# Patient Record
Sex: Male | Born: 1950 | Race: White | Hispanic: No | Marital: Married | Smoking: Never smoker
Health system: Southern US, Community
[De-identification: ages and names within clinical notes are randomized; demographics above are authoritative.]

## PROBLEM LIST (undated history)

## (undated) DIAGNOSIS — N183 Chronic kidney disease, stage 3 unspecified: Secondary | ICD-10-CM

## (undated) DIAGNOSIS — D61818 Other pancytopenia: Secondary | ICD-10-CM

## (undated) DIAGNOSIS — R569 Unspecified convulsions: Secondary | ICD-10-CM

## (undated) DIAGNOSIS — I350 Nonrheumatic aortic (valve) stenosis: Secondary | ICD-10-CM

## (undated) DIAGNOSIS — I5043 Acute on chronic combined systolic (congestive) and diastolic (congestive) heart failure: Secondary | ICD-10-CM

## (undated) DIAGNOSIS — I10 Essential (primary) hypertension: Secondary | ICD-10-CM

## (undated) DIAGNOSIS — N179 Acute kidney failure, unspecified: Secondary | ICD-10-CM

## (undated) DIAGNOSIS — I5042 Chronic combined systolic (congestive) and diastolic (congestive) heart failure: Secondary | ICD-10-CM

## (undated) DIAGNOSIS — M549 Dorsalgia, unspecified: Secondary | ICD-10-CM

## (undated) DIAGNOSIS — J90 Pleural effusion, not elsewhere classified: Secondary | ICD-10-CM

## (undated) DIAGNOSIS — G8929 Other chronic pain: Secondary | ICD-10-CM

## (undated) DIAGNOSIS — E875 Hyperkalemia: Secondary | ICD-10-CM

## (undated) DIAGNOSIS — R9431 Abnormal electrocardiogram [ECG] [EKG]: Secondary | ICD-10-CM

## (undated) DIAGNOSIS — I442 Atrioventricular block, complete: Secondary | ICD-10-CM

## (undated) DIAGNOSIS — E1140 Type 2 diabetes mellitus with diabetic neuropathy, unspecified: Secondary | ICD-10-CM

## (undated) DIAGNOSIS — M542 Cervicalgia: Secondary | ICD-10-CM

## (undated) DIAGNOSIS — M069 Rheumatoid arthritis, unspecified: Secondary | ICD-10-CM

## (undated) DIAGNOSIS — I2510 Atherosclerotic heart disease of native coronary artery without angina pectoris: Secondary | ICD-10-CM

## (undated) HISTORY — PX: CARDIAC SURGERY: SHX584

## (undated) HISTORY — PX: LEG AMPUTATION: SHX1105

## (undated) HISTORY — DX: Chronic combined systolic (congestive) and diastolic (congestive) heart failure: I50.42

## (undated) HISTORY — PX: OTHER SURGICAL HISTORY: SHX169

## (undated) HISTORY — PX: TOTAL HIP ARTHROPLASTY: SHX124

## (undated) HISTORY — PX: JOINT REPLACEMENT: SHX530

## (undated) HISTORY — DX: Nonrheumatic aortic (valve) stenosis: I35.0

## (undated) HISTORY — PX: BELOW KNEE LEG AMPUTATION: SUR23

## (undated) HISTORY — DX: Chronic kidney disease, stage 3 unspecified: N18.30

## (undated) HISTORY — PX: FRACTURE SURGERY: SHX138

---
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
## 1991-12-27 DIAGNOSIS — I2510 Atherosclerotic heart disease of native coronary artery without angina pectoris: Secondary | ICD-10-CM

## 1991-12-27 HISTORY — DX: Atherosclerotic heart disease of native coronary artery without angina pectoris: I25.10

## 1998-07-06 ENCOUNTER — Ambulatory Visit (HOSPITAL_COMMUNITY): Admission: RE | Admit: 1998-07-06 | Discharge: 1998-07-06 | Payer: Self-pay | Admitting: *Deleted

## 1998-08-13 ENCOUNTER — Ambulatory Visit (HOSPITAL_COMMUNITY): Admission: RE | Admit: 1998-08-13 | Discharge: 1998-08-13 | Payer: Self-pay | Admitting: *Deleted

## 1998-08-14 ENCOUNTER — Ambulatory Visit (HOSPITAL_COMMUNITY): Admission: RE | Admit: 1998-08-14 | Discharge: 1998-08-14 | Payer: Self-pay | Admitting: *Deleted

## 1999-09-11 ENCOUNTER — Encounter: Payer: Self-pay | Admitting: Emergency Medicine

## 1999-09-11 ENCOUNTER — Inpatient Hospital Stay (HOSPITAL_COMMUNITY): Admission: EM | Admit: 1999-09-11 | Discharge: 1999-09-15 | Payer: Self-pay | Admitting: Emergency Medicine

## 1999-09-11 ENCOUNTER — Encounter: Payer: Self-pay | Admitting: Endocrinology

## 1999-11-24 ENCOUNTER — Encounter (HOSPITAL_COMMUNITY): Admission: RE | Admit: 1999-11-24 | Discharge: 2000-02-22 | Payer: Self-pay

## 2000-01-18 ENCOUNTER — Inpatient Hospital Stay (HOSPITAL_COMMUNITY): Admission: EM | Admit: 2000-01-18 | Discharge: 2000-01-21 | Payer: Self-pay | Admitting: Emergency Medicine

## 2000-01-18 ENCOUNTER — Encounter: Payer: Self-pay | Admitting: Emergency Medicine

## 2000-02-16 ENCOUNTER — Ambulatory Visit (HOSPITAL_COMMUNITY): Admission: RE | Admit: 2000-02-16 | Discharge: 2000-02-16 | Payer: Self-pay

## 2000-02-18 ENCOUNTER — Inpatient Hospital Stay (HOSPITAL_COMMUNITY): Admission: AD | Admit: 2000-02-18 | Discharge: 2000-03-02 | Payer: Self-pay

## 2000-04-03 ENCOUNTER — Encounter: Admission: RE | Admit: 2000-04-03 | Discharge: 2000-04-03 | Payer: Self-pay | Admitting: Infectious Diseases

## 2000-04-19 ENCOUNTER — Encounter: Admission: RE | Admit: 2000-04-19 | Discharge: 2000-04-19 | Payer: Self-pay | Admitting: Infectious Diseases

## 2000-05-04 ENCOUNTER — Ambulatory Visit (HOSPITAL_COMMUNITY): Admission: RE | Admit: 2000-05-04 | Discharge: 2000-05-04 | Payer: Self-pay

## 2000-06-05 ENCOUNTER — Encounter: Admission: RE | Admit: 2000-06-05 | Discharge: 2000-06-05 | Payer: Self-pay | Admitting: Infectious Diseases

## 2000-07-03 ENCOUNTER — Encounter: Admission: RE | Admit: 2000-07-03 | Discharge: 2000-07-03 | Payer: Self-pay | Admitting: Infectious Diseases

## 2000-10-16 ENCOUNTER — Encounter: Admission: RE | Admit: 2000-10-16 | Discharge: 2001-01-14 | Payer: Self-pay

## 2001-03-25 ENCOUNTER — Encounter: Payer: Self-pay | Admitting: Emergency Medicine

## 2001-03-26 ENCOUNTER — Inpatient Hospital Stay (HOSPITAL_COMMUNITY): Admission: EM | Admit: 2001-03-26 | Discharge: 2001-03-28 | Payer: Self-pay | Admitting: Emergency Medicine

## 2001-04-06 ENCOUNTER — Encounter: Admission: RE | Admit: 2001-04-06 | Discharge: 2001-04-06 | Payer: Self-pay | Admitting: Infectious Diseases

## 2002-03-09 ENCOUNTER — Inpatient Hospital Stay (HOSPITAL_COMMUNITY): Admission: EM | Admit: 2002-03-09 | Discharge: 2002-03-14 | Payer: Self-pay | Admitting: Endocrinology

## 2002-03-09 ENCOUNTER — Encounter: Payer: Self-pay | Admitting: Emergency Medicine

## 2002-10-01 ENCOUNTER — Encounter: Payer: Self-pay | Admitting: Emergency Medicine

## 2002-10-01 ENCOUNTER — Emergency Department (HOSPITAL_COMMUNITY): Admission: EM | Admit: 2002-10-01 | Discharge: 2002-10-01 | Payer: Self-pay | Admitting: Emergency Medicine

## 2008-07-03 ENCOUNTER — Ambulatory Visit (HOSPITAL_COMMUNITY): Admission: RE | Admit: 2008-07-03 | Discharge: 2008-07-03 | Payer: Self-pay | Admitting: Family Medicine

## 2008-07-10 ENCOUNTER — Ambulatory Visit (HOSPITAL_COMMUNITY): Admission: RE | Admit: 2008-07-10 | Discharge: 2008-07-10 | Payer: Self-pay | Admitting: Family Medicine

## 2008-07-10 ENCOUNTER — Encounter (INDEPENDENT_AMBULATORY_CARE_PROVIDER_SITE_OTHER): Payer: Self-pay | Admitting: Family Medicine

## 2008-10-22 ENCOUNTER — Ambulatory Visit: Payer: Self-pay | Admitting: Gastroenterology

## 2008-10-27 ENCOUNTER — Ambulatory Visit (HOSPITAL_COMMUNITY): Admission: RE | Admit: 2008-10-27 | Discharge: 2008-10-27 | Payer: Self-pay | Admitting: Gastroenterology

## 2008-10-27 ENCOUNTER — Ambulatory Visit: Payer: Self-pay | Admitting: Gastroenterology

## 2009-05-08 ENCOUNTER — Emergency Department (HOSPITAL_COMMUNITY): Admission: EM | Admit: 2009-05-08 | Discharge: 2009-05-09 | Payer: Self-pay | Admitting: Emergency Medicine

## 2011-05-10 NOTE — Op Note (Signed)
NAME:  [name], [name] NO.:  [id number]   MEDICAL RECORD NO.:  [id number]          PATIENT TYPE:  AMB   LOCATION:  DAY                           FACILITY:  [facility]   PHYSICIAN:  Caro Hight, M.D.      DATE OF BIRTH:  April 02, 1951   DATE OF PROCEDURE:  10/27/2008  DATE OF DISCHARGE:                               OPERATIVE REPORT   REFERRING PHYSICIAN:  Halford Chessman, MD   PROCEDURE:  Colonoscopy.   INDICATIONS FOR EXAM:  Mr. Alston is a 60 year old with years of  constipation.  He is currently on narcotics for his pain from rheumatoid  arthritis.  He presents for average-risk colon cancer screening.   FINDINGS:  1. Slightly tortuous colon.  Otherwise, normal colon without evidence      of polyps, masses, inflammatory changes or diverticular AVMs.  2. Small internal hemorrhoids.  Otherwise, normal retroflexed view of      the rectum.   RECOMMENDATIONS:  1. Screening colonoscopy in 10 years.  2. He should drink 6-8 cups of water daily.  He should use Benefiber      twice a day.  He can continue to use milk of magnesia every other      day to address his constipation.  3. He should follow a high-fiber diet.  He is given a handout on high-      fiber diet and hemorrhoids.   MEDICATIONS:  MAC provided by Anesthesia.   PROCEDURE TECHNIQUE:  Physical exam was performed.  Informed consent was  obtained from the patient after explaining the benefits, risks, and  alternatives to the procedure.  The patient was connected to monitor and  placed in left lateral position.  Continuous oxygen was provided by  nasal cannula.  IV medicine administered through an indwelling cannula.  After administration of sedation and rectal exam, the patient's rectum  was intubated.  The scope  was advanced under direct visualization to the cecum.  The scope was  removed slowly by carefully examining the color, texture, anatomy, and  integrity of the mucosa on the way out.  The patient was  recovered in  Endoscopy and discharged home in satisfactory condition.      Caro Hight, M.D.  Electronically Signed     SM/MEDQ  D:  10/27/2008  T:  10/28/2008  Job:  [id number]   cc:   Halford Chessman, M.D.  Fax: [phone]

## 2011-05-10 NOTE — Consult Note (Signed)
NAME:  [name], MACHIDA NO.:  [id number]   MEDICAL RECORD NO.:  [id number]          PATIENT TYPE:  AMB   LOCATION:  DAY                           FACILITY:  [facility]   PHYSICIAN:  Caro Hight, M.D.      DATE OF BIRTH:  [birth year]   DATE OF CONSULTATION:  DATE OF DISCHARGE:                                 CONSULTATION   REASON FOR CONSULTATION:  Constipation.   PHYSICIAN REQUESTING CONSULTATION:  Halford Chessman, MD   HISTORY OF PRESENT ILLNESS:  Mr. Carion is a 59 year old Caucasian  gentleman who presents today for further evaluation of chronic  constipation.  He states he has really had a problem over the past 1  year.  He never has a bowel movement unless he takes milk of magnesia,  which he usually takes every 2-3 days.  He has tried multiple over-the-  Garment/textile technologist.  He has tried MiraLax without any results.  He is  currently on Colace 100 mg b.i.d.  He states milk of magnesia does keep  his stool soft.  He denies any blood in the stool.  Sometimes, his  stools are very dark.  He denies any abdominal pain, nausea or vomiting,  heartburn, dysphagia, odynophagia, or recent weight loss.   CURRENT MEDICATIONS:  1. Atenolol 25 mg b.i.d.  2. Furosemide 30 mg b.i.d.  3. Amitriptyline 150 mg at bedtime.  4. Prednisone 5 mg daily.  5. Docusate sodium 100 mg b.i.d.  6. Methadone 10 mg daily, 5 mg in between as needed.  7. Humalog 75/25 mg 60 units b.i.d.  8. Methotrexate 7 per week.  9. Plaquenil 40 mg b.i.d.  10.Sulfasalazine 1 mg b.i.d.   ALLERGIES:  No known drug allergies.   PAST MEDICAL HISTORY:  Diabetes mellitus; rheumatoid arthritis, followed  by Dr. Ronnald Ramp in [facility]; fibromyalgia, hypertension, history of  shingles, history of Candida bone infection in 2001 after receiving  Remicade.  He took Remicade a couple of days before an I&D of a right  axillary cyst.  He has also had a left hip replacement 3 years ago, left  hand surgery, left shoulder  surgery for a cyst.  He tells me he needs  bilateral shoulder replacements.   FAMILY HISTORY:  Mother deceased at age 72 with stroke.  Father deceased  at age 67 of history of diabetes and Alzheimer's.  No family history of  colon cancer.   SOCIAL HISTORY:  He is married.  He has 2 children.  He is on  disability.  He does not smoke.  No alcohol use.   REVIEW OF SYSTEMS:  GI:  See HPI.  CONSTITUTIONAL:  Denies any recent  weight loss.  Back in 2001, after developing Candida infection, he  states he was bedridden for about 2 years and gained up to 300 pounds.  After he become mobile, he was able to drop his weight.  He has been at  215 for some time.  CARDIOPULMONARY:  Denies chest pain, shortness of  breath, palpitations, or cough.  MUSCULOSKELETAL:  He has diffuse  musculoskeletal pain related to his rheumatoid arthritis.  He develops  pain especially in his feet and ankles, hands and shoulders.  GENITOURINARY:  Denies dysuria or hematuria.   PHYSICAL EXAMINATION:  VITALS:  Weight 215, height 5 feet and 11-1/2  inches, temp 98.3, blood pressure 120/78, and pulse 72.  GENERAL:  Pleasant, chronically ill-appearing Caucasian gentleman in no  acute distress.  SKIN:  Warm and dry.  No jaundice.  HEENT:  Sclerae nonicteric.  Oropharyngeal mucosa moist and pink.  CHEST:  Lungs are clear to auscultation.  CARDIAC:  Regular rate and rhythm.  Normal S1 and S2.  No murmurs, rubs,  or gallops.  ABDOMEN:  Positive bowel sounds.  Abdomen is soft, nontender, and  nondistended.  He has small umbilical hernia, easily reducible, and  nontender.  No rebound or guarding.  No organomegaly or masses.  No  abdominal bruits or hernias.  LOWER EXTREMITIES:  No edema.   IMPRESSION:  Mr. Silerio is a 60 year old gentleman with chronic  constipation likely secondary to drug effect.  He has been on chronic  narcotics and methadone, which is likely impeding his colonic motility.  He has never had colonic  imaging.  Recommend diagnostic colonoscopy at  this time.  I have discussed risks, alternatives, and benefits with the  patient with regards to, but not limited to the risk of reaction to  medication, bleeding, infection, and perforation.  He is agreeable to  proceed.   PLAN:  1. Colonoscopy with Dr. Stann Mainland in the near future.  2. We will retrieve recent labs done through his primary care      physician's office, Dr. Hilma Favors, for further review.   I would like to thank Dr. Hilma Favors for allowing [place] to take part in the  care of this patient.      Neil Crouch, P.A.      Caro Hight, M.D.  Electronically Signed    LL/MEDQ  D:  10/22/2008  T:  10/23/2008  Job:  [id number]   cc:   Halford Chessman, M.D.  Fax: [phone]

## 2011-05-13 NOTE — H&P (Signed)
[place]  Patient:    [name], [name] Visit Number: [id number] MRN: [id number]          Service Type: MED Location: [phone] Attending Physician:  Dwan Bolt Dictated by:   Anson Oregon, M.D. Admit Date:  03/09/2002                           History and Physical  HISTORY OF PRESENT ILLNESS:  [name] is a 60 year old disabled white male who lives with his wife, a patient of Dr. Viona Gilmore. Gareth Eagle, who came to the emergency room today as a referral from [facility].  The patient had been having increasing joint pain peripherally, especially in the hands, knees, ankles, shoulders.  In addition, he has had febrile episodes for the last few days.  In the [facility] Emergency Room, it was found that he had an elevated temperature of approximately 101.  It was felt that he could have been septic and referred to [facility] for further treatment.  The patient denies a recent history of skin rash, mouth sores, eye problem, chest or abdominal complaints, diarrhea, cough, dysuria.  Also, he has not had a significant headache or blurriness of vision.  The patient does have chronic pain involving the upper and lower extremities. He has been evaluated and treated at [facility].  He did see the pain clinic specialist approximately two to three weeks ago.  Patient does have a past medical history of diabetes mellitus, hypertension, hypercholesterolemia, ischemic heart disease.  MEDICATIONS:  He has been medicated with various medications which have included:  1. Bextra 20 mg twice a day.  2. Lantus insulin 100 units q.p.m.  3. Humalog mix 75/25, 50 units in the morning, 50 units in the evening.  4. Diflucan 200 mg once a week.  5. Zocor 40 mg before bed.  6. Methotrexate 17.5 mg weekly on Friday.  7. Sulfasalazine 500 mg twice a day.  8. Plaquenil 200 mg twice a day.  9. OxyContin 60 mg every eight hours. 10. Glucophage  XR 500 mg twice a day. 11. Atenolol 50 mg twice a day. 12. Aspirin 325 mg daily. 13. Actos 45 mg a day. 14. Folic acid 1 mg a day. 15. Lasix 20 mg twice a day. 16. Amitriptyline 100 mg before bed. 17. Prednisone 5 mg daily. 18. Librium 10 mg three times a day. 19. Mavik 4 mg before bed. 20. Neurontin 200 mg twice a day.  ALLERGIES:  The patient has had no significant allergy to medicine.  SOCIAL HISTORY:  Patient does not currently smoke cigarettes or drink alcohol.  PAST MEDICAL HISTORY:  Patient was hospitalized approximately two years ago for treatment of candidal abscess.  Since then, he has been on chronic Diflucan weekly to prevent candidiasis.  LABORATORY AND ACCESSORY DATA:  Laboratory studies from [facility] Emergency Room revealed a white cell count of 11,500, hemoglobin of 10.9, platelet count of 434,000; creatinine of 0.7, potassium of 4.3, glucose 190, alkaline phosphatase of 45, SGPT of 12, albumin of 2.9.  PHYSICAL EXAMINATION:  GENERAL:  On physical exam, the patient appears to be chronically ill, complaining of pain in the upper and lower extremities.  NECK:  Examination of the neck revealed no thyromegaly or adenopathy.  There is good mobility of the neck in all directions.  MUSCULOSKELETAL:  Muscle strength testing revealed 4+/5 strength in the biceps, triceps, deltoid  areas.  On the muscle exam, it was noted that tender points were noted in various locations including the proximal hip and shoulder muscle groups.  LUNGS:  Exam reveals clear breath sounds bilaterally without rales.  HEART:  Regular sinus rhythm, heart rate of 90.  No murmur, gallop or rub is appreciated.  ABDOMEN:  Exam revealed a soft abdomen without hepatosplenomegaly.  EXTREMITIES:  Examination of the extremities revealed fairly good hand grip bilaterally.  He had some difficulty in full abduction and forward flexion of both shoulders.  Knee exam revealed a slight effusion of the  right knee with decreased flexion of the right knee to about 90 to 100 degrees.  NEUROLOGIC:  Exam revealed good mobility of the upper and lower extremities. No obvious cognitive abnormality was noted.  He seemed to be oriented to time and place.  ASSESSMENT:  The patient has had a history of myalgia and arthralgia together with fever.  It is possible that he could be septic, in light of the fact that he is on immunotherapy.  He will be admitted for blood and urine culture as well as antibiotic therapy.  He does have a past history of rheumatoid arthritis as well as a chronic pain syndrome.  The prednisone will be continued, although the methotrexate, Azulfidine and Plaquenil will be held during the admission. Dictated by:   Anson [place], M.D. Attending Physician:  Dwan Bolt DD:  03/10/02 TD:  03/11/02 Job: [id number]

## 2011-05-13 NOTE — Discharge Summary (Signed)
[place]. [facility]  Patient:    [name], [name]                         MRN: [id number] Adm. Date:  [id number] Disc. Date: [id number] Attending:  Arlice Colt                           Discharge Summary  FINAL DIAGNOSES:  1. Urinary tract infection.  2. Insulin-dependent diabetes mellitus, poor control.  3. Rheumatoid arthritis.  4. Coronary artery disease, status post myocardial infarction, stable.  5. Hyperlipidemia.  6. Obesity.  7. 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  BRIEF HISTORY:  Mr. Niel is a complicated 60 year old male who has had a multitude of medical problems over the last several years, which has included difficult-to-control rheumatoid arthritis which has been poorly responsive to a  multitude of medications including standard therapies from sulfasalazine, methotrexate, Arava, or recently Remicade.  The latter of which did seem to induce initial response.  He has also been steroid dependent and narcotic dependent for pain control.  He has had coronary artery disease, hyperlipidemia, stent placement, MI, obesity, and a variety of other difficulties.  He was admitted in September  2000, with a febrile illness which was ill defined in terms of source.  He was elt possibly to have a bacterial source in the urine and responded apparently to antibiotics intravenously and increase temporarily in the steroids.  He then did well for awhile until this admission when he had, again, a temperature of 103, chills and nausea, some emesis but no diarrhea, abdominal pain.  There were no other focal findings of note and no cough or shortness of breath, and presented to the ER where he was evaluated initially by the ER staff.  Cultures were done of  urine, blood, etc.  He was noted to have some mild pyuria and he was started again on intravenous antibiosis.  HOSPITAL COURSE:  His hospital course was actually relatively  benign in that he  seemed to improve dramatically by the following day, possibly because of his bump in his prednisone doses before.  He had no gross physical findings of note except for his rheumatoid disease the following day.  His laboratory data was not particularly suggestive of any specific etiology except for some pyuria of low grade.  He had insulin initiated for control of his diabetes on this admission, and it was felt that he could be treated as an outpatient with further adjustment of his insulin dose and other agents for his diabetes by Dr. Wilson Singer and Dr. Justine Null. e would be completed on a course for 10 days of antibiosis appropriate for possible urinary tract infection and consideration will be given depending on his course to further searches for any source of fever.  Notably, he did not have any evidence of adenopathy, new heart murmur, embolic disease, abdominal pain, hepatosplenomegaly, etc. at the time of discharge.  DISCHARGE MEDICATIONS:  1. Cipro 500 mg b.i.d. for 7 days.  2. Humulin 70/30, 15 units before breakfast and 10 before supper.  3. Prednisone 10 mg b.i.d.  4. One enteric-coated aspirin a day.  5. Atenolol 50 mg twice a day.  6. Zocor 20 mg daily.  7. Elavil 100 mg at bedtime.  8. Glucophage XL 1000 mg before breakfast and supper.  9. Prilosec 20 mg daily. 10. Analgesic as prescribed.  DIET:  He was to follow an 1800 calorie ADA diet.  DISCHARGE INSTRUCTIONS:  He was to call if he had any recurrence of any fevers r focal problems of any type.  FOLLOWUP:  He will follow up with Dr. Justine Null in one week.  LABORATORY DATA:  His data in the hospital revealed sinus tachycardia with a left axis deviation and possible old inferior infarct.  On admission, chest x-ray showed mild cardiac enlargement with no infiltrates or pulmonary process of an acute type. His initial arterial gases revealed a pH of 7.545, pCO2 29.9, pO2 99, bicarb 26.0.  White  blood count on admission 15,900, a day later 9400.  Hemoglobin was 13 on admission and 11.5 at time of discharge.  Platelets were normal at 327,000.  He had 84% neutrophils, 80% lymphs, 4 monos, 1 eos, 1 basophil on differential.  His initial laboratory studies revealed a sodium of 128, potassium high at 5.8.  Sodium on discharge 140, potassium 4.5.  BUN 23 on admission, 10 on discharge.  Creatinine 1.2 on admission, 0.6 on discharge.  Calcium in normal range.  Total  protein 55, albumin 22.  Blood glucose was poorly controlled in the 300-400 range initially, but had fallen into 200 range by the time of discharge. Transaminases were normal and bilirubin was normal at the time of discharge.  CK was not elevated.  Troponin was not elevated.  Urinalysis did reveal glycosuria, red cells, ketonuria, and 21-50 white cells.  At this point no positive cultures have been  obtained from the urine or from the blood cultures by the time of discharge.  It was felt that he had clearly improved clinically but that he was presenting ith a somewhat obscure illness which was troublesome for other etiologies other than simple infections.  He had been on a variety of immunosuppressants for a long time frame, had poorly controlled diabetes, and additionally had had agents which conceivably could have been associated with a loss of tumor surveillance, as well as immunocompetence for infections.  It was felt to be appropriate to follow him up as an outpatient given his rapid improvement, with antibiotics and simple fluids, but to consider full CT and abdominal scanning for possible hidden lymphoma. Appropriate other cultures of other types if indicated, and careful followup which will be done in the office in one week.  He seemed to understand this well and as discharged with those instructions. DD:  02/25/00 TD:  02/28/00 Job: [id number]

## 2011-05-13 NOTE — Discharge Summary (Signed)
[place]. [facility]  Patient:    [name], [name]. Visit Number: [id number] MRN: [id number]          Service Type: Attending:  W. Thomos Lemons, M.D. Dictated by:   W. Thomos Lemons, M.D. Adm. Date:  03/09/02 Disc. Date: 03/14/02                             Discharge Summary  FINAL DIAGNOSES:  1. Peripheral neuropathy with neuropathic leg pain.  2. Degenerative disk disease with spinal stenosis and lumbar degenerative     disk disease.  3. Insulin-dependent diabetes mellitus, poorly controlled.  4. Rheumatoid arthritis.  5. Coronary artery disease.  6. Hypertension.  7. Hypercholesterolemia.  8. Obesity.  9. Remote history of Candida abscess of soft tissue, left thigh. 10. History of angioplasty for coronary artery disease. 11. Chronic pain syndrome, followed at pain clinic at [facility] at [facility].  BRIEF HISTORY:  [name] is a 60 year old gentleman with a multitude of medical problems, who was admitted by Dr. Anson Oregon about 11:30 p.m. on March 09, 2002 with a history of increasing pain in general for one to two weeks with a questionable fever, with no other localizing signs.  He had complained of pain in his legs as well as his back and was felt to be possibly having a urinary tract infection or other symptoms and was admitted by Dr. Marveen Reeks for that reason, although he did not have a persistence of fever after his admission.  He was started on IV antibiotics on his admission, however.  HOSPITAL COURSE:  His hospital course was relatively benign for Mr. Seacat and in fact, he developed no signs of any infection in the blood stream or urine, based on cultures or on chest x-rays or on any other location.  Bone scan was done in the hospitalization and did not show any localization in the legs, thighs or back suggesting any bone or soft tissue infection of an occult type or fungal type.  In fact, he did fine once  antibiotics were discontinued simply with intravenous morphine sulfate and his complaints, as his history was taken over several ______ each day, seemed to be more neuropathic pain in his legs, again more than low back pain, compatible more with his diabetic neuropathy, which has been well-documented secondary to very poor diabetic control. 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  He had an obvious peripheral neuropathy on exam of the lower extremities, but no other major findings of note were noted.  He has been evaluated in detail at pain clinic at [facility] and it was felt as he improved with adequate analgesia use and time to observe him in the hospital, there was nothing to suggest any ongoing infectious process or any new basic disease state beyond what was already known.  It was felt that he would be stable for discharge on OxyContin, which has been increased to 80 mg q.8h., and on Neurontin, increased to 300 mg b.i.d. and 600 mg q.h.s., until he was seen by a pain clinic at [facility] and will be followed by [place] in six to eight weeks.  DISCHARGE MEDICATIONS:  1.  OxyContin 80 mg every eight hours.  2. Neurontin 300 mg one twice a day and two at bedtime.  3. Prednisone 10 mg each morning.  4. ASA one a day -- enteric coated.  5. Lasix 20 mg twice a day.  6. Atenolol 50 mg b.i.d.  7. Glucophage XR 500 one b.i.d.  8. Actos 45 mg q.a.m.  9. Mavik 40 mg q.h.s. 10. Tylenol p.r.n. 11. Librium 10 mg t.i.d. 12. Elavil 100 mg q.h.s. 13. Insulin coverage as directed previously.  LABORATORY AND ACCESSORY DATA:  His EKG showed an old inferior infarct but no acute change.  Chest x-ray showed mild early atelectasis of the right lung but no  other changes of note.  His sed rate was 47.  His white count was 11,500, hemoglobin 10.6, platelets 421,000.  He had a relatively normal differential of his white count.  His liver functions were normal.  Sodium was 139, potassium 4.8, chloride 100, CO2 31 and glucose ranged in the 200s for the most part.  BUN 10, creatinine 0.8, calcium 9.4, albumin 2.7, total protein 6.5.  CK and CK-MB were not elevated. TSH was normal at 0.527.  PSA was 0.53.   As mentioned, blood cultures and urine cultures were all unremarkable with no growth.  CONDITION ON DISCHARGE:  Basically, he had no further fever following his hospitalization and was discharged afebrile, off antibiotics, with improvement in his pain control and change in his medication, with a followup prearranged at pain clinic at [facility].  He was also incidentally to continue his methotrexate as an outpatient in the same manner as before.  Again, this was discussed in detail with Mr. Ellett and unfortunately, his massive obesity and difficulty with diabetic control compound any hopes of ever controlling any of his long-term deterioration in terms of his neuropathy, predisposition to infection or more aggressive treatment and ambulation for him.  He is developing substantial definitive changes in his knees and in spite of maximum efforts by his physicians and hopefully by himself, his weight has remained a major problem.  This has been the case, even though we have decreased the prednisone to 10 mg a day or less, which we will continue to work on.  His long-term prognosis unfortunately is probably fairly poor because of his multitude of problems including his coronary artery disease, lipid problems, diabetes, etc, more than his rheumatoid arthritis. Chronic pain control remains a major goal for him and a difficulty for him. Dictated by:   W. Thomos Lemons, M.D. Attending:  W. Thomos Lemons, M.D. DD:  04/29/02 TD:  05/02/02 Job:  [id number]

## 2011-05-13 NOTE — H&P (Signed)
[place]. [facility]  Patient:    [name], [name]                         MRN: [id number] Adm. Date:  [id number] Attending:  Molpus, Karen Chafe CC:         Jeanie Cooks, M.D.  Roswell Miners, M.D.   History and Physical  DATE OF BIRTH: 08-20-1951  CHIEF COMPLAINT: [name] is a 60 year old married white male with multiple problems, who was in his usual state of health until about 3 p.m. on March 25, 2001 when he developed rather sudden onset of diffuse abdominal pain which he described as being sharp and unbearable.  HISTORY OF PRESENT ILLNESS: He had some associated nausea but no vomiting.  He denied any fever, chills, or diarrhea.  He had had a normal bowel movement about one hour prior to the onset of pain.  He denied any dysuria or urinary frequency.  The patient presented at [facility] in acute severe abdominal pain.  He underwent a CT scan without IV or oral contrast.  I have had a chance to review this scan here.  The scan is fairly unremarkable except for what is probably a cyst off the left kidney as well as a slightly dilated isolated segment of mid ileum.  Of note was the absence of any free air, small bowel obstruction, tumor, aneurysm.  The appendix looked okay.  There was no acute process.  A dilated stomach was noted.  The laboratories were unremarkable.  The patient received 15 mg of morphine sulfate IV in divided doses to control his pain.  An NG tube was placed which apparently drained 400-500 ml while in [place], [place] and the tube has drained another 400 ml here.  Because of the patients previous problems and the fact that all of his physicians are in [place], [place], and the fact that the family requested that he be transported to [place], [place] for care the patient came to [employer] Room by ambulance late the evening of March 25, 2001.   The patient states that he has had some very mild intermittent abdominal pains in the past but no episodes of severe pain such as what he experienced this past evening.  PAST MEDICAL HISTORY:  1. Insulin-dependent diabetes mellitus the past couple of years.  2. Hypertension since 1993.  3. Acute MI in March 1993 with angioplasty at that time and again in March     of 1995.  The patients cardiologist is Dr. Mar Daring.  4. Hypercholesterolemia.  5. Severe rheumatoid arthritis for the past 12 years, steroid and narcotic     dependent.  6. Peripheral vascular disease.  7. Fibromyalgia.  8. Episode of systemic Candidiasis with soft tissue abscesses and     pyelonephritis treated at [facility] from February 18, 2000 through March 02, 2000 with inpatient admission.  This was his     most recent hospital admission.  The patient was felt to be     immunosuppressed due to therapies for his rheumatoid arthritis at that     time.  He was seen by Dr. Everlene Balls.  He has had surgery on his right     axilla preceding this episode of systemic Candidiasis.  9. Remote fracture of left foot.  ALLERGIES: No known drug allergies.  CURRENT MEDICATIONS:  1. Methotrexate 17.5 mg q.week.  2. Glucophage XR 500 mg t.i.d.  3. Enteric-coated aspirin 81 mg q.d.  4. Plaquenil 200 mg b.i.d.  5. Actos 45 mg q.d.  6. Lasix 20 mg q.d.  7. Zocor 40 mg q.d.  8. Prednisone 15 mg in the morning, 2 mg in evening.  9. OxyContin 40 mg q.8h 10. OxyIR 5 mg q.i.d. 11. Sulfasalazine 1000 mg b.i.d. 12. Atenolol 50 mg b.i.d. 13. Elavil 100 mg h.s. 14. Humalog insulin 75/25 50 units in the morning, 50 units in evening. 15. Mavik 2 mg q.d. 16. Nitrostat 0.4 mg p.r.n. 17. Lantus insulin 30 units h.s.  FAMILY HISTORY: Positive for hypertension and diabetes.  SOCIAL HISTORY: No history of cigarette smoking, alcohol use, or any blood transfusions.  The patient has been married for about 31 years  and lives in [place], [place] with his wife.  He has been disabled for about ten years and previously had worked as a Theatre manager.  REVIEW OF SYSTEMS: Positive for constipation and about a 50 pound weight gain over the past several months.  Chronic stiffness with sometimes inability to get out of bed.  The patient walks with a cane when he is mobile. He has had some swelling of the right knee and he has peripheral edema.  PHYSICAL EXAMINATION:  GENERAL: He is an obese man and looks ill with diaphoresis.  He has a nasogastric tube in his left nostril which has so far drained 400 ml of fluid. He has an IV going.  VITAL SIGNS: Pulse 136 with regular sinus rhythm, respiratory rate 20 and unlabored, blood pressure 145/96, temperature 97.9 degrees.  Oxygen saturation on room air 96%.  HEENT: Atraumatic.  Face is cushingoid.  No scleral icterus.  Pupillary and extraocular movements are normal.  Mouth and pharynx benign.  He has upper and lower dentures.  NECK: Quite obese, with no adenopathy or bruits.  LUNGS: Essentially clear with a few inspiratory rales of the right base.  CARDIAC: Without murmur or rub.  ABDOMEN: Quite distended, massively obese,, with stretch marks.  There is ecchymosis in the right lower quadrant from the patients insulin injections. The abdomen is resistant firm but essentially nontender without any rebound. Bowel sounds are present and active.  There is a small umbilical hernia.  EXTREMITIES: He has 1-2+ pitting edema of the lower legs, ankles, and feet. No clubbing, palmar erythema.  NEUROLOGIC: Examination grossly normal without any focal findings.  The patient is somewhat lethargic but appropriate and oriented.  LABORATORY DATA: Laboratory data from [facility] includes a CBC showing a hemoglobin of 16.2, hematocrit 48.4, WBC 16.5; however, the patient  is on prednisone; platelets 344,000; 69% polys, 16% bands, 11%  lymphocytes, 3% monocytes, 1% eosinophils.  Cardiac enzymes and CPK are all negative.  Amylase 49, lipase 142.  Chemistries show normal electrolytes with BUN of 15, creatinine 0.8, glucose 130.  Albumin 3.6.  Liver function tests normal.  EKG shows no acute changes.  CT scan of the abdomen and pelvis was reviewed as described above.  Abdominal series here at [facility] showed questionable ileus.  Urinalysis was basically benign with specific gravity 1.020, pH 6.0; a couple of wbc/hpf and rbc/hpf.  IMPRESSION/PLAN:  1. Acute abdominal pain of uncertain etiology.  Possibilities include acute     gastroparesis secondary to diabetes, vascular insult, perhaps bowel spasm,     transient volvulus or intussusception.  2. Rheumatoid arthritis.  3. Insulin-dependent diabetes mellitus.  4. Hypertension.  5. History of coronary artery disease with previous myocardial infarction.  6. Hyperlipidemia.  7. Peripheral vascular disease.  8. Fibromyalgia.  9. History of systemic Candidiasis. 10. Narcotic dependency.  PLAN: The patient will be admitted to a telemetry bed.  He may require surgical or GI consultation in the morning if there is no improvement. Tonight we will continue nasogastric tube drainage, replacement IVs, monitoring of his capillary sugars, NPO status, Regular insulin coverage, IV morphine as-needed.  We will check CBC and chemistries in the morning. DD:  03/26/01 TD:  03/26/01 Job: [id number]

## 2011-05-13 NOTE — Consult Note (Signed)
[place]. [facility]  Patient:    [name], [name]                         MRN: [id number] Proc. Date: 02/19/00 Adm. Date:  [id number] Attending:  Arlice Colt                          Consultation Report  HISTORY OF PRESENT ILLNESS:  Mr. Rumbley is a 60 year old that I am seeing at the  request of Dr. Kristen Loader by way of my partner, Dr. Shellia Carwin.  He was admitted yesterday with severe left ankle pain unrefractive to narcotics at home.  Dr. Darlis Loan admission note is reviewed.  Mr. Mazzucco is a combination rheumatoid arthritic and diabetic on steroids and insulin and has had two admissions in the past year for temperatures of 103 with no straight etiology.  He had an axillary abscess drained about a month ago and has presented with a mass in the left proximal thigh and a very painful tender area about the left ankle.  His chief complaint at this time is the left ankle, which is actually lateral Achilles tendon insertion area, which is the most painful, and the upper thigh ass is not particularly bothersome at this point.  He is on 40 mg of OxyContin every eight hours and intermittent 4 mg of morphine every two hours for the pain, but Dr. Darlis Loan notes reflect that he has been narcotic-dependent for some time, so he probably has a very low threshold.  Physical exam of the thigh reveals the lump or mass, egg-shaped, not particularly tender, and not warm.  No erythema.  No fluid is felt within it.  Examination of the left Achilles area is just the opposite.  There is a 1.5 x 1.5 inch area of  erythema and exquisite tenderness to touch and discomfort on any movement of the Achilles up and down.  He has had MRIs done of both areas and the MRI of the ankle reveals subcutaneous deep fat inflammatory process but some extension to the flexor hallucis longus muscle with no tendon disruption and nothing about the Achilles  tendon.  I think what we are dealing  with here is a soft tissue infection in an  area that needs some IV antibiotics.  I was asked to see for possible biopsy of  this or the other spot and I do not think putting a needle in here is going to elp this much and may actually irritate some.  Examination of the thigh reveals this mass, as mentioned above, and I reviewed he MRI report on that, which goes along with an area within the left vastus lateralis muscle, most likely reflecting a myositis with post-traumatic injury.  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  Following this, I called Dr. Justine Null and spoke with Dr. Wilson Singer, who was taking his calls.  I discussed the patient with him.  He felt we could start antibiotics but we should get a blood culture ahead of time.  Blood cultures had been ordered if his temperature got over 101 and he has been 98 since he has been in the hospital, so no blood cultures have been done, and at Dr. America Brown request I  did request for a single blood culture and we are going to go ahead and start 2 g of Kefzol every  eight hours, as infectious disease has recommended, and get some K-thermia pads and hot compresses to that ankle and let us see over the next 24-48 hours if we can get his pain better.  If this ankle area will try to localize or form to an abscess, then surgical intervention may be warranted.  Thank you for the consultation.  ADDENDUM:  The axillary abscess area is well-healed and no problems there.  He pointed to a little small furuncle on the left wrist, which is also bothersome.  DD:  02/19/00 TD:  02/19/00 Job: [id number]

## 2011-05-13 NOTE — Consult Note (Signed)
[place]. [facility]  Patient:    [name], [name]                         MRN: [id number] Proc. Date: 02/25/00 Adm. Date:  [id number] Disc. Date: [id number] Attending:  Arlice Colt                          Consultation Report  REQUESTED BY:  Roswell Miners, M.D.  REASON FOR CONSULTATION:  Possible yeast pyelonephritis.  BRIEF HISTORY:  This 60 year old white male with multiple medical problems including rheumatoid arthritis, was admitted on February 18, 2000 with pain and a mass in his left thigh and left ankle.  Biopsies of these subsequently showed yeast, probably Candida.  His CT scan showed bilateral diffuse infiltrates in both kidneys and what looks like a benign mass.  His liver is okay, as are his lungs. He has no urinary symptoms; was told a month ago when he saw Dr. Judeth Horn -- in January -- who drained an abscess in his right axilla, that he had a "kidney infection."  The mass was I&Dd in the office and his wife said it was draining he same kind of material that was drained out of his left ankle and left thigh biopsies that he has had recently; he still has drainage in these.  He is having no irritative bladder symptoms and his kidney function is normal, with a BUN of 14 and creatinine of 0.8.  His other medical problems include insulin-dependent diabetes mellitus, rheumatoid arthritis, on long-term dose of steroids, cardiovascular disease -- stable, and narcotic dependence.  PHYSICAL EXAMINATION:  GENERAL:  He is afebrile.  He is lying quietly in bed with his left leg elevated on a pillow.  ABDOMEN:  Somewhat firm and hard, probably from constipation, but no masses.  BACK:  No CVA pain.  AXILLAE:  The region in his right axilla seems to be healed except for a little bit of granulation tissue which is still palpable.  GU AND RECTAL:  Testes, genitalia and prostate are all negative.  IMPRESSION: 1. Probable  pyelonephritis, diffuse, from disseminated yeast, probably Candida. 2. Insulin-dependent diabetes mellitus. 3. Rheumatoid arthritis. 4. Coronary artery disease. 5. Narcotic dependence.  RECOMMENDATION:  Dr. Arelia Longest. Michelle Nasuti. was going to treat him for at least a month with Diflucan.  Fluconazole does have the advantage of being excreted through the kidneys, so this should take care of this problem as well.  His kidneys would be a good site for disseminated yeast but should respond to this treatment.  I would suggest repeating the CT scan in about two to three months but since he is asymptomatic from this, I think the treatment with Diflucan would be sufficient. DD:  02/25/00 TD:  02/25/00 Job: [id number]

## 2011-05-13 NOTE — Procedures (Signed)
[facility]  Patient:    [name], [name]                         MRN: [id number] Proc. Date: 03/27/01 Adm. Date:  [id number] Attending:  Arlice Colt                           Procedure Report  PROCEDURE:  Upper endoscopy.  INDICATIONS FOR PROCEDURE:  Abdominal pain.  ANESTHESIA:  Demerol 80 mg, Versed 10 mg.  DESCRIPTION OF PROCEDURE:  With the patient mildly sedated in the left lateral decubitus position, the Olympus video endoscope was inserted in the mouth and passed under direct vision through the esophagus following the nasogastric tube into the stomach. The fundus, body, antrum, duodenal bulb and second portion of the duodenum were all well visualized. Photographs were taken. From this point, the endoscope was slowly withdrawn taking circumferential views of the entire duodenal mucosa until the endoscope was then pulled back into the stomach and placed in retroflexion to view the stomach from below. The endoscope was then straightened and withdrawn taking circumferential views of the entire gastric and subsequently esophageal mucosa which otherwise appeared normal. The patients vital signs and pulse oximeter remained stable. The patient tolerated the procedure well without apparent complications.  FINDINGS:  Erosions from nasogastric tube suction otherwise an unremarkable endoscopic examination.  PLAN:  Discontinue nasogastric tube, begin feeding. The patient states he is no longer having abdominal pain. He has a headache, his knees and feet ache. D:  03/27/01 TD:  03/27/01 Job: [id number]

## 2011-05-13 NOTE — Op Note (Signed)
[place]. [facility]  Patient:    [name], [name]                         MRN: [id number] Proc. Date: 02/22/00 Adm. Date:  [id number] Attending:  Arlice Colt                           Operative Report  PREOPERATIVE DIAGNOSES: 1. Ischemic myositis, left thigh. 2. Soft tissue infection about the lateral aspect of the ankle with localizing    area.  POSTOPERATIVE DIAGNOSES:  Abscess of left anterolateral thigh and left lateral ankle.  PROCEDURE:  Incision and drainage of both abscesses with specimen sent for culture and smears for aerobic and anaerobic fungi and acid-fast bacillus.  DESCRIPTION OF PROCEDURE:  After suitable general anesthesia, both areas were prepped with DuraPrep, and a one-inch incision was made over the anterolateral thigh in anticipation of doing a muscle biopsy which on gently opening extruded  pus, cultures were taken.  The cavity was about the size of a small egg.  It was evacuated with a suction device and a quarter-inch rubber drain left in place sutured to one edge and three additional sutures to reduce the size of the stab  wound down to one-half inch.  Then, we went down to the left ankle.  There is a  brawny area posterior to the lateral malleolus.  A linear incision was made through this thickened brawny area.  The same appearing pus extruded.  All the same specimens were sent.  This one was irrigated with a little saline, and then we eft the suture to drain in a similar fashion.  Compression dressing applied, and then he goes to recovery in good condition. DD:  02/22/00 TD:  02/22/00 Job: [id number]

## 2011-09-27 LAB — POCT I-STAT 4, (NA,K, GLUC, HGB,HCT): Glucose, Bld: 131 — ABNORMAL HIGH

## 2012-04-14 ENCOUNTER — Emergency Department (HOSPITAL_COMMUNITY)
Admission: EM | Admit: 2012-04-14 | Discharge: 2012-04-14 | Disposition: A | Discharge status: Home / Self Care | Payer: Medicare Other | Attending: Emergency Medicine | Admitting: Emergency Medicine

## 2012-04-14 ENCOUNTER — Emergency Department (HOSPITAL_COMMUNITY): Payer: Medicare Other

## 2012-04-14 ENCOUNTER — Encounter (HOSPITAL_COMMUNITY): Payer: Self-pay | Admitting: *Deleted

## 2012-04-14 DIAGNOSIS — R05 Cough: Secondary | ICD-10-CM | POA: Insufficient documentation

## 2012-04-14 DIAGNOSIS — R51 Headache: Secondary | ICD-10-CM | POA: Insufficient documentation

## 2012-04-14 DIAGNOSIS — I2510 Atherosclerotic heart disease of native coronary artery without angina pectoris: Secondary | ICD-10-CM | POA: Insufficient documentation

## 2012-04-14 DIAGNOSIS — R059 Cough, unspecified: Secondary | ICD-10-CM | POA: Insufficient documentation

## 2012-04-14 DIAGNOSIS — Z794 Long term (current) use of insulin: Secondary | ICD-10-CM | POA: Insufficient documentation

## 2012-04-14 DIAGNOSIS — I10 Essential (primary) hypertension: Secondary | ICD-10-CM | POA: Insufficient documentation

## 2012-04-14 DIAGNOSIS — R509 Fever, unspecified: Secondary | ICD-10-CM | POA: Insufficient documentation

## 2012-04-14 DIAGNOSIS — E119 Type 2 diabetes mellitus without complications: Secondary | ICD-10-CM | POA: Insufficient documentation

## 2012-04-14 DIAGNOSIS — J40 Bronchitis, not specified as acute or chronic: Secondary | ICD-10-CM

## 2012-04-14 HISTORY — DX: Atherosclerotic heart disease of native coronary artery without angina pectoris: I25.10

## 2012-04-14 HISTORY — DX: Essential (primary) hypertension: I10

## 2012-04-14 MED ORDER — CEFTRIAXONE SODIUM 1 G IJ SOLR
1.0000 g | Freq: Once | INTRAMUSCULAR | Status: AC
  Administered 2012-04-14: 1 g via INTRAMUSCULAR
  Filled 2012-04-14: qty 10

## 2012-04-14 MED ORDER — HYDROCOD POLST-CHLORPHEN POLST 10-8 MG/5ML PO LQCR
ORAL | Status: AC
  Administered 2012-04-14: 5 mL via ORAL
  Filled 2012-04-14: qty 5

## 2012-04-14 MED ORDER — ACETAMINOPHEN 500 MG PO TABS
1000.0000 mg | ORAL_TABLET | Freq: Once | ORAL | Status: AC
  Administered 2012-04-14: 1000 mg via ORAL

## 2012-04-14 MED ORDER — HYDROCOD POLST-CHLORPHEN POLST 10-8 MG/5ML PO LQCR
5.0000 mL | Freq: Once | ORAL | Status: AC
  Administered 2012-04-14: 5 mL via ORAL

## 2012-04-14 MED ORDER — GUAIFENESIN-CODEINE 100-10 MG/5ML PO SYRP: ORAL_SOLUTION | ORAL | Status: DC

## 2012-04-14 MED ORDER — AZITHROMYCIN 250 MG PO TABS
500.0000 mg | ORAL_TABLET | Freq: Once | ORAL | Status: AC
  Administered 2012-04-14: 500 mg via ORAL
  Filled 2012-04-14: qty 2

## 2012-04-14 MED ORDER — AZITHROMYCIN 250 MG PO TABS: ORAL_TABLET | ORAL | Status: AC

## 2012-04-14 MED ORDER — ACETAMINOPHEN 500 MG PO TABS
ORAL_TABLET | ORAL | Status: AC
  Administered 2012-04-14: 1000 mg via ORAL
  Filled 2012-04-14: qty 2

## 2012-04-14 NOTE — Discharge Instructions (Signed)
Bronchitis Bronchitis is a problem of the air tubes leading to your lungs. This problem makes it hard for air to get in and out of the lungs. You may cough a lot because your air tubes are narrow. Going without care can cause lasting (chronic) bronchitis. HOME CARE   Drink enough fluids to keep your pee (urine) clear or pale yellow.   Use a cool mist humidifier.   Quit smoking if you smoke. If you keep smoking, the bronchitis might not get better.   Only take medicine as told by your doctor.  GET HELP RIGHT AWAY IF:   Coughing keeps you awake.   You start to wheeze.   You become more sick or weak.   You have a hard time breathing or get short of breath.   You cough up blood.   Coughing lasts more than 2 weeks.   You have a fever.   Your baby is older than 3 months with a rectal temperature of 102 F (38.9 C) or higher.   Your baby is 16 months old or younger with a rectal temperature of 100.4 F (38 C) or higher.  MAKE SURE YOU:  Understand these instructions.   Will watch your condition.   Will get help right away if you are not doing well or get worse.  Document Released: 05/30/2008 Document Revised: 12/01/2011 Document Reviewed: 11/13/2009 [facility] Patient Information 2012 [place].  Take the meds as directed.  Follow up your MD as needed.

## 2012-04-14 NOTE — ED Provider Notes (Signed)
Medical screening examination/treatment/procedure(s) were performed by non-physician practitioner and as supervising physician I was immediately available for consultation/collaboration.   Maudry Diego, MD 04/14/12 [phone]

## 2012-04-14 NOTE — ED Notes (Signed)
Pt DC to home with steady gait 

## 2012-04-14 NOTE — ED Provider Notes (Signed)
History     CSN: [id number]  Arrival date & time 04/14/12  78   First MD Initiated Contact with Patient 04/14/12 1454      Chief Complaint  Patient presents with  . Cough  . Headache    (Consider location/radiation/quality/duration/timing/severity/associated sxs/prior treatment) HPI Comments: H/o rheumatoid arthritis.  Has PCP in [place].  Also, has frontal headache "from coughing so much"  Patient is a 61 y.o. male presenting with cough and headaches. The history is provided by the patient. No language interpreter was used.  Cough This is a new problem. The problem occurs constantly. The cough is productive of sputum. The maximum temperature recorded prior to his arrival was 100 to 100.9 F. Associated symptoms include headaches. Pertinent negatives include no sore throat, no shortness of breath and no wheezing. Treatments tried: mucinex. The treatment provided mild relief. He is not a smoker. His past medical history is significant for bronchitis.  Headache  Associated symptoms include a fever. Pertinent negatives include no shortness of breath.    Past Medical History  Diagnosis Date  . Arthritis   . Coronary artery disease   . Diabetes mellitus   . Hypertension     Past Surgical History  Procedure Date  . Joint replacement   . Fracture surgery   . Cardiac surgery     No family history on file.  History  Substance Use Topics  . Smoking status: Never Smoker   . Smokeless tobacco: Not on file  . Alcohol Use: No      Review of Systems  Constitutional: Positive for fever.  HENT: Negative for sore throat.   Respiratory: Positive for cough. Negative for shortness of breath and wheezing.   Neurological: Positive for headaches.  All other systems reviewed and are negative.    Allergies  Review of patient's allergies indicates no known allergies.  Home Medications   Current Outpatient Rx  Name Route Sig Dispense Refill  . AMITRIPTYLINE HCL 50 MG PO  TABS Oral Take 150 mg by mouth at bedtime.    . ASPIRIN 81 MG PO CHEW Oral Chew 81 mg by mouth daily.    . ATENOLOL 25 MG PO TABS Oral Take 25 mg by mouth 2 (two) times daily.    . ATORVASTATIN CALCIUM 80 MG PO TABS Oral Take 80 mg by mouth daily.    . FUROSEMIDE 20 MG PO TABS Oral Take 20 mg by mouth 2 (two) times daily.    [name] GABAPENTIN 300 MG PO CAPS Oral Take 300 mg by mouth 3 (three) times daily.    [name] HYDROXYCHLOROQUINE SULFATE 200 MG PO TABS Oral Take 200 mg by mouth 2 (two) times daily.    . INSULIN LISPRO PROT & LISPRO (75-25) 100 UNIT/ML [place] SUSP Subcutaneous Inject 12-20 Units into the skin 2 (two) times daily with a meal. Take 20 units in the morning and 12 units each night    . METHADONE HCL 10 MG PO TABS Oral Take 10 mg by mouth 4 (four) times daily.    [name] METHADONE HCL 5 MG PO TABS Oral Take 5 mg by mouth as needed. For breakthrough pain    . PREDNISONE 5 MG PO TABS Oral Take 5 mg by mouth daily.    . SULFASALAZINE 500 MG PO TABS Oral Take 500 mg by mouth 2 (two) times daily.    [name] TAMSULOSIN HCL 0.4 MG PO CAPS Oral Take 0.4 mg by mouth daily.    . AZITHROMYCIN 250 MG  PO TABS  One tab po QD (initial dose given in the ED) 4 each 0  . GUAIFENESIN-CODEINE 100-10 MG/5ML PO SYRP  10 mls po q 4-6 hrs prn cough 240 mL 0    BP 114/71  Pulse 74  Temp(Src) 98.1 F (36.7 C) (Oral)  Resp 20  Ht 5\' 11"  (1.803 m)  Wt 200 lb (90.719 kg)  BMI 27.89 kg/m2  SpO2 96%  Physical Exam  Nursing note and vitals reviewed. Constitutional: He is oriented to person, place, and time. He appears well-developed and well-nourished.  HENT:  Head: Normocephalic and atraumatic.  Eyes: EOM are normal.  Neck: Normal range of motion.  Cardiovascular: Normal rate, regular rhythm, normal heart sounds and intact distal pulses.   Pulmonary/Chest: Effort normal and breath sounds normal. No accessory muscle usage. Not tachypneic. No respiratory distress. He has no decreased breath sounds. He has no wheezes. He has  no rhonchi. He has no rales. He exhibits no tenderness.  Abdominal: Soft. He exhibits no distension. There is no tenderness.  Musculoskeletal: Normal range of motion.  Lymphadenopathy:    He has no cervical adenopathy.  Neurological: He is alert and oriented to person, place, and time. He has normal strength. No cranial nerve deficit or sensory deficit. Coordination and gait normal. GCS eye subscore is 4. GCS verbal subscore is 5. GCS motor subscore is 6.  Skin: Skin is warm and dry.  Psychiatric: He has a normal mood and affect. Judgment normal.    ED Course  Procedures (including critical care time)  Labs Reviewed - No data to display Dg Chest 2 View  04/14/2012  *RADIOLOGY REPORT*  Clinical Data: Cough and headache.  History of bronchitis. Congestion.  History of MI.  CHEST - 2 VIEW  Comparison: 07/03/2008  Findings: Heart size is normal.  There are no focal consolidations or pleural effusions.  There is perihilar peribronchial thickening. Marked degenerate changes are seen in both shoulders with subacromial narrowing bilaterally.  IMPRESSION:  1.  Bronchitic change. 2. No focal pulmonary abnormality. 3.  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  Original Report Authenticated By: Glenice Bow, M.D.     1. Bronchitis       MDM  Will tx the severe bronchitis as a community acquired pneumonia.   Rocephin 1000 mg IM zithromax 500 mg po and rx for 4 tabs rx-tussionexf/u with your MD or return to ED prn        Duaine Dredge, PA 04/14/12 [place], PA 04/14/12 1554

## 2012-04-14 NOTE — ED Notes (Signed)
Pt states has had a productive cough x 2-3 weeks, low grade fever and headache.

## 2012-12-10 ENCOUNTER — Emergency Department (HOSPITAL_COMMUNITY): Payer: Medicare Other

## 2012-12-10 ENCOUNTER — Observation Stay (HOSPITAL_COMMUNITY)
Admission: EM | Admit: 2012-12-10 | Discharge: 2012-12-11 | Disposition: A | Discharge status: Home / Self Care | Payer: Medicare Other | Attending: Internal Medicine | Admitting: Internal Medicine

## 2012-12-10 ENCOUNTER — Encounter (HOSPITAL_COMMUNITY): Payer: Self-pay | Admitting: *Deleted

## 2012-12-10 DIAGNOSIS — M069 Rheumatoid arthritis, unspecified: Secondary | ICD-10-CM | POA: Diagnosis present

## 2012-12-10 DIAGNOSIS — E1129 Type 2 diabetes mellitus with other diabetic kidney complication: Secondary | ICD-10-CM | POA: Diagnosis present

## 2012-12-10 DIAGNOSIS — M869 Osteomyelitis, unspecified: Secondary | ICD-10-CM | POA: Diagnosis present

## 2012-12-10 DIAGNOSIS — I10 Essential (primary) hypertension: Secondary | ICD-10-CM | POA: Diagnosis present

## 2012-12-10 DIAGNOSIS — E785 Hyperlipidemia, unspecified: Secondary | ICD-10-CM | POA: Diagnosis present

## 2012-12-10 DIAGNOSIS — E1149 Type 2 diabetes mellitus with other diabetic neurological complication: Secondary | ICD-10-CM

## 2012-12-10 DIAGNOSIS — Z792 Long term (current) use of antibiotics: Secondary | ICD-10-CM | POA: Insufficient documentation

## 2012-12-10 DIAGNOSIS — I2510 Atherosclerotic heart disease of native coronary artery without angina pectoris: Secondary | ICD-10-CM | POA: Diagnosis present

## 2012-12-10 DIAGNOSIS — E1169 Type 2 diabetes mellitus with other specified complication: Principal | ICD-10-CM | POA: Insufficient documentation

## 2012-12-10 DIAGNOSIS — M9080 Osteopathy in diseases classified elsewhere, unspecified site: Secondary | ICD-10-CM | POA: Insufficient documentation

## 2012-12-10 HISTORY — DX: Type 2 diabetes mellitus with diabetic neuropathy, unspecified: E11.40

## 2012-12-10 HISTORY — DX: Rheumatoid arthritis, unspecified: M06.9

## 2012-12-10 LAB — BASIC METABOLIC PANEL
BUN: 15 mg/dL (ref 6–23)
Chloride: 98 mEq/L (ref 96–112)
Glucose, Bld: 170 mg/dL — ABNORMAL HIGH (ref 70–99)
Potassium: 4.5 mEq/L (ref 3.5–5.1)

## 2012-12-10 LAB — CBC WITH DIFFERENTIAL/PLATELET
HCT: 38.3 % — ABNORMAL LOW (ref 39.0–52.0)
Hemoglobin: 13 g/dL (ref 13.0–17.0)
Lymphocytes Relative: 27 % (ref 12–46)
Monocytes Absolute: 0.5 10*3/uL (ref 0.1–1.0)
Monocytes Relative: 7 % (ref 3–12)
Neutro Abs: 4.3 10*3/uL (ref 1.7–7.7)
WBC: 6.9 10*3/uL (ref 4.0–10.5)

## 2012-12-10 LAB — GLUCOSE, CAPILLARY: Glucose-Capillary: 207 mg/dL — ABNORMAL HIGH (ref 70–99)

## 2012-12-10 MED ORDER — CEFAZOLIN SODIUM 1-5 GM-% IV SOLN
1.0000 g | Freq: Once | INTRAVENOUS | Status: AC
  Administered 2012-12-10: 1 g via INTRAVENOUS
  Filled 2012-12-10: qty 50

## 2012-12-10 MED ORDER — GADOBENATE DIMEGLUMINE 529 MG/ML IV SOLN: 17.0000 mL | Freq: Once | INTRAVENOUS | Status: AC | PRN

## 2012-12-10 MED ORDER — VANCOMYCIN HCL IN DEXTROSE 1-5 GM/200ML-% IV SOLN
1000.0000 mg | Freq: Once | INTRAVENOUS | Status: AC
  Administered 2012-12-10: 1000 mg via INTRAVENOUS
  Filled 2012-12-10: qty 200

## 2012-12-10 NOTE — ED Provider Notes (Signed)
History    This chart was scribed for Delora Fuel, MD, MD by Rhae Lerner, ED Scribe. The patient was seen in room APA05 and the patient's care was started at 6:44PM.   CSN: [id number]  Arrival date & time 12/10/12  1554      Chief Complaint  Patient presents with  . Wound Check    (Consider location/radiation/quality/duration/timing/severity/associated sxs/prior treatment) The history is provided by the patient. No language interpreter was used.   TAMIR ROMAINE is a 61 y.o. male with hx of DM, neuropathy and CAD who presents to the Emergency Department complaining of wound on second toe of right foot onset 1 day ago. Pt reports that he dropped an object on his right foot causing the wound. He states that he had drainage 1 day ago. He rates the pain at 5/10. He denies fever, chills, nausea, vomiting,  numbness, weakness and any other pain. He reports using silver on wound.   Past Medical History  Diagnosis Date  . Arthritis   . Coronary artery disease   . Diabetes mellitus   . Hypertension     Past Surgical History  Procedure Date  . Joint replacement   . Fracture surgery   . Cardiac surgery     History reviewed. No pertinent family history.  History  Substance Use Topics  . Smoking status: Never Smoker   . Smokeless tobacco: Not on file  . Alcohol Use: No      Review of Systems  All other systems reviewed and are negative.    Allergies  Review of patient's allergies indicates no known allergies.  Home Medications   Current Outpatient Rx  Name  Route  Sig  Dispense  Refill  . AMITRIPTYLINE HCL 50 MG PO TABS   Oral   Take 150 mg by mouth at bedtime.         . ASPIRIN 81 MG PO CHEW   Oral   Chew 81 mg by mouth every morning.          . ATENOLOL 25 MG PO TABS   Oral   Take 25 mg by mouth 2 (two) times daily.         . ATORVASTATIN CALCIUM 80 MG PO TABS   Oral   Take 80 mg by mouth at bedtime.          . FUROSEMIDE 20 MG PO TABS   Oral    Take 20 mg by mouth 2 (two) times daily.         [name] GABAPENTIN 300 MG PO CAPS   Oral   Take 300 mg by mouth 3 (three) times daily.         [name] HYDROXYCHLOROQUINE SULFATE 200 MG PO TABS   Oral   Take 200 mg by mouth 2 (two) times daily.         [name] HYPROMELLOSE 2.5 % OP SOLN   Both Eyes   Place 1 drop into both eyes daily as needed. For dry eye relief         . INSULIN LISPRO PROT & LISPRO (75-25) 100 UNIT/ML [place] SUSP   Subcutaneous   Inject 12-20 Units into the skin 2 (two) times daily with a meal. Take 20 units in the morning and 12 units each night         . METHADONE HCL 10 MG PO TABS   Oral   Take 10 mg by mouth 4 (four) times daily.         [name]  METHADONE HCL 5 MG PO TABS   Oral   Take 5 mg by mouth as needed. For breakthrough pain         . PREDNISONE 5 MG PO TABS   Oral   Take 5 mg by mouth every morning.          . SULFASALAZINE 500 MG PO TABS   Oral   Take 500 mg by mouth 2 (two) times daily.         [name] TAMSULOSIN HCL 0.4 MG PO CAPS   Oral   Take 0.4 mg by mouth at bedtime.            BP 131/68  Pulse 62  Temp 98.9 F (37.2 C) (Oral)  Resp 18  Ht 5' 11.5" (1.816 m)  Wt 180 lb (81.647 kg)  BMI 24.75 kg/m2  SpO2 100%  Physical Exam  Nursing note and vitals reviewed. Constitutional: He is oriented to person, place, and time. He appears well-developed and well-nourished. No distress.  HENT:  Head: Normocephalic and atraumatic.  Cardiovascular: Normal rate, regular rhythm and normal heart sounds.   Pulmonary/Chest: Effort normal and breath sounds normal. No respiratory distress.  Abdominal: Soft. He exhibits no distension. There is no tenderness.  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  Skin: Skin is warm and dry.   Psychiatric: He has a normal mood and affect. His behavior is normal.    ED Course  Procedures (including critical care time) DIAGNOSTIC STUDIES: Oxygen Saturation is 100% on room air, normal by my interpretation.    COORDINATION OF CARE: 6:51 PM Discussed ED treatment with pt  6:51 PM Ordered:   Medications  hydroxypropyl methylcellulose (ISOPTO TEARS) 2.5 % ophthalmic solution (not administered)  gadobenate dimeglumine (MULTIHANCE) injection 17 mL (not administered)  ceFAZolin (ANCEF) IVPB 1 g/50 mL premix (0 g Intravenous Stopped 12/10/12 2031)       Results for orders placed during the hospital encounter of 12/10/12  GLUCOSE, CAPILLARY      Component Value Range   Glucose-Capillary 207 (*) 70 - 99 mg/dL  CBC WITH DIFFERENTIAL      Component Value Range   WBC 6.9  4.0 - 10.5 K/uL   RBC 4.13 (*) 4.22 - 5.81 MIL/uL   Hemoglobin 13.0  13.0 - 17.0 g/dL   HCT 38.3 (*) 39.0 - 52.0 %   MCV 92.7  78.0 - 100.0 fL   MCH 31.5  26.0 - 34.0 pg   MCHC 33.9  30.0 - 36.0 g/dL   RDW 13.1  11.5 - 15.5 %   Platelets 197  150 - 400 K/uL   Neutrophils Relative 62  43 - 77 %   Neutro Abs 4.3  1.7 - 7.7 K/uL   Lymphocytes Relative 27  12 - 46 %   Lymphs Abs 1.9  0.7 - 4.0 K/uL   Monocytes Relative 7  3 - 12 %   Monocytes Absolute 0.5  0.1 - 1.0 K/uL   Eosinophils Relative 3  0 - 5 %   Eosinophils Absolute 0.2  0.0 - 0.7 K/uL   Basophils Relative 1  0 - 1 %   Basophils Absolute 0.0  0.0 - 0.1 K/uL  SEDIMENTATION RATE      Component  Value Range   Sed Rate 37 (*) 0 - 16 mm/hr  BASIC METABOLIC PANEL      Component Value Range   Sodium 135  135 - 145 mEq/L   Potassium 4.5  3.5 - 5.1 mEq/L   Chloride 98  96 - 112 mEq/L   CO2 31  19 - 32 mEq/L   Glucose, Bld 170 (*) 70 - 99 mg/dL   BUN 15  6 - 23 mg/dL   Creatinine, Ser 0.87  0.50 - 1.35 mg/dL   Calcium 9.4  8.4 - 10.5 mg/dL   GFR calc non Af Amer 90 (*) >90 mL/min   GFR calc Af Amer >90  >90 mL/min   Mr Toes Right Wo/w  Cm  12/10/2012  *RADIOLOGY REPORT*  Clinical Data: Right second toe redness, swelling and bruising. Clinical concern for osteomyelitis.  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  MRI OF THE RIGHT TOES WITHOUT AND WITH CONTRAST  Technique:  Multiplanar, multisequence MR imaging was performed both before and after administration of intravenous contrast.  Contrast:  17 ml MultiHance  Comparison: Toe radiographs obtained earlier today.  Findings: Again demonstrated are second and third hammertoe deformities with proximal dislocations of the proximal phalanges relative to the metatarsals and flexion deformities of the toes.  There is also marrow edema in the distal aspect of the second metatarsal head.  There are also two erosions in the distal aspect of the second metatarsal head.  The larger of these measures 6 mm in maximum diameter with rim enhancement within the peripheral portions of this area and a small central area of non enhancement. There is enhancement of the overlying soft tissues and edema of the overlying soft tissues.  There is also focal subcortical enhancement in the distal aspect of the third metatarsal head, ventrally, with overlying soft tissue enhancement.  No soft tissue fluid collections are seen.  IMPRESSION: Osteomyelitis involving the distal aspects of the second and third metatarsal heads, as described above.   Original Report Authenticated By: Claudie Revering, M.D.    Dg Toe 2nd Right  12/10/2012  *RADIOLOGY REPORT*  Clinical Data: Contusion, redness, swelling  RIGTH SECOND TOE  Comparison: None.  Findings: Hammertoe deformities involving the second and third digits.  No fracture or dislocation is seen.  No radiographic findings to suggest osteomyelitis.  Vascular calcifications.  IMPRESSION: No radiographic findings to suggest osteomyelitis.   Original Report Authenticated By: Julian Hy, M.D.       1. Osteomyelitis of metatarsal       MDM   Right first toe appears infected. The question is whether this is cellulitis or osteomyelitis. Laboratory workup has been initiated and you'll empirically be started on Ancef.  Sedimentation rate is come back moderately elevated. MRI scan will be obtained.  MRI shows evidence of ostium of light as of the second and third metatarsal heads. He is given a dose of vancomycin and case is discussed with Dr. Megan Salon of [facility] who agrees to admit the patient.  I personally performed the services described in this documentation, which was scribed in my presence. The recorded information has been reviewed and is accurate.           Delora Fuel, MD [phone] [id number]

## 2012-12-10 NOTE — ED Notes (Signed)
Awaiting bed assignment.

## 2012-12-10 NOTE — ED Notes (Signed)
Returned from MR of toes.

## 2012-12-10 NOTE — ED Notes (Signed)
Pt with wound on right foot on second toe, states scabbed area x 1 month but last night with drainage

## 2012-12-11 ENCOUNTER — Observation Stay (HOSPITAL_COMMUNITY): Payer: Medicare Other

## 2012-12-11 ENCOUNTER — Encounter (HOSPITAL_COMMUNITY): Payer: Medicare Other

## 2012-12-11 ENCOUNTER — Encounter (HOSPITAL_COMMUNITY): Payer: Self-pay | Admitting: Cardiology

## 2012-12-11 DIAGNOSIS — I2510 Atherosclerotic heart disease of native coronary artery without angina pectoris: Secondary | ICD-10-CM | POA: Diagnosis present

## 2012-12-11 DIAGNOSIS — M869 Osteomyelitis, unspecified: Secondary | ICD-10-CM

## 2012-12-11 DIAGNOSIS — E785 Hyperlipidemia, unspecified: Secondary | ICD-10-CM | POA: Diagnosis present

## 2012-12-11 DIAGNOSIS — E1142 Type 2 diabetes mellitus with diabetic polyneuropathy: Secondary | ICD-10-CM

## 2012-12-11 DIAGNOSIS — E1129 Type 2 diabetes mellitus with other diabetic kidney complication: Secondary | ICD-10-CM | POA: Diagnosis present

## 2012-12-11 DIAGNOSIS — E1149 Type 2 diabetes mellitus with other diabetic neurological complication: Secondary | ICD-10-CM

## 2012-12-11 DIAGNOSIS — M069 Rheumatoid arthritis, unspecified: Secondary | ICD-10-CM | POA: Diagnosis present

## 2012-12-11 DIAGNOSIS — I10 Essential (primary) hypertension: Secondary | ICD-10-CM | POA: Diagnosis present

## 2012-12-11 LAB — COMPREHENSIVE METABOLIC PANEL
AST: 47 U/L — ABNORMAL HIGH (ref 0–37)
Albumin: 2.9 g/dL — ABNORMAL LOW (ref 3.5–5.2)
Calcium: 8.9 mg/dL (ref 8.4–10.5)
Creatinine, Ser: 0.78 mg/dL (ref 0.50–1.35)
GFR calc non Af Amer: >90 mL/min (ref >90)
Total Protein: 6.8 g/dL (ref 6.0–8.3)

## 2012-12-11 LAB — GLUCOSE, CAPILLARY
Glucose-Capillary: 122 mg/dL — ABNORMAL HIGH (ref 70–99)
Glucose-Capillary: 164 mg/dL — ABNORMAL HIGH (ref 70–99)
Glucose-Capillary: 210 mg/dL — ABNORMAL HIGH (ref 70–99)

## 2012-12-11 LAB — URINALYSIS, ROUTINE W REFLEX MICROSCOPIC
Bilirubin Urine: NEGATIVE
Protein, ur: NEGATIVE mg/dL
Urobilinogen, UA: 0.2 mg/dL (ref 0.0–1.0)

## 2012-12-11 LAB — CBC
MCH: 31.4 pg (ref 26.0–34.0)
MCHC: 34.2 g/dL (ref 30.0–36.0)
MCV: 92 fL (ref 78.0–100.0)
Platelets: 171 10*3/uL (ref 150–400)
RDW: 13.2 % (ref 11.5–15.5)

## 2012-12-11 LAB — URINE MICROSCOPIC-ADD ON

## 2012-12-11 LAB — HEMOGLOBIN A1C: Mean Plasma Glucose: 151 mg/dL — ABNORMAL HIGH (ref <117)

## 2012-12-11 MED ORDER — PIPERACILLIN-TAZOBACTAM 3.375 G IVPB: 3.3750 g | Freq: Three times a day (TID) | INTRAVENOUS | Status: DC

## 2012-12-11 MED ORDER — ENOXAPARIN SODIUM 40 MG/0.4ML ~~LOC~~ SOLN
40.0000 mg | SUBCUTANEOUS | Status: DC
  Administered 2012-12-11: 40 mg via SUBCUTANEOUS
  Filled 2012-12-11: qty 0.4

## 2012-12-11 MED ORDER — SODIUM CHLORIDE 0.9 % IJ SOLN: 10.0000 mL | Freq: Two times a day (BID) | INTRAMUSCULAR | Status: DC

## 2012-12-11 MED ORDER — TRAZODONE HCL 50 MG PO TABS: 50.0000 mg | ORAL_TABLET | Freq: Every evening | ORAL | Status: DC | PRN

## 2012-12-11 MED ORDER — ONDANSETRON HCL 4 MG/2ML IJ SOLN: 4.0000 mg | INTRAMUSCULAR | Status: DC | PRN

## 2012-12-11 MED ORDER — AMITRIPTYLINE HCL 25 MG PO TABS: 150.0000 mg | ORAL_TABLET | Freq: Every day | ORAL | Status: DC

## 2012-12-11 MED ORDER — ACETAMINOPHEN 325 MG PO TABS: 650.0000 mg | ORAL_TABLET | ORAL | Status: DC | PRN

## 2012-12-11 MED ORDER — ASPIRIN 81 MG PO CHEW
81.0000 mg | CHEWABLE_TABLET | Freq: Every morning | ORAL | Status: DC
  Administered 2012-12-11: 81 mg via ORAL
  Filled 2012-12-11: qty 1

## 2012-12-11 MED ORDER — INSULIN DETEMIR 100 UNIT/ML ~~LOC~~ SOLN
8.0000 [IU] | Freq: Two times a day (BID) | SUBCUTANEOUS | Status: DC
  Administered 2012-12-11: 8 [IU] via SUBCUTANEOUS
  Filled 2012-12-11: qty 10

## 2012-12-11 MED ORDER — SULFASALAZINE 500 MG PO TABS
500.0000 mg | ORAL_TABLET | Freq: Two times a day (BID) | ORAL | Status: DC
  Administered 2012-12-11: 500 mg via ORAL
  Filled 2012-12-11 (×4): qty 1

## 2012-12-11 MED ORDER — FLEET ENEMA 7-19 GM/118ML RE ENEM: 1.0000 | ENEMA | Freq: Once | RECTAL | Status: DC | PRN

## 2012-12-11 MED ORDER — POLYVINYL ALCOHOL 1.4 % OP SOLN: 1.0000 [drp] | Freq: Every day | OPHTHALMIC | Status: DC | PRN

## 2012-12-11 MED ORDER — PIPERACILLIN-TAZOBACTAM 3.375 G IVPB
3.3750 g | Freq: Three times a day (TID) | INTRAVENOUS | Status: DC
  Administered 2012-12-11: 3.375 g via INTRAVENOUS
  Filled 2012-12-11 (×4): qty 50

## 2012-12-11 MED ORDER — HYPROMELLOSE (GONIOSCOPIC) 2.5 % OP SOLN
1.0000 [drp] | Freq: Every day | OPHTHALMIC | Status: DC | PRN
  Filled 2012-12-11: qty 15

## 2012-12-11 MED ORDER — INSULIN ASPART 100 UNIT/ML ~~LOC~~ SOLN: 0.0000 [IU] | Freq: Every day | SUBCUTANEOUS | Status: DC

## 2012-12-11 MED ORDER — VANCOMYCIN HCL 10 G IV SOLR
1250.0000 mg | Freq: Two times a day (BID) | INTRAVENOUS | Status: DC
  Administered 2012-12-11: 1250 mg via INTRAVENOUS
  Filled 2012-12-11 (×3): qty 1250

## 2012-12-11 MED ORDER — POLYETHYLENE GLYCOL 3350 17 G PO PACK: 17.0000 g | PACK | Freq: Every day | ORAL | Status: DC | PRN

## 2012-12-11 MED ORDER — TAMSULOSIN HCL 0.4 MG PO CAPS: 0.4000 mg | ORAL_CAPSULE | Freq: Every day | ORAL | Status: DC

## 2012-12-11 MED ORDER — PIPERACILLIN-TAZOBACTAM 3.375 G IVPB
INTRAVENOUS | Status: AC
  Filled 2012-12-11: qty 50

## 2012-12-11 MED ORDER — ATENOLOL 25 MG PO TABS
25.0000 mg | ORAL_TABLET | Freq: Two times a day (BID) | ORAL | Status: DC
Start: 2012-12-11 — End: 2012-12-11
  Administered 2012-12-11: 25 mg via ORAL
  Filled 2012-12-11: qty 1

## 2012-12-11 MED ORDER — METHADONE HCL 10 MG PO TABS
10.0000 mg | ORAL_TABLET | Freq: Four times a day (QID) | ORAL | Status: DC
  Administered 2012-12-11 (×2): 10 mg via ORAL
  Filled 2012-12-11 (×2): qty 1

## 2012-12-11 MED ORDER — SODIUM CHLORIDE 0.9 % IV SOLN
Freq: Once | INTRAVENOUS | Status: AC
  Administered 2012-12-11: 20 mL/h via INTRAVENOUS

## 2012-12-11 MED ORDER — VANCOMYCIN HCL 10 G IV SOLR: 1250.0000 mg | Freq: Two times a day (BID) | INTRAVENOUS | Status: DC

## 2012-12-11 MED ORDER — SODIUM CHLORIDE 0.9 % IJ SOLN: 10.0000 mL | INTRAMUSCULAR | Status: DC | PRN

## 2012-12-11 MED ORDER — GABAPENTIN 300 MG PO CAPS
300.0000 mg | ORAL_CAPSULE | Freq: Three times a day (TID) | ORAL | Status: DC
  Administered 2012-12-11 (×2): 300 mg via ORAL
  Filled 2012-12-11 (×2): qty 1

## 2012-12-11 MED ORDER — PREDNISONE 10 MG PO TABS
5.0000 mg | ORAL_TABLET | Freq: Every morning | ORAL | Status: DC
  Administered 2012-12-11: 5 mg via ORAL
  Filled 2012-12-11: qty 1

## 2012-12-11 MED ORDER — SORBITOL 70 % SOLN
30.0000 mL | Freq: Every day | Status: DC | PRN
  Filled 2012-12-11: qty 30

## 2012-12-11 MED ORDER — INSULIN ASPART 100 UNIT/ML ~~LOC~~ SOLN
0.0000 [IU] | Freq: Three times a day (TID) | SUBCUTANEOUS | Status: DC
  Administered 2012-12-11: 2 [IU] via SUBCUTANEOUS
  Administered 2012-12-11: 1 [IU] via SUBCUTANEOUS

## 2012-12-11 MED ORDER — INFLUENZA VIRUS VACC SPLIT PF IM SUSP
0.5000 mL | INTRAMUSCULAR | Status: DC
  Filled 2012-12-11: qty 0.5

## 2012-12-11 MED ORDER — ATORVASTATIN CALCIUM 40 MG PO TABS: 80.0000 mg | ORAL_TABLET | Freq: Every day | ORAL | Status: DC

## 2012-12-11 MED ORDER — PIPERACILLIN-TAZOBACTAM 3.375 G IVPB
3.3750 g | Freq: Once | INTRAVENOUS | Status: AC
  Administered 2012-12-11: 3.375 g via INTRAVENOUS
  Filled 2012-12-11: qty 50

## 2012-12-11 MED ORDER — FUROSEMIDE 20 MG PO TABS
20.0000 mg | ORAL_TABLET | Freq: Two times a day (BID) | ORAL | Status: DC
  Administered 2012-12-11: 20 mg via ORAL
  Filled 2012-12-11: qty 1

## 2012-12-11 MED ORDER — HYDROXYCHLOROQUINE SULFATE 200 MG PO TABS
200.0000 mg | ORAL_TABLET | Freq: Two times a day (BID) | ORAL | Status: DC
  Administered 2012-12-11: 200 mg via ORAL
  Filled 2012-12-11 (×4): qty 1

## 2012-12-11 MED ORDER — METHADONE HCL 10 MG PO TABS
5.0000 mg | ORAL_TABLET | ORAL | Status: DC | PRN
  Administered 2012-12-11: 5 mg via ORAL
  Filled 2012-12-11: qty 1

## 2012-12-11 NOTE — Consult Note (Signed)
WOC consult requested for dressing changes, however I spoke with nursing and bedside nurse reports no open wound and no drainage at this time.  Due to suspected osteomyelitis, this is considered outside the scope of practice of the [facility] nurse.  Does not appear to need topical wound care either.  Orthopedics have also consulted and pt is to follow up with is foot doctor at [facility] on Friday.   No wound care orders written at this time. Re consult if needed, will not follow at this time. Thanks  Josuel Koeppen [place], [place] [phone])

## 2012-12-11 NOTE — Progress Notes (Signed)
UR Chart Review Completed  

## 2012-12-11 NOTE — Progress Notes (Signed)
Patient discharged home with instructions given on medications,and follow up visits.[facility] to follow up with patient.[facility] notified this afternoon of patient being discharged.No c/o pain or discomfort noted Accompanied by staff to an awaiting vehicle.

## 2012-12-11 NOTE — Care Management Note (Signed)
    Page 1 of 1   12/12/2012     9:04:54 AM   CARE MANAGEMENT NOTE 12/12/2012  Patient:  [name], [name]   Account Number:  [id number]  Date Initiated:  12/11/2012  Documentation initiated by:  Claretha Cooper  Subjective/Objective Assessment:   Pt admitted with osteomylitis in his toe. PTA, scheduled to see physician in [facility] regarding treatment. Pt and wife have previously worked with [facility] for IV AB at home. Wife is very comfortable with this and they agree going home with...     Action/Plan:   AB IV is best. Getting PICC line.   Anticipated DC Date:  12/11/2012   Anticipated DC Plan:  [place]  CM consult      Choice offered to / List presented to:          [facility] arranged  HH-1 RN      [place].   Status of service:  Completed, signed off Medicare Important Message given?   (If response is "NO", the following Medicare IM given date fields will be blank) Date Medicare IM given:   Date Additional Medicare IM given:    Discharge Disposition:  [place]  Per UR Regulation:    If discussed at Long Length of Stay Meetings, dates discussed:    Comments:  12/11/12  Claretha Cooper RN BSN CM

## 2012-12-11 NOTE — Progress Notes (Addendum)
ANTIBIOTIC CONSULT NOTE - INITIAL  Pharmacy Consult for Zosyn and Vancomycin Indication: osteomyelitis  No Known Allergies  Patient Measurements: Height: 5' 11.5" (181.6 cm) Weight: 171 lb 15.3 oz (78 kg) IBW/kg (Calculated) : 76.45   Vital Signs: Temp: 97.9 F (36.6 C) (12/17 0515) Temp src: Oral (12/17 0515) BP: 91/52 mmHg (12/17 0525) Pulse Rate: 58  (12/17 0525) Intake/Output from previous day: 12/16 0701 - 12/17 0700 In: 50 [IV Piggyback:50] Out: -  Intake/Output from this shift:    Labs:  [facility] 12/11/12 0436 12/10/12 1856  WBC 5.7 6.9  HGB 12.2* 13.0  PLT 171 197  LABCREA -- --  CREATININE 0.78 0.87   Estimated Creatinine Clearance: 102.3 ml/min (by C-G formula based on Cr of 0.78). No results found for this basename: VANCOTROUGH:2,VANCOPEAK:2,VANCORANDOM:2,GENTTROUGH:2,GENTPEAK:2,GENTRANDOM:2,TOBRATROUGH:2,TOBRAPEAK:2,TOBRARND:2,AMIKACINPEAK:2,AMIKACINTROU:2,AMIKACIN:2, in the last 72 hours   Microbiology: No results found for this or any previous visit (from the past 720 hour(s)).  Medical History: Past Medical History  Diagnosis Date  . Rheumatoid arthritis   . Coronary artery disease 1993    angioplasty after AMI  . Diabetes mellitus   . Hypertension   . Diabetic neuropathy     Medications:  Scheduled:    . [COMPLETED] sodium chloride   Intravenous Once  . amitriptyline  150 mg Oral QHS  . aspirin  81 mg Oral q morning - 10a  . atenolol  25 mg Oral BID  . atorvastatin  80 mg Oral QHS  . [COMPLETED] ceFAZolin  1 g Intravenous Once  . enoxaparin (LOVENOX) injection  40 mg Subcutaneous Q24H  . furosemide  20 mg Oral BID  . gabapentin  300 mg Oral TID  . hydroxychloroquine  200 mg Oral BID  . influenza  inactive virus vaccine  0.5 mL Intramuscular Tomorrow-1000  . insulin aspart  0-5 Units Subcutaneous QHS  . insulin aspart  0-9 Units Subcutaneous TID WC  . insulin detemir  8 Units Subcutaneous BID  . methadone  10 mg Oral QID  .  piperacillin-tazobactam (ZOSYN)  IV  3.375 g Intravenous Once  . piperacillin-tazobactam (ZOSYN)  IV  3.375 g Intravenous Q8H  . predniSONE  5 mg Oral q morning - 10a  . sulfaSALAzine  500 mg Oral BID  . Tamsulosin HCl  0.4 mg Oral QHS  . [COMPLETED] vancomycin  1,000 mg Intravenous Once   Assessment: 61yo male with good renal fxn.  Estimated Creatinine Clearance: 102.3 ml/min (by C-G formula based on Cr of 0.78).   Has h/o toe amputation associated with osteomyelitis.  Admitted for suspected osteomyelitis of right middle toe.  Goal of Therapy:  Eradicate infection. Trough level 15-20 for Vancomycin  Plan: Zosyn 3.375gm IV q8hrs (4hr infusion) Vancomycin 1250mg  iv q12hrs Check trough at steady state. Monitor labs, renal fxn, and cultures per protocol. Duration of therapy per MD  Hart Robinsons A 12/11/2012,7:41 AM

## 2012-12-11 NOTE — Discharge Summary (Signed)
Physician Discharge Summary  [name] [id number] DOB: Mar 19, 1951 DOA: 12/10/2012    Admit date: 12/10/2012 Discharge date: 12/11/2012  Time spent: Less than 30 minutes  Recommendations for Outpatient Follow-up:  1. Advance home care to monitor prolonged course of intravenous antibiotics and PICC line care.   Discharge Diagnoses:  1. Osteomyelitis involving the right second and third toes. 2. Type 2 diabetes mellitus. 3. Rheumatoid arthritis on immunosuppressive treatment. 4. Hypertension. 5. Coronary artery disease, stable. 6. Status post PICC line insertion for prolonged course of intravenous antibiotics.  Discharge Condition: Stable.  Diet recommendation: Carbohydrate modified diet.  Filed Weights   12/10/12 1610 12/11/12 0050 12/11/12 0525  Weight: 81.647 kg (180 lb) 76.7 kg (169 lb 1.5 oz) 78 kg (171 lb 15.3 oz)    History of present illness:  This very pleasant 61 year old man was admitted last night with symptoms of redness and inflammation of the right third toe for the last 3 days. 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  He has noted a scab on top of the right middle toe for about 2 weeks now, not sure how it got there but then, after something fell on the toe 3 days ago, s the toes become progressively more red and today it looked markedly more red and swollen and painful and he came to our emergency room for evaluation. He did call his orthopedic surgeon but could not get an appointment for Thursday or Friday. He did notice a discharge from the toe yesterday  He has had no fevers or chills;   Rheumatoid arthritis causes severe deformity of the hands and hammertoe, and affects in particular his shoulders, hands and knees and is status post hip replacement.  He is on a multiple immunomodulating is for his rheumatoid arthritis  He is receiving injections in his right eye for diabetic eye problems  Hospital Course:  Patient was admitted and started on intravenous antibiotics. The patient and his wife have already been in contact with the surgeon at [facility] and have an appointment with the surgeon this coming Friday. Therefore, after he was seen by orthopedics here, Dr. Luna Glasgow, who recommended continued care with [facility], a PICC line has been inserted and intravenous vancomycin and Zosyn have been started. He is remained completely stable and is not toxic or septic. He is medically stable for discharge.  Procedures:  PICC line insertion.   Consultations:  Orthopedics, Dr. Luna Glasgow.  Discharge Exam: Filed Vitals:   12/11/12 0515 12/11/12 0520 12/11/12 0525 12/11/12 1337  BP: 125/72 102/63 91/52 137/80  Pulse: 67 60 58 66  Temp: 97.9 F (36.6 C)   98 F (36.7 C)  TempSrc: Oral     Resp: 16   18  Height:      Weight:   78 kg (171 lb 15.3 oz)   SpO2: 100%   96%    General: He looks systemically well. Is not toxic or septic. Cardiovascular: Heart sounds are present without murmurs. Respiratory: Lung fields are clear. He is alert and orientated.  Discharge Instructions  Discharge Orders    Future Orders Please Complete By Expires  Diet - low sodium heart healthy      Increase activity slowly          Medication List     As of 12/11/2012  3:00 PM    TAKE these medications         amitriptyline 50 MG tablet   Commonly known as: ELAVIL   Take 150 mg by mouth at bedtime.      aspirin 81 MG chewable tablet   Chew 81 mg by mouth every morning.      atenolol 25 MG tablet   Commonly known as: TENORMIN   Take 25 mg by mouth 2 (two) times daily.       furosemide 20 MG tablet   Commonly known as: LASIX   Take 20 mg by mouth 2 (two) times daily.      gabapentin 300 MG capsule   Commonly known as: NEURONTIN   Take 300 mg by mouth 3 (three) times daily.      hydroxychloroquine 200 MG tablet   Commonly known as: PLAQUENIL   Take 200 mg by mouth 2 (two) times daily.      hydroxypropyl methylcellulose 2.5 % ophthalmic solution   Commonly known as: ISOPTO TEARS   Place 1 drop into both eyes daily as needed. For dry eye relief      insulin lispro protamine-insulin lispro (75-25) 100 UNIT/ML Susp   Commonly known as: HUMALOG 75/25   Inject 12-20 Units into the skin 2 (two) times daily with a meal. Take 20 units in the morning and 12 units each night      LIPITOR 80 MG tablet   Generic drug: atorvastatin   Take 80 mg by mouth at bedtime.      methadone 5 MG tablet   Commonly known as: DOLOPHINE   Take 5 mg by mouth as needed. For breakthrough pain      methadone 10 MG tablet   Commonly known as: DOLOPHINE   Take 10 mg by mouth 4 (four) times daily.      piperacillin-tazobactam 3.375 GM/50ML IVPB   Commonly known as: ZOSYN   Inject 50 mLs (3.375 g total) into the vein every 8 (eight) hours.      predniSONE 5 MG tablet   Commonly known as: DELTASONE   Take 5 mg by mouth every morning.      sodium chloride 0.9 % SOLN 250 mL with vancomycin 10 G SOLR 1,250 mg   Inject 1,250 mg into the vein every 12 (twelve) hours.      sulfaSALAzine 500 MG tablet   Commonly known as: AZULFIDINE   Take 500 mg by mouth 2 (two) times daily.      Tamsulosin HCl 0.4 MG Caps   Commonly known as: FLOMAX   Take 0.4 mg by mouth at bedtime.          The results of significant diagnostics from this hospitalization (including imaging, microbiology, ancillary and laboratory) are listed below for reference.    Significant Diagnostic Studies: Mr Toes Right Wo/w Cm  12/10/2012  *RADIOLOGY REPORT*  Clinical Data: Right second toe redness, swelling  and bruising. Clinical concern for osteomyelitis.  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  MRI OF THE RIGHT TOES WITHOUT AND WITH CONTRAST  Technique:  Multiplanar, multisequence MR imaging was performed both before and after administration of intravenous contrast.  Contrast:  17 ml MultiHance  Comparison: Toe radiographs obtained earlier today.  Findings: Again demonstrated  are second and third hammertoe deformities with proximal dislocations of the proximal phalanges relative to the metatarsals and flexion deformities of the toes.  There is also marrow edema in the distal aspect of the second metatarsal head.  There are also two erosions in the distal aspect of the second metatarsal head.  The larger of these measures 6 mm in maximum diameter with rim enhancement within the peripheral portions of this area and a small central area of non enhancement. There is enhancement of the overlying soft tissues and edema of the overlying soft tissues.  There is also focal subcortical enhancement in the distal aspect of the third metatarsal head, ventrally, with overlying soft tissue enhancement.  No soft tissue fluid collections are seen.  IMPRESSION: Osteomyelitis involving the distal aspects of the second and third metatarsal heads, as described above.   Original Report Authenticated By: Claudie Revering, M.D.    Dg Chest Port 1 View  12/11/2012  *RADIOLOGY REPORT*  Clinical Data: PICC placement.  PORTABLE CHEST - 1 VIEW  Comparison: PA and lateral chest 04/14/2012.  Findings: Right PICC is in place with the tip projecting over the lower superior vena cava.  Heart size is normal.  No pneumothorax or pleural fluid.  Severe degenerative disease about the shoulders noted.  IMPRESSION: Tip of right PICC projects over the lower superior vena cava.  No acute disease.   Original Report Authenticated By: Orlean Patten, M.D.    Dg Toe 2nd Right  12/10/2012  *RADIOLOGY REPORT*  Clinical  Data: Contusion, redness, swelling  RIGTH SECOND TOE  Comparison: None.  Findings: Hammertoe deformities involving the second and third digits.  No fracture or dislocation is seen.  No radiographic findings to suggest osteomyelitis.  Vascular calcifications.  IMPRESSION: No radiographic findings to suggest osteomyelitis.   Original Report Authenticated By: Julian Hy, M.D.        Labs: Basic Metabolic Panel:  Lab [id number] 0436 12/10/12 1856  NA 136 135  K 3.8 4.5  CL 100 98  CO2 29 31  GLUCOSE 153* 170*  BUN 13 15  CREATININE 0.78 0.87  CALCIUM 8.9 9.4  MG -- --  PHOS -- --   Liver Function Tests:  Lab 12/11/12 0436  AST 47*  ALT 29  ALKPHOS 193*  BILITOT 0.6  PROT 6.8  ALBUMIN 2.9*     CBC:  Lab 12/11/12 0436 12/10/12 1856  WBC 5.7 6.9  NEUTROABS -- 4.3  HGB 12.2* 13.0  HCT 35.7* 38.3*  MCV 92.0 92.7  PLT 171 197     CBG:  Lab 12/11/12 1121 12/11/12 0742 12/10/12 1747  GLUCAP 164* 122* 207*       Signed:  Declo C  [facility] 12/11/2012, 3:00 PM

## 2012-12-11 NOTE — Consult Note (Signed)
ANTIBIOTIC CONSULT NOTE-Preliminary  Pharmacy Consult for Zosyn Indication: Osteomyelitis  No Known Allergies  Patient Measurements: Height: 5' 11.5" (181.6 cm) Weight: 169 lb 1.5 oz (76.7 kg) IBW/kg (Calculated) : 76.45    Vital Signs: Temp: 97.7 F (36.5 C) (12/17 0050) Temp src: Oral (12/17 0050) BP: 159/68 mmHg (12/17 0050) Pulse Rate: 65  (12/17 0050)  Labs:  [facility] 12/10/12 1856  WBC 6.9  HGB 13.0  PLT 197  LABCREA --  CREATININE 0.87    Estimated Creatinine Clearance: 94 ml/min (by C-G formula based on Cr of 0.87).  No results found for this basename: VANCOTROUGH:2,VANCOPEAK:2,VANCORANDOM:2,GENTTROUGH:2,GENTPEAK:2,GENTRANDOM:2,TOBRATROUGH:2,TOBRAPEAK:2,TOBRARND:2,AMIKACINPEAK:2,AMIKACINTROU:2,AMIKACIN:2, in the last 72 hours   Microbiology: No results found for this or any previous visit (from the past 720 hour(s)).  Medical History: Past Medical History  Diagnosis Date  . Rheumatoid arthritis   . Coronary artery disease 1993    angioplasty after AMI  . Diabetes mellitus   . Hypertension   . Diabetic neuropathy     Medications:  Cefazolin 1 Gm IV in the ED Vancomycin 1 GM IV in the ED       Assessment: This patient is a 61 yo male with medical Hx sig for diabetes; S/P amputations of the 4th and 5th right toe who has osteomyelitis of right 2nd toe.   Goal of Therapy: Eradication of infection    Plan:  Preliminary review of pertinent patient information completed.  Protocol will be initiated with a one-time dose(s) of Zosyn 3.375 GM IV.  Forestine Na clinical pharmacist will complete review during morning rounds to assess patient and finalize treatment regimen.  Norberto Sorenson, [facility] 12/11/2012,2:49 AM

## 2012-12-11 NOTE — H&P (Signed)
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  He has noted a scab on top of the right middle toe for about 2 weeks now, not sure how it got there but then, after something fell on the toe 3 days ago, s the toes become progressively more red and today it looked markedly more red and swollen and painful and he came to our emergency room for evaluation. He did call his orthopedic surgeon but could not get an appointment for Thursday or Friday. He did notice a discharge from the toe yesterday  He has had no fevers or chills;  Rheumatoid arthritis causes severe deformity of the hands and hammertoe, and affects in particular his shoulders, hands and knees and is status post hip replacement. He is on a multiple immunomodulating is for his rheumatoid arthritis He is receiving injections in his right eye for diabetic eye problems  Rewiew of Systems:  All systems negative except as marked bold or noted in the HPI;   Constitutional: malaise, fever and chills. ;  Eyes: eye pain, redness and discharge. ;  ENMT: ear pain, hoarseness, nasal congestion, sinus pressure and sore throat. ;  Cardiovascular: chest pain, palpitations, diaphoresis, dyspnea and peripheral edema. ;  Respiratory: cough, hemoptysis, wheezing and stridor. ;  Gastrointestinal: nausea, vomiting, diarrhea, constipation, abdominal pain, melena, blood in stool, hematemesis,  jaundice and rectal bleeding. unusual weight loss..  Genitourinary: frequency, dysuria, incontinence,flank pain and hematuria;  Musculoskeletal: back pain and neck pain. swelling and trauma.; pain shoulders knees, hands, feet,  Skin: . pruritus, rash, abrasions, bruising and skin lesion.; ulcerations  Neuro: headache, lightheadedness and neck stiffness. weakness, altered level of consciousness , altered mental status, extremity weakness, burning feet, involuntary movement, seizure and syncope.  Psych: anxiety, depression, insomnia, tearfulness, panic attacks, hallucinations, paranoia, suicidal or homicidal ideation     Past Medical History  Diagnosis Date  . Rheumatoid arthritis   . Coronary artery disease 1993    angioplasty after AMI  . Diabetes mellitus   . Hypertension   . Diabetic neuropathy     Past Surgical History  Procedure Date  . Joint replacement   . Fracture surgery   . Cardiac surgery     Medications:  HOME MEDS: Prior to Admission medications   Medication Sig Start Date End Date Taking? Authorizing Provider  amitriptyline (ELAVIL) 50 MG tablet Take 150 mg by mouth at bedtime.   Yes Historical Provider, MD  aspirin 81 MG chewable tablet Chew 81 mg by mouth every morning.    Yes Historical Provider, MD  atenolol (TENORMIN) 25 MG tablet Take 25 mg by mouth 2 (two) times daily.   Yes Historical Provider, MD  atorvastatin (LIPITOR) 80 MG tablet Take 80 mg by mouth at bedtime.    Yes Historical Provider, MD  furosemide (LASIX) 20 MG tablet Take 20 mg by mouth 2 (two) times daily.   Yes Historical  Provider, MD  gabapentin (NEURONTIN) 300 MG capsule Take 300 mg by mouth 3 (three) times daily.   Yes Historical Provider, MD  hydroxychloroquine (PLAQUENIL) 200 MG tablet Take 200 mg by mouth 2 (two) times daily.   Yes Historical Provider, MD  hydroxypropyl methylcellulose (ISOPTO TEARS) 2.5 % ophthalmic solution Place 1 drop into both eyes daily as needed. For dry eye relief    Yes Historical Provider, MD  insulin lispro protamine-insulin lispro (HUMALOG 75/25) (75-25) 100 UNIT/ML SUSP Inject 12-20 Units into the skin 2 (two) times daily with a meal. Take 20 units in the morning and 12 units each night   Yes Historical Provider, MD  methadone (DOLOPHINE) 10 MG tablet Take 10 mg by mouth 4 (four) times daily.   Yes Historical Provider, MD  methadone (DOLOPHINE) 5 MG tablet Take 5 mg by mouth as needed. For breakthrough pain   Yes Historical Provider, MD  predniSONE (DELTASONE) 5 MG tablet Take 5 mg by mouth every morning.    Yes Historical Provider, MD  sulfaSALAzine (AZULFIDINE) 500 MG tablet Take 500 mg by mouth 2 (two) times daily.   Yes Historical Provider, MD  Tamsulosin HCl (FLOMAX) 0.4 MG CAPS Take 0.4 mg by mouth at bedtime.    Yes Historical Provider, MD     Allergies:  No Known Allergies  Social History:   reports that he has never smoked. He does not have any smokeless tobacco history on file. He reports that he does not drink alcohol or use illicit drugs.  Family History: Family History  Problem Relation Age of Onset  . Diabetes Father   . Dementia Father   . Heart attack Brother     multiple brothers with MIs  . Hypertension Mother   . Stroke Mother      Physical Exam: Filed Vitals:   12/10/12 1610 12/10/12 1821 12/10/12 2228 12/11/12 0050  BP: 118/66 131/68 123/65 159/68  Pulse: 80 62 65 65  Temp: 98.9 F (37.2 C)  98.6 F (37 C) 97.7 F (36.5 C)  TempSrc: Oral  Oral Oral  Resp: 20 18 18 16   Height: 5' 11.5" (1.816 m)   5' 11.5" (1.816 m)  Weight: 81.647 kg (180 lb)   76.7 kg (169 lb 1.5 oz)  SpO2: 99% 100% 98% 100%   Blood pressure 159/68, pulse 65, temperature 97.7 F (36.5 C), temperature source Oral, resp. rate 16, height 5' 11.5" (1.816 m), weight 76.7 kg (169 lb 1.5 oz), SpO2 100.00%.  GEN:  Pleasant Caucasian gentleman lying in the bed in no acute distress; cooperative with exam PSYCH:  alert and oriented x4; does not  appear anxious or depressed; affect is appropriate. HEENT: Mucous membranes pink and anicteric; PERRLA; EOM intact; no cervical lymphadenopathy nor thyromegaly or carotid bruit; no JVD; Breasts:: Not examined CHEST WALL: No tenderness CHEST: Normal respiration, clear to auscultation bilaterally HEART: Regular rate and rhythm; no murmurs rubs or gallops BACK: No kyphosis or scoliosis; no CVA tenderness ABDOMEN: , soft non-tender; no masses, no organomegaly, normal abdominal bowel sounds; no pannus; no intertriginous candida. Rectal Exam: Not done EXTREMITIES: Extensive arthropathy of both hands with marked flexion contractures and ulnar deviation of the fingers; hammertoes; no edema; mildly swollen and red right middle toe, with small overlying ulcer at the first intertarsal joint; surgically absent fourth and fifth toes of the right foot with well-healed scar Genitalia: not examined PULSES: 2+ and symmetric SKIN: Normal hydration no rash or ulceration CNS: Cranial nerves 2-12 grossly intact no  focal lateralizing neurologic deficit   Labs on Admission:  Basic Metabolic Panel:  Lab [id number] 1856  NA 135  K 4.5  CL 98  CO2 31  GLUCOSE 170*  BUN 15  CREATININE 0.87  CALCIUM 9.4  MG --  PHOS --   Liver Function Tests: No results found for this basename: AST:5,ALT:5,ALKPHOS:5,BILITOT:5,PROT:5,ALBUMIN:5 in the last 168 hours No results found for this basename: LIPASE:5,AMYLASE:5 in the last 168 hours No results found for this basename: AMMONIA:5 in the last 168 hours CBC:  Lab 12/10/12 1856  WBC 6.9  NEUTROABS 4.3  HGB 13.0  HCT 38.3*  MCV 92.7  PLT 197   Cardiac Enzymes: No results found for this basename: CKTOTAL:5,CKMB:5,CKMBINDEX:5,TROPONINI:5 in the last 168 hours BNP: No components found with this basename: POCBNP:5 D-dimer: No components found with this basename: D-DIMER:5 CBG:  Lab 12/10/12 1747  GLUCAP 207*    Radiological Exams on Admission: Mr Toes Right  Wo/w Cm  12/10/2012  *RADIOLOGY REPORT*  Clinical Data: Right second toe redness, swelling and bruising. Clinical concern for osteomyelitis.  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  MRI OF THE RIGHT TOES WITHOUT AND WITH CONTRAST  Technique:  Multiplanar, multisequence MR imaging was performed both before and after administration of intravenous contrast.  Contrast:  17 ml MultiHance  Comparison: Toe radiographs obtained earlier today.  Findings: Again demonstrated are second and third hammertoe deformities with proximal dislocations of the proximal phalanges relative to the metatarsals and flexion deformities of the toes.  There is also marrow edema in the distal aspect of the second metatarsal head.  There are also two erosions in the distal aspect of the second metatarsal head.  The larger of these measures 6 mm in maximum diameter with rim enhancement within the peripheral portions of this area and a small central area of non enhancement. There is enhancement of the overlying soft tissues and edema of the overlying soft tissues.  There is also focal subcortical enhancement in the distal aspect of the third metatarsal head, ventrally, with overlying soft tissue enhancement.  No soft tissue fluid collections are seen.  IMPRESSION: Osteomyelitis involving the distal aspects of the second and third metatarsal heads, as described above.   Original Report Authenticated By: Claudie Revering, M.D.    Dg Toe 2nd Right  12/10/2012  *RADIOLOGY REPORT*  Clinical Data: Contusion, redness, swelling  RIGTH SECOND TOE  Comparison: None.  Findings: Hammertoe deformities involving the second and third digits.  No fracture or dislocation is seen.  No radiographic findings to suggest osteomyelitis.  Vascular calcifications.  IMPRESSION: No radiographic findings to suggest osteomyelitis.   Original Report Authenticated By: Julian Hy, M.D.      Assessment/Plan Present on Admission:  .  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 DM (diabetes mellitus), type 2 with neurological complications  . Coronary artery disease . Rheumatoid arthritis . Hypertension . Hyperlipidemia   PLAN: We'll admit this gentleman to initiate antibiotic therapy, will use Zosyn and vancomycin; 1 Will consult orthopedic surgeon for assistance with management. Have explained to this gentleman that his options include, a trial of long-term intravenous therapy at home with a PICC line, which can be arranged by [place] or his own orthopedic surgeon; referred back to his orthopedic surgeon for surgery if indicated; surgery at our hospital if indicated.  Continue management of chronic medical conditions  Other plans as per orders.  Code Status: FULL  CODE  Family Communication:  Wife [name] [phone], present at the interview and examination and discussion of plans Disposition Plan: Depending on evaluation and assessment by orthopedic surgeon    Ellora Varnum Nocturnist [facility] Pager [phone]   12/11/2012, 3:16 AM

## 2012-12-11 NOTE — Consult Note (Signed)
Reason for Consult:Osteomyelits of the right foot Referring Physician: Hospitalist  [name] is an 61 y.o. male.  HPI: He dropped an object on his right foot and second toe Saturday.  Today is Tuesday.  His second toe started turning red yesterday.  He has an orthopaedic surgeon at [facility] who has managed his severe rheumatoid arthritis over the years.  They called Dr. Buddy Duty at [facility] yesterday but his schedule was full.  They presented to the ER.  X-rays were taken of the right foot and then he had MRI of the distal foot which showed possible osteomyelitis of the second and third metatarsal heads.  He has been placed on antibiotics.  Previously when he had problems with the right fourth and fifth toes he had a PICC line placed and antibiotics for six weeks.  Post surgery he had problems with wound healing.  He and his wife would like to have antibiotics now, possible PICC line placement but they want to have further care at [facility] by Dr. Tammy Sours.  His wife says she will call [facility] later this morning.  They both understand the significance of the problem and that he could lose his toe.  Past Medical History  Diagnosis Date  . Rheumatoid arthritis   . Coronary artery disease 1993    angioplasty after AMI  . Diabetes mellitus   . Hypertension   . Diabetic neuropathy     Past Surgical History  Procedure Date  . Joint replacement   . Fracture surgery   . Cardiac surgery     Family History  Problem Relation Age of Onset  . Diabetes Father   . Dementia Father   . Heart attack Brother     multiple brothers with MIs  . Hypertension Mother   . Stroke Mother     Social History:  reports that he has never smoked. He does not have any smokeless tobacco history on file. He reports that he does not drink alcohol or use illicit drugs.  Allergies: No Known Allergies  Medications: I have reviewed the patient's current medications.  Results for orders  placed during the hospital encounter of 12/10/12 (from the past 48 hour(s))  GLUCOSE, CAPILLARY     Status: Abnormal   Collection Time   12/10/12  5:47 PM      Component Value Range Comment   Glucose-Capillary 207 (*) 70 - 99 mg/dL   CBC WITH DIFFERENTIAL     Status: Abnormal   Collection Time   12/10/12  6:56 PM      Component Value Range Comment   WBC 6.9  4.0 - 10.5 K/uL    RBC 4.13 (*) 4.22 - 5.81 MIL/uL    Hemoglobin 13.0  13.0 - 17.0 g/dL    HCT 38.3 (*) 39.0 - 52.0 %    MCV 92.7  78.0 - 100.0 fL    MCH 31.5  26.0 - 34.0 pg    MCHC 33.9  30.0 - 36.0 g/dL    RDW 13.1  11.5 - 15.5 %    Platelets 197  150 - 400 K/uL    Neutrophils Relative 62  43 - 77 %    Neutro Abs 4.3  1.7 - 7.7 K/uL    Lymphocytes Relative 27  12 - 46 %    Lymphs Abs 1.9  0.7 - 4.0 K/uL    Monocytes Relative 7  3 - 12 %    Monocytes Absolute 0.5  0.1 -  1.0 K/uL    Eosinophils Relative 3  0 - 5 %    Eosinophils Absolute 0.2  0.0 - 0.7 K/uL    Basophils Relative 1  0 - 1 %    Basophils Absolute 0.0  0.0 - 0.1 K/uL   SEDIMENTATION RATE     Status: Abnormal   Collection Time   12/10/12  6:56 PM      Component Value Range Comment   Sed Rate 37 (*) 0 - 16 mm/hr   BASIC METABOLIC PANEL     Status: Abnormal   Collection Time   12/10/12  6:56 PM      Component Value Range Comment   Sodium 135  135 - 145 mEq/L    Potassium 4.5  3.5 - 5.1 mEq/L    Chloride 98  96 - 112 mEq/L    CO2 31  19 - 32 mEq/L    Glucose, Bld 170 (*) 70 - 99 mg/dL    BUN 15  6 - 23 mg/dL    Creatinine, Ser 0.87  0.50 - 1.35 mg/dL    Calcium 9.4  8.4 - 10.5 mg/dL    GFR calc non Af Amer 90 (*) >90 mL/min    GFR calc Af Amer >90  >90 mL/min   CBC     Status: Abnormal   Collection Time   12/11/12  4:36 AM      Component Value Range Comment   WBC 5.7  4.0 - 10.5 K/uL    RBC 3.88 (*) 4.22 - 5.81 MIL/uL    Hemoglobin 12.2 (*) 13.0 - 17.0 g/dL    HCT 35.7 (*) 39.0 - 52.0 %    MCV 92.0  78.0 - 100.0 fL    MCH 31.4  26.0 - 34.0 pg      MCHC 34.2  30.0 - 36.0 g/dL    RDW 13.2  11.5 - 15.5 %    Platelets 171  150 - 400 K/uL   COMPREHENSIVE METABOLIC PANEL     Status: Abnormal   Collection Time   12/11/12  4:36 AM      Component Value Range Comment   Sodium 136  135 - 145 mEq/L    Potassium 3.8  3.5 - 5.1 mEq/L    Chloride 100  96 - 112 mEq/L    CO2 29  19 - 32 mEq/L    Glucose, Bld 153 (*) 70 - 99 mg/dL    BUN 13  6 - 23 mg/dL    Creatinine, Ser 0.78  0.50 - 1.35 mg/dL    Calcium 8.9  8.4 - 10.5 mg/dL    Total Protein 6.8  6.0 - 8.3 g/dL    Albumin 2.9 (*) 3.5 - 5.2 g/dL    AST 47 (*) 0 - 37 U/L    ALT 29  0 - 53 U/L    Alkaline Phosphatase 193 (*) 39 - 117 U/L    Total Bilirubin 0.6  0.3 - 1.2 mg/dL    GFR calc non Af Amer >90  >90 mL/min    GFR calc Af Amer >90  >90 mL/min   URINALYSIS, ROUTINE W REFLEX MICROSCOPIC     Status: Abnormal   Collection Time   12/11/12  5:15 AM      Component Value Range Comment   Color, Urine YELLOW  YELLOW    APPearance CLEAR  CLEAR    Specific Gravity, Urine <1.005 (*) 1.005 - 1.030    pH 6.5  5.0 -  8.0    Glucose, UA NEGATIVE  NEGATIVE mg/dL    Hgb urine dipstick TRACE (*) NEGATIVE    Bilirubin Urine NEGATIVE  NEGATIVE    Ketones, ur NEGATIVE  NEGATIVE mg/dL    Protein, ur NEGATIVE  NEGATIVE mg/dL    Urobilinogen, UA 0.2  0.0 - 1.0 mg/dL    Nitrite NEGATIVE  NEGATIVE    Leukocytes, UA NEGATIVE  NEGATIVE   URINE MICROSCOPIC-ADD ON     Status: Normal   Collection Time   12/11/12  5:15 AM      Component Value Range Comment   RBC / HPF 0-2  <3 RBC/hpf    Bacteria, UA RARE  RARE     Mr Toes Right Wo/w Cm  12/10/2012  *RADIOLOGY REPORT*  Clinical Data: Right second toe redness, swelling and bruising. Clinical concern for osteomyelitis.  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  MRI OF THE RIGHT TOES WITHOUT AND WITH CONTRAST  Technique:  Multiplanar, multisequence MR imaging was performed both before and after administration of  intravenous contrast.  Contrast:  17 ml MultiHance  Comparison: Toe radiographs obtained earlier today.  Findings: Again demonstrated are second and third hammertoe deformities with proximal dislocations of the proximal phalanges relative to the metatarsals and flexion deformities of the toes.  There is also marrow edema in the distal aspect of the second metatarsal head.  There are also two erosions in the distal aspect of the second metatarsal head.  The larger of these measures 6 mm in maximum diameter with rim enhancement within the peripheral portions of this area and a small central area of non enhancement. There is enhancement of the overlying soft tissues and edema of the overlying soft tissues.  There is also focal subcortical enhancement in the distal aspect of the third metatarsal head, ventrally, with overlying soft tissue enhancement.  No soft tissue fluid collections are seen.  IMPRESSION: Osteomyelitis involving the distal aspects of the second and third metatarsal heads, as described above.   Original Report Authenticated By: Claudie Revering, M.D.    Dg Toe 2nd Right  12/10/2012  *RADIOLOGY REPORT*  Clinical Data: Contusion, redness, swelling  RIGTH SECOND TOE  Comparison: None.  Findings: Hammertoe deformities involving the second and third digits.  No fracture or dislocation is seen.  No radiographic findings to suggest osteomyelitis.  Vascular calcifications.  IMPRESSION: No radiographic findings to suggest osteomyelitis.   Original Report Authenticated By: Julian Hy, M.D.     Review of Systems  Constitutional: Negative.   HENT: Negative.   Eyes: Negative.   Respiratory: Negative.   Cardiovascular:       History of poor circulation of the feet.  Gastrointestinal: Negative.   Genitourinary: Negative.   Musculoskeletal:       History of long standing rheumatoid arthritis for years with multiple deformities of hand and feet.  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  He is post  amputation of the right fourth and fifth toes.  He has hammer toe deformities of the second and third toes on the right.    Skin: Negative.   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   Endo/Heme/Allergies:       Diabetes mellitus, insulin dependent for years.  Psychiatric/Behavioral: Negative.    Blood pressure 91/52, pulse 58, temperature 97.9 F (36.6 C), temperature source Oral, resp. rate 16, height 5' 11.5" (1.816 m), weight 78 kg (171  lb 15.3 oz), SpO2 100.00%. Physical Exam  Constitutional: He is oriented to person, place, and time. He appears well-developed and well-nourished.  HENT:  Head: Normocephalic and atraumatic.  Eyes: Conjunctivae normal and EOM are normal. Pupils are equal, round, and reactive to light.  Neck: Normal range of motion. Neck supple.  Cardiovascular: Normal rate, regular rhythm and intact distal pulses.   Respiratory: Effort normal and breath sounds normal.  GI: Soft. Bowel sounds are normal.  Musculoskeletal: He exhibits tenderness (Post amputation of the right fourth and fifth toes.  Promient second metatarsal head plantar.  Redness and swelling of the second toe  right wth hammertoe deformity and "scab" over PIP joint with no purulence expressed.  No redness of the foot .).       Feet:  Neurological: He is alert and oriented to person, place, and time. He has normal reflexes.  Skin: Skin is warm and dry.  Psychiatric: He has a normal mood and affect. His behavior is normal. Judgment and thought content normal.    Assessment/Plan: Cellulitis of the right second toe.  Possible osteomyelitis of the second and third metatarsals but this appearance could be present with severe rheumatoid arthritis.  He has no redness, discharge or swelling of this area.  Diabetes Mellitus, insulin dependent.  Peripheral neuropathy and peripheral vascular disease  Rheumatoid arthritis, severe  Plan:  PICC line placement.     Continue antibiotics IV To see his physician at [facility] for more definitive treatment if agreeable to his physician in [place].  [name] 12/11/2012, 7:41 AM

## 2012-12-11 NOTE — Progress Notes (Signed)
Subjective: This man was admitted yesterday with osteomyelitis affecting the distal aspects of the second and third metatarsal heads of the right foot. He is on intravenous Zosyn and vancomycin. He needs to still have a PICC line put in place.           Physical Exam: Blood pressure 91/52, pulse 58, temperature 97.9 F (36.6 C), temperature source Oral, resp. rate 16, height 5' 11.5" (1.816 m), weight 78 kg (171 lb 15.3 oz), SpO2 100.00%. Here she looks systemically well. He has got changes in his hands typical of quite severe rheumatoid arthritis. Is not toxic or septic. Heart sounds are present without gallop rhythm. Lung fields are clear. He is alert and orientated.   Investigations:  No results found for this or any previous visit (from the past 240 hour(s)).   Basic Metabolic Panel:  Basename 12/11/12 0436 12/10/12 1856  NA 136 135  K 3.8 4.5  CL 100 98  CO2 29 31  GLUCOSE 153* 170*  BUN 13 15  CREATININE 0.78 0.87  CALCIUM 8.9 9.4  MG -- --  PHOS -- --   Liver Function Tests:  [facility] 12/11/12 0436  AST 47*  ALT 29  ALKPHOS 193*  BILITOT 0.6  PROT 6.8  ALBUMIN 2.9*     CBC:  Basename 12/11/12 0436 12/10/12 1856  WBC 5.7 6.9  NEUTROABS -- 4.3  HGB 12.2* 13.0  HCT 35.7* 38.3*  MCV 92.0 92.7  PLT 171 197    Mr Toes Right Wo/w Cm  12/10/2012  *RADIOLOGY REPORT*  Clinical Data: Right second toe redness, swelling and bruising. Clinical concern for osteomyelitis.  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  MRI OF THE RIGHT TOES WITHOUT AND WITH CONTRAST  Technique:  Multiplanar, multisequence MR imaging was performed both before and after administration of intravenous contrast.  Contrast:  17 ml MultiHance  Comparison: Toe radiographs obtained earlier today.  Findings: Again demonstrated are second and third hammertoe deformities with proximal dislocations of the proximal phalanges relative to the metatarsals and  flexion deformities of the toes.  There is also marrow edema in the distal aspect of the second metatarsal head.  There are also two erosions in the distal aspect of the second metatarsal head.  The larger of these measures 6 mm in maximum diameter with rim enhancement within the peripheral portions of this area and a small central area of non enhancement. There is enhancement of the overlying soft tissues and edema of the overlying soft tissues.  There is also focal subcortical enhancement in the distal aspect of the third metatarsal head, ventrally, with overlying soft tissue enhancement.  No soft tissue fluid collections are seen.  IMPRESSION: Osteomyelitis involving the distal aspects of the second and third metatarsal heads, as described above.   Original Report Authenticated By: Claudie Revering, M.D.    Dg Toe 2nd Right  12/10/2012  *RADIOLOGY REPORT*  Clinical Data: Contusion, redness, swelling  RIGTH SECOND TOE  Comparison: None.  Findings: Hammertoe deformities involving the second and third digits.  No fracture or dislocation is seen.  No radiographic findings to suggest osteomyelitis.  Vascular calcifications.  IMPRESSION: No radiographic findings to suggest osteomyelitis.   Original Report Authenticated By: Julian Hy, M.D.       Medications: I have reviewed the patient's current medications.  Impression: 1. Osteomyelitis affecting the right foot as described above. 2. Type 2 diabetes mellitus. 3. Hypertension. 4. Rheumatoid arthritis. 5.  Coronary disease, stable.     Plan: 1. Continue with intravenous antibiotics. 2. PICC line today. 3. Possible discharge home today as he has an appointment with a surgeon on Friday at [facility], who will presumably operate if his osteomyelitis does not improve with conservative measures.     LOS: 1 day   Doree Albee Pager [phone]  12/11/2012, 8:25 AM

## 2013-03-26 DIAGNOSIS — S93129A Dislocation of metatarsophalangeal joint of unspecified toe(s), initial encounter: Secondary | ICD-10-CM | POA: Insufficient documentation

## 2014-05-22 ENCOUNTER — Encounter (HOSPITAL_COMMUNITY): Payer: Self-pay | Admitting: Emergency Medicine

## 2014-05-22 ENCOUNTER — Emergency Department (HOSPITAL_COMMUNITY)
Admission: EM | Admit: 2014-05-22 | Discharge: 2014-05-22 | Disposition: A | Discharge status: Home / Self Care | Payer: Medicare Other | Attending: Emergency Medicine | Admitting: Emergency Medicine

## 2014-05-22 DIAGNOSIS — Z79899 Other long term (current) drug therapy: Secondary | ICD-10-CM | POA: Insufficient documentation

## 2014-05-22 DIAGNOSIS — Z7982 Long term (current) use of aspirin: Secondary | ICD-10-CM | POA: Insufficient documentation

## 2014-05-22 DIAGNOSIS — Z8781 Personal history of (healed) traumatic fracture: Secondary | ICD-10-CM | POA: Insufficient documentation

## 2014-05-22 DIAGNOSIS — Y9389 Activity, other specified: Secondary | ICD-10-CM | POA: Insufficient documentation

## 2014-05-22 DIAGNOSIS — Z794 Long term (current) use of insulin: Secondary | ICD-10-CM | POA: Insufficient documentation

## 2014-05-22 DIAGNOSIS — Y929 Unspecified place or not applicable: Secondary | ICD-10-CM | POA: Insufficient documentation

## 2014-05-22 DIAGNOSIS — IMO0002 Reserved for concepts with insufficient information to code with codable children: Secondary | ICD-10-CM | POA: Insufficient documentation

## 2014-05-22 DIAGNOSIS — I252 Old myocardial infarction: Secondary | ICD-10-CM | POA: Insufficient documentation

## 2014-05-22 DIAGNOSIS — M069 Rheumatoid arthritis, unspecified: Secondary | ICD-10-CM | POA: Insufficient documentation

## 2014-05-22 DIAGNOSIS — S46911A Strain of unspecified muscle, fascia and tendon at shoulder and upper arm level, right arm, initial encounter: Secondary | ICD-10-CM

## 2014-05-22 DIAGNOSIS — X500XXA Overexertion from strenuous movement or load, initial encounter: Secondary | ICD-10-CM | POA: Insufficient documentation

## 2014-05-22 DIAGNOSIS — I10 Essential (primary) hypertension: Secondary | ICD-10-CM | POA: Insufficient documentation

## 2014-05-22 DIAGNOSIS — E1142 Type 2 diabetes mellitus with diabetic polyneuropathy: Secondary | ICD-10-CM | POA: Insufficient documentation

## 2014-05-22 DIAGNOSIS — I2510 Atherosclerotic heart disease of native coronary artery without angina pectoris: Secondary | ICD-10-CM | POA: Insufficient documentation

## 2014-05-22 DIAGNOSIS — E1149 Type 2 diabetes mellitus with other diabetic neurological complication: Secondary | ICD-10-CM | POA: Insufficient documentation

## 2014-05-22 DIAGNOSIS — Z9861 Coronary angioplasty status: Secondary | ICD-10-CM | POA: Insufficient documentation

## 2014-05-22 MED ORDER — HYDROCODONE-ACETAMINOPHEN 5-325 MG PO TABS
2.0000 | ORAL_TABLET | Freq: Once | ORAL | Status: AC
  Administered 2014-05-22: 2 via ORAL
  Filled 2014-05-22: qty 2

## 2014-05-22 MED ORDER — IBUPROFEN 800 MG PO TABS: 800.0000 mg | ORAL_TABLET | Freq: Three times a day (TID) | ORAL | Status: DC

## 2014-05-22 MED ORDER — HYDROCODONE-ACETAMINOPHEN 5-325 MG PO TABS: 1.0000 | ORAL_TABLET | ORAL | Status: DC | PRN

## 2014-05-22 MED ORDER — IBUPROFEN 800 MG PO TABS
800.0000 mg | ORAL_TABLET | Freq: Once | ORAL | Status: AC
  Administered 2014-05-22: 800 mg via ORAL
  Filled 2014-05-22: qty 1

## 2014-05-22 NOTE — Discharge Instructions (Signed)
Your examination is consistent with muscle strain involving the right shoulder area. The possibility of an injury to your rotator cuff is also entertained. Please see your physician at [facility] if not improving with current sling and medication. Muscle Strain A muscle strain (pulled muscle) happens when a muscle is stretched beyond normal length. It happens when a sudden, violent force stretches your muscle too far. Usually, a few of the fibers in your muscle are torn. Muscle strain is common in athletes. Recovery usually takes 1 2 weeks. Complete healing takes 5 6 weeks.  HOME CARE   Follow the PRICE method of treatment to help your injury get better. Do this the first 2 3 days after the injury:  Protect. Protect the muscle to keep it from getting injured again.  Rest. Limit your activity and rest the injured body part.  Ice. Put ice in a plastic bag. Place a towel between your skin and the bag. Then, apply the ice and leave it on from 15 20 minutes each hour. After the third day, switch to moist heat packs.  Compression. Use a splint or elastic bandage on the injured area for comfort. Do not put it on too tightly.  Elevate. Keep the injured body part above the level of your heart.  Only take medicine as told by your doctor.  Warm up before doing exercise to prevent future muscle strains. GET HELP IF:   You have more pain or puffiness (swelling) in the injured area.  You feel numbness, tingling, or notice a loss of strength in the injured area. MAKE SURE YOU:   Understand these instructions.  Will watch your condition.  Will get help right away if you are not doing well or get worse. Document Released: 09/20/2008 Document Revised: 10/02/2013 Document Reviewed: 07/11/2013 [facility] Patient Information 2014 [place], [place].
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
## 2014-06-17 ENCOUNTER — Emergency Department (HOSPITAL_COMMUNITY)
Admission: EM | Admit: 2014-06-17 | Discharge: 2014-06-18 | Disposition: A | Discharge status: Other Acute Inpatient Hospital | Payer: Medicare Other | Attending: Emergency Medicine | Admitting: Emergency Medicine

## 2014-06-17 DIAGNOSIS — R400 Somnolence: Secondary | ICD-10-CM

## 2014-06-17 DIAGNOSIS — I2510 Atherosclerotic heart disease of native coronary artery without angina pectoris: Secondary | ICD-10-CM | POA: Insufficient documentation

## 2014-06-17 DIAGNOSIS — R945 Abnormal results of liver function studies: Secondary | ICD-10-CM

## 2014-06-17 DIAGNOSIS — A419 Sepsis, unspecified organism: Secondary | ICD-10-CM

## 2014-06-17 DIAGNOSIS — R7989 Other specified abnormal findings of blood chemistry: Secondary | ICD-10-CM

## 2014-06-17 DIAGNOSIS — Z9889 Other specified postprocedural states: Secondary | ICD-10-CM | POA: Insufficient documentation

## 2014-06-17 DIAGNOSIS — M069 Rheumatoid arthritis, unspecified: Secondary | ICD-10-CM | POA: Insufficient documentation

## 2014-06-17 DIAGNOSIS — IMO0002 Reserved for concepts with insufficient information to code with codable children: Secondary | ICD-10-CM | POA: Insufficient documentation

## 2014-06-17 DIAGNOSIS — Z7982 Long term (current) use of aspirin: Secondary | ICD-10-CM | POA: Insufficient documentation

## 2014-06-17 DIAGNOSIS — Z794 Long term (current) use of insulin: Secondary | ICD-10-CM | POA: Insufficient documentation

## 2014-06-17 DIAGNOSIS — E1142 Type 2 diabetes mellitus with diabetic polyneuropathy: Secondary | ICD-10-CM | POA: Insufficient documentation

## 2014-06-17 DIAGNOSIS — E1149 Type 2 diabetes mellitus with other diabetic neurological complication: Secondary | ICD-10-CM | POA: Insufficient documentation

## 2014-06-17 DIAGNOSIS — N179 Acute kidney failure, unspecified: Secondary | ICD-10-CM

## 2014-06-17 DIAGNOSIS — R404 Transient alteration of awareness: Secondary | ICD-10-CM | POA: Insufficient documentation

## 2014-06-17 DIAGNOSIS — Z79899 Other long term (current) drug therapy: Secondary | ICD-10-CM | POA: Insufficient documentation

## 2014-06-17 DIAGNOSIS — I10 Essential (primary) hypertension: Secondary | ICD-10-CM | POA: Insufficient documentation

## 2014-06-17 DIAGNOSIS — Z8781 Personal history of (healed) traumatic fracture: Secondary | ICD-10-CM | POA: Insufficient documentation

## 2014-06-17 NOTE — ED Notes (Signed)
Pt via ems for unresponsiveness. Per EMS:  Pts wife gave patient his night time medications, and was unable to wake patient up. Pt responds to painful stimuli. Per EMS pts glucose-[id number]. Upon arrival to ED pt has eyes closed, even non labored respirations and responds to painful and verbal stimuli

## 2014-06-18 ENCOUNTER — Emergency Department (HOSPITAL_COMMUNITY): Payer: Medicare Other

## 2014-06-18 ENCOUNTER — Encounter (HOSPITAL_COMMUNITY): Payer: Self-pay | Admitting: Emergency Medicine

## 2014-06-18 DIAGNOSIS — R4182 Altered mental status, unspecified: Secondary | ICD-10-CM | POA: Insufficient documentation

## 2014-06-18 DIAGNOSIS — N179 Acute kidney failure, unspecified: Secondary | ICD-10-CM

## 2014-06-18 LAB — ETHANOL: Alcohol, Ethyl (B): <11 mg/dL (ref 0–11)

## 2014-06-18 LAB — URINALYSIS, ROUTINE W REFLEX MICROSCOPIC
Bilirubin Urine: NEGATIVE
Glucose, UA: NEGATIVE mg/dL
Ketones, ur: NEGATIVE mg/dL
Leukocytes, UA: NEGATIVE
NITRITE: NEGATIVE
SPECIFIC GRAVITY, URINE: 1.01 (ref 1.005–1.030)
Urobilinogen, UA: 0.2 mg/dL (ref 0.0–1.0)
pH: 6 (ref 5.0–8.0)

## 2014-06-18 LAB — COMPREHENSIVE METABOLIC PANEL
ALT: 478 U/L — ABNORMAL HIGH (ref 0–53)
AST: 453 U/L — ABNORMAL HIGH (ref 0–37)
Albumin: 2.7 g/dL — ABNORMAL LOW (ref 3.5–5.2)
Alkaline Phosphatase: 278 U/L — ABNORMAL HIGH (ref 39–117)
BUN: 47 mg/dL — ABNORMAL HIGH (ref 6–23)
CO2: 28 mEq/L (ref 19–32)
Calcium: 8.5 mg/dL (ref 8.4–10.5)
Chloride: 89 mEq/L — ABNORMAL LOW (ref 96–112)
Creatinine, Ser: 2.88 mg/dL — ABNORMAL HIGH (ref 0.50–1.35)
GFR calc Af Amer: 25 mL/min — ABNORMAL LOW (ref >90)
GFR calc non Af Amer: 22 mL/min — ABNORMAL LOW (ref >90)
Glucose, Bld: 254 mg/dL — ABNORMAL HIGH (ref 70–99)
Potassium: 3.9 mEq/L (ref 3.7–5.3)
Sodium: 128 mEq/L — ABNORMAL LOW (ref 137–147)
Total Bilirubin: 0.7 mg/dL (ref 0.3–1.2)
Total Protein: 6.6 g/dL (ref 6.0–8.3)

## 2014-06-18 LAB — CBC WITH DIFFERENTIAL/PLATELET
BASOS PCT: 1 % (ref 0–1)
Basophils Absolute: 0 10*3/uL (ref 0.0–0.1)
EOS PCT: 0 % (ref 0–5)
Eosinophils Absolute: 0 10*3/uL (ref 0.0–0.7)
HEMATOCRIT: 24.6 % — AB (ref 39.0–52.0)
Hemoglobin: 8.5 g/dL — ABNORMAL LOW (ref 13.0–17.0)
LYMPHS ABS: 1.2 10*3/uL (ref 0.7–4.0)
Lymphocytes Relative: 49 % — ABNORMAL HIGH (ref 12–46)
MCH: 30.5 pg (ref 26.0–34.0)
MCHC: 34.6 g/dL (ref 30.0–36.0)
MCV: 88.2 fL (ref 78.0–100.0)
MONO ABS: 0.4 10*3/uL (ref 0.1–1.0)
Monocytes Relative: 17 % — ABNORMAL HIGH (ref 3–12)
NEUTROS ABS: 0.8 10*3/uL — AB (ref 1.7–7.7)
Neutrophils Relative %: 33 % — ABNORMAL LOW (ref 43–77)
PLATELETS: 80 10*3/uL — AB (ref 150–400)
RBC: 2.79 MIL/uL — AB (ref 4.22–5.81)
RDW: 15.4 % (ref 11.5–15.5)
WBC: 2.4 10*3/uL — ABNORMAL LOW (ref 4.0–10.5)

## 2014-06-18 LAB — BLOOD GAS, ARTERIAL
ACID-BASE EXCESS: 3.2 mmol/L — AB (ref 0.0–2.0)
BICARBONATE: 25.7 meq/L — AB (ref 20.0–24.0)
Drawn by: 22223
O2 Content: 2 L/min
O2 Saturation: 99.4 %
PATIENT TEMPERATURE: 37
PO2 ART: 146 mmHg — AB (ref 80.0–100.0)
TCO2: 24 mmol/L (ref 0–100)
pCO2 arterial: 29.4 mmHg — ABNORMAL LOW (ref 35.0–45.0)
pH, Arterial: 7.55 — ABNORMAL HIGH (ref 7.350–7.450)

## 2014-06-18 LAB — URINE MICROSCOPIC-ADD ON

## 2014-06-18 LAB — AMMONIA: Ammonia: 27 umol/L (ref 11–60)

## 2014-06-18 LAB — RAPID URINE DRUG SCREEN, HOSP PERFORMED
Amphetamines: NOT DETECTED
Barbiturates: NOT DETECTED
Benzodiazepines: NOT DETECTED
Cocaine: NOT DETECTED
Opiates: NOT DETECTED
Tetrahydrocannabinol: NOT DETECTED

## 2014-06-18 LAB — TROPONIN I

## 2014-06-18 LAB — SALICYLATE LEVEL: Salicylate Lvl: <2 mg/dL — ABNORMAL LOW (ref 2.8–20.0)

## 2014-06-18 LAB — LACTIC ACID, PLASMA: LACTIC ACID, VENOUS: 1.8 mmol/L (ref 0.5–2.2)

## 2014-06-18 LAB — ACETAMINOPHEN LEVEL

## 2014-06-18 LAB — CBG MONITORING, ED: Glucose-Capillary: 174 mg/dL — ABNORMAL HIGH (ref 70–99)

## 2014-06-18 MED ORDER — PIPERACILLIN-TAZOBACTAM 3.375 G IVPB 30 MIN
3.3750 g | Freq: Once | INTRAVENOUS | Status: AC
  Administered 2014-06-18: 3.375 g via INTRAVENOUS
  Filled 2014-06-18: qty 50

## 2014-06-18 MED ORDER — SODIUM CHLORIDE 0.9 % IV BOLUS (SEPSIS)
1000.0000 mL | Freq: Once | INTRAVENOUS | Status: AC
  Administered 2014-06-18: 1000 mL via INTRAVENOUS

## 2014-06-18 MED ORDER — VANCOMYCIN HCL IN DEXTROSE 1-5 GM/200ML-% IV SOLN
1000.0000 mg | Freq: Once | INTRAVENOUS | Status: AC
  Administered 2014-06-18: 1000 mg via INTRAVENOUS
  Filled 2014-06-18: qty 200

## 2014-06-18 NOTE — ED Notes (Signed)
Patients dentures, medications, and underwear given to patients spouse

## 2014-06-18 NOTE — ED Notes (Addendum)
pts family sts that patient begin to "act strange" starting 2 days ago.  And that the patient became more lethargic over the past few days

## 2014-06-18 NOTE — ED Notes (Signed)
Pt from home via EMS. Per EMS, pts spouse gave him his sleeping medication, and was then unable to wake patient up. Pt responds to verbal and painful stimuli. Pt alert and oriented X4 but lethargic with slurred speech. Lung sounds are clear and respirations are even and non labored at this time.

## 2014-06-18 NOTE — ED Provider Notes (Signed)
CSN: [id number]     Arrival date & time 06/17/14  2345 History  This chart was scribed for Veryl Speak, MD by Girtha Hake, ED Scribe. The patient was seen in [place]. The patient's care was started at 12:01 AM.     Chief Complaint  Patient presents with  . Altered Mental Status    per ems pt is altered, drowzy and hard to arrouse  Level 5 Caveat due to altered mental status.   Patient is a 63 y.o. male presenting with altered mental status.  Altered Mental Status  HPI Comments: [name] is a 63 y.o. male with a history of DM who presents to the Emergency Department complaining of altered mental status. He reports that he had a "sleeping pill" earlier tonight. Patient is very drowsy and unable to give a clear history.   Past Medical History  Diagnosis Date  . Rheumatoid arthritis(714.0)   . Coronary artery disease 1993    angioplasty after AMI  . Diabetes mellitus   . Hypertension   . Diabetic neuropathy    Past Surgical History  Procedure Laterality Date  . Joint replacement    . Fracture surgery    . Cardiac surgery     Family History  Problem Relation Age of Onset  . Diabetes Father   . Dementia Father   . Heart attack Brother     multiple brothers with MIs  . Hypertension Mother   . Stroke Mother    History  Substance Use Topics  . Smoking status: Never Smoker   . Smokeless tobacco: Not on file  . Alcohol Use: No    Review of Systems  Constitutional:       Very drowsy.  All other systems reviewed and are negative.     Allergies  Sulfa antibiotics  Home Medications   Prior to Admission medications   Medication Sig Start Date End Date Taking? Authorizing Provider  amitriptyline (ELAVIL) 50 MG tablet Take 150 mg by mouth at bedtime.    Historical Provider, MD  aspirin 81 MG chewable tablet Chew 81 mg by mouth every morning.     Historical Provider, MD  atenolol (TENORMIN) 25 MG tablet Take 25 mg by mouth 2 (two) times daily.    Historical  Provider, MD  atorvastatin (LIPITOR) 80 MG tablet Take 80 mg by mouth at bedtime.     Historical Provider, MD  furosemide (LASIX) 20 MG tablet Take 20 mg by mouth 2 (two) times daily.    Historical Provider, MD  gabapentin (NEURONTIN) 300 MG capsule Take 300 mg by mouth 3 (three) times daily.    Historical Provider, MD  HYDROcodone-acetaminophen (NORCO/VICODIN) 5-325 MG per tablet Take 1 tablet by mouth every 4 (four) hours as needed for moderate pain. 05/22/14   Lenox Ahr, PA-C  hydroxychloroquine (PLAQUENIL) 200 MG tablet Take 200 mg by mouth 2 (two) times daily.    Historical Provider, MD  hydroxypropyl methylcellulose (ISOPTO TEARS) 2.5 % ophthalmic solution Place 1 drop into both eyes daily as needed. For dry eye relief    Historical Provider, MD  ibuprofen (ADVIL,MOTRIN) 800 MG tablet Take 1 tablet (800 mg total) by mouth 3 (three) times daily. 05/22/14   Lenox Ahr, PA-C  insulin lispro protamine-insulin lispro (HUMALOG 75/25) (75-25) 100 UNIT/ML SUSP Inject 12-20 Units into the skin 2 (two) times daily with a meal. Take 20 units in the morning and 12 units each night    Historical Provider, MD  methadone (  DOLOPHINE) 10 MG tablet Take 10 mg by mouth 4 (four) times daily.    Historical Provider, MD  methadone (DOLOPHINE) 5 MG tablet Take 5 mg by mouth as needed. For breakthrough pain    Historical Provider, MD  predniSONE (DELTASONE) 5 MG tablet Take 5 mg by mouth every morning.     Historical Provider, MD  sulfaSALAzine (AZULFIDINE) 500 MG tablet Take 500 mg by mouth 2 (two) times daily.    Historical Provider, MD  Tamsulosin HCl (FLOMAX) 0.4 MG CAPS Take 0.4 mg by mouth at bedtime.     Historical Provider, MD   Triage Vitals: BP 122/68  Pulse 59  Temp(Src) 98.2 F (36.8 C) (Oral)  Resp 16  Ht 5\' 8"  (1.727 m)  Wt 175 lb (79.379 kg)  BMI 26.61 kg/m2  SpO2 100% Physical Exam  Nursing note and vitals reviewed. Constitutional: He appears well-developed and well-nourished. No  distress.  Patient is a 63 y.o male who is somnolent, but will respond to voice with garbled speech.   HENT:  Head: Normocephalic and atraumatic.  Mouth/Throat: Oropharynx is clear and moist.  Eyes: Conjunctivae and EOM are normal. Pupils are equal, round, and reactive to light.  Neck: Neck supple. No tracheal deviation present.  Cardiovascular: Normal rate and regular rhythm.  Exam reveals no gallop and no friction rub.   No murmur heard. Pulmonary/Chest: Effort normal and breath sounds normal. No respiratory distress. He has no wheezes. He has no rales.  Abdominal: Soft. Bowel sounds are normal. He exhibits no distension. There is no tenderness. There is no rebound and no guarding.  Musculoskeletal: Normal range of motion.  Skin: Skin is warm and dry.    ED Course  Procedures (including critical care time) DIAGNOSTIC STUDIES: Oxygen Saturation is 100% on room air, normal by my interpretation.    COORDINATION OF CARE: 12:08 AM-Discussed treatment plan which includes head CT w/o Contrast and labs with pt at bedside and pt agreed to plan.     Labs Review Labs Reviewed - No data to display  Imaging Review No results found.   EKG Interpretation   Date/Time:  Wednesday June 18 2014 01:26:33 EDT Ventricular Rate:  53 PR Interval:  150 QRS Duration: 122 QT Interval:  518 QTC Calculation: 486 R Axis:   -2 Text Interpretation:  Sinus rhythm Nonspecific intraventricular conduction  delay Borderline repolarization abnormality Confirmed by DELOS  MD,  DOUGLAS ([id number]) on 06/18/2014 2:22:48 AM      MDM   Final diagnoses:  None    Patient is a 63 year old male with history of rheumatoid arthritis, coronary artery disease, diabetes, hypertension. He is brought here for evaluation of decreased mental status. He apparently had a fever yesterday, then today developed somnolence and was difficult to arouse.  Workup today reveals white count of 2.4 with greater than 20% bands. He is  also found to be pancytopenic with a hemoglobin of 8.5 and platelet count of 80. His electrolytes reveal worsening renal function and elevated LFTs, concerning for multiorgan failure. I am uncertain as to the exact etiology of this, however due to the fever yesterday he will be treated with vancomycin and Zosyn for presumed sepsis. When the patient is aroused he becomes somewhat agitated and LP will require some time in likely sedation. Also due to his history of rheumatoid, I am anticipating a difficult lumbar puncture. For this reason, I have decided to initiate antibiotic therapy to make sure this occurs in a timely fashion.  The patient  was evaluated by Dr. Maryland Pink from the hospitalist service. The family expressed to him a desire for him to be cared by his physicians in [facility] that know him best. I've spoken with a Dr. Bertram Millard long in the emergency department at [facility] who agrees to accept the patient in transfer.  The patient remains somnolent, however when he is aroused he is answering questions and responding appropriately. He appears to be protecting his airway and I believe is stable for transport.  I personally performed the services described in this documentation, which was scribed in my presence. The recorded information has been reviewed and is accurate.   CRITICAL CARE Performed by: Veryl Speak Total critical care time: 60 minutes Critical care time was exclusive of separately billable procedures and treating other patients. Critical care was necessary to treat or prevent imminent or life-threatening deterioration. Critical care was time spent personally by me on the following activities: development of treatment plan with patient and/or surrogate as well as nursing, discussions with consultants, evaluation of patient's response to treatment, examination of patient, obtaining history from patient or surrogate, ordering and performing treatments and interventions, ordering and review of  laboratory studies, ordering and review of radiographic studies, pulse oximetry and re-evaluation of patient's condition.    Veryl Speak, MD 06/18/14 [phone]

## 2014-06-18 NOTE — ED Notes (Signed)
Patient beginning to have periods of apnea with deep, heavy respirations. Patients family remains at bedside at this time. O2 remains at 100% on 2L/M of O2

## 2014-06-18 NOTE — Progress Notes (Signed)
Asked by Dr. Stark Jock to evaluate [name]. [name] was unresponsive. Patient was not fully evaluated because upon speaking with family, they were interested in transfer to [facility] where all his providers were located. This was conveyed to Dr. Stark Jock. He will call [facility]. [facility] is available if needed.   [name] 06/18/2014

## 2014-06-19 LAB — URINE CULTURE
COLONY COUNT: NO GROWTH
Culture: NO GROWTH

## 2014-06-23 LAB — CULTURE, BLOOD (ROUTINE X 2)
CULTURE: NO GROWTH
Culture: NO GROWTH

## 2014-08-12 ENCOUNTER — Encounter (HOSPITAL_COMMUNITY): Payer: Self-pay | Admitting: Emergency Medicine

## 2014-08-12 ENCOUNTER — Observation Stay (HOSPITAL_COMMUNITY)
Admission: EM | Admit: 2014-08-12 | Discharge: 2014-08-14 | Disposition: A | Discharge status: Home / Self Care | Payer: Medicare Other | Attending: Emergency Medicine | Admitting: Emergency Medicine

## 2014-08-12 DIAGNOSIS — R001 Bradycardia, unspecified: Secondary | ICD-10-CM | POA: Diagnosis not present

## 2014-08-12 DIAGNOSIS — Z794 Long term (current) use of insulin: Secondary | ICD-10-CM | POA: Insufficient documentation

## 2014-08-12 DIAGNOSIS — R9431 Abnormal electrocardiogram [ECG] [EKG]: Secondary | ICD-10-CM | POA: Diagnosis present

## 2014-08-12 DIAGNOSIS — I2510 Atherosclerotic heart disease of native coronary artery without angina pectoris: Secondary | ICD-10-CM | POA: Diagnosis present

## 2014-08-12 DIAGNOSIS — N179 Acute kidney failure, unspecified: Secondary | ICD-10-CM | POA: Diagnosis present

## 2014-08-12 DIAGNOSIS — IMO0002 Reserved for concepts with insufficient information to code with codable children: Secondary | ICD-10-CM | POA: Diagnosis not present

## 2014-08-12 DIAGNOSIS — Z79899 Other long term (current) drug therapy: Secondary | ICD-10-CM | POA: Insufficient documentation

## 2014-08-12 DIAGNOSIS — E1142 Type 2 diabetes mellitus with diabetic polyneuropathy: Secondary | ICD-10-CM | POA: Diagnosis not present

## 2014-08-12 DIAGNOSIS — I10 Essential (primary) hypertension: Secondary | ICD-10-CM | POA: Diagnosis present

## 2014-08-12 DIAGNOSIS — R269 Unspecified abnormalities of gait and mobility: Secondary | ICD-10-CM | POA: Diagnosis present

## 2014-08-12 DIAGNOSIS — Z7982 Long term (current) use of aspirin: Secondary | ICD-10-CM | POA: Diagnosis not present

## 2014-08-12 DIAGNOSIS — N183 Chronic kidney disease, stage 3 unspecified: Secondary | ICD-10-CM | POA: Diagnosis present

## 2014-08-12 DIAGNOSIS — M069 Rheumatoid arthritis, unspecified: Secondary | ICD-10-CM | POA: Diagnosis present

## 2014-08-12 DIAGNOSIS — Z9861 Coronary angioplasty status: Secondary | ICD-10-CM | POA: Insufficient documentation

## 2014-08-12 DIAGNOSIS — E1149 Type 2 diabetes mellitus with other diabetic neurological complication: Secondary | ICD-10-CM | POA: Insufficient documentation

## 2014-08-12 DIAGNOSIS — R569 Unspecified convulsions: Principal | ICD-10-CM | POA: Diagnosis present

## 2014-08-12 DIAGNOSIS — R000 Tachycardia, unspecified: Secondary | ICD-10-CM | POA: Insufficient documentation

## 2014-08-12 DIAGNOSIS — E119 Type 2 diabetes mellitus without complications: Secondary | ICD-10-CM

## 2014-08-12 DIAGNOSIS — D649 Anemia, unspecified: Secondary | ICD-10-CM | POA: Diagnosis present

## 2014-08-12 DIAGNOSIS — E1129 Type 2 diabetes mellitus with other diabetic kidney complication: Secondary | ICD-10-CM | POA: Diagnosis present

## 2014-08-12 DIAGNOSIS — D61818 Other pancytopenia: Secondary | ICD-10-CM | POA: Diagnosis present

## 2014-08-12 HISTORY — DX: Abnormal electrocardiogram (ECG) (EKG): R94.31

## 2014-08-12 HISTORY — DX: Other pancytopenia: D61.818

## 2014-08-12 HISTORY — DX: Unspecified convulsions: R56.9

## 2014-08-12 LAB — CBC WITH DIFFERENTIAL/PLATELET
Basophils Absolute: 0 10*3/uL (ref 0.0–0.1)
Basophils Relative: 1 % (ref 0–1)
EOS PCT: 1 % (ref 0–5)
Eosinophils Absolute: 0 10*3/uL (ref 0.0–0.7)
HEMATOCRIT: 25.5 % — AB (ref 39.0–52.0)
Hemoglobin: 8.2 g/dL — ABNORMAL LOW (ref 13.0–17.0)
Lymphocytes Relative: 21 % (ref 12–46)
Lymphs Abs: 0.9 10*3/uL (ref 0.7–4.0)
MCH: 30.9 pg (ref 26.0–34.0)
MCHC: 32.2 g/dL (ref 30.0–36.0)
MCV: 96.2 fL (ref 78.0–100.0)
MONO ABS: 0.3 10*3/uL (ref 0.1–1.0)
MONOS PCT: 6 % (ref 3–12)
Neutro Abs: 3.2 10*3/uL (ref 1.7–7.7)
Neutrophils Relative %: 71 % (ref 43–77)
PLATELETS: 98 10*3/uL — AB (ref 150–400)
RBC: 2.65 MIL/uL — AB (ref 4.22–5.81)
RDW: 14.6 % (ref 11.5–15.5)
WBC: 4.4 10*3/uL (ref 4.0–10.5)

## 2014-08-12 LAB — BASIC METABOLIC PANEL
Anion gap: 9 (ref 5–15)
BUN: 16 mg/dL (ref 6–23)
CHLORIDE: 99 meq/L (ref 96–112)
CO2: 28 mEq/L (ref 19–32)
Calcium: 8.6 mg/dL (ref 8.4–10.5)
Creatinine, Ser: 1.55 mg/dL — ABNORMAL HIGH (ref 0.50–1.35)
GFR calc Af Amer: 53 mL/min — ABNORMAL LOW (ref >90)
GFR, EST NON AFRICAN AMERICAN: 46 mL/min — AB (ref >90)
GLUCOSE: 161 mg/dL — AB (ref 70–99)
POTASSIUM: 5.1 meq/L (ref 3.7–5.3)
SODIUM: 136 meq/L — AB (ref 137–147)

## 2014-08-12 LAB — ETHANOL: Alcohol, Ethyl (B): <11 mg/dL (ref 0–11)

## 2014-08-12 MED ORDER — SODIUM CHLORIDE 0.9 % IV SOLN: INTRAVENOUS | Status: DC

## 2014-08-12 NOTE — ED Provider Notes (Signed)
CSN: [id number]     Arrival date & time 08/12/14  2018 History  This chart was scribed for Richarda Blade, MD by Delphia Grates, ED Scribe. This patient was seen in room APA02/APA02 and the patient's care was started at 8:49 PM.    Chief Complaint  Patient presents with  . Seizures     The history is provided by the patient. No language interpreter was used.   HPI Comments: [name] is a 63 y.o. male brought in by ambulance, who presents to the Emergency Department complaining of seizures that occurred at approximately 1930. Per wife and daughter, patient will experience spells where he will stop talking and stare. Today, they report  patient was unresponsive with a "fixed gaze", in addition to twitching on the left side of his body. They report this episode lasted approximately 2 minutes with patient returning to baseline after 4-5 minutes. Family reports these episodes have been recurrent since June 2015 where patient experienced similar symptoms which included twitching on both sides of his body. They also report that prior to his first spell, the patient suffered a tick bite, and was seen at [facility] where he was diagnosed and treated for Ehrlichiosis. Patient denies any symptoms at this time. He reports he is able to ambulate well with a cane. He denies fever cough, sob, nausea, emesis.Patient has history of CAD, rheumatoid arthritis, DM, HTN, diabetic neuropathy. Patient currently takes Methadone 10 mg and Methadone 5 mg for pain.    Past Medical History  Diagnosis Date  . Rheumatoid arthritis(714.0)   . Coronary artery disease 1993    angioplasty after AMI  . Diabetes mellitus   . Hypertension   . Diabetic neuropathy    Past Surgical History  Procedure Laterality Date  . Joint replacement    . Fracture surgery    . Cardiac surgery     Family History  Problem Relation Age of Onset  . Diabetes Father   . Dementia Father   . Heart attack Brother     multiple brothers  with MIs  . Hypertension Mother   . Stroke Mother    History  Substance Use Topics  . Smoking status: Never Smoker   . Smokeless tobacco: Not on file  . Alcohol Use: No    Review of Systems  Constitutional: Negative for fever.  Neurological: Positive for seizures.  Psychiatric/Behavioral: Negative for confusion.  All other systems reviewed and are negative.     Allergies  Sulfa antibiotics  Home Medications   Prior to Admission medications   Medication Sig Start Date End Date Taking? Authorizing Provider  amitriptyline (ELAVIL) 50 MG tablet Take 75 mg by mouth at bedtime.    Yes Historical Provider, MD  aspirin 81 MG chewable tablet Chew 81 mg by mouth every morning.    Yes Historical Provider, MD  atenolol (TENORMIN) 25 MG tablet Take 25 mg by mouth 2 (two) times daily.   Yes Historical Provider, MD  atorvastatin (LIPITOR) 80 MG tablet Take 80 mg by mouth at bedtime.    Yes Historical Provider, MD  gabapentin (NEURONTIN) 300 MG capsule Take 300 mg by mouth 3 (three) times daily.   Yes Historical Provider, MD  hydroxychloroquine (PLAQUENIL) 200 MG tablet Take 200 mg by mouth 2 (two) times daily.   Yes Historical Provider, MD  hydroxypropyl methylcellulose (ISOPTO TEARS) 2.5 % ophthalmic solution Place 1 drop into both eyes daily as needed. For dry eye relief   Yes Historical Provider,  MD  insulin NPH-regular Human (NOVOLIN 70/30) (70-30) 100 UNIT/ML injection Inject 10-12 Units into the skin 2 (two) times daily. 10 units in the morning and 12 units at bedtime   Yes Historical Provider, MD  ketoconazole (NIZORAL) 2 % cream Apply 1 application topically 2 (two) times daily. 03/07/13  Yes Historical Provider, MD  methadone (DOLOPHINE) 10 MG tablet Take 10 mg by mouth 4 (four) times daily.   Yes Historical Provider, MD  methadone (DOLOPHINE) 5 MG tablet Take 5 mg by mouth as needed. For breakthrough pain   Yes Historical Provider, MD  polyethylene glycol powder (MIRALAX) powder Take  17 g by mouth daily. 12/20/12  Yes Historical Provider, MD  predniSONE (DELTASONE) 5 MG tablet Take 5 mg by mouth every morning.    Yes Historical Provider, MD  Tamsulosin HCl (FLOMAX) 0.4 MG CAPS Take 0.4 mg by mouth at bedtime.    Yes Historical Provider, MD   Triage Vitals: BP 115/67  Pulse 58  Temp(Src) 98.4 F (36.9 C) (Oral)  Resp 15  Ht 5\' 10"  (1.778 m)  Wt 175 lb (79.379 kg)  BMI 25.11 kg/m2  SpO2 97%  Physical Exam  Nursing note and vitals reviewed. Constitutional: He is oriented to person, place, and time. He appears well-developed and well-nourished.  HENT:  Head: Normocephalic and atraumatic.  Right Ear: External ear normal.  Left Ear: External ear normal.  Mouth/Throat: Mucous membranes are dry.  Eyes: Conjunctivae and EOM are normal. Pupils are equal, round, and reactive to light.  Neck: Normal range of motion and phonation normal. Neck supple.  Cardiovascular: Normal rate, regular rhythm and intact distal pulses.   Murmur heard.  Systolic murmur is present with a grade of 2/6  2/6 systolic murmur at the base of the heart.  Pulmonary/Chest: Effort normal and breath sounds normal. He exhibits no bony tenderness.  Abdominal: Soft. There is no tenderness.  Musculoskeletal: Normal range of motion. He exhibits no edema and no tenderness.  Hand deformities consistent with rheumatoid arthritis  Neurological: He is alert and oriented to person, place, and time. No cranial nerve deficit or sensory deficit. He exhibits normal muscle tone. Coordination normal.  No dysarthria, no aphasia, no nystagmus.  Skin: Skin is warm, dry and intact.  Small bruise right lateral eyebrow.  Psychiatric: He has a normal mood and affect. His behavior is normal. Judgment and thought content normal.    ED Course  Procedures (including critical care time)  Medications  0.9 %  sodium chloride infusion (not administered)    Patient Vitals for the past 24 hrs:  BP Temp Temp src Pulse Resp  SpO2 Height Weight  08/13/14 0004 151/74 mmHg - - 57 18 100 % - -  08/12/14 2115 - - - 53 14 94 % - -  08/12/14 2100 109/71 mmHg - - 58 22 98 % - -  08/12/14 2045 - - - 55 12 97 % - -  08/12/14 2030 106/73 mmHg - - - 17 - - -  08/12/14 2023 115/67 mmHg 98.4 F (36.9 C) Oral 58 15 97 % 5\' 10"  (1.778 m) 175 lb (79.379 kg)    9:01 PM-Discussed treatment plan which includes CBC panel, UA, and basic metabolic panel with pt at bedside and pt agreed to plan.      12:15 AM-Consult complete with Dr. Nehemiah Settle. Patient case explained and discussed. He agrees to admit patient for further evaluation and treatment. Call ended at 00:20   Labs Review Labs Reviewed  CBC WITH DIFFERENTIAL - Abnormal; Notable for the following:    RBC 2.65 (*)    Hemoglobin 8.2 (*)    HCT 25.5 (*)    Platelets 98 (*)    All other components within normal limits  BASIC METABOLIC PANEL - Abnormal; Notable for the following:    Sodium 136 (*)    Glucose, Bld 161 (*)    Creatinine, Ser 1.55 (*)    GFR calc non Af Amer 46 (*)    GFR calc Af Amer 53 (*)    All other components within normal limits  ETHANOL  URINALYSIS, ROUTINE W REFLEX MICROSCOPIC  URINE RAPID DRUG SCREEN (HOSP PERFORMED)    Imaging Review No results found.   EKG Interpretation None      MDM   Final diagnoses:  Seizure    Recurrent falls with clinical hx c/w seizures. No significant injuries. Seizure would be a new DX. Possible relationship to recent infection with Ehrlichiosis. He will require admission, Neuro consult and likely an EEG. Antiepileptics not ordered, to facilitate EEG. So far, eiosodes have been self-limited.  Nursing Notes Reviewed/ Care Coordinated Applicable Imaging Reviewed Interpretation of Laboratory Data incorporated into ED treatment   Plan: Admit.  I personally performed the services described in this documentation, which was scribed in my presence. The recorded information has been reviewed and is  accurate.     Richarda Blade, MD 08/13/14 1100

## 2014-08-12 NOTE — ED Notes (Signed)
Patient's son-in-law called EMS and reports patient had seizure at approximately 1930. Seizure lasted for approximately 2 minutes and was described to be grand mal seizure. Patient has been alert and oriented and talkative since EMS arrival on scene. Patient denies complaints at this time.

## 2014-08-13 ENCOUNTER — Observation Stay (HOSPITAL_COMMUNITY)
Admit: 2014-08-13 | Discharge: 2014-08-13 | Disposition: A | Discharge status: Home / Self Care | Payer: Medicare Other | Attending: Internal Medicine | Admitting: Internal Medicine

## 2014-08-13 ENCOUNTER — Encounter (HOSPITAL_COMMUNITY): Payer: Self-pay | Admitting: Emergency Medicine

## 2014-08-13 ENCOUNTER — Inpatient Hospital Stay (HOSPITAL_COMMUNITY): Payer: Medicare Other

## 2014-08-13 DIAGNOSIS — R569 Unspecified convulsions: Secondary | ICD-10-CM

## 2014-08-13 DIAGNOSIS — N179 Acute kidney failure, unspecified: Secondary | ICD-10-CM | POA: Diagnosis present

## 2014-08-13 DIAGNOSIS — I2510 Atherosclerotic heart disease of native coronary artery without angina pectoris: Secondary | ICD-10-CM | POA: Diagnosis not present

## 2014-08-13 DIAGNOSIS — I10 Essential (primary) hypertension: Secondary | ICD-10-CM | POA: Diagnosis not present

## 2014-08-13 DIAGNOSIS — M069 Rheumatoid arthritis, unspecified: Secondary | ICD-10-CM | POA: Diagnosis not present

## 2014-08-13 DIAGNOSIS — D649 Anemia, unspecified: Secondary | ICD-10-CM | POA: Diagnosis present

## 2014-08-13 DIAGNOSIS — D61818 Other pancytopenia: Secondary | ICD-10-CM

## 2014-08-13 HISTORY — DX: Other pancytopenia: D61.818

## 2014-08-13 HISTORY — DX: Unspecified convulsions: R56.9

## 2014-08-13 LAB — GLUCOSE, CAPILLARY
GLUCOSE-CAPILLARY: 108 mg/dL — AB (ref 70–99)
Glucose-Capillary: 137 mg/dL — ABNORMAL HIGH (ref 70–99)
Glucose-Capillary: 115 mg/dL — ABNORMAL HIGH (ref 70–99)
Glucose-Capillary: 164 mg/dL — ABNORMAL HIGH (ref 70–99)
Glucose-Capillary: 198 mg/dL — ABNORMAL HIGH (ref 70–99)

## 2014-08-13 LAB — BASIC METABOLIC PANEL
ANION GAP: 9 (ref 5–15)
BUN: 16 mg/dL (ref 6–23)
CHLORIDE: 100 meq/L (ref 96–112)
CO2: 28 meq/L (ref 19–32)
Calcium: 8.5 mg/dL (ref 8.4–10.5)
Creatinine, Ser: 1.54 mg/dL — ABNORMAL HIGH (ref 0.50–1.35)
GFR calc Af Amer: 53 mL/min — ABNORMAL LOW (ref >90)
GFR calc non Af Amer: 46 mL/min — ABNORMAL LOW (ref >90)
GLUCOSE: 145 mg/dL — AB (ref 70–99)
POTASSIUM: 5 meq/L (ref 3.7–5.3)
SODIUM: 137 meq/L (ref 137–147)

## 2014-08-13 LAB — T4, FREE: FREE T4: 0.98 ng/dL (ref 0.80–1.80)

## 2014-08-13 LAB — RAPID URINE DRUG SCREEN, HOSP PERFORMED
Amphetamines: NOT DETECTED
Barbiturates: NOT DETECTED
Benzodiazepines: NOT DETECTED
COCAINE: NOT DETECTED
OPIATES: NOT DETECTED
Tetrahydrocannabinol: NOT DETECTED

## 2014-08-13 LAB — URINE MICROSCOPIC-ADD ON

## 2014-08-13 LAB — URINALYSIS, ROUTINE W REFLEX MICROSCOPIC
Bilirubin Urine: NEGATIVE
Glucose, UA: NEGATIVE mg/dL
Ketones, ur: NEGATIVE mg/dL
LEUKOCYTES UA: NEGATIVE
Nitrite: NEGATIVE
Protein, ur: NEGATIVE mg/dL
SPECIFIC GRAVITY, URINE: 1.02 (ref 1.005–1.030)
UROBILINOGEN UA: 0.2 mg/dL (ref 0.0–1.0)
pH: 7 (ref 5.0–8.0)

## 2014-08-13 LAB — FERRITIN: Ferritin: 67 ng/mL (ref 22–322)

## 2014-08-13 LAB — RETICULOCYTES
RBC.: 2.84 MIL/uL — AB (ref 4.22–5.81)
RETIC COUNT ABSOLUTE: 31.2 10*3/uL (ref 19.0–186.0)
RETIC CT PCT: 1.1 % (ref 0.4–3.1)

## 2014-08-13 LAB — FOLATE

## 2014-08-13 LAB — CBC
HEMATOCRIT: 24.5 % — AB (ref 39.0–52.0)
Hemoglobin: 7.9 g/dL — ABNORMAL LOW (ref 13.0–17.0)
MCH: 31 pg (ref 26.0–34.0)
MCHC: 32.2 g/dL (ref 30.0–36.0)
MCV: 96.1 fL (ref 78.0–100.0)
Platelets: 96 10*3/uL — ABNORMAL LOW (ref 150–400)
RBC: 2.55 MIL/uL — AB (ref 4.22–5.81)
RDW: 14.7 % (ref 11.5–15.5)
WBC: 3.7 10*3/uL — AB (ref 4.0–10.5)

## 2014-08-13 LAB — IRON AND TIBC
Iron: 48 ug/dL (ref 42–135)
SATURATION RATIOS: 18 % — AB (ref 20–55)
TIBC: 264 ug/dL (ref 215–435)
UIBC: 216 ug/dL (ref 125–400)

## 2014-08-13 LAB — VITAMIN B12: VITAMIN B 12: 752 pg/mL (ref 211–911)

## 2014-08-13 LAB — TSH: TSH: 2.05 u[IU]/mL (ref 0.350–4.500)

## 2014-08-13 MED ORDER — ENOXAPARIN SODIUM 40 MG/0.4ML ~~LOC~~ SOLN
40.0000 mg | SUBCUTANEOUS | Status: DC
  Administered 2014-08-13 – 2014-08-14 (×2): 40 mg via SUBCUTANEOUS
  Filled 2014-08-13 (×2): qty 0.4

## 2014-08-13 MED ORDER — ACETAMINOPHEN 325 MG PO TABS
650.0000 mg | ORAL_TABLET | Freq: Four times a day (QID) | ORAL | Status: DC | PRN
  Administered 2014-08-13: 650 mg via ORAL
  Filled 2014-08-13: qty 2

## 2014-08-13 MED ORDER — HYPROMELLOSE (GONIOSCOPIC) 2.5 % OP SOLN
1.0000 [drp] | Freq: Three times a day (TID) | OPHTHALMIC | Status: DC | PRN
  Filled 2014-08-13: qty 15

## 2014-08-13 MED ORDER — POLYVINYL ALCOHOL 1.4 % OP SOLN: 1.0000 [drp] | Freq: Three times a day (TID) | OPHTHALMIC | Status: DC | PRN

## 2014-08-13 MED ORDER — ASPIRIN 81 MG PO CHEW
81.0000 mg | CHEWABLE_TABLET | Freq: Every morning | ORAL | Status: DC
  Administered 2014-08-13 – 2014-08-14 (×2): 81 mg via ORAL
  Filled 2014-08-13 (×2): qty 1

## 2014-08-13 MED ORDER — AMITRIPTYLINE HCL 25 MG PO TABS
25.0000 mg | ORAL_TABLET | Freq: Every day | ORAL | Status: DC
  Administered 2014-08-13: 25 mg via ORAL
  Filled 2014-08-13: qty 1

## 2014-08-13 MED ORDER — ONDANSETRON HCL 4 MG/2ML IJ SOLN
4.0000 mg | Freq: Four times a day (QID) | INTRAMUSCULAR | Status: DC | PRN
  Administered 2014-08-13: 4 mg via INTRAVENOUS
  Filled 2014-08-13: qty 2

## 2014-08-13 MED ORDER — DOCUSATE SODIUM 100 MG PO CAPS: 100.0000 mg | ORAL_CAPSULE | Freq: Every day | ORAL | Status: DC | PRN

## 2014-08-13 MED ORDER — ALUM & MAG HYDROXIDE-SIMETH 200-200-20 MG/5ML PO SUSP: 30.0000 mL | Freq: Four times a day (QID) | ORAL | Status: DC | PRN

## 2014-08-13 MED ORDER — TAMSULOSIN HCL 0.4 MG PO CAPS
0.4000 mg | ORAL_CAPSULE | Freq: Every day | ORAL | Status: DC
  Administered 2014-08-13: 0.4 mg via ORAL
  Filled 2014-08-13: qty 1

## 2014-08-13 MED ORDER — ATENOLOL 25 MG PO TABS
25.0000 mg | ORAL_TABLET | Freq: Two times a day (BID) | ORAL | Status: DC
  Administered 2014-08-13 (×3): 25 mg via ORAL
  Filled 2014-08-13 (×4): qty 1

## 2014-08-13 MED ORDER — ATORVASTATIN CALCIUM 40 MG PO TABS
80.0000 mg | ORAL_TABLET | Freq: Every day | ORAL | Status: DC
Start: 2014-08-13 — End: 2014-08-14
  Administered 2014-08-13: 80 mg via ORAL
  Filled 2014-08-13: qty 2

## 2014-08-13 MED ORDER — INSULIN ASPART 100 UNIT/ML ~~LOC~~ SOLN: 0.0000 [IU] | Freq: Every day | SUBCUTANEOUS | Status: DC

## 2014-08-13 MED ORDER — METHADONE HCL 10 MG PO TABS
10.0000 mg | ORAL_TABLET | Freq: Four times a day (QID) | ORAL | Status: DC
  Administered 2014-08-13 – 2014-08-14 (×6): 10 mg via ORAL
  Filled 2014-08-13 (×6): qty 1

## 2014-08-13 MED ORDER — SENNOSIDES-DOCUSATE SODIUM 8.6-50 MG PO TABS
1.0000 | ORAL_TABLET | Freq: Every day | ORAL | Status: DC
  Administered 2014-08-13: 1 via ORAL
  Filled 2014-08-13: qty 1

## 2014-08-13 MED ORDER — SODIUM CHLORIDE 0.9 % IV SOLN
INTRAVENOUS | Status: AC
  Filled 2014-08-13: qty 50

## 2014-08-13 MED ORDER — ACETAMINOPHEN 650 MG RE SUPP: 650.0000 mg | Freq: Four times a day (QID) | RECTAL | Status: DC | PRN

## 2014-08-13 MED ORDER — POLYETHYLENE GLYCOL 3350 17 G PO PACK
17.0000 g | PACK | Freq: Every day | ORAL | Status: DC
  Administered 2014-08-13 – 2014-08-14 (×2): 17 g via ORAL
  Filled 2014-08-13 (×2): qty 1

## 2014-08-13 MED ORDER — POLYETHYLENE GLYCOL 3350 17 G PO PACK
17.0000 g | PACK | Freq: Every day | ORAL | Status: DC | PRN
Start: 2014-08-13 — End: 2014-08-14
  Administered 2014-08-13: 17 g via ORAL
  Filled 2014-08-13: qty 1

## 2014-08-13 MED ORDER — HYDROXYCHLOROQUINE SULFATE 200 MG PO TABS
200.0000 mg | ORAL_TABLET | Freq: Two times a day (BID) | ORAL | Status: DC
  Administered 2014-08-13 – 2014-08-14 (×2): 200 mg via ORAL
  Filled 2014-08-13 (×6): qty 1

## 2014-08-13 MED ORDER — AMITRIPTYLINE HCL 25 MG PO TABS: 75.0000 mg | ORAL_TABLET | Freq: Every day | ORAL | Status: DC

## 2014-08-13 MED ORDER — PREDNISONE 10 MG PO TABS
5.0000 mg | ORAL_TABLET | Freq: Every morning | ORAL | Status: DC
  Administered 2014-08-13 – 2014-08-14 (×2): 5 mg via ORAL
  Filled 2014-08-13 (×2): qty 1

## 2014-08-13 MED ORDER — SODIUM CHLORIDE 0.9 % IJ SOLN
INTRAMUSCULAR | Status: AC
  Administered 2014-08-13: 3 mL
  Filled 2014-08-13: qty 9

## 2014-08-13 MED ORDER — INSULIN ASPART PROT & ASPART (70-30 MIX) 100 UNIT/ML ~~LOC~~ SUSP
10.0000 [IU] | Freq: Every day | SUBCUTANEOUS | Status: DC
  Administered 2014-08-13: 10 [IU] via SUBCUTANEOUS
  Filled 2014-08-13: qty 10

## 2014-08-13 MED ORDER — GABAPENTIN 300 MG PO CAPS
300.0000 mg | ORAL_CAPSULE | Freq: Three times a day (TID) | ORAL | Status: DC
  Administered 2014-08-13 – 2014-08-14 (×4): 300 mg via ORAL
  Filled 2014-08-13 (×4): qty 1

## 2014-08-13 MED ORDER — INSULIN ASPART PROT & ASPART (70-30 MIX) 100 UNIT/ML ~~LOC~~ SUSP: 10.0000 [IU] | Freq: Two times a day (BID) | SUBCUTANEOUS | Status: DC

## 2014-08-13 MED ORDER — METHADONE HCL 10 MG PO TABS
5.0000 mg | ORAL_TABLET | Freq: Three times a day (TID) | ORAL | Status: DC | PRN
  Administered 2014-08-14: 5 mg via ORAL

## 2014-08-13 MED ORDER — INSULIN ASPART PROT & ASPART (70-30 MIX) 100 UNIT/ML ~~LOC~~ SUSP: 12.0000 [IU] | Freq: Every day | SUBCUTANEOUS | Status: DC

## 2014-08-13 MED ORDER — INSULIN ASPART 100 UNIT/ML ~~LOC~~ SOLN
0.0000 [IU] | Freq: Three times a day (TID) | SUBCUTANEOUS | Status: DC
Start: 2014-08-13 — End: 2014-08-14
  Administered 2014-08-13: 2 [IU] via SUBCUTANEOUS
  Administered 2014-08-14: 3 [IU] via SUBCUTANEOUS

## 2014-08-13 MED ORDER — SODIUM CHLORIDE 0.9 % IV SOLN
INTRAVENOUS | Status: DC
  Administered 2014-08-13: 04:00:00 via INTRAVENOUS

## 2014-08-13 MED ORDER — POLYETHYLENE GLYCOL 3350 17 GM/SCOOP PO POWD
17.0000 g | Freq: Every day | ORAL | Status: DC
  Filled 2014-08-13: qty 255

## 2014-08-13 NOTE — Care Management Note (Addendum)
    Page 1 of 1   08/14/2014     1:31:38 PM CARE MANAGEMENT NOTE 08/14/2014  Patient:  [name], [name]   Account Number:  [id number]  Date Initiated:  08/13/2014  Documentation initiated by:  Theophilus Kinds  Subjective/Objective Assessment:   Pt admitted from home with seizures. Pt lives with his wife and will return home at discharge. Pt is fairly independent with ADL's. Pt does have a cane for home use.     Action/Plan:   PT consult. Will continue to follow for discharge planning needs.   Anticipated DC Date:  08/14/2014   Anticipated DC Plan:  [place]  CM consult      Choice offered to / List presented to:             Status of service:  Completed, signed off Medicare Important Message given?   (If response is "NO", the following Medicare IM given date fields will be blank) Date Medicare IM given:   Medicare IM given by:   Date Additional Medicare IM given:   Additional Medicare IM given by:    Discharge Disposition:  HOME/SELF CARE  Per UR Regulation:    If discussed at Long Length of Stay Meetings, dates discussed:    Comments:  08/14/14 [place], RN BSN CM PT recommends [facility] PT. Pt stated that he is very active at home and does not feel he needs [facility] at this time. Pt was instructed that if he changes his mind to contact his PCP who can arrange it. No furthur CM or discharge needs noted. Pt potential discharge home today.  08/13/14 [place], RN BSN CM

## 2014-08-13 NOTE — ED Notes (Signed)
Fully alert, has been able to stand to urinate w/o difficulty. No seizure activity noted while in ED.

## 2014-08-13 NOTE — Consult Note (Signed)
[place] A. Merlene Laughter, MD     [email or web address]          [name] is an 63 y.o. male.   ASSESSMENT/PLAN: 1. Recurrent episodes of staring, unresponsiveness and amnesia. The spells seem to occur both while standing and in the recumbent position. The association with the amnesia points of possibility of complex partial seizures. However, most of these events occurs while the patient in standing and are associated with falling Which may indicate some type of gait disorder. The patient certainly has multiple disorder at baseline and that can cause gait impairment including diabetic polyneuropathy, severe rheumatoid arthritis and medication effect. The patient recently was on statin medication in the combination with antibiotics which is known to cause increased risk of statin myopathy. CPK and other sites will be ordered. The patient continues to have recurrent symptoms, we may treat him empirically with a antiseizure medication such as Keppra or Lamictal.  Patient is 63 year old white male who was admitted to this hospital about 2 months ago for altered consciousness and myoclonic jerks. Patient was in a rather severe stuporous condition. He was transferred to [facility] where he had an extensive evaluation. 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 Since then, the patient has had recurrent episodes of staring, unresponsiveness and falling. Most of these events occurs while he is standing but he has had several events in the recumbent position. The patient tells me that he can feel when the spells are coming on and attempts to sit most times. It appears he also has spells where in he is seated or lying down. He is amnestic to these events. The wife however reports that he has had several of these events while in the recumbent position. Apart from the antibiotics he got 2 months ago, he has not been on other  medications. He is on multiple psychotropic medications for pain but reports that these have not changed in many years. He was on another medication for rheumatoid arthritis but this was discontinued. He does have chronic pain problems. He denies focal weakness, headaches, visual symptoms or dysarthria. He does report having dizziness with these spells described as a lightheaded sensation before he falls. Does report having abdominal fullness Recently on the right side. He does have baseline history of umbilical hernia. No GI symptoms are present with her. The review of systems otherwise negative.  GENERAL: Pleasant somewhat chronically ill-appearing man in no acute distress.  HEENT: Supple. Atraumatic normocephalic. Reduced range of motion.  ABDOMEN: soft  EXTREMITIES: No edema. Marked ulnar deviation and arthritic changes of the hands, knees and joints in general. He has reduced range of motion and use of the hands due to severe rheumatoid arthritic changes. This is associated with some weakness of the hand muscles.  BACK: Normal.  SKIN: Normal by inspection.    MENTAL STATUS: Alert and oriented. Speech, language and cognition are generally intact. Judgment and insight normal.   CRANIAL NERVES: Pupils are equal, round and reactive to light and accommodation; extra ocular movements are full, there is no significant nystagmus; visual fields are full; upper and lower facial muscles are normal in strength and symmetric, there is no flattening of the nasolabial folds; tongue is midline; uvula is midline; shoulder elevation is normal.  MOTOR: Weakness of hand muscles bilaterally without atrophy. Otherwise, Normal tone, bulk and strength; no pronator drift.  COORDINATION: Left finger to nose is normal, right finger to nose  is normal, No rest tremor; no intention tremor; no postural tremor; no bradykinesia.  REFLEXES: Deep tendon reflexes are symmetrical and normal- But absent at the knees and  profoundly reduced at the ankles. Babinski reflexes are flexor bilaterally.   SENSATION: Normal to light touch.     Past Medical History  Diagnosis Date  . Rheumatoid arthritis(714.0)   . Coronary artery disease 1993    angioplasty after AMI  . Diabetes mellitus   . Hypertension   . Diabetic neuropathy     Past Surgical History  Procedure Laterality Date  . Joint replacement    . Fracture surgery    . Cardiac surgery      Family History  Problem Relation Age of Onset  . Diabetes Father   . Dementia Father   . Heart attack Brother     multiple brothers with MIs  . Hypertension Mother   . Stroke Mother     Social History:  reports that he has never smoked. He does not have any smokeless tobacco history on file. He reports that he does not drink alcohol or use illicit drugs.  Allergies:  Allergies  Allergen Reactions  . Sulfa Antibiotics Rash    Medications: Prior to Admission medications   Medication Sig Start Date End Date Taking? Authorizing Provider  amitriptyline (ELAVIL) 50 MG tablet Take 75 mg by mouth at bedtime.    Yes Historical Provider, MD  aspirin 81 MG chewable tablet Chew 81 mg by mouth every morning.    Yes Historical Provider, MD  atenolol (TENORMIN) 25 MG tablet Take 25 mg by mouth 2 (two) times daily.   Yes Historical Provider, MD  atorvastatin (LIPITOR) 80 MG tablet Take 80 mg by mouth at bedtime.    Yes Historical Provider, MD  gabapentin (NEURONTIN) 300 MG capsule Take 300 mg by mouth 3 (three) times daily.   Yes Historical Provider, MD  hydroxychloroquine (PLAQUENIL) 200 MG tablet Take 200 mg by mouth 2 (two) times daily.   Yes Historical Provider, MD  hydroxypropyl methylcellulose (ISOPTO TEARS) 2.5 % ophthalmic solution Place 1 drop into both eyes daily as needed. For dry eye relief   Yes Historical Provider, MD  insulin NPH-regular Human (NOVOLIN 70/30) (70-30) 100 UNIT/ML injection Inject 10-12 Units into the skin 2 (two) times daily. 10  units in the morning and 12 units at bedtime   Yes Historical Provider, MD  ketoconazole (NIZORAL) 2 % cream Apply 1 application topically 2 (two) times daily. 03/07/13  Yes Historical Provider, MD  methadone (DOLOPHINE) 10 MG tablet Take 10 mg by mouth 4 (four) times daily.   Yes Historical Provider, MD  methadone (DOLOPHINE) 5 MG tablet Take 5 mg by mouth as needed. For breakthrough pain   Yes Historical Provider, MD  polyethylene glycol powder (MIRALAX) powder Take 17 g by mouth daily. 12/20/12  Yes Historical Provider, MD  predniSONE (DELTASONE) 5 MG tablet Take 5 mg by mouth every morning.    Yes Historical Provider, MD  Tamsulosin HCl (FLOMAX) 0.4 MG CAPS Take 0.4 mg by mouth at bedtime.    Yes Historical Provider, MD    Scheduled Meds: . amitriptyline  75 mg Oral QHS  . aspirin  81 mg Oral q morning - 10a  . atenolol  25 mg Oral BID  . atorvastatin  80 mg Oral QHS  . enoxaparin (LOVENOX) injection  40 mg Subcutaneous Q24H  . gabapentin  300 mg Oral TID  . hydroxychloroquine  200 mg Oral BID  .  insulin aspart  0-5 Units Subcutaneous QHS  . insulin aspart  0-9 Units Subcutaneous TID WC  . methadone  10 mg Oral QID  . polyethylene glycol  17 g Oral Daily  . predniSONE  5 mg Oral q morning - 10a  . senna-docusate  1 tablet Oral QHS  . tamsulosin  0.4 mg Oral QHS   Continuous Infusions: . sodium chloride     PRN Meds:.acetaminophen, acetaminophen, alum & mag hydroxide-simeth, docusate sodium, methadone, polyvinyl alcohol   Blood pressure 135/71, pulse 50, temperature 97.5 F (36.4 C), temperature source Oral, resp. rate 20, height '5\' 10"'  (1.778 m), weight 79.379 kg (175 lb), SpO2 92.00%.   Results for orders placed during the hospital encounter of 08/12/14 (from the past 48 hour(s))  CBC WITH DIFFERENTIAL     Status: Abnormal   Collection Time    08/12/14  9:10 PM      Result Value Ref Range   WBC 4.4  4.0 - 10.5 K/uL   RBC 2.65 (*) 4.22 - 5.81 MIL/uL   Hemoglobin 8.2 (*)  13.0 - 17.0 g/dL   HCT 25.5 (*) 39.0 - 52.0 %   MCV 96.2  78.0 - 100.0 fL   MCH 30.9  26.0 - 34.0 pg   MCHC 32.2  30.0 - 36.0 g/dL   RDW 14.6  11.5 - 15.5 %   Platelets 98 (*) 150 - 400 K/uL   Comment: RESULT REPEATED AND VERIFIED     SPECIMEN CHECKED FOR CLOTS   Neutrophils Relative % 71  43 - 77 %   Lymphocytes Relative 21  12 - 46 %   Monocytes Relative 6  3 - 12 %   Eosinophils Relative 1  0 - 5 %   Basophils Relative 1  0 - 1 %   Neutro Abs 3.2  1.7 - 7.7 K/uL   Lymphs Abs 0.9  0.7 - 4.0 K/uL   Monocytes Absolute 0.3  0.1 - 1.0 K/uL   Eosinophils Absolute 0.0  0.0 - 0.7 K/uL   Basophils Absolute 0.0  0.0 - 0.1 K/uL   RBC Morphology ELLIPTOCYTES     Comment: STOMATOCYTES     SPHEROCYTES   WBC Morphology TOXIC GRANULATION     Comment: ATYPICAL LYMPHOCYTES   Smear Review PLATELET COUNT CONFIRMED BY SMEAR    BASIC METABOLIC PANEL     Status: Abnormal   Collection Time    08/12/14  9:10 PM      Result Value Ref Range   Sodium 136 (*) 137 - 147 mEq/L   Potassium 5.1  3.7 - 5.3 mEq/L   Chloride 99  96 - 112 mEq/L   CO2 28  19 - 32 mEq/L   Glucose, Bld 161 (*) 70 - 99 mg/dL   BUN 16  6 - 23 mg/dL   Creatinine, Ser 1.55 (*) 0.50 - 1.35 mg/dL   Calcium 8.6  8.4 - 10.5 mg/dL   GFR calc non Af Amer 46 (*) >90 mL/min   GFR calc Af Amer 53 (*) >90 mL/min   Comment: (NOTE)     The eGFR has been calculated using the CKD EPI equation.     This calculation has not been validated in all clinical situations.     eGFR's persistently <90 mL/min signify possible Chronic Kidney     Disease.   Anion gap 9  5 - 15  ETHANOL     Status: None   Collection Time    08/12/14  9:10  PM      Result Value Ref Range   Alcohol, Ethyl (B) <11  0 - 11 mg/dL   Comment:            LOWEST DETECTABLE LIMIT FOR     SERUM ALCOHOL IS 11 mg/dL     FOR MEDICAL PURPOSES ONLY  URINALYSIS, ROUTINE W REFLEX MICROSCOPIC     Status: Abnormal   Collection Time    08/12/14 11:58 PM      Result Value Ref Range     Color, Urine YELLOW  YELLOW   APPearance CLEAR  CLEAR   Specific Gravity, Urine 1.020  1.005 - 1.030   pH 7.0  5.0 - 8.0   Glucose, UA NEGATIVE  NEGATIVE mg/dL   Hgb urine dipstick MODERATE (*) NEGATIVE   Bilirubin Urine NEGATIVE  NEGATIVE   Ketones, ur NEGATIVE  NEGATIVE mg/dL   Protein, ur NEGATIVE  NEGATIVE mg/dL   Urobilinogen, UA 0.2  0.0 - 1.0 mg/dL   Nitrite NEGATIVE  NEGATIVE   Leukocytes, UA NEGATIVE  NEGATIVE  URINE RAPID DRUG SCREEN (HOSP PERFORMED)     Status: None   Collection Time    08/12/14 11:58 PM      Result Value Ref Range   Opiates NONE DETECTED  NONE DETECTED   Cocaine NONE DETECTED  NONE DETECTED   Benzodiazepines NONE DETECTED  NONE DETECTED   Amphetamines NONE DETECTED  NONE DETECTED   Tetrahydrocannabinol NONE DETECTED  NONE DETECTED   Barbiturates NONE DETECTED  NONE DETECTED   Comment:            DRUG SCREEN FOR MEDICAL PURPOSES     ONLY.  IF CONFIRMATION IS NEEDED     FOR ANY PURPOSE, NOTIFY LAB     WITHIN 5 DAYS.                LOWEST DETECTABLE LIMITS     FOR URINE DRUG SCREEN     Drug Class       Cutoff (ng/mL)     Amphetamine      1000     Barbiturate      200     Benzodiazepine   937     Tricyclics       902     Opiates          300     Cocaine          300     THC              50  URINE MICROSCOPIC-ADD ON     Status: Abnormal   Collection Time    08/12/14 11:58 PM      Result Value Ref Range   Squamous Epithelial / LPF RARE  RARE   WBC, UA 0-2  <3 WBC/hpf   RBC / HPF 11-20  <3 RBC/hpf   Bacteria, UA FEW (*) RARE  GLUCOSE, CAPILLARY     Status: Abnormal   Collection Time    08/13/14  3:38 AM      Result Value Ref Range   Glucose-Capillary 137 (*) 70 - 99 mg/dL   Comment 1 Notify RN    BASIC METABOLIC PANEL     Status: Abnormal   Collection Time    08/13/14  5:32 AM      Result Value Ref Range   Sodium 137  137 - 147 mEq/L   Potassium 5.0  3.7 - 5.3 mEq/L   Chloride 100  96 -  112 mEq/L   CO2 28  19 - 32 mEq/L   Glucose,  Bld 145 (*) 70 - 99 mg/dL   BUN 16  6 - 23 mg/dL   Creatinine, Ser 1.54 (*) 0.50 - 1.35 mg/dL   Calcium 8.5  8.4 - 10.5 mg/dL   GFR calc non Af Amer 46 (*) >90 mL/min   GFR calc Af Amer 53 (*) >90 mL/min   Comment: (NOTE)     The eGFR has been calculated using the CKD EPI equation.     This calculation has not been validated in all clinical situations.     eGFR's persistently <90 mL/min signify possible Chronic Kidney     Disease.   Anion gap 9  5 - 15  CBC     Status: Abnormal   Collection Time    08/13/14  5:32 AM      Result Value Ref Range   WBC 3.7 (*) 4.0 - 10.5 K/uL   RBC 2.55 (*) 4.22 - 5.81 MIL/uL   Hemoglobin 7.9 (*) 13.0 - 17.0 g/dL   HCT 24.5 (*) 39.0 - 52.0 %   MCV 96.1  78.0 - 100.0 fL   MCH 31.0  26.0 - 34.0 pg   MCHC 32.2  30.0 - 36.0 g/dL   RDW 14.7  11.5 - 15.5 %   Platelets 96 (*) 150 - 400 K/uL   Comment: SPECIMEN CHECKED FOR CLOTS     CONSISTENT WITH PREVIOUS RESULT  TSH     Status: None   Collection Time    08/13/14  5:32 AM      Result Value Ref Range   TSH 2.050  0.350 - 4.500 uIU/mL   Comment: Performed at [place], CAPILL[facility]     Status: Abnormal   Collection Time    08/13/14  7:47 AM      Result Value Ref Range   Glucose-Capillary 108 (*) 70 - 99 mg/dL   Comment 1 Notify RN    GLUCOSE, CAPILLARY     Status: Abnormal   Collection Time    08/13/14 11:23 AM      Result Value Ref Range   Glucose-Capillary 115 (*) 70 - 99 mg/dL   Comment 1 Notify RN    VITAMIN B12     Status: None   Collection Time    08/13/14 11:44 AM      Result Value Ref Range   Vitamin B-12 752  211 - 911 pg/mL   Comment: Performed at [place]     Status: None   Collection Time    08/13/14 11:44 AM      Result Value Ref Range   Folate >20.0     Comment: (NOTE)     Reference Ranges            Deficient:       0.4 - 3.3 ng/mL            Indeterminate:   3.4 - 5.4 ng/mL            Normal:              > 5.4 ng/mL     Performed  at [place]     Status: None   Collection Time    08/13/14 11:44 AM      Result Value Ref Range   Ferritin 67  22 - 322 ng/mL   Comment: Performed at [facility]  RETICULOCYTES     Status: Abnormal   Collection Time    08/13/14 11:44 AM      Result Value Ref Range   Retic Ct Pct 1.1  0.4 - 3.1 %   RBC. 2.84 (*) 4.22 - 5.81 MIL/uL   Retic Count, Manual 31.2  19.0 - 186.0 K/uL  GLUCOSE, CAPILLARY     Status: Abnormal   Collection Time    08/13/14  4:32 PM      Result Value Ref Range   Glucose-Capillary 164 (*) 70 - 99 mg/dL   Comment 1 Notify RN     Comment 2 Documented in Chart      Mr Brain Wo Contrast  08/13/2014   CLINICAL DATA:  63 year old male with syncope. Possible seizure activity. Initial encounter.  EXAM: MRI HEAD WITHOUT CONTRAST  TECHNIQUE: Multiplanar, multiecho pulse sequences of the brain and surrounding structures were obtained without intravenous contrast.  COMPARISON:  Head CT without contrast 06/18/2014.  FINDINGS: Cerebral volume is within normal limits for age. No restricted diffusion to suggest acute infarction. No midline shift, mass effect, evidence of mass lesion, ventriculomegaly, extra-axial collection or acute intracranial hemorrhage. Cervicomedullary junction and pituitary are within normal limits. Negative visualized cervical spine. Major intracranial vascular flow voids are preserved.  Scattered punctate and patchy cerebral white matter T2 and FLAIR hyperintensity, nonspecific and mild to moderate for age. No cortical encephalomalacia identified. Mesial temporal lobe structures including the hippocampi appear within normal limits. Deep gray matter nuclei, brainstem and cerebellum are within normal limits. Visible internal auditory structures appear normal.  Visualized paranasal sinuses and mastoids are clear. Visualized orbit soft tissues are within normal limits. Visualized scalp soft tissues are within normal limits. Normal bone  marrow signal.  IMPRESSION: No acute intracranial abnormality. Mild to moderate for age nonspecific white matter signal changes, most commonly due to chronic small vessel disease.   Electronically Signed   By: Lars Pinks M.D.   On: 08/13/2014 13:05        Skylie Hiott A. Merlene Laughter, M.D.  Diplomate, Tax adviser of Psychiatry and Neurology ( Neurology). 08/13/2014, 6:30 PM

## 2014-08-13 NOTE — Progress Notes (Signed)
The patient is a 63 year old man with rheumatoid arthritis, diabetes mellitus, and coronary artery disease, who was admitted this morning with a report of staring spells, query possible seizures. He was briefly seen and examined. His chart, vital signs, laboratory studies were reviewed. The patient is currently alert and oriented. Neurology consult is pending. MRI of the brain has been ordered and is pending. We'll order an EEG. Because of his low-normal blood sugars, we'll discontinue 70/30 insulin and start sliding scale insulin-sensitive scale. We'll also order orthostatic vital signs. For his pancytopenia, we'll order an anemia panel, TSH and free T4. Will continue gentle IV fluids. We'll order a CBC with a differential tomorrow morning.

## 2014-08-13 NOTE — Progress Notes (Signed)
EEG Completed; Results Pending  

## 2014-08-13 NOTE — H&P (Signed)
History and Physical  [name] [id number] DOB: November 16, 1951 DOA: 08/12/2014  Referring physician: Dr. Eulis Foster PCP: Lala Lund, MD   Chief Complaint: Staring spells  HPI: [name] is a 63 y.o. male with a history of hypertension, diabetes type 2, peripheral neuropathy, coronary artery disease, rheumatoid arthritis and recent hospitalization and treatment for erhlichiosis. Since that time he has had jerking spells where he will have a view myoclonic jerks that precedes a staring spell. A staring spell last for a minute or 2 and then resolve. During that time, the patient is unaware of surroundings.  There is no post episodal confusion. These happen at random intervals, but not daily. During the episodes, his legs will buckle if he is standing and he has fallen.   Review of Systems:   Pt denies any fevers, chills, nausea, vomiting, chest pain, shortness of breath, facial droop, weakness.  Review of systems are otherwise negative  Past Medical History  Diagnosis Date  . Rheumatoid arthritis(714.0)   . Coronary artery disease 1993    angioplasty after AMI  . Diabetes mellitus   . Hypertension   . Diabetic neuropathy    Past Surgical History  Procedure Laterality Date  . Joint replacement    . Fracture surgery    . Cardiac surgery     Social History:  reports that he has never smoked. He does not have any smokeless tobacco history on file. He reports that he does not drink alcohol or use illicit drugs. Patient lives at home & is able to participate in activities of daily living without assistance  Allergies  Allergen Reactions  . Sulfa Antibiotics Rash    Family History  Problem Relation Age of Onset  . Diabetes Father   . Dementia Father   . Heart attack Brother     multiple brothers with MIs  . Hypertension Mother   . Stroke Mother       Prior to Admission medications   Medication Sig Start Date End Date Taking? Authorizing Provider  amitriptyline (ELAVIL)  50 MG tablet Take 75 mg by mouth at bedtime.    Yes Historical Provider, MD  aspirin 81 MG chewable tablet Chew 81 mg by mouth every morning.    Yes Historical Provider, MD  atenolol (TENORMIN) 25 MG tablet Take 25 mg by mouth 2 (two) times daily.   Yes Historical Provider, MD  atorvastatin (LIPITOR) 80 MG tablet Take 80 mg by mouth at bedtime.    Yes Historical Provider, MD  gabapentin (NEURONTIN) 300 MG capsule Take 300 mg by mouth 3 (three) times daily.   Yes Historical Provider, MD  hydroxychloroquine (PLAQUENIL) 200 MG tablet Take 200 mg by mouth 2 (two) times daily.   Yes Historical Provider, MD  hydroxypropyl methylcellulose (ISOPTO TEARS) 2.5 % ophthalmic solution Place 1 drop into both eyes daily as needed. For dry eye relief   Yes Historical Provider, MD  insulin NPH-regular Human (NOVOLIN 70/30) (70-30) 100 UNIT/ML injection Inject 10-12 Units into the skin 2 (two) times daily. 10 units in the morning and 12 units at bedtime   Yes Historical Provider, MD  ketoconazole (NIZORAL) 2 % cream Apply 1 application topically 2 (two) times daily. 03/07/13  Yes Historical Provider, MD  methadone (DOLOPHINE) 10 MG tablet Take 10 mg by mouth 4 (four) times daily.   Yes Historical Provider, MD  methadone (DOLOPHINE) 5 MG tablet Take 5 mg by mouth as needed. For breakthrough pain   Yes Historical Provider, MD  polyethylene glycol powder (MIRALAX) powder Take 17 g by mouth daily. 12/20/12  Yes Historical Provider, MD  predniSONE (DELTASONE) 5 MG tablet Take 5 mg by mouth every morning.    Yes Historical Provider, MD  Tamsulosin HCl (FLOMAX) 0.4 MG CAPS Take 0.4 mg by mouth at bedtime.    Yes Historical Provider, MD    Physical Exam: BP 132/71  Pulse 54  Temp(Src) 98 F (36.7 C) (Oral)  Resp 20  Ht 5\' 10"  (1.778 m)  Wt 79.379 kg (175 lb)  BMI 25.11 kg/m2  SpO2 97%  General:  In general this is an older Caucasian male who is awake alert and oriented x3 and in no acute distress. Eyes: Pupils  equal, round, reactive to light. Extraocular muscles are intact. Sclerae anicteric. ENT: External auditory canals are patent and tympanic membranes reflect a good cone of light. Oropharynx is without mucosal lesions. Neck: Supple without lymphadenopathy or masses palpated. No carotid bruit. Cardiovascular: Regular rate with normal S1-S2 sounds. No murmurs auscultated. No jugular venous distention. Respiratory: Clear to auscultation bilaterally with no wheezes rales or rhonchi. Good respiratory effort. Abdomen: Soft, nontender, nondistended. Appropriate bowel sounds. No masses or hepatomegaly palpated Skin: Warm to touch no bruising. Musculoskeletal: Gross ulnar deviation of the fingers. There are 4 digits missing from his right foot. Psychiatric: Appropriate judgment and insight. Neurologic: Cranial nerves II through XII are intact. There is no tremor noted. There is no distal weakness.           Labs on Admission:  Basic Metabolic Panel:  Recent Labs Lab 08/12/14 2110  NA 136*  K 5.1  CL 99  CO2 28  GLUCOSE 161*  BUN 16  CREATININE 1.55*  CALCIUM 8.6   Liver Function Tests: No results found for this basename: AST, ALT, ALKPHOS, BILITOT, PROT, ALBUMIN,  in the last 168 hours No results found for this basename: LIPASE, AMYLASE,  in the last 168 hours No results found for this basename: AMMONIA,  in the last 168 hours CBC:  Recent Labs Lab 08/12/14 2110  WBC 4.4  NEUTROABS 3.2  HGB 8.2*  HCT 25.5*  MCV 96.2  PLT 98*   Cardiac Enzymes: No results found for this basename: CKTOTAL, CKMB, CKMBINDEX, TROPONINI,  in the last 168 hours  BNP (last 3 results) No results found for this basename: PROBNP,  in the last 8760 hours CBG: No results found for this basename: GLUCAP,  in the last 168 hours  Radiological Exams on Admission: No results found.   Assessment/Plan Present on Admission:  . 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 will check blood sugars with meals  #3 hypertension Continue antihypertensives  #4 chronic pain Continue methadone  Consultants: Neurology the morning  Code Status: Full code  Family Communication: Wife   Disposition Plan: Home after stabilization  Time spent: 50 minutes  [place], [place] [facility] Pager [phone]  **Disclaimer: This note may have been dictated with voice recognition software. Similar sounding words can inadvertently be transcribed and this note may contain transcription errors which may not have been corrected upon publication of note.**

## 2014-08-13 NOTE — Progress Notes (Signed)
UR completed 

## 2014-08-13 NOTE — Procedures (Signed)
[name] A. Merlene Laughter, MD     [email or web address]           HISTORY: The patient is a 63 year old who presents with confusion, myoclonic activity and and other abnormal movements activities suspicious for seizures.  MEDICATIONS: Scheduled Meds: . amitriptyline  75 mg Oral QHS  . aspirin  81 mg Oral q morning - 10a  . atenolol  25 mg Oral BID  . atorvastatin  80 mg Oral QHS  . enoxaparin (LOVENOX) injection  40 mg Subcutaneous Q24H  . gabapentin  300 mg Oral TID  . hydroxychloroquine  200 mg Oral BID  . insulin aspart  0-5 Units Subcutaneous QHS  . insulin aspart  0-9 Units Subcutaneous TID WC  . methadone  10 mg Oral QID  . polyethylene glycol  17 g Oral Daily  . predniSONE  5 mg Oral q morning - 10a  . senna-docusate  1 tablet Oral QHS  . tamsulosin  0.4 mg Oral QHS   Continuous Infusions: . sodium chloride     PRN Meds:.acetaminophen, acetaminophen, alum & mag hydroxide-simeth, docusate sodium, methadone, polyvinyl alcohol  Prior to Admission medications   Medication Sig Start Date End Date Taking? Authorizing Provider  amitriptyline (ELAVIL) 50 MG tablet Take 75 mg by mouth at bedtime.    Yes Historical Provider, MD  aspirin 81 MG chewable tablet Chew 81 mg by mouth every morning.    Yes Historical Provider, MD  atenolol (TENORMIN) 25 MG tablet Take 25 mg by mouth 2 (two) times daily.   Yes Historical Provider, MD  atorvastatin (LIPITOR) 80 MG tablet Take 80 mg by mouth at bedtime.    Yes Historical Provider, MD  gabapentin (NEURONTIN) 300 MG capsule Take 300 mg by mouth 3 (three) times daily.   Yes Historical Provider, MD  hydroxychloroquine (PLAQUENIL) 200 MG tablet Take 200 mg by mouth 2 (two) times daily.   Yes Historical Provider, MD  hydroxypropyl methylcellulose (ISOPTO TEARS) 2.5 % ophthalmic solution Place 1 drop into both eyes daily as needed. For dry eye relief   Yes Historical Provider, MD  insulin NPH-regular Human (NOVOLIN 70/30) (70-30)  100 UNIT/ML injection Inject 10-12 Units into the skin 2 (two) times daily. 10 units in the morning and 12 units at bedtime   Yes Historical Provider, MD  ketoconazole (NIZORAL) 2 % cream Apply 1 application topically 2 (two) times daily. 03/07/13  Yes Historical Provider, MD  methadone (DOLOPHINE) 10 MG tablet Take 10 mg by mouth 4 (four) times daily.   Yes Historical Provider, MD  methadone (DOLOPHINE) 5 MG tablet Take 5 mg by mouth as needed. For breakthrough pain   Yes Historical Provider, MD  polyethylene glycol powder (MIRALAX) powder Take 17 g by mouth daily. 12/20/12  Yes Historical Provider, MD  predniSONE (DELTASONE) 5 MG tablet Take 5 mg by mouth every morning.    Yes Historical Provider, MD  Tamsulosin HCl (FLOMAX) 0.4 MG CAPS Take 0.4 mg by mouth at bedtime.    Yes Historical Provider, MD      ANALYSIS: A 16 channel recording using standard 10 20 measurements is conducted for Approximately 20 minutes. There is a well-formed posterior dominant of 9 Hz which attenuates with eye opening. Awake and drowsy activities are observed. There is beta activity observed in the frontal areas. Photic simulation and hyperventilation were not carried out. There are no focal slowing. There are no lateralized slowing. There are no epileptiform activities observed.   IMPRESSION: 1. This recording of the  awake and drowsy states.       Kadelyn Dimascio A. Merlene [name], M.D.  Diplomate, Tax adviser of Psychiatry and Neurology ( Neurology).

## 2014-08-14 ENCOUNTER — Encounter (HOSPITAL_COMMUNITY): Payer: Self-pay | Admitting: Internal Medicine

## 2014-08-14 DIAGNOSIS — R269 Unspecified abnormalities of gait and mobility: Secondary | ICD-10-CM | POA: Diagnosis present

## 2014-08-14 DIAGNOSIS — R9431 Abnormal electrocardiogram [ECG] [EKG]: Secondary | ICD-10-CM

## 2014-08-14 DIAGNOSIS — N183 Chronic kidney disease, stage 3 unspecified: Secondary | ICD-10-CM | POA: Diagnosis present

## 2014-08-14 DIAGNOSIS — N179 Acute kidney failure, unspecified: Secondary | ICD-10-CM

## 2014-08-14 DIAGNOSIS — I498 Other specified cardiac arrhythmias: Secondary | ICD-10-CM

## 2014-08-14 DIAGNOSIS — R001 Bradycardia, unspecified: Secondary | ICD-10-CM | POA: Diagnosis not present

## 2014-08-14 DIAGNOSIS — E1149 Type 2 diabetes mellitus with other diabetic neurological complication: Secondary | ICD-10-CM

## 2014-08-14 HISTORY — DX: Abnormal electrocardiogram (ECG) (EKG): R94.31

## 2014-08-14 LAB — COMPREHENSIVE METABOLIC PANEL
ALT: 16 U/L (ref 0–53)
ANION GAP: 7 (ref 5–15)
AST: 28 U/L (ref 0–37)
Albumin: 2.7 g/dL — ABNORMAL LOW (ref 3.5–5.2)
Alkaline Phosphatase: 238 U/L — ABNORMAL HIGH (ref 39–117)
BUN: 15 mg/dL (ref 6–23)
CO2: 29 mEq/L (ref 19–32)
CREATININE: 1.47 mg/dL — AB (ref 0.50–1.35)
Calcium: 8.5 mg/dL (ref 8.4–10.5)
Chloride: 104 mEq/L (ref 96–112)
GFR calc Af Amer: 56 mL/min — ABNORMAL LOW (ref >90)
GFR calc non Af Amer: 49 mL/min — ABNORMAL LOW (ref >90)
Glucose, Bld: 126 mg/dL — ABNORMAL HIGH (ref 70–99)
Potassium: 4.9 mEq/L (ref 3.7–5.3)
Sodium: 140 mEq/L (ref 137–147)
TOTAL PROTEIN: 6.5 g/dL (ref 6.0–8.3)
Total Bilirubin: 0.5 mg/dL (ref 0.3–1.2)

## 2014-08-14 LAB — GLUCOSE, CAPILLARY
Glucose-Capillary: 107 mg/dL — ABNORMAL HIGH (ref 70–99)
Glucose-Capillary: 222 mg/dL — ABNORMAL HIGH (ref 70–99)

## 2014-08-14 LAB — CBC WITH DIFFERENTIAL/PLATELET
BASOS ABS: 0 10*3/uL (ref 0.0–0.1)
BASOS PCT: 0 % (ref 0–1)
EOS ABS: 0.2 10*3/uL (ref 0.0–0.7)
Eosinophils Relative: 5 % (ref 0–5)
HCT: 25.8 % — ABNORMAL LOW (ref 39.0–52.0)
HEMOGLOBIN: 8.3 g/dL — AB (ref 13.0–17.0)
Lymphocytes Relative: 32 % (ref 12–46)
Lymphs Abs: 1 10*3/uL (ref 0.7–4.0)
MCH: 31.2 pg (ref 26.0–34.0)
MCHC: 32.2 g/dL (ref 30.0–36.0)
MCV: 97 fL (ref 78.0–100.0)
MONOS PCT: 8 % (ref 3–12)
Monocytes Absolute: 0.3 10*3/uL (ref 0.1–1.0)
NEUTROS ABS: 1.8 10*3/uL (ref 1.7–7.7)
NEUTROS PCT: 55 % (ref 43–77)
Platelets: 90 10*3/uL — ABNORMAL LOW (ref 150–400)
RBC: 2.66 MIL/uL — ABNORMAL LOW (ref 4.22–5.81)
RDW: 14.7 % (ref 11.5–15.5)
WBC: 3.3 10*3/uL — ABNORMAL LOW (ref 4.0–10.5)

## 2014-08-14 LAB — CK: CK TOTAL: 73 U/L (ref 7–232)

## 2014-08-14 LAB — CK TOTAL AND CKMB (NOT AT ARMC)
CK, MB: 2.9 ng/mL (ref 0.3–4.0)
RELATIVE INDEX: INVALID (ref 0.0–2.5)
Total CK: 90 U/L (ref 7–232)

## 2014-08-14 MED ORDER — METHADONE HCL 10 MG PO TABS: 5.0000 mg | ORAL_TABLET | Freq: Four times a day (QID) | ORAL | Status: DC

## 2014-08-14 MED ORDER — POLYETHYLENE GLYCOL 3350 17 GM/SCOOP PO POWD: 17.0000 g | Freq: Two times a day (BID) | ORAL | Status: DC

## 2014-08-14 MED ORDER — ATORVASTATIN CALCIUM 80 MG PO TABS: 40.0000 mg | ORAL_TABLET | Freq: Every day | ORAL | Status: DC

## 2014-08-14 MED ORDER — ATENOLOL 25 MG PO TABS: 12.5000 mg | ORAL_TABLET | Freq: Two times a day (BID) | ORAL | Status: DC

## 2014-08-14 MED ORDER — AMITRIPTYLINE HCL 25 MG PO TABS: 25.0000 mg | ORAL_TABLET | Freq: Every evening | ORAL | Status: DC | PRN

## 2014-08-14 NOTE — Evaluation (Signed)
Physical Therapy Evaluation Patient Details Name: [name] MRN: [id number] DOB: 06-25-1951 Today's Date: 08/14/2014   History of Present Illness  [name] is a 63 y.o. male with a history of hypertension, diabetes type 2, peripheral neuropathy, coronary artery disease, rheumatoid arthritis and recent hospitalization and treatment for erhlichiosis. Since that time he has had jerking spells where he will have a view myoclonic jerks that precedes a staring spell. A staring spell last for a minute or 2 and then resolve. During that time, the patient is unaware of surroundings.  There is no post episodal confusion. These happen at random intervals, but not daily. During the episodes, his legs will buckle if he is standing and he has fallen.  Clinical Impression  Pt is a 63 year old male who presents to physical therapy with dx of seizures.  Pt reports a recent increase in falls in the past month to 3, where in the past year he has only fallen 2 times.  Pt reports inability to lift feet off ground and decreased activity tolerance have contributed to the falls.  Pt does have a hx of severe arthritis, with RA changes in the hands and notable decreased knee extension and valgus placement in the knees during gait.  During evaluation, pt was mod (I) with bed mobility skills, min assist and use of RW for transfers, and min guard and use of RW during gait.  Recommend continued PT to address strengthening, balance, and activity tolerance for improved functional mobility skills with transition to HHPT at discharge.  No DME recommendations.       Follow Up Recommendations Home health PT    Equipment Recommendations  None recommended by PT       Precautions / Restrictions Precautions Precautions: Fall Precaution Comments: Severe RA Restrictions Weight Bearing Restrictions: No      Mobility  Bed Mobility Overal bed mobility: Modified Independent                Transfers Overall transfer  level: Needs assistance Equipment used: Rolling walker (2 wheeled) Transfers: Sit to/from Stand Sit to Stand: Min assist         General transfer comment: Use of momentum and multiple attempts to stand.   Ambulation/Gait Ambulation/Gait assistance: Min guard Ambulation Distance (Feet): 100 Feet Assistive device: Rolling walker (2 wheeled) Gait Pattern/deviations: Decreased dorsiflexion - right;Decreased dorsiflexion - left   Gait velocity interpretation: Below normal speed for age/gender General Gait Details: Gait limited secondary to fatigue.  Noted decreased knee extension, and valgus positioning with gait secondary to arthritis.       Balance Overall balance assessment: History of Falls;Needs assistance (3 falls in past month, 2 falls in past year prior to this month) Sitting-balance support: Feet supported;Single extremity supported Sitting balance-Leahy Scale: Good     Standing balance support: Bilateral upper extremity supported;During functional activity Standing balance-Leahy Scale: Fair                               Pertinent Vitals/Pain Pain Assessment: 0-10 Pain Score: 6  Pain Location: Chronic arthritic pain, generalized to all joints Pain Descriptors / Indicators: Aching Pain Intervention(s): Limited activity within patient's tolerance;Repositioned    Home Living Family/patient expects to be discharged to:: Private residence Living Arrangements: Spouse/significant other Available Help at Discharge: Family;Available 24 hours/day (son in law lives nearby) Type of Home: House Home Access: Stairs to enter Entrance Stairs-Rails: Can reach both Entrance  Stairs-Number of Steps: 1 Home Layout: One level Home Equipment: Pine - 2 wheels;Cane - quad;Cane - single point;Shower seat Additional Comments: Tub shower    Prior Function Level of Independence: Needs assistance;Independent with assistive device(s)   Gait / Transfers Assistance Needed: Pt  reprots he is mod (I) with bed mobility skills, and requires assist from wife as needed for transfers to standing.  Pt reports decreasing activity tolerance with gait in the past month, though he is mod (I) with use of RW vs. std cane (~50% use each).               Extremity/Trunk Assessment   Upper Extremity Assessment: LUE deficits/detail;RUE deficits/detail RUE Deficits / Details: RA in Rt hand     LUE Deficits / Details: RA in Rt hand   Lower Extremity Assessment: LLE deficits/detail;RLE deficits/detail;Generalized weakness RLE Deficits / Details: Noted decreased knee extension during gait which pt relates to arthritis LLE Deficits / Details: Noted decreased knee extension during gait which pt related to arthritis     Communication   Communication: No difficulties  Cognition Arousal/Alertness: Awake/alert Behavior During Therapy: WFL for tasks assessed/performed Overall Cognitive Status: Within Functional Limits for tasks assessed                               Assessment/Plan    PT Assessment Patient needs continued PT services  PT Diagnosis     PT Problem List Decreased strength;Decreased range of motion;Decreased activity tolerance;Decreased mobility;Decreased balance  PT Treatment Interventions Balance training;Gait training;Neuromuscular re-education;Functional mobility training;Therapeutic activities;Therapeutic exercise;Patient/family education   PT Goals (Current goals can be found in the Care Plan section) Acute Rehab PT Goals Patient Stated Goal: decrease falls PT Goal Formulation: With patient Time For Goal Achievement: 08/28/14 Potential to Achieve Goals: Good    Frequency Min 3X/week    End of Session Equipment Utilized During Treatment: Gait belt Activity Tolerance: Patient limited by fatigue Patient left: in bed      Functional Assessment Tool Used: Clnical Judgement Functional Limitation: Mobility: Walking and moving  around Mobility: Walking and Moving Around Current Status [id number]): At least 20 percent but less than 40 percent impaired, limited or restricted Mobility: Walking and Moving Around Goal Status [phone]): At least 1 percent but less than 20 percent impaired, limited or restricted    Time: 0816-0844 PT Time Calculation (min): 28 min   Charges:   PT Evaluation $Initial PT Evaluation Tier I: 1 Procedure     PT G Codes:   Functional Assessment Tool Used: Clnical Judgement Functional Limitation: Mobility: Walking and moving around    [facility] 08/14/2014, 9:02 AM

## 2014-08-14 NOTE — Discharge Summary (Signed)
Physician Discharge Summary  [name] [id number] DOB: 11-Sep-1951 DOA: 08/12/2014  PCP: Lala Lund, MD  Admit date: 08/12/2014 Discharge date: 08/14/2014  Time spent: Greater than 30 minutes  Recommendations for Outpatient Follow-up:  1. Recommend followup EKG to evaluate for prolonged QT interval. (Recommended decreasing methadone to 5 mg 3 times a day from 10 mg and decreasing amitriptyline to 25 mg each bedtime from 100 mg). 2. Recommend followup of CBC in the setting of pancytopenia. Consider referral to hematology at [facility].  Discharge Diagnoses:   1. 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 --Differential diagnoses also include medication side effect from chronic methadone, amitriptyline, and possible worsening polyneuropathy. 2. Prolonged QT interval of 526, likely secondary to a combination of methadone and amitriptyline. 3. Sinus bradycardia, secondary to methadone and atenolol. 4. Coronary artery disease, remained stable. 5. Gait disorder from diabetic neuropathy. 6. Acute renal failure, secondary to prerenal azotemia with possible underlying progression to stage III chronic kidney disease. Patient's creatinine was 1.47 at the time of discharge. 7. Pancytopenia, possibly secondary to Plaquenil --At the time of discharge, patient's WBC was 3.3, hemoglobin 8.3, and platelet count 90. --Vitamin B12 level was within normal limits at 752 and TSH was within normal limits at 2.05 8. Rheumatoid arthritis, on chronic prednisone and Plaquenil.  9. Diabetes mellitus with diabetic polyneuropathy. 10. Hypertension. Remained stable.  Discharge Condition: Improved.  Diet recommendation: Carbohydrate modified/heart healthy.  Filed Weights   08/12/14 2023  Weight: 79.379 kg (175 lb)    History of present illness:  The patient is a  63 year old man with a history of coronary artery disease, hypertension, type 2 diabetes mellitus, and peripheral neuropathy, who presented to the emergency department on 08/12/14 with a report of myoclonic jerks preceded by staring spells and episodic confusion. Sometimes the patient is aware of what is happening and sometimes he has not. During some episodes, his legs would buckle and sometimes he has fallen. In the ED, he was afebrile and hemodynamically stable, but he was bradycardic with a heart rate ranging from 54-59 beats per minute. His lab data were significant for a negative urine drug screen, negative alcohol level, platelet count of 98, hemoglobin of 8.2, and creatinine of 1.54. He was admitted for further evaluation and management.  Hospital Course:   1. Myoclonic jerks with episodic staring spells and confusion, possibly secondary to complex partial seizures. The patient was started on gentle IV fluids and supportive treatment. Neuro checks were ordered every 4 hours x24 hours. He was noted to be on relative high dosing of methadone, but he denied any recent increase in the dosing of methadone but did at that when necessary methadone was also taken. He was also noted to be on a relative high dose of amitriptyline for someone who is in his 65s. Therefore, the dose of amitriptyline was decreased from 100 mg to 25 mg. For further evaluation, a number of studies were ordered. MRI of his brain revealed no acute intracranial abnormality, but with mild to moderate age-nonspecific chronic small vessel disease. His EEG revealed no focal or lateralized slowing and no evidence of epileptiform activity. His orthostatic vital signs were not significantly positive. His total CK was within normal limits at 90. His vitamin B12 level and TSH were within normal limits. Dr. Merlene Laughter, neurologist, was consulted. Per his impression, the patient's spells seemed to occur while he was standing from a recumbent  position.  The association with amnesia pointed to a possibility of complex partial seizures, however, he went on to say that the patient's buckling of his knees while standing could be more likely from a gait disorder or medication side effect. He also noted that the patient's high-dose statin therapy could also be associated with a form of myopathy. He was not inclined to start antiseizure medication, but if the patient continues to have recurrent symptoms after a reduction in the dosing of his methadone and amitriptyline, he would recommend starting Keppra or Lamictal empirically. The patient had no further amnesic spells or myoclonic jerking during the hospitalization. Recommended medication changes will be discussed below. 2. Prolonged QT interval and bradycardia. The patient was noted to have prolonged QT interval of greater than 520 and a QTC of greater than 480. His heart rate ranged from 49-62. This was thought to be secondary to both methadone and amitriptyline with possible contribution from atenolol. The seriousness of this finding which included a life threatening arrhythmia was discussed with the patient and his wife. Therefore, he was instructed to decrease the dose of methadone from 10 mg to 5 mg 4 times daily and to decrease amitriptyline from 100 mg to 25 mg each bedtime. He was also instructed to decrease atenolol from 25 mg twice a day to 12.5 mg twice a day. I would recommend a followup EKG at his next appointment with his primary care physician. This was also discussed with the patient and his wife. They voiced understanding. 3. Pancytopenia. The patient was noted to have leukopenia, anemia, and thrombocytopenia. For further evaluation, a number of studies were ordered. His total iron was within normal limits at 48, TIBC 264, ferritin 67, folate greater than 20, and vitamin B12 752. His TSH was within normal limits at 2.05 and his free T4 was within normal limits at 0.98. It was felt that his  pancytopenia was secondary to Plaquenil, but this medication was not discontinued. None of his blood counts were low enough that required transfusion. Nevertheless, his CBC should be monitored and a consideration for hematology consult given. This will be deferred to his primary care physician. 4. Rheumatoid arthritis. This remained stable on Plaquenil and prednisone. 5. Diabetes mellitus with polyneuropathy. His diabetes was fairly controlled during the hospitalization. He was restarted on insulin therapy. 6. Coronary artery disease/hypertension/hyperlipidemia. Remained stable and controlled on chronic medications. The dose of Lipitor was decreased to 40 mg daily from 80 mg because of increased risk of myopathy. 7. Acute renal failure secondary to prerenal azotemia. The patient's creatinine was 1.55 on admission, but it had been as high as 2.88 in June. Therefore, he probably has underlying stage III chronic kidney disease. With hydration, his creatinine improved to 1.47 which may be his baseline.    Procedures:  EEG 8/19: No epileptiform activity and no focal slowing.  Consultations:  Neurologist, Dr. Merlene Laughter  Discharge Exam: Filed Vitals:   08/14/14 1021  BP: 111/54  Pulse: 51  Temp:   Resp:    temperature 97.6. Heart rate 51. After rate 20. Blood pressure 111/54.  General: Pleasant alert 63 year old man in no acute distress. Cardiovascular: S1, S2, with bradycardia Respiratory: Clear to auscultation bilaterally Neurologic: He is alert and oriented x3. Cranial nerves II through XII are intact.  Discharge Instructions You were cared for by a hospitalist during your hospital stay. If you have any questions about your discharge medications or the care you received while you were in the hospital after you  are discharged, you can call the unit and asked to speak with the hospitalist on call if the hospitalist that took care of you is not available. Once you are discharged, your  primary care physician will handle any further medical issues. Please note that NO REFILLS for any discharge medications will be authorized once you are discharged, as it is imperative that you return to your primary care physician (or establish a relationship with a primary care physician if you do not have one) for your aftercare needs so that they can reassess your need for medications and monitor your lab values.  Discharge Instructions   Diet - low sodium heart healthy    Complete by:  As directed      Diet general    Complete by:  As directed      Discharge instructions    Complete by:  As directed   The dose of methadone was decreased from 10 mg to 5 mg 4 times daily because of your abnormal EKG (you can break or cut the methadone tablet in half). Amitriptyline was decreased to 25 mg daily. Lipitor was decreased to 40 mg daily (you can cut or break the tablet in half.). The dose of atenolol was decreased to 12.5 mg twice daily (you can cut or break the tablet in half).     Increase activity slowly    Complete by:  As directed             Medication List         amitriptyline 25 MG tablet  Commonly known as:  ELAVIL  Take 1 tablet (25 mg total) by mouth at bedtime as needed for sleep.     aspirin 81 MG chewable tablet  Chew 81 mg by mouth every morning.     atenolol 25 MG tablet  Commonly known as:  TENORMIN  Take 0.5 tablets (12.5 mg total) by mouth 2 (two) times daily.     atorvastatin 80 MG tablet  Commonly known as:  LIPITOR  Take 0.5 tablets (40 mg total) by mouth at bedtime.     gabapentin 300 MG capsule  Commonly known as:  NEURONTIN  Take 300 mg by mouth 3 (three) times daily.     hydroxychloroquine 200 MG tablet  Commonly known as:  PLAQUENIL  Take 200 mg by mouth 2 (two) times daily.     hydroxypropyl methylcellulose 2.5 % ophthalmic solution  Commonly known as:  ISOPTO TEARS  Place 1 drop into both eyes daily as needed. For dry eye relief     insulin  NPH-regular Human (70-30) 100 UNIT/ML injection  Commonly known as:  NOVOLIN 70/30  Inject 10-12 Units into the skin 2 (two) times daily. 10 units in the morning and 12 units at bedtime     ketoconazole 2 % cream  Commonly known as:  NIZORAL  Apply 1 application topically 2 (two) times daily.     methadone 5 MG tablet  Commonly known as:  DOLOPHINE  Take 5 mg by mouth as needed. For breakthrough pain     methadone 10 MG tablet  Commonly known as:  DOLOPHINE  Take 0.5 tablets (5 mg total) by mouth 4 (four) times daily.     polyethylene glycol powder powder  Commonly known as:  MIRALAX  Take 17 g by mouth 2 (two) times daily.     predniSONE 5 MG tablet  Commonly known as:  DELTASONE  Take 5 mg by mouth every morning.  tamsulosin 0.4 MG Caps capsule  Commonly known as:  FLOMAX  Take 0.4 mg by mouth at bedtime.       Allergies  Allergen Reactions  . Sulfa Antibiotics Rash       Follow-up Information   Follow up with JONAS, DANIEL E, MD. (follow up as scheduled)    Specialty:  Internal Medicine   Contact information:   [id number] [place] [id number] [phone]        The results of significant diagnostics from this hospitalization (including imaging, microbiology, ancillary and laboratory) are listed below for reference.    Significant Diagnostic Studies: Mr Herby Abraham Contrast  08/13/2014   CLINICAL DATA:  63 year old male with syncope. Possible seizure activity. Initial encounter.  EXAM: MRI HEAD WITHOUT CONTRAST  TECHNIQUE: Multiplanar, multiecho pulse sequences of the brain and surrounding structures were obtained without intravenous contrast.  COMPARISON:  Head CT without contrast 06/18/2014.  FINDINGS: Cerebral volume is within normal limits for age. No restricted diffusion to suggest acute infarction. No midline shift, mass effect, evidence of mass lesion, ventriculomegaly, extra-axial collection or acute intracranial  hemorrhage. Cervicomedullary junction and pituitary are within normal limits. Negative visualized cervical spine. Major intracranial vascular flow voids are preserved.  Scattered punctate and patchy cerebral white matter T2 and FLAIR hyperintensity, nonspecific and mild to moderate for age. No cortical encephalomalacia identified. Mesial temporal lobe structures including the hippocampi appear within normal limits. Deep gray matter nuclei, brainstem and cerebellum are within normal limits. Visible internal auditory structures appear normal.  Visualized paranasal sinuses and mastoids are clear. Visualized orbit soft tissues are within normal limits. Visualized scalp soft tissues are within normal limits. Normal bone marrow signal.  IMPRESSION: No acute intracranial abnormality. Mild to moderate for age nonspecific white matter signal changes, most commonly due to chronic small vessel disease.   Electronically Signed   By: Lars Pinks M.D.   On: 08/13/2014 13:05    Microbiology: No results found for this or any previous visit (from the past 240 hour(s)).   Labs: Basic Metabolic Panel:  Recent Labs Lab 08/12/14 2110 08/13/14 0532 08/14/14 0532  NA 136* 137 140  K 5.1 5.0 4.9  CL 99 100 104  CO2 28 28 29   GLUCOSE 161* 145* 126*  BUN 16 16 15   CREATININE 1.55* 1.54* 1.47*  CALCIUM 8.6 8.5 8.5   Liver Function Tests:  Recent Labs Lab 08/14/14 0532  AST 28  ALT 16  ALKPHOS 238*  BILITOT 0.5  PROT 6.5  ALBUMIN 2.7*   No results found for this basename: LIPASE, AMYLASE,  in the last 168 hours No results found for this basename: AMMONIA,  in the last 168 hours CBC:  Recent Labs Lab 08/12/14 2110 08/13/14 0532 08/14/14 0532  WBC 4.4 3.7* 3.3*  NEUTROABS 3.2  --  1.8  HGB 8.2* 7.9* 8.3*  HCT 25.5* 24.5* 25.8*  MCV 96.2 96.1 97.0  PLT 98* 96* 90*   Cardiac Enzymes:  Recent Labs Lab 08/14/14 1035  CKTOTAL 73   BNP: BNP (last 3 results) No results found for this basename:  PROBNP,  in the last 8760 hours CBG:  Recent Labs Lab 08/13/14 1123 08/13/14 1632 08/13/14 2111 08/14/14 0755 08/14/14 1204  GLUCAP 115* 164* 198* 107* 222*       Signed:  Saphire Barnhart  [facility] 08/14/2014, 1:18 PM

## 2014-08-14 NOTE — Progress Notes (Signed)
Pt discharged home today per Dr. Caryn Section. Pt's IV site D/C'd and WNL. Pt's VSS. Pt provided with home medication list, discharge instructions and prescriptions. Verbalized understanding. Pt left floor via WC in stable condition accompanied by NT.

## 2014-08-16 NOTE — Procedures (Signed)
  [place]enton A. Merlene Laughter, MD     [email or web address]           HISTORY: This is a 63 year old man  Episodes of  Jerking/myoclonus And unresponsiveness suspicious for seizures.  MEDICATIONS: Scheduled Meds: Continuous Infusions: PRN Meds:.    Prior to Admission medications   Medication Sig Start Date End Date Taking? Authorizing Provider  amitriptyline (ELAVIL) 25 MG tablet Take 1 tablet (25 mg total) by mouth at bedtime as needed for sleep. 08/14/14   Rexene Alberts, MD  aspirin 81 MG chewable tablet Chew 81 mg by mouth every morning.     Historical Provider, MD  atenolol (TENORMIN) 25 MG tablet Take 0.5 tablets (12.5 mg total) by mouth 2 (two) times daily. 08/14/14   Rexene Alberts, MD  atorvastatin (LIPITOR) 80 MG tablet Take 0.5 tablets (40 mg total) by mouth at bedtime. 08/14/14   Rexene Alberts, MD  gabapentin (NEURONTIN) 300 MG capsule Take 300 mg by mouth 3 (three) times daily.    Historical Provider, MD  hydroxychloroquine (PLAQUENIL) 200 MG tablet Take 200 mg by mouth 2 (two) times daily.    Historical Provider, MD  hydroxypropyl methylcellulose (ISOPTO TEARS) 2.5 % ophthalmic solution Place 1 drop into both eyes daily as needed. For dry eye relief    Historical Provider, MD  insulin NPH-regular Human (NOVOLIN 70/30) (70-30) 100 UNIT/ML injection Inject 10-12 Units into the skin 2 (two) times daily. 10 units in the morning and 12 units at bedtime    Historical Provider, MD  ketoconazole (NIZORAL) 2 % cream Apply 1 application topically 2 (two) times daily. 03/07/13   Historical Provider, MD  methadone (DOLOPHINE) 10 MG tablet Take 0.5 tablets (5 mg total) by mouth 4 (four) times daily. 08/14/14   Rexene Alberts, MD  methadone (DOLOPHINE) 5 MG tablet Take 5 mg by mouth as needed. For breakthrough pain    Historical Provider, MD  polyethylene glycol powder (MIRALAX) powder Take 17 g by mouth 2 (two) times daily. 08/14/14   Rexene Alberts, MD  predniSONE (DELTASONE) 5 MG  tablet Take 5 mg by mouth every morning.     Historical Provider, MD  Tamsulosin HCl (FLOMAX) 0.4 MG CAPS Take 0.4 mg by mouth at bedtime.     Historical Provider, MD      ANALYSIS: A 16 channel recording using standard 10 20 measurements is conducted for 20 minutes. There is a well-formed dominant rhythm of 10 Hz which Attenuates to eye opening. There is beta activity  Observed in the frontal areas. Awake and drowsy activities are recorded.  There are no hypoventilation and photic stimulation carried out. There are no focal or  And lateralized slowing. There are no epileptiform activity  Observed.  The patient is noted  To have 1 myoclonic  Jerk  Without EEG correlate.   IMPRESSION: 1. Normal recording.      Breona Cherubin A. Merlene Laughter, M.D.  Diplomate, Tax adviser of Psychiatry and Neurology ( Neurology).

## 2014-09-11 ENCOUNTER — Encounter (HOSPITAL_COMMUNITY): Payer: Self-pay | Admitting: Emergency Medicine

## 2014-09-11 ENCOUNTER — Emergency Department (HOSPITAL_COMMUNITY)
Admission: EM | Admit: 2014-09-11 | Discharge: 2014-09-11 | Disposition: A | Discharge status: Home / Self Care | Payer: Medicare Other | Attending: Emergency Medicine | Admitting: Emergency Medicine

## 2014-09-11 ENCOUNTER — Emergency Department (HOSPITAL_COMMUNITY): Payer: Medicare Other

## 2014-09-11 DIAGNOSIS — S199XXA Unspecified injury of neck, initial encounter: Secondary | ICD-10-CM

## 2014-09-11 DIAGNOSIS — Z9889 Other specified postprocedural states: Secondary | ICD-10-CM | POA: Insufficient documentation

## 2014-09-11 DIAGNOSIS — Z862 Personal history of diseases of the blood and blood-forming organs and certain disorders involving the immune mechanism: Secondary | ICD-10-CM | POA: Insufficient documentation

## 2014-09-11 DIAGNOSIS — IMO0002 Reserved for concepts with insufficient information to code with codable children: Secondary | ICD-10-CM | POA: Diagnosis not present

## 2014-09-11 DIAGNOSIS — E1142 Type 2 diabetes mellitus with diabetic polyneuropathy: Secondary | ICD-10-CM | POA: Insufficient documentation

## 2014-09-11 DIAGNOSIS — S139XXA Sprain of joints and ligaments of unspecified parts of neck, initial encounter: Secondary | ICD-10-CM | POA: Insufficient documentation

## 2014-09-11 DIAGNOSIS — I2510 Atherosclerotic heart disease of native coronary artery without angina pectoris: Secondary | ICD-10-CM | POA: Insufficient documentation

## 2014-09-11 DIAGNOSIS — Z7982 Long term (current) use of aspirin: Secondary | ICD-10-CM | POA: Diagnosis not present

## 2014-09-11 DIAGNOSIS — E1149 Type 2 diabetes mellitus with other diabetic neurological complication: Secondary | ICD-10-CM | POA: Diagnosis not present

## 2014-09-11 DIAGNOSIS — Y9389 Activity, other specified: Secondary | ICD-10-CM | POA: Insufficient documentation

## 2014-09-11 DIAGNOSIS — M069 Rheumatoid arthritis, unspecified: Secondary | ICD-10-CM | POA: Insufficient documentation

## 2014-09-11 DIAGNOSIS — Z79899 Other long term (current) drug therapy: Secondary | ICD-10-CM | POA: Diagnosis not present

## 2014-09-11 DIAGNOSIS — I10 Essential (primary) hypertension: Secondary | ICD-10-CM | POA: Insufficient documentation

## 2014-09-11 DIAGNOSIS — S0993XA Unspecified injury of face, initial encounter: Secondary | ICD-10-CM | POA: Diagnosis present

## 2014-09-11 DIAGNOSIS — Z794 Long term (current) use of insulin: Secondary | ICD-10-CM | POA: Diagnosis not present

## 2014-09-11 DIAGNOSIS — S161XXA Strain of muscle, fascia and tendon at neck level, initial encounter: Secondary | ICD-10-CM

## 2014-09-11 DIAGNOSIS — Y92410 Unspecified street and highway as the place of occurrence of the external cause: Secondary | ICD-10-CM | POA: Insufficient documentation

## 2014-09-11 MED ORDER — HYDROCODONE-ACETAMINOPHEN 5-325 MG PO TABS: 1.0000 | ORAL_TABLET | ORAL | Status: DC | PRN

## 2014-09-11 MED ORDER — METHOCARBAMOL 500 MG PO TABS: 500.0000 mg | ORAL_TABLET | Freq: Two times a day (BID) | ORAL | Status: DC

## 2014-09-11 MED ORDER — NAPROXEN 500 MG PO TABS: 500.0000 mg | ORAL_TABLET | Freq: Two times a day (BID) | ORAL | Status: DC
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
## 2014-09-11 NOTE — Discharge Instructions (Signed)
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
## 2014-09-18 NOTE — ED Provider Notes (Signed)
Medical screening examination/treatment/procedure(s) were performed by non-physician practitioner and as supervising physician I was immediately available for consultation/collaboration.   EKG Interpretation None        Pranit Owensby, MD 09/18/14 0721 

## 2015-01-22 DIAGNOSIS — H16223 Keratoconjunctivitis sicca, not specified as Sjogren's, bilateral: Secondary | ICD-10-CM | POA: Diagnosis not present

## 2015-01-28 DIAGNOSIS — H269 Unspecified cataract: Secondary | ICD-10-CM | POA: Diagnosis not present

## 2015-01-28 DIAGNOSIS — D61818 Other pancytopenia: Secondary | ICD-10-CM | POA: Diagnosis not present

## 2015-01-28 DIAGNOSIS — E780 Pure hypercholesterolemia: Secondary | ICD-10-CM | POA: Diagnosis not present

## 2015-01-28 DIAGNOSIS — Z89421 Acquired absence of other right toe(s): Secondary | ICD-10-CM | POA: Diagnosis not present

## 2015-01-28 DIAGNOSIS — E11622 Type 2 diabetes mellitus with other skin ulcer: Secondary | ICD-10-CM | POA: Diagnosis not present

## 2015-01-28 DIAGNOSIS — M2550 Pain in unspecified joint: Secondary | ICD-10-CM | POA: Diagnosis not present

## 2015-01-28 DIAGNOSIS — M069 Rheumatoid arthritis, unspecified: Secondary | ICD-10-CM | POA: Diagnosis not present

## 2015-01-28 DIAGNOSIS — A7740 Ehrlichiosis, unspecified: Secondary | ICD-10-CM | POA: Diagnosis not present

## 2015-01-28 DIAGNOSIS — D638 Anemia in other chronic diseases classified elsewhere: Secondary | ICD-10-CM | POA: Diagnosis not present

## 2015-01-28 DIAGNOSIS — R55 Syncope and collapse: Secondary | ICD-10-CM | POA: Diagnosis not present

## 2015-01-28 DIAGNOSIS — R1310 Dysphagia, unspecified: Secondary | ICD-10-CM | POA: Diagnosis not present

## 2015-01-28 DIAGNOSIS — K5900 Constipation, unspecified: Secondary | ICD-10-CM | POA: Diagnosis not present

## 2015-01-28 DIAGNOSIS — E118 Type 2 diabetes mellitus with unspecified complications: Secondary | ICD-10-CM | POA: Diagnosis not present

## 2015-01-28 DIAGNOSIS — M869 Osteomyelitis, unspecified: Secondary | ICD-10-CM | POA: Diagnosis not present

## 2015-01-28 DIAGNOSIS — G8929 Other chronic pain: Secondary | ICD-10-CM | POA: Diagnosis not present

## 2015-01-28 DIAGNOSIS — I10 Essential (primary) hypertension: Secondary | ICD-10-CM | POA: Diagnosis not present

## 2015-01-29 ENCOUNTER — Emergency Department (HOSPITAL_COMMUNITY): Payer: No Typology Code available for payment source

## 2015-01-29 ENCOUNTER — Emergency Department (HOSPITAL_COMMUNITY)
Admission: EM | Admit: 2015-01-29 | Discharge: 2015-01-29 | Disposition: A | Discharge status: Home / Self Care | Payer: No Typology Code available for payment source | Attending: Emergency Medicine | Admitting: Emergency Medicine

## 2015-01-29 ENCOUNTER — Encounter (HOSPITAL_COMMUNITY): Payer: Self-pay | Admitting: *Deleted

## 2015-01-29 DIAGNOSIS — M069 Rheumatoid arthritis, unspecified: Secondary | ICD-10-CM | POA: Insufficient documentation

## 2015-01-29 DIAGNOSIS — M549 Dorsalgia, unspecified: Secondary | ICD-10-CM | POA: Diagnosis not present

## 2015-01-29 DIAGNOSIS — S39012A Strain of muscle, fascia and tendon of lower back, initial encounter: Secondary | ICD-10-CM | POA: Insufficient documentation

## 2015-01-29 DIAGNOSIS — M542 Cervicalgia: Secondary | ICD-10-CM | POA: Diagnosis not present

## 2015-01-29 DIAGNOSIS — S299XXA Unspecified injury of thorax, initial encounter: Secondary | ICD-10-CM | POA: Diagnosis not present

## 2015-01-29 DIAGNOSIS — Z79899 Other long term (current) drug therapy: Secondary | ICD-10-CM | POA: Insufficient documentation

## 2015-01-29 DIAGNOSIS — I2510 Atherosclerotic heart disease of native coronary artery without angina pectoris: Secondary | ICD-10-CM | POA: Insufficient documentation

## 2015-01-29 DIAGNOSIS — E1140 Type 2 diabetes mellitus with diabetic neuropathy, unspecified: Secondary | ICD-10-CM | POA: Insufficient documentation

## 2015-01-29 DIAGNOSIS — I10 Essential (primary) hypertension: Secondary | ICD-10-CM | POA: Insufficient documentation

## 2015-01-29 DIAGNOSIS — Z794 Long term (current) use of insulin: Secondary | ICD-10-CM | POA: Insufficient documentation

## 2015-01-29 DIAGNOSIS — Z862 Personal history of diseases of the blood and blood-forming organs and certain disorders involving the immune mechanism: Secondary | ICD-10-CM | POA: Diagnosis not present

## 2015-01-29 DIAGNOSIS — Y92410 Unspecified street and highway as the place of occurrence of the external cause: Secondary | ICD-10-CM | POA: Diagnosis not present

## 2015-01-29 DIAGNOSIS — Z792 Long term (current) use of antibiotics: Secondary | ICD-10-CM | POA: Diagnosis not present

## 2015-01-29 DIAGNOSIS — Y998 Other external cause status: Secondary | ICD-10-CM | POA: Diagnosis not present

## 2015-01-29 DIAGNOSIS — S3992XA Unspecified injury of lower back, initial encounter: Secondary | ICD-10-CM | POA: Diagnosis not present

## 2015-01-29 DIAGNOSIS — Y9389 Activity, other specified: Secondary | ICD-10-CM | POA: Insufficient documentation

## 2015-01-29 MED ORDER — METHOCARBAMOL 500 MG PO TABS
500.0000 mg | ORAL_TABLET | Freq: Once | ORAL | Status: AC
  Administered 2015-01-29: 500 mg via ORAL
  Filled 2015-01-29: qty 1

## 2015-01-29 MED ORDER — METHOCARBAMOL 500 MG PO TABS: 500.0000 mg | ORAL_TABLET | Freq: Two times a day (BID) | ORAL | Status: DC

## 2015-01-29 NOTE — ED Notes (Signed)
Discharge instructions given and reviewed with patient and spouse.  Patient verbalized understanding to take medication as directed and to follow up as needed.  Patient ambulatory; discharged home in good condition.
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
## 2015-02-07 DIAGNOSIS — M5030 Other cervical disc degeneration, unspecified cervical region: Secondary | ICD-10-CM | POA: Diagnosis not present

## 2015-02-07 DIAGNOSIS — M4806 Spinal stenosis, lumbar region: Secondary | ICD-10-CM | POA: Diagnosis not present

## 2015-02-07 DIAGNOSIS — M5489 Other dorsalgia: Secondary | ICD-10-CM | POA: Diagnosis not present

## 2015-02-07 DIAGNOSIS — M549 Dorsalgia, unspecified: Secondary | ICD-10-CM | POA: Diagnosis not present

## 2015-02-07 DIAGNOSIS — Z79899 Other long term (current) drug therapy: Secondary | ICD-10-CM | POA: Diagnosis not present

## 2015-02-07 DIAGNOSIS — M069 Rheumatoid arthritis, unspecified: Secondary | ICD-10-CM | POA: Diagnosis not present

## 2015-02-07 DIAGNOSIS — S299XXA Unspecified injury of thorax, initial encounter: Secondary | ICD-10-CM | POA: Diagnosis not present

## 2015-02-11 ENCOUNTER — Encounter (HOSPITAL_COMMUNITY): Payer: Self-pay | Admitting: *Deleted

## 2015-02-11 ENCOUNTER — Emergency Department (HOSPITAL_COMMUNITY): Payer: Medicare Other

## 2015-02-11 ENCOUNTER — Emergency Department (HOSPITAL_COMMUNITY)
Admission: EM | Admit: 2015-02-11 | Discharge: 2015-02-11 | Disposition: A | Discharge status: Home / Self Care | Payer: Medicare Other | Attending: Emergency Medicine | Admitting: Emergency Medicine

## 2015-02-11 DIAGNOSIS — M069 Rheumatoid arthritis, unspecified: Secondary | ICD-10-CM | POA: Diagnosis not present

## 2015-02-11 DIAGNOSIS — R51 Headache: Secondary | ICD-10-CM | POA: Diagnosis not present

## 2015-02-11 DIAGNOSIS — E1140 Type 2 diabetes mellitus with diabetic neuropathy, unspecified: Secondary | ICD-10-CM | POA: Insufficient documentation

## 2015-02-11 DIAGNOSIS — S63266A Dislocation of metacarpophalangeal joint of right little finger, initial encounter: Secondary | ICD-10-CM | POA: Diagnosis not present

## 2015-02-11 DIAGNOSIS — W06XXXA Fall from bed, initial encounter: Secondary | ICD-10-CM | POA: Insufficient documentation

## 2015-02-11 DIAGNOSIS — Y9289 Other specified places as the place of occurrence of the external cause: Secondary | ICD-10-CM | POA: Diagnosis not present

## 2015-02-11 DIAGNOSIS — Y998 Other external cause status: Secondary | ICD-10-CM | POA: Insufficient documentation

## 2015-02-11 DIAGNOSIS — Z7982 Long term (current) use of aspirin: Secondary | ICD-10-CM | POA: Insufficient documentation

## 2015-02-11 DIAGNOSIS — G8929 Other chronic pain: Secondary | ICD-10-CM | POA: Diagnosis not present

## 2015-02-11 DIAGNOSIS — Z9889 Other specified postprocedural states: Secondary | ICD-10-CM | POA: Diagnosis not present

## 2015-02-11 DIAGNOSIS — Z862 Personal history of diseases of the blood and blood-forming organs and certain disorders involving the immune mechanism: Secondary | ICD-10-CM | POA: Insufficient documentation

## 2015-02-11 DIAGNOSIS — S63264A Dislocation of metacarpophalangeal joint of right ring finger, initial encounter: Secondary | ICD-10-CM | POA: Diagnosis not present

## 2015-02-11 DIAGNOSIS — Z794 Long term (current) use of insulin: Secondary | ICD-10-CM | POA: Insufficient documentation

## 2015-02-11 DIAGNOSIS — Y9389 Activity, other specified: Secondary | ICD-10-CM | POA: Insufficient documentation

## 2015-02-11 DIAGNOSIS — I10 Essential (primary) hypertension: Secondary | ICD-10-CM | POA: Diagnosis not present

## 2015-02-11 DIAGNOSIS — Z79899 Other long term (current) drug therapy: Secondary | ICD-10-CM | POA: Insufficient documentation

## 2015-02-11 DIAGNOSIS — S63265A Dislocation of metacarpophalangeal joint of left ring finger, initial encounter: Secondary | ICD-10-CM | POA: Diagnosis not present

## 2015-02-11 DIAGNOSIS — S199XXA Unspecified injury of neck, initial encounter: Secondary | ICD-10-CM | POA: Diagnosis not present

## 2015-02-11 DIAGNOSIS — I2510 Atherosclerotic heart disease of native coronary artery without angina pectoris: Secondary | ICD-10-CM | POA: Diagnosis not present

## 2015-02-11 DIAGNOSIS — S63263A Dislocation of metacarpophalangeal joint of left middle finger, initial encounter: Secondary | ICD-10-CM | POA: Diagnosis not present

## 2015-02-11 DIAGNOSIS — S80211A Abrasion, right knee, initial encounter: Secondary | ICD-10-CM | POA: Diagnosis not present

## 2015-02-11 DIAGNOSIS — S0512XA Contusion of eyeball and orbital tissues, left eye, initial encounter: Secondary | ICD-10-CM | POA: Diagnosis not present

## 2015-02-11 DIAGNOSIS — S0083XA Contusion of other part of head, initial encounter: Secondary | ICD-10-CM | POA: Diagnosis not present

## 2015-02-11 DIAGNOSIS — S63267A Dislocation of metacarpophalangeal joint of left little finger, initial encounter: Secondary | ICD-10-CM | POA: Diagnosis not present

## 2015-02-11 DIAGNOSIS — S00212A Abrasion of left eyelid and periocular area, initial encounter: Secondary | ICD-10-CM | POA: Diagnosis not present

## 2015-02-11 DIAGNOSIS — S0990XA Unspecified injury of head, initial encounter: Secondary | ICD-10-CM | POA: Diagnosis not present

## 2015-02-11 DIAGNOSIS — W19XXXA Unspecified fall, initial encounter: Secondary | ICD-10-CM

## 2015-02-11 DIAGNOSIS — T148XXA Other injury of unspecified body region, initial encounter: Secondary | ICD-10-CM

## 2015-02-11 DIAGNOSIS — S63261A Dislocation of metacarpophalangeal joint of left index finger, initial encounter: Secondary | ICD-10-CM | POA: Diagnosis not present

## 2015-02-11 HISTORY — DX: Dorsalgia, unspecified: M54.9

## 2015-02-11 HISTORY — DX: Other chronic pain: G89.29

## 2015-02-11 HISTORY — DX: Cervicalgia: M54.2

## 2015-02-11 NOTE — ED Provider Notes (Signed)
CSN: [id number]     Arrival date & time 02/11/15  1230 History   First MD Initiated Contact with Patient 02/11/15 1647     Chief Complaint  Patient presents with  . Head Injury  . Fall     HPI Pt was seen at 1650. Per pt and his family, c/o sudden onset and resolution of one episode of fall last night. Pt states "my back seized up the way it does" and "I fell." States he fell forward, hitting his right head, face and knee against the ground. Pt states he has chronic joints deformities of multiple joints due to RA, and states he "caught myself on my knuckles." Denies syncope/LOC, no CP/SOB, no abd pain, no N/V/D, no focal motor weakness, no tingling/numbness in extremities.    Past Medical History  Diagnosis Date  . Rheumatoid arthritis(714.0)   . Coronary artery disease 1993    angioplasty after AMI  . Diabetes mellitus   . Hypertension   . Diabetic neuropathy   . 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  . Pancytopenia 08/13/2014  . Seizure 08/13/2014    Pt denies  . Chronic neck pain   . Chronic back pain    Past Surgical History  Procedure Laterality Date  . Joint replacement    . Fracture surgery    . Cardiac surgery     Family History  Problem Relation Age of Onset  . Diabetes Father   . Dementia Father   . Heart attack Brother     multiple brothers with MIs  . Hypertension Mother   . Stroke Mother    History  Substance Use Topics  . Smoking status: Never Smoker   . Smokeless tobacco: Not on file  . Alcohol Use: No    Review of Systems ROS: Statement: All systems negative except as marked or noted in the HPI; Constitutional: Negative for fever and chills. ; ; Eyes: Negative for eye pain, redness and discharge. ; ; ENMT: Negative for ear pain, hoarseness, nasal congestion, sinus pressure and sore throat. ; ; Cardiovascular: Negative for chest pain, palpitations, diaphoresis, dyspnea and peripheral edema. ; ;  Respiratory: Negative for cough, wheezing and stridor. ; ; Gastrointestinal: Negative for nausea, vomiting, diarrhea, abdominal pain, blood in stool, hematemesis, jaundice and rectal bleeding. . ; ; Genitourinary: Negative for dysuria, flank pain and hematuria. ; ; Musculoskeletal: +right knee pain, right face/head pain. Negative for back pain and neck pain. Negative for swelling.; ; Skin: +abrasion, bruising. Negative for pruritus, rash, blisters, and skin lesion.; ; Neuro: Negative for headache, lightheadedness and neck stiffness. Negative for weakness, altered level of consciousness , altered mental status, extremity weakness, paresthesias, involuntary movement, seizure and syncope.      Allergies  Sulfa antibiotics  Home Medications   Prior to Admission medications   Medication Sig Start Date End Date Taking? Authorizing Provider  amitriptyline (ELAVIL) 25 MG tablet Take 1 tablet (25 mg total) by mouth at bedtime as needed for sleep. 08/14/14  Yes Rexene Alberts, MD  aspirin 81 MG chewable tablet Chew 81 mg by mouth every morning.    Yes Historical Provider, MD  atenolol (TENORMIN) 25 MG tablet Take 0.5 tablets (12.5 mg total) by mouth 2 (two) times daily. 08/14/14  Yes Rexene Alberts, MD  atorvastatin (LIPITOR) 80 MG tablet Take 0.5 tablets (40 mg total) by mouth at bedtime. 08/14/14  Yes Rexene Alberts, MD  hydroxypropyl methylcellulose (ISOPTO TEARS) 2.5 % ophthalmic solution  Place 1 drop into both eyes daily as needed. For dry eye relief   Yes Historical Provider, MD  insulin NPH-regular Human (NOVOLIN 70/30) (70-30) 100 UNIT/ML injection Inject 10-12 Units into the skin 2 (two) times daily. 10 units in the morning and 12 units at bedtime   Yes Historical Provider, MD  methadone (DOLOPHINE) 10 MG tablet Take 10 mg by mouth 4 (four) times daily. 02/10/15  Yes Historical Provider, MD  methadone (DOLOPHINE) 5 MG tablet Take 5 mg by mouth every 6 (six) hours as needed. 02/10/15  Yes Historical  Provider, MD  methocarbamol (ROBAXIN) 500 MG tablet Take 1 tablet (500 mg total) by mouth 2 (two) times daily. 01/29/15  Yes Tammy L. Triplett, PA-C  polyethylene glycol powder (MIRALAX) powder Take 17 g by mouth 2 (two) times daily. 08/14/14  Yes Rexene Alberts, MD  predniSONE (DELTASONE) 5 MG tablet Take 5 mg by mouth every morning.    Yes Historical Provider, MD  pregabalin (LYRICA) 50 MG capsule Take 50 mg by mouth 2 (two) times daily.   Yes Historical Provider, MD  Tamsulosin HCl (FLOMAX) 0.4 MG CAPS Take 0.4 mg by mouth at bedtime.    Yes Historical Provider, MD   BP 111/61 mmHg  Pulse 66  Temp(Src) 97.7 F (36.5 C) (Oral)  Resp 18  Ht 5\' 10"  (1.778 m)  Wt 170 lb (77.111 kg)  BMI 24.39 kg/m2  SpO2 100% Physical Exam  1655: Physical examination: Vital signs and O2 SAT: Reviewed; Constitutional: Well developed, Well nourished, Well hydrated, In no acute distress; Head and Face: Normocephalic, No scalp hematomas. +superficial abrasion with scab to right eyebrow. +ecchymosis to right eyebrow and upper eyelid. Non-tender to palp superior and inferior orbital rim areas.  No zygoma tenderness.  No mandibular tenderness.; Eyes: EOMI, PERRL, No scleral icterus. No obvious hyphema or hypopyon bilat.; ENMT: Mouth and pharynx normal, Left TM normal, Right TM normal, Mucous membranes moist, +teeth and tongue intact.  No intraoral or intranasal bleeding.  No septal hematomas.  No trismus, no malocclusion.; Neck: Supple, Full range of motion, No lymphadenopathy; Cardiovascular: Regular rate and rhythm, No gallop; Respiratory: Breath sounds clear & equal bilaterally, No wheezes.  Speaking full sentences with ease, Normal respiratory effort/excursion; Chest: Nontender, Movement normal; Abdomen: Soft, Nontender, Nondistended, Normal bowel sounds; Genitourinary: No CVA tenderness; Spine:  No midline CS, TS, LS tenderness. No ecchymosis, no abrasions.;; Extremities: Multiple chronic joints deformities of bilat UE's  and LE's, including all fingers. No open wounds, abrasions, or ecchymosis to hands/fingers bilat. +FROM right knee, including able to lift extended RLE off stretcher, and extend right lower leg against resistance.  No ligamentous laxity.  No patellar or quad tendon step-offs.  NMS intact right foot, strong pedal pp. +plantarflexion of right foot w/calf squeeze.  No palpable gap right Achilles's tendon.  No proximal fibular head tenderness.  No edema, erythema, warmth, ecchymosis, open wounds; +chronic deformity. Pulses normal, No tenderness, No edema, No calf edema or asymmetry.; Neuro: AA&Ox3, Major CN grossly intact.  Speech clear. No gross focal motor or sensory deficits in extremities.; Skin: Color normal, Warm, Dry.   ED Course  Procedures     EKG Interpretation None      MDM  MDM Reviewed: nursing note, previous chart and vitals Interpretation: x-ray and CT scan   Ct Head Wo Contrast 02/11/2015   CLINICAL DATA:  Pt states he fell off the bed last night and states "a couple of goose eggs and a little cut" to  his head. Bruising to left eye and small abrasion/scabbed laceration above right eye. Also states headache last night after falling.  EXAM: CT HEAD WITHOUT CONTRAST  CT MAXILLOFACIAL WITHOUT CONTRAST  CT CERVICAL SPINE WITHOUT CONTRAST  TECHNIQUE: Multidetector CT imaging of the head, cervical spine, and maxillofacial structures were performed using the standard protocol without intravenous contrast. Multiplanar CT image reconstructions of the cervical spine and maxillofacial structures were also generated.  COMPARISON:  Head CT 06/18/2014 and CT cervical spine 02/07/2015  FINDINGS: CT HEAD FINDINGS  Ventricles, cisterns and other CSF spaces are normal. There is mild chronic ischemic microvascular disease. There is no mass, mass effect, shift of midline structures or acute hemorrhage. Minimal soft tissue swelling over the right supraorbital region. No evidence of fracture.  CT  MAXILLOFACIAL FINDINGS  There is minimal right periorbital and supraorbital soft tissue swelling. Globes and retrobulbar spaces are normal and symmetric. Remaining soft tissues are unremarkable. There is no acute fracture. The visualized paranasal sinuses and mastoid air cells are clear. There are moderate degenerative changes of the temporomandibular joints bilaterally.  CT CERVICAL SPINE FINDINGS  Examination demonstrates mild spondylosis of the cervical spine. Again noted disc space narrowing with associated endplate Schmorl's nodes involving the C5-6 level. These findings appear chronic and are unchanged. There is very subtle 1-2 mm posterior subluxation of C-spine on C6 unchanged. There is no acute fracture or traumatic subluxation. Prevertebral soft tissues are normal. There is mild uncovertebral joint spurring. Remainder the exam is unchanged.  IMPRESSION: No acute intracranial findings.  Mild chronic ischemic microvascular disease.  Mild right periorbital/ supraorbital soft tissue swelling. No facial bone fracture.  No acute cervical spine injury.  Mild spondylosis of the cervical spine with stable chronic changes at the C5-6 level including disc space narrowing, Schmorl's node formation and slight posterior subluxation of C5 on C6.  Moderate degenerative changes of the temporomandibular joints.   Electronically Signed   By: Marin Olp M.D.   On: 02/11/2015 18:28   Ct Cervical Spine Wo Contrast 02/11/2015   CLINICAL DATA:  Pt states he fell off the bed last night and states "a couple of goose eggs and a little cut" to his head. Bruising to left eye and small abrasion/scabbed laceration above right eye. Also states headache last night after falling.  EXAM: CT HEAD WITHOUT CONTRAST  CT MAXILLOFACIAL WITHOUT CONTRAST  CT CERVICAL SPINE WITHOUT CONTRAST  TECHNIQUE: Multidetector CT imaging of the head, cervical spine, and maxillofacial structures were performed using the standard protocol without  intravenous contrast. Multiplanar CT image reconstructions of the cervical spine and maxillofacial structures were also generated.  COMPARISON:  Head CT 06/18/2014 and CT cervical spine 02/07/2015  FINDINGS: CT HEAD FINDINGS  Ventricles, cisterns and other CSF spaces are normal. There is mild chronic ischemic microvascular disease. There is no mass, mass effect, shift of midline structures or acute hemorrhage. Minimal soft tissue swelling over the right supraorbital region. No evidence of fracture.  CT MAXILLOFACIAL FINDINGS  There is minimal right periorbital and supraorbital soft tissue swelling. Globes and retrobulbar spaces are normal and symmetric. Remaining soft tissues are unremarkable. There is no acute fracture. The visualized paranasal sinuses and mastoid air cells are clear. There are moderate degenerative changes of the temporomandibular joints bilaterally.  CT CERVICAL SPINE FINDINGS  Examination demonstrates mild spondylosis of the cervical spine. Again noted disc space narrowing with associated endplate Schmorl's nodes involving the C5-6 level. These findings appear chronic and are unchanged. There is very subtle 1-2  mm posterior subluxation of C-spine on C6 unchanged. There is no acute fracture or traumatic subluxation. Prevertebral soft tissues are normal. There is mild uncovertebral joint spurring. Remainder the exam is unchanged.  IMPRESSION: No acute intracranial findings.  Mild chronic ischemic microvascular disease.  Mild right periorbital/ supraorbital soft tissue swelling. No facial bone fracture.  No acute cervical spine injury.  Mild spondylosis of the cervical spine with stable chronic changes at the C5-6 level including disc space narrowing, Schmorl's node formation and slight posterior subluxation of C5 on C6.  Moderate degenerative changes of the temporomandibular joints.   Electronically Signed   By: Marin Olp M.D.   On: 02/11/2015 18:28   Dg Knee Complete 4 Views  Right 02/11/2015   CLINICAL DATA:  Right knee pain, bruising and abrasions after falling out of bed last night.  EXAM: RIGHT KNEE - COMPLETE 4+ VIEW  COMPARISON:  None.  FINDINGS: Severe lateral joint space narrowing with sclerosis and spur formation. Medial and patellofemoral spur formation is also demonstrated with marked patellofemoral joint space narrowing. There is moderate to marked medial joint space narrowing. A small to moderate effusion is noted. No fracture or dislocation is seen. Extensive atheromatous arterial calcifications.  IMPRESSION: 1. No fracture or dislocation is seen. 2. Marked degenerative changes. 3. Small to moderate-sized effusion. 4. Marked atheromatous arterial calcifications.   Electronically Signed   By: Claudie Revering M.D.   On: 02/11/2015 18:27   Dg Hand Complete Left 02/11/2015   CLINICAL DATA:  Bilateral hand pain after falling out of bed last night. Rheumatoid arthritis.  EXAM: LEFT HAND - COMPLETE 3+ VIEW  COMPARISON:  Right hand radiographs obtained at the same time.  FINDINGS: Anterior proximal dislocation of the little and ring fingers at the MCP joints, similar to that seen on the right. There are also ulnar and proximal dislocations of the index and middle fingers at the MCP joints. There is a metallic foreign body ventral to the second metacarpal. 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 Marked left wrist degenerative changes with marked joint space narrowing. Atheromatous arterial calcifications are noted. No acute fractures are seen.  IMPRESSION: 1. Dislocations of the second through fifth fingers, as described above. 2. Foreign body adjacent to the second metacarpal. 3. Severe left wrist degenerative changes. 4. Surgical absence of the distal ulna.   Electronically Signed   By: Claudie Revering M.D.   On: 02/11/2015 18:35   Dg Hand Complete Right 02/11/2015   CLINICAL DATA:  Bilateral hand pain after falling  out of bed last night. Rheumatoid arthritis.  EXAM: RIGHT HAND - COMPLETE 3+ VIEW  COMPARISON:  None.  FINDINGS: The examination is limited by the patient's inability to straighten his fingers. There is anterior and proximal dislocation of the little and ring fingers at the MCP joints with ventral angulation of the fingers. There is also a small metallic foreign body in the distal aspect of the little finger. Severe wrist degenerative changes are noted with marked joint space narrowing, bone fusion and extensive subarticular cyst formation as well as spur formation.  IMPRESSION: 1. Dislocations at the fourth and fifth MCP joints, as described above. 2. Distal little finger metallic foreign body. 3. Severe right wrist degenerative changes with bone fusion, osteophytes and subarticular cysts. These could represent changes due to combination of degenerative and rheumatoid arthritis.   Electronically Signed   By: Claudie Revering M.D.   On: 02/11/2015  18:31   Ct Maxillofacial Wo Cm 02/11/2015   CLINICAL DATA:  Pt states he fell off the bed last night and states "a couple of goose eggs and a little cut" to his head. Bruising to left eye and small abrasion/scabbed laceration above right eye. Also states headache last night after falling.  EXAM: CT HEAD WITHOUT CONTRAST  CT MAXILLOFACIAL WITHOUT CONTRAST  CT CERVICAL SPINE WITHOUT CONTRAST  TECHNIQUE: Multidetector CT imaging of the head, cervical spine, and maxillofacial structures were performed using the standard protocol without intravenous contrast. Multiplanar CT image reconstructions of the cervical spine and maxillofacial structures were also generated.  COMPARISON:  Head CT 06/18/2014 and CT cervical spine 02/07/2015  FINDINGS: CT HEAD FINDINGS  Ventricles, cisterns and other CSF spaces are normal. There is mild chronic ischemic microvascular disease. There is no mass, mass effect, shift of midline structures or acute hemorrhage. Minimal soft tissue swelling over  the right supraorbital region. No evidence of fracture.  CT MAXILLOFACIAL FINDINGS  There is minimal right periorbital and supraorbital soft tissue swelling. Globes and retrobulbar spaces are normal and symmetric. Remaining soft tissues are unremarkable. There is no acute fracture. The visualized paranasal sinuses and mastoid air cells are clear. There are moderate degenerative changes of the temporomandibular joints bilaterally.  CT CERVICAL SPINE FINDINGS  Examination demonstrates mild spondylosis of the cervical spine. Again noted disc space narrowing with associated endplate Schmorl's nodes involving the C5-6 level. These findings appear chronic and are unchanged. There is very subtle 1-2 mm posterior subluxation of C-spine on C6 unchanged. There is no acute fracture or traumatic subluxation. Prevertebral soft tissues are normal. There is mild uncovertebral joint spurring. Remainder the exam is unchanged.  IMPRESSION: No acute intracranial findings.  Mild chronic ischemic microvascular disease.  Mild right periorbital/ supraorbital soft tissue swelling. No facial bone fracture.  No acute cervical spine injury.  Mild spondylosis of the cervical spine with stable chronic changes at the C5-6 level including disc space narrowing, Schmorl's node formation and slight posterior subluxation of C5 on C6.  Moderate degenerative changes of the temporomandibular joints.   Electronically Signed   By: Marin Olp M.D.   On: 02/11/2015 18:28    2000:  Workup reassuring. Pt has gotten himself dressed and wants to go home now. Dx and testing d/w pt and family.  Questions answered.  Verb understanding, agreeable to d/c home with outpt f/u.   Francine Graven, DO 02/14/15 [phone]
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
## 2015-02-11 NOTE — Discharge Instructions (Signed)
°Emergency Department Resource Guide °1) Find a Doctor and Pay Out of Pocket °Although you won't have to find out who is covered by your insurance plan, it is a good idea to ask around and get recommendations. You will then need to call the office and see if the doctor you have chosen will accept you as a new patient and what types of options they offer for patients who are self-pay. Some doctors offer discounts or will set up payment plans for their patients who do not have insurance, but you will need to ask so you aren't surprised when you get to your appointment. ° °2) Contact Your Local Health Department °Not all health departments have doctors that can see patients for sick visits, but many do, so it is worth a call to see if yours does. If you don't know where your local health department is, you can check in your phone book. The CDC also has a tool to help you locate your state's health department, and many state websites also have listings of all of their local health departments. ° °3) Find a Walk-in Clinic °If your illness is not likely to be very severe or complicated, you may want to try a walk in clinic. These are popping up all over the country in pharmacies, drugstores, and shopping centers. They're usually staffed by nurse practitioners or physician assistants that have been trained to treat common illnesses and complaints. They're usually fairly quick and inexpensive. However, if you have serious medical issues or chronic medical problems, these are probably not your best option. ° °No Primary Care Doctor: °- Call [facility] at  832-8000 - they can help you locate a primary care doctor that  accepts your insurance, provides certain services, etc. °- Physician Referral Service- 1-800-533-3463 ° °Chronic Pain Problems: °Organization         Address  Phone   Notes  °[facility]  (336) 297-2271 Patients need to be referred by their primary care doctor.  ° °Medication  Assistance: °Organization         Address  Phone   Notes  °[place] [facility] [address] °[place], [place] [address] (336) 641-8030 --Must be a resident of [place] °-- Must have NO insurance coverage whatsoever (no Medicaid/ Medicare, etc.) °-- The pt. MUST have a primary care doctor that directs their care regularly and follows them in the community °  °[facility]  (866) 331-1348   °[employer]  (888) 892-1162   ° °Agencies that provide inexpensive medical care: °Organization         Address  Phone   Notes  °[facility]  (336) 832-8035   °[facility]    (336) 832-7272   °[facility] [address] °[place], [place] [address] (336) 832-4777   °[facility] [address], °[place] (336) 271-4999   °[facility]    (336) 373-0678   °[facility]    (336) 272-1050   °[facility] ° [address],  Phone:  (336) 832-4444, Fax:  (336) 832-4440 Hours of Operation:  9 am - 6 pm, M-F.  Also accepts Medicaid/Medicare and self-pay.  °[facility] ° [address],  Phone: (336) 832-3150, Fax: (336) 832-3151. Hours of Operation:  8:30 am - 5:30 pm, M-F.  Also accepts Medicaid and self-pay.  °[facility] [place] [address], [place] Phone: (336) 878-6027   °[facility] [address], [place], [place] (336)723-1848, Ext. 123 Mondays & Thursdays: 7-9 AM.  First 15 patients are seen on a first come, first serve basis. °  ° °Medicaid-accepting [place] Providers: ° °Organization         Address  Phone   Notes  °[facility] [address], [place] (336) 641-2100 Also accepts self-pay patients.  °[facility] [address], [place] ° (336) 856-9996   °[facility] [address], [place]  (336) 288-8857   °[facility] [address] [place] [address], [place] (336) 299-7000   °Veita Bland [address], [place]  ° (336) 373-1557 Only accepts [place] Access Medicaid patients after they have their name applied to their card.  ° °Self-Pay (no insurance) in [place]: ° °Organization         Address  Phone   Notes  °Sickle Cell Patients, [facility] [address], [place] (336) 832-1970   °[facility] [address], [place] (336) 832-4400   °[facility] ° [address], [place] (336) 992-4800   °[facility]/Dr. Osei-Bonsu ° [address] [place] [address], [place] or [address], [place] (336) 841-8500 Phone number for both [place] and [place] locations is the same.  °[facility] [address], [place] (336) 299-0000   °[facility] [address] [place] [address], [place] or [address] (336) 852-7530 °(336) 878-2260   °[facility] [address],  (336) 350-1642, phone; (336) 294-5005, fax Sees patients 1st and 3rd Saturday of every month.  Must not qualify for public or private insurance (i.e. Medicaid, Medicare, [place] Health Choice, Veterans' Benefits) • Household income should be no more than 200% of the poverty level •The clinic cannot treat you if you are pregnant or think you are pregnant • Sexually transmitted diseases are not treated at the clinic.  ° ° °Dental Care: °Organization         Address  Phone  Notes  °[place] [facility] [address],  (336) 641-6152 Accepts children up to age 21 who are enrolled in Medicaid or [place] Health Choice; pregnant women with a Medicaid card; and children who have applied for Medicaid or [place] Health Choice, but were declined, whose parents can pay a reduced fee at time of service.  °[place]  [facility] [place]  [address], [place] (336) 641-7733 Accepts children up to age 21 who are enrolled in Medicaid or [place] Health Choice; pregnant women with a Medicaid card; and children who have applied for Medicaid or [place] Health Choice, but were declined, whose parents can pay a reduced fee at time of service.  °[facility] ° [address],  (336) 641-4533 Patients are seen by appointment only. Walk-ins are not accepted. [facility] will see patients 18 years of age and older. °Monday - Tuesday (8am-5pm) °Most Wednesdays (8:30-5pm) °$30 per visit, cash only  °[facility] ° [address], [place] (336) 641-4533 Patients are seen by appointment only. Walk-ins are not accepted. [facility] will see patients 18 years of age and older. °One   Wednesday Evening (Monthly: Volunteer Based).  $30 per visit, cash only  °[facility]  (919) [phone] for adults; Children under age 4, call Graduate Pediatric Dentistry at ([phone]) [phone]. Children aged 4-14, please call (919) [phone] to request a pediatric application. ° Dental services are provided in all areas of dental care including fillings, crowns and bridges, complete and partial dentures, implants, gum treatment, root canals, and extractions. Preventive care is also provided. Treatment is provided to both adults and children. °Patients are selected via a lottery and there is often a waiting list. °  °[facility] [address], °[place] ° (336) 763-8833 [email or web address].[email or web address] °  °[facility] [address], [place], [place] (336)723-1848, Ext. 123 Second and Fourth Thursday of each month, opens at 6:30 AM; Clinic ends at 9 AM.  Patients are seen on a first-come first-served basis, and a limited number are seen during each clinic.  ° °[facility] ° [address], [place], [place] (336) 723-7904    Eligibility Requirements °You must have lived in [place], [place], or [place] counties for at least the last three months. °  You cannot be eligible for state or federal sponsored healthcare insurance, including Veterans Administration, Medicaid, or Medicare. °  You generally cannot be eligible for healthcare insurance through your employer.  °  How to apply: °Eligibility screenings are held every Tuesday and Wednesday afternoon from 1:00 pm until 4:00 pm. You do not need an appointment for the interview!  °[facility] [address], [place], [place] 336-631-2330   °[facility]  336-342-8273   °[facility]  336-703-3100   °[facility]  336-570-6415   ° °Behavioral Health Resources in the Community: °Intensive Outpatient Programs °Organization         Address  Phone  Notes  °[facility] [address], [place], [place] 336-878-6098   °[facility] [address], [place], [place] 336-832-9800   °[facility] [address], [place], [place] ° 336-882-2125   °[facility] [address],  °[place], [place] 1-800-853-5163 or 336-641-4981   °Substance Abuse Resources °Organization         Address  Phone  Notes  °[facility]  336-882-2125   °[facility]  336-784-9470   °[facility]  336-285-9073   °[facility]  336-845-3988   °Residential & Outpatient Substance Abuse Program  1-800-659-3381   °Psychological Services °Organization         Address  Phone  Notes  °[facility]  336- 832-9600   °[facility]  336- 378-7881   °[facility] [address], [place] 1-800-853-5163 or 336-641-4981   ° °Mobile Crisis Teams °Organization         Address  Phone  Notes  °[facility], Mobile Crisis Care Unit  1-877-626-1772   °Assertive °[facility] ° [address]  [place], [place] 336-834-9664   °Sharon DeEsch [address] °[place] 336-554-5454   ° °Self-Help/Support Groups °Organization         Address  Phone             Notes  °[facility] [place] - variety of support groups  336- 373-1402 Call for more information  °Narcotics Anonymous (NA), [facility] [address], °[place]   2 meetings at this location  ° °  Residential Treatment Programs Organization         Address  Phone  Notes  [facility] [address],    [place]  1-2031381443   [facility]  [address] [place] [id number], [place], [place]   [place], [place] (239)014-7661 Admissions: 8am-3pm M-F  [facility] [address],    [place], [place] [phone]   [facility] [address] [place], [place], [place]   [facility] [address],  [place], [place]   [facility] Intensive Outpatient [address], [place], [place]   [facility] [address]  [place], [place] 1-519-010-4824 or 614-585-1785   [facility] [address] [place], [place] Accepts Medicaid  [place] [facility] [address]  [place] 1-(854) 882-0822 Substance Abuse/Addiction Treatment   [facility]         Address  Phone  Notes  [facility]  636-204-7611   Domenic Schwab, PhD [address] [place], [place]   (713) 282-6608 or (726)500-1286   [place], [place] 8647137428   [facility] [address] [place], [place] [phone] Insurance/Medicaid/sponsorship through [facility] [address] [place]                                    [place], [place] 902-075-2131 [place] [address], [place] [phone]    Dr. Adele Schilder  [phone]   [facility] Dept[facility] 1) [address], [place] 2) [place] 3)  [place] [address], [place] ([phone]) [phone]  [phone]   [place] 940-304-0788 or [phone] (After Hours)      Take your usual prescriptions as previously directed.  Apply moist heat or ice to the area(s) of discomfort, for 15 minutes at a time, several times per day for the next few days.  Do not fall asleep on a heating or ice pack. Wash the abraded area with soap and water at least twice a day, and cover with a clean/dry dressing.  Change the dressing whenever it becomes wet or soiled after washing the area with soap and water.  Call your regular medical doctor tomorrow to schedule a follow up appointment in the next 2 days.  Return to the Emergency Department immediately if worsening.

## 2015-02-19 DIAGNOSIS — H16223 Keratoconjunctivitis sicca, not specified as Sjogren's, bilateral: Secondary | ICD-10-CM | POA: Diagnosis not present

## 2015-02-19 DIAGNOSIS — M0560 Rheumatoid arthritis of unspecified site with involvement of other organs and systems: Secondary | ICD-10-CM | POA: Diagnosis not present

## 2015-02-19 DIAGNOSIS — Z79899 Other long term (current) drug therapy: Secondary | ICD-10-CM | POA: Diagnosis not present

## 2015-02-19 DIAGNOSIS — E11329 Type 2 diabetes mellitus with mild nonproliferative diabetic retinopathy without macular edema: Secondary | ICD-10-CM | POA: Diagnosis not present

## 2015-05-12 DIAGNOSIS — M8580 Other specified disorders of bone density and structure, unspecified site: Secondary | ICD-10-CM | POA: Diagnosis not present

## 2015-05-12 DIAGNOSIS — M545 Low back pain: Secondary | ICD-10-CM | POA: Diagnosis not present

## 2015-05-12 DIAGNOSIS — M4310 Spondylolisthesis, site unspecified: Secondary | ICD-10-CM | POA: Diagnosis not present

## 2015-05-12 DIAGNOSIS — M5032 Other cervical disc degeneration, mid-cervical region: Secondary | ICD-10-CM | POA: Diagnosis not present

## 2015-05-12 DIAGNOSIS — T148 Other injury of unspecified body region: Secondary | ICD-10-CM | POA: Diagnosis not present

## 2015-05-12 DIAGNOSIS — M542 Cervicalgia: Secondary | ICD-10-CM | POA: Diagnosis not present

## 2015-05-12 DIAGNOSIS — G8929 Other chronic pain: Secondary | ICD-10-CM | POA: Diagnosis not present

## 2015-05-12 DIAGNOSIS — M0579 Rheumatoid arthritis with rheumatoid factor of multiple sites without organ or systems involvement: Secondary | ICD-10-CM | POA: Diagnosis not present

## 2015-05-12 DIAGNOSIS — M546 Pain in thoracic spine: Secondary | ICD-10-CM | POA: Diagnosis not present

## 2015-05-12 DIAGNOSIS — M810 Age-related osteoporosis without current pathological fracture: Secondary | ICD-10-CM | POA: Diagnosis not present

## 2015-05-12 DIAGNOSIS — M5136 Other intervertebral disc degeneration, lumbar region: Secondary | ICD-10-CM | POA: Diagnosis not present

## 2015-05-22 DIAGNOSIS — Z89421 Acquired absence of other right toe(s): Secondary | ICD-10-CM | POA: Diagnosis not present

## 2015-05-22 DIAGNOSIS — E1140 Type 2 diabetes mellitus with diabetic neuropathy, unspecified: Secondary | ICD-10-CM | POA: Diagnosis not present

## 2015-05-22 DIAGNOSIS — Z96642 Presence of left artificial hip joint: Secondary | ICD-10-CM | POA: Diagnosis not present

## 2015-05-22 DIAGNOSIS — E11319 Type 2 diabetes mellitus with unspecified diabetic retinopathy without macular edema: Secondary | ICD-10-CM | POA: Diagnosis not present

## 2015-05-22 DIAGNOSIS — I10 Essential (primary) hypertension: Secondary | ICD-10-CM | POA: Diagnosis not present

## 2015-05-22 DIAGNOSIS — L0390 Cellulitis, unspecified: Secondary | ICD-10-CM | POA: Diagnosis not present

## 2015-05-22 DIAGNOSIS — E780 Pure hypercholesterolemia: Secondary | ICD-10-CM | POA: Diagnosis not present

## 2015-06-15 DIAGNOSIS — Z79899 Other long term (current) drug therapy: Secondary | ICD-10-CM | POA: Diagnosis not present

## 2015-06-15 DIAGNOSIS — M069 Rheumatoid arthritis, unspecified: Secondary | ICD-10-CM | POA: Diagnosis not present

## 2015-06-15 DIAGNOSIS — E11321 Type 2 diabetes mellitus with mild nonproliferative diabetic retinopathy with macular edema: Secondary | ICD-10-CM | POA: Diagnosis not present

## 2015-06-15 DIAGNOSIS — H35033 Hypertensive retinopathy, bilateral: Secondary | ICD-10-CM | POA: Diagnosis not present

## 2015-07-06 DIAGNOSIS — I2510 Atherosclerotic heart disease of native coronary artery without angina pectoris: Secondary | ICD-10-CM | POA: Diagnosis not present

## 2015-07-06 DIAGNOSIS — R079 Chest pain, unspecified: Secondary | ICD-10-CM | POA: Diagnosis not present

## 2015-07-06 DIAGNOSIS — I252 Old myocardial infarction: Secondary | ICD-10-CM | POA: Diagnosis not present

## 2015-07-06 DIAGNOSIS — M069 Rheumatoid arthritis, unspecified: Secondary | ICD-10-CM | POA: Diagnosis not present

## 2015-07-06 DIAGNOSIS — I5021 Acute systolic (congestive) heart failure: Secondary | ICD-10-CM | POA: Diagnosis not present

## 2015-07-06 DIAGNOSIS — I9789 Other postprocedural complications and disorders of the circulatory system, not elsewhere classified: Secondary | ICD-10-CM | POA: Diagnosis not present

## 2015-07-06 DIAGNOSIS — Z794 Long term (current) use of insulin: Secondary | ICD-10-CM | POA: Diagnosis not present

## 2015-07-06 DIAGNOSIS — J929 Pleural plaque without asbestos: Secondary | ICD-10-CM | POA: Diagnosis not present

## 2015-07-06 DIAGNOSIS — I509 Heart failure, unspecified: Secondary | ICD-10-CM | POA: Diagnosis not present

## 2015-07-06 DIAGNOSIS — R7989 Other specified abnormal findings of blood chemistry: Secondary | ICD-10-CM | POA: Diagnosis not present

## 2015-07-06 DIAGNOSIS — J90 Pleural effusion, not elsewhere classified: Secondary | ICD-10-CM | POA: Diagnosis not present

## 2015-07-06 DIAGNOSIS — E871 Hypo-osmolality and hyponatremia: Secondary | ICD-10-CM | POA: Diagnosis not present

## 2015-07-07 DIAGNOSIS — I6522 Occlusion and stenosis of left carotid artery: Secondary | ICD-10-CM | POA: Diagnosis not present

## 2015-07-07 DIAGNOSIS — I4891 Unspecified atrial fibrillation: Secondary | ICD-10-CM | POA: Diagnosis not present

## 2015-07-07 DIAGNOSIS — J811 Chronic pulmonary edema: Secondary | ICD-10-CM | POA: Diagnosis not present

## 2015-07-07 DIAGNOSIS — I25118 Atherosclerotic heart disease of native coronary artery with other forms of angina pectoris: Secondary | ICD-10-CM | POA: Diagnosis not present

## 2015-07-07 DIAGNOSIS — E11319 Type 2 diabetes mellitus with unspecified diabetic retinopathy without macular edema: Secondary | ICD-10-CM | POA: Diagnosis present

## 2015-07-07 DIAGNOSIS — I5023 Acute on chronic systolic (congestive) heart failure: Secondary | ICD-10-CM | POA: Diagnosis not present

## 2015-07-07 DIAGNOSIS — E507 Other ocular manifestations of vitamin A deficiency: Secondary | ICD-10-CM | POA: Diagnosis present

## 2015-07-07 DIAGNOSIS — M7989 Other specified soft tissue disorders: Secondary | ICD-10-CM | POA: Diagnosis not present

## 2015-07-07 DIAGNOSIS — E780 Pure hypercholesterolemia: Secondary | ICD-10-CM | POA: Diagnosis not present

## 2015-07-07 DIAGNOSIS — E871 Hypo-osmolality and hyponatremia: Secondary | ICD-10-CM | POA: Diagnosis not present

## 2015-07-07 DIAGNOSIS — Z9889 Other specified postprocedural states: Secondary | ICD-10-CM | POA: Diagnosis not present

## 2015-07-07 DIAGNOSIS — Z4682 Encounter for fitting and adjustment of non-vascular catheter: Secondary | ICD-10-CM | POA: Diagnosis not present

## 2015-07-07 DIAGNOSIS — Z7952 Long term (current) use of systemic steroids: Secondary | ICD-10-CM | POA: Diagnosis not present

## 2015-07-07 DIAGNOSIS — M4312 Spondylolisthesis, cervical region: Secondary | ICD-10-CM | POA: Diagnosis not present

## 2015-07-07 DIAGNOSIS — I700 Atherosclerosis of aorta: Secondary | ICD-10-CM | POA: Diagnosis not present

## 2015-07-07 DIAGNOSIS — E782 Mixed hyperlipidemia: Secondary | ICD-10-CM | POA: Diagnosis present

## 2015-07-07 DIAGNOSIS — Z951 Presence of aortocoronary bypass graft: Secondary | ICD-10-CM | POA: Diagnosis not present

## 2015-07-07 DIAGNOSIS — K5900 Constipation, unspecified: Secondary | ICD-10-CM | POA: Diagnosis not present

## 2015-07-07 DIAGNOSIS — D638 Anemia in other chronic diseases classified elsewhere: Secondary | ICD-10-CM | POA: Diagnosis present

## 2015-07-07 DIAGNOSIS — I517 Cardiomegaly: Secondary | ICD-10-CM | POA: Diagnosis not present

## 2015-07-07 DIAGNOSIS — I2510 Atherosclerotic heart disease of native coronary artery without angina pectoris: Secondary | ICD-10-CM | POA: Diagnosis not present

## 2015-07-07 DIAGNOSIS — I5021 Acute systolic (congestive) heart failure: Secondary | ICD-10-CM | POA: Diagnosis present

## 2015-07-07 DIAGNOSIS — E119 Type 2 diabetes mellitus without complications: Secondary | ICD-10-CM | POA: Diagnosis not present

## 2015-07-07 DIAGNOSIS — Z7982 Long term (current) use of aspirin: Secondary | ICD-10-CM | POA: Diagnosis not present

## 2015-07-07 DIAGNOSIS — I9789 Other postprocedural complications and disorders of the circulatory system, not elsewhere classified: Secondary | ICD-10-CM | POA: Diagnosis not present

## 2015-07-07 DIAGNOSIS — J939 Pneumothorax, unspecified: Secondary | ICD-10-CM | POA: Diagnosis not present

## 2015-07-07 DIAGNOSIS — E1139 Type 2 diabetes mellitus with other diabetic ophthalmic complication: Secondary | ICD-10-CM | POA: Diagnosis not present

## 2015-07-07 DIAGNOSIS — M5032 Other cervical disc degeneration, mid-cervical region: Secondary | ICD-10-CM | POA: Diagnosis not present

## 2015-07-07 DIAGNOSIS — I25119 Atherosclerotic heart disease of native coronary artery with unspecified angina pectoris: Secondary | ICD-10-CM | POA: Diagnosis not present

## 2015-07-07 DIAGNOSIS — M4319 Spondylolisthesis, multiple sites in spine: Secondary | ICD-10-CM | POA: Diagnosis not present

## 2015-07-07 DIAGNOSIS — E8770 Fluid overload, unspecified: Secondary | ICD-10-CM | POA: Diagnosis present

## 2015-07-07 DIAGNOSIS — I5189 Other ill-defined heart diseases: Secondary | ICD-10-CM | POA: Diagnosis not present

## 2015-07-07 DIAGNOSIS — I10 Essential (primary) hypertension: Secondary | ICD-10-CM | POA: Diagnosis not present

## 2015-07-07 DIAGNOSIS — I5040 Unspecified combined systolic (congestive) and diastolic (congestive) heart failure: Secondary | ICD-10-CM | POA: Diagnosis not present

## 2015-07-07 DIAGNOSIS — Z794 Long term (current) use of insulin: Secondary | ICD-10-CM | POA: Diagnosis not present

## 2015-07-07 DIAGNOSIS — R0789 Other chest pain: Secondary | ICD-10-CM | POA: Diagnosis not present

## 2015-07-07 DIAGNOSIS — I5020 Unspecified systolic (congestive) heart failure: Secondary | ICD-10-CM | POA: Diagnosis not present

## 2015-07-07 DIAGNOSIS — J9811 Atelectasis: Secondary | ICD-10-CM | POA: Diagnosis not present

## 2015-07-07 DIAGNOSIS — J90 Pleural effusion, not elsewhere classified: Secondary | ICD-10-CM | POA: Diagnosis not present

## 2015-07-07 DIAGNOSIS — R079 Chest pain, unspecified: Secondary | ICD-10-CM | POA: Diagnosis not present

## 2015-07-07 DIAGNOSIS — I259 Chronic ischemic heart disease, unspecified: Secondary | ICD-10-CM | POA: Diagnosis not present

## 2015-07-07 DIAGNOSIS — N400 Enlarged prostate without lower urinary tract symptoms: Secondary | ICD-10-CM | POA: Diagnosis present

## 2015-07-07 DIAGNOSIS — Z882 Allergy status to sulfonamides status: Secondary | ICD-10-CM | POA: Diagnosis not present

## 2015-07-07 DIAGNOSIS — M069 Rheumatoid arthritis, unspecified: Secondary | ICD-10-CM | POA: Diagnosis not present

## 2015-07-07 DIAGNOSIS — E1140 Type 2 diabetes mellitus with diabetic neuropathy, unspecified: Secondary | ICD-10-CM | POA: Diagnosis present

## 2015-07-07 DIAGNOSIS — E11311 Type 2 diabetes mellitus with unspecified diabetic retinopathy with macular edema: Secondary | ICD-10-CM | POA: Diagnosis present

## 2015-07-07 DIAGNOSIS — I509 Heart failure, unspecified: Secondary | ICD-10-CM | POA: Diagnosis not present

## 2015-07-07 DIAGNOSIS — I209 Angina pectoris, unspecified: Secondary | ICD-10-CM | POA: Diagnosis not present

## 2015-07-07 DIAGNOSIS — K5909 Other constipation: Secondary | ICD-10-CM | POA: Diagnosis present

## 2015-07-07 DIAGNOSIS — I252 Old myocardial infarction: Secondary | ICD-10-CM | POA: Diagnosis not present

## 2015-07-07 DIAGNOSIS — I255 Ischemic cardiomyopathy: Secondary | ICD-10-CM | POA: Diagnosis not present

## 2015-07-07 DIAGNOSIS — E1151 Type 2 diabetes mellitus with diabetic peripheral angiopathy without gangrene: Secondary | ICD-10-CM | POA: Diagnosis present

## 2015-07-09 DIAGNOSIS — I5020 Unspecified systolic (congestive) heart failure: Secondary | ICD-10-CM | POA: Insufficient documentation

## 2015-07-17 DIAGNOSIS — I259 Chronic ischemic heart disease, unspecified: Secondary | ICD-10-CM | POA: Diagnosis not present

## 2015-08-06 DIAGNOSIS — Z951 Presence of aortocoronary bypass graft: Secondary | ICD-10-CM | POA: Diagnosis not present

## 2015-08-06 DIAGNOSIS — I2510 Atherosclerotic heart disease of native coronary artery without angina pectoris: Secondary | ICD-10-CM | POA: Diagnosis not present

## 2015-08-06 DIAGNOSIS — Z955 Presence of coronary angioplasty implant and graft: Secondary | ICD-10-CM | POA: Diagnosis not present

## 2015-08-06 DIAGNOSIS — Z48812 Encounter for surgical aftercare following surgery on the circulatory system: Secondary | ICD-10-CM | POA: Diagnosis not present

## 2015-08-06 DIAGNOSIS — R710 Precipitous drop in hematocrit: Secondary | ICD-10-CM | POA: Diagnosis not present

## 2015-08-13 DIAGNOSIS — I4891 Unspecified atrial fibrillation: Secondary | ICD-10-CM | POA: Diagnosis not present

## 2015-08-13 DIAGNOSIS — I2510 Atherosclerotic heart disease of native coronary artery without angina pectoris: Secondary | ICD-10-CM | POA: Diagnosis not present

## 2015-08-13 DIAGNOSIS — Z951 Presence of aortocoronary bypass graft: Secondary | ICD-10-CM | POA: Diagnosis not present

## 2015-08-13 DIAGNOSIS — R7989 Other specified abnormal findings of blood chemistry: Secondary | ICD-10-CM | POA: Diagnosis not present

## 2015-08-13 DIAGNOSIS — Z79899 Other long term (current) drug therapy: Secondary | ICD-10-CM | POA: Diagnosis not present

## 2015-09-21 DIAGNOSIS — M19011 Primary osteoarthritis, right shoulder: Secondary | ICD-10-CM | POA: Diagnosis not present

## 2015-09-21 DIAGNOSIS — W19XXXA Unspecified fall, initial encounter: Secondary | ICD-10-CM | POA: Diagnosis not present

## 2015-09-21 DIAGNOSIS — I509 Heart failure, unspecified: Secondary | ICD-10-CM | POA: Diagnosis not present

## 2015-09-21 DIAGNOSIS — R0789 Other chest pain: Secondary | ICD-10-CM | POA: Diagnosis not present

## 2015-09-21 DIAGNOSIS — N179 Acute kidney failure, unspecified: Secondary | ICD-10-CM | POA: Diagnosis not present

## 2015-09-21 DIAGNOSIS — E11319 Type 2 diabetes mellitus with unspecified diabetic retinopathy without macular edema: Secondary | ICD-10-CM | POA: Diagnosis not present

## 2015-09-21 DIAGNOSIS — I2510 Atherosclerotic heart disease of native coronary artery without angina pectoris: Secondary | ICD-10-CM | POA: Diagnosis not present

## 2015-09-21 DIAGNOSIS — G629 Polyneuropathy, unspecified: Secondary | ICD-10-CM | POA: Diagnosis not present

## 2015-09-21 DIAGNOSIS — D61818 Other pancytopenia: Secondary | ICD-10-CM | POA: Diagnosis not present

## 2015-09-21 DIAGNOSIS — E780 Pure hypercholesterolemia: Secondary | ICD-10-CM | POA: Diagnosis not present

## 2015-09-21 DIAGNOSIS — M869 Osteomyelitis, unspecified: Secondary | ICD-10-CM | POA: Diagnosis not present

## 2015-09-21 DIAGNOSIS — I129 Hypertensive chronic kidney disease with stage 1 through stage 4 chronic kidney disease, or unspecified chronic kidney disease: Secondary | ICD-10-CM | POA: Diagnosis not present

## 2015-09-21 DIAGNOSIS — Z043 Encounter for examination and observation following other accident: Secondary | ICD-10-CM | POA: Diagnosis not present

## 2015-09-21 DIAGNOSIS — E1140 Type 2 diabetes mellitus with diabetic neuropathy, unspecified: Secondary | ICD-10-CM | POA: Diagnosis not present

## 2015-09-21 DIAGNOSIS — Z7982 Long term (current) use of aspirin: Secondary | ICD-10-CM | POA: Diagnosis not present

## 2015-09-21 DIAGNOSIS — M19012 Primary osteoarthritis, left shoulder: Secondary | ICD-10-CM | POA: Diagnosis not present

## 2015-09-21 DIAGNOSIS — Z0389 Encounter for observation for other suspected diseases and conditions ruled out: Secondary | ICD-10-CM | POA: Diagnosis not present

## 2015-09-21 DIAGNOSIS — M47812 Spondylosis without myelopathy or radiculopathy, cervical region: Secondary | ICD-10-CM | POA: Diagnosis not present

## 2015-09-21 DIAGNOSIS — M069 Rheumatoid arthritis, unspecified: Secondary | ICD-10-CM | POA: Diagnosis not present

## 2015-09-21 DIAGNOSIS — I4891 Unspecified atrial fibrillation: Secondary | ICD-10-CM | POA: Diagnosis not present

## 2015-09-21 DIAGNOSIS — Z951 Presence of aortocoronary bypass graft: Secondary | ICD-10-CM | POA: Diagnosis not present

## 2015-09-21 DIAGNOSIS — I252 Old myocardial infarction: Secondary | ICD-10-CM | POA: Diagnosis not present

## 2015-09-21 DIAGNOSIS — Z794 Long term (current) use of insulin: Secondary | ICD-10-CM | POA: Diagnosis not present

## 2015-09-21 DIAGNOSIS — N183 Chronic kidney disease, stage 3 (moderate): Secondary | ICD-10-CM | POA: Diagnosis not present

## 2015-09-21 DIAGNOSIS — Z96642 Presence of left artificial hip joint: Secondary | ICD-10-CM | POA: Diagnosis not present

## 2015-10-15 DIAGNOSIS — R079 Chest pain, unspecified: Secondary | ICD-10-CM | POA: Diagnosis not present

## 2015-10-15 DIAGNOSIS — I340 Nonrheumatic mitral (valve) insufficiency: Secondary | ICD-10-CM | POA: Diagnosis not present

## 2015-10-15 DIAGNOSIS — Z7982 Long term (current) use of aspirin: Secondary | ICD-10-CM | POA: Diagnosis not present

## 2015-10-15 DIAGNOSIS — I255 Ischemic cardiomyopathy: Secondary | ICD-10-CM | POA: Diagnosis not present

## 2015-10-15 DIAGNOSIS — Z951 Presence of aortocoronary bypass graft: Secondary | ICD-10-CM | POA: Diagnosis not present

## 2015-10-15 DIAGNOSIS — I2510 Atherosclerotic heart disease of native coronary artery without angina pectoris: Secondary | ICD-10-CM | POA: Diagnosis not present

## 2015-10-15 DIAGNOSIS — I5020 Unspecified systolic (congestive) heart failure: Secondary | ICD-10-CM | POA: Diagnosis not present

## 2015-10-15 DIAGNOSIS — I4891 Unspecified atrial fibrillation: Secondary | ICD-10-CM | POA: Diagnosis not present

## 2015-10-19 DIAGNOSIS — H04123 Dry eye syndrome of bilateral lacrimal glands: Secondary | ICD-10-CM | POA: Diagnosis not present

## 2015-10-19 DIAGNOSIS — H35353 Cystoid macular degeneration, bilateral: Secondary | ICD-10-CM | POA: Diagnosis not present

## 2015-10-19 DIAGNOSIS — H35033 Hypertensive retinopathy, bilateral: Secondary | ICD-10-CM | POA: Diagnosis not present

## 2015-10-19 DIAGNOSIS — Z79899 Other long term (current) drug therapy: Secondary | ICD-10-CM | POA: Diagnosis not present

## 2015-10-19 DIAGNOSIS — M069 Rheumatoid arthritis, unspecified: Secondary | ICD-10-CM | POA: Diagnosis not present

## 2015-10-19 DIAGNOSIS — H2513 Age-related nuclear cataract, bilateral: Secondary | ICD-10-CM | POA: Diagnosis not present

## 2015-10-19 DIAGNOSIS — E113213 Type 2 diabetes mellitus with mild nonproliferative diabetic retinopathy with macular edema, bilateral: Secondary | ICD-10-CM | POA: Diagnosis not present

## 2015-10-23 DIAGNOSIS — Z79891 Long term (current) use of opiate analgesic: Secondary | ICD-10-CM | POA: Diagnosis not present

## 2015-10-23 DIAGNOSIS — E119 Type 2 diabetes mellitus without complications: Secondary | ICD-10-CM | POA: Diagnosis not present

## 2015-10-23 DIAGNOSIS — I6523 Occlusion and stenosis of bilateral carotid arteries: Secondary | ICD-10-CM | POA: Diagnosis present

## 2015-10-23 DIAGNOSIS — I252 Old myocardial infarction: Secondary | ICD-10-CM | POA: Diagnosis not present

## 2015-10-23 DIAGNOSIS — I130 Hypertensive heart and chronic kidney disease with heart failure and stage 1 through stage 4 chronic kidney disease, or unspecified chronic kidney disease: Secondary | ICD-10-CM | POA: Diagnosis present

## 2015-10-23 DIAGNOSIS — N400 Enlarged prostate without lower urinary tract symptoms: Secondary | ICD-10-CM | POA: Diagnosis not present

## 2015-10-23 DIAGNOSIS — R600 Localized edema: Secondary | ICD-10-CM | POA: Diagnosis not present

## 2015-10-23 DIAGNOSIS — Z951 Presence of aortocoronary bypass graft: Secondary | ICD-10-CM | POA: Diagnosis not present

## 2015-10-23 DIAGNOSIS — J90 Pleural effusion, not elsewhere classified: Secondary | ICD-10-CM | POA: Diagnosis not present

## 2015-10-23 DIAGNOSIS — Z7952 Long term (current) use of systemic steroids: Secondary | ICD-10-CM | POA: Diagnosis not present

## 2015-10-23 DIAGNOSIS — I700 Atherosclerosis of aorta: Secondary | ICD-10-CM | POA: Diagnosis present

## 2015-10-23 DIAGNOSIS — S161XXA Strain of muscle, fascia and tendon at neck level, initial encounter: Secondary | ICD-10-CM | POA: Diagnosis present

## 2015-10-23 DIAGNOSIS — M50222 Other cervical disc displacement at C5-C6 level: Secondary | ICD-10-CM | POA: Diagnosis not present

## 2015-10-23 DIAGNOSIS — Z794 Long term (current) use of insulin: Secondary | ICD-10-CM | POA: Diagnosis not present

## 2015-10-23 DIAGNOSIS — N182 Chronic kidney disease, stage 2 (mild): Secondary | ICD-10-CM | POA: Diagnosis present

## 2015-10-23 DIAGNOSIS — E1140 Type 2 diabetes mellitus with diabetic neuropathy, unspecified: Secondary | ICD-10-CM | POA: Diagnosis present

## 2015-10-23 DIAGNOSIS — Z7982 Long term (current) use of aspirin: Secondary | ICD-10-CM | POA: Diagnosis not present

## 2015-10-23 DIAGNOSIS — I509 Heart failure, unspecified: Secondary | ICD-10-CM | POA: Diagnosis not present

## 2015-10-23 DIAGNOSIS — E11311 Type 2 diabetes mellitus with unspecified diabetic retinopathy with macular edema: Secondary | ICD-10-CM | POA: Diagnosis present

## 2015-10-23 DIAGNOSIS — Z96642 Presence of left artificial hip joint: Secondary | ICD-10-CM | POA: Diagnosis present

## 2015-10-23 DIAGNOSIS — M069 Rheumatoid arthritis, unspecified: Secondary | ICD-10-CM | POA: Diagnosis not present

## 2015-10-23 DIAGNOSIS — I5023 Acute on chronic systolic (congestive) heart failure: Secondary | ICD-10-CM | POA: Diagnosis not present

## 2015-10-23 DIAGNOSIS — R05 Cough: Secondary | ICD-10-CM | POA: Diagnosis not present

## 2015-10-23 DIAGNOSIS — E7800 Pure hypercholesterolemia, unspecified: Secondary | ICD-10-CM | POA: Diagnosis present

## 2015-10-23 DIAGNOSIS — Z9861 Coronary angioplasty status: Secondary | ICD-10-CM | POA: Diagnosis not present

## 2015-10-23 DIAGNOSIS — I2510 Atherosclerotic heart disease of native coronary artery without angina pectoris: Secondary | ICD-10-CM | POA: Diagnosis not present

## 2015-10-23 DIAGNOSIS — I4891 Unspecified atrial fibrillation: Secondary | ICD-10-CM | POA: Diagnosis not present

## 2015-10-23 DIAGNOSIS — R0982 Postnasal drip: Secondary | ICD-10-CM | POA: Diagnosis present

## 2015-10-23 DIAGNOSIS — R0989 Other specified symptoms and signs involving the circulatory and respiratory systems: Secondary | ICD-10-CM | POA: Diagnosis not present

## 2015-10-23 DIAGNOSIS — M5030 Other cervical disc degeneration, unspecified cervical region: Secondary | ICD-10-CM | POA: Diagnosis not present

## 2015-10-23 DIAGNOSIS — E1169 Type 2 diabetes mellitus with other specified complication: Secondary | ICD-10-CM | POA: Diagnosis not present

## 2015-10-23 DIAGNOSIS — J9811 Atelectasis: Secondary | ICD-10-CM | POA: Diagnosis not present

## 2015-10-23 DIAGNOSIS — Z89421 Acquired absence of other right toe(s): Secondary | ICD-10-CM | POA: Diagnosis not present

## 2015-11-03 DIAGNOSIS — M0579 Rheumatoid arthritis with rheumatoid factor of multiple sites without organ or systems involvement: Secondary | ICD-10-CM | POA: Diagnosis not present

## 2015-11-16 DIAGNOSIS — I5023 Acute on chronic systolic (congestive) heart failure: Secondary | ICD-10-CM | POA: Diagnosis not present

## 2015-11-16 DIAGNOSIS — E785 Hyperlipidemia, unspecified: Secondary | ICD-10-CM | POA: Diagnosis not present

## 2015-11-16 DIAGNOSIS — Z951 Presence of aortocoronary bypass graft: Secondary | ICD-10-CM | POA: Diagnosis not present

## 2015-11-16 DIAGNOSIS — I252 Old myocardial infarction: Secondary | ICD-10-CM | POA: Diagnosis not present

## 2015-11-16 DIAGNOSIS — E11319 Type 2 diabetes mellitus with unspecified diabetic retinopathy without macular edema: Secondary | ICD-10-CM | POA: Diagnosis not present

## 2015-11-16 DIAGNOSIS — I110 Hypertensive heart disease with heart failure: Secondary | ICD-10-CM | POA: Diagnosis not present

## 2015-11-16 DIAGNOSIS — M069 Rheumatoid arthritis, unspecified: Secondary | ICD-10-CM | POA: Diagnosis not present

## 2015-11-16 DIAGNOSIS — I4891 Unspecified atrial fibrillation: Secondary | ICD-10-CM | POA: Diagnosis not present

## 2015-11-16 DIAGNOSIS — H04123 Dry eye syndrome of bilateral lacrimal glands: Secondary | ICD-10-CM | POA: Diagnosis not present

## 2015-11-16 DIAGNOSIS — I2510 Atherosclerotic heart disease of native coronary artery without angina pectoris: Secondary | ICD-10-CM | POA: Diagnosis not present

## 2015-11-16 DIAGNOSIS — I5020 Unspecified systolic (congestive) heart failure: Secondary | ICD-10-CM | POA: Diagnosis not present

## 2015-11-16 DIAGNOSIS — I700 Atherosclerosis of aorta: Secondary | ICD-10-CM | POA: Diagnosis not present

## 2015-11-16 DIAGNOSIS — Z96642 Presence of left artificial hip joint: Secondary | ICD-10-CM | POA: Diagnosis not present

## 2015-11-16 DIAGNOSIS — I255 Ischemic cardiomyopathy: Secondary | ICD-10-CM | POA: Diagnosis not present

## 2015-11-16 DIAGNOSIS — Z794 Long term (current) use of insulin: Secondary | ICD-10-CM | POA: Diagnosis not present

## 2015-11-16 DIAGNOSIS — Z7982 Long term (current) use of aspirin: Secondary | ICD-10-CM | POA: Diagnosis not present

## 2015-11-16 DIAGNOSIS — I10 Essential (primary) hypertension: Secondary | ICD-10-CM | POA: Diagnosis not present

## 2015-11-16 DIAGNOSIS — E1140 Type 2 diabetes mellitus with diabetic neuropathy, unspecified: Secondary | ICD-10-CM | POA: Diagnosis not present

## 2015-11-16 DIAGNOSIS — E7800 Pure hypercholesterolemia, unspecified: Secondary | ICD-10-CM | POA: Diagnosis not present

## 2015-11-16 DIAGNOSIS — Z79899 Other long term (current) drug therapy: Secondary | ICD-10-CM | POA: Diagnosis not present

## 2015-11-17 DIAGNOSIS — E11311 Type 2 diabetes mellitus with unspecified diabetic retinopathy with macular edema: Secondary | ICD-10-CM | POA: Diagnosis not present

## 2015-11-17 DIAGNOSIS — Z7982 Long term (current) use of aspirin: Secondary | ICD-10-CM | POA: Diagnosis not present

## 2015-11-17 DIAGNOSIS — I10 Essential (primary) hypertension: Secondary | ICD-10-CM | POA: Diagnosis not present

## 2015-11-17 DIAGNOSIS — Z79899 Other long term (current) drug therapy: Secondary | ICD-10-CM | POA: Diagnosis not present

## 2015-11-17 DIAGNOSIS — L03032 Cellulitis of left toe: Secondary | ICD-10-CM | POA: Diagnosis not present

## 2015-11-17 DIAGNOSIS — E1140 Type 2 diabetes mellitus with diabetic neuropathy, unspecified: Secondary | ICD-10-CM | POA: Diagnosis not present

## 2015-11-17 DIAGNOSIS — E7800 Pure hypercholesterolemia, unspecified: Secondary | ICD-10-CM | POA: Diagnosis not present

## 2015-11-17 DIAGNOSIS — I252 Old myocardial infarction: Secondary | ICD-10-CM | POA: Diagnosis not present

## 2015-11-17 DIAGNOSIS — M19072 Primary osteoarthritis, left ankle and foot: Secondary | ICD-10-CM | POA: Diagnosis not present

## 2015-11-17 DIAGNOSIS — Z951 Presence of aortocoronary bypass graft: Secondary | ICD-10-CM | POA: Diagnosis not present

## 2015-11-17 DIAGNOSIS — I2510 Atherosclerotic heart disease of native coronary artery without angina pectoris: Secondary | ICD-10-CM | POA: Diagnosis not present

## 2015-11-17 DIAGNOSIS — S93145A Subluxation of metatarsophalangeal joint of left lesser toe(s), initial encounter: Secondary | ICD-10-CM | POA: Diagnosis not present

## 2015-12-03 DIAGNOSIS — E7800 Pure hypercholesterolemia, unspecified: Secondary | ICD-10-CM | POA: Diagnosis not present

## 2015-12-03 DIAGNOSIS — E1136 Type 2 diabetes mellitus with diabetic cataract: Secondary | ICD-10-CM | POA: Diagnosis not present

## 2015-12-03 DIAGNOSIS — Z951 Presence of aortocoronary bypass graft: Secondary | ICD-10-CM | POA: Diagnosis not present

## 2015-12-03 DIAGNOSIS — Z96642 Presence of left artificial hip joint: Secondary | ICD-10-CM | POA: Diagnosis not present

## 2015-12-03 DIAGNOSIS — S93145A Subluxation of metatarsophalangeal joint of left lesser toe(s), initial encounter: Secondary | ICD-10-CM | POA: Diagnosis not present

## 2015-12-03 DIAGNOSIS — N183 Chronic kidney disease, stage 3 (moderate): Secondary | ICD-10-CM | POA: Diagnosis not present

## 2015-12-03 DIAGNOSIS — E11319 Type 2 diabetes mellitus with unspecified diabetic retinopathy without macular edema: Secondary | ICD-10-CM | POA: Diagnosis not present

## 2015-12-03 DIAGNOSIS — E11621 Type 2 diabetes mellitus with foot ulcer: Secondary | ICD-10-CM | POA: Diagnosis not present

## 2015-12-03 DIAGNOSIS — I129 Hypertensive chronic kidney disease with stage 1 through stage 4 chronic kidney disease, or unspecified chronic kidney disease: Secondary | ICD-10-CM | POA: Diagnosis not present

## 2015-12-03 DIAGNOSIS — I252 Old myocardial infarction: Secondary | ICD-10-CM | POA: Diagnosis not present

## 2015-12-03 DIAGNOSIS — R079 Chest pain, unspecified: Secondary | ICD-10-CM | POA: Diagnosis not present

## 2015-12-03 DIAGNOSIS — R0789 Other chest pain: Secondary | ICD-10-CM | POA: Diagnosis not present

## 2015-12-03 DIAGNOSIS — R0989 Other specified symptoms and signs involving the circulatory and respiratory systems: Secondary | ICD-10-CM | POA: Diagnosis not present

## 2015-12-03 DIAGNOSIS — M069 Rheumatoid arthritis, unspecified: Secondary | ICD-10-CM | POA: Diagnosis not present

## 2015-12-03 DIAGNOSIS — M19072 Primary osteoarthritis, left ankle and foot: Secondary | ICD-10-CM | POA: Diagnosis not present

## 2015-12-03 DIAGNOSIS — I2510 Atherosclerotic heart disease of native coronary artery without angina pectoris: Secondary | ICD-10-CM | POA: Diagnosis not present

## 2015-12-03 DIAGNOSIS — Z89429 Acquired absence of other toe(s), unspecified side: Secondary | ICD-10-CM | POA: Diagnosis not present

## 2015-12-03 DIAGNOSIS — L97529 Non-pressure chronic ulcer of other part of left foot with unspecified severity: Secondary | ICD-10-CM | POA: Diagnosis not present

## 2015-12-03 DIAGNOSIS — Z79899 Other long term (current) drug therapy: Secondary | ICD-10-CM | POA: Diagnosis not present

## 2015-12-03 DIAGNOSIS — I4891 Unspecified atrial fibrillation: Secondary | ICD-10-CM | POA: Diagnosis not present

## 2015-12-03 DIAGNOSIS — E875 Hyperkalemia: Secondary | ICD-10-CM | POA: Diagnosis not present

## 2015-12-03 DIAGNOSIS — I5022 Chronic systolic (congestive) heart failure: Secondary | ICD-10-CM | POA: Diagnosis not present

## 2015-12-03 DIAGNOSIS — Z7952 Long term (current) use of systemic steroids: Secondary | ICD-10-CM | POA: Diagnosis not present

## 2015-12-03 DIAGNOSIS — Z882 Allergy status to sulfonamides status: Secondary | ICD-10-CM | POA: Diagnosis not present

## 2015-12-03 DIAGNOSIS — E1140 Type 2 diabetes mellitus with diabetic neuropathy, unspecified: Secondary | ICD-10-CM | POA: Diagnosis not present

## 2015-12-03 DIAGNOSIS — Z7982 Long term (current) use of aspirin: Secondary | ICD-10-CM | POA: Diagnosis not present

## 2015-12-03 DIAGNOSIS — R9431 Abnormal electrocardiogram [ECG] [EKG]: Secondary | ICD-10-CM | POA: Diagnosis not present

## 2015-12-03 DIAGNOSIS — M2040 Other hammer toe(s) (acquired), unspecified foot: Secondary | ICD-10-CM | POA: Diagnosis not present

## 2015-12-03 DIAGNOSIS — M19079 Primary osteoarthritis, unspecified ankle and foot: Secondary | ICD-10-CM | POA: Diagnosis not present

## 2015-12-03 DIAGNOSIS — M869 Osteomyelitis, unspecified: Secondary | ICD-10-CM | POA: Diagnosis not present

## 2015-12-03 DIAGNOSIS — H3581 Retinal edema: Secondary | ICD-10-CM | POA: Diagnosis not present

## 2015-12-04 ENCOUNTER — Encounter (HOSPITAL_COMMUNITY): Payer: Self-pay | Admitting: Emergency Medicine

## 2015-12-04 ENCOUNTER — Emergency Department (HOSPITAL_COMMUNITY)
Admission: EM | Admit: 2015-12-04 | Discharge: 2015-12-04 | Disposition: A | Discharge status: Home / Self Care | Payer: Medicare Other | Attending: Emergency Medicine | Admitting: Emergency Medicine

## 2015-12-04 DIAGNOSIS — Z79891 Long term (current) use of opiate analgesic: Secondary | ICD-10-CM | POA: Insufficient documentation

## 2015-12-04 DIAGNOSIS — E875 Hyperkalemia: Secondary | ICD-10-CM | POA: Diagnosis not present

## 2015-12-04 DIAGNOSIS — Z79899 Other long term (current) drug therapy: Secondary | ICD-10-CM | POA: Insufficient documentation

## 2015-12-04 DIAGNOSIS — M069 Rheumatoid arthritis, unspecified: Secondary | ICD-10-CM | POA: Insufficient documentation

## 2015-12-04 DIAGNOSIS — Z7952 Long term (current) use of systemic steroids: Secondary | ICD-10-CM | POA: Insufficient documentation

## 2015-12-04 DIAGNOSIS — Z7982 Long term (current) use of aspirin: Secondary | ICD-10-CM | POA: Insufficient documentation

## 2015-12-04 DIAGNOSIS — E1140 Type 2 diabetes mellitus with diabetic neuropathy, unspecified: Secondary | ICD-10-CM | POA: Insufficient documentation

## 2015-12-04 DIAGNOSIS — R7989 Other specified abnormal findings of blood chemistry: Secondary | ICD-10-CM | POA: Diagnosis present

## 2015-12-04 DIAGNOSIS — Z862 Personal history of diseases of the blood and blood-forming organs and certain disorders involving the immune mechanism: Secondary | ICD-10-CM | POA: Insufficient documentation

## 2015-12-04 DIAGNOSIS — G8929 Other chronic pain: Secondary | ICD-10-CM | POA: Diagnosis not present

## 2015-12-04 DIAGNOSIS — I10 Essential (primary) hypertension: Secondary | ICD-10-CM | POA: Diagnosis not present

## 2015-12-04 DIAGNOSIS — I2510 Atherosclerotic heart disease of native coronary artery without angina pectoris: Secondary | ICD-10-CM | POA: Diagnosis not present

## 2015-12-04 DIAGNOSIS — Z794 Long term (current) use of insulin: Secondary | ICD-10-CM | POA: Diagnosis not present

## 2015-12-04 DIAGNOSIS — I4581 Long QT syndrome: Secondary | ICD-10-CM | POA: Diagnosis not present

## 2015-12-04 HISTORY — DX: Hyperkalemia: E87.5

## 2015-12-04 LAB — BASIC METABOLIC PANEL
Anion gap: 9 (ref 5–15)
BUN: 29 mg/dL — ABNORMAL HIGH (ref 6–20)
CO2: 28 mmol/L (ref 22–32)
Calcium: 8.7 mg/dL — ABNORMAL LOW (ref 8.9–10.3)
Chloride: 96 mmol/L — ABNORMAL LOW (ref 101–111)
Creatinine, Ser: 1.48 mg/dL — ABNORMAL HIGH (ref 0.61–1.24)
GFR calc Af Amer: 55 mL/min — ABNORMAL LOW (ref >60)
GFR calc non Af Amer: 48 mL/min — ABNORMAL LOW (ref >60)
Glucose, Bld: 349 mg/dL — ABNORMAL HIGH (ref 65–99)
Potassium: 5.5 mmol/L — ABNORMAL HIGH (ref 3.5–5.1)
Sodium: 133 mmol/L — ABNORMAL LOW (ref 135–145)

## 2015-12-04 MED ORDER — SODIUM POLYSTYRENE SULFONATE 15 GM/60ML PO SUSP
30.0000 g | Freq: Once | ORAL | Status: AC
  Administered 2015-12-04: 30 g via ORAL
  Filled 2015-12-04: qty 120

## 2015-12-04 NOTE — ED Provider Notes (Signed)
CSN: [id number]     Arrival date & time 12/04/15  1514 History   First MD Initiated Contact with Patient 12/04/15 1535     Chief Complaint  Patient presents with  . Abnormal Lab     (Consider location/radiation/quality/duration/timing/severity/associated sxs/prior Treatment) HPI   64yM presenting for repeat blood work. Just seen at outside ED yesterday and discharged. Noted to be hyperkalemic at 5.6. Given kayexalate and advised to have repeat blood work today. Went to urgent care to do this and referred to ED. He has no new complaints since last seen. No dizziness, lightheadedness or sob. Reports compliance with medications. Does not take potassium supplementation. Not on aldactone.   Past Medical History  Diagnosis Date  . Rheumatoid arthritis(714.0)   . Coronary artery disease 1993    angioplasty after AMI  . Diabetes mellitus   . Hypertension   . Diabetic neuropathy ([place])   . 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  . Pancytopenia ([place]) 08/13/2014  . Seizure ([place]) 08/13/2014    Pt denies  . Chronic neck pain   . Chronic back pain   . Hyperkalemia    Past Surgical History  Procedure Laterality Date  . Joint replacement    . Fracture surgery    . Cardiac surgery     Family History  Problem Relation Age of Onset  . Diabetes Father   . Dementia Father   . Heart attack Brother     multiple brothers with MIs  . Hypertension Mother   . Stroke Mother    Social History  Substance Use Topics  . Smoking status: Never Smoker   . Smokeless tobacco: None  . Alcohol Use: No    Review of Systems  All systems reviewed and negative, other than as noted in HPI.   Allergies  Sulfa antibiotics  Home Medications   Prior to Admission medications   Medication Sig Start Date End Date Taking? Authorizing Provider  amitriptyline (ELAVIL) 25 MG tablet Take 1 tablet (25 mg total) by mouth at bedtime as needed for sleep. 08/14/14    Rexene Alberts, MD  aspirin 81 MG chewable tablet Chew 81 mg by mouth every morning.     Historical Provider, MD  atenolol (TENORMIN) 25 MG tablet Take 0.5 tablets (12.5 mg total) by mouth 2 (two) times daily. 08/14/14   Rexene Alberts, MD  atorvastatin (LIPITOR) 80 MG tablet Take 0.5 tablets (40 mg total) by mouth at bedtime. 08/14/14   Rexene Alberts, MD  hydroxypropyl methylcellulose (ISOPTO TEARS) 2.5 % ophthalmic solution Place 1 drop into both eyes daily as needed. For dry eye relief    Historical Provider, MD  insulin NPH-regular Human (NOVOLIN 70/30) (70-30) 100 UNIT/ML injection Inject 10-12 Units into the skin 2 (two) times daily. 10 units in the morning and 12 units at bedtime    Historical Provider, MD  methadone (DOLOPHINE) 10 MG tablet Take 10 mg by mouth 4 (four) times daily. 02/10/15   Historical Provider, MD  methadone (DOLOPHINE) 5 MG tablet Take 5 mg by mouth every 6 (six) hours as needed. 02/10/15   Historical Provider, MD  methocarbamol (ROBAXIN) 500 MG tablet Take 1 tablet (500 mg total) by mouth 2 (two) times daily. 01/29/15   Tammy Triplett, PA-C  polyethylene glycol powder (MIRALAX) powder Take 17 g by mouth 2 (two) times daily. 08/14/14   Rexene Alberts, MD  predniSONE (DELTASONE) 5 MG tablet Take 5 mg by mouth every morning.  Historical Provider, MD  pregabalin (LYRICA) 50 MG capsule Take 50 mg by mouth 2 (two) times daily.    Historical Provider, MD  Tamsulosin HCl (FLOMAX) 0.4 MG CAPS Take 0.4 mg by mouth at bedtime.     Historical Provider, MD   BP 117/61 mmHg  Pulse 87  Temp(Src) 97.7 F (36.5 C) (Oral)  Resp 16  Ht 5\' 8"  (1.727 m)  Wt 165 lb (74.844 kg)  BMI 25.09 kg/m2  SpO2 100% Physical Exam  Constitutional: He appears well-developed and well-nourished. No distress.  HENT:  Head: Normocephalic and atraumatic.  Eyes: Conjunctivae are normal. Right eye exhibits no discharge. Left eye exhibits no discharge.  Neck: Neck supple.  Cardiovascular: Normal rate, regular  rhythm and normal heart sounds.  Exam reveals no gallop and no friction rub.   No murmur heard. Pulmonary/Chest: Effort normal and breath sounds normal. No respiratory distress.  Abdominal: Soft. He exhibits no distension. There is no tenderness.  Musculoskeletal: He exhibits no edema or tenderness.  Neurological: He is alert.  Skin: Skin is warm and dry.  Psychiatric: He has a normal mood and affect. His behavior is normal. Thought content normal.  Nursing note and vitals reviewed.   ED Course  Procedures (including critical care time) Labs Review Labs Reviewed  BASIC METABOLIC PANEL    Imaging Review No results found. I have personally reviewed and evaluated these images and lab results as part of my medical decision-making.   EKG Interpretation   Date/Time:  Friday December 04 2015 17:52:19 EST Ventricular Rate:  70 PR Interval:  173 QRS Duration: 110 QT Interval:  424 QTC Calculation: 457 R Axis:   -23 Text Interpretation:  Sinus rhythm Inferior infarct, old Non-specific ST-t  changes Confirmed by Wilson Singer  MD, Keen Ewalt ([id number]) on 12/04/2015 5:56:35 PM      MDM   Final diagnoses:  Hyperkalemia    64 year old male with hyperkalemia. This seems to be incidentally noted on workup yesterday at outside facility. Reported potassium of 5.6. Repeat potassium today is 5.5. This is still mildly elevated but I do not feel that he needs further emergent workup. EKG with nonspecific changes. He does have a nonspecific intraventricular conduction delay but this was noted on prior EKG. He was given dose of Kayexalate in the emergency room. His medications were reviewed. No obvious offenders. Cr stable from last labs for comparison. Will have a follow-up early next week for repeat BMP. Return precautions sooner were discussed.    Virgel Manifold, MD 12/15/15 [phone]

## 2015-12-04 NOTE — ED Notes (Addendum)
Pt was seen at [facility] ED yesterday and was diagnosed with hyperkalemia. Potassium was 5.6. Pt given Kayexalate in ED and was instructed to go to UC for a re-check today. [name] states that UC was unable to give instant results, so they were instructed to come back to ED. Pt has no complaints at this time. Pt had heart surgery on July 25th.

## 2015-12-04 NOTE — Discharge Instructions (Signed)
Your potassium today remains mildly elevated at 5.5. I do not feel that you need further emergency intervention at this time though. I would like you to follow-up with your PCP or otherwise obtain repeat blood work on Monday.  Hyperkalemia Hyperkalemia is when you have too much potassium in your blood. Potassium is normally removed (excreted) from your body by your kidneys. If there is too much potassium in your blood, it can affect your heart's ability to function.  CAUSES  Hyperkalemia may be caused by:   Taking in too much potassium. You can do this by:  Using salt substitutes. They contain large amounts of potassium.  Taking potassium supplements.  Eating foods high in potassium.  Excreting too little potassium. This can happen if:  Your kidneys are not working properly. Kidney (renal) disease, including short- or long-term renal failure, is a very common cause of hyperkalemia.  You are taking medicines that lower your excretion of potassium.  You have Addison disease.  You have a urinary tract blockage, such as kidney stones.  You are on treatment to mechanically clean your blood (dialysis) and you skip a treatment.  Releasing a high amount of potassium from your cells into your blood. This can happen with:  Injury to muscles (rhabdomyolysis) or other tissues. Most potassium is stored in your muscles.  Severe burns or infections.  Acidic blood plasma (acidosis). Acidosis can result from many diseases, such as uncontrolled diabetes. RISK FACTORS The most common risk factor of hyperkalemia is kidney disease. Other risk factors of hyperkalemia include:  Addison disease. This is a condition where your glands do not produce enough hormones.  Alcoholism or heavy drug use.   Using certain blood pressure medicines, such as angiotensin-converting enzyme (ACE) inhibitors, angiotensin II receptor blockers (ARBs), or potassium-sparing diuretics such as spironolactone.  Severe  injury or burn. SIGNS AND SYMPTOMS  Oftentimes, there are no signs or symptoms of hyperkalemia. However, when your potassium level becomes high enough, you may experience symptoms such as:  Irregular or very slow heartbeat.  Nausea.  Fatigue.  Tingling of the skin or numbness of the hands or feet.  Muscle weakness.  Fatigue.  Not being able to move (paralysis). You may not have any symptoms of hyperkalemia.  DIAGNOSIS  Hyperkalemia may be diagnosed by:  Physical exam.  Blood tests.  ECG (electrocardiogram).  Discussion of prescription and non-prescription drug use. TREATMENT  Treatment for hyperkalemia is often directed at the underlying cause. In some instances, treatment may include:   Insulin.  Glucose (sugar) and water solution given through a vein (intravenous or IV).  Dialysis.  Medicines to remove the potassium from your body.  Medicines to move calcium from your bloodstream into your tissues. HOME CARE INSTRUCTIONS   Take medicines only as directed by your health care provider.  Do not take any supplements, natural products, herbs, or vitamins without reviewing them with your health care provider. Certain supplements and natural food products can have high amounts of potassium.  Limit your alcohol intake as directed by your health care provider.  Stop illegal drug use. If you need help quitting, ask your health care provider.  Keep all follow-up visits as directed by your health care provider. This is important.  If you have kidney disease, you may need to follow a low potassium diet. A dietitian can help educate you on low potassium foods. SEEK MEDICAL CARE IF:   You notice an irregular or very slow heartbeat.  You feel light-headed.  You feel  weak.  You are nauseous.  You have tingling or numbness in your hands or feet. SEEK IMMEDIATE MEDICAL CARE IF:   You have shortness of breath.  You have chest pain or discomfort.  You pass  out.  You have muscle paralysis. MAKE SURE YOU:   Understand these instructions.  Will watch your condition.  Will get help right away if you are not doing well or get worse.   This information is not intended to replace advice given to you by your health care provider. Make sure you discuss any questions you have with your health care provider.   Document Released: 12/02/2002 Document Revised: 01/02/2015 Document Reviewed: 03/19/2014 Elsevier Interactive Patient Education [employer].

## 2015-12-08 DIAGNOSIS — I2510 Atherosclerotic heart disease of native coronary artery without angina pectoris: Secondary | ICD-10-CM | POA: Diagnosis not present

## 2015-12-08 DIAGNOSIS — E875 Hyperkalemia: Secondary | ICD-10-CM | POA: Diagnosis not present

## 2015-12-08 DIAGNOSIS — Z951 Presence of aortocoronary bypass graft: Secondary | ICD-10-CM | POA: Diagnosis not present

## 2015-12-08 DIAGNOSIS — I5022 Chronic systolic (congestive) heart failure: Secondary | ICD-10-CM | POA: Diagnosis not present

## 2015-12-11 DIAGNOSIS — E875 Hyperkalemia: Secondary | ICD-10-CM | POA: Diagnosis not present

## 2015-12-11 DIAGNOSIS — L97529 Non-pressure chronic ulcer of other part of left foot with unspecified severity: Secondary | ICD-10-CM | POA: Diagnosis not present

## 2015-12-11 DIAGNOSIS — E11621 Type 2 diabetes mellitus with foot ulcer: Secondary | ICD-10-CM | POA: Diagnosis not present

## 2016-01-19 DIAGNOSIS — M154 Erosive (osteo)arthritis: Secondary | ICD-10-CM | POA: Diagnosis not present

## 2016-01-19 DIAGNOSIS — Z794 Long term (current) use of insulin: Secondary | ICD-10-CM | POA: Diagnosis not present

## 2016-01-19 DIAGNOSIS — L97529 Non-pressure chronic ulcer of other part of left foot with unspecified severity: Secondary | ICD-10-CM | POA: Diagnosis not present

## 2016-01-19 DIAGNOSIS — E11621 Type 2 diabetes mellitus with foot ulcer: Secondary | ICD-10-CM | POA: Diagnosis not present

## 2016-01-19 DIAGNOSIS — S93145A Subluxation of metatarsophalangeal joint of left lesser toe(s), initial encounter: Secondary | ICD-10-CM | POA: Diagnosis not present

## 2016-01-19 DIAGNOSIS — Z7952 Long term (current) use of systemic steroids: Secondary | ICD-10-CM | POA: Diagnosis not present

## 2016-01-19 DIAGNOSIS — Z79899 Other long term (current) drug therapy: Secondary | ICD-10-CM | POA: Diagnosis not present

## 2016-01-22 DIAGNOSIS — E11621 Type 2 diabetes mellitus with foot ulcer: Secondary | ICD-10-CM | POA: Diagnosis not present

## 2016-01-22 DIAGNOSIS — E1152 Type 2 diabetes mellitus with diabetic peripheral angiopathy with gangrene: Secondary | ICD-10-CM | POA: Diagnosis not present

## 2016-01-22 DIAGNOSIS — M069 Rheumatoid arthritis, unspecified: Secondary | ICD-10-CM | POA: Diagnosis not present

## 2016-01-22 DIAGNOSIS — Z794 Long term (current) use of insulin: Secondary | ICD-10-CM | POA: Diagnosis not present

## 2016-01-22 DIAGNOSIS — L03032 Cellulitis of left toe: Secondary | ICD-10-CM | POA: Diagnosis not present

## 2016-01-22 DIAGNOSIS — Z7952 Long term (current) use of systemic steroids: Secondary | ICD-10-CM | POA: Diagnosis not present

## 2016-01-22 DIAGNOSIS — L97529 Non-pressure chronic ulcer of other part of left foot with unspecified severity: Secondary | ICD-10-CM | POA: Diagnosis not present

## 2016-01-22 DIAGNOSIS — R609 Edema, unspecified: Secondary | ICD-10-CM | POA: Diagnosis not present

## 2016-01-27 DIAGNOSIS — I10 Essential (primary) hypertension: Secondary | ICD-10-CM | POA: Diagnosis not present

## 2016-01-27 DIAGNOSIS — I739 Peripheral vascular disease, unspecified: Secondary | ICD-10-CM | POA: Diagnosis not present

## 2016-01-27 DIAGNOSIS — I743 Embolism and thrombosis of arteries of the lower extremities: Secondary | ICD-10-CM | POA: Diagnosis not present

## 2016-01-27 DIAGNOSIS — Z951 Presence of aortocoronary bypass graft: Secondary | ICD-10-CM | POA: Diagnosis not present

## 2016-01-27 DIAGNOSIS — E785 Hyperlipidemia, unspecified: Secondary | ICD-10-CM | POA: Diagnosis not present

## 2016-01-27 DIAGNOSIS — E11621 Type 2 diabetes mellitus with foot ulcer: Secondary | ICD-10-CM | POA: Diagnosis not present

## 2016-01-27 DIAGNOSIS — L97529 Non-pressure chronic ulcer of other part of left foot with unspecified severity: Secondary | ICD-10-CM | POA: Diagnosis not present

## 2016-01-27 DIAGNOSIS — I252 Old myocardial infarction: Secondary | ICD-10-CM | POA: Diagnosis not present

## 2016-01-27 DIAGNOSIS — M069 Rheumatoid arthritis, unspecified: Secondary | ICD-10-CM | POA: Diagnosis not present

## 2016-01-27 DIAGNOSIS — I2510 Atherosclerotic heart disease of native coronary artery without angina pectoris: Secondary | ICD-10-CM | POA: Diagnosis not present

## 2016-02-01 DIAGNOSIS — L97529 Non-pressure chronic ulcer of other part of left foot with unspecified severity: Secondary | ICD-10-CM | POA: Diagnosis not present

## 2016-02-01 DIAGNOSIS — E11621 Type 2 diabetes mellitus with foot ulcer: Secondary | ICD-10-CM | POA: Diagnosis not present

## 2016-02-08 DIAGNOSIS — E11621 Type 2 diabetes mellitus with foot ulcer: Secondary | ICD-10-CM | POA: Diagnosis not present

## 2016-02-08 DIAGNOSIS — L97529 Non-pressure chronic ulcer of other part of left foot with unspecified severity: Secondary | ICD-10-CM | POA: Diagnosis not present

## 2016-02-09 DIAGNOSIS — M868X7 Other osteomyelitis, ankle and foot: Secondary | ICD-10-CM | POA: Diagnosis not present

## 2016-02-09 DIAGNOSIS — E1169 Type 2 diabetes mellitus with other specified complication: Secondary | ICD-10-CM | POA: Diagnosis not present

## 2016-02-09 DIAGNOSIS — I739 Peripheral vascular disease, unspecified: Secondary | ICD-10-CM | POA: Diagnosis not present

## 2016-02-17 DIAGNOSIS — Z96642 Presence of left artificial hip joint: Secondary | ICD-10-CM | POA: Diagnosis not present

## 2016-02-17 DIAGNOSIS — Z7982 Long term (current) use of aspirin: Secondary | ICD-10-CM | POA: Diagnosis not present

## 2016-02-17 DIAGNOSIS — S93145A Subluxation of metatarsophalangeal joint of left lesser toe(s), initial encounter: Secondary | ICD-10-CM | POA: Diagnosis not present

## 2016-02-17 DIAGNOSIS — Z7952 Long term (current) use of systemic steroids: Secondary | ICD-10-CM | POA: Diagnosis not present

## 2016-02-17 DIAGNOSIS — M86172 Other acute osteomyelitis, left ankle and foot: Secondary | ICD-10-CM | POA: Diagnosis not present

## 2016-02-17 DIAGNOSIS — E1140 Type 2 diabetes mellitus with diabetic neuropathy, unspecified: Secondary | ICD-10-CM | POA: Diagnosis not present

## 2016-02-17 DIAGNOSIS — I2510 Atherosclerotic heart disease of native coronary artery without angina pectoris: Secondary | ICD-10-CM | POA: Diagnosis not present

## 2016-02-17 DIAGNOSIS — L97529 Non-pressure chronic ulcer of other part of left foot with unspecified severity: Secondary | ICD-10-CM | POA: Diagnosis not present

## 2016-02-17 DIAGNOSIS — Z89422 Acquired absence of other left toe(s): Secondary | ICD-10-CM | POA: Diagnosis not present

## 2016-02-17 DIAGNOSIS — Z951 Presence of aortocoronary bypass graft: Secondary | ICD-10-CM | POA: Diagnosis not present

## 2016-02-17 DIAGNOSIS — E7800 Pure hypercholesterolemia, unspecified: Secondary | ICD-10-CM | POA: Diagnosis not present

## 2016-02-17 DIAGNOSIS — E11311 Type 2 diabetes mellitus with unspecified diabetic retinopathy with macular edema: Secondary | ICD-10-CM | POA: Diagnosis not present

## 2016-02-17 DIAGNOSIS — M069 Rheumatoid arthritis, unspecified: Secondary | ICD-10-CM | POA: Diagnosis not present

## 2016-02-17 DIAGNOSIS — Z79891 Long term (current) use of opiate analgesic: Secondary | ICD-10-CM | POA: Diagnosis not present

## 2016-02-17 DIAGNOSIS — I5040 Unspecified combined systolic (congestive) and diastolic (congestive) heart failure: Secondary | ICD-10-CM | POA: Diagnosis not present

## 2016-02-17 DIAGNOSIS — Z79899 Other long term (current) drug therapy: Secondary | ICD-10-CM | POA: Diagnosis not present

## 2016-02-17 DIAGNOSIS — I110 Hypertensive heart disease with heart failure: Secondary | ICD-10-CM | POA: Diagnosis not present

## 2016-02-17 DIAGNOSIS — Z882 Allergy status to sulfonamides status: Secondary | ICD-10-CM | POA: Diagnosis not present

## 2016-02-17 DIAGNOSIS — G8929 Other chronic pain: Secondary | ICD-10-CM | POA: Diagnosis not present

## 2016-02-17 DIAGNOSIS — E118 Type 2 diabetes mellitus with unspecified complications: Secondary | ICD-10-CM | POA: Diagnosis not present

## 2016-02-17 DIAGNOSIS — E1169 Type 2 diabetes mellitus with other specified complication: Secondary | ICD-10-CM | POA: Diagnosis not present

## 2016-02-17 DIAGNOSIS — Z794 Long term (current) use of insulin: Secondary | ICD-10-CM | POA: Diagnosis not present

## 2016-02-17 DIAGNOSIS — I4891 Unspecified atrial fibrillation: Secondary | ICD-10-CM | POA: Diagnosis not present

## 2016-02-17 DIAGNOSIS — I252 Old myocardial infarction: Secondary | ICD-10-CM | POA: Diagnosis not present

## 2016-02-17 DIAGNOSIS — M869 Osteomyelitis, unspecified: Secondary | ICD-10-CM | POA: Diagnosis not present

## 2016-02-22 DIAGNOSIS — M7989 Other specified soft tissue disorders: Secondary | ICD-10-CM | POA: Diagnosis not present

## 2016-02-22 DIAGNOSIS — I5020 Unspecified systolic (congestive) heart failure: Secondary | ICD-10-CM | POA: Diagnosis not present

## 2016-02-22 DIAGNOSIS — I129 Hypertensive chronic kidney disease with stage 1 through stage 4 chronic kidney disease, or unspecified chronic kidney disease: Secondary | ICD-10-CM | POA: Diagnosis not present

## 2016-02-22 DIAGNOSIS — E1369 Other specified diabetes mellitus with other specified complication: Secondary | ICD-10-CM | POA: Diagnosis not present

## 2016-02-22 DIAGNOSIS — N183 Chronic kidney disease, stage 3 (moderate): Secondary | ICD-10-CM | POA: Diagnosis not present

## 2016-02-22 DIAGNOSIS — R269 Unspecified abnormalities of gait and mobility: Secondary | ICD-10-CM | POA: Diagnosis not present

## 2016-02-22 DIAGNOSIS — M869 Osteomyelitis, unspecified: Secondary | ICD-10-CM | POA: Diagnosis not present

## 2016-02-22 DIAGNOSIS — M069 Rheumatoid arthritis, unspecified: Secondary | ICD-10-CM | POA: Diagnosis not present

## 2016-02-22 DIAGNOSIS — I4891 Unspecified atrial fibrillation: Secondary | ICD-10-CM | POA: Diagnosis not present

## 2016-02-22 DIAGNOSIS — I2510 Atherosclerotic heart disease of native coronary artery without angina pectoris: Secondary | ICD-10-CM | POA: Diagnosis not present

## 2016-02-22 DIAGNOSIS — Z89422 Acquired absence of other left toe(s): Secondary | ICD-10-CM | POA: Diagnosis not present

## 2016-02-22 DIAGNOSIS — E1122 Type 2 diabetes mellitus with diabetic chronic kidney disease: Secondary | ICD-10-CM | POA: Diagnosis not present

## 2016-02-22 DIAGNOSIS — H3581 Retinal edema: Secondary | ICD-10-CM | POA: Diagnosis not present

## 2016-02-26 DIAGNOSIS — E1169 Type 2 diabetes mellitus with other specified complication: Secondary | ICD-10-CM | POA: Diagnosis present

## 2016-02-26 DIAGNOSIS — R9431 Abnormal electrocardiogram [ECG] [EKG]: Secondary | ICD-10-CM | POA: Diagnosis not present

## 2016-02-26 DIAGNOSIS — Z7982 Long term (current) use of aspirin: Secondary | ICD-10-CM | POA: Diagnosis not present

## 2016-02-26 DIAGNOSIS — Z89422 Acquired absence of other left toe(s): Secondary | ICD-10-CM | POA: Diagnosis not present

## 2016-02-26 DIAGNOSIS — Z794 Long term (current) use of insulin: Secondary | ICD-10-CM | POA: Diagnosis not present

## 2016-02-26 DIAGNOSIS — M069 Rheumatoid arthritis, unspecified: Secondary | ICD-10-CM | POA: Diagnosis present

## 2016-02-26 DIAGNOSIS — E1142 Type 2 diabetes mellitus with diabetic polyneuropathy: Secondary | ICD-10-CM | POA: Diagnosis present

## 2016-02-26 DIAGNOSIS — Z882 Allergy status to sulfonamides status: Secondary | ICD-10-CM | POA: Diagnosis not present

## 2016-02-26 DIAGNOSIS — I5022 Chronic systolic (congestive) heart failure: Secondary | ICD-10-CM | POA: Diagnosis present

## 2016-02-26 DIAGNOSIS — Z79899 Other long term (current) drug therapy: Secondary | ICD-10-CM | POA: Diagnosis not present

## 2016-02-26 DIAGNOSIS — I739 Peripheral vascular disease, unspecified: Secondary | ICD-10-CM | POA: Diagnosis present

## 2016-02-26 DIAGNOSIS — E11628 Type 2 diabetes mellitus with other skin complications: Secondary | ICD-10-CM | POA: Diagnosis present

## 2016-02-26 DIAGNOSIS — I252 Old myocardial infarction: Secondary | ICD-10-CM | POA: Diagnosis not present

## 2016-02-26 DIAGNOSIS — E1136 Type 2 diabetes mellitus with diabetic cataract: Secondary | ICD-10-CM | POA: Diagnosis present

## 2016-02-26 DIAGNOSIS — H2510 Age-related nuclear cataract, unspecified eye: Secondary | ICD-10-CM | POA: Diagnosis present

## 2016-02-26 DIAGNOSIS — M85872 Other specified disorders of bone density and structure, left ankle and foot: Secondary | ICD-10-CM | POA: Diagnosis not present

## 2016-02-26 DIAGNOSIS — I2510 Atherosclerotic heart disease of native coronary artery without angina pectoris: Secondary | ICD-10-CM | POA: Diagnosis present

## 2016-02-26 DIAGNOSIS — I110 Hypertensive heart disease with heart failure: Secondary | ICD-10-CM | POA: Diagnosis present

## 2016-02-26 DIAGNOSIS — Z951 Presence of aortocoronary bypass graft: Secondary | ICD-10-CM | POA: Diagnosis not present

## 2016-02-26 DIAGNOSIS — Z96642 Presence of left artificial hip joint: Secondary | ICD-10-CM | POA: Diagnosis present

## 2016-02-26 DIAGNOSIS — M869 Osteomyelitis, unspecified: Secondary | ICD-10-CM | POA: Diagnosis present

## 2016-02-26 DIAGNOSIS — E113219 Type 2 diabetes mellitus with mild nonproliferative diabetic retinopathy with macular edema, unspecified eye: Secondary | ICD-10-CM | POA: Diagnosis present

## 2016-02-26 DIAGNOSIS — L03116 Cellulitis of left lower limb: Secondary | ICD-10-CM | POA: Diagnosis not present

## 2016-02-26 DIAGNOSIS — Z7952 Long term (current) use of systemic steroids: Secondary | ICD-10-CM | POA: Diagnosis not present

## 2016-03-07 DIAGNOSIS — N183 Chronic kidney disease, stage 3 (moderate): Secondary | ICD-10-CM | POA: Diagnosis not present

## 2016-03-07 DIAGNOSIS — B351 Tinea unguium: Secondary | ICD-10-CM | POA: Diagnosis not present

## 2016-03-07 DIAGNOSIS — I4891 Unspecified atrial fibrillation: Secondary | ICD-10-CM | POA: Diagnosis not present

## 2016-03-07 DIAGNOSIS — E113299 Type 2 diabetes mellitus with mild nonproliferative diabetic retinopathy without macular edema, unspecified eye: Secondary | ICD-10-CM | POA: Diagnosis not present

## 2016-03-07 DIAGNOSIS — R7989 Other specified abnormal findings of blood chemistry: Secondary | ICD-10-CM | POA: Diagnosis not present

## 2016-03-07 DIAGNOSIS — I5020 Unspecified systolic (congestive) heart failure: Secondary | ICD-10-CM | POA: Diagnosis not present

## 2016-03-07 DIAGNOSIS — D61818 Other pancytopenia: Secondary | ICD-10-CM | POA: Diagnosis not present

## 2016-03-07 DIAGNOSIS — E1122 Type 2 diabetes mellitus with diabetic chronic kidney disease: Secondary | ICD-10-CM | POA: Diagnosis not present

## 2016-03-07 DIAGNOSIS — R4182 Altered mental status, unspecified: Secondary | ICD-10-CM | POA: Diagnosis not present

## 2016-03-07 DIAGNOSIS — R269 Unspecified abnormalities of gait and mobility: Secondary | ICD-10-CM | POA: Diagnosis not present

## 2016-03-07 DIAGNOSIS — Z951 Presence of aortocoronary bypass graft: Secondary | ICD-10-CM | POA: Diagnosis not present

## 2016-03-07 DIAGNOSIS — I2510 Atherosclerotic heart disease of native coronary artery without angina pectoris: Secondary | ICD-10-CM | POA: Diagnosis not present

## 2016-03-07 DIAGNOSIS — H3581 Retinal edema: Secondary | ICD-10-CM | POA: Diagnosis not present

## 2016-03-07 DIAGNOSIS — E1136 Type 2 diabetes mellitus with diabetic cataract: Secondary | ICD-10-CM | POA: Diagnosis not present

## 2016-03-07 DIAGNOSIS — Z8619 Personal history of other infectious and parasitic diseases: Secondary | ICD-10-CM | POA: Diagnosis not present

## 2016-03-07 DIAGNOSIS — L03116 Cellulitis of left lower limb: Secondary | ICD-10-CM | POA: Diagnosis not present

## 2016-03-07 DIAGNOSIS — Z89422 Acquired absence of other left toe(s): Secondary | ICD-10-CM | POA: Diagnosis not present

## 2016-03-07 DIAGNOSIS — R001 Bradycardia, unspecified: Secondary | ICD-10-CM | POA: Diagnosis not present

## 2016-03-07 DIAGNOSIS — M069 Rheumatoid arthritis, unspecified: Secondary | ICD-10-CM | POA: Diagnosis not present

## 2016-03-07 DIAGNOSIS — L84 Corns and callosities: Secondary | ICD-10-CM | POA: Diagnosis not present

## 2016-03-07 DIAGNOSIS — E1151 Type 2 diabetes mellitus with diabetic peripheral angiopathy without gangrene: Secondary | ICD-10-CM | POA: Diagnosis not present

## 2016-03-07 DIAGNOSIS — I4581 Long QT syndrome: Secondary | ICD-10-CM | POA: Diagnosis not present

## 2016-03-07 DIAGNOSIS — I130 Hypertensive heart and chronic kidney disease with heart failure and stage 1 through stage 4 chronic kidney disease, or unspecified chronic kidney disease: Secondary | ICD-10-CM | POA: Diagnosis not present

## 2016-03-07 DIAGNOSIS — E7800 Pure hypercholesterolemia, unspecified: Secondary | ICD-10-CM | POA: Diagnosis not present

## 2016-03-07 DIAGNOSIS — Z794 Long term (current) use of insulin: Secondary | ICD-10-CM | POA: Diagnosis not present

## 2016-03-14 DIAGNOSIS — H269 Unspecified cataract: Secondary | ICD-10-CM | POA: Diagnosis not present

## 2016-03-14 DIAGNOSIS — Z79899 Other long term (current) drug therapy: Secondary | ICD-10-CM | POA: Diagnosis not present

## 2016-03-14 DIAGNOSIS — M069 Rheumatoid arthritis, unspecified: Secondary | ICD-10-CM | POA: Diagnosis not present

## 2016-03-14 DIAGNOSIS — H43812 Vitreous degeneration, left eye: Secondary | ICD-10-CM | POA: Diagnosis not present

## 2016-03-14 DIAGNOSIS — H2513 Age-related nuclear cataract, bilateral: Secondary | ICD-10-CM | POA: Diagnosis not present

## 2016-03-14 DIAGNOSIS — E113213 Type 2 diabetes mellitus with mild nonproliferative diabetic retinopathy with macular edema, bilateral: Secondary | ICD-10-CM | POA: Diagnosis not present

## 2016-03-14 DIAGNOSIS — S90122A Contusion of left lesser toe(s) without damage to nail, initial encounter: Secondary | ICD-10-CM | POA: Diagnosis not present

## 2016-03-14 DIAGNOSIS — E11311 Type 2 diabetes mellitus with unspecified diabetic retinopathy with macular edema: Secondary | ICD-10-CM | POA: Diagnosis not present

## 2016-03-21 DIAGNOSIS — E11621 Type 2 diabetes mellitus with foot ulcer: Secondary | ICD-10-CM | POA: Diagnosis not present

## 2016-03-21 DIAGNOSIS — L03116 Cellulitis of left lower limb: Secondary | ICD-10-CM | POA: Diagnosis not present

## 2016-03-21 DIAGNOSIS — L97529 Non-pressure chronic ulcer of other part of left foot with unspecified severity: Secondary | ICD-10-CM | POA: Diagnosis not present

## 2016-03-25 DIAGNOSIS — M869 Osteomyelitis, unspecified: Secondary | ICD-10-CM | POA: Diagnosis not present

## 2016-03-25 DIAGNOSIS — L97529 Non-pressure chronic ulcer of other part of left foot with unspecified severity: Secondary | ICD-10-CM | POA: Diagnosis not present

## 2016-03-25 DIAGNOSIS — E1152 Type 2 diabetes mellitus with diabetic peripheral angiopathy with gangrene: Secondary | ICD-10-CM | POA: Diagnosis not present

## 2016-03-25 DIAGNOSIS — I739 Peripheral vascular disease, unspecified: Secondary | ICD-10-CM | POA: Diagnosis not present

## 2016-03-25 DIAGNOSIS — E11621 Type 2 diabetes mellitus with foot ulcer: Secondary | ICD-10-CM | POA: Diagnosis not present

## 2016-03-25 DIAGNOSIS — E1169 Type 2 diabetes mellitus with other specified complication: Secondary | ICD-10-CM | POA: Diagnosis not present

## 2016-03-25 DIAGNOSIS — Z89422 Acquired absence of other left toe(s): Secondary | ICD-10-CM | POA: Diagnosis not present

## 2016-03-28 ENCOUNTER — Encounter: Payer: Medicare Other | Attending: Surgery | Admitting: Surgery

## 2016-03-28 DIAGNOSIS — M069 Rheumatoid arthritis, unspecified: Secondary | ICD-10-CM | POA: Insufficient documentation

## 2016-03-28 DIAGNOSIS — L97522 Non-pressure chronic ulcer of other part of left foot with fat layer exposed: Secondary | ICD-10-CM | POA: Insufficient documentation

## 2016-03-28 DIAGNOSIS — I70245 Atherosclerosis of native arteries of left leg with ulceration of other part of foot: Secondary | ICD-10-CM | POA: Insufficient documentation

## 2016-03-28 DIAGNOSIS — I739 Peripheral vascular disease, unspecified: Secondary | ICD-10-CM | POA: Diagnosis not present

## 2016-03-28 DIAGNOSIS — E1122 Type 2 diabetes mellitus with diabetic chronic kidney disease: Secondary | ICD-10-CM | POA: Insufficient documentation

## 2016-03-28 DIAGNOSIS — Z882 Allergy status to sulfonamides status: Secondary | ICD-10-CM | POA: Insufficient documentation

## 2016-03-28 DIAGNOSIS — E11311 Type 2 diabetes mellitus with unspecified diabetic retinopathy with macular edema: Secondary | ICD-10-CM | POA: Insufficient documentation

## 2016-03-28 DIAGNOSIS — E11621 Type 2 diabetes mellitus with foot ulcer: Secondary | ICD-10-CM | POA: Diagnosis not present

## 2016-03-28 DIAGNOSIS — Z79899 Other long term (current) drug therapy: Secondary | ICD-10-CM | POA: Diagnosis not present

## 2016-03-28 DIAGNOSIS — D61818 Other pancytopenia: Secondary | ICD-10-CM | POA: Diagnosis not present

## 2016-03-28 DIAGNOSIS — T8131XA Disruption of external operation (surgical) wound, not elsewhere classified, initial encounter: Secondary | ICD-10-CM | POA: Diagnosis not present

## 2016-03-28 DIAGNOSIS — I130 Hypertensive heart and chronic kidney disease with heart failure and stage 1 through stage 4 chronic kidney disease, or unspecified chronic kidney disease: Secondary | ICD-10-CM | POA: Insufficient documentation

## 2016-03-28 DIAGNOSIS — I5022 Chronic systolic (congestive) heart failure: Secondary | ICD-10-CM | POA: Diagnosis not present

## 2016-03-28 DIAGNOSIS — N183 Chronic kidney disease, stage 3 (moderate): Secondary | ICD-10-CM | POA: Diagnosis not present

## 2016-03-28 DIAGNOSIS — Z7982 Long term (current) use of aspirin: Secondary | ICD-10-CM | POA: Diagnosis not present

## 2016-03-28 DIAGNOSIS — E1140 Type 2 diabetes mellitus with diabetic neuropathy, unspecified: Secondary | ICD-10-CM | POA: Diagnosis not present

## 2016-03-28 DIAGNOSIS — Z89421 Acquired absence of other right toe(s): Secondary | ICD-10-CM | POA: Diagnosis not present

## 2016-03-28 DIAGNOSIS — Z794 Long term (current) use of insulin: Secondary | ICD-10-CM | POA: Diagnosis not present

## 2016-03-30 NOTE — Progress Notes (Signed)
HARBOR, BARRERAS ([id number]) Visit Report for 03/28/2016 Allergy List Details Patient Name: [name], [name] Date of Service: 03/28/2016 9:30 AM Medical Record Number: [id number] Patient Account Number: [id number] Date of Birth/Sex: 02-18-1951 (65 y.o. Male) Treating RN: Ahmed Prima Primary Care Physician: Starlyn Skeans Other Clinician: Referring Physician: Buddy Duty Treating Physician/Extender: Frann Rider in Treatment: 0 Allergies Active Allergies sulfer Reaction: rash Severity: Severe Allergy Notes Electronic Signature(s) Signed: 03/29/2016 4:28:40 PM By: Alric Quan Entered By: Alric Quan on 03/28/2016 09:27:23 [name], [name] ([id number]) -------------------------------------------------------------------------------- Arrival Information Details Patient Name: [name], [name] Date of Service: 03/28/2016 9:30 AM Medical Record Number: [id number] Patient Account Number: [id number] Date of Birth/Sex: 1951-02-06 (65 y.o. Male) Treating RN: Ahmed Prima Primary Care Physician: Starlyn Skeans Other Clinician: Referring Physician: Buddy Duty Treating Physician/Extender: Frann Rider in Treatment: 0 Visit Information Patient Arrived: Wheel Chair Arrival Time: 09:18 Accompanied By: wife Transfer Assistance: EasyPivot Patient Lift Patient Identification Verified: Yes Secondary Verification Process Yes Completed: Patient Requires Transmission- No Based Precautions: Patient Has Alerts: Yes Patient Alerts: DM II Electronic Signature(s) Signed: 03/29/2016 4:28:40 PM By: Alric Quan Entered By: Alric Quan on 03/28/2016 09:22:12 [name], [name] ([id number]) -------------------------------------------------------------------------------- Clinic Level of Care Assessment Details Patient Name: [name], [name] Date of Service: 03/28/2016 9:30 AM Medical Record Number: [id number] Patient Account Number: [id number] Date of Birth/Sex: 11-20-1951 (65  y.o. Male) Treating RN: Carolyne Fiscal, Debi Primary Care Physician: Starlyn Skeans Other Clinician: Referring Physician: Buddy Duty Treating Physician/Extender: Frann Rider in Treatment: 0 Clinic Level of Care Assessment Items TOOL 1 Quantity Score X - Use when EandM and Procedure is performed on INITIAL visit 1 0 ASSESSMENTS - Nursing Assessment / Reassessment X - General Physical Exam (combine w/ comprehensive assessment (listed just 1 20 below) when performed on new pt. evals) X - Comprehensive Assessment (HX, ROS, Risk Assessments, Wounds Hx, etc.) 1 25 ASSESSMENTS - Wound and Skin Assessment / Reassessment []  - Dermatologic / Skin Assessment (not related to wound area) 0 ASSESSMENTS - Ostomy and/or Continence Assessment and Care []  - Incontinence Assessment and Management 0 []  - Ostomy Care Assessment and Management (repouching, etc.) 0 PROCESS - Coordination of Care []  - Simple Patient / Family Education for ongoing care 0 X - Complex (extensive) Patient / Family Education for ongoing care 1 20 []  - Staff obtains Programmer, systems, Records, Test Results / Process Orders 0 []  - Staff telephones HHA, Nursing Homes / Clarify orders / etc 0 []  - Routine Transfer to another Facility (non-emergent condition) 0 []  - Routine Hospital Admission (non-emergent condition) 0 X - New Admissions / Biomedical engineer / Ordering NPWT, Apligraf, etc. 1 15 []  - Emergency Hospital Admission (emergent condition) 0 PROCESS - Special Needs []  - Pediatric / Minor Patient Management 0 []  - Isolation Patient Management 0 [name], [name] ([id number]) []  - Hearing / Language / Visual special needs 0 []  - Assessment of Community assistance (transportation, D/C planning, etc.) 0 []  - Additional assistance / Altered mentation 0 []  - Support Surface(s) Assessment (bed, cushion, seat, etc.) 0 INTERVENTIONS - Miscellaneous []  - External ear exam 0 []  - Patient Transfer (multiple staff / Civil Service fast streamer /  Similar devices) 0 []  - Simple Staple / Suture removal (25 or less) 0 []  - Complex Staple / Suture removal (26 or more) 0 []  - Hypo/Hyperglycemic Management (do not check if billed separately) 0 X - Ankle / Brachial Index (ABI) - do not check if billed separately 1 15 Has the patient been seen  at the hospital within the last three years: Yes Total Score: 95 Level Of Care: New/Established - Level 3 Electronic Signature(s) Signed: 03/29/2016 4:28:40 PM By: Alric Quan Entered By: Alric Quan on 03/28/2016 17:05:36 [name], [name] ([id number]) -------------------------------------------------------------------------------- Encounter Discharge Information Details Patient Name: [name], [name] Date of Service: 03/28/2016 9:30 AM Medical Record Number: [id number] Patient Account Number: [id number] Date of Birth/Sex: 1951/08/16 (65 y.o. Male) Treating RN: Ahmed Prima Primary Care Physician: Starlyn Skeans Other Clinician: Referring Physician: Buddy Duty Treating Physician/Extender: Frann Rider in Treatment: 0 Encounter Discharge Information Items Discharge Pain Level: 0 Discharge Condition: Stable Ambulatory Status: Wheelchair Discharge Destination: Home Transportation: Private Auto Accompanied By: wife Schedule Follow-up Appointment: Yes Medication Reconciliation completed No and provided to Patient/Care Jaasia Viglione: Provided on Clinical Summary of Care: 03/28/2016 Form Type Recipient Paper Patient JP Electronic Signature(s) Signed: 03/28/2016 11:16:19 AM By: Ruthine Dose Entered By: Ruthine Dose on 03/28/2016 11:16:19 [name] ([id number]) -------------------------------------------------------------------------------- Lower Extremity Assessment Details Patient Name: [name], [name] Date of Service: 03/28/2016 9:30 AM Medical Record Number: [id number] Patient Account Number: [id number] Date of Birth/Sex: 29-Sep-1951 (65 y.o. Male) Treating RN: Ahmed Prima Primary Care Physician: Starlyn Skeans Other Clinician: Referring Physician: Buddy Duty Treating Physician/Extender: Frann Rider in Treatment: 0 Vascular Assessment Pulses: Posterior Tibial Dorsalis Pedis Palpable: [Left:No] [Right:No] Doppler: [Left:Monophasic] [Right:Monophasic] Extremity colors, hair growth, and conditions: Extremity Color: [Left:Normal] [Right:Normal] Hair Growth on Extremity: [Left:Yes] Temperature of Extremity: [Left:Warm] [Right:Warm] Capillary Refill: [Left:< 3 seconds] [Right:< 3 seconds] Blood Pressure: Brachial: [Left:118] Dorsalis Pedis: 120 [Left:Dorsalis Pedis:] Ankle: Posterior Tibial: [Left:Posterior Tibial: 1.02] Toe Nail Assessment Left: Right: Thick: No No Discolored: No No Deformed: Yes Yes Improper Length and Hygiene: No No Notes right leg non-compressible Electronic Signature(s) Signed: 03/29/2016 4:28:40 PM By: Alric Quan Entered By: Alric Quan on 03/28/2016 10:31:06 Bodi, [name] ([id number]) -------------------------------------------------------------------------------- Multi Wound Chart Details Patient Name: [name], [name] Date of Service: 03/28/2016 9:30 AM Medical Record Number: [id number] Patient Account Number: [id number] Date of Birth/Sex: June 06, 1951 (65 y.o. Male) Treating RN: Carolyne Fiscal, Debi Primary Care Physician: Starlyn Skeans Other Clinician: Referring Physician: Buddy Duty Treating Physician/Extender: Frann Rider in Treatment: 0 Vital Signs Height(in): 69 Pulse(bpm): 66 Weight(lbs): Blood Pressure 118/58 (mmHg): Body Mass Index(BMI): Temperature(F): 97.5 Respiratory Rate 18 (breaths/min): Photos: [1:No Photos] [N/A:N/A] Wound Location: [1:Left Metatarsal head fourth] [N/A:N/A] Wounding Event: [1:Surgical Injury] [N/A:N/A] Primary Etiology: [1:Open Surgical Wound] [N/A:N/A] Comorbid History: [1:Cataracts, Arrhythmia, Congestive Heart Failure, Hypertension, Type  II Diabetes, Rheumatoid Arthritis, Neuropathy] [N/A:N/A] Date Acquired: [1:02/26/2016] [N/A:N/A] Weeks of Treatment: [1:0] [N/A:N/A] Wound Status: [1:Open] [N/A:N/A] Measurements L x W x D 2.1x0.4x0.1 [N/A:N/A] (cm) Area (cm) : [1:0.66] [N/A:N/A] Volume (cm) : [1:0.066] [N/A:N/A] Classification: [1:Partial Thickness] [N/A:N/A] HBO Classification: [1:Grade 1] [N/A:N/A] Exudate Amount: [1:Large] [N/A:N/A] Exudate Type: [1:Serous] [N/A:N/A] Exudate Color: [1:amber] [N/A:N/A] Wound Margin: [1:Flat and Intact] [N/A:N/A] Granulation Amount: [1:None Present (0%)] [N/A:N/A] Necrotic Amount: [1:Large (67-100%)] [N/A:N/A] Exposed Structures: [1:Fascia: No Fat: No Tendon: No Muscle: No Joint: No] [N/A:N/A] Bone: No Limited to Skin Breakdown Epithelialization: None N/A N/A Debridement: Debridement [id number] N/A N/A [id number]) Time-Out Taken: Yes N/A N/A Pain Control: Other N/A N/A Tissue Debrided: Fibrin/Slough, Exudates, N/A N/A Subcutaneous Level: Skin/Subcutaneous N/A N/A Tissue Debridement Area (sq 0.84 N/A N/A cm): Instrument: Forceps, Scissors N/A N/A Bleeding: Minimum N/A N/A Hemostasis Achieved: Pressure N/A N/A Procedural Pain: 0 N/A N/A Post Procedural Pain: 0 N/A N/A Debridement Treatment Procedure was tolerated N/A N/A Response: well Post Debridement 2.1x0.4x0.2 N/A N/A Measurements L x W  x D (cm) Post Debridement 0.132 N/A N/A Volume: (cm) Periwound Skin Texture: Edema: Yes N/A N/A Periwound Skin Moist: Yes N/A N/A Moisture: Periwound Skin Color: No Abnormalities Noted N/A N/A Temperature: No Abnormality N/A N/A Tenderness on Yes N/A N/A Palpation: Wound Preparation: Ulcer Cleansing: N/A N/A Rinsed/Irrigated with Saline Topical Anesthetic Applied: Other: lidocaine 4% Procedures Performed: Debridement N/A N/A Treatment Notes Wound #1 (Left Metatarsal head fourth) 1. Cleansed with: Clean wound with Normal Saline 2. Anesthetic Topical Lidocaine 4% cream to  wound bed prior to debridement 3. Peri-wound Care: [name], [name] ([id number]) Skin Prep 4. Dressing Applied: Other dressing (specify in notes) 5. Secondary Dressing Applied Dry Gauze 7. Secured with Tape Notes hydrogel with silver, foam to the deep tissues injuries Electronic Signature(s) Signed: 03/29/2016 4:28:40 PM By: Alric Quan Entered By: Alric Quan on 03/28/2016 17:04:59 [name], [name] ([id number]) -------------------------------------------------------------------------------- Frank Details Patient Name: [name], [name] Date of Service: 03/28/2016 9:30 AM Medical Record Number: [id number] Patient Account Number: [id number] Date of Birth/Sex: [birth year] (65 y.o. Male) Treating RN: Ahmed Prima Primary Care Physician: Starlyn Skeans Other Clinician: Referring Physician: Buddy Duty Treating Physician/Extender: Frann Rider in Treatment: 0 Active Inactive Abuse / Safety / Falls / Self Care Management Nursing Diagnoses: Potential for falls Goals: Patient will remain injury free Date Initiated: 03/28/2016 Goal Status: Active Interventions: Assess fall risk on admission and as needed Notes: Orientation to the Wound Care Program Nursing Diagnoses: Knowledge deficit related to the wound healing center program Goals: Patient/caregiver will verbalize understanding of the [facility] Date Initiated: 03/28/2016 Goal Status: Active Interventions: Provide education on orientation to the wound center Notes: Pain, Acute or Chronic Nursing Diagnoses: Pain, acute or chronic: actual or potential Potential alteration in comfort, pain Goals: [name], Sarvesh L. ([id number]) Patient will verbalize adequate pain control and receive pain control interventions during procedures as needed Date Initiated: 03/28/2016 Goal Status: Active Interventions: Assess comfort goal upon admission Complete pain assessment as per visit  requirements Notes: Wound/Skin Impairment Nursing Diagnoses: Impaired tissue integrity Goals: Ulcer/skin breakdown will have a volume reduction of 30% by week 4 Date Initiated: 03/28/2016 Goal Status: Active Ulcer/skin breakdown will have a volume reduction of 50% by week 8 Date Initiated: 03/28/2016 Goal Status: Active Ulcer/skin breakdown will have a volume reduction of 80% by week 12 Date Initiated: 03/28/2016 Goal Status: Active Interventions: Assess patient/caregiver ability to obtain necessary supplies Assess ulceration(s) every visit Notes: Electronic Signature(s) Signed: 03/29/2016 4:28:40 PM By: Alric Quan Entered By: Alric Quan on 03/28/2016 17:04:51 Tonkinson, [name] ([id number]) -------------------------------------------------------------------------------- Pain Assessment Details Patient Name: Fetting, Zayvier L. Date of Service: 03/28/2016 9:30 AM Medical Record Number: [id number] Patient Account Number: [id number] Date of Birth/Sex: Jun 21, 1951 (65 y.o. Male) Treating RN: Ahmed Prima Primary Care Physician: Starlyn Skeans Other Clinician: Referring Physician: Buddy Duty Treating Physician/Extender: Frann Rider in Treatment: 0 Active Problems Location of Pain Severity and Description of Pain Patient Has Paino No Site Locations Pain Management and Medication Current Pain Management: Electronic Signature(s) Signed: 03/29/2016 4:28:40 PM By: Alric Quan Entered By: Alric Quan on 03/28/2016 09:22:18 [place], [name] ([id number]) -------------------------------------------------------------------------------- Patient/Caregiver Education Details Patient Name: Fines, Calvert L. Date of Service: 03/28/2016 9:30 AM Medical Record Number: [id number] Patient Account Number: [id number] Date of Birth/Gender: Apr 08, 1951 (65 y.o. Male) Treating RN: Ahmed Prima Primary Care Physician: Starlyn Skeans Other Clinician: Referring Physician: Buddy Duty Treating Physician/Extender: Frann Rider in Treatment: 0 Education Assessment Education Provided To: Patient Education Topics Provided Wound/Skin Impairment:  Handouts: Other: change dressing as ordered Methods: Demonstration, Explain/Verbal Responses: State content correctly Electronic Signature(s) Signed: 03/29/2016 4:28:40 PM By: Alric Quan Entered By: Alric Quan on 03/28/2016 11:14:01 Nealy, [name] ([id number]) -------------------------------------------------------------------------------- Wound Assessment Details Patient Name: Wheless, Loic L. Date of Service: 03/28/2016 9:30 AM Medical Record Number: [id number] Patient Account Number: [id number] Date of Birth/Sex: Mar 10, 1951 (65 y.o. Male) Treating RN: Carolyne Fiscal, Debi Primary Care Physician: Starlyn Skeans Other Clinician: Referring Physician: Buddy Duty Treating Physician/Extender: Frann Rider in Treatment: 0 Wound Status Wound Number: 1 Primary Open Surgical Wound Etiology: Wound Location: Left Metatarsal head fourth Wound Open Wounding Event: Surgical Injury Status: Date Acquired: 02/26/2016 Comorbid Cataracts, Arrhythmia, Congestive Weeks Of Treatment: 0 History: Heart Failure, Hypertension, Type II Clustered Wound: No Diabetes, Rheumatoid Arthritis, Neuropathy Photos Photo Uploaded By: Alric Quan on 03/29/2016 14:57:59 Wound Measurements Length: (cm) 2.1 Width: (cm) 0.4 Depth: (cm) 0.1 Area: (cm) 0.66 Volume: (cm) 0.066 % Reduction in Area: % Reduction in Volume: Epithelialization: None Tunneling: No Undermining: No Wound Description Classification: Partial Thickness Foul Odor A Diabetic Severity (Wagner): Grade 1 Wound Margin: Flat and Intact Exudate Amount: Large Exudate Type: Serous Exudate Color: amber fter Cleansing: No Wound Bed Granulation Amount: None Present (0%) Exposed Structure Necrotic Amount: Large (67-100%) Fascia Exposed: No Aldridge,  Ryker L. ([id number]) Necrotic Quality: Adherent Slough Fat Layer Exposed: No Tendon Exposed: No Muscle Exposed: No Joint Exposed: No Bone Exposed: No Limited to Skin Breakdown Periwound Skin Texture Texture Color No Abnormalities Noted: No No Abnormalities Noted: No Localized Edema: Yes Temperature / Pain Moisture Temperature: No Abnormality No Abnormalities Noted: No Tenderness on Palpation: Yes Moist: Yes Wound Preparation Ulcer Cleansing: Rinsed/Irrigated with Saline Topical Anesthetic Applied: Other: lidocaine 4%, Treatment Notes Wound #1 (Left Metatarsal head fourth) 1. Cleansed with: Clean wound with Normal Saline 2. Anesthetic Topical Lidocaine 4% cream to wound bed prior to debridement 3. Peri-wound Care: Skin Prep 4. Dressing Applied: Other dressing (specify in notes) 5. Secondary Dressing Applied Dry Gauze 7. Secured with Tape Notes hydrogel with silver, foam to the deep tissues injuries Electronic Signature(s) Signed: 03/29/2016 4:28:40 PM By: Alric Quan Entered By: Alric Quan on 03/28/2016 10:16:38 Kral, [name] ([id number]) -------------------------------------------------------------------------------- Vitals Details Patient Name: Lynde, Jackson L. Date of Service: 03/28/2016 9:30 AM Medical Record Number: [id number] Patient Account Number: [id number] Date of Birth/Sex: 1951/06/18 (65 y.o. Male) Treating RN: Ahmed Prima Primary Care Physician: Starlyn Skeans Other Clinician: Referring Physician: Buddy Duty Treating Physician/Extender: Frann Rider in Treatment: 0 Vital Signs Time Taken: 09:26 Temperature (F): 97.5 Height (in): 69 Pulse (bpm): 66 Source: Stated Respiratory Rate (breaths/min): 18 Blood Pressure (mmHg): 118/58 Reference Range: 80 - 120 mg / dl Electronic Signature(s) Signed: 03/29/2016 4:28:40 PM By: Alric Quan Entered By: Alric Quan on 03/28/2016 09:26:17

## 2016-03-30 NOTE — Progress Notes (Addendum)
[name] ([id number]) Visit Report for 03/28/2016 Chief Complaint Document Details Patient Name: [name], [name] Date of Service: 03/28/2016 9:30 AM Medical Record Number: [id number] Patient Account Number: [id number] Date of Birth/Sex: [birth year] (65 y.o. Male) Treating RN: Ahmed Prima Primary Care Physician: Starlyn Skeans Other Clinician: Referring Physician: Buddy Duty Treating Physician/Extender: Frann Rider in Treatment: 0 Information Obtained from: Patient Chief Complaint Patients presents for treatment of an open diabetic ulcer to the left foot with a nonhealing surgical wound for about a month and a half. Electronic Signature(s) Signed: 03/28/2016 11:21:28 AM By: Christin Fudge MD, FACS Entered By: Christin Fudge on 03/28/2016 11:21:27 [name] ([id number]) -------------------------------------------------------------------------------- Debridement Details Patient Name: [name], [name] Date of Service: 03/28/2016 9:30 AM Medical Record Number: [id number] Patient Account Number: [id number] Date of Birth/Sex: Jul 21, 1951 (65 y.o. Male) Treating RN: Carolyne Fiscal, Debi Primary Care Physician: Starlyn Skeans Other Clinician: Referring Physician: Buddy Duty Treating Physician/Extender: Frann Rider in Treatment: 0 Debridement Performed for Wound #1 Left Metatarsal head fourth Assessment: Performed By: Physician Christin Fudge, MD Debridement: Debridement Pre-procedure Yes Verification/Time Out Taken: Start Time: 10:37 Pain Control: Other : lidocaine 4% cream Level: Skin/Subcutaneous Tissue Total Area Debrided (L x 2.1 (cm) x 0.4 (cm) = 0.84 (cm) W): Tissue and other Viable, Non-Viable, Exudate, Fibrin/Slough, Subcutaneous material debrided: Instrument: Forceps, Scissors Bleeding: Minimum Hemostasis Achieved: Pressure End Time: 10:42 Procedural Pain: 0 Post Procedural Pain: 0 Response to Treatment: Procedure was tolerated well Post  Debridement Measurements of Total Wound Length: (cm) 2.1 Width: (cm) 0.4 Depth: (cm) 0.2 Volume: (cm) 0.132 Post Procedure Diagnosis Same as Pre-procedure Electronic Signature(s) Signed: 03/28/2016 11:20:59 AM By: Christin Fudge MD, FACS Signed: 03/29/2016 4:28:40 PM By: Alric Quan Entered By: Christin Fudge on 03/28/2016 11:20:59 [name] ([id number]) -------------------------------------------------------------------------------- HPI Details Patient Name: [name], [name] Date of Service: 03/28/2016 9:30 AM Medical Record Number: [id number] Patient Account Number: [id number] Date of Birth/Sex: 11/12/1951 (65 y.o. Male) Treating RN: Ahmed Prima Primary Care Physician: Starlyn Skeans Other Clinician: Referring Physician: Buddy Duty Treating Physician/Extender: Frann Rider in Treatment: 0 History of Present Illness Location: wound in the region of the previous surgery where he's had a ray amputation of his left fourth toe Quality: Patient reports experiencing a dull pain to affected area(s). Severity: Patient states wound are getting worse. Duration: Patient has had the wound for > 2 months prior to seeking treatment at the wound center Timing: Pain in wound is Intermittent (comes and goes Context: The wound occurred when the patient was found to have an osteomyelitis of the left fourth toe and had surgery performed at [facility] Modifying Factors: Consults to this date include:angiography and podiatry at [facility] Signs and Symptoms: Patient reports having increase discharge. HPI Description: This 65 year old gentleman was referred to as by Dr. Buddy Duty from [facility], and has been treated there for a diabetic ulcer of the left foot associated with peripheral arterial disease and osteomyelitis due to a history of Wagner grade 3 infection of the left foot. Patient was on oral doxycycline and debridement was done at the location of the  left toe surgical end dictation side. He has been awaiting authorization of hyperbaric oxygen therapy and Regranex. Since he lives closer to our wound center he was referred to [place] for the possibility of hyperbaric oxygen therapy. [name] problem list includes diabetes mellitus type 2, essential hypertension, hammertoe, rheumatoid arthritis, cellulitis and abscess of the toe, current data disease, chronic kidney disease stage III,  systolic heart failure, pancytopenia. 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 Status post left fourth toe ray amputation without evidence of osteomyelitis. The patient also had an arteriogram of the left lower extremity done on 02/09/2016 for previous arterial duplex showing no flow in the PTA and dampened flow in the ATA. The right great toe pressure was 40 mmHg and the left great toe pressure was 38 mmHg. Final impression was runoff disease with single- vessel runoff to the ankle not available to endovascular revascularization. MRI of the foot done on 11/30/2016 showed left fourth toe findings consistent with osteomyelitis involving the distal aspect of the fourth proximal phalanx with osteitis of the fused fourth middle and distal phalanx. There was also diffuse soft tissue swelling of the forefoot without discrete abscess. Electronic Signature(s) Signed: 03/28/2016 11:24:38 AM By: Christin Fudge MD, FACS Entered By: Christin Fudge on 03/28/2016 11:24:37 [name] ([id number]) -------------------------------------------------------------------------------- Physical Exam Details Patient Name: [name], [name] Date of Service:  03/28/2016 9:30 AM Medical Record Number: [id number] Patient Account Number: [id number] Date of Birth/Sex: [birth year] (65 y.o. Male) Treating RN: Ahmed Prima Primary Care Physician: Starlyn Skeans Other Clinician: Referring Physician: Buddy Duty Treating Physician/Extender: Frann Rider in Treatment: 0 Constitutional . Pulse regular. Respirations normal and unlabored. Afebrile. . Eyes Nonicteric. Reactive to light. Ears, Nose, Mouth, and Throat Lips, teeth, and gums WNL.[name] Moist mucosa without lesions. Neck supple and nontender. No palpable supraclavicular or cervical adenopathy. Normal sized without goiter. Respiratory WNL. No retractions.. Cardiovascular Pedal Pulses WNL ABI on the left foot was 1.02 and the right was noncompressible.. No clubbing, cyanosis or edema. Gastrointestinal (GI) Abdomen without masses or tenderness.. No liver or spleen enlargement or tenderness.. Lymphatic No adneopathy. No adenopathy. No adenopathy. Musculoskeletal Adexa without tenderness or enlargement.. Digits and nails w/o clubbing, cyanosis, infection, petechiae, ischemia, or inflammatory conditions.. Integumentary (Hair, Skin) No suspicious lesions. No crepitus or fluctuance. No peri-wound warmth or erythema. No masses.[name] Psychiatric Judgement and insight Intact.. No evidence of depression, anxiety, or agitation.. Notes the wound in the region of the left forefoot where the healing scar of a ray amputation of the fourth toe has couple of areas which are open with subcutaneous debris and I have appropriately debrided this with forceps and scissors and there was no bleeding. Electronic Signature(s) Signed: 03/28/2016 11:25:41 AM By: Christin Fudge MD, FACS Entered By: Christin Fudge on 03/28/2016 11:25:39 [name] ([id number]) -------------------------------------------------------------------------------- Physician Orders Details Patient Name: [name], [name] Date of Service:  03/28/2016 9:30 AM Medical Record Number: [id number] Patient Account Number: [id number] Date of Birth/Sex: Mar 08, 1951 (65 y.o. Male) Treating RN: Carolyne Fiscal, Debi Primary Care Physician: Starlyn Skeans Other Clinician: Referring Physician: Buddy Duty Treating Physician/Extender: Frann Rider in Treatment: 0 Verbal / Phone Orders: Yes ClinicianCarolyne Fiscal, Debi Read Back and Verified: Yes Diagnosis Coding ICD-10 Coding Code Description E11.621 Type 2 diabetes mellitus with foot ulcer L97.522 Non-pressure chronic ulcer of other part of left foot with fat layer exposed I73.9 Peripheral vascular disease, unspecified I70.245 Atherosclerosis of native arteries of left leg with ulceration of other part of foot Wound Cleansing Wound #1 Left Metatarsal head fourth o Clean wound with Normal Saline. Anesthetic Wound #1 Left Metatarsal head fourth o Topical  Lidocaine 4% cream applied to wound bed prior to debridement Skin Barriers/Peri-Wound Care Wound #1 Left Metatarsal head fourth o Skin Prep Primary Wound Dressing Wound #1 Left Metatarsal head fourth o Hydrogel - with silver o Foam - ******only to the deep tissue injuries****** Secondary Dressing Wound #1 Left Metatarsal head fourth o Dry Gauze - tape o Conform/Kerlix Dressing Change Frequency Wound #1 Left Metatarsal head fourth o Change dressing every day. Follow-up Appointments LANIE, JACINTO ([id number]) Wound #1 Left Metatarsal head fourth o Return Appointment in 1 week. Edema Control Wound #1 Left Metatarsal head fourth o Elevate legs to the level of the heart and pump ankles as often as possible Radiology o X-ray, Chest - [facility] [employer] o EKG - [facility] Electronic Signature(s) Signed: 03/29/2016 4:28:40 PM By: Alric Quan Signed: 03/30/2016 7:50:46 AM By: Christin Fudge MD, FACS Previous Signature: 03/28/2016 3:32:20 PM Version By: Christin Fudge MD, FACS Entered By:  Alric Quan on 03/28/2016 17:31:29 Sotomayor, [name] ([id number]) -------------------------------------------------------------------------------- Problem List Details Patient Name: [name], [name] Date of Service: 03/28/2016 9:30 AM Medical Record Number: [id number] Patient Account Number: [id number] Date of Birth/Sex: 01-15-1951 (65 y.o. Male) Treating RN: Ahmed Prima Primary Care Physician: Starlyn Skeans Other Clinician: Referring Physician: Buddy Duty Treating Physician/Extender: Frann Rider in Treatment: 0 Active Problems ICD-10 Encounter Code Description Active Date Diagnosis E11.621 Type 2 diabetes mellitus with foot ulcer 03/28/2016 Yes L97.522 Non-pressure chronic ulcer of other part of left foot with fat 03/28/2016 Yes layer exposed I73.9 Peripheral vascular disease, unspecified 03/28/2016 Yes I70.245 Atherosclerosis of native arteries of left leg with ulceration 03/28/2016 Yes of other part of foot Inactive Problems Resolved Problems Electronic Signature(s) Signed: 03/28/2016 11:20:44 AM By: Christin Fudge MD, FACS Previous Signature: 03/28/2016 10:04:42 AM Version By: Christin Fudge MD, FACS Entered By: Christin Fudge on 03/28/2016 11:20:44 Donatelli, [name] ([id number]) -------------------------------------------------------------------------------- Progress Note Details Patient Name: [name], [name] Date of Service: 03/28/2016 9:30 AM Medical Record Number: [id number] Patient Account Number: [id number] Date of Birth/Sex: July 28, 1951 (65 y.o. Male) Treating RN: Ahmed Prima Primary Care Physician: Starlyn Skeans Other Clinician: Referring Physician: Buddy Duty Treating Physician/Extender: Frann Rider in Treatment: 0 Subjective Chief Complaint Information obtained from Patient Patients presents for treatment of an open diabetic ulcer to the left foot with a nonhealing surgical wound for about a month and a half. History of Present Illness (HPI) The  following HPI elements were documented for the patient's wound: Location: wound in the region of the previous surgery where he's had a ray amputation of his left fourth toe Quality: Patient reports experiencing a dull pain to affected area(s). Severity: Patient states wound are getting worse. Duration: Patient has had the wound for > 2 months prior to seeking treatment at the wound center Timing: Pain in wound is Intermittent (comes and goes Context: The wound occurred when the patient was found to have an osteomyelitis of the left fourth toe and had surgery performed at [facility] Modifying Factors: Consults to this date include:angiography and podiatry at [facility] Signs and Symptoms: Patient reports having increase discharge. This 65 year old gentleman was referred to as by Dr. Buddy Duty from [facility] and has been treated there for a diabetic ulcer of the left foot associated with peripheral arterial disease and osteomyelitis due to a history of Wagner grade 3 infection of the left foot. Patient was on oral doxycycline and debridement was done at the location of the left toe surgical end dictation side. He  has been awaiting authorization of hyperbaric oxygen therapy and Regranex. Since he lives closer to our wound center he was referred to [place] for the possibility of hyperbaric oxygen therapy. Patient s problem list includes diabetes mellitus type 2, essential hypertension, hammertoe, rheumatoid arthritis, cellulitis and abscess of the toe, current data disease, chronic kidney disease stage III, systolic heart failure, pancytopenia. Past surgical history significant for amputation of the right toe in 2014, right second toe amputation, coronary angioplasty, current artery bypass graft, recent left fourth toe ray amputation X-ray of the left foot done on 02/27/2016 shows no radiographic evidence for osteomyelitis no dissecting soft tissue gas and stable erosive changes  suggestive of inflammatory arthropathy. Status post left fourth toe ray amputation without evidence of osteomyelitis. The patient also had an arteriogram of the left lower extremity done on 02/09/2016 for previous arterial duplex showing no flow in the PTA and dampened flow in the ATA. The right great toe pressure was 40 mmHg and the left great toe pressure was 38 mmHg. Final impression was runoff disease with single- vessel runoff to the ankle not available to endovascular revascularization. MRI of the foot done on 11/30/2016 showed left fourth toe findings consistent with osteomyelitis involving the distal aspect of the fourth proximal phalanx with osteitis of the fused fourth middle and distal phalanx. There was also diffuse soft tissue swelling of the forefoot without discrete abscess. [name], RC SCHIRALDI ([id number]) Wound History Patient presents with 4 open wounds that have been present for approximately 12/20/15. Patient has been treating wounds in the following manner: hydrogel with silver. Laboratory tests have not been performed in the last month. Patient reportedly has not tested positive for an antibiotic resistant organism. Patient reportedly has tested positive for osteomyelitis. Patient reportedly has had testing performed to evaluate circulation in the legs. Patient experiences the following problems associated with their wounds: swelling. Patient History Information obtained from Patient. Allergies sulfer (Severity: Severe, Reaction: rash) Family History Cancer - Siblings, Child, Diabetes - Father, Siblings, Hypertension - Mother, No family history of Hereditary Spherocytosis, Kidney Disease, Lung Disease, Seizures, Stroke, Thyroid Problems, Tuberculosis. Social History Never smoker, Marital Status - Married, Alcohol Use - Never, Drug Use - No History, Caffeine Use - Rarely. Medical History Eyes Patient has history of Cataracts Cardiovascular Patient has history of  Arrhythmia - A-FIB, bradycardia, Congestive Heart Failure, Hypertension Endocrine Patient has history of Type II Diabetes Musculoskeletal Patient has history of Rheumatoid Arthritis Neurologic Patient has history of Neuropathy Patient is treated with Insulin. Blood sugar is tested. Medical And Surgical History Notes Eyes retinal edema diabetic macular edema diabetic retinopathy Hematologic/Lymphatic pantocytopenia Cardiovascular Prolonged QT hypercholesterolemia coronary atherosclerosis Gastrointestinal constipation Integumentary (Skin) cellulitis of toe Review of Systems (ROS) [place], [place] ([id number]) Constitutional Symptoms (General Health) The patient has no complaints or symptoms. Eyes Complains or has symptoms of Glasses / Contacts - glasses. Ear/Nose/Mouth/Throat The patient has no complaints or symptoms. Hematologic/Lymphatic The patient has no complaints or symptoms. Respiratory The patient has no complaints or symptoms. Genitourinary The patient has no complaints or symptoms. Immunological The patient has no complaints or symptoms. Integumentary (Skin) Complains or has symptoms of Wounds. Musculoskeletal Complains or has symptoms of Muscle Weakness. Oncologic The patient has no complaints or symptoms. Psychiatric The patient has no complaints or symptoms. Medications Lasix oral oral as directed lisinopril oral oral as directed metoprolol succinate oral oral as directed aspirin 81 mg tablet,delayed release oral 1 1 tablet,delayed release (DR/EC) oral daily methadone 10 mg tablet oral 1  1 tablet oral four times daily methadone 5 mg tablet oral 1 1 tablet oral every six hours as needed Lipitor 80 mg tablet oral one half tablet oral (40 mg.) daily Flomax 0.4 mg capsule oral 1 1 capsule,extended release 24hr oral prednisone 5 mg tablet oral tablet oral as directed Novolog Mix 70-30 100 unit/mL subcutaneous solution subcutaneous solution subcutaneous inject  12 units in the morning and 10 units in the evening amitriptyline 25 mg tablet oral 1 1 tablet oral nightly Objective Constitutional Pulse regular. Respirations normal and unlabored. Afebrile. [name], [name] DERENNE ([id number]) Vitals Time Taken: 9:26 AM, Height: 69 in, Source: Stated, Temperature: 97.5 F, Pulse: 66 bpm, Respiratory Rate: 18 breaths/min, Blood Pressure: 118/58 mmHg. Eyes Nonicteric. Reactive to light. Ears, Nose, Mouth, and Throat Lips, teeth, and gums WNL.[name] Moist mucosa without lesions. Neck supple and nontender. No palpable supraclavicular or cervical adenopathy. Normal sized without goiter. Respiratory WNL. No retractions.. Cardiovascular Pedal Pulses WNL ABI on the left foot was 1.02 and the right was noncompressible.. No clubbing, cyanosis or edema. Gastrointestinal (GI) Abdomen without masses or tenderness.. No liver or spleen enlargement or tenderness.. Lymphatic No adneopathy. No adenopathy. No adenopathy. Musculoskeletal Adexa without tenderness or enlargement.. Digits and nails w/o clubbing, cyanosis, infection, petechiae, ischemia, or inflammatory conditions.[name] Psychiatric Judgement and insight Intact.. No evidence of depression, anxiety, or agitation.. General Notes: the wound in the region of the left forefoot where the healing scar of a ray amputation of the fourth toe has couple of areas which are open with subcutaneous debris and I have appropriately debrided this with forceps and scissors and there was no bleeding. Integumentary (Hair, Skin) No suspicious lesions. No crepitus or fluctuance. No peri-wound warmth or erythema. No masses.. Wound #1 status is Open. Original cause of wound was Surgical Injury. The wound is located on the Left Metatarsal head fourth. The wound measures 2.1cm length x 0.4cm width x 0.1cm depth; 0.66cm^2 area and 0.066cm^3 volume. The wound is limited to skin breakdown. There is no tunneling or undermining noted. There is a  large amount of serous drainage noted. The wound margin is flat and intact. There is no granulation within the wound bed. There is a large (67-100%) amount of necrotic tissue within the wound bed including Adherent Slough. The periwound skin appearance exhibited: Localized Edema, Moist. Periwound temperature was noted as No Abnormality. The periwound has tenderness on palpation. [name], [name] ([id number]) Assessment Active Problems ICD-10 E11.621 - Type 2 diabetes mellitus with foot ulcer L97.522 - Non-pressure chronic ulcer of other part of left foot with fat layer exposed I73.9 - Peripheral vascular disease, unspecified I70.245 - Atherosclerosis of native arteries of left leg with ulceration of other part of foot This 65 year old gentleman who has a Wagner grade 3 diabetic foot ulcer in the region of a nonhealing wound, where his previous left fourth toe ray amputation was done for an osteomyelitis, also has a setting of peripheral vascular disease which has no options of revascularization. He's had excellent workup and care at [facility] and at this stage I believe he would benefit from hyperbaric oxygen therapy to help heal this wound which has been treated appropriately for about 2 months. I have recommended: 1. Continuing local care with silver hydrocolloid and an appropriate bandage 2. Off loading this foot as much as possible 3. Appropriate nutrition and vitamin supplements including vitamin A, vitamin C, zinc 4. Review of his hemoglobin A1c if not recently done. 5. Get his chest x-ray and EKG reports  from [facility]. 6. Regular views at the wound center and also follow-up with Dr. Tammy Sours his podiatrist Procedures Wound #1 Wound #1 is an Open Surgical Wound located on the Left Metatarsal head fourth . There was a Skin/Subcutaneous Tissue Debridement [id number]) debridement with total area of 0.84 sq cm performed by Christin Fudge, MD. with the following  instrument(s): Forceps and Scissors to remove Viable and Non-Viable tissue/material including Exudate, Fibrin/Slough, and Subcutaneous after achieving pain control using Other (lidocaine 4% cream). A time out was conducted prior to the start of the procedure. A Minimum amount of bleeding was controlled with Pressure. The procedure was tolerated well with a pain level of 0 throughout and a pain level of 0 following the procedure. Post Debridement Measurements: 2.1cm length x 0.4cm width x 0.2cm depth; 0.132cm^3 volume. Post procedure Diagnosis Wound #1: Same as Pre-Procedure [name], [name] ([id number]) Plan Wound Cleansing: Wound #1 Left Metatarsal head fourth: Clean wound with Normal Saline. Anesthetic: Wound #1 Left Metatarsal head fourth: Topical Lidocaine 4% cream applied to wound bed prior to debridement Skin Barriers/Peri-Wound Care: Wound #1 Left Metatarsal head fourth: Skin Prep Primary Wound Dressing: Wound #1 Left Metatarsal head fourth: Hydrogel - with silver Foam - ******only to the deep tissue injuries****** Secondary Dressing: Wound #1 Left Metatarsal head fourth: Dry Gauze - tape Conform/Kerlix Dressing Change Frequency: Wound #1 Left Metatarsal head fourth: Change dressing every day. Follow-up Appointments: Wound #1 Left Metatarsal head fourth: Return Appointment in 1 week. Edema Control: Wound #1 Left Metatarsal head fourth: Elevate legs to the level of the heart and pump ankles as often as possible Radiology ordered were: X-ray, Chest - [facility] ordered were: EKG - [facility] This 65 year old gentleman who has a Wagner grade 3 diabetic foot ulcer in the region of a nonhealing wound, where his previous left fourth toe ray amputation was done for an osteomyelitis, also has a setting of peripheral vascular disease which has no options of revascularization. He's had excellent workup and care at [facility] and at this stage I believe he would benefit  from hyperbaric oxygen therapy to help heal this wound which has been treated appropriately for about 2 months. Wieting, Damaso L. ([id number]) I have recommended: 1. Continuing local care with silver hydrocolloid and an appropriate bandage 2. Off loading this foot as much as possible 3. Appropriate nutrition and vitamin supplements including vitamin A, vitamin C, zinc 4. Review of his hemoglobin A1c if not recently done. 5. Get his chest x-ray and EKG reports from [facility]. 6. Regular views at the wound center and also follow-up with Dr. Tammy Sours his podiatrist Electronic Signature(s) Signed: 03/30/2016 7:53:07 AM By: Christin Fudge MD, FACS Previous Signature: 03/28/2016 11:29:21 AM Version By: Christin Fudge MD, FACS Entered By: Christin Fudge on 03/30/2016 07:53:07 [name] ([id number]) -------------------------------------------------------------------------------- ROS/PFSH Details Patient Name: Melka, Mattson L. Date of Service: 03/28/2016 9:30 AM Medical Record Number: [id number] Patient Account Number: [id number] Date of Birth/Sex: Dec 28, 1950 (65 y.o. Male) Treating RN: Ahmed Prima Primary Care Physician: Starlyn Skeans Other Clinician: Referring Physician: Buddy Duty Treating Physician/Extender: Frann Rider in Treatment: 0 Information Obtained From Patient Wound History Do you currently have one or more open woundso Yes How many open wounds do you currently haveo 4 Approximately how long have you had your woundso 12/20/15 How have you been treating your wound(s) until nowo hydrogel with silver Has your wound(s) ever healed and then re-openedo No Have you had any lab work done in  the past montho No Have you tested positive for an antibiotic resistant organism (MRSA, VRE)o No Have you tested positive for osteomyelitis (bone infection)o Yes Date: 02/26/2016 Have you had any tests for circulation on your legso Yes Who ordered the testo Dr. Tammy Sours Where was  the test [facility] Have you had other problems associated with your woundso Swelling Eyes Complaints and Symptoms: Positive for: Glasses / Contacts - glasses Medical History: Positive for: Cataracts Past Medical History Notes: retinal edema diabetic macular edema diabetic retinopathy Integumentary (Skin) Complaints and Symptoms: Positive for: Wounds Medical History: Past Medical History Notes: cellulitis of toe Musculoskeletal Complaints and Symptoms: Positive for: Muscle Weakness Haynesworth, Bernhard L. ([id number]) Medical History: Positive for: Rheumatoid Arthritis Constitutional Symptoms (General Health) Complaints and Symptoms: No Complaints or Symptoms Ear/Nose/Mouth/Throat Complaints and Symptoms: No Complaints or Symptoms Hematologic/Lymphatic Complaints and Symptoms: No Complaints or Symptoms Medical History: Past Medical History Notes: pantocytopenia Respiratory Complaints and Symptoms: No Complaints or Symptoms Cardiovascular Medical History: Positive for: Arrhythmia - A-FIB, bradycardia; Congestive Heart Failure; Hypertension Past Medical History Notes: Prolonged QT hypercholesterolemia coronary atherosclerosis Gastrointestinal Medical History: Past Medical History Notes: constipation Endocrine Medical History: Positive for: Type II Diabetes Time with diabetes: 2000 Treated with: Insulin Blood sugar tested every day: Yes Tested : 3xs Seyler, Levy L. ([id number]) Genitourinary Complaints and Symptoms: No Complaints or Symptoms Immunological Complaints and Symptoms: No Complaints or Symptoms Neurologic Medical History: Positive for: Neuropathy Oncologic Complaints and Symptoms: No Complaints or Symptoms Psychiatric Complaints and Symptoms: No Complaints or Symptoms HBO Extended History Items Eyes: Cataracts Family and Social History Cancer: Yes - Siblings, Child; Diabetes: Yes - Father, Siblings; Hereditary Spherocytosis:  No; Hypertension: Yes - Mother; Kidney Disease: No; Lung Disease: No; Seizures: No; Stroke: No; Thyroid Problems: No; Tuberculosis: No; Never smoker; Marital Status - Married; Alcohol Use: Never; Drug Use: No History; Caffeine Use: Rarely; Financial Concerns: No; Food, Clothing or Shelter Needs: No; Support System Lacking: No; Transportation Concerns: No; Advanced Directives: No; Patient does not want information on Advanced Directives; Do not resuscitate: No; Living Will: No; Medical Power of Attorney: No Physician Affirmation I have reviewed and agree with the above information. Electronic Signature(s) Signed: 03/28/2016 10:02:25 AM By: Christin Fudge MD, FACS Signed: 03/29/2016 4:28:40 PM By: Alric Quan Entered By: Christin Fudge on 03/28/2016 10:02:24 Krahl, [name] ([id number]) -------------------------------------------------------------------------------- SuperBill Details Patient Name: Kollmann, Zakhari L. Date of Service: 03/28/2016 Medical Record Number: [id number] Patient Account Number: [id number] Date of Birth/Sex: 09-Oct-1951 (65 y.o. Male) Treating RN: Carolyne Fiscal, Debi Primary Care Physician: Starlyn Skeans Other Clinician: Referring Physician: Buddy Duty Treating Physician/Extender: Frann Rider in Treatment: 0 Diagnosis Coding ICD-10 Codes Code Description E11.621 Type 2 diabetes mellitus with foot ulcer L97.522 Non-pressure chronic ulcer of other part of left foot with fat layer exposed I73.9 Peripheral vascular disease, unspecified I70.245 Atherosclerosis of native arteries of left leg with ulceration of other part of foot Facility Procedures CPT4: Description Modifier Quantity Code [id number] - DEB SUBQ TISSUE 20 SQ CM/< 1 ICD-10 Description Diagnosis E11.621 Type 2 diabetes mellitus with foot ulcer L97.522 Non-pressure chronic ulcer of other part of left foot with fat layer exposed  I73.9 Peripheral vascular disease, unspecified I70.245 Atherosclerosis of  native arteries of left leg with ulceration of other part of foot Physician Procedures CPT4: Description Modifier Quantity Code [id number] - WC PHYS LEVEL 4 - NEW PT 25 1 ICD-10 Description Diagnosis E11.621 Type 2 diabetes mellitus with foot ulcer L97.522 Non-pressure chronic ulcer of other part of  left foot with fat layer exposed  I73.9 Peripheral vascular disease, unspecified I70.245 Atherosclerosis of native arteries of left leg with ulceration of other part of foot CPT4: [id number] - WC PHYS SUBQ TISS 20 SQ CM 1 ICD-10 Description Diagnosis E11.621 Type 2 diabetes mellitus with foot ulcer Knisely, BRODIX SALVINO ([id number]) Electronic Signature(s) Signed: 03/29/2016 4:28:40 PM By: Alric Quan Signed: 03/30/2016 7:50:46 AM By: Christin Fudge MD, FACS Previous Signature: 03/28/2016 11:29:55 AM Version By: Christin Fudge MD, FACS Entered By: Alric Quan on 03/28/2016 17:05:43

## 2016-03-30 NOTE — Progress Notes (Signed)
RICHRD, DA ([id number]) Visit Report for 03/28/2016 Abuse/Suicide Risk Screen Details Patient Name: [name], [name]. Date of Service: 03/28/2016 9:30 AM Medical Record Number: [id number] Patient Account Number: [id number] Date of Birth/Sex: [birth year] (65 y.o. Male) Treating RN: Ahmed Prima Primary Care Physician: Starlyn Skeans Other Clinician: Referring Physician: Buddy Duty Treating Physician/Extender: Frann Rider in Treatment: 0 Abuse/Suicide Risk Screen Items Answer ABUSE/SUICIDE RISK SCREEN: Has anyone close to you tried to hurt or harm you recentlyo No Do you feel uncomfortable with anyone in your familyo No Has anyone forced you do things that you didnot want to doo No Do you have any thoughts of harming yourselfo No Patient displays signs or symptoms of abuse and/or neglect. No Electronic Signature(s) Signed: 03/29/2016 4:28:40 PM By: Alric Quan Entered By: Alric Quan on 03/28/2016 09:56:07 Shima, [name] ([id number]) -------------------------------------------------------------------------------- Activities of Daily Living Details Patient Name: [name], [name] Date of Service: 03/28/2016 9:30 AM Medical Record Number: [id number] Patient Account Number: [id number] Date of Birth/Sex: [birth year] (65 y.o. Male) Treating RN: Ahmed Prima Primary Care Physician: Starlyn Skeans Other Clinician: Referring Physician: Buddy Duty Treating Physician/Extender: Frann Rider in Treatment: 0 Activities of Daily Living Items Answer Activities of Daily Living (Please select one for each item) Drive Automobile Completely Able Take Medications Need Assistance Use Telephone Completely Able Care for Appearance Need Assistance Use Toilet Completely Able Bath / Shower Completely Able Dress Self Need Assistance Feed Self Completely Able Walk Need Assistance Get In / Out Bed Need Assistance Housework Need Assistance Prepare Meals Not Able Handle  Money Need Assistance Shop for Self Need Assistance Electronic Signature(s) Signed: 03/29/2016 4:28:40 PM By: Alric Quan Entered By: Alric Quan on 03/28/2016 09:59:06 Swarthout, [name] ([id number]) -------------------------------------------------------------------------------- Education Assessment Details Patient Name: [name], [name] Date of Service: 03/28/2016 9:30 AM Medical Record Number: [id number] Patient Account Number: [id number] Date of Birth/Sex: 08-16-1951 (65 y.o. Male) Treating RN: Ahmed Prima Primary Care Physician: Starlyn Skeans Other Clinician: Referring Physician: Buddy Duty Treating Physician/Extender: Frann Rider in Treatment: 0 Primary Learner Assessed: Patient Learning Preferences/Education Level/Primary Language Learning Preference: Explanation, Printed Material Highest Education Level: High School Preferred Language: English Cognitive Barrier Assessment/Beliefs Language Barrier: No Translator Needed: No Memory Deficit: No Emotional Barrier: No Cultural/Religious Beliefs Affecting Medical No Care: Physical Barrier Assessment Impaired Vision: Yes Glasses Impaired Hearing: No Decreased Hand dexterity: Yes Limitations: d/t RA Knowledge/Comprehension Assessment Knowledge Level: High Comprehension Level: High Ability to understand written High instructions: Motivation Assessment Anxiety Level: Calm Cooperation: Cooperative Education Importance: Acknowledges Need Interest in Health Problems: Asks Questions Perception: Coherent Willingness to Engage in Self- High Management Activities: Readiness to Engage in Self- High Management Activities: Electronic Signature(s) Signed: 03/29/2016 4:28:40 PM By: Alric Quan Achey, [name] ([id number]) Entered By: Alric Quan on 03/28/2016 09:59:42 Brege, [name] ([id number]) -------------------------------------------------------------------------------- Fall Risk Assessment  Details Patient Name: [name], [name] Date of Service: 03/28/2016 9:30 AM Medical Record Number: [id number] Patient Account Number: [id number] Date of Birth/Sex: May 25, 1951 (65 y.o. Male) Treating RN: Carolyne Fiscal, Debi Primary Care Physician: Starlyn Skeans Other Clinician: Referring Physician: Buddy Duty Treating Physician/Extender: Frann Rider in Treatment: 0 Fall Risk Assessment Items Have you had 2 or more falls in the last 12 monthso 0 No Have you had any fall that resulted in injury in the last 12 monthso 0 No FALL RISK ASSESSMENT: History of falling - immediate or within 3 months 0 No Secondary diagnosis 15 Yes Ambulatory aid None/bed rest/wheelchair/nurse 0 No Crutches/cane/walker 15 Yes Furniture 0 No  IV Access/Saline Lock 0 No Gait/Training Normal/bed rest/immobile 0 No Weak 10 Yes Impaired 20 Yes Mental Status Oriented to own ability 0 Yes Electronic Signature(s) Signed: 03/29/2016 4:28:40 PM By: Alric Quan Entered By: Alric Quan on 03/28/2016 10:00:22 Lim, [name] ([id number]) -------------------------------------------------------------------------------- Foot Assessment Details Patient Name: [name], [name] Date of Service: 03/28/2016 9:30 AM Medical Record Number: [id number] Patient Account Number: [id number] Date of Birth/Sex: [birth year] (65 y.o. Male) Treating RN: Ahmed Prima Primary Care Physician: Starlyn Skeans Other Clinician: Referring Physician: Buddy Duty Treating Physician/Extender: Frann Rider in Treatment: 0 Foot Assessment Items Site Locations + = Sensation present, - = Sensation absent, C = Callus, U = Ulcer R = Redness, W = Warmth, M = Maceration, PU = Pre-ulcerative lesion F = Fissure, S = Swelling, D = Dryness Assessment Right: Left: Other Deformity: No No Prior Foot Ulcer: No No Prior Amputation: No No Charcot Joint: No No Ambulatory Status: Ambulatory With Help Assistance Device: Cane Gait:  Steady Electronic Signature(s) Signed: 03/29/2016 4:28:40 PM By: Alric Quan Entered By: Alric Quan on 03/28/2016 10:06:32 Ayon, [name] ([id number]) -------------------------------------------------------------------------------- Nutrition Risk Assessment Details Patient Name: [name], [name] Date of Service: 03/28/2016 9:30 AM Medical Record Number: [id number] Patient Account Number: [id number] Date of Birth/Sex: [birth year] (66 y.o. Male) Treating RN: Ahmed Prima Primary Care Physician: Starlyn Skeans Other Clinician: Referring Physician: Buddy Duty Treating Physician/Extender: Frann Rider in Treatment: 0 Height (in): 69 Weight (lbs): Body Mass Index (BMI): Nutrition Risk Assessment Items NUTRITION RISK SCREEN: I have an illness or condition that made me change the kind and/or 2 Yes amount of food I eat I eat fewer than two meals per day 0 No I eat few fruits and vegetables, or milk products 0 No I have three or more drinks of beer, liquor or wine almost every day 0 No I have tooth or mouth problems that make it hard for me to eat 0 No I don't always have enough money to buy the food I need 0 No I eat alone most of the time 0 No I take three or more different prescribed or over-the-counter drugs a 1 Yes day Without wanting to, I have lost or gained 10 pounds in the last six 0 No months I am not always physically able to shop, cook and/or feed myself 2 Yes Nutrition Protocols Good Risk Protocol Moderate Risk Protocol Electronic Signature(s) Signed: 03/29/2016 4:28:40 PM By: Alric Quan Entered By: Alric Quan on 03/28/2016 10:00:45

## 2016-04-01 ENCOUNTER — Ambulatory Visit: Payer: Medicare Other

## 2016-04-01 DIAGNOSIS — E1169 Type 2 diabetes mellitus with other specified complication: Secondary | ICD-10-CM | POA: Diagnosis not present

## 2016-04-01 DIAGNOSIS — E1152 Type 2 diabetes mellitus with diabetic peripheral angiopathy with gangrene: Secondary | ICD-10-CM | POA: Diagnosis not present

## 2016-04-01 DIAGNOSIS — M869 Osteomyelitis, unspecified: Secondary | ICD-10-CM | POA: Diagnosis not present

## 2016-04-01 DIAGNOSIS — E11621 Type 2 diabetes mellitus with foot ulcer: Secondary | ICD-10-CM | POA: Diagnosis not present

## 2016-04-01 DIAGNOSIS — L97529 Non-pressure chronic ulcer of other part of left foot with unspecified severity: Secondary | ICD-10-CM | POA: Diagnosis not present

## 2016-04-04 ENCOUNTER — Ambulatory Visit
Admission: RE | Admit: 2016-04-04 | Discharge: 2016-04-04 | Disposition: A | Discharge status: Home / Self Care | Payer: Medicare Other | Source: Ambulatory Visit | Attending: Surgery | Admitting: Surgery

## 2016-04-04 ENCOUNTER — Encounter: Payer: Medicare Other | Admitting: Surgery

## 2016-04-04 ENCOUNTER — Other Ambulatory Visit: Payer: Self-pay | Admitting: Surgery

## 2016-04-04 DIAGNOSIS — I70245 Atherosclerosis of native arteries of left leg with ulceration of other part of foot: Secondary | ICD-10-CM | POA: Diagnosis not present

## 2016-04-04 DIAGNOSIS — R079 Chest pain, unspecified: Secondary | ICD-10-CM | POA: Diagnosis not present

## 2016-04-04 DIAGNOSIS — E11621 Type 2 diabetes mellitus with foot ulcer: Secondary | ICD-10-CM | POA: Diagnosis not present

## 2016-04-04 DIAGNOSIS — I739 Peripheral vascular disease, unspecified: Secondary | ICD-10-CM | POA: Diagnosis not present

## 2016-04-04 DIAGNOSIS — L97522 Non-pressure chronic ulcer of other part of left foot with fat layer exposed: Secondary | ICD-10-CM | POA: Diagnosis not present

## 2016-04-04 DIAGNOSIS — Z9889 Other specified postprocedural states: Secondary | ICD-10-CM | POA: Diagnosis not present

## 2016-04-04 DIAGNOSIS — Z9289 Personal history of other medical treatment: Secondary | ICD-10-CM

## 2016-04-04 DIAGNOSIS — T8131XA Disruption of external operation (surgical) wound, not elsewhere classified, initial encounter: Secondary | ICD-10-CM | POA: Diagnosis not present

## 2016-04-04 DIAGNOSIS — E1122 Type 2 diabetes mellitus with diabetic chronic kidney disease: Secondary | ICD-10-CM | POA: Diagnosis not present

## 2016-04-04 DIAGNOSIS — M069 Rheumatoid arthritis, unspecified: Secondary | ICD-10-CM | POA: Diagnosis not present

## 2016-04-05 NOTE — Progress Notes (Signed)
[name], [name] ([id number]) Visit Report for 04/04/2016 Arrival Information Details Patient Name: [name], [name]. Date of Service: 04/04/2016 1:30 PM Medical Record Number: [id number] Patient Account Number: [id number] Date of Birth/Sex: 05-22-1951 (65 y.o. Male) Treating RN: Ahmed Prima Primary Care Physician: Starlyn Skeans Other Clinician: Referring Physician: Starlyn Skeans Treating Physician/Extender: Frann Rider in Treatment: 1 Visit Information History Since Last Visit All ordered tests and consults were completed: No Patient Arrived: Wheel Chair Added or deleted any medications: No Arrival Time: 13:49 Any new allergies or adverse reactions: No Accompanied By: wife Had a fall or experienced change in No Transfer Assistance: [employer] activities of daily living that may affect Patient Lift risk of falls: Patient Identification Verified: Yes Signs or symptoms of abuse/neglect since last No Secondary Verification Process Yes visito Completed: Hospitalized since last visit: No Patient Requires Transmission- No Pain Present Now: No Based Precautions: Patient Has Alerts: Yes Patient Alerts: DM II Electronic Signature(s) Signed: 04/05/2016 4:14:54 PM By: Alric Quan Entered By: Alric Quan on 04/04/2016 13:50:16 Wax, [name] ([id number]) -------------------------------------------------------------------------------- Encounter Discharge Information Details Patient Name: [name], [name] Date of Service: 04/04/2016 1:30 PM Medical Record Number: [id number] Patient Account Number: [id number] Date of Birth/Sex: Oct 24, 1951 (65 y.o. Male) Treating RN: Ahmed Prima Primary Care Physician: Starlyn Skeans Other Clinician: Referring Physician: Starlyn Skeans Treating Physician/Extender: Frann Rider in Treatment: 1 Encounter Discharge Information Items Discharge Pain Level: 0 Discharge Condition: Stable Ambulatory Status: Wheelchair Discharge  Destination: Home Transportation: Private Auto Accompanied By: wife Schedule Follow-up Appointment: Yes Medication Reconciliation completed and provided to Patient/Care Yes Georgi Navarrete: Provided on Clinical Summary of Care: 04/04/2016 Form Type Recipient Paper Patient JP Electronic Signature(s) Signed: 04/04/2016 2:22:39 PM By: Ruthine Dose Entered By: Ruthine Dose on 04/04/2016 14:22:39 Czajkowski, [name] ([id number]) -------------------------------------------------------------------------------- Lower Extremity Assessment Details Patient Name: [name], [name] Date of Service: 04/04/2016 1:30 PM Medical Record Number: [id number] Patient Account Number: [id number] Date of Birth/Sex: 08/02/1951 (64 y.o. Male) Treating RN: Ahmed Prima Primary Care Physician: Starlyn Skeans Other Clinician: Referring Physician: Starlyn Skeans Treating Physician/Extender: Frann Rider in Treatment: 1 Vascular Assessment Pulses: Posterior Tibial Dorsalis Pedis Palpable: [Left:Yes] Extremity colors, hair growth, and conditions: Extremity Color: [Left:Normal] Temperature of Extremity: [Left:Warm] Capillary Refill: [Left:< 3 seconds] Toe Nail Assessment Left: Right: Thick: Yes Discolored: Yes Deformed: No Improper Length and Hygiene: Yes Electronic Signature(s) Signed: 04/05/2016 4:14:54 PM By: Alric Quan Entered By: Alric Quan on 04/04/2016 13:51:55 Fillingim, [name] ([id number]) -------------------------------------------------------------------------------- Multi Wound Chart Details Patient Name: [name], [name] Date of Service: 04/04/2016 1:30 PM Medical Record Number: [id number] Patient Account Number: [id number] Date of Birth/Sex: August 15, 1951 (65 y.o. Male) Treating RN: Carolyne Fiscal, Debi Primary Care Physician: Starlyn Skeans Other Clinician: Referring Physician: Starlyn Skeans Treating Physician/Extender: Frann Rider in Treatment: 1 Vital Signs Height(in):  69 Pulse(bpm): 68 Weight(lbs): Blood Pressure 116/54 (mmHg): Body Mass Index(BMI): Temperature(F): Respiratory Rate 18 (breaths/min): Photos: [1:No Photos] [N/A:N/A] Wound Location: [1:Left Metatarsal head fourth] [N/A:N/A] Wounding Event: [1:Surgical Injury] [N/A:N/A] Primary Etiology: [1:Open Surgical Wound] [N/A:N/A] Comorbid History: [1:Cataracts, Arrhythmia, Congestive Heart Failure, Hypertension, Type II Diabetes, Rheumatoid Arthritis, Neuropathy] [N/A:N/A] Date Acquired: [1:02/26/2016] [N/A:N/A] Weeks of Treatment: [1:1] [N/A:N/A] Wound Status: [1:Open] [N/A:N/A] Measurements L x W x D 2.4x0.5x0.2 [N/A:N/A] (cm) Area (cm) : [1:0.942] [N/A:N/A] Volume (cm) : [1:0.188] [N/A:N/A] % Reduction in Area: [1:-42.70%] [N/A:N/A] % Reduction in Volume: -184.80% [N/A:N/A] Classification: [1:Partial Thickness] [N/A:N/A] HBO Classification: [1:Grade 1] [N/A:N/A] Exudate Amount: [1:Large] [N/A:N/A] Exudate Type: [1:Serous] [N/A:N/A] Exudate Color: [1:amber] [N/A:N/A] Wound Margin: [  1:Flat and Intact] [N/A:N/A] Granulation Amount: [1:None Present (0%)] [N/A:N/A] Necrotic Amount: [1:Large (67-100%)] [N/A:N/A] Exposed Structures: [1:Fascia: No Fat: No Tendon: No] [N/A:N/A] Muscle: No Joint: No Bone: No Limited to Skin Breakdown Epithelialization: None N/A N/A Periwound Skin Texture: Edema: Yes N/A N/A Periwound Skin Moist: Yes N/A N/A Moisture: Periwound Skin Color: No Abnormalities Noted N/A N/A Temperature: No Abnormality N/A N/A Tenderness on Yes N/A N/A Palpation: Wound Preparation: Ulcer Cleansing: N/A N/A Rinsed/Irrigated with Saline Topical Anesthetic Applied: Other: lidocaine 4% Treatment Notes Electronic Signature(s) Signed: 04/05/2016 4:14:54 PM By: Alric Quan Entered By: Alric Quan on 04/04/2016 13:58:27 Gilroy, [name] ([id number]) -------------------------------------------------------------------------------- [place]  Details Patient Name: [name], [name] Date of Service: 04/04/2016 1:30 PM Medical Record Number: [id number] Patient Account Number: [id number] Date of Birth/Sex: [birth year] (65 y.o. Male) Treating RN: Ahmed Prima Primary Care Physician: Starlyn Skeans Other Clinician: Referring Physician: Starlyn Skeans Treating Physician/Extender: Frann Rider in Treatment: 1 Active Inactive Abuse / Safety / Falls / Self Care Management Nursing Diagnoses: Potential for falls Goals: Patient will remain injury free Date Initiated: 03/28/2016 Goal Status: Active Interventions: Assess fall risk on admission and as needed Notes: Orientation to the Wound Care Program Nursing Diagnoses: Knowledge deficit related to the wound healing center program Goals: Patient/caregiver will verbalize understanding of the [place] Program Date Initiated: 03/28/2016 Goal Status: Active Interventions: Provide education on orientation to the wound center Notes: Pain, Acute or Chronic Nursing Diagnoses: Pain, acute or chronic: actual or potential Potential alteration in comfort, pain Goals: Searing, Antonios L. ([id number]) Patient will verbalize adequate pain control and receive pain control interventions during procedures as needed Date Initiated: 03/28/2016 Goal Status: Active Interventions: Assess comfort goal upon admission Complete pain assessment as per visit requirements Notes: Wound/Skin Impairment Nursing Diagnoses: Impaired tissue integrity Goals: Ulcer/skin breakdown will have a volume reduction of 30% by week 4 Date Initiated: 03/28/2016 Goal Status: Active Ulcer/skin breakdown will have a volume reduction of 50% by week 8 Date Initiated: 03/28/2016 Goal Status: Active Ulcer/skin breakdown will have a volume reduction of 80% by week 12 Date Initiated: 03/28/2016 Goal Status: Active Interventions: Assess patient/caregiver ability to obtain necessary supplies Assess ulceration(s) every  visit Notes: Electronic Signature(s) Signed: 04/05/2016 4:14:54 PM By: Alric Quan Entered By: Alric Quan on 04/04/2016 13:58:20 Heng, [name] ([id number]) -------------------------------------------------------------------------------- Pain Assessment Details Patient Name: Bruna, Duy L. Date of Service: 04/04/2016 1:30 PM Medical Record Number: [id number] Patient Account Number: [id number] Date of Birth/Sex: [birth year] (65 y.o. Male) Treating RN: Ahmed Prima Primary Care Physician: Starlyn Skeans Other Clinician: Referring Physician: Starlyn Skeans Treating Physician/Extender: Frann Rider in Treatment: 1 Active Problems Location of Pain Severity and Description of Pain Patient Has Paino No Site Locations Pain Management and Medication Current Pain Management: Electronic Signature(s) Signed: 04/05/2016 4:14:54 PM By: Alric Quan Entered By: Alric Quan on 04/04/2016 13:50:23 Corprew, [name] ([id number]) -------------------------------------------------------------------------------- Patient/Caregiver Education Details Patient Name: Tavano, Kyden L. Date of Service: 04/04/2016 1:30 PM Medical Record Number: [id number] Patient Account Number: [id number] Date of Birth/Gender: 01-19-1951 (65 y.o. Male) Treating RN: Ahmed Prima Primary Care Physician: Starlyn Skeans Other Clinician: Referring Physician: Starlyn Skeans Treating Physician/Extender: Frann Rider in Treatment: 1 Education Assessment Education Provided To: Patient Education Topics Provided Wound/Skin Impairment: Handouts: Other: change dressing as ordered Methods: Demonstration, Explain/Verbal Responses: State content correctly Electronic Signature(s) Signed: 04/05/2016 4:14:54 PM By: Alric Quan Entered By: Alric Quan on 04/04/2016 14:22:30 Beam, [name] ([id number]) -------------------------------------------------------------------------------- Wound Assessment  Details Patient Name: Wiesman,  Janis L. Date of Service: 04/04/2016 1:30 PM Medical Record Number: [id number] Patient Account Number: [id number] Date of Birth/Sex: 06/01/1951 (65 y.o. Male) Treating RN: Carolyne Fiscal, Debi Primary Care Physician: Starlyn Skeans Other Clinician: Referring Physician: Starlyn Skeans Treating Physician/Extender: Frann Rider in Treatment: 1 Wound Status Wound Number: 1 Primary Open Surgical Wound Etiology: Wound Location: Left Metatarsal head fourth Wound Open Wounding Event: Surgical Injury Status: Date Acquired: 02/26/2016 Comorbid Cataracts, Arrhythmia, Congestive Weeks Of Treatment: 1 History: Heart Failure, Hypertension, Type II Clustered Wound: No Diabetes, Rheumatoid Arthritis, Neuropathy Photos Photo Uploaded By: Alric Quan on 04/05/2016 08:07:48 Wound Measurements Length: (cm) 2.4 Width: (cm) 0.5 Depth: (cm) 0.2 Area: (cm) 0.942 Volume: (cm) 0.188 % Reduction in Area: -42.7% % Reduction in Volume: -184.8% Epithelialization: None Tunneling: No Undermining: No Wound Description Classification: Partial Thickness Foul Odor A Diabetic Severity (Wagner): Grade 1 Wound Margin: Flat and Intact Exudate Amount: Large Exudate Type: Serous Exudate Color: amber fter Cleansing: No Wound Bed Granulation Amount: None Present (0%) Exposed Structure Necrotic Amount: Large (67-100%) Fascia Exposed: No Ebert, Armondo L. ([id number]) Necrotic Quality: Adherent Slough Fat Layer Exposed: No Tendon Exposed: No Muscle Exposed: No Joint Exposed: No Bone Exposed: No Limited to Skin Breakdown Periwound Skin Texture Texture Color No Abnormalities Noted: No No Abnormalities Noted: No Localized Edema: Yes Temperature / Pain Moisture Temperature: No Abnormality No Abnormalities Noted: No Tenderness on Palpation: Yes Moist: Yes Wound Preparation Ulcer Cleansing: Rinsed/Irrigated with Saline Topical Anesthetic Applied: Other: lidocaine  4%, Treatment Notes Wound #1 (Left Metatarsal head fourth) 1. Cleansed with: Clean wound with Normal Saline 2. Anesthetic Topical Lidocaine 4% cream to wound bed prior to debridement 4. Dressing Applied: Santyl Ointment Plain packing gauze 5. Secondary Dressing Applied Dry Gauze Foam Notes conform, foam to the deep tissues injuries Electronic Signature(s) Signed: 04/05/2016 4:14:54 PM By: Alric Quan Entered By: Alric Quan on 04/04/2016 13:56:13 Aschoff, [name] ([id number]) -------------------------------------------------------------------------------- Vitals Details Patient Name: Nippert, Jarad L. Date of Service: 04/04/2016 1:30 PM Medical Record Number: [id number] Patient Account Number: [id number] Date of Birth/Sex: 07-02-1951 (65 y.o. Male) Treating RN: Ahmed Prima Primary Care Physician: Starlyn Skeans Other Clinician: Referring Physician: Starlyn Skeans Treating Physician/Extender: Frann Rider in Treatment: 1 Vital Signs Time Taken: 13:50 Pulse (bpm): 68 Height (in): 69 Respiratory Rate (breaths/min): 18 Blood Pressure (mmHg): 116/54 Reference Range: 80 - 120 mg / dl Electronic Signature(s) Signed: 04/05/2016 4:14:54 PM By: Alric Quan Entered By: Alric Quan on 04/04/2016 13:50:43

## 2016-04-05 NOTE — Progress Notes (Signed)
MAKIH, [name] ([id number]) Visit Report for 04/04/2016 Chief Complaint Document Details Patient Name: [name], [name] Date of Service: 04/04/2016 1:30 PM Medical Record Number: [id number] Patient Account Number: [id number] Date of Birth/Sex: 12-Jan-1951 (65 y.o. Male) Treating RN: Ahmed Prima Primary Care Physician: Starlyn Skeans Other Clinician: Referring Physician: Starlyn Skeans Treating Physician/Extender: Frann Rider in Treatment: 1 Information Obtained from: Patient Chief Complaint Patients presents for treatment of an open diabetic ulcer to the left foot with a nonhealing surgical wound for about a month and a half. Electronic Signature(s) Signed: 04/04/2016 2:16:38 PM By: Christin Fudge MD, FACS Entered By: Christin Fudge on 04/04/2016 14:16:38 Swint, [name] ([id number]) -------------------------------------------------------------------------------- Debridement Details Patient Name: [name], [name] Date of Service: 04/04/2016 1:30 PM Medical Record Number: [id number] Patient Account Number: [id number] Date of Birth/Sex: 06-May-1951 (65 y.o. Male) Treating RN: Carolyne Fiscal, Debi Primary Care Physician: Starlyn Skeans Other Clinician: Referring Physician: Starlyn Skeans Treating Physician/Extender: Frann Rider in Treatment: 1 Debridement Performed for Wound #1 Left Metatarsal head fourth Assessment: Performed By: Physician Christin Fudge, MD Debridement: Debridement Pre-procedure Yes Verification/Time Out Taken: Start Time: 14:02 Pain Control: Other : lidocaine 4% cream Level: Skin/Subcutaneous Tissue Total Area Debrided (L x 2.4 (cm) x 0.5 (cm) = 1.2 (cm) W): Tissue and other Viable, Non-Viable, Exudate, Fibrin/Slough, Subcutaneous material debrided: Instrument: Curette Bleeding: Minimum Hemostasis Achieved: Pressure End Time: 14:05 Procedural Pain: 0 Post Procedural Pain: 0 Response to Treatment: Procedure was tolerated well Post Debridement  Measurements of Total Wound Length: (cm) 2.4 Width: (cm) 0.5 Depth: (cm) 0.5 Volume: (cm) 0.471 Post Procedure Diagnosis Same as Pre-procedure Notes after sharp debridement with a curette both these wounds communicate and there is a tunnel in the region where there was his fourth metatarsal. There is no purulence at the present time there is some necrotic debris which was sharply removed Electronic Signature(s) Signed: 04/04/2016 2:16:29 PM By: Christin Fudge MD, FACS Signed: 04/05/2016 4:14:54 PM By: Alric Quan Shull, [name] ([id number]) Entered By: Christin Fudge on 04/04/2016 14:16:29 Frison, [place] ([id number]) -------------------------------------------------------------------------------- HPI Details Patient Name: Kitson, [name] Date of Service: 04/04/2016 1:30 PM Medical Record Number: [id number] Patient Account Number: [id number] Date of Birth/Sex: 1951-02-06 (65 y.o. Male) Treating RN: Ahmed Prima Primary Care Physician: Starlyn Skeans Other Clinician: Referring Physician: Starlyn Skeans Treating Physician/Extender: Frann Rider in Treatment: 1 History of Present Illness Location: wound in the region of the previous surgery where he's had a ray amputation of his left fourth toe Quality: Patient reports experiencing a dull pain to affected area(s). Severity: Patient states wound are getting worse. Duration: Patient has had the wound for > 2 months prior to seeking treatment at the wound center Timing: Pain in wound is Intermittent (comes and goes Context: The wound occurred when the patient was found to have an osteomyelitis of the left fourth toe and had surgery performed at [facility] Modifying Factors: Consults to this date include:angiography and podiatry at [place] Signs and Symptoms: Patient reports having increase discharge. HPI Description: This 65 year old gentleman was referred to as by Dr. Buddy Duty from [facility], and has  been treated there for a diabetic ulcer of the left foot associated with peripheral arterial disease and osteomyelitis due to a history of Wagner grade 3 infection of the left foot. Patient was on oral doxycycline and debridement was done at the location of the left toe surgical end dictation side. He has been awaiting authorization of hyperbaric oxygen therapy and Regranex. Since he  lives closer to our wound center he was referred to [place] for the possibility of hyperbaric oxygen therapy. [name] problem list includes diabetes mellitus type 2, essential hypertension, hammertoe, rheumatoid arthritis, cellulitis and abscess of the toe, current data disease, chronic kidney disease stage III, systolic heart failure, pancytopenia. 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 Status post left fourth toe ray amputation without evidence of osteomyelitis. The patient also had an arteriogram of the left lower extremity done on 02/09/2016 for previous arterial duplex showing no flow in the PTA and dampened flow in the ATA. The right great toe pressure was 40 mmHg and the left great toe pressure was 38 mmHg. Final impression was runoff disease with single- vessel runoff to the ankle not available to endovascular revascularization. MRI of the foot done on 11/30/2016 showed left fourth toe findings consistent with osteomyelitis involving the distal aspect of the fourth proximal phalanx with osteitis of the fused fourth middle and distal phalanx. There was also diffuse soft tissue swelling of the forefoot without discrete abscess. Electronic Signature(s) Signed: 04/04/2016 2:17:00  PM By: Christin Fudge MD, FACS Entered By: Christin Fudge on 04/04/2016 14:17:00 Mcbean, [name] ([id number]) -------------------------------------------------------------------------------- Physical Exam Details Patient Name: Moncrieffe, Phat L. Date of Service: 04/04/2016 1:30 PM Medical Record Number: [id number] Patient Account Number: [id number] Date of Birth/Sex: February 27, 1951 (65 y.o. Male) Treating RN: Carolyne Fiscal, Debi Primary Care Physician: Starlyn Skeans Other Clinician: Referring Physician: Starlyn Skeans Treating Physician/Extender: Frann Rider in Treatment: 1 Constitutional . Pulse regular. Respirations normal and unlabored. Afebrile. . Eyes Nonicteric. Reactive to light. Ears, Nose, Mouth, and Throat Lips, teeth, and gums WNL.[name] Moist mucosa without lesions. Neck supple and nontender. No palpable supraclavicular or cervical adenopathy. Normal sized without goiter. Respiratory WNL. No retractions.. Breath sounds WNL, No rubs, rales, rhonchi, or wheeze.. Cardiovascular Heart rhythm and rate regular, no murmur or gallop.. Pedal Pulses WNL. No clubbing, cyanosis or edema. Lymphatic No adneopathy. No adenopathy. No adenopathy. Musculoskeletal Adexa without tenderness or enlargement.. Digits and nails w/o clubbing, cyanosis, infection, petechiae, ischemia, or inflammatory conditions.. Integumentary (Hair, Skin) No suspicious lesions. No crepitus or fluctuance. No peri-wound warmth or erythema. No masses.[name] Psychiatric Judgement and insight Intact.. No evidence of depression, anxiety, or agitation.. Notes the wound at the base of his fourth metatarsal has 2 openings and both of these can communicate and there is some necrotic debris which were sharply removed with a curette. There is no pus or cellulitis. Electronic Signature(s) Signed: 04/04/2016 2:18:42 PM By: Christin Fudge MD, FACS Entered By: Christin Fudge on 04/04/2016 14:18:41 Holsonback, [name]  ([id number]) -------------------------------------------------------------------------------- Physician Orders Details Patient Name: Rood, Sharron L. Date of Service: 04/04/2016 1:30 PM Medical Record Number: [id number] Patient Account Number: [id number] Date of Birth/Sex: [birth year] (65 y.o. Male) Treating RN: Carolyne Fiscal, Debi Primary Care Physician: Starlyn Skeans Other Clinician: Referring Physician: Starlyn Skeans Treating Physician/Extender: Frann Rider in Treatment: 1 Verbal / Phone Orders: Yes Clinician: Pinkerton, Debi Read Back and Verified: Yes Diagnosis Coding Wound Cleansing Wound #1 Left Metatarsal head fourth o Clean wound with Normal Saline. Anesthetic Wound #1 Left Metatarsal head fourth o Topical Lidocaine 4% cream applied to wound bed prior to debridement Skin Barriers/Peri-Wound Care Wound #1 Left Metatarsal head  fourth o Skin Prep Primary Wound Dressing Wound #1 Left Metatarsal head fourth o Santyl Ointment o Foam - ******only to the deep tissue injuries****** o Pack wound with: - 1/4 inch pain packing Secondary Dressing Wound #1 Left Metatarsal head fourth o Dry Gauze - tape, conform Dressing Change Frequency Wound #1 Left Metatarsal head fourth o Change dressing every day. Follow-up Appointments Wound #1 Left Metatarsal head fourth o Return Appointment in 1 week. Edema Control Wound #1 Left Metatarsal head fourth o Elevate legs to the level of the heart and pump ankles as often as possible Funchess, Kanoa L. ([id number]) Patient Medications Allergies: sulfer Notifications Medication Indication Start End Santyl 04/04/2016 DOSE topical 250 unit/gram ointment - ointment topical as directed Electronic Signature(s) Signed: 04/04/2016 2:25:52 PM By: Christin Fudge MD, FACS Entered By: Christin Fudge on 04/04/2016 14:25:51 Franssen, [name] ([id number]) -------------------------------------------------------------------------------- Problem  List Details Patient Name: Fass, Eden L. Date of Service: 04/04/2016 1:30 PM Medical Record Number: [id number] Patient Account Number: [id number] Date of Birth/Sex: 1951-02-13 (65 y.o. Male) Treating RN: Ahmed Prima Primary Care Physician: Starlyn Skeans Other Clinician: Referring Physician: Starlyn Skeans Treating Physician/Extender: Frann Rider in Treatment: 1 Active Problems ICD-10 Encounter Code Description Active Date Diagnosis E11.621 Type 2 diabetes mellitus with foot ulcer 03/28/2016 Yes L97.522 Non-pressure chronic ulcer of other part of left foot with fat 03/28/2016 Yes layer exposed I73.9 Peripheral vascular disease, unspecified 03/28/2016 Yes I70.245 Atherosclerosis of native arteries of left leg with ulceration 03/28/2016 Yes of other part of foot Inactive Problems Resolved Problems Electronic Signature(s) Signed: 04/04/2016 2:15:32 PM By: Christin Fudge MD, FACS Entered By: Christin Fudge on 04/04/2016 14:15:31 Gladu, [name] ([id number]) -------------------------------------------------------------------------------- Progress Note Details Patient Name: Bove, Horton L. Date of Service: 04/04/2016 1:30 PM Medical Record Number: [id number] Patient Account Number: [id number] Date of Birth/Sex: [birth year] (65 y.o. Male) Treating RN: Ahmed Prima Primary Care Physician: Starlyn Skeans Other Clinician: Referring Physician: Starlyn Skeans Treating Physician/Extender: Frann Rider in Treatment: 1 Subjective Chief Complaint Information obtained from Patient Patients presents for treatment of an open diabetic ulcer to the left foot with a nonhealing surgical wound for about a month and a half. History of Present Illness (HPI) The following HPI elements were documented for the patient's wound: Location: wound in the region of the previous surgery where he's had a ray amputation of his left fourth toe Quality: Patient reports experiencing a dull pain to affected  area(s). Severity: Patient states wound are getting worse. Duration: Patient has had the wound for > 2 months prior to seeking treatment at the wound center Timing: Pain in wound is Intermittent (comes and goes Context: The wound occurred when the patient was found to have an osteomyelitis of the left fourth toe and had surgery performed at [facility] Modifying Factors: Consults to this date include:angiography and podiatry at [place] Signs and Symptoms: Patient reports having increase discharge. This 65 year old gentleman was referred to as by Dr. Buddy Duty from [facility], and has been treated there for a diabetic ulcer of the left foot associated with peripheral arterial disease and osteomyelitis due to a history of Wagner grade 3 infection of the left foot. Patient was on oral doxycycline and debridement was done at the location of the left toe surgical end dictation side. He has been awaiting authorization of hyperbaric oxygen therapy and Regranex. Since he lives closer to our wound center he was referred to [place] for the possibility of hyperbaric oxygen therapy. Patient s problem list  includes diabetes mellitus type 2, essential hypertension, hammertoe, rheumatoid arthritis, cellulitis and abscess of the toe, current data disease, chronic kidney disease stage III, systolic heart failure, pancytopenia. 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 Status post left fourth toe ray amputation without evidence of osteomyelitis. The patient also had an arteriogram of the left lower extremity done on 02/09/2016 for previous arterial duplex  showing no flow in the PTA and dampened flow in the ATA. The right great toe pressure was 40 mmHg and the left great toe pressure was 38 mmHg. Final impression was runoff disease with single- vessel runoff to the ankle not available to endovascular revascularization. MRI of the foot done on 11/30/2016 showed left fourth toe findings consistent with osteomyelitis involving the distal aspect of the fourth proximal phalanx with osteitis of the fused fourth middle and distal phalanx. There was also diffuse soft tissue swelling of the forefoot without discrete abscess. Stander, [place] ([id number]) Objective Constitutional Pulse regular. Respirations normal and unlabored. Afebrile. Vitals Time Taken: 1:50 PM, Height: 69 in, Pulse: 68 bpm, Respiratory Rate: 18 breaths/min, Blood Pressure: 116/54 mmHg. Eyes Nonicteric. Reactive to light. Ears, Nose, Mouth, and Throat Lips, teeth, and gums WNL.[name] Moist mucosa without lesions. Neck supple and nontender. No palpable supraclavicular or cervical adenopathy. Normal sized without goiter. Respiratory WNL. No retractions.. Breath sounds WNL, No rubs, rales, rhonchi, or wheeze.. Cardiovascular Heart rhythm and rate regular, no murmur or gallop.. Pedal Pulses WNL. No clubbing, cyanosis or edema. Lymphatic No adneopathy. No adenopathy. No adenopathy. Musculoskeletal Adexa without tenderness or enlargement.. Digits and nails w/o clubbing, cyanosis, infection, petechiae, ischemia, or inflammatory conditions.[name] Psychiatric Judgement and insight Intact.. No evidence of depression, anxiety, or agitation.. General Notes: the wound at the base of his fourth metatarsal has 2 openings and both of these can communicate and there is some necrotic debris which were sharply removed with a curette. There is no pus or cellulitis. Integumentary (Hair, Skin) No suspicious lesions. No crepitus or fluctuance. No peri-wound warmth or erythema. No masses.. Wound #1 status is  Open. Original cause of wound was Surgical Injury. The wound is located on the Left Metatarsal head fourth. The wound measures 2.4cm length x 0.5cm width x 0.2cm depth; 0.942cm^2 area Scali, Chilton L. ([id number]) and 0.188cm^3 volume. The wound is limited to skin breakdown. There is no tunneling or undermining noted. There is a large amount of serous drainage noted. The wound margin is flat and intact. There is no granulation within the wound bed. There is a large (67-100%) amount of necrotic tissue within the wound bed including Adherent Slough. The periwound skin appearance exhibited: Localized Edema, Moist. Periwound temperature was noted as No Abnormality. The periwound has tenderness on palpation. Assessment Active Problems ICD-10 E11.621 - Type 2 diabetes mellitus with foot ulcer L97.522 - Non-pressure chronic ulcer of other part of left foot with fat layer exposed I73.9 - Peripheral vascular disease, unspecified I70.245 - Atherosclerosis of native arteries of left leg with ulceration of other part of foot The patient has an appointment with his podiatrist at [facility] and will be seeing Dr. Buddy Duty. They had applied for his hyperbaric oxygen therapy and this report is awaited. If his insurance has authorized  hyperbaric oxygen therapy then we will get it changed over to our institution. If he does not get this by Friday we will go ahead and apply for it fresh. I have recommended Santyl ointment to be applied and a packing strip of 1/4 inch plain Nu Gauze to be used. He will change his dressing daily. He will come back and see me next week. Procedures Wound #1 Wound #1 is an Open Surgical Wound located on the Left Metatarsal head fourth . There was a Skin/Subcutaneous Tissue Debridement [id number]) debridement with total area of 1.2 sq cm performed by Christin Fudge, MD. with the following instrument(s): Curette to remove Viable and Non-Viable tissue/material including  Exudate, Fibrin/Slough, and Subcutaneous after achieving pain control using Other (lidocaine 4% cream). A time out was conducted prior to the start of the procedure. A Minimum amount of bleeding was controlled with Pressure. The procedure was tolerated well with a pain level of 0 throughout and a pain level of 0 following the procedure. Post Debridement Measurements: 2.4cm length x 0.5cm width x 0.5cm depth; 0.471cm^3 volume. Post procedure Diagnosis Wound #1: Same as Pre-Procedure General Notes: after sharp debridement with a curette both these wounds communicate and there is a tunnel in the region where there was his fourth metatarsal. There is no purulence at the present time there is some necrotic debris which was sharply removed. Mccomb, NOAR ALESSANDRO ([id number]) Plan Wound Cleansing: Wound #1 Left Metatarsal head fourth: Clean wound with Normal Saline. Anesthetic: Wound #1 Left Metatarsal head fourth: Topical Lidocaine 4% cream applied to wound bed prior to debridement Skin Barriers/Peri-Wound Care: Wound #1 Left Metatarsal head fourth: Skin Prep Primary Wound Dressing: Wound #1 Left Metatarsal head fourth: Santyl Ointment Foam - ******only to the deep tissue injuries****** Pack wound with: - 1/4 inch pain packing Secondary Dressing: Wound #1 Left Metatarsal head fourth: Dry Gauze - tape, conform Dressing Change Frequency: Wound #1 Left Metatarsal head fourth: Change dressing every day. Follow-up Appointments: Wound #1 Left Metatarsal head fourth: Return Appointment in 1 week. Edema Control: Wound #1 Left Metatarsal head fourth: Elevate legs to the level of the heart and pump ankles as often as possible The following medication(s) was prescribed: Santyl topical 250 unit/gram ointment ointment topical as directed starting 04/04/2016 The patient has an appointment with his podiatrist at [facility] and will be seeing Dr. Buddy Duty. They had applied for his hyperbaric  oxygen therapy and this report is awaited. If his insurance has authorized hyperbaric oxygen therapy then we will get it changed over to our institution. If he does not get this by Friday we will go ahead and apply for it fresh. I have recommended Santyl ointment to be applied and a packing strip of 1/4 inch plain Nu Gauze to be used. He will change his dressing daily. He will come back and see me next week. JOLIN, MILBERT ([id number]) Electronic Signature(s) Signed: 04/04/2016 2:26:41 PM By: Christin Fudge MD, FACS Previous Signature: 04/04/2016 2:26:29 PM Version By: Christin Fudge MD, FACS Previous Signature: 04/04/2016 2:24:41 PM Version By: Christin Fudge MD, FACS Entered By: Christin Fudge on 04/04/2016 14:26:40 Durr, [name] ([id number]) -------------------------------------------------------------------------------- SuperBill Details Patient Name: Moorefield, Bryler L. Date of Service: 04/04/2016 Medical Record Number: [id number] Patient Account Number: [id number] Date of Birth/Sex: 12/20/1951 (65 y.o. Male) Treating RN: Ahmed Prima Primary Care Physician: Starlyn Skeans Other Clinician: Referring Physician: Starlyn Skeans Treating Physician/Extender: Frann Rider in Treatment: 1 Diagnosis Coding ICD-10 Codes Code Description E11.621 Type 2 diabetes  mellitus with foot ulcer L97.522 Non-pressure chronic ulcer of other part of left foot with fat layer exposed I73.9 Peripheral vascular disease, unspecified I70.245 Atherosclerosis of native arteries of left leg with ulceration of other part of foot Facility Procedures CPT4: Description Modifier Quantity Code [id number] - DEB SUBQ TISSUE 20 SQ CM/< 1 ICD-10 Description Diagnosis E11.621 Type 2 diabetes mellitus with foot ulcer L97.522 Non-pressure chronic ulcer of other part of left foot with fat layer exposed  I73.9 Peripheral vascular disease, unspecified I70.245 Atherosclerosis of native arteries of left leg with ulceration of other  part of foot Physician Procedures CPT4: Description Modifier Quantity Code [id number] - WC PHYS SUBQ TISS 20 SQ CM 1 ICD-10 Description Diagnosis E11.621 Type 2 diabetes mellitus with foot ulcer L97.522 Non-pressure chronic ulcer of other part of left foot with fat layer exposed I73.9  Peripheral vascular disease, unspecified I70.245 Atherosclerosis of native arteries of left leg with ulceration of other part of foot Electronic Signature(s) Signed: 04/04/2016 2:26:53 PM By: Christin Fudge MD, FACS Degen, [name] ([id number]) Entered By: Christin Fudge on 04/04/2016 14:26:53

## 2016-04-06 ENCOUNTER — Ambulatory Visit: Payer: Medicare Other | Attending: Internal Medicine

## 2016-04-08 DIAGNOSIS — Z89422 Acquired absence of other left toe(s): Secondary | ICD-10-CM | POA: Diagnosis not present

## 2016-04-08 DIAGNOSIS — I739 Peripheral vascular disease, unspecified: Secondary | ICD-10-CM | POA: Diagnosis not present

## 2016-04-08 DIAGNOSIS — E11621 Type 2 diabetes mellitus with foot ulcer: Secondary | ICD-10-CM | POA: Diagnosis not present

## 2016-04-08 DIAGNOSIS — L97529 Non-pressure chronic ulcer of other part of left foot with unspecified severity: Secondary | ICD-10-CM | POA: Diagnosis not present

## 2016-04-14 ENCOUNTER — Encounter: Payer: Medicare Other | Admitting: Surgery

## 2016-04-14 ENCOUNTER — Ambulatory Visit
Admission: RE | Admit: 2016-04-14 | Discharge: 2016-04-14 | Disposition: A | Discharge status: Home / Self Care | Payer: Medicare Other | Source: Ambulatory Visit | Attending: Internal Medicine | Admitting: Internal Medicine

## 2016-04-14 DIAGNOSIS — M069 Rheumatoid arthritis, unspecified: Secondary | ICD-10-CM | POA: Diagnosis not present

## 2016-04-14 DIAGNOSIS — I70245 Atherosclerosis of native arteries of left leg with ulceration of other part of foot: Secondary | ICD-10-CM | POA: Diagnosis not present

## 2016-04-14 DIAGNOSIS — L97522 Non-pressure chronic ulcer of other part of left foot with fat layer exposed: Secondary | ICD-10-CM | POA: Diagnosis not present

## 2016-04-14 DIAGNOSIS — Z01810 Encounter for preprocedural cardiovascular examination: Secondary | ICD-10-CM | POA: Diagnosis not present

## 2016-04-14 DIAGNOSIS — Z01818 Encounter for other preprocedural examination: Secondary | ICD-10-CM | POA: Insufficient documentation

## 2016-04-14 DIAGNOSIS — T8131XA Disruption of external operation (surgical) wound, not elsewhere classified, initial encounter: Secondary | ICD-10-CM | POA: Diagnosis not present

## 2016-04-14 DIAGNOSIS — I739 Peripheral vascular disease, unspecified: Secondary | ICD-10-CM | POA: Diagnosis not present

## 2016-04-14 DIAGNOSIS — E11621 Type 2 diabetes mellitus with foot ulcer: Secondary | ICD-10-CM | POA: Diagnosis not present

## 2016-04-14 DIAGNOSIS — E1122 Type 2 diabetes mellitus with diabetic chronic kidney disease: Secondary | ICD-10-CM | POA: Diagnosis not present

## 2016-04-15 DIAGNOSIS — L97529 Non-pressure chronic ulcer of other part of left foot with unspecified severity: Secondary | ICD-10-CM | POA: Diagnosis not present

## 2016-04-15 DIAGNOSIS — E1152 Type 2 diabetes mellitus with diabetic peripheral angiopathy with gangrene: Secondary | ICD-10-CM | POA: Diagnosis not present

## 2016-04-15 DIAGNOSIS — E11621 Type 2 diabetes mellitus with foot ulcer: Secondary | ICD-10-CM | POA: Diagnosis not present

## 2016-04-15 DIAGNOSIS — T8754 Necrosis of amputation stump, left lower extremity: Secondary | ICD-10-CM | POA: Diagnosis not present

## 2016-04-16 NOTE — Progress Notes (Signed)
DEVRON, HOUSDEN ([id number]) Visit Report for 04/14/2016 Chief Complaint Document Details Patient Name: [name], [name] Date of Service: 04/14/2016 1:30 PM Medical Record Number: [id number] Patient Account Number: [id number] Date of Birth/Sex: 1951-05-20 (65 y.o. Male) Treating RN: Ahmed Prima Primary Care Physician: Starlyn Skeans Other Clinician: Referring Physician: Starlyn Skeans Treating Physician/Extender: Frann Rider in Treatment: 2 Information Obtained from: Patient Chief Complaint Patients presents for treatment of an open diabetic ulcer to the left foot with a nonhealing surgical wound for about a month and a half. Electronic Signature(s) Signed: 04/14/2016 2:01:58 PM By: Christin Fudge MD, FACS Entered By: Christin Fudge on 04/14/2016 14:01:58 [name], [name] ([id number]) -------------------------------------------------------------------------------- Debridement Details Patient Name: [name], [name] Date of Service: 04/14/2016 1:30 PM Medical Record Number: [id number] Patient Account Number: [id number] Date of Birth/Sex: January 17, 1951 (65 y.o. Male) Treating RN: Carolyne Fiscal, Debi Primary Care Physician: Starlyn Skeans Other Clinician: Referring Physician: Starlyn Skeans Treating Physician/Extender: Frann Rider in Treatment: 2 Debridement Performed for Wound #1 Left Metatarsal head fourth Assessment: Performed By: Physician Christin Fudge, MD Debridement: Debridement Pre-procedure Yes Verification/Time Out Taken: Start Time: 13:52 Pain Control: Other : lidocaine 4% cream Level: Skin/Subcutaneous Tissue Total Area Debrided (L x 2.3 (cm) x 0.5 (cm) = 1.15 (cm) W): Tissue and other Viable, Non-Viable, Exudate, Fibrin/Slough, Subcutaneous material debrided: Instrument: Forceps, Scissors Bleeding: Minimum Hemostasis Achieved: Pressure End Time: 13:56 Procedural Pain: 0 Post Procedural Pain: 0 Response to Treatment: Procedure was tolerated well Post Debridement  Measurements of Total Wound Length: (cm) 2.3 Width: (cm) 0.5 Depth: (cm) 0.4 Volume: (cm) 0.361 Post Procedure Diagnosis Same as Pre-procedure Electronic Signature(s) Signed: 04/14/2016 2:05:48 PM By: Christin Fudge MD, FACS Signed: 04/15/2016 5:24:19 PM By: Alric Quan Entered By: Christin Fudge on 04/14/2016 14:05:48 [name], [name] ([id number]) -------------------------------------------------------------------------------- HPI Details Patient Name: [name], [name] Date of Service: 04/14/2016 1:30 PM Medical Record Number: [id number] Patient Account Number: [id number] Date of Birth/Sex: 11/02/1951 (65 y.o. Male) Treating RN: Ahmed Prima Primary Care Physician: Starlyn Skeans Other Clinician: Referring Physician: Starlyn Skeans Treating Physician/Extender: Frann Rider in Treatment: 2 History of Present Illness Location: wound in the region of the previous surgery where he's had a ray amputation of his left fourth toe Quality: Patient reports experiencing a dull pain to affected area(s). Severity: Patient states wound are getting worse. Duration: Patient has had the wound for > 2 months prior to seeking treatment at the wound center Timing: Pain in wound is Intermittent (comes and goes Context: The wound occurred when the patient was found to have an osteomyelitis of the left fourth toe and had surgery performed at [facility] Modifying Factors: Consults to this date include:angiography and podiatry at [place] Signs and Symptoms: Patient reports having increase discharge. HPI Description: This 65 year old gentleman was referred to as by Dr. Buddy Duty from [facility], and has been treated there for a diabetic ulcer of the left foot associated with peripheral arterial disease and osteomyelitis due to a history of Wagner grade 3 infection of the left foot. Patient was on oral doxycycline and debridement was done at the location of the left toe surgical  end dictation side. He has been awaiting authorization of hyperbaric oxygen therapy and Regranex. Since he lives closer to our wound center he was referred to [place] for the possibility of hyperbaric oxygen therapy. [name] problem list includes diabetes mellitus type 2, essential hypertension, hammertoe, rheumatoid arthritis, cellulitis and abscess of the toe, current data disease, chronic kidney disease stage III,  systolic heart failure, pancytopenia. 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 Status post left fourth toe ray amputation without evidence of osteomyelitis. The patient also had an arteriogram of the left lower extremity done on 02/09/2016 for previous arterial duplex showing no flow in the PTA and dampened flow in the ATA. The right great toe pressure was 40 mmHg and the left great toe pressure was 38 mmHg. Final impression was runoff disease with single- vessel runoff to the ankle not available to endovascular revascularization. MRI of the foot done on 11/30/2016 showed left fourth toe findings consistent with osteomyelitis involving the distal aspect of the fourth proximal phalanx with osteitis of the fused fourth middle and distal phalanx. There was also diffuse soft tissue swelling of the forefoot without discrete abscess. 04/14/2016 --since I saw him 10 days ago things have changed a little bit where he has dry gangrene on the second toe and on the third toe. He is also been to [facility] and they have got him Regranex, which he is applying once a day. We tried to get in touch with the patient to clarify that [facility] had not got any  authorization for hyperbaric oxygen therapy but the patient was unavailable. We will discuss his insurance coverage today. chest x-ray done on 04/04/2016 was within normal limits Electronic Signature(s) KEYNAN, MATHENY ([id number]) Signed: 04/14/2016 2:15:50 PM By: Christin Fudge MD, FACS Previous Signature: 04/14/2016 2:04:27 PM Version By: Christin Fudge MD, FACS Entered By: Christin Fudge on 04/14/2016 14:15:50 [name], [name] ([id number]) -------------------------------------------------------------------------------- Physical Exam Details Patient Name: Sinnett, Perez L. Date of Service: 04/14/2016 1:30 PM Medical Record Number: [id number] Patient Account Number: [id number] Date of Birth/Sex: 10/02/1951 (65 y.o. Male) Treating RN: Ahmed Prima Primary Care Physician: Starlyn Skeans Other Clinician: Referring Physician: Starlyn Skeans Treating Physician/Extender: Frann Rider in Treatment: 2 Constitutional . Pulse regular. Respirations normal and unlabored. Afebrile. . Eyes Nonicteric. Reactive to light. Ears, Nose, Mouth, and Throat Lips, teeth, and gums WNL.[name] Moist mucosa without lesions. Neck supple and nontender. No palpable supraclavicular or cervical adenopathy. Normal sized without goiter. Respiratory WNL. No retractions.. Breath sounds WNL, No rubs, rales, rhonchi, or wheeze.. Cardiovascular Heart rhythm and rate regular, no murmur or gallop.. Pedal Pulses WNL. No clubbing, cyanosis or edema. Lymphatic No adneopathy. No adenopathy. No adenopathy. Musculoskeletal Adexa without tenderness or enlargement.. Digits and nails w/o clubbing, cyanosis, infection, petechiae, ischemia, or inflammatory conditions.. Integumentary (Hair, Skin) No suspicious lesions. No crepitus or fluctuance. No peri-wound warmth or erythema. No masses.[name] Psychiatric Judgement and insight Intact.. No evidence of depression, anxiety, or agitation.. Notes there are dry gangrene patches on the tip of his  second toe and the medial part of his third toe and this is new. The wound at the base of the fourth metatarsal has 2 openings in both of these have significant necrotic debris which I sharply removed with a forcep and scissors. Electronic Signature(s) Signed: 04/14/2016 2:05:33 PM By: Christin Fudge MD, FACS Entered By: Christin Fudge on 04/14/2016 14:05:32 Yandell, [name] ([id number]) -------------------------------------------------------------------------------- Physician Orders Details Patient Name: [name], [name] Date of Service: 04/14/2016 1:30 PM Medical Record Number: [id number] Patient Account Number: [id number] Date of Birth/Sex: 1951-11-06 (65 y.o. Male) Treating  RN: Ahmed Prima Primary Care Physician: Starlyn Skeans Other Clinician: Referring Physician: Starlyn Skeans Treating Physician/Extender: Frann Rider in Treatment: 2 Verbal / Phone Orders: Yes Clinician: Pinkerton, Debi Read Back and Verified: Yes Diagnosis Coding Wound Cleansing Wound #1 Left Metatarsal head fourth o Clean wound with Normal Saline. Anesthetic Wound #1 Left Metatarsal head fourth o Topical Lidocaine 4% cream applied to wound bed prior to debridement Skin Barriers/Peri-Wound Care Wound #1 Left Metatarsal head fourth o Skin Prep Primary Wound Dressing Wound #1 Left Metatarsal head fourth o Hydrogel o Foam - ******only to the deep tissue injuries****** Secondary Dressing Wound #1 Left Metatarsal head fourth o Dry Gauze - tape, conform Dressing Change Frequency Wound #1 Left Metatarsal head fourth o Change dressing every day. Follow-up Appointments Wound #1 Left Metatarsal head fourth o Return Appointment in 1 week. Edema Control Wound #1 Left Metatarsal head fourth o Elevate legs to the level of the heart and pump ankles as often as possible [name], [name] ([id number]) Electronic Signature(s) Signed: 04/14/2016 4:14:45 PM By: Christin Fudge MD, FACS Signed:  04/15/2016 5:24:19 PM By: Alric Quan Entered By: Alric Quan on 04/14/2016 13:58:26 [name], [name] ([id number]) -------------------------------------------------------------------------------- Problem List Details Patient Name: [name], [name] Date of Service: 04/14/2016 1:30 PM Medical Record Number: [id number] Patient Account Number: [id number] Date of Birth/Sex: 09/11/1951 (65 y.o. Male) Treating RN: Ahmed Prima Primary Care Physician: Starlyn Skeans Other Clinician: Referring Physician: Starlyn Skeans Treating Physician/Extender: Frann Rider in Treatment: 2 Active Problems ICD-10 Encounter Code Description Active Date Diagnosis E11.621 Type 2 diabetes mellitus with foot ulcer 03/28/2016 Yes L97.522 Non-pressure chronic ulcer of other part of left foot with fat 03/28/2016 Yes layer exposed I73.9 Peripheral vascular disease, unspecified 03/28/2016 Yes I70.245 Atherosclerosis of native arteries of left leg with ulceration 03/28/2016 Yes of other part of foot Inactive Problems Resolved Problems Electronic Signature(s) Signed: 04/14/2016 2:01:48 PM By: Christin Fudge MD, FACS Entered By: Christin Fudge on 04/14/2016 14:01:48 [name], [name] ([id number]) -------------------------------------------------------------------------------- Progress Note Details Patient Name: [name], [name] Date of Service: 04/14/2016 1:30 PM Medical Record Number: [id number] Patient Account Number: [id number] Date of Birth/Sex: 03/24/1951 (65 y.o. Male) Treating RN: Ahmed Prima Primary Care Physician: Starlyn Skeans Other Clinician: Referring Physician: Starlyn Skeans Treating Physician/Extender: Frann Rider in Treatment: 2 Subjective Chief Complaint Information obtained from Patient Patients presents for treatment of an open diabetic ulcer to the left foot with a nonhealing surgical wound for about a month and a half. History of Present Illness (HPI) The following HPI elements were  documented for the patient's wound: Location: wound in the region of the previous surgery where he's had a ray amputation of his left fourth toe Quality: Patient reports experiencing a dull pain to affected area(s). Severity: Patient states wound are getting worse. Duration: Patient has had the wound for > 2 months prior to seeking treatment at the wound center Timing: Pain in wound is Intermittent (comes and goes Context: The wound occurred when the patient was found to have an osteomyelitis of the left fourth toe and had surgery performed at [facility] Modifying Factors: Consults to this date include:angiography and podiatry at [place] Signs and Symptoms: Patient reports having increase discharge. This 65 year old gentleman was referred to as by Dr. Buddy Duty from [facility], and has been treated there for a diabetic ulcer of the left foot associated with peripheral arterial disease and osteomyelitis due to a history of Wagner grade 3 infection of the left foot. Patient  was on oral doxycycline and debridement was done at the location of the left toe surgical end dictation side. He has been awaiting authorization of hyperbaric oxygen therapy and Regranex. Since he lives closer to our wound center he was referred to [place] for the possibility of hyperbaric oxygen therapy. Patient s problem list includes diabetes mellitus type 2, essential hypertension, hammertoe, rheumatoid arthritis, cellulitis and abscess of the toe, current data disease, chronic kidney disease stage III, systolic heart failure, pancytopenia. Past surgical history significant for amputation of the right toe in 2014, right second toe amputation, coronary angioplasty, current artery bypass graft, recent left fourth toe ray amputation X-ray of the left foot done on 02/27/2016 shows no radiographic evidence for osteomyelitis no dissecting soft tissue gas and stable erosive changes suggestive of inflammatory  arthropathy. Status post left fourth toe ray amputation without evidence of osteomyelitis. The patient also had an arteriogram of the left lower extremity done on 02/09/2016 for previous arterial duplex showing no flow in the PTA and dampened flow in the ATA. The right great toe pressure was 40 mmHg and the left great toe pressure was 38 mmHg. Final impression was runoff disease with single- vessel runoff to the ankle not available to endovascular revascularization. MRI of the foot done on 11/30/2016 showed left fourth toe findings consistent with osteomyelitis involving the distal aspect of the fourth proximal phalanx with osteitis of the fused fourth middle and distal phalanx. There was also diffuse soft tissue swelling of the forefoot without discrete abscess. [name], [name] ([id number]) 04/14/2016 --since I saw him 10 days ago things have changed a little bit where he has dry gangrene on the second toe and on the third toe. He is also been to [facility] and they have got him Regranex, which he is applying once a day. We tried to get in touch with the patient to clarify that [facility] had not got any authorization for hyperbaric oxygen therapy but the patient was unavailable. We will discuss his insurance coverage today. chest x-ray done on 04/04/2016 was within normal limits Objective Constitutional Pulse regular. Respirations normal and unlabored. Afebrile. Vitals Time Taken: 1:26 PM, Height: 69 in, Temperature: 97.9 F, Pulse: 81 bpm, Respiratory Rate: 20 breaths/min, Blood Pressure: 118/67 mmHg. Eyes Nonicteric. Reactive to light. Ears, Nose, Mouth, and Throat Lips, teeth, and gums WNL.[name] Moist mucosa without lesions. Neck supple and nontender. No palpable supraclavicular or cervical adenopathy. Normal sized without goiter. Respiratory WNL. No retractions.. Breath sounds WNL, No rubs, rales, rhonchi, or wheeze.. Cardiovascular Heart rhythm and rate regular, no murmur or gallop..  Pedal Pulses WNL. No clubbing, cyanosis or edema. Lymphatic No adneopathy. No adenopathy. No adenopathy. Musculoskeletal Adexa without tenderness or enlargement.. Digits and nails w/o clubbing, cyanosis, infection, petechiae, ischemia, or inflammatory conditions.[name] Psychiatric Judgement and insight Intact.. No evidence of depression, anxiety, or agitation.. General Notes: there are dry gangrene patches on the tip of his second toe and the medial part of his third toe and this is new. The wound at the base of the fourth metatarsal has 2 openings in both of these have significant necrotic debris which I sharply removed with a forcep and scissors. [place], [place] ([id number]) Integumentary (Hair, Skin) No suspicious lesions. No crepitus or fluctuance. No peri-wound warmth or erythema. No masses.. Wound #1 status is Open. Original cause of wound was Surgical Injury. The wound is located on the Left Metatarsal head fourth. The wound measures 2.3cm length x 0.5cm width x 0.3cm depth; 0.903cm^2 area  and 0.271cm^3 volume. The wound is limited to skin breakdown. There is no tunneling noted, however, there is undermining starting at 9:00 and ending at :00 with a maximum distance of 0.3cm. There is a large amount of serous drainage noted. The wound margin is flat and intact. There is no granulation within the wound bed. There is a large (67-100%) amount of necrotic tissue within the wound bed including Adherent Slough. The periwound skin appearance exhibited: Localized Edema, Moist. Periwound temperature was noted as No Abnormality. The periwound has tenderness on palpation. Assessment Active Problems ICD-10 E11.621 - Type 2 diabetes mellitus with foot ulcer L97.522 - Non-pressure chronic ulcer of other part of left foot with fat layer exposed I73.9 - Peripheral vascular disease, unspecified I70.245 - Atherosclerosis of native arteries of left leg with ulceration of other part of foot At the present  time the patient has been using Regranex as per instructions from [facility] and I have asked him to continue with this. We will work on his authorization for hyperbaric oxygen therapy and at the present time he seems to have a diabetic foot ulcer which could be graded as a Wagner for because of the dry gangrene he has at the third and second toe. He will work with my HBO tech today for orientation and insurance authorization. Procedures Wound #1 Wound #1 is an Open Surgical Wound located on the Left Metatarsal head fourth . There was a Skin/Subcutaneous Tissue Debridement [id number]) debridement with total area of 1.15 sq cm performed by Christin Fudge, MD. with the following instrument(s): Forceps and Scissors to remove Viable and Non-Viable tissue/material including Exudate, Fibrin/Slough, and Subcutaneous after achieving pain control using Other (lidocaine 4% cream). A time out was conducted prior to the start of the procedure. A Minimum amount of bleeding was controlled with Pressure. The procedure was tolerated well with a pain level of 0 Sickman, Alessio L. ([id number]) throughout and a pain level of 0 following the procedure. Post Debridement Measurements: 2.3cm length x 0.5cm width x 0.4cm depth; 0.361cm^3 volume. Post procedure Diagnosis Wound #1: Same as Pre-Procedure Plan Wound Cleansing: Wound #1 Left Metatarsal head fourth: Clean wound with Normal Saline. Anesthetic: Wound #1 Left Metatarsal head fourth: Topical Lidocaine 4% cream applied to wound bed prior to debridement Skin Barriers/Peri-Wound Care: Wound #1 Left Metatarsal head fourth: Skin Prep Primary Wound Dressing: Wound #1 Left Metatarsal head fourth: Hydrogel Foam - ******only to the deep tissue injuries****** Secondary Dressing: Wound #1 Left Metatarsal head fourth: Dry Gauze - tape, conform Dressing Change Frequency: Wound #1 Left Metatarsal head fourth: Change dressing every day. Follow-up  Appointments: Wound #1 Left Metatarsal head fourth: Return Appointment in 1 week. Edema Control: Wound #1 Left Metatarsal head fourth: Elevate legs to the level of the heart and pump ankles as often as possible At the present time the patient has been using Regranex as per instructions from [facility] and I have asked him to continue with this. We will work on his authorization for hyperbaric oxygen therapy and at the present time he seems to have a diabetic foot ulcer which could be graded as a Wagner for because of the dry gangrene he has at the third and second toe. [place], JALEAN HORSTMAN ([id number]) He will work with my HBO tech today for orientation and insurance authorization. Electronic Signature(s) Signed: 04/14/2016 2:16:09 PM By: Christin Fudge MD, FACS Previous Signature: 04/14/2016 2:11:26 PM Version By: Christin Fudge MD, FACS Entered By: Christin Fudge on 04/14/2016 14:16:08 [place],  [place]. ([id number]) -------------------------------------------------------------------------------- SuperBill Details Patient Name: Desanto, Bianca L. Date of Service: 04/14/2016 Medical Record Number: [id number] Patient Account Number: [id number] Date of Birth/Sex: April 19, 1951 (65 y.o. Male) Treating RN: Carolyne Fiscal, Debi Primary Care Physician: Starlyn Skeans Other Clinician: Referring Physician: Starlyn Skeans Treating Physician/Extender: Frann Rider in Treatment: 2 Diagnosis Coding ICD-10 Codes Code Description E11.621 Type 2 diabetes mellitus with foot ulcer L97.522 Non-pressure chronic ulcer of other part of left foot with fat layer exposed I73.9 Peripheral vascular disease, unspecified I70.245 Atherosclerosis of native arteries of left leg with ulceration of other part of foot Facility Procedures CPT4: Description Modifier Quantity Code [id number] - DEB SUBQ TISSUE 20 SQ CM/< 1 ICD-10 Description Diagnosis E11.621 Type 2 diabetes mellitus with foot ulcer L97.522 Non-pressure chronic  ulcer of other part of left foot with fat layer exposed  I73.9 Peripheral vascular disease, unspecified I70.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

## 2016-04-16 NOTE — Progress Notes (Signed)
TIVON, MOXEY ([id number]) Visit Report for 04/14/2016 Arrival Information Details Patient Name: [name], [name]. Date of Service: 04/14/2016 1:30 PM Medical Record Number: [id number] Patient Account Number: [id number] Date of Birth/Sex: May 26, 1951 (65 y.o. Male) Treating RN: Ahmed Prima Primary Care Physician: Starlyn Skeans Other Clinician: Referring Physician: Starlyn Skeans Treating Physician/Extender: Frann Rider in Treatment: 2 Visit Information History Since Last Visit All ordered tests and consults were completed: No Patient Arrived: Wheel Chair Added or deleted any medications: No Arrival Time: 13:24 Any new allergies or adverse reactions: No Accompanied By: wife Had a fall or experienced change in No Transfer Assistance: [employer] activities of daily living that may affect Patient Lift risk of falls: Patient Identification Verified: Yes Signs or symptoms of abuse/neglect since last No Secondary Verification Process Yes visito Completed: Hospitalized since last visit: No Patient Requires Transmission- No Pain Present Now: No Based Precautions: Patient Has Alerts: Yes Patient Alerts: DM II Electronic Signature(s) Signed: 04/15/2016 5:24:19 PM By: Alric Quan Entered By: Alric Quan on 04/14/2016 13:26:18 [name], [name] ([id number]) -------------------------------------------------------------------------------- Encounter Discharge Information Details Patient Name: [name], [name] Date of Service: 04/14/2016 1:30 PM Medical Record Number: [id number] Patient Account Number: [id number] Date of Birth/Sex: [birth year] (65 y.o. Male) Treating RN: Ahmed Prima Primary Care Physician: Starlyn Skeans Other Clinician: Referring Physician: Starlyn Skeans Treating Physician/Extender: Frann Rider in Treatment: 2 Encounter Discharge Information Items Discharge Pain Level: 0 Discharge Condition: Stable Ambulatory Status: Wheelchair Discharge  Destination: Home Transportation: Private Auto Accompanied By: wife Schedule Follow-up Appointment: Yes Medication Reconciliation completed and provided to Patient/Care Yes Gerri A[place]: Provided on Clinical Summary of Care: 04/14/2016 Form Type Recipient Paper Patient JP Electronic Signature(s) Signed: 04/14/2016 2:15:13 PM By: Ruthine Dose Entered By: Ruthine Dose on 04/14/2016 14:15:12 [name], [name] ([id number]) -------------------------------------------------------------------------------- Lower Extremity Assessment Details Patient Name: Kuri, Clavin L. Date of Service: 04/14/2016 1:30 PM Medical Record Number: [id number] Patient Account Number: [id number] Date of Birth/Sex: 06/17/1951 (65 y.o. Male) Treating RN: Ahmed Prima Primary Care Physician: Starlyn Skeans Other Clinician: Referring Physician: Starlyn Skeans Treating Physician/Extender: Frann Rider in Treatment: 2 Vascular Assessment Pulses: Posterior Tibial Dorsalis Pedis Palpable: [Left:Yes] Extremity colors, hair growth, and conditions: Extremity Color: [Left:Normal] Temperature of Extremity: [Left:Warm] Capillary Refill: [Left:< 3 seconds] Toe Nail Assessment Left: Right: Thick: Yes Discolored: Yes Deformed: No Improper Length and Hygiene: Yes Electronic Signature(s) Signed: 04/15/2016 5:24:19 PM By: Alric Quan Entered By: Alric Quan on 04/14/2016 13:29:42 [name], [name] ([id number]) -------------------------------------------------------------------------------- Multi Wound Chart Details Patient Name: Vandam, Alon L. Date of Service: 04/14/2016 1:30 PM Medical Record Number: [id number] Patient Account Number: [id number] Date of Birth/Sex: September 25, 1951 (65 y.o. Male) Treating RN: Carolyne Fiscal, Debi Primary Care Physician: Starlyn Skeans Other Clinician: Referring Physician: Starlyn Skeans Treating Physician/Extender: Frann Rider in Treatment: 2 Vital Signs Height(in):  69 Pulse(bpm): 81 Weight(lbs): Blood Pressure 118/67 (mmHg): Body Mass Index(BMI): Temperature(F): 97.9 Respiratory Rate 20 (breaths/min): Photos: [1:No Photos] [N/A:N/A] Wound Location: [1:Left Metatarsal head fourth] [N/A:N/A] Wounding Event: [1:Surgical Injury] [N/A:N/A] Primary Etiology: [1:Open Surgical Wound] [N/A:N/A] Comorbid History: [1:Cataracts, Arrhythmia, Congestive Heart Failure, Hypertension, Type II Diabetes, Rheumatoid Arthritis, Neuropathy] [N/A:N/A] Date Acquired: [1:02/26/2016] [N/A:N/A] Weeks of Treatment: [1:2] [N/A:N/A] Wound Status: [1:Open] [N/A:N/A] Measurements L x W x D 2.3x0.5x0.3 [N/A:N/A] (cm) Area (cm) : [1:0.903] [N/A:N/A] Volume (cm) : [1:0.271] [N/A:N/A] % Reduction in Area: [1:-36.80%] [N/A:N/A] % Reduction in Volume: -310.60% [N/A:N/A] Starting Position 1 9 (o'clock): Ending Position 1 (o'clock): Maximum Distance 1 0.3 (cm): Undermining: [1:Yes] [N/A:N/A] Classification: [1:Partial Thickness] [N/A:N/A] HBO  Classification: [1:Grade 1] [N/A:N/A] Exudate Amount: [1:Large] [N/A:N/A] Exudate Type: [1:Serous] [N/A:N/A] Exudate Color: amber N/A N/A Wound Margin: Flat and Intact N/A N/A Granulation Amount: None Present (0%) N/A N/A Necrotic Amount: Large (67-100%) N/A N/A Exposed Structures: Fascia: No N/A N/A Fat: No Tendon: No Muscle: No Joint: No Bone: No Limited to Skin Breakdown Epithelialization: None N/A N/A Periwound Skin Texture: Edema: Yes N/A N/A Periwound Skin Moist: Yes N/A N/A Moisture: Periwound Skin Color: No Abnormalities Noted N/A N/A Temperature: No Abnormality N/A N/A Tenderness on Yes N/A N/A Palpation: Wound Preparation: Ulcer Cleansing: N/A N/A Rinsed/Irrigated with Saline Topical Anesthetic Applied: Other: lidocaine 4% Treatment Notes Electronic Signature(s) Signed: 04/15/2016 5:24:19 PM By: Alric Quan Entered By: Alric Quan on 04/14/2016 13:36:17 [name], [name]  ([id number]) -------------------------------------------------------------------------------- [place] Details Patient Name: [name], [name] Date of Service: 04/14/2016 1:30 PM Medical Record Number: [id number] Patient Account Number: [id number] Date of Birth/Sex: 12-Nov-1951 (65 y.o. Male) Treating RN: Ahmed Prima Primary Care Physician: Starlyn Skeans Other Clinician: Referring Physician: Starlyn Skeans Treating Physician/Extender: Frann Rider in Treatment: 2 Active Inactive Abuse / Safety / Falls / Self Care Management Nursing Diagnoses: Potential for falls Goals: Patient will remain injury free Date Initiated: 03/28/2016 Goal Status: Active Interventions: Assess fall risk on admission and as needed Notes: Orientation to the Wound Care Program Nursing Diagnoses: Knowledge deficit related to the wound healing center program Goals: Patient/caregiver will verbalize understanding of the [place] [facility] Date Initiated: 03/28/2016 Goal Status: Active Interventions: Provide education on orientation to the wound center Notes: Pain, Acute or Chronic Nursing Diagnoses: Pain, acute or chronic: actual or potential Potential alteration in comfort, pain Goals: [name], [name] ([id number]) Patient will verbalize adequate pain control and receive pain control interventions during procedures as needed Date Initiated: 03/28/2016 Goal Status: Active Interventions: Assess comfort goal upon admission Complete pain assessment as per visit requirements Notes: Wound/Skin Impairment Nursing Diagnoses: Impaired tissue integrity Goals: Ulcer/skin breakdown will have a volume reduction of 30% by week 4 Date Initiated: 03/28/2016 Goal Status: Active Ulcer/skin breakdown will have a volume reduction of 50% by week 8 Date Initiated: 03/28/2016 Goal Status: Active Ulcer/skin breakdown will have a volume reduction of 80% by week 12 Date Initiated: 03/28/2016 Goal  Status: Active Interventions: Assess patient/caregiver ability to obtain necessary supplies Assess ulceration(s) every visit Notes: Electronic Signature(s) Signed: 04/15/2016 5:24:19 PM By: Alric Quan Entered By: Alric Quan on 04/14/2016 13:35:51 [name], [name] ([id number]) -------------------------------------------------------------------------------- Pain Assessment Details Patient Name: [name], [name] Date of Service: 04/14/2016 1:30 PM Medical Record Number: [id number] Patient Account Number: [id number] Date of Birth/Sex: [birth year] (65 y.o. Male) Treating RN: Ahmed Prima Primary Care Physician: Starlyn Skeans Other Clinician: Referring Physician: Starlyn Skeans Treating Physician/Extender: Frann Rider in Treatment: 2 Active Problems Location of Pain Severity and Description of Pain Patient Has Paino No Site Locations Pain Management and Medication Current Pain Management: Electronic Signature(s) Signed: 04/15/2016 5:24:19 PM By: Alric Quan Entered By: Alric Quan on 04/14/2016 13:26:24 [name], [name] ([id number]) -------------------------------------------------------------------------------- Patient/Caregiver Education Details Patient Name: Jagielski, Jobie L. Date of Service: 04/14/2016 1:30 PM Medical Record Number: [id number] Patient Account Number: [id number] Date of Birth/Gender: [birth year] (65 y.o. Male) Treating RN: Ahmed Prima Primary Care Physician: Starlyn Skeans Other Clinician: Referring Physician: Starlyn Skeans Treating Physician/Extender: Frann Rider in Treatment: 2 Education Assessment Education Provided To: Patient Education Topics Provided Wound/Skin Impairment: Handouts: Other: change dressing as ordered Methods: Demonstration, Explain/Verbal Responses: State content correctly Electronic Signature(s) Signed: 04/15/2016 5:24:19  PM By: Alric Quan Entered By: Alric Quan on 04/14/2016 13:59:50 [name],  [name] ([id number]) -------------------------------------------------------------------------------- Wound Assessment Details Patient Name: Marchena, Tor L. Date of Service: 04/14/2016 1:30 PM Medical Record Number: [id number] Patient Account Number: [id number] Date of Birth/Sex: Mar 21, 1951 (65 y.o. Male) Treating RN: Carolyne Fiscal, Debi Primary Care Physician: Starlyn Skeans Other Clinician: Referring Physician: Starlyn Skeans Treating Physician/Extender: Frann Rider in Treatment: 2 Wound Status Wound Number: 1 Primary Open Surgical Wound Etiology: Wound Location: Left Metatarsal head fourth Wound Open Wounding Event: Surgical Injury Status: Date Acquired: 02/26/2016 Comorbid Cataracts, Arrhythmia, Congestive Weeks Of Treatment: 2 History: Heart Failure, Hypertension, Type II Clustered Wound: No Diabetes, Rheumatoid Arthritis, Neuropathy Photos Photo Uploaded By: Alric Quan on 04/14/2016 15:19:08 Wound Measurements Length: (cm) 2.3 Width: (cm) 0.5 Depth: (cm) 0.3 Area: (cm) 0.903 Volume: (cm) 0.271 % Reduction in Area: -36.8% % Reduction in Volume: -310.6% Epithelialization: None Tunneling: No Undermining: Yes Starting Position (o'clock): 9 Maximum Distance: (cm) 0.3 Wound Description Classification: Partial Thickness Foul Odor Afte Diabetic Severity Earleen [place]): Grade 1 Wound Margin: Flat and Intact Exudate Amount: Large Exudate Type: Serous Exudate Color: amber Estey, Kane L. ([id number]) r Cleansing: No Wound Bed Granulation Amount: None Present (0%) Exposed Structure Necrotic Amount: Large (67-100%) Fascia Exposed: No Necrotic Quality: Adherent Slough Fat Layer Exposed: No Tendon Exposed: No Muscle Exposed: No Joint Exposed: No Bone Exposed: No Limited to Skin Breakdown Periwound Skin Texture Texture Color No Abnormalities Noted: No No Abnormalities Noted: No Localized Edema: Yes Temperature / Pain Moisture Temperature: No Abnormality No  Abnormalities Noted: No Tenderness on Palpation: Yes Moist: Yes Wound Preparation Ulcer Cleansing: Rinsed/Irrigated with Saline Topical Anesthetic Applied: Other: lidocaine 4%, Treatment Notes Wound #1 (Left Metatarsal head fourth) 1. Cleansed with: Clean wound with Normal Saline 2. Anesthetic Topical Lidocaine 4% cream to wound bed prior to debridement 3. Peri-wound Care: Skin Prep 4. Dressing Applied: Hydrogel 5. Secondary Dressing Applied Dry Gauze Foam 7. Secured with Tape Notes conform, foam to the deep tissues injuries Electronic Signature(s) Signed: 04/15/2016 5:24:19 PM By: Alric Quan Entered By: Alric Quan on 04/14/2016 13:35:06 Stricker, [name] ([id number]) -------------------------------------------------------------------------------- Vitals Details Patient Name: Finks, Eva L. Date of Service: 04/14/2016 1:30 PM Medical Record Number: [id number] Patient Account Number: [id number] Date of Birth/Sex: 1951-02-02 (65 y.o. Male) Treating RN: Carolyne Fiscal, Debi Primary Care Physician: Starlyn Skeans Other Clinician: Referring Physician: Starlyn Skeans Treating Physician/Extender: Frann Rider in Treatment: 2 Vital Signs Time Taken: 13:26 Temperature (F): 97.9 Height (in): 69 Pulse (bpm): 81 Respiratory Rate (breaths/min): 20 Blood Pressure (mmHg): 118/67 Reference Range: 80 - 120 mg / dl Electronic Signature(s) Signed: 04/15/2016 5:24:19 PM By: Alric Quan Entered By: Alric Quan on 04/14/2016 13:29:20

## 2016-04-21 ENCOUNTER — Encounter: Payer: Medicare Other | Admitting: Surgery

## 2016-04-21 DIAGNOSIS — E1122 Type 2 diabetes mellitus with diabetic chronic kidney disease: Secondary | ICD-10-CM | POA: Diagnosis not present

## 2016-04-21 DIAGNOSIS — M069 Rheumatoid arthritis, unspecified: Secondary | ICD-10-CM | POA: Diagnosis not present

## 2016-04-21 DIAGNOSIS — I739 Peripheral vascular disease, unspecified: Secondary | ICD-10-CM | POA: Diagnosis not present

## 2016-04-21 DIAGNOSIS — E11621 Type 2 diabetes mellitus with foot ulcer: Secondary | ICD-10-CM | POA: Diagnosis not present

## 2016-04-21 DIAGNOSIS — I70245 Atherosclerosis of native arteries of left leg with ulceration of other part of foot: Secondary | ICD-10-CM | POA: Diagnosis not present

## 2016-04-21 DIAGNOSIS — L97522 Non-pressure chronic ulcer of other part of left foot with fat layer exposed: Secondary | ICD-10-CM | POA: Diagnosis not present

## 2016-04-21 DIAGNOSIS — T8131XA Disruption of external operation (surgical) wound, not elsewhere classified, initial encounter: Secondary | ICD-10-CM | POA: Diagnosis not present

## 2016-04-22 DIAGNOSIS — E11621 Type 2 diabetes mellitus with foot ulcer: Secondary | ICD-10-CM | POA: Diagnosis not present

## 2016-04-22 DIAGNOSIS — L97529 Non-pressure chronic ulcer of other part of left foot with unspecified severity: Secondary | ICD-10-CM | POA: Diagnosis not present

## 2016-04-22 DIAGNOSIS — I739 Peripheral vascular disease, unspecified: Secondary | ICD-10-CM | POA: Diagnosis not present

## 2016-04-22 NOTE — Progress Notes (Signed)
[name] ([id number]) Visit Report for 04/21/2016 Arrival Information Details Patient Name: [name], [name]. Date of Service: 04/21/2016 3:45 PM Medical Record Number: [id number] Patient Account Number: [id number] Date of Birth/Sex: October 14, 1951 (65 y.o. Male) Treating RN: Ahmed Prima Primary Care Physician: Starlyn Skeans Other Clinician: Referring Physician: Starlyn Skeans Treating Physician/Extender: Frann Rider in Treatment: 3 Visit Information History Since Last Visit All ordered tests and consults were completed: No Patient Arrived: Wheel Chair Added or deleted any medications: No Arrival Time: 15:46 Any new allergies or adverse reactions: No Accompanied By: wife Had a fall or experienced change in No activities of daily living that may affect Transfer Assistance: None risk of falls: Patient Identification Verified: Yes Signs or symptoms of abuse/neglect since last No Secondary Verification Process Yes visito Completed: Hospitalized since last visit: No Patient Requires Transmission-Based No Pain Present Now: No Precautions: Patient Has Alerts: Yes Patient Alerts: DM II Electronic Signature(s) Signed: 04/21/2016 6:42:13 PM By: Alric Quan Entered By: Alric Quan on 04/21/2016 15:47:52 Plaut, [name] ([id number]) -------------------------------------------------------------------------------- Encounter Discharge Information Details Patient Name: Falzon, Faron L. Date of Service: 04/21/2016 3:45 PM Medical Record Number: [id number] Patient Account Number: [id number] Date of Birth/Sex: 1950/12/29 (65 y.o. Male) Treating RN: Ahmed Prima Primary Care Physician: Starlyn Skeans Other Clinician: Referring Physician: Starlyn Skeans Treating Physician/Extender: Frann Rider in Treatment: 3 Encounter Discharge Information Items Discharge Pain Level: 0 Discharge Condition: Stable Ambulatory Status: Wheelchair Discharge Destination:  Home Transportation: Private Auto Accompanied By: wife Schedule Follow-up Appointment: Yes Medication Reconciliation completed and provided to Patient/Care Yes Valeda Corzine: Provided on Clinical Summary of Care: 04/21/2016 Form Type Recipient Paper Patient JP Electronic Signature(s) Signed: 04/21/2016 6:42:13 PM By: Alric Quan Previous Signature: 04/21/2016 4:28:48 PM Version By: Ruthine Dose Entered By: Alric Quan on 04/21/2016 16:42:22 Porcaro, [name] ([id number]) -------------------------------------------------------------------------------- Lower Extremity Assessment Details Patient Name: Esson, Shadow L. Date of Service: 04/21/2016 3:45 PM Medical Record Number: [id number] Patient Account Number: [id number] Date of Birth/Sex: Mar 22, 1951 (65 y.o. Male) Treating RN: Ahmed Prima Primary Care Physician: Starlyn Skeans Other Clinician: Referring Physician: Starlyn Skeans Treating Physician/Extender: Frann Rider in Treatment: 3 Vascular Assessment Pulses: Posterior Tibial Dorsalis Pedis Palpable: [Left:Yes] Extremity colors, hair growth, and conditions: Extremity Color: [Left:Normal] Temperature of Extremity: [Left:Warm] Capillary Refill: [Left:< 3 seconds] Toe Nail Assessment Left: Right: Thick: No Discolored: No Deformed: Yes Improper Length and Hygiene: No Electronic Signature(s) Signed: 04/21/2016 6:42:13 PM By: Alric Quan Entered By: Alric Quan on 04/21/2016 15:52:51 Emmitt, [name] ([id number]) -------------------------------------------------------------------------------- Multi Wound Chart Details Patient Name: Dade, Jed L. Date of Service: 04/21/2016 3:45 PM Medical Record Number: [id number] Patient Account Number: [id number] Date of Birth/Sex: [birth year] (65 y.o. Male) Treating RN: Carolyne Fiscal, Debi Primary Care Physician: Starlyn Skeans Other Clinician: Referring Physician: Starlyn Skeans Treating Physician/Extender: Frann Rider in Treatment: 3 Vital Signs Height(in): 69 Pulse(bpm): 73 Weight(lbs): Blood Pressure 97/49 (mmHg): Body Mass Index(BMI): Temperature(F): 98.0 Respiratory Rate 20 (breaths/min): Photos: [1:No Photos] [N/A:N/A] Wound Location: [1:Left Metatarsal head fourth] [N/A:N/A] Wounding Event: [1:Surgical Injury] [N/A:N/A] Primary Etiology: [1:Open Surgical Wound] [N/A:N/A] Comorbid History: [1:Cataracts, Arrhythmia, Congestive Heart Failure, Hypertension, Type II Diabetes, Rheumatoid Arthritis, Neuropathy] [N/A:N/A] Date Acquired: [1:02/26/2016] [N/A:N/A] Weeks of Treatment: [1:3] [N/A:N/A] Wound Status: [1:Open] [N/A:N/A] Measurements L x W x D 2.3x0.4x0.4 [N/A:N/A] (cm) Area (cm) : [1:0.723] [N/A:N/A] Volume (cm) : [1:0.289] [N/A:N/A] % Reduction in Area: [1:-9.50%] [N/A:N/A] % Reduction in Volume: -337.90% [N/A:N/A] Classification: [1:Partial Thickness] [N/A:N/A] HBO Classification: [1:Grade 1] [N/A:N/A] Exudate Amount: [1:Large] [N/A:N/A] Exudate Type: [1:Serous] [  N/A:N/A] Exudate Color: [1:amber] [N/A:N/A] Wound Margin: [1:Flat and Intact] [N/A:N/A] Granulation Amount: [1:None Present (0%)] [N/A:N/A] Necrotic Amount: [1:Large (67-100%)] [N/A:N/A] Exposed Structures: [1:Fascia: No Fat: No Tendon: No] [N/A:N/A] Muscle: No Joint: No Bone: No Limited to Skin Breakdown Epithelialization: None N/A N/A Periwound Skin Texture: Edema: Yes N/A N/A Periwound Skin Moist: Yes N/A N/A Moisture: Periwound Skin Color: No Abnormalities Noted N/A N/A Temperature: No Abnormality N/A N/A Tenderness on Yes N/A N/A Palpation: Wound Preparation: Ulcer Cleansing: N/A N/A Rinsed/Irrigated with Saline Topical Anesthetic Applied: Other: lidocaine 4% Treatment Notes Electronic Signature(s) Signed: 04/21/2016 6:42:13 PM By: Alric Quan Entered By: Alric Quan on 04/21/2016 16:03:59 Shroff, [name]  ([id number]) -------------------------------------------------------------------------------- [place] Details Patient Name: Mccaig, Jamarr L. Date of Service: 04/21/2016 3:45 PM Medical Record Number: [id number] Patient Account Number: [id number] Date of Birth/Sex: 1951-02-24 (65 y.o. Male) Treating RN: Ahmed Prima Primary Care Physician: Starlyn Skeans Other Clinician: Referring Physician: Starlyn Skeans Treating Physician/Extender: Frann Rider in Treatment: 3 Active Inactive Abuse / Safety / Falls / Self Care Management Nursing Diagnoses: Potential for falls Goals: Patient will remain injury free Date Initiated: 03/28/2016 Goal Status: Active Interventions: Assess fall risk on admission and as needed Notes: Orientation to the Wound Care Program Nursing Diagnoses: Knowledge deficit related to the wound healing center program Goals: Patient/caregiver will verbalize understanding of the [facility] Date Initiated: 03/28/2016 Goal Status: Active Interventions: Provide education on orientation to the wound center Notes: Pain, Acute or Chronic Nursing Diagnoses: Pain, acute or chronic: actual or potential Potential alteration in comfort, pain Goals: Piazza, Bronte L. ([id number]) Patient will verbalize adequate pain control and receive pain control interventions during procedures as needed Date Initiated: 03/28/2016 Goal Status: Active Interventions: Assess comfort goal upon admission Complete pain assessment as per visit requirements Notes: Wound/Skin Impairment Nursing Diagnoses: Impaired tissue integrity Goals: Ulcer/skin breakdown will have a volume reduction of 30% by week 4 Date Initiated: 03/28/2016 Goal Status: Active Ulcer/skin breakdown will have a volume reduction of 50% by week 8 Date Initiated: 03/28/2016 Goal Status: Active Ulcer/skin breakdown will have a volume reduction of 80% by week 12 Date Initiated: 03/28/2016 Goal  Status: Active Interventions: Assess patient/caregiver ability to obtain necessary supplies Assess ulceration(s) every visit Notes: Electronic Signature(s) Signed: 04/21/2016 6:42:13 PM By: Alric Quan Entered By: Alric Quan on 04/21/2016 16:03:49 Weissinger, [name] ([id number]) -------------------------------------------------------------------------------- Pain Assessment Details Patient Name: Miron, Srijan L. Date of Service: 04/21/2016 3:45 PM Medical Record Number: [id number] Patient Account Number: [id number] Date of Birth/Sex: [birth year] (65 y.o. Male) Treating RN: Ahmed Prima Primary Care Physician: Starlyn Skeans Other Clinician: Referring Physician: Starlyn Skeans Treating Physician/Extender: Frann Rider in Treatment: 3 Active Problems Location of Pain Severity and Description of Pain Patient Has Paino No Site Locations Pain Management and Medication Current Pain Management: Electronic Signature(s) Signed: 04/21/2016 6:42:13 PM By: Alric Quan Entered By: Alric Quan on 04/21/2016 15:47:58 Theurer, [name] ([id number]) -------------------------------------------------------------------------------- Patient/Caregiver Education Details Patient Name: Robison, Decarlos L. Date of Service: 04/21/2016 3:45 PM Medical Record Number: [id number] Patient Account Number: [id number] Date of Birth/Gender: 1951-03-24 (65 y.o. Male) Treating RN: Ahmed Prima Primary Care Physician: Starlyn Skeans Other Clinician: Referring Physician: Starlyn Skeans Treating Physician/Extender: Frann Rider in Treatment: 3 Education Assessment Education Provided To: Patient Education Topics Provided Wound/Skin Impairment: Handouts: Other: change dressings as directed Methods: Demonstration, Explain/Verbal Responses: State content correctly Electronic Signature(s) Signed: 04/21/2016 6:42:13 PM By: Alric Quan Entered By: Alric Quan on 04/21/2016 16:42:58 Aspinall,  Levar L. ([id number]) --------------------------------------------------------------------------------  Wound Assessment Details Patient Name: Gardin, Derek L. Date of Service: 04/21/2016 3:45 PM Medical Record Number: [id number] Patient Account Number: [id number] Date of Birth/Sex: Jan 28, 1951 (65 y.o. Male) Treating RN: Carolyne Fiscal, Debi Primary Care Physician: Starlyn Skeans Other Clinician: Referring Physician: Starlyn Skeans Treating Physician/Extender: Frann Rider in Treatment: 3 Wound Status Wound Number: 1 Primary Open Surgical Wound Etiology: Wound Location: Left Metatarsal head fourth Wound Open Wounding Event: Surgical Injury Status: Date Acquired: 02/26/2016 Comorbid Cataracts, Arrhythmia, Congestive Weeks Of Treatment: 3 History: Heart Failure, Hypertension, Type II Clustered Wound: No Diabetes, Rheumatoid Arthritis, Neuropathy Photos Photo Uploaded By: Alric Quan on 04/21/2016 16:56:25 Wound Measurements Length: (cm) 2.3 Width: (cm) 0.4 Depth: (cm) 0.4 Area: (cm) 0.723 Volume: (cm) 0.289 % Reduction in Area: -9.5% % Reduction in Volume: -337.9% Epithelialization: None Tunneling: No Undermining: No Wound Description Classification: Partial Thickness Foul Odor A Diabetic Severity (Wagner): Grade 1 Wound Margin: Flat and Intact Exudate Amount: Large Exudate Type: Serous Exudate Color: amber fter Cleansing: No Wound Bed Granulation Amount: None Present (0%) Exposed Structure Necrotic Amount: Large (67-100%) Fascia Exposed: No Madero, Hulan L. ([id number]) Necrotic Quality: Adherent Slough Fat Layer Exposed: No Tendon Exposed: No Muscle Exposed: No Joint Exposed: No Bone Exposed: No Limited to Skin Breakdown Periwound Skin Texture Texture Color No Abnormalities Noted: No No Abnormalities Noted: No Localized Edema: Yes Temperature / Pain Moisture Temperature: No Abnormality No Abnormalities Noted: No Tenderness on Palpation: Yes Moist:  Yes Wound Preparation Ulcer Cleansing: Rinsed/Irrigated with Saline Topical Anesthetic Applied: Other: lidocaine 4%, Treatment Notes Wound #1 (Left Metatarsal head fourth) 1. Cleansed with: Clean wound with Normal Saline 2. Anesthetic Topical Lidocaine 4% cream to wound bed prior to debridement 4. Dressing Applied: Hydrogel 5. Secondary Dressing Applied Dry Gauze Foam Kerlix/Conform 7. Secured with Tape Notes conform, foam to the deep tissues injuries Electronic Signature(s) Signed: 04/21/2016 6:42:13 PM By: Alric Quan Entered By: Alric Quan on 04/21/2016 15:59:39 Leitzke, [name] ([id number]) -------------------------------------------------------------------------------- [place] Details Patient Name: Rojek, Ladarryl L. Date of Service: 04/21/2016 3:45 PM Medical Record Number: [id number] Patient Account Number: [id number] Date of Birth/Sex: 10-Nov-1951 (65 y.o. Male) Treating RN: Carolyne Fiscal, Debi Primary Care Physician: Starlyn Skeans Other Clinician: Referring Physician: Starlyn Skeans Treating Physician/Extender: Frann Rider in Treatment: 3 Vital Signs Time Taken: 15:48 Temperature (F): 98.0 Height (in): 69 Pulse (bpm): 73 Respiratory Rate (breaths/min): 20 Blood Pressure (mmHg): 97/49 Reference Range: 80 - 120 mg / dl Electronic Signature(s) Signed: 04/21/2016 6:42:13 PM By: Alric Quan Entered By: Alric Quan on 04/21/2016 15:52:09

## 2016-04-22 NOTE — Progress Notes (Addendum)
CORDAY, VALEK ([id number]) Visit Report for 04/21/2016 Chief Complaint Document Details Patient Name: [name], [name] Date of Service: 04/21/2016 3:45 PM Medical Record Number: [id number] Patient Account Number: [id number] Date of Birth/Sex: [birth year] (65 y.o. Male) Treating RN: Ahmed Prima Primary Care Physician: Starlyn Skeans Other Clinician: Referring Physician: Starlyn Skeans Treating Physician/Extender: Frann Rider in Treatment: 3 Information Obtained from: Patient Chief Complaint Patients presents for treatment of an open diabetic ulcer to the left foot with a nonhealing surgical wound for about a month and a half. Electronic Signature(s) Signed: 04/21/2016 4:13:52 PM By: Christin Fudge MD, FACS Entered By: Christin Fudge on 04/21/2016 16:13:52 Ledvina, [name] ([id number]) -------------------------------------------------------------------------------- Debridement Details Patient Name: [name], [name] Date of Service: 04/21/2016 3:45 PM Medical Record Number: [id number] Patient Account Number: [id number] Date of Birth/Sex: [birth year] (65 y.o. Male) Treating RN: Carolyne Fiscal, Debi Primary Care Physician: Starlyn Skeans Other Clinician: Referring Physician: Starlyn Skeans Treating Physician/Extender: Frann Rider in Treatment: 3 Debridement Performed for Wound #1 Left Metatarsal head fourth Assessment: Performed By: Physician Christin Fudge, MD Debridement: Debridement Pre-procedure Yes Verification/Time Out Taken: Start Time: 16:08 Pain Control: Lidocaine 4% Topical Solution Level: Skin/Subcutaneous Tissue Total Area Debrided (L x 2.3 (cm) x 0.4 (cm) = 0.92 (cm) W): Tissue and other Viable, Non-Viable, Exudate, Fibrin/Slough, Subcutaneous material debrided: Instrument: Forceps Bleeding: Minimum Hemostasis Achieved: Pressure End Time: 16:12 Procedural Pain: 0 Post Procedural Pain: 0 Response to Treatment: Procedure was tolerated well Post Debridement  Measurements of Total Wound Length: (cm) 2.3 Width: (cm) 0.4 Depth: (cm) 0.4 Volume: (cm) 0.289 Post Procedure Diagnosis Same as Pre-procedure Electronic Signature(s) Signed: 04/21/2016 4:28:22 PM By: Christin Fudge MD, FACS Signed: 04/21/2016 6:42:13 PM By: Alric Quan Entered By: Alric Quan on 04/21/2016 16:12:14 Mcilvaine, [name] ([id number]) -------------------------------------------------------------------------------- HPI Details Patient Name: [name], [name] Date of Service: 04/21/2016 3:45 PM Medical Record Number: [id number] Patient Account Number: [id number] Date of Birth/Sex: November 05, 1951 (65 y.o. Male) Treating RN: Ahmed Prima Primary Care Physician: Starlyn Skeans Other Clinician: Referring Physician: Starlyn Skeans Treating Physician/Extender: Frann Rider in Treatment: 3 History of Present Illness Location: wound in the region of the previous surgery where he's had a ray amputation of his left fourth toe Quality: Patient reports experiencing a dull pain to affected area(s). Severity: Patient states wound are getting worse. Duration: Patient has had the wound for > 2 months prior to seeking treatment at the wound center Timing: Pain in wound is Intermittent (comes and goes Context: The wound occurred when the patient was found to have an osteomyelitis of the left fourth toe and had surgery performed at [facility] Modifying Factors: Consults to this date include:angiography and podiatry at [facility] Signs and Symptoms: Patient reports having increase discharge. HPI Description: This 65 year old gentleman was referred to as by Dr. Buddy Duty from [facility], and has been treated there for a diabetic ulcer of the left foot associated with peripheral arterial disease and osteomyelitis due to a history of Wagner grade 3 infection of the left foot. Patient was on oral doxycycline and debridement was done at the location of the left toe  surgical end dictation side. He has been awaiting authorization of hyperbaric oxygen therapy and Regranex. Since he lives closer to our wound center he was referred to [place] for the possibility of hyperbaric oxygen therapy. [name] problem list includes diabetes mellitus type 2, essential hypertension, hammertoe, rheumatoid arthritis, cellulitis and abscess of the toe, current data disease, chronic kidney disease stage III, systolic heart  failure, pancytopenia. 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 Status post left fourth toe ray amputation without evidence of osteomyelitis. The patient also had an arteriogram of the left lower extremity done on 02/09/2016 for previous arterial duplex showing no flow in the PTA and dampened flow in the ATA. The right great toe pressure was 40 mmHg and the left great toe pressure was 38 mmHg. Final impression was runoff disease with single- vessel runoff to the ankle not available to endovascular revascularization. MRI of the foot done on 11/30/2016 showed left fourth toe findings consistent with osteomyelitis involving the distal aspect of the fourth proximal phalanx with osteitis of the fused fourth middle and distal phalanx. There was also diffuse soft tissue swelling of the forefoot without discrete abscess. 04/14/2016 --since I saw him 10 days ago things have changed a little bit where he has dry gangrene on the second toe and on the third toe. He is also been to [facility] and they have got him Regranex, which he is applying once a day. We tried to get in touch with the patient to clarify that [facility] had not got any  authorization for hyperbaric oxygen therapy but the patient was unavailable. We will discuss his insurance coverage today. chest x-ray done on 04/04/2016 was within normal limits. 04/21/2016 -- the patient has not been able to get hyperbaric oxygen therapy yet as his copayment is quite large and he is responsible for 20%. He is awaiting financial aid. ZOE, HONN ([id number]) Electronic Signature(s) Signed: 04/21/2016 4:14:47 PM By: Christin Fudge MD, FACS Entered By: Christin Fudge on 04/21/2016 16:14:46 Behan, [name] ([id number]) -------------------------------------------------------------------------------- Physical Exam Details Patient Name: [name], [name] Date of Service: 04/21/2016 3:45 PM Medical Record Number: [id number] Patient Account Number: [id number] Date of Birth/Sex: [birth year] (65 y.o. Male) Treating RN: Ahmed Prima Primary Care Physician: Starlyn Skeans Other Clinician: Referring Physician: Starlyn Skeans Treating Physician/Extender: Frann Rider in Treatment: 3 Constitutional . Pulse regular. Respirations normal and unlabored. Afebrile. . Eyes Nonicteric. Reactive to light. Ears, Nose, Mouth, and Throat Lips, teeth, and gums WNL.[name] Moist mucosa without lesions. Neck supple and nontender. No palpable supraclavicular or cervical adenopathy. Normal sized without goiter. Respiratory WNL. No retractions.. Cardiovascular Pedal Pulses WNL. No clubbing, cyanosis or edema. Lymphatic No adneopathy. No adenopathy. No adenopathy. Musculoskeletal Adexa without tenderness or enlargement.. Digits and nails w/o clubbing, cyanosis, infection, petechiae, ischemia, or inflammatory conditions.. Integumentary (Hair, Skin) No suspicious lesions. No crepitus or fluctuance. No peri-wound warmth or erythema. No masses.[name] Psychiatric Judgement and insight Intact.. No evidence of depression, anxiety, or agitation.. Notes the patches of dry gangrene on this second and third toe  persist and there are no worse. The wound at the base of the fourth metatarsal head continues to have 2 openings which is being sharply debrided with forceps and I have been able to remove some of the necrotic tissue. Electronic Signature(s) Signed: 04/21/2016 4:15:43 PM By: Christin Fudge MD, FACS Entered By: Christin Fudge on 04/21/2016 16:15:42 Lomanto, [name] ([id number]) -------------------------------------------------------------------------------- Physician Orders Details Patient Name: Wescott, Aime L. Date of Service: 04/21/2016 3:45 PM Medical Record Number: [id number] Patient Account Number: [id number] Date of Birth/Sex: May 24, 1951 (65 y.o. 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 Anesthetic Wound #1 Left Metatarsal head fourth o Topical Lidocaine 4% cream applied to wound bed prior to debridement Skin Barriers/Peri-Wound Care Wound #1 Left Metatarsal head fourth o Skin Prep Primary Wound Dressing Wound #1 Left Metatarsal head fourth o Hydrogel o Foam - ******only to the deep tissue injuries****** Secondary Dressing Wound #1 Left Metatarsal head fourth o Dry Gauze - tape, conform Dressing Change Frequency Wound #1 Left Metatarsal head fourth o Change dressing every day. Follow-up Appointments Wound #1 Left Metatarsal head  fourth Frankl, Boy L. ([id number]) o Return Appointment in 1 week. Edema Control Wound #1 Left Metatarsal head fourth o Elevate legs to the level of the heart and pump ankles as often as possible Electronic Signature(s) Signed: 04/21/2016 4:28:22 PM By: Christin Fudge MD, FACS Signed: 04/21/2016 6:42:13 PM By: Alric Quan Entered By: Alric Quan on 04/21/2016 16:27:59 Julius, [name] ([id number]) -------------------------------------------------------------------------------- Problem List Details Patient Name: [name], [name] Date of Service: 04/21/2016 3:45 PM Medical Record Number: [id number] Patient Account Number: [id number] Date of Birth/Sex: 1951-06-11 (65 y.o. Male) Treating RN: Ahmed Prima Primary Care Physician: Starlyn Skeans Other Clinician: Referring Physician: Starlyn Skeans Treating Physician/Extender: Frann Rider in Treatment: 3 Active Problems ICD-10 Encounter Code Description Active Date Diagnosis E11.621 Type 2 diabetes mellitus with foot ulcer 03/28/2016 Yes L97.522 Non-pressure chronic ulcer of other part of left foot with fat 03/28/2016 Yes layer exposed I73.9 Peripheral vascular disease, unspecified 03/28/2016 Yes I70.245 Atherosclerosis of native arteries of left leg with ulceration 03/28/2016 Yes of other part of foot Inactive Problems Resolved Problems Electronic Signature(s) Signed: 04/21/2016 4:02:52 PM By: Christin Fudge MD, FACS Entered By: Christin Fudge on 04/21/2016 16:02:52 Demo, [name] ([id number]) -------------------------------------------------------------------------------- Progress Note Details Patient Name: [name], [name] Date of Service: 04/21/2016 3:45 PM Medical Record Number: [id number] Patient Account Number: [id number] Date of Birth/Sex: [birth year] (65 y.o. Male) Treating RN: Ahmed Prima Primary Care Physician: Starlyn Skeans Other Clinician: Referring Physician: Starlyn Skeans Treating Physician/Extender: Frann Rider in Treatment: 3 Subjective Chief Complaint Information obtained from Patient Patients presents for treatment of an open diabetic ulcer to the left foot with a nonhealing surgical wound for about a month and a half. History of Present Illness (HPI) The following HPI elements were documented for the patient's wound: Location: wound in the region of the previous surgery where he's had a ray amputation of his left fourth toe Quality: Patient reports experiencing a dull pain to affected area(s). Severity: Patient states wound are getting worse. Duration: Patient has had the wound for > 2 months prior to seeking treatment at the wound center Timing: Pain in wound is Intermittent (comes and goes Context: The wound occurred when the patient was found to have an osteomyelitis of the left fourth toe and had surgery performed at [facility] Modifying Factors: Consults to this date include:angiography and podiatry at [place] Signs and Symptoms: Patient reports having increase discharge. This 65 year old gentleman was referred  to as by Dr. Buddy Duty from [facility], and has been treated there for a diabetic ulcer of the left foot associated with peripheral arterial disease and osteomyelitis due to a history of Wagner grade 3 infection of the left foot. Patient was on oral doxycycline and debridement was done at the location of the left toe surgical end dictation side. He has been awaiting authorization of hyperbaric oxygen therapy and Regranex. Since he lives closer to our wound center he was referred to [place] for the possibility of hyperbaric oxygen therapy. Patient s problem list includes diabetes mellitus type 2, essential hypertension, hammertoe, rheumatoid arthritis, cellulitis and abscess of the toe, current data disease, chronic kidney disease stage III, systolic heart failure, pancytopenia. Past surgical history significant for amputation of the right toe in  2014, right second toe amputation, coronary angioplasty, current artery bypass graft, recent left fourth toe ray amputation X-ray of the left foot done on 02/27/2016 shows no radiographic evidence for osteomyelitis no dissecting soft tissue gas and stable erosive changes suggestive of inflammatory arthropathy. Status post left fourth toe ray amputation without evidence of osteomyelitis. The patient also had an arteriogram of the left lower extremity done on 02/09/2016 for previous arterial duplex showing no flow in the PTA and dampened flow in the ATA. The right great toe pressure was 40 mmHg and the left great toe pressure was 38 mmHg. Final impression was runoff disease with single- vessel runoff to the ankle not available to endovascular revascularization. MRI of the foot done on 11/30/2016 showed left fourth toe findings consistent with osteomyelitis involving the distal aspect of the fourth proximal phalanx with osteitis of the fused fourth middle and distal phalanx. There was also diffuse soft tissue swelling of the forefoot without discrete abscess. JAYVEION, SKOGSTAD ([id number]) 04/14/2016 --since I saw him 10 days ago things have changed a little bit where he has dry gangrene on the second toe and on the third toe. He is also been to [facility] and they have got him Regranex, which he is applying once a day. We tried to get in touch with the patient to clarify that [facility] had not got any authorization for hyperbaric oxygen therapy but the patient was unavailable. We will discuss his insurance coverage today. chest x-ray done on 04/04/2016 was within normal limits. 04/21/2016 -- the patient has not been able to get hyperbaric oxygen therapy yet as his copayment is quite large and he is responsible for 20%. He is awaiting financial aid. Objective Constitutional Pulse regular. Respirations normal and unlabored. Afebrile. Vitals Time Taken: 3:48 PM, Height: 69 in, Temperature: 98.0 F,  Pulse: 73 bpm, Respiratory Rate: 20 breaths/min, Blood Pressure: 97/49 mmHg. Eyes Nonicteric. Reactive to light. Ears, Nose, Mouth, and Throat Lips, teeth, and gums WNL.[name] Moist mucosa without lesions. Neck supple and nontender. No palpable supraclavicular or cervical adenopathy. Normal sized without goiter. Respiratory WNL. No retractions.. Cardiovascular Pedal Pulses WNL. No clubbing, cyanosis or edema. Lymphatic No adneopathy. No adenopathy. No adenopathy. Musculoskeletal Adexa without tenderness or enlargement.. Digits and nails w/o clubbing, cyanosis, infection, petechiae, ischemia, or inflammatory conditions.[name] Psychiatric Judgement and insight Intact.. No evidence of depression, anxiety, or agitation.. General Notes: the patches of dry gangrene on this second and third toe persist and there are no worse. Macnair, ANABEL HOLLERBACH ([id number]) The wound at the base of the fourth metatarsal head continues to have 2 openings which is being sharply debrided with forceps and I have been able to remove some of  the necrotic tissue. Integumentary (Hair, Skin) No suspicious lesions. No crepitus or fluctuance. No peri-wound warmth or erythema. No masses.. Wound #1 status is Open. Original cause of wound was Surgical Injury. The wound is located on the Left Metatarsal head fourth. The wound measures 2.3cm length x 0.4cm width x 0.4cm depth; 0.723cm^2 area and 0.289cm^3 volume. The wound is limited to skin breakdown. There is no tunneling or undermining noted. There is a large amount of serous drainage noted. The wound margin is flat and intact. There is no granulation within the wound bed. There is a large (67-100%) amount of necrotic tissue within the wound bed including Adherent Slough. The periwound skin appearance exhibited: Localized Edema, Moist. Periwound temperature was noted as No Abnormality. The periwound has tenderness on palpation. Assessment Active Problems ICD-10 E11.621 - Type 2  diabetes mellitus with foot ulcer L97.522 - Non-pressure chronic ulcer of other part of left foot with fat layer exposed I73.9 - Peripheral vascular disease, unspecified I70.245 - Atherosclerosis of native arteries of left leg with ulceration of other part of foot Procedures Wound #1 Wound #1 is an Open Surgical Wound located on the Left Metatarsal head fourth . There was a Skin/Subcutaneous Tissue Debridement [id number]) debridement with total area of 0.92 sq cm performed by Christin Fudge, MD. with the following instrument(s): Forceps to remove Viable and Non-Viable tissue/material including Exudate, Fibrin/Slough, and Subcutaneous after achieving pain control using Lidocaine 4% Topical Solution. A time out was conducted prior to the start of the procedure. A Minimum amount of bleeding was controlled with Pressure. The procedure was tolerated well with a pain level of 0 throughout and a pain level of 0 following the procedure. Post Debridement Measurements: 2.3cm length x 0.4cm width x 0.4cm depth; 0.289cm^3 volume. Post procedure Diagnosis Wound #1: Same as Pre-Procedure Rehfeld, Kye L. ([id number]) Plan Wound Cleansing: Wound #1 Left Metatarsal head fourth: Clean wound with Normal Saline. Anesthetic: Wound #1 Left Metatarsal head fourth: Topical Lidocaine 4% cream applied to wound bed prior to debridement Skin Barriers/Peri-Wound Care: Wound #1 Left Metatarsal head fourth: Skin Prep Primary Wound Dressing: Wound #1 Left Metatarsal head fourth: Hydrogel Foam - ******only to the deep tissue injuries****** Secondary Dressing: Wound #1 Left Metatarsal head fourth: Dry Gauze - tape, conform Dressing Change Frequency: Wound #1 Left Metatarsal head fourth: Change dressing every day. Follow-up Appointments: Wound #1 Left Metatarsal head fourth: Return Appointment in 1 week. Edema Control: Wound #1 Left Metatarsal head fourth: Elevate legs to the level of the heart and pump ankles  as often as possible At the present time the patient has been using Regranex as per instructions from [facility] and I have asked him to continue with this. We will work on his authorization for hyperbaric oxygen therapy and at the present time he seems to have a diabetic foot ulcer which could be graded as a Wagner 4 because of the dry gangrene he has at the third and second toe. He will work with my HBO tech today for orientation and insurance authorization. he is responsible for 20% of the HBO cost and hence we are working with financial aid to get him authorized. Electronic Signature(s) Signed: 04/22/2016 12:42:26 PM By: Christin Fudge MD, FACS Previous Signature: 04/21/2016 4:16:25 PM Version By: Christin Fudge MD, FACS [place], [place] ([id number]) Entered By: Christin Fudge on 04/22/2016 12:42:26 Soley, [name] ([id number]) -------------------------------------------------------------------------------- SuperBill Details Patient Name: [name], [name] Date of Service: 04/21/2016 Medical Record Number: [id number] Patient Account Number: [id number] Date  of Birth/Sex: September 18, 1951 (65 y.o. Male) Treating RN: Carolyne Fiscal, Debi Primary Care Physician: Starlyn Skeans Other Clinician: Referring Physician: Starlyn Skeans Treating Physician/Extender: Frann Rider in Treatment: 3 Diagnosis Coding ICD-10 Codes Code Description E11.621 Type 2 diabetes mellitus with foot ulcer L97.522 Non-pressure chronic ulcer of other part of left foot with fat layer exposed I73.9 Peripheral vascular disease, unspecified I70.245 Atherosclerosis of native arteries of left leg with ulceration of other part of foot Facility Procedures CPT4: Description Modifier Quantity Code [id number] - DEB SUBQ TISSUE 20 SQ CM/< 1 ICD-10 Description Diagnosis E11.621 Type 2 diabetes mellitus with foot ulcer L97.522 Non-pressure chronic ulcer of other part of left foot with fat layer exposed  I73.9 Peripheral vascular  disease, unspecified I70.245 Atherosclerosis of native arteries of left leg with ulceration of other part of foot Physician Procedures CPT4: Description Modifier Quantity Code [id number] - WC PHYS SUBQ TISS 20 SQ CM 1 ICD-10 Description Diagnosis E11.621 Type 2 diabetes mellitus with foot ulcer L97.522 Non-pressure chronic ulcer of other part of left foot with fat layer exposed I73.9  Peripheral vascular disease, unspecified I70.245 Atherosclerosis of native arteries of left leg with ulceration of other part of foot Electronic Signature(s) Signed: 04/21/2016 4:16:38 PM By: Christin Fudge MD, FACS Demartini, [name] ([id number]) Entered By: Christin Fudge on 04/21/2016 16:16:37

## 2016-04-27 DIAGNOSIS — E11621 Type 2 diabetes mellitus with foot ulcer: Secondary | ICD-10-CM | POA: Diagnosis not present

## 2016-04-27 DIAGNOSIS — Z89422 Acquired absence of other left toe(s): Secondary | ICD-10-CM | POA: Insufficient documentation

## 2016-04-27 DIAGNOSIS — E11319 Type 2 diabetes mellitus with unspecified diabetic retinopathy without macular edema: Secondary | ICD-10-CM | POA: Diagnosis not present

## 2016-04-27 DIAGNOSIS — D638 Anemia in other chronic diseases classified elsewhere: Secondary | ICD-10-CM | POA: Insufficient documentation

## 2016-04-27 DIAGNOSIS — Z01810 Encounter for preprocedural cardiovascular examination: Secondary | ICD-10-CM | POA: Diagnosis not present

## 2016-04-27 DIAGNOSIS — I10 Essential (primary) hypertension: Secondary | ICD-10-CM | POA: Diagnosis not present

## 2016-04-27 DIAGNOSIS — M069 Rheumatoid arthritis, unspecified: Secondary | ICD-10-CM | POA: Diagnosis not present

## 2016-04-27 DIAGNOSIS — Z794 Long term (current) use of insulin: Secondary | ICD-10-CM | POA: Diagnosis not present

## 2016-04-27 DIAGNOSIS — E7800 Pure hypercholesterolemia, unspecified: Secondary | ICD-10-CM | POA: Diagnosis not present

## 2016-04-27 DIAGNOSIS — E1136 Type 2 diabetes mellitus with diabetic cataract: Secondary | ICD-10-CM | POA: Diagnosis not present

## 2016-04-27 DIAGNOSIS — Z89429 Acquired absence of other toe(s), unspecified side: Secondary | ICD-10-CM | POA: Diagnosis not present

## 2016-04-27 DIAGNOSIS — I998 Other disorder of circulatory system: Secondary | ICD-10-CM | POA: Diagnosis not present

## 2016-04-27 DIAGNOSIS — Z951 Presence of aortocoronary bypass graft: Secondary | ICD-10-CM | POA: Diagnosis not present

## 2016-04-27 DIAGNOSIS — I2510 Atherosclerotic heart disease of native coronary artery without angina pectoris: Secondary | ICD-10-CM | POA: Diagnosis not present

## 2016-04-27 DIAGNOSIS — I252 Old myocardial infarction: Secondary | ICD-10-CM | POA: Diagnosis not present

## 2016-04-27 DIAGNOSIS — E118 Type 2 diabetes mellitus with unspecified complications: Secondary | ICD-10-CM | POA: Diagnosis not present

## 2016-04-28 ENCOUNTER — Encounter: Payer: Medicare Other | Attending: Surgery | Admitting: Surgery

## 2016-04-28 DIAGNOSIS — I739 Peripheral vascular disease, unspecified: Secondary | ICD-10-CM | POA: Insufficient documentation

## 2016-04-28 DIAGNOSIS — M069 Rheumatoid arthritis, unspecified: Secondary | ICD-10-CM | POA: Diagnosis not present

## 2016-04-28 DIAGNOSIS — L97522 Non-pressure chronic ulcer of other part of left foot with fat layer exposed: Secondary | ICD-10-CM | POA: Insufficient documentation

## 2016-04-28 DIAGNOSIS — Z882 Allergy status to sulfonamides status: Secondary | ICD-10-CM | POA: Diagnosis not present

## 2016-04-28 DIAGNOSIS — N183 Chronic kidney disease, stage 3 (moderate): Secondary | ICD-10-CM | POA: Diagnosis not present

## 2016-04-28 DIAGNOSIS — I5022 Chronic systolic (congestive) heart failure: Secondary | ICD-10-CM | POA: Diagnosis not present

## 2016-04-28 DIAGNOSIS — Z89421 Acquired absence of other right toe(s): Secondary | ICD-10-CM | POA: Insufficient documentation

## 2016-04-28 DIAGNOSIS — Z79899 Other long term (current) drug therapy: Secondary | ICD-10-CM | POA: Diagnosis not present

## 2016-04-28 DIAGNOSIS — E11311 Type 2 diabetes mellitus with unspecified diabetic retinopathy with macular edema: Secondary | ICD-10-CM | POA: Insufficient documentation

## 2016-04-28 DIAGNOSIS — I130 Hypertensive heart and chronic kidney disease with heart failure and stage 1 through stage 4 chronic kidney disease, or unspecified chronic kidney disease: Secondary | ICD-10-CM | POA: Diagnosis not present

## 2016-04-28 DIAGNOSIS — I70245 Atherosclerosis of native arteries of left leg with ulceration of other part of foot: Secondary | ICD-10-CM | POA: Insufficient documentation

## 2016-04-28 DIAGNOSIS — E1140 Type 2 diabetes mellitus with diabetic neuropathy, unspecified: Secondary | ICD-10-CM | POA: Diagnosis not present

## 2016-04-28 DIAGNOSIS — E11621 Type 2 diabetes mellitus with foot ulcer: Secondary | ICD-10-CM | POA: Diagnosis not present

## 2016-04-28 DIAGNOSIS — E1122 Type 2 diabetes mellitus with diabetic chronic kidney disease: Secondary | ICD-10-CM | POA: Insufficient documentation

## 2016-04-28 DIAGNOSIS — Z7982 Long term (current) use of aspirin: Secondary | ICD-10-CM | POA: Diagnosis not present

## 2016-04-28 DIAGNOSIS — T8131XA Disruption of external operation (surgical) wound, not elsewhere classified, initial encounter: Secondary | ICD-10-CM | POA: Diagnosis not present

## 2016-04-28 DIAGNOSIS — Z794 Long term (current) use of insulin: Secondary | ICD-10-CM | POA: Insufficient documentation

## 2016-04-28 DIAGNOSIS — D61818 Other pancytopenia: Secondary | ICD-10-CM | POA: Diagnosis not present

## 2016-04-28 NOTE — Progress Notes (Addendum)
[name], [name] ([id number]) Visit Report for 04/28/2016 Chief Complaint Document Details Patient Name: [name], [name] Date of Service: 04/28/2016 3:45 PM Medical Record Number: [id number] Patient Account Number: [id number] Date of Birth/Sex: 03/13/1951 (65 y.o. Male) Treating RN: [name] Primary Care Physician: [name] Other Clinician: Referring Physician: Starlyn [name] Treating Physician/Extender: [name] in Treatment: 4 Information Obtained from: Patient Chief Complaint Patients presents for treatment of an open diabetic ulcer to the left foot with a nonhealing surgical wound for about a month and a half. Electronic Signature(s) Signed: 04/28/2016 3:55:10 PM By: [name] Fudge MD, FACS Entered By: [name] [name] 04/28/2016 15:55:10 [name], [name] ([id number]) -------------------------------------------------------------------------------- Debridement Details Patient Name: [name], [name] Date of Service: 04/28/2016 3:45 PM Medical Record Number: [id number] Patient Account Number: [id number] Date of Birth/Sex: [birth year] (65 y.o. Male) Treating RN: Carolyne Fiscal, Debi Primary Care Physician: [name] Other Clinician: Referring Physician: Starlyn [name] Treating Physician/Extender: [name] in Treatment: 4 Debridement Performed for Wound #1 Left Metatarsal head fourth Assessment: Performed By: Physician [name] Fudge, MD Debridement: Debridement Pre-procedure Yes Verification/Time Out Taken: Start Time: 16:20 Pain Control: Lidocaine 4% Topical Solution Level: Skin/Subcutaneous Tissue Total Area Debrided (L x 2.5 (cm) x 0.6 (cm) = 1.5 (cm) W): Tissue and other Viable, Non-Viable, Exudate, Fibrin/Slough, Subcutaneous material debrided: Instrument: Forceps, Scissors Bleeding: Minimum Hemostasis Achieved: Pressure End Time: 16:24 Procedural Pain: 0 Post Procedural Pain: 0 Response to Treatment: Procedure was tolerated well Post Debridement  Measurements of Total Wound Length: (cm) 2.5 Width: (cm) 0.6 Depth: (cm) 0.7 Volume: (cm) 0.825 Post Procedure Diagnosis Same as Pre-procedure Electronic Signature(s) Signed: 04/28/2016 4:29:13 PM By: [name] Fudge MD, FACS Signed: 04/28/2016 5:52:45 PM By: [name] Entered By: [name] 04/28/2016 16:24:31 [name], [name] ([id number]) -------------------------------------------------------------------------------- HPI Details Patient Name: [name], [name] Date of Service: 04/28/2016 3:45 PM Medical Record Number: [id number] Patient Account Number: [id number] Date of Birth/Sex: 10-14-1951 (65 y.o. Male) Treating RN: [name] Primary Care Physician: [name] Other Clinician: Referring Physician: Starlyn [name] Treating Physician/Extender: [name] in Treatment: 4 History of Present Illness Location: wound in the region of the previous surgery where he's had a ray amputation of his left fourth toe Quality: Patient reports experiencing a dull pain to affected area(s). Severity: Patient states wound are getting worse. Duration: Patient has had the wound for > 2 months prior to seeking treatment at the wound center Timing: Pain in wound is Intermittent (comes and goes Context: The wound occurred when the patient was found to have an osteomyelitis of the left fourth toe and had surgery performed at [facility] Modifying Factors: Consults to this date include:angiography and podiatry at [place] Signs and Symptoms: Patient reports having increase discharge. HPI Description: This 65 year old gentleman was referred to as by [name] from [facility], and has been treated there for a diabetic ulcer of the left foot associated with peripheral arterial disease and osteomyelitis due to a history of Wagner grade 3 infection of the left foot. Patient was [name] oral doxycycline and debridement was done at the location of the left toe surgical  end dictation side. He has been awaiting authorization of hyperbaric oxygen therapy and Regranex. Since he lives closer to our wound center he was referred to [place] for the possibility of hyperbaric oxygen therapy. [name] problem list includes diabetes mellitus type 2, essential hypertension, hammertoe, rheumatoid arthritis, cellulitis and abscess of the toe, current data disease, chronic kidney disease stage III, systolic  heart failure, pancytopenia. 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 Status post left fourth toe ray amputation without evidence of osteomyelitis. The patient also had an arteriogram of the left lower extremity done [name] 02/09/2016 for previous arterial duplex showing no flow in the PTA and dampened flow in the ATA. The right great toe pressure was 40 mmHg and the left great toe pressure was 38 mmHg. Final impression was runoff disease with single- vessel runoff to the ankle not available to endovascular revascularization. MRI of the foot done [name] 11/30/2016 showed left fourth toe findings consistent with osteomyelitis involving the distal aspect of the fourth proximal phalanx with osteitis of the fused fourth middle and distal phalanx. There was also diffuse soft tissue swelling of the forefoot without discrete abscess. 04/14/2016 --since I saw him 10 days ago things have changed a little bit where he has dry gangrene [name] the second toe and [name] the third toe. He is also been to [facility] and they have got him Regranex, which he is applying once a day. We tried to get in touch with the patient to clarify that [facility] had not got any  authorization for hyperbaric oxygen therapy but the patient was unavailable. We will discuss his insurance coverage today. chest x-ray done [name] 04/04/2016 was within normal limits. 04/21/2016 -- the patient has not been able to get hyperbaric oxygen therapy yet as his copayment is quite large and he is responsible for 20%. He is awaiting financial aid. DILLARD, HINRICHSEN ([id number]) Electronic Signature(s) Signed: 04/28/2016 3:55:27 PM By: [name] Fudge MD, FACS Entered By: [name] [name] 04/28/2016 15:55:26 Mcburney, [name] ([id number]) -------------------------------------------------------------------------------- Physical Exam Details Patient Name: [name], [name] Date of Service: 04/28/2016 3:45 PM Medical Record Number: [id number] Patient Account Number: [id number] Date of Birth/Sex: [birth year] (65 y.o. Male) Treating RN: [name] Primary Care Physician: [name] Other Clinician: Referring Physician: Starlyn [name] Treating Physician/Extender: [name] in Treatment: 4 Constitutional . Pulse regular. Respirations normal and unlabored. Afebrile. . Eyes Nonicteric. Reactive to light. Ears, Nose, Mouth, and Throat Lips, teeth, and gums WNL.[name] Moist mucosa without lesions. Neck supple and nontender. No palpable supraclavicular or cervical adenopathy. Normal sized without goiter. Respiratory WNL. No retractions.. Cardiovascular Pedal Pulses WNL. No clubbing, cyanosis or edema. Lymphatic No adneopathy. No adenopathy. No adenopathy. Musculoskeletal Adexa without tenderness or enlargement.. Digits and nails w/o clubbing, cyanosis, infection, petechiae, ischemia, or inflammatory conditions.. Integumentary (Hair, Skin) No suspicious lesions. No crepitus or fluctuance. No peri-wound warmth or erythema. No masses.[name] Psychiatric Judgement and insight Intact.. No evidence of depression, anxiety, or agitation.. Notes The patches of dry gangrene [name] this second and third toe  persist and there are no worse. The wound at the base of the fourth metatarsal head continues to have 2 openings which is being sharply debrided with forceps and I have been able to remove some of the necrotic tissue. Electronic Signature(s) Signed: 04/28/2016 4:26:43 PM By: [name] Fudge MD, FACS Previous Signature: 04/28/2016 4:04:36 PM Version By: [name] Fudge MD, FACS Entered By: [name] [name] 04/28/2016 16:26:42 Burkland, [name] ([id number]) -------------------------------------------------------------------------------- Physician Orders Details Patient Name: [name], [name] Date of Service: 04/28/2016 3:45 PM Medical Record Number: [id number] Patient Account  Number: [id number] Date of Birth/Sex: 06-11-1951 (65 y.o. 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 Anesthetic Wound #1 Left Metatarsal head fourth o Topical Lidocaine 4% cream applied to wound bed prior to debridement Skin Barriers/Peri-Wound Care Wound #1 Left Metatarsal head fourth o Skin Prep Primary Wound Dressing Wound #1 Left Metatarsal head fourth o Hydrogel o Foam - ******only to the deep tissue injuries****** o Other: - *******Betadine paint to deep tissue injuries********** Secondary Dressing Wound #1 Left Metatarsal head fourth o Dry Gauze - tape, conform Dressing  Change Frequency Wound #1 Left Metatarsal head fourth o Change dressing every day. Follow-up Appointments KERN, AHO ([id number]) Wound #1 Left Metatarsal head fourth o Return Appointment in 1 week. Edema Control Wound #1 Left Metatarsal head fourth o Elevate legs to the level of the heart and pump ankles as often as possible Electronic Signature(s) Signed: 04/28/2016 4:29:13 PM By: [name] Fudge MD, FACS Signed: 04/28/2016 5:52:45 PM By: [name] Entered By: [name] 04/28/2016 16:25:45 Purington, Geron Carlean Jews ([id number]) -------------------------------------------------------------------------------- Problem List Details Patient Name: [name], [name] Date of Service: 04/28/2016 3:45 PM Medical Record Number: [id number] Patient Account Number: [id number] Date of Birth/Sex: 10-10-1951 (65 y.o. Male) Treating RN: [name] Primary Care Physician: [name] Other Clinician: Referring Physician: Starlyn [name] Treating Physician/Extender: [name] in Treatment: 4 Active Problems ICD-10 Encounter Code Description Active Date Diagnosis E11.621 Type 2 diabetes mellitus with foot ulcer 03/28/2016 Yes L97.522 Non-pressure chronic ulcer of other part of left foot with fat 03/28/2016 Yes layer exposed I73.9 Peripheral vascular disease, unspecified 03/28/2016 Yes I70.245 Atherosclerosis of native arteries of left leg with ulceration 03/28/2016 Yes of other part of foot Inactive Problems Resolved Problems Electronic Signature(s) Signed: 04/28/2016 3:55:02 PM By: [name] Fudge MD, FACS Entered By: [name] [name] 04/28/2016 15:55:01 Calo, [name] ([id number]) -------------------------------------------------------------------------------- Progress Note Details Patient Name: [name], [name] Date of Service: 04/28/2016 3:45 PM Medical Record Number: [id number] Patient Account Number: [id number] Date of Birth/Sex: 1951-11-04 (65 y.o. Male) Treating RN: [name] Primary Care Physician: [name] Other Clinician: Referring Physician: Starlyn [name] Treating Physician/Extender: [name] in Treatment: 4 Subjective Chief Complaint Information obtained from Patient Patients presents for treatment of an open diabetic ulcer to the left foot with a nonhealing surgical wound for about a month and a half. History of Present Illness (HPI) The following HPI elements were documented for the patient's wound: Location: wound in the region of the previous surgery where he's had a ray amputation of his left fourth toe Quality: Patient reports experiencing a dull pain to affected area(s). Severity: Patient states wound are getting worse. Duration: Patient has had the wound for > 2 months prior to seeking treatment at the wound center Timing: Pain in wound is Intermittent (comes and goes Context: The wound occurred when the patient was found to have an osteomyelitis of the left fourth toe and had surgery performed at [facility] Modifying Factors: Consults to this date  include:angiography and podiatry at [place] Signs and Symptoms: Patient reports having increase discharge. This 65 year old gentleman was referred to as by [name] from [facility], and has been treated there for a diabetic ulcer of the left foot associated with peripheral arterial disease and osteomyelitis due to a history of Wagner grade 3 infection of the left foot. Patient was [name] oral doxycycline and debridement was done at the location of the left toe surgical end dictation side. He has been awaiting authorization of hyperbaric oxygen therapy and Regranex. Since he lives closer to our wound center he was referred to [place] for the possibility of hyperbaric oxygen therapy. Patient s problem list includes diabetes mellitus type 2, essential hypertension, hammertoe, rheumatoid arthritis, cellulitis and abscess of the toe, current data disease, chronic  kidney disease stage III, systolic heart failure, pancytopenia. 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 Status post left fourth toe ray amputation without evidence of osteomyelitis. The patient also had an arteriogram of the left lower extremity done [name] 02/09/2016 for previous arterial duplex showing no flow in the PTA and dampened flow in the ATA. The right great toe pressure was 40 mmHg and the left great toe pressure was 38 mmHg. Final impression was runoff disease with single- vessel runoff to the ankle not available to endovascular revascularization. MRI of the foot done [name] 11/30/2016 showed left fourth toe findings consistent with osteomyelitis involving the distal aspect of the fourth proximal phalanx with osteitis of the fused fourth middle and distal phalanx. There was also diffuse soft tissue swelling of the forefoot without discrete abscess. [name], [name] ([id number]) 04/14/2016 --since I saw him 10 days ago things have changed a little bit where he has dry gangrene [name] the second toe and [name] the third toe. He is also been to [facility] and they have got him Regranex, which he is applying once a day. We tried to get in touch with the patient to clarify that [facility] had not got any authorization for hyperbaric oxygen therapy but the patient was unavailable. We will discuss his insurance coverage today. chest x-ray done [name] 04/04/2016 was within normal limits. 04/21/2016 -- the patient has not been able to get hyperbaric oxygen therapy yet as his copayment is quite large and he is responsible for 20%. He is awaiting financial  aid. Objective Constitutional Pulse regular. Respirations normal and unlabored. Afebrile. Vitals Time Taken: 4:04 PM, Height: 69 in, Temperature: 97.5 F, Pulse: 77 bpm, Respiratory Rate: 20 breaths/min, Blood Pressure: 105/54 mmHg. Eyes Nonicteric. Reactive to light. Ears, Nose, Mouth, and Throat Lips, teeth, and gums WNL.[name] Moist mucosa without lesions. Neck supple and nontender. No palpable supraclavicular or cervical adenopathy. Normal sized without goiter. Respiratory WNL. No retractions.. Cardiovascular Pedal Pulses WNL. No clubbing, cyanosis or edema. Lymphatic No adneopathy. No adenopathy. No adenopathy. Musculoskeletal Adexa without tenderness or enlargement.. Digits and nails w/o clubbing, cyanosis, infection, petechiae, ischemia, or inflammatory conditions.[name] Psychiatric Judgement and insight Intact.. No evidence of depression, anxiety, or agitation.. General Notes: The patches of dry gangrene [name] this second and third toe persist and there are no worse. [name], [name] ([id number]) The wound at the base of the fourth metatarsal head  continues to have 2 openings which is being sharply debrided with forceps and I have been able to remove some of the necrotic tissue. Integumentary (Hair, Skin) No suspicious lesions. No crepitus or fluctuance. No peri-wound warmth or erythema. No masses.. Wound #1 status is Open. Original cause of wound was Surgical Injury. The wound is located [name] the Left Metatarsal head fourth. The wound measures 2.5cm length x 0.6cm width x 0.7cm depth; 1.178cm^2 area and 0.825cm^3 volume. The wound is limited to skin breakdown. There is no tunneling or undermining noted. There is a large amount of serous drainage noted. The wound margin is flat and intact. There is no granulation within the wound bed. There is a large (67-100%) amount of necrotic tissue within the wound bed including Adherent Slough. The periwound skin appearance exhibited: Localized Edema,  Moist. Periwound temperature was noted as No Abnormality. The periwound has tenderness [name] palpation. Assessment Active Problems ICD-10 E11.621 - Type 2 diabetes mellitus with foot ulcer L97.522 - Non-pressure chronic ulcer of other part of left foot with fat layer exposed I73.9 - Peripheral vascular disease, unspecified I70.245 - Atherosclerosis of native arteries of left leg with ulceration of other part of foot Procedures Wound #1 Wound #1 is an Open Surgical Wound located [name] the Left Metatarsal head fourth . There was a Skin/Subcutaneous Tissue Debridement [id number]) debridement with total area of 1.5 sq cm performed by [name] Fudge, MD. with the following instrument(s): Forceps and Scissors to remove Viable and Non- Viable tissue/material including Exudate, Fibrin/Slough, and Subcutaneous after achieving pain control using Lidocaine 4% Topical Solution. A time out was conducted prior to the start of the procedure. A Minimum amount of bleeding was controlled with Pressure. The procedure was tolerated well with a pain level of 0 throughout and a pain level of 0 following the procedure. Post Debridement Measurements: 2.5cm length x 0.6cm width x 0.7cm depth; 0.825cm^3 volume. Post procedure Diagnosis Wound #1: Same as Pre-Procedure [name], [name] ([id number]) Plan Wound Cleansing: Wound #1 Left Metatarsal head fourth: Clean wound with Normal Saline. Anesthetic: Wound #1 Left Metatarsal head fourth: Topical Lidocaine 4% cream applied to wound bed prior to debridement Skin Barriers/Peri-Wound Care: Wound #1 Left Metatarsal head fourth: Skin Prep Primary Wound Dressing: Wound #1 Left Metatarsal head fourth: Hydrogel Foam - ******only to the deep tissue injuries****** Other: - *******Betadine paint to deep tissue injuries********** Secondary Dressing: Wound #1 Left Metatarsal head fourth: Dry Gauze - tape, conform Dressing Change Frequency: Wound #1 Left Metatarsal head  fourth: Change dressing every day. Follow-up Appointments: Wound #1 Left Metatarsal head fourth: Return Appointment in 1 week. Edema Control: Wound #1 Left Metatarsal head fourth: Elevate legs to the level of the heart and pump ankles as often as possible We still do not have a clearance from his insurance company and from financial aid regarding his hyperbaric oxygen therapy. In the meanwhile we will continue with Regranex as per the [facility] and see him back next week. Electronic Signature(s) Signed: 04/28/2016 4:27:33 PM By: [name] Fudge MD, FACS Entered By: [name] [name] 04/28/2016 16:27:32 Shepard, [name] ([id number]) -------------------------------------------------------------------------------- SuperBill Details Patient Name: [name], [name] Date of Service: 04/28/2016 Medical Record Number: [id number] Patient Account Number: [id number] Date of Birth/Sex: 05-26-1951 (65 y.o. Male) Treating RN: [name] Primary Care Physician: [name] Other Clinician: Referring Physician: Starlyn [name] Treating Physician/Extender: [name] in Treatment: 4 Diagnosis Coding ICD-10 Codes Code Description E11.621 Type 2 diabetes mellitus with foot ulcer L97.522 Non-pressure chronic  ulcer of other part of left foot with fat layer exposed I73.9 Peripheral vascular disease, unspecified I70.245 Atherosclerosis of native arteries of left leg with ulceration of other part of foot Facility Procedures CPT4: Description Modifier Quantity Code [id number] - DEB SUBQ TISSUE 20 SQ CM/< 1 ICD-10 Description Diagnosis E11.621 Type 2 diabetes mellitus with foot ulcer L97.522 Non-pressure chronic ulcer of other part of left foot with fat layer exposed  I73.9 Peripheral vascular disease, unspecified I70.245 Atherosclerosis of native arteries of left leg with ulceration of other part of foot Physician Procedures CPT4: Description Modifier Quantity Code [id number] -  WC PHYS SUBQ TISS 20 SQ CM 1 ICD-10 Description Diagnosis E11.621 Type 2 diabetes mellitus with foot ulcer L97.522 Non-pressure chronic ulcer of other part of left foot with fat layer exposed I73.9  Peripheral vascular disease, unspecified I70.245 Atherosclerosis of native arteries of left leg with ulceration of other part of foot Electronic Signature(s) Signed: 04/28/2016 4:27:46 PM By: [name] Fudge MD, FACS Lore, [name] ([id number]) Entered By: [name] [name] 04/28/2016 16:27:45

## 2016-04-28 NOTE — Progress Notes (Addendum)
[name] [name] ([id number]) Visit Report for 04/28/2016 Arrival Information [name] Patient Name: [name] [name] [name] Date of Service: 04/28/2016 3:45 PM Medical Record Number: [id number] Patient Account Number: [id number] Date of Birth/Sex: [birth year] (65 y.o. Male) Treating RN: Ahmed Prima Primary Care Physician: [name] [name] Other Clinician: Referring Physician: Starlyn [name] Treating Physician/Extender: Frann Rider in Treatment: 4 Visit Information History Since Last Visit All ordered tests and consults were completed: No Patient Arrived: Wheel Chair Added or deleted any medications: No Arrival Time: 16:02 Any new allergies or adverse reactions: No Accompanied By: wife Had a fall or experienced change in No Transfer Assistance: [employer] activities of daily living that may affect Patient Lift risk of falls: Patient Identification Verified: Yes Signs or symptoms of abuse/neglect since last No Secondary Verification Process Yes visito Completed: Hospitalized since last visit: No Patient Requires Transmission- No Pain Present Now: No Based Precautions: Patient Has Alerts: Yes Patient Alerts: DM II Electronic Signature(s) Signed: 04/28/2016 5:52:45 PM By: Alric Quan Entered By: Alric Quan on 04/28/2016 16:04:07 [name] [name] ([id number]) -------------------------------------------------------------------------------- Encounter Discharge Information [name] Patient Name: [name] [name] L. Date of Service: 04/28/2016 3:45 PM Medical Record Number: [id number] Patient Account Number: [id number] Date of Birth/Sex: Jul 18, 1951 (65 y.o. Male) Treating RN: Ahmed Prima Primary Care Physician: [name] [name] Other Clinician: Referring Physician: Starlyn [name] Treating Physician/Extender: Frann Rider in Treatment: 4 Encounter Discharge Information Items Discharge Pain Level: 0 Discharge Condition: Stable Ambulatory Status: Wheelchair Discharge Destination:  Home Transportation: Private Auto Accompanied By: wife Schedule Follow-up Appointment: Yes Medication Reconciliation completed Yes and provided to Patient/Care Arilyn Brierley: Provided on Clinical Summary of Care: 04/28/2016 Form Type Recipient Paper Patient JP Electronic Signature(s) Signed: 04/28/2016 4:38:58 PM By: Ruthine Dose Entered By: Ruthine Dose on 04/28/2016 16:38:58 [name] [name] ([id number]) -------------------------------------------------------------------------------- Lower Extremity Assessment [name] Patient Name: [name] [name] L. Date of Service: 04/28/2016 3:45 PM Medical Record Number: [id number] Patient Account Number: [id number] Date of Birth/Sex: [birth year] (65 y.o. Male) Treating RN: Ahmed Prima Primary Care Physician: [name] [name] Other Clinician: Referring Physician: Starlyn [name] Treating Physician/Extender: Frann Rider in Treatment: 4 Vascular Assessment Pulses: Posterior Tibial Dorsalis Pedis Palpable: [Left:Yes] Extremity colors, hair growth, and conditions: Extremity Color: [Left:Normal] Temperature of Extremity: [Left:Warm] Capillary Refill: [Left:< 3 seconds] Toe Nail Assessment Left: Right: Thick: No Discolored: No Deformed: Yes Improper Length and Hygiene: No Electronic Signature(s) Signed: 04/28/2016 5:52:45 PM By: Alric Quan Entered By: Alric Quan on 04/28/2016 16:07:29 [name] [name] ([id number]) -------------------------------------------------------------------------------- Multi Wound Chart [name] Patient Name: [name] [name] L. Date of Service: 04/28/2016 3:45 PM Medical Record Number: [id number] Patient Account Number: [id number] Date of Birth/Sex: [birth year] (65 y.o. Male) Treating RN: Carolyne Fiscal, Debi Primary Care Physician: [name] [name] Other Clinician: Referring Physician: Starlyn [name] Treating Physician/Extender: Frann Rider in Treatment: 4 Vital Signs Height(in): 69 Pulse(bpm): 77 Weight(lbs):  Blood Pressure 105/54 (mmHg): Body Mass Index(BMI): Temperature(F): 97.5 Respiratory Rate 20 (breaths/min): Photos: [1:No Photos] [N/A:N/A] Wound Location: [1:Left Metatarsal head fourth] [N/A:N/A] Wounding Event: [1:Surgical Injury] [N/A:N/A] Primary Etiology: [1:Open Surgical Wound] [N/A:N/A] Comorbid History: [1:Cataracts, Arrhythmia, Congestive Heart Failure, Hypertension, Type II Diabetes, Rheumatoid Arthritis, Neuropathy] [N/A:N/A] Date Acquired: [1:02/26/2016] [N/A:N/A] Weeks of Treatment: [1:4] [N/A:N/A] Wound Status: [1:Open] [N/A:N/A] Measurements L x W x D 2.5x0.6x0.7 [N/A:N/A] (cm) Area (cm) : [1:1.178] [N/A:N/A] Volume (cm) : [1:0.825] [N/A:N/A] % Reduction in Area: [1:-78.50%] [N/A:N/A] % Reduction in Volume: -1150.00% [N/A:N/A] Classification: [1:Partial Thickness] [N/A:N/A] HBO Classification: [1:Grade 1] [N/A:N/A] Exudate Amount: [1:Large] [N/A:N/A] Exudate Type: [1:Serous] [N/A:N/A] Exudate Color: [1:amber] [N/A:N/A] Wound  Margin: [1:Flat and Intact] [N/A:N/A] Granulation Amount: [1:None Present (0%)] [N/A:N/A] Necrotic Amount: [1:Large (67-100%)] [N/A:N/A] Exposed Structures: [1:Fascia: No Fat: No Tendon: No] [N/A:N/A] Muscle: No Joint: No Bone: No Limited to Skin Breakdown Epithelialization: None N/A N/A Periwound Skin Texture: Edema: Yes N/A N/A Periwound Skin Moist: Yes N/A N/A Moisture: Periwound Skin Color: No Abnormalities Noted N/A N/A Temperature: No Abnormality N/A N/A Tenderness on Yes N/A N/A Palpation: Wound Preparation: Ulcer Cleansing: N/A N/A Rinsed/Irrigated with Saline Topical Anesthetic Applied: Other: lidocaine 4% Treatment Notes Electronic Signature(s) Signed: 04/28/2016 5:52:45 PM By: Alric Quan Entered By: Alric Quan on 04/28/2016 [name] [name] ([id number]) -------------------------------------------------------------------------------- [name] [name] Patient Name: [name],  [name] L. Date of Service: 04/28/2016 3:45 PM Medical Record Number: [id number] Patient Account Number: [id number] Date of Birth/Sex: [birth year] (65 y.o. Male) Treating RN: Ahmed Prima Primary Care Physician: [name] [name] Other Clinician: Referring Physician: Starlyn [name] Treating Physician/Extender: Frann Rider in Treatment: 4 Active Inactive Abuse / Safety / Falls / Self Care Management Nursing Diagnoses: Potential for falls Goals: Patient will remain injury free Date Initiated: 03/28/2016 Goal Status: Active Interventions: Assess fall risk on admission and as needed Notes: Orientation to the Wound Care Program Nursing Diagnoses: Knowledge deficit related to the wound healing center program Goals: Patient/caregiver will verbalize understanding of the [facility] Date Initiated: 03/28/2016 Goal Status: Active Interventions: Provide education on orientation to the wound center Notes: Pain, Acute or Chronic Nursing Diagnoses: Pain, acute or chronic: actual or potential Potential alteration in comfort, pain Goals: [name] [name] L. ([id number]) Patient will verbalize adequate pain control and receive pain control interventions during procedures as needed Date Initiated: 03/28/2016 Goal Status: Active Interventions: Assess comfort goal upon admission Complete pain assessment as per visit requirements Notes: Wound/Skin Impairment Nursing Diagnoses: Impaired tissue integrity Goals: Ulcer/skin breakdown will have a volume reduction of 30% by week 4 Date Initiated: 03/28/2016 Goal Status: Active Ulcer/skin breakdown will have a volume reduction of 50% by week 8 Date Initiated: 03/28/2016 Goal Status: Active Ulcer/skin breakdown will have a volume reduction of 80% by week 12 Date Initiated: 03/28/2016 Goal Status: Active Interventions: Assess patient/caregiver ability to obtain necessary supplies Assess ulceration(s) every visit Notes: Electronic  Signature(s) Signed: 04/28/2016 5:52:45 PM By: Alric Quan Entered By: Alric Quan on 04/28/2016 16:15:04 [name] [name] Carlean Jews ([id number]) -------------------------------------------------------------------------------- Pain Assessment [name] Patient Name: [name] [name] L. Date of Service: 04/28/2016 3:45 PM Medical Record Number: [id number] Patient Account Number: [id number] Date of Birth/Sex: February 25, 1951 (65 y.o. Male) Treating RN: Ahmed Prima Primary Care Physician: [name] [name] Other Clinician: Referring Physician: Starlyn [name] Treating Physician/Extender: Frann Rider in Treatment: 4 Active Problems Location of Pain Severity and Description of Pain Patient Has Paino No Site Locations Pain Management and Medication Current Pain Management: Electronic Signature(s) Signed: 04/28/2016 5:52:45 PM By: Alric Quan Entered By: Alric Quan on 04/28/2016 16:04:13 Starlin, Mickel [name] ([id number]) -------------------------------------------------------------------------------- Patient/Caregiver Education [name] Patient Name: [name] [name] L. Date of Service: 04/28/2016 3:45 PM Medical Record Number: [id number] Patient Account Number: [id number] Date of Birth/Gender: 1950-12-27 (65 y.o. Male) Treating RN: Ahmed Prima Primary Care Physician: [name] [name] Other Clinician: Referring Physician: Starlyn [name] Treating Physician/Extender: Frann Rider in Treatment: 4 Education Assessment Education Provided To: Patient Education Topics Provided Wound/Skin Impairment: Handouts: Other: change dressing as ordered Methods: Demonstration, Explain/Verbal Responses: State content correctly Electronic Signature(s) Signed: 04/28/2016 5:52:45 PM By: Alric Quan Entered By: Alric Quan on 04/28/2016 16:36:25 Creasman, Mickel [name] ([id number]) -------------------------------------------------------------------------------- Wound Assessment [name] Patient Name: [name],  [name]  L. Date of Service: 04/28/2016 3:45 PM Medical Record Number: [id number] Patient Account Number: [id number] Date of Birth/Sex: 12-17-1951 (65 y.o. Male) Treating RN: Carolyne Fiscal, Debi Primary Care Physician: [name] [name] Other Clinician: Referring Physician: Starlyn [name] Treating Physician/Extender: Frann Rider in Treatment: 4 Wound Status Wound Number: 1 Primary Open Surgical Wound Etiology: Wound Location: Left Metatarsal head fourth Wound Open Wounding Event: Surgical Injury Status: Date Acquired: 02/26/2016 Comorbid Cataracts, Arrhythmia, Congestive Weeks Of Treatment: 4 History: Heart Failure, Hypertension, Type II Clustered Wound: No Diabetes, Rheumatoid Arthritis, Neuropathy Photos Photo Uploaded By: Alric Quan on 04/28/2016 17:24:38 Wound Measurements Length: (cm) 2.5 Width: (cm) 0.6 Depth: (cm) 0.7 Area: (cm) 1.178 Volume: (cm) 0.825 % Reduction in Area: -78.5% % Reduction in Volume: -1150% Epithelialization: None Tunneling: No Undermining: No Wound Description Classification: Partial Thickness Foul Odor A Diabetic Severity (Wagner): Grade 1 Wound Margin: Flat and Intact Exudate Amount: Large Exudate Type: Serous Exudate Color: amber fter Cleansing: No Wound Bed Granulation Amount: None Present (0%) Exposed Structure Necrotic Amount: Large (67-100%) Fascia Exposed: No [name] [name] L. ([id number]) Necrotic Quality: Adherent Slough Fat Layer Exposed: No Tendon Exposed: No Muscle Exposed: No Joint Exposed: No Bone Exposed: No Limited to Skin Breakdown Periwound Skin Texture Texture Color No Abnormalities Noted: No No Abnormalities Noted: No Localized Edema: Yes Temperature / Pain Moisture Temperature: No Abnormality No Abnormalities Noted: No Tenderness on Palpation: Yes Moist: Yes Wound Preparation Ulcer Cleansing: Rinsed/Irrigated with Saline Topical Anesthetic Applied: Other: lidocaine 4%, Treatment Notes Wound #1 (Left  Metatarsal head fourth) 1. Cleansed with: Clean wound with Normal Saline 2. Anesthetic Topical Lidocaine 4% cream to wound bed prior to debridement 4. Dressing Applied: Hydrogel Other dressing (specify in notes) 5. Secondary Dressing Applied Dry Gauze Foam Notes conform, foam and betadine to the deep tissues injuries Electronic Signature(s) Signed: 04/28/2016 5:52:45 PM By: Alric Quan Entered By: Alric Quan on 04/28/2016 16:14:54 Brenneman, Mickel [name] ([id number]) -------------------------------------------------------------------------------- Vitals [name] Patient Name: [name] [name] L. Date of Service: 04/28/2016 3:45 PM Medical Record Number: [id number] Patient Account Number: [id number] Date of Birth/Sex: Dec 02, 1951 (65 y.o. Male) Treating RN: Carolyne Fiscal, Debi Primary Care Physician: [name] [name] Other Clinician: Referring Physician: Starlyn [name] Treating Physician/Extender: Frann Rider in Treatment: 4 Vital Signs Time Taken: 16:04 Temperature (F): 97.5 Height (in): 69 Pulse (bpm): 77 Respiratory Rate (breaths/min): 20 Blood Pressure (mmHg): 105/54 Reference Range: 80 - 120 mg / dl Electronic Signature(s) Signed: 04/28/2016 5:52:45 PM By: Alric Quan Entered By: Alric Quan on 04/28/2016 16:06:05

## 2016-05-03 DIAGNOSIS — I2510 Atherosclerotic heart disease of native coronary artery without angina pectoris: Secondary | ICD-10-CM | POA: Diagnosis not present

## 2016-05-03 DIAGNOSIS — M0579 Rheumatoid arthritis with rheumatoid factor of multiple sites without organ or systems involvement: Secondary | ICD-10-CM | POA: Diagnosis not present

## 2016-05-03 DIAGNOSIS — Z89429 Acquired absence of other toe(s), unspecified side: Secondary | ICD-10-CM | POA: Diagnosis not present

## 2016-05-03 DIAGNOSIS — I252 Old myocardial infarction: Secondary | ICD-10-CM | POA: Diagnosis not present

## 2016-05-03 DIAGNOSIS — N183 Chronic kidney disease, stage 3 (moderate): Secondary | ICD-10-CM | POA: Diagnosis not present

## 2016-05-03 DIAGNOSIS — Z96642 Presence of left artificial hip joint: Secondary | ICD-10-CM | POA: Diagnosis not present

## 2016-05-03 DIAGNOSIS — Z8249 Family history of ischemic heart disease and other diseases of the circulatory system: Secondary | ICD-10-CM | POA: Diagnosis not present

## 2016-05-03 DIAGNOSIS — Z951 Presence of aortocoronary bypass graft: Secondary | ICD-10-CM | POA: Diagnosis not present

## 2016-05-03 DIAGNOSIS — Z833 Family history of diabetes mellitus: Secondary | ICD-10-CM | POA: Diagnosis not present

## 2016-05-03 DIAGNOSIS — I129 Hypertensive chronic kidney disease with stage 1 through stage 4 chronic kidney disease, or unspecified chronic kidney disease: Secondary | ICD-10-CM | POA: Diagnosis not present

## 2016-05-03 DIAGNOSIS — E11319 Type 2 diabetes mellitus with unspecified diabetic retinopathy without macular edema: Secondary | ICD-10-CM | POA: Diagnosis not present

## 2016-05-03 DIAGNOSIS — I255 Ischemic cardiomyopathy: Secondary | ICD-10-CM | POA: Diagnosis not present

## 2016-05-03 DIAGNOSIS — E7800 Pure hypercholesterolemia, unspecified: Secondary | ICD-10-CM | POA: Diagnosis not present

## 2016-05-03 DIAGNOSIS — E1140 Type 2 diabetes mellitus with diabetic neuropathy, unspecified: Secondary | ICD-10-CM | POA: Diagnosis not present

## 2016-05-03 DIAGNOSIS — G629 Polyneuropathy, unspecified: Secondary | ICD-10-CM | POA: Diagnosis not present

## 2016-05-05 ENCOUNTER — Encounter: Payer: Medicare Other | Admitting: Surgery

## 2016-05-05 DIAGNOSIS — I70245 Atherosclerosis of native arteries of left leg with ulceration of other part of foot: Secondary | ICD-10-CM | POA: Diagnosis not present

## 2016-05-05 DIAGNOSIS — E11621 Type 2 diabetes mellitus with foot ulcer: Secondary | ICD-10-CM | POA: Diagnosis not present

## 2016-05-05 DIAGNOSIS — I739 Peripheral vascular disease, unspecified: Secondary | ICD-10-CM | POA: Diagnosis not present

## 2016-05-05 DIAGNOSIS — L97522 Non-pressure chronic ulcer of other part of left foot with fat layer exposed: Secondary | ICD-10-CM | POA: Diagnosis not present

## 2016-05-05 DIAGNOSIS — L97521 Non-pressure chronic ulcer of other part of left foot limited to breakdown of skin: Secondary | ICD-10-CM | POA: Diagnosis not present

## 2016-05-05 DIAGNOSIS — M069 Rheumatoid arthritis, unspecified: Secondary | ICD-10-CM | POA: Diagnosis not present

## 2016-05-05 DIAGNOSIS — E1122 Type 2 diabetes mellitus with diabetic chronic kidney disease: Secondary | ICD-10-CM | POA: Diagnosis not present

## 2016-05-06 NOTE — Progress Notes (Signed)
ARTEMAS, LETTMAN ([id number]) Visit Report for 05/05/2016 Chief Complaint Document Details Patient Name: [name], [name] Date of Service: 05/05/2016 3:00 PM Medical Record Number: [id number] Patient Account Number: [id number] Date of Birth/Sex: 03/15/1951 (65 y.o. Male) Treating RN: [name] Primary Care Physician: [name] Other Clinician: Referring Physician: Starlyn [name] Treating Physician/Extender: [name] in Treatment: 5 Information Obtained from: Patient Chief Complaint Patients presents for treatment of an open diabetic ulcer to the left foot with a nonhealing surgical wound for about a month and a half. Electronic Signature([name]) Signed: 05/05/2016 3:26:41 PM By: [name] Fudge MD, FACS Entered By: [name] on 05/05/2016 15:26:41 [name], [name] ([id number]) -------------------------------------------------------------------------------- HPI Details Patient Name: [name], [name] Date of Service: 05/05/2016 3:00 PM Medical Record Number: [id number] Patient Account Number: [id number] Date of Birth/Sex: 1951/07/07 (65 y.o. Male) Treating RN: [name] Primary Care Physician: [name] Other Clinician: Referring Physician: Starlyn [name] Treating Physician/Extender: [name] in Treatment: 5 History of Present Illness Location: wound in the region of the previous surgery where he'[name] had a ray amputation of his left fourth toe Quality: Patient reports experiencing a dull pain to affected area([name]). Severity: Patient states wound are getting worse. Duration: Patient has had the wound for > 2 months prior to seeking treatment at the wound center Timing: Pain in wound is Intermittent (comes and goes Context: The wound occurred when the patient was found to have an osteomyelitis of the left fourth toe and had surgery performed at [facility] Modifying Factors: Consults to this date include:angiography and podiatry at [place] Signs and Symptoms:  Patient reports having increase discharge. HPI Description: This 65 year old gentleman was referred to as by Dr. Buddy [name] from [facility], and has been treated there for a diabetic ulcer of the left foot associated with peripheral arterial disease and osteomyelitis due to a history of Wagner grade 3 infection of the left foot. Patient was on oral doxycycline and debridement was done at the location of the left toe surgical end dictation side. He has been awaiting authorization of hyperbaric oxygen therapy and Regranex. Since he lives closer to our wound center he was referred to [place] for the possibility of hyperbaric oxygen therapy. [name] problem list includes diabetes mellitus type 2, essential hypertension, hammertoe, rheumatoid arthritis, cellulitis and abscess of the toe, current data disease, chronic kidney disease stage III, systolic heart failure, pancytopenia. 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 Status post left fourth toe ray amputation without evidence of osteomyelitis. The patient also had an arteriogram of the left lower extremity done on 02/09/2016 for previous arterial duplex showing no flow in the PTA and dampened flow in the ATA. The right great toe pressure was 40 mmHg and the left great toe pressure was 38 mmHg. Final impression was runoff disease with single- vessel runoff to the ankle not available to endovascular revascularization. MRI of the foot done on 11/30/2016 showed left fourth toe findings consistent with osteomyelitis involving the distal aspect of the fourth proximal phalanx with osteitis of the fused fourth  middle and distal phalanx. There was also diffuse soft tissue swelling of the forefoot without discrete abscess. 04/14/2016 --since I saw him 10 days ago things  have changed a little bit where he has dry gangrene on the second toe and on the third toe. He is also been to [facility] and they have got him Regranex, which he is applying once a day. We tried to get in touch with the patient to clarify that [facility] had not got any authorization for hyperbaric oxygen therapy but the patient was unavailable. We will discuss his insurance coverage today. chest x-ray done on 04/04/2016 was within normal limits. 04/21/2016 -- the patient has not been able to get hyperbaric oxygen therapy yet as his copayment is quite large and he is responsible for 20%. He is awaiting financial aid. [name] ([id number]) Electronic Signature([name]) Signed: 05/05/2016 3:26:48 PM By: [name] Fudge MD, FACS Entered By: [name] on 05/05/2016 15:26:48 [name], [name] ([id number]) -------------------------------------------------------------------------------- Physical Exam Details Patient Name: [name], [name] Date of Service: 05/05/2016 3:00 PM Medical Record Number: [id number] Patient Account Number: [id number] Date of Birth/Sex: Jun 10, 1951 (65 y.o. Male) Treating RN: [name] Primary Care Physician: [name] Other Clinician: Referring Physician: Starlyn [name] Treating Physician/Extender: [name] in Treatment: 5 Constitutional . Pulse regular. Respirations normal and unlabored. Afebrile. . Eyes Nonicteric. Reactive to light. Ears, Nose, Mouth, and Throat Lips, teeth, and gums WNL.[name] Moist mucosa without lesions. Neck supple and nontender. No palpable supraclavicular or cervical adenopathy. Normal sized without goiter. Respiratory WNL. No retractions.. Breath sounds WNL, No rubs, rales, rhonchi, or wheeze.. Cardiovascular Heart rhythm and rate regular, no murmur or gallop.. Pedal Pulses  WNL. No clubbing, cyanosis or edema. Lymphatic No adneopathy. No adenopathy. No adenopathy. Musculoskeletal Adexa without tenderness or enlargement.. Digits and nails w/o clubbing, cyanosis, infection, petechiae, ischemia, or inflammatory conditions.. Integumentary (Hair, Skin) No suspicious lesions. No crepitus or fluctuance. No peri-wound warmth or erythema. No masses.[name] Psychiatric Judgement and insight Intact.. No evidence of depression, anxiety, or agitation.. Notes the wound at the base of the fourth metatarsal head continues to have 2 small openings and no sharp debridement was done today. The patches of dry gangrene persist on the second and third toe. Electronic Signature([name]) Signed: 05/05/2016 3:27:22 PM By: [name] Fudge MD, FACS Entered By: [name] on 05/05/2016 15:27:22 [name], [name] ([id number]) -------------------------------------------------------------------------------- Physician Orders Details Patient Name: [name], [name] Date of Service: 05/05/2016 3:00 PM Medical Record Number: [id number] Patient Account Number: [id number] Date of Birth/Sex: 02-03-1951 (65 y.o. Male) Treating RN: [name] Primary Care Physician: [name] Other Clinician: Referring Physician: Starlyn [name] Treating Physician/Extender: [name] in Treatment: 5 Verbal / Phone Orders: Yes Clinician: Cornell [name] Read Back and Verified: Yes Diagnosis Coding Wound Cleansing Wound #1 Left Metatarsal head fourth o Clean wound with Normal Saline. Anesthetic Wound #1 Left Metatarsal head fourth o Topical Lidocaine 4% cream applied to wound bed prior to debridement Skin Barriers/Peri-Wound Care Wound #1 Left Metatarsal head fourth o Skin Prep Primary Wound Dressing Wound #1 Left Metatarsal head fourth o Hydrogel o Foam - ******only to the deep tissue injuries****** o Other: - *******Betadine paint to deep tissue injuries********** Secondary Dressing Wound #1 Left  Metatarsal head fourth o Dry Gauze - tape, conform Dressing Change Frequency Wound #1 Left Metatarsal head fourth o Change dressing every day. Follow-up Appointments Wound #1 Left Metatarsal head fourth o Return Appointment in 1 week. Edema Control Wound #1 Left Metatarsal head fourth o Elevate legs to the level of the heart and pump ankles as often as possible [name], [name] ([id number]) Electronic Signature([name]) Signed: 05/05/2016 4:06:04 PM By:  [name] Fudge MD, FACS Signed: 05/05/2016 5:50:20 PM By: Gretta Cool RN, BSN, Kim RN, BSN Entered By: Gretta Cool, RN, BSN, Kim on 05/05/2016 15:18:35 [name], [name] ([id number]) -------------------------------------------------------------------------------- Problem List Details Patient Name: [name], [name] Date of Service: 05/05/2016 3:00 PM Medical Record Number: [id number] Patient Account Number: [id number] Date of Birth/Sex: 1951/06/29 (66 y.o. Male) Treating RN: [name] Primary Care Physician: [name] Other Clinician: Referring Physician: Starlyn [name] Treating Physician/Extender: [name] in Treatment: 5 Active Problems ICD-10 Encounter Code Description Active Date Diagnosis E11.621 Type 2 diabetes mellitus with foot ulcer 03/28/2016 Yes L97.522 Non-pressure chronic ulcer of other part of left foot with fat 03/28/2016 Yes layer exposed I73.9 Peripheral vascular disease, unspecified 03/28/2016 Yes I70.245 Atherosclerosis of native arteries of left leg with ulceration 03/28/2016 Yes of other part of foot Inactive Problems Resolved Problems Electronic Signature([name]) Signed: 05/05/2016 3:26:33 PM By: [name] Fudge MD, FACS Entered By: [name] on 05/05/2016 15:26:33 Girtman, [name] ([id number]) -------------------------------------------------------------------------------- Progress Note Details Patient Name: [name], [name] Date of Service: 05/05/2016 3:00 PM Medical Record Number: [id number] Patient Account Number:  [id number] Date of Birth/Sex: Sep 18, 1951 (65 y.o. Male) Treating RN: [name] Primary Care Physician: [name] Other Clinician: Referring Physician: Starlyn [name] Treating Physician/Extender: [name] in Treatment: 5 Subjective Chief Complaint Information obtained from Patient Patients presents for treatment of an open diabetic ulcer to the left foot with a nonhealing surgical wound for about a month and a half. History of Present Illness (HPI) The following HPI elements were documented for the patient'[name] wound: Location: wound in the region of the previous surgery where he'[name] had a ray amputation of his left fourth toe Quality: Patient reports experiencing a dull pain to affected area([name]). Severity: Patient states wound are getting worse. Duration: Patient has had the wound for > 2 months prior to seeking treatment at the wound center Timing: Pain in wound is Intermittent (comes and goes Context: The wound occurred when the patient was found to have an osteomyelitis of the left fourth toe and had surgery performed at [facility] Modifying Factors: Consults to this date include:angiography and podiatry at [place] Signs and Symptoms: Patient reports having increase discharge. This 65 year old gentleman was referred to as by Dr. Buddy [name] from [facility], and has been treated there for a diabetic ulcer of the left foot associated with peripheral arterial disease and osteomyelitis due to a history of Wagner grade 3 infection of the left foot. Patient was on oral doxycycline and debridement was done at the location of the left toe surgical end dictation side. He has been awaiting authorization of hyperbaric oxygen therapy and Regranex. Since he lives closer to our wound center he was referred to [place] for the possibility of hyperbaric oxygen therapy. Patient [name] problem list includes diabetes mellitus type 2, essential hypertension, hammertoe,  rheumatoid arthritis, cellulitis and abscess of the toe, current data disease, chronic kidney disease stage III, systolic heart failure, pancytopenia. 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 Status post left fourth toe ray amputation without evidence of osteomyelitis. The patient also had an arteriogram of the left lower extremity done on 02/09/2016 for previous arterial duplex showing no flow in the PTA and  dampened flow in the ATA. The right great toe pressure was 40 mmHg and the left great toe pressure was 38 mmHg. Final impression was runoff disease with single- vessel runoff to the ankle not available to endovascular revascularization. MRI of the foot done on 11/30/2016 showed left fourth toe findings consistent with osteomyelitis involving the distal aspect of the fourth proximal phalanx with osteitis of the fused fourth middle and distal phalanx. There was also diffuse soft tissue swelling of the forefoot without discrete abscess. [name], [name] ([id number]) 04/14/2016 --since I saw him 10 days ago things have changed a little bit where he has dry gangrene on the second toe and on the third toe. He is also been to [facility] and they have got him Regranex, which he is applying once a day. We tried to get in touch with the patient to clarify that [facility] had not got any authorization for hyperbaric oxygen therapy but the patient was unavailable. We will discuss his insurance coverage today. chest x-ray done on 04/04/2016 was within normal limits. 04/21/2016 -- the patient has not been able to get hyperbaric oxygen therapy yet as his copayment is  quite large and he is responsible for 20%. He is awaiting financial aid. Objective Constitutional Pulse regular. Respirations normal and unlabored. Afebrile. Vitals Time Taken: 2:55 PM, Height: 69 in, Temperature: 98.0 F, Pulse: 75 bpm, Respiratory Rate: 18 breaths/min, Blood Pressure: 74/62 mmHg. Eyes Nonicteric. Reactive to light. Ears, Nose, Mouth, and Throat Lips, teeth, and gums WNL.[name] Moist mucosa without lesions. Neck supple and nontender. No palpable supraclavicular or cervical adenopathy. Normal sized without goiter. Respiratory WNL. No retractions.. Breath sounds WNL, No rubs, rales, rhonchi, or wheeze.. Cardiovascular Heart rhythm and rate regular, no murmur or gallop.. Pedal Pulses WNL. No clubbing, cyanosis or edema. Lymphatic No adneopathy. No adenopathy. No adenopathy. Musculoskeletal Adexa without tenderness or enlargement.. Digits and nails w/o clubbing, cyanosis, infection, petechiae, ischemia, or inflammatory conditions.[name] Psychiatric Judgement and insight Intact.. No evidence of depression, anxiety, or agitation.. General Notes: the wound at the base of the fourth metatarsal head continues to have 2 small openings [name], [name] ([id number]) and no sharp debridement was done today. The patches of dry gangrene persist on the second and third toe. Integumentary (Hair, Skin) No suspicious lesions. No crepitus or fluctuance. No peri-wound warmth or erythema. No masses.. Wound #1 status is Open. Original cause of wound was Surgical Injury. The wound is located on the Left Metatarsal head fourth. The wound measures 1.4cm length x 0.4cm width x 0.5cm depth; 0.44cm^2 area and 0.22cm^3 volume. The wound is limited to skin breakdown. There is a medium amount of serous drainage noted. The wound margin is flat and intact. There is no granulation within the wound bed. There is a large (67-100%) amount of necrotic tissue within the wound bed including Adherent Slough.  The periwound skin appearance exhibited: Localized Edema, Moist. Periwound temperature was noted as No Abnormality. The periwound has tenderness on palpation. Assessment Active Problems ICD-10 E11.621 - Type 2 diabetes mellitus with foot ulcer L97.522 - Non-pressure chronic ulcer of other part of left foot with fat layer exposed I73.9 - Peripheral vascular disease, unspecified I70.245 - Atherosclerosis of native arteries of left leg with ulceration of other part of foot Plan Wound Cleansing: Wound #1 Left Metatarsal head fourth: Clean wound with Normal Saline. Anesthetic: Wound #1 Left Metatarsal head fourth: Topical Lidocaine 4% cream applied to wound bed prior to debridement Skin Barriers/Peri-Wound Care: Wound #1 Left Metatarsal  head fourth: Skin Prep Primary Wound Dressing: Wound #1 Left Metatarsal head fourth: Hydrogel Foam - ******only to the deep tissue injuries****** Other: - *******Betadine paint to deep tissue injuries********** Secondary Dressing: Wound #1 Left Metatarsal head fourth: [name], [name] ([id number]) Dry Gauze - tape, conform Dressing Change Frequency: Wound #1 Left Metatarsal head fourth: Change dressing every day. Follow-up Appointments: Wound #1 Left Metatarsal head fourth: Return Appointment in 1 week. Edema Control: Wound #1 Left Metatarsal head fourth: Elevate legs to the level of the heart and pump ankles as often as possible I have discussed with the patient and his caregiver that he can follow-up at [facility] with Dr. Raliegh Ip and continue to use the Regranex they have advised. If at any time he gets his hyperbaric oxygen therapy authorized, we would be happy to start him on this. Electronic Signature([name]) Signed: 05/05/2016 3:28:58 PM By: [name] Fudge MD, FACS Entered By: [name] on 05/05/2016 15:28:58 [place], [name] ([id number]) -------------------------------------------------------------------------------- SuperBill Details Patient  Name: Durfee, Geovany L. Date of Service: 05/05/2016 Medical Record Number: [id number] Patient Account Number: [id number] Date of Birth/Sex: 12-01-1951 (65 y.o. 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 of other part of foot Facility Procedures CPT4 Code: [id number] Description: [phone] - WOUND CARE VISIT-LEV 2 EST PT Modifier: Quantity: 1 Physician Procedures CPT4: Description Modifier Quantity Code [id number] - WC PHYS LEVEL 3 - EST PT 1 ICD-10 Description Diagnosis E11.621 Type 2 diabetes mellitus with foot ulcer L97.522 Non-pressure chronic ulcer of other part of left foot with fat layer exposed I70.245  Atherosclerosis of native arteries of left leg with ulceration of other part of foot Electronic Signature([name]) Signed: 05/05/2016 3:29:19 PM By: [name] Fudge MD, FACS Entered By: [name] on 05/05/2016 15:29:19

## 2016-05-06 NOTE — Progress Notes (Signed)
SYNCERE, KATZENSTEIN ([id number]) Visit Report for 05/05/2016 Arrival Information Details Patient Name: [name], [name]. Date of Service: 05/05/2016 3:00 PM Medical Record Number: [id number] Patient Account Number: [id number] Date of Birth/Sex: [birth year] (65 y.o. Male) Treating RN: Cornell Barman Primary Care Physician: Starlyn Skeans Other Clinician: Referring Physician: Starlyn Skeans Treating Physician/Extender: Frann Rider in Treatment: 5 Visit Information History Since Last Visit Added or deleted any medications: No Patient Arrived: Wheel Chair Any new allergies or adverse reactions: No Arrival Time: 14:52 Had a fall or experienced change in No activities of daily living that may affect Accompanied By: wife risk of falls: Transfer Assistance: Manual Signs or symptoms of abuse/neglect since last No Patient Identification Verified: Yes visito Secondary Verification Process Yes Hospitalized since last visit: No Completed: Has Dressing in Place as Prescribed: Yes Patient Requires Transmission-Based No Pain Present Now: No Precautions: Patient Has Alerts: Yes Patient Alerts: DM II Electronic Signature(s) Signed: 05/05/2016 5:50:20 PM By: Gretta Cool, RN, BSN, Kim RN, BSN Entered By: Gretta Cool, RN, BSN, Kim on 05/05/2016 14:53:42 Henkin, [name] ([id number]) -------------------------------------------------------------------------------- Clinic Level of Care Assessment Details Patient Name: [name], [name] Date of Service: 05/05/2016 3:00 PM Medical Record Number: [id number] Patient Account Number: [id number] Date of Birth/Sex: 1951/09/28 (65 y.o. Male) Treating RN: Cornell Barman Primary Care Physician: Starlyn Skeans Other Clinician: Referring Physician: Starlyn Skeans Treating Physician/Extender: Frann Rider in Treatment: 5 Clinic Level of Care Assessment Items TOOL 4 Quantity Score []  - Use when only an EandM is performed on FOLLOW-UP visit 0 ASSESSMENTS - Nursing Assessment /  Reassessment []  - Reassessment of Co-morbidities (includes updates in patient status) 0 []  - Reassessment of Adherence to Treatment Plan 0 ASSESSMENTS - Wound and Skin Assessment / Reassessment X - Simple Wound Assessment / Reassessment - one wound 1 5 []  - Complex Wound Assessment / Reassessment - multiple wounds 0 []  - Dermatologic / Skin Assessment (not related to wound area) 0 ASSESSMENTS - Focused Assessment []  - Circumferential Edema Measurements - multi extremities 0 []  - Nutritional Assessment / Counseling / Intervention 0 []  - Lower Extremity Assessment (monofilament, tuning fork, pulses) 0 []  - Peripheral Arterial Disease Assessment (using hand held doppler) 0 ASSESSMENTS - Ostomy and/or Continence Assessment and Care []  - Incontinence Assessment and Management 0 []  - Ostomy Care Assessment and Management (repouching, etc.) 0 PROCESS - Coordination of Care X - Simple Patient / Family Education for ongoing care 1 15 []  - Complex (extensive) Patient / Family Education for ongoing care 0 X - Staff obtains Programmer, systems, Records, Test Results / Process Orders 1 10 []  - Staff telephones HHA, Nursing Homes / Clarify orders / etc 0 []  - Routine Transfer to another Facility (non-emergent condition) 0 [name], [facility]. ([id number]) []  - Routine Hospital Admission (non-emergent condition) 0 []  - New Admissions / Biomedical engineer / Ordering NPWT, Apligraf, etc. 0 []  - Emergency Hospital Admission (emergent condition) 0 X - Simple Discharge Coordination 1 10 []  - Complex (extensive) Discharge Coordination 0 PROCESS - Special Needs []  - Pediatric / Minor Patient Management 0 []  - Isolation Patient Management 0 []  - Hearing / Language / Visual special needs 0 []  - Assessment of Community assistance (transportation, D/C planning, etc.) 0 []  - Additional assistance / Altered mentation 0 []  - Support Surface(s) Assessment (bed, cushion, seat, etc.) 0 INTERVENTIONS - Wound Cleansing /  Measurement X - Simple Wound Cleansing - one wound 1 5 []  - Complex Wound Cleansing - multiple wounds 0 X - Wound Imaging (photographs -  any number of wounds) 1 5 []  - Wound Tracing (instead of photographs) 0 X - Simple Wound Measurement - one wound 1 5 []  - Complex Wound Measurement - multiple wounds 0 INTERVENTIONS - Wound Dressings X - Small Wound Dressing one or multiple wounds 1 10 []  - Medium Wound Dressing one or multiple wounds 0 []  - Large Wound Dressing one or multiple wounds 0 []  - Application of Medications - topical 0 []  - Application of Medications - injection 0 INTERVENTIONS - Miscellaneous []  - External ear exam 0 [place], [name] ([id number]) []  - Specimen Collection (cultures, biopsies, blood, body fluids, etc.) 0 []  - Specimen(s) / Culture(s) sent or taken to Lab for analysis 0 []  - Patient Transfer (multiple staff / Harrel Lemon Lift / Similar devices) 0 []  - Simple Staple / Suture removal (25 or less) 0 []  - Complex Staple / Suture removal (26 or more) 0 []  - Hypo / Hyperglycemic Management (close monitor of Blood Glucose) 0 []  - Ankle / Brachial Index (ABI) - do not check if billed separately 0 X - Vital Signs 1 5 Has the patient been seen at the hospital within the last three years: Yes Total Score: 70 Level Of Care: New/Established - Level 2 Electronic Signature(s) Signed: 05/05/2016 5:50:20 PM By: Gretta Cool, RN, BSN, Kim RN, BSN Entered By: Gretta Cool, RN, BSN, Kim on 05/05/2016 15:19:04 [name] ([id number]) -------------------------------------------------------------------------------- Encounter Discharge Information Details Patient Name: [name], [name] Date of Service: 05/05/2016 3:00 PM Medical Record Number: [id number] Patient Account Number: [id number] Date of Birth/Sex: 09-11-1951 (65 y.o. Male) Treating RN: Cornell Barman Primary Care Physician: Starlyn Skeans Other Clinician: Referring Physician: Starlyn Skeans Treating Physician/Extender: Frann Rider in  Treatment: 5 Encounter Discharge Information Items Discharge Pain Level: 0 Discharge Condition: Stable Ambulatory Status: Wheelchair Discharge Destination: Home Transportation: Private Auto Accompanied By: wife Schedule Follow-up Appointment: Yes Medication Reconciliation completed and provided to Patient/Care No Donnielle Addison: Provided on Clinical Summary of Care: 05/05/2016 Form Type Recipient Paper Patient JP Electronic Signature(s) Signed: 05/05/2016 3:33:49 PM By: Ruthine Dose Entered By: Ruthine Dose on 05/05/2016 15:33:49 Hostetler, [name] ([id number]) -------------------------------------------------------------------------------- Lower Extremity Assessment Details Patient Name: Barkdull, Hanad L. Date of Service: 05/05/2016 3:00 PM Medical Record Number: [id number] Patient Account Number: [id number] Date of Birth/Sex: 12-08-1951 (65 y.o. Male) Treating RN: Cornell Barman Primary Care Physician: Starlyn Skeans Other Clinician: Referring Physician: Starlyn Skeans Treating Physician/Extender: Frann Rider in Treatment: 5 Vascular Assessment Pulses: Posterior Tibial Dorsalis Pedis Palpable: [Left:Yes] [Right:Yes] Extremity colors, hair growth, and conditions: Extremity Color: [Left:Normal] [Right:Normal] Hair Growth on Extremity: [Left:Yes] [Right:Yes] Temperature of Extremity: [Left:Warm] [Right:Warm] Capillary Refill: [Left:> 3 seconds] [Right:> 3 seconds] Toe Nail Assessment Left: Right: Thick: No Discolored: No Deformed: No Improper Length and Hygiene: No Electronic Signature(s) Signed: 05/05/2016 5:50:20 PM By: Gretta Cool, RN, BSN, Kim RN, BSN Entered By: Gretta Cool, RN, BSN, Kim on 05/05/2016 15:04:51 Pietro, [name] ([id number]) -------------------------------------------------------------------------------- Multi Wound Chart Details Patient Name: Furches, [name] Date of Service: 05/05/2016 3:00 PM Medical Record Number: [id number] Patient Account Number: [id number] Date of  Birth/Sex: 02-14-1951 (65 y.o. Male) Treating RN: Cornell Barman Primary Care Physician: Starlyn Skeans Other Clinician: Referring Physician: Starlyn Skeans Treating Physician/Extender: Frann Rider in Treatment: 5 Vital Signs Height(in): 69 Pulse(bpm): 75 Weight(lbs): Blood Pressure 74/62 (mmHg): Body Mass Index(BMI): Temperature(F): 98.0 Respiratory Rate 18 (breaths/min): Photos: [1:No Photos] [N/A:N/A] Wound Location: [1:Left Metatarsal head fourth] [N/A:N/A] Wounding Event: [1:Surgical Injury] [N/A:N/A] Primary Etiology: [1:Open Surgical Wound] [N/A:N/A] Comorbid History: [1:Cataracts, Arrhythmia, Congestive  Heart Failure, Hypertension, Type II Diabetes, Rheumatoid Arthritis, Neuropathy] [N/A:N/A] Date Acquired: [1:02/26/2016] [N/A:N/A] Weeks of Treatment: [1:5] [N/A:N/A] Wound Status: [1:Open] [N/A:N/A] Measurements L x W x D 1.4x0.4x0.5 [N/A:N/A] (cm) Area (cm) : [1:0.44] [N/A:N/A] Volume (cm) : [1:0.22] [N/A:N/A] % Reduction in Area: [1:33.30%] [N/A:N/A] % Reduction in Volume: -233.30% [N/A:N/A] Classification: [1:Partial Thickness] [N/A:N/A] HBO Classification: [1:Grade 1] [N/A:N/A] Exudate Amount: [1:Medium] [N/A:N/A] Exudate Type: [1:Serous] [N/A:N/A] Exudate Color: [1:amber] [N/A:N/A] Wound Margin: [1:Flat and Intact] [N/A:N/A] Granulation Amount: [1:None Present (0%)] [N/A:N/A] Necrotic Amount: [1:Large (67-100%)] [N/A:N/A] Exposed Structures: [1:Fascia: No Fat: No Tendon: No] [N/A:N/A] Muscle: No Joint: No Bone: No Limited to Skin Breakdown Epithelialization: None N/A N/A Periwound Skin Texture: Edema: Yes N/A N/A Periwound Skin Moist: Yes N/A N/A Moisture: Periwound Skin Color: No Abnormalities Noted N/A N/A Temperature: No Abnormality N/A N/A Tenderness on Yes N/A N/A Palpation: Wound Preparation: Ulcer Cleansing: N/A N/A Rinsed/Irrigated with Saline Topical Anesthetic Applied: Other: lidocaine 4% Treatment Notes Electronic  Signature(s) Signed: 05/05/2016 5:50:20 PM By: Gretta Cool, RN, BSN, Kim RN, BSN Entered By: Gretta Cool, RN, BSN, Kim on 05/05/2016 15:09:49 Haseman, [name] ([id number]) -------------------------------------------------------------------------------- [place] Details Patient Name: Pittsley, [name] Date of Service: 05/05/2016 3:00 PM Medical Record Number: [id number] Patient Account Number: [id number] Date of Birth/Sex: 09-26-1951 (65 y.o. Male) Treating RN: Cornell Barman Primary Care Physician: Starlyn Skeans Other Clinician: Referring Physician: Starlyn Skeans Treating Physician/Extender: Frann Rider in Treatment: 5 Active Inactive Abuse / Safety / Falls / Self Care Management Nursing Diagnoses: Potential for falls Goals: Patient will remain injury free Date Initiated: 03/28/2016 Goal Status: Active Interventions: Assess fall risk on admission and as needed Notes: Orientation to the Wound Care Program Nursing Diagnoses: Knowledge deficit related to the wound healing center program Goals: Patient/caregiver will verbalize understanding of the [place] Program Date Initiated: 03/28/2016 Goal Status: Active Interventions: Provide education on orientation to the wound center Notes: Pain, Acute or Chronic Nursing Diagnoses: Pain, acute or chronic: actual or potential Potential alteration in comfort, pain Goals: Pelzel, [name] ([id number]) Patient will verbalize adequate pain control and receive pain control interventions during procedures as needed Date Initiated: 03/28/2016 Goal Status: Active Interventions: Assess comfort goal upon admission Complete pain assessment as per visit requirements Notes: Wound/Skin Impairment Nursing Diagnoses: Impaired tissue integrity Goals: Ulcer/skin breakdown will have a volume reduction of 30% by week 4 Date Initiated: 03/28/2016 Goal Status: Active Ulcer/skin breakdown will have a volume reduction of 50% by week 8 Date  Initiated: 03/28/2016 Goal Status: Active Ulcer/skin breakdown will have a volume reduction of 80% by week 12 Date Initiated: 03/28/2016 Goal Status: Active Interventions: Assess patient/caregiver ability to obtain necessary supplies Assess ulceration(s) every visit Notes: Electronic Signature(s) Signed: 05/05/2016 5:50:20 PM By: Gretta Cool, RN, BSN, Kim RN, BSN Entered By: Gretta Cool, RN, BSN, Kim on 05/05/2016 15:09:24 Grizzell, [name] ([id number]) -------------------------------------------------------------------------------- Pain Assessment Details Patient Name: Dutt, [name] Date of Service: 05/05/2016 3:00 PM Medical Record Number: [id number] Patient Account Number: [id number] Date of Birth/Sex: [birth year] (65 y.o. Male) Treating RN: Cornell Barman Primary Care Physician: Starlyn Skeans Other Clinician: Referring Physician: Starlyn Skeans Treating Physician/Extender: Frann Rider in Treatment: 5 Active Problems Location of Pain Severity and Description of Pain Patient Has Paino Yes Site Locations Pain Location: Pain in Ulcers Pain Management and Medication Current Pain Management: Electronic Signature(s) Signed: 05/05/2016 5:50:20 PM By: Gretta Cool, RN, BSN, Kim RN, BSN Entered By: Gretta Cool, RN, BSN, Kim on 05/05/2016 14:53:57 Tierney, [name] ([id number]) -------------------------------------------------------------------------------- Patient/Caregiver Education Details Patient  Name: Hogeland, [name] Date of Service: 05/05/2016 3:00 PM Medical Record Number: [id number] Patient Account Number: [id number] Date of Birth/Gender: 04-01-1951 (65 y.o. Male) Treating RN: Cornell Barman Primary Care Physician: Starlyn Skeans Other Clinician: Referring Physician: Starlyn Skeans Treating Physician/Extender: Frann Rider in Treatment: 5 Education Assessment Education Provided To: Patient Education Topics Provided Wound/Skin Impairment: Handouts: Caring for Your Ulcer, Other: continue wound care as  prescribed Methods: Demonstration, Explain/Verbal Responses: State content correctly Electronic Signature(s) Signed: 05/05/2016 5:50:20 PM By: Gretta Cool, RN, BSN, Kim RN, BSN Entered By: Gretta Cool, RN, BSN, Kim on 05/05/2016 15:13:41 Ficek, [name] ([id number]) -------------------------------------------------------------------------------- Wound Assessment Details Patient Name: Burtch, [name] Date of Service: 05/05/2016 3:00 PM Medical Record Number: [id number] Patient Account Number: [id number] Date of Birth/Sex: [birth year] (65 y.o. Male) Treating RN: Cornell Barman Primary Care Physician: Starlyn Skeans Other Clinician: Referring Physician: Starlyn Skeans Treating Physician/Extender: Frann Rider in Treatment: 5 Wound Status Wound Number: 1 Primary Open Surgical Wound Etiology: Wound Location: Left Metatarsal head fourth Wound Open Wounding Event: Surgical Injury Status: Date Acquired: 02/26/2016 Comorbid Cataracts, Arrhythmia, Congestive Weeks Of Treatment: 5 History: Heart Failure, Hypertension, Type II Clustered Wound: No Diabetes, Rheumatoid Arthritis, Neuropathy Photos Photo Uploaded By: Gretta Cool, RN, BSN, Kim on 05/05/2016 16:14:22 Wound Measurements Length: (cm) 1.4 Width: (cm) 0.4 Depth: (cm) 0.5 Area: (cm) 0.44 Volume: (cm) 0.22 % Reduction in Area: 33.3% % Reduction in Volume: -233.3% Epithelialization: None Wound Description Classification: Partial Thickness Foul Odor A Diabetic Severity (Wagner): Grade 1 Wound Margin: Flat and Intact Exudate Amount: Medium Exudate Type: Serous Exudate Color: amber fter Cleansing: No Wound Bed Granulation Amount: None Present (0%) Exposed Structure Necrotic Amount: Large (67-100%) Fascia Exposed: No Haecker, [name] ([id number]) Necrotic Quality: Adherent Slough Fat Layer Exposed: No Tendon Exposed: No Muscle Exposed: No Joint Exposed: No Bone Exposed: No Limited to Skin Breakdown Periwound Skin Texture Texture Color No  Abnormalities Noted: No No Abnormalities Noted: No Localized Edema: Yes Temperature / Pain Moisture Temperature: No Abnormality No Abnormalities Noted: No Tenderness on Palpation: Yes Moist: Yes Wound Preparation Ulcer Cleansing: Rinsed/Irrigated with Saline Topical Anesthetic Applied: Other: lidocaine 4%, Treatment Notes Wound #1 (Left Metatarsal head fourth) 2. Anesthetic Topical Lidocaine 4% cream to wound bed prior to debridement 4. Dressing Applied: Foam 5. Secondary Dressing Applied Dry Gauze Kerlix/Conform Notes conform, foam and betadine to the deep tissues injuries Electronic Signature(s) Signed: 05/05/2016 5:50:20 PM By: Gretta Cool, RN, BSN, Kim RN, BSN Entered By: Gretta Cool, RN, BSN, Kim on 05/05/2016 15:06:35 Berisha, [name] ([id number]) -------------------------------------------------------------------------------- [place] Details Patient Name: Mertens, [name] Date of Service: 05/05/2016 3:00 PM Medical Record Number: [id number] Patient Account Number: [id number] Date of Birth/Sex: 04/15/1951 (65 y.o. Male) Treating RN: Cornell Barman Primary Care Physician: Starlyn Skeans Other Clinician: Referring Physician: Starlyn Skeans Treating Physician/Extender: Frann Rider in Treatment: 5 Vital Signs Time Taken: 14:55 Temperature (F): 98.0 Height (in): 69 Pulse (bpm): 75 Respiratory Rate (breaths/min): 18 Blood Pressure (mmHg): 74/62 Reference Range: 80 - 120 mg / dl Electronic Signature(s) Signed: 05/05/2016 5:50:20 PM By: Gretta Cool, RN, BSN, Kim RN, BSN Entered By: Gretta Cool, RN, BSN, Kim on 05/05/2016 14:57:36

## 2016-05-11 DIAGNOSIS — I998 Other disorder of circulatory system: Secondary | ICD-10-CM | POA: Diagnosis not present

## 2016-05-12 ENCOUNTER — Ambulatory Visit: Payer: Medicare Other | Admitting: Surgery

## 2016-05-20 DIAGNOSIS — L97529 Non-pressure chronic ulcer of other part of left foot with unspecified severity: Secondary | ICD-10-CM | POA: Diagnosis not present

## 2016-05-20 DIAGNOSIS — T8754 Necrosis of amputation stump, left lower extremity: Secondary | ICD-10-CM | POA: Diagnosis not present

## 2016-05-20 DIAGNOSIS — E11621 Type 2 diabetes mellitus with foot ulcer: Secondary | ICD-10-CM | POA: Diagnosis not present

## 2016-05-20 DIAGNOSIS — E1152 Type 2 diabetes mellitus with diabetic peripheral angiopathy with gangrene: Secondary | ICD-10-CM | POA: Diagnosis not present

## 2016-05-28 DIAGNOSIS — Z452 Encounter for adjustment and management of vascular access device: Secondary | ICD-10-CM | POA: Diagnosis not present

## 2016-05-28 DIAGNOSIS — R509 Fever, unspecified: Secondary | ICD-10-CM | POA: Diagnosis not present

## 2016-05-28 DIAGNOSIS — J90 Pleural effusion, not elsewhere classified: Secondary | ICD-10-CM | POA: Diagnosis not present

## 2016-05-28 DIAGNOSIS — M86172 Other acute osteomyelitis, left ankle and foot: Secondary | ICD-10-CM | POA: Diagnosis not present

## 2016-05-28 DIAGNOSIS — J439 Emphysema, unspecified: Secondary | ICD-10-CM | POA: Diagnosis not present

## 2016-05-28 DIAGNOSIS — R0989 Other specified symptoms and signs involving the circulatory and respiratory systems: Secondary | ICD-10-CM | POA: Diagnosis not present

## 2016-05-28 DIAGNOSIS — A4901 Methicillin susceptible Staphylococcus aureus infection, unspecified site: Secondary | ICD-10-CM | POA: Diagnosis not present

## 2016-05-28 DIAGNOSIS — S93145A Subluxation of metatarsophalangeal joint of left lesser toe(s), initial encounter: Secondary | ICD-10-CM | POA: Diagnosis not present

## 2016-05-28 DIAGNOSIS — Z4682 Encounter for fitting and adjustment of non-vascular catheter: Secondary | ICD-10-CM | POA: Diagnosis not present

## 2016-05-28 DIAGNOSIS — K8010 Calculus of gallbladder with chronic cholecystitis without obstruction: Secondary | ICD-10-CM | POA: Diagnosis not present

## 2016-05-28 DIAGNOSIS — Z96641 Presence of right artificial hip joint: Secondary | ICD-10-CM | POA: Diagnosis not present

## 2016-05-28 DIAGNOSIS — R0602 Shortness of breath: Secondary | ICD-10-CM | POA: Diagnosis not present

## 2016-05-28 DIAGNOSIS — Z96642 Presence of left artificial hip joint: Secondary | ICD-10-CM | POA: Diagnosis not present

## 2016-05-28 DIAGNOSIS — Z4781 Encounter for orthopedic aftercare following surgical amputation: Secondary | ICD-10-CM | POA: Diagnosis not present

## 2016-05-28 DIAGNOSIS — M47812 Spondylosis without myelopathy or radiculopathy, cervical region: Secondary | ICD-10-CM | POA: Diagnosis not present

## 2016-05-28 DIAGNOSIS — J9692 Respiratory failure, unspecified with hypercapnia: Secondary | ICD-10-CM | POA: Diagnosis not present

## 2016-05-28 DIAGNOSIS — M24542 Contracture, left hand: Secondary | ICD-10-CM | POA: Diagnosis not present

## 2016-05-28 DIAGNOSIS — Z89422 Acquired absence of other left toe(s): Secondary | ICD-10-CM | POA: Diagnosis not present

## 2016-05-28 DIAGNOSIS — Z89429 Acquired absence of other toe(s), unspecified side: Secondary | ICD-10-CM | POA: Diagnosis not present

## 2016-05-28 DIAGNOSIS — D631 Anemia in chronic kidney disease: Secondary | ICD-10-CM | POA: Diagnosis not present

## 2016-05-28 DIAGNOSIS — M86072 Acute hematogenous osteomyelitis, left ankle and foot: Secondary | ICD-10-CM | POA: Diagnosis not present

## 2016-05-28 DIAGNOSIS — R748 Abnormal levels of other serum enzymes: Secondary | ICD-10-CM | POA: Diagnosis not present

## 2016-05-28 DIAGNOSIS — I519 Heart disease, unspecified: Secondary | ICD-10-CM | POA: Diagnosis not present

## 2016-05-28 DIAGNOSIS — N189 Chronic kidney disease, unspecified: Secondary | ICD-10-CM | POA: Diagnosis not present

## 2016-05-28 DIAGNOSIS — L03115 Cellulitis of right lower limb: Secondary | ICD-10-CM | POA: Diagnosis not present

## 2016-05-28 DIAGNOSIS — R0600 Dyspnea, unspecified: Secondary | ICD-10-CM | POA: Diagnosis not present

## 2016-05-28 DIAGNOSIS — M439 Deforming dorsopathy, unspecified: Secondary | ICD-10-CM | POA: Diagnosis not present

## 2016-05-28 DIAGNOSIS — I132 Hypertensive heart and chronic kidney disease with heart failure and with stage 5 chronic kidney disease, or end stage renal disease: Secondary | ICD-10-CM | POA: Diagnosis present

## 2016-05-28 DIAGNOSIS — R112 Nausea with vomiting, unspecified: Secondary | ICD-10-CM | POA: Diagnosis not present

## 2016-05-28 DIAGNOSIS — I361 Nonrheumatic tricuspid (valve) insufficiency: Secondary | ICD-10-CM | POA: Diagnosis not present

## 2016-05-28 DIAGNOSIS — S88112D Complete traumatic amputation at level between knee and ankle, left lower leg, subsequent encounter: Secondary | ICD-10-CM | POA: Diagnosis not present

## 2016-05-28 DIAGNOSIS — D539 Nutritional anemia, unspecified: Secondary | ICD-10-CM | POA: Diagnosis not present

## 2016-05-28 DIAGNOSIS — R633 Feeding difficulties: Secondary | ICD-10-CM | POA: Diagnosis not present

## 2016-05-28 DIAGNOSIS — L03119 Cellulitis of unspecified part of limb: Secondary | ICD-10-CM | POA: Diagnosis not present

## 2016-05-28 DIAGNOSIS — Z4901 Encounter for fitting and adjustment of extracorporeal dialysis catheter: Secondary | ICD-10-CM | POA: Diagnosis not present

## 2016-05-28 DIAGNOSIS — E11649 Type 2 diabetes mellitus with hypoglycemia without coma: Secondary | ICD-10-CM | POA: Diagnosis not present

## 2016-05-28 DIAGNOSIS — R5381 Other malaise: Secondary | ICD-10-CM | POA: Diagnosis not present

## 2016-05-28 DIAGNOSIS — G8918 Other acute postprocedural pain: Secondary | ICD-10-CM | POA: Diagnosis not present

## 2016-05-28 DIAGNOSIS — J984 Other disorders of lung: Secondary | ICD-10-CM | POA: Diagnosis not present

## 2016-05-28 DIAGNOSIS — Z9911 Dependence on respirator [ventilator] status: Secondary | ICD-10-CM | POA: Diagnosis not present

## 2016-05-28 DIAGNOSIS — J9811 Atelectasis: Secondary | ICD-10-CM | POA: Diagnosis not present

## 2016-05-28 DIAGNOSIS — I252 Old myocardial infarction: Secondary | ICD-10-CM | POA: Diagnosis not present

## 2016-05-28 DIAGNOSIS — M24541 Contracture, right hand: Secondary | ICD-10-CM | POA: Diagnosis not present

## 2016-05-28 DIAGNOSIS — M7989 Other specified soft tissue disorders: Secondary | ICD-10-CM | POA: Diagnosis not present

## 2016-05-28 DIAGNOSIS — R0902 Hypoxemia: Secondary | ICD-10-CM | POA: Diagnosis not present

## 2016-05-28 DIAGNOSIS — I2510 Atherosclerotic heart disease of native coronary artery without angina pectoris: Secondary | ICD-10-CM | POA: Diagnosis not present

## 2016-05-28 DIAGNOSIS — I5040 Unspecified combined systolic (congestive) and diastolic (congestive) heart failure: Secondary | ICD-10-CM | POA: Diagnosis present

## 2016-05-28 DIAGNOSIS — G92 Toxic encephalopathy: Secondary | ICD-10-CM | POA: Diagnosis not present

## 2016-05-28 DIAGNOSIS — G8929 Other chronic pain: Secondary | ICD-10-CM | POA: Diagnosis not present

## 2016-05-28 DIAGNOSIS — I2129 ST elevation (STEMI) myocardial infarction involving other sites: Secondary | ICD-10-CM | POA: Diagnosis not present

## 2016-05-28 DIAGNOSIS — I517 Cardiomegaly: Secondary | ICD-10-CM | POA: Diagnosis not present

## 2016-05-28 DIAGNOSIS — I5020 Unspecified systolic (congestive) heart failure: Secondary | ICD-10-CM | POA: Diagnosis not present

## 2016-05-28 DIAGNOSIS — M069 Rheumatoid arthritis, unspecified: Secondary | ICD-10-CM | POA: Diagnosis not present

## 2016-05-28 DIAGNOSIS — R1312 Dysphagia, oropharyngeal phase: Secondary | ICD-10-CM | POA: Diagnosis not present

## 2016-05-28 DIAGNOSIS — M0609 Rheumatoid arthritis without rheumatoid factor, multiple sites: Secondary | ICD-10-CM | POA: Diagnosis not present

## 2016-05-28 DIAGNOSIS — R739 Hyperglycemia, unspecified: Secondary | ICD-10-CM | POA: Diagnosis not present

## 2016-05-28 DIAGNOSIS — G729 Myopathy, unspecified: Secondary | ICD-10-CM | POA: Diagnosis not present

## 2016-05-28 DIAGNOSIS — R531 Weakness: Secondary | ICD-10-CM | POA: Diagnosis not present

## 2016-05-28 DIAGNOSIS — R601 Generalized edema: Secondary | ICD-10-CM | POA: Diagnosis not present

## 2016-05-28 DIAGNOSIS — G9341 Metabolic encephalopathy: Secondary | ICD-10-CM | POA: Diagnosis not present

## 2016-05-28 DIAGNOSIS — Z992 Dependence on renal dialysis: Secondary | ICD-10-CM | POA: Diagnosis not present

## 2016-05-28 DIAGNOSIS — R7881 Bacteremia: Secondary | ICD-10-CM | POA: Diagnosis not present

## 2016-05-28 DIAGNOSIS — I10 Essential (primary) hypertension: Secondary | ICD-10-CM | POA: Diagnosis not present

## 2016-05-28 DIAGNOSIS — J9601 Acute respiratory failure with hypoxia: Secondary | ICD-10-CM | POA: Diagnosis not present

## 2016-05-28 DIAGNOSIS — E1122 Type 2 diabetes mellitus with diabetic chronic kidney disease: Secondary | ICD-10-CM | POA: Diagnosis present

## 2016-05-28 DIAGNOSIS — Z792 Long term (current) use of antibiotics: Secondary | ICD-10-CM | POA: Diagnosis not present

## 2016-05-28 DIAGNOSIS — I700 Atherosclerosis of aorta: Secondary | ICD-10-CM | POA: Diagnosis not present

## 2016-05-28 DIAGNOSIS — I340 Nonrheumatic mitral (valve) insufficiency: Secondary | ICD-10-CM | POA: Diagnosis not present

## 2016-05-28 DIAGNOSIS — R52 Pain, unspecified: Secondary | ICD-10-CM | POA: Diagnosis not present

## 2016-05-28 DIAGNOSIS — M869 Osteomyelitis, unspecified: Secondary | ICD-10-CM | POA: Diagnosis not present

## 2016-05-28 DIAGNOSIS — N183 Chronic kidney disease, stage 3 (moderate): Secondary | ICD-10-CM | POA: Diagnosis not present

## 2016-05-28 DIAGNOSIS — E2740 Unspecified adrenocortical insufficiency: Secondary | ICD-10-CM | POA: Diagnosis present

## 2016-05-28 DIAGNOSIS — R600 Localized edema: Secondary | ICD-10-CM | POA: Diagnosis not present

## 2016-05-28 DIAGNOSIS — E1165 Type 2 diabetes mellitus with hyperglycemia: Secondary | ICD-10-CM | POA: Diagnosis present

## 2016-05-28 DIAGNOSIS — E11621 Type 2 diabetes mellitus with foot ulcer: Secondary | ICD-10-CM | POA: Diagnosis not present

## 2016-05-28 DIAGNOSIS — J9690 Respiratory failure, unspecified, unspecified whether with hypoxia or hypercapnia: Secondary | ICD-10-CM | POA: Diagnosis not present

## 2016-05-28 DIAGNOSIS — L97529 Non-pressure chronic ulcer of other part of left foot with unspecified severity: Secondary | ICD-10-CM | POA: Diagnosis not present

## 2016-05-28 DIAGNOSIS — L02419 Cutaneous abscess of limb, unspecified: Secondary | ICD-10-CM | POA: Diagnosis not present

## 2016-05-28 DIAGNOSIS — M50222 Other cervical disc displacement at C5-C6 level: Secondary | ICD-10-CM | POA: Diagnosis not present

## 2016-05-28 DIAGNOSIS — J9602 Acute respiratory failure with hypercapnia: Secondary | ICD-10-CM | POA: Diagnosis not present

## 2016-05-28 DIAGNOSIS — K4090 Unilateral inguinal hernia, without obstruction or gangrene, not specified as recurrent: Secondary | ICD-10-CM | POA: Diagnosis not present

## 2016-05-28 DIAGNOSIS — J9691 Respiratory failure, unspecified with hypoxia: Secondary | ICD-10-CM | POA: Diagnosis not present

## 2016-05-28 DIAGNOSIS — Z515 Encounter for palliative care: Secondary | ICD-10-CM | POA: Diagnosis not present

## 2016-05-28 DIAGNOSIS — K838 Other specified diseases of biliary tract: Secondary | ICD-10-CM | POA: Diagnosis not present

## 2016-05-28 DIAGNOSIS — M6282 Rhabdomyolysis: Secondary | ICD-10-CM | POA: Diagnosis not present

## 2016-05-28 DIAGNOSIS — M5127 Other intervertebral disc displacement, lumbosacral region: Secondary | ICD-10-CM | POA: Diagnosis not present

## 2016-05-28 DIAGNOSIS — R9431 Abnormal electrocardiogram [ECG] [EKG]: Secondary | ICD-10-CM | POA: Diagnosis not present

## 2016-05-28 DIAGNOSIS — M8610 Other acute osteomyelitis, unspecified site: Secondary | ICD-10-CM | POA: Diagnosis not present

## 2016-05-28 DIAGNOSIS — R293 Abnormal posture: Secondary | ICD-10-CM | POA: Diagnosis not present

## 2016-05-28 DIAGNOSIS — L03116 Cellulitis of left lower limb: Secondary | ICD-10-CM | POA: Diagnosis not present

## 2016-05-28 DIAGNOSIS — I953 Hypotension of hemodialysis: Secondary | ICD-10-CM | POA: Diagnosis not present

## 2016-05-28 DIAGNOSIS — I959 Hypotension, unspecified: Secondary | ICD-10-CM | POA: Diagnosis not present

## 2016-05-28 DIAGNOSIS — I998 Other disorder of circulatory system: Secondary | ICD-10-CM | POA: Diagnosis not present

## 2016-05-28 DIAGNOSIS — I96 Gangrene, not elsewhere classified: Secondary | ICD-10-CM | POA: Diagnosis not present

## 2016-05-28 DIAGNOSIS — M79675 Pain in left toe(s): Secondary | ICD-10-CM | POA: Diagnosis not present

## 2016-05-28 DIAGNOSIS — E119 Type 2 diabetes mellitus without complications: Secondary | ICD-10-CM | POA: Diagnosis not present

## 2016-05-28 DIAGNOSIS — A419 Sepsis, unspecified organism: Secondary | ICD-10-CM | POA: Diagnosis not present

## 2016-05-28 DIAGNOSIS — F05 Delirium due to known physiological condition: Secondary | ICD-10-CM | POA: Diagnosis present

## 2016-05-28 DIAGNOSIS — Z89512 Acquired absence of left leg below knee: Secondary | ICD-10-CM | POA: Diagnosis not present

## 2016-05-28 DIAGNOSIS — R000 Tachycardia, unspecified: Secondary | ICD-10-CM | POA: Diagnosis not present

## 2016-05-28 DIAGNOSIS — Z794 Long term (current) use of insulin: Secondary | ICD-10-CM | POA: Diagnosis not present

## 2016-05-28 DIAGNOSIS — R1310 Dysphagia, unspecified: Secondary | ICD-10-CM | POA: Diagnosis not present

## 2016-05-28 DIAGNOSIS — E1169 Type 2 diabetes mellitus with other specified complication: Secondary | ICD-10-CM | POA: Diagnosis not present

## 2016-05-28 DIAGNOSIS — K560 Paralytic ileus: Secondary | ICD-10-CM | POA: Diagnosis not present

## 2016-05-28 DIAGNOSIS — M0579 Rheumatoid arthritis with rheumatoid factor of multiple sites without organ or systems involvement: Secondary | ICD-10-CM | POA: Diagnosis not present

## 2016-05-28 DIAGNOSIS — B9561 Methicillin susceptible Staphylococcus aureus infection as the cause of diseases classified elsewhere: Secondary | ICD-10-CM | POA: Diagnosis not present

## 2016-05-28 DIAGNOSIS — I471 Supraventricular tachycardia: Secondary | ICD-10-CM | POA: Diagnosis not present

## 2016-05-28 DIAGNOSIS — N186 End stage renal disease: Secondary | ICD-10-CM | POA: Diagnosis not present

## 2016-05-28 DIAGNOSIS — R279 Unspecified lack of coordination: Secondary | ICD-10-CM | POA: Diagnosis not present

## 2016-05-28 DIAGNOSIS — M5126 Other intervertebral disc displacement, lumbar region: Secondary | ICD-10-CM | POA: Diagnosis not present

## 2016-05-28 DIAGNOSIS — E872 Acidosis: Secondary | ICD-10-CM | POA: Diagnosis not present

## 2016-05-28 DIAGNOSIS — E8770 Fluid overload, unspecified: Secondary | ICD-10-CM | POA: Diagnosis not present

## 2016-05-28 DIAGNOSIS — K567 Ileus, unspecified: Secondary | ICD-10-CM | POA: Diagnosis not present

## 2016-05-28 DIAGNOSIS — M79672 Pain in left foot: Secondary | ICD-10-CM | POA: Diagnosis not present

## 2016-05-28 DIAGNOSIS — R140 Abdominal distension (gaseous): Secondary | ICD-10-CM | POA: Diagnosis not present

## 2016-05-28 DIAGNOSIS — L89153 Pressure ulcer of sacral region, stage 3: Secondary | ICD-10-CM | POA: Diagnosis not present

## 2016-05-28 DIAGNOSIS — M79662 Pain in left lower leg: Secondary | ICD-10-CM | POA: Diagnosis not present

## 2016-05-28 DIAGNOSIS — B9562 Methicillin resistant Staphylococcus aureus infection as the cause of diseases classified elsewhere: Secondary | ICD-10-CM | POA: Diagnosis not present

## 2016-05-28 DIAGNOSIS — M868X7 Other osteomyelitis, ankle and foot: Secondary | ICD-10-CM | POA: Diagnosis not present

## 2016-05-28 DIAGNOSIS — N179 Acute kidney failure, unspecified: Secondary | ICD-10-CM | POA: Diagnosis not present

## 2016-05-28 DIAGNOSIS — D72829 Elevated white blood cell count, unspecified: Secondary | ICD-10-CM | POA: Diagnosis not present

## 2016-05-28 DIAGNOSIS — I4589 Other specified conduction disorders: Secondary | ICD-10-CM | POA: Diagnosis not present

## 2016-05-28 DIAGNOSIS — M47811 Spondylosis without myelopathy or radiculopathy, occipito-atlanto-axial region: Secondary | ICD-10-CM | POA: Diagnosis not present

## 2016-05-28 DIAGNOSIS — M86272 Subacute osteomyelitis, left ankle and foot: Secondary | ICD-10-CM | POA: Diagnosis not present

## 2016-05-28 DIAGNOSIS — J811 Chronic pulmonary edema: Secondary | ICD-10-CM | POA: Diagnosis not present

## 2016-05-28 DIAGNOSIS — T8789 Other complications of amputation stump: Secondary | ICD-10-CM | POA: Diagnosis not present

## 2016-05-28 DIAGNOSIS — M6281 Muscle weakness (generalized): Secondary | ICD-10-CM | POA: Diagnosis not present

## 2016-05-28 DIAGNOSIS — I4581 Long QT syndrome: Secondary | ICD-10-CM | POA: Diagnosis not present

## 2016-05-28 DIAGNOSIS — R918 Other nonspecific abnormal finding of lung field: Secondary | ICD-10-CM | POA: Diagnosis not present

## 2016-05-28 DIAGNOSIS — I5022 Chronic systolic (congestive) heart failure: Secondary | ICD-10-CM | POA: Diagnosis not present

## 2016-05-28 DIAGNOSIS — I4891 Unspecified atrial fibrillation: Secondary | ICD-10-CM | POA: Diagnosis not present

## 2016-05-28 DIAGNOSIS — M170 Bilateral primary osteoarthritis of knee: Secondary | ICD-10-CM | POA: Diagnosis not present

## 2016-05-28 DIAGNOSIS — K5660 Unspecified intestinal obstruction: Secondary | ICD-10-CM | POA: Diagnosis not present

## 2016-05-28 DIAGNOSIS — K30 Functional dyspepsia: Secondary | ICD-10-CM | POA: Diagnosis not present

## 2016-05-28 DIAGNOSIS — L97524 Non-pressure chronic ulcer of other part of left foot with necrosis of bone: Secondary | ICD-10-CM | POA: Diagnosis not present

## 2016-05-28 DIAGNOSIS — N2889 Other specified disorders of kidney and ureter: Secondary | ICD-10-CM | POA: Diagnosis not present

## 2016-05-28 DIAGNOSIS — E1121 Type 2 diabetes mellitus with diabetic nephropathy: Secondary | ICD-10-CM | POA: Diagnosis not present

## 2016-05-28 DIAGNOSIS — K828 Other specified diseases of gallbladder: Secondary | ICD-10-CM | POA: Diagnosis not present

## 2016-05-28 DIAGNOSIS — N170 Acute kidney failure with tubular necrosis: Secondary | ICD-10-CM | POA: Diagnosis not present

## 2016-05-28 DIAGNOSIS — I350 Nonrheumatic aortic (valve) stenosis: Secondary | ICD-10-CM | POA: Diagnosis not present

## 2016-05-28 DIAGNOSIS — L539 Erythematous condition, unspecified: Secondary | ICD-10-CM | POA: Diagnosis not present

## 2016-05-28 DIAGNOSIS — I491 Atrial premature depolarization: Secondary | ICD-10-CM | POA: Diagnosis not present

## 2016-05-29 DIAGNOSIS — L02419 Cutaneous abscess of limb, unspecified: Secondary | ICD-10-CM | POA: Insufficient documentation

## 2016-05-29 DIAGNOSIS — L03119 Cellulitis of unspecified part of limb: Secondary | ICD-10-CM

## 2016-06-25 HISTORY — PX: MUSCLE BIOPSY: SHX716

## 2016-08-02 ENCOUNTER — Inpatient Hospital Stay
Admission: RE | Admit: 2016-08-02 | Discharge: 2016-08-26 | Disposition: A | Discharge status: Other Acute Inpatient Hospital | Payer: Medicare Other | Source: Ambulatory Visit | Attending: Internal Medicine | Admitting: Internal Medicine

## 2016-08-02 DIAGNOSIS — I5023 Acute on chronic systolic (congestive) heart failure: Secondary | ICD-10-CM | POA: Diagnosis present

## 2016-08-02 DIAGNOSIS — I1311 Hypertensive heart and chronic kidney disease without heart failure, with stage 5 chronic kidney disease, or end stage renal disease: Secondary | ICD-10-CM | POA: Diagnosis not present

## 2016-08-02 DIAGNOSIS — I2510 Atherosclerotic heart disease of native coronary artery without angina pectoris: Secondary | ICD-10-CM | POA: Diagnosis not present

## 2016-08-02 DIAGNOSIS — D631 Anemia in chronic kidney disease: Secondary | ICD-10-CM | POA: Diagnosis not present

## 2016-08-02 DIAGNOSIS — I4581 Long QT syndrome: Secondary | ICD-10-CM | POA: Diagnosis not present

## 2016-08-02 DIAGNOSIS — G729 Myopathy, unspecified: Secondary | ICD-10-CM | POA: Diagnosis not present

## 2016-08-02 DIAGNOSIS — J9691 Respiratory failure, unspecified with hypoxia: Secondary | ICD-10-CM | POA: Diagnosis not present

## 2016-08-02 DIAGNOSIS — R279 Unspecified lack of coordination: Secondary | ICD-10-CM | POA: Diagnosis not present

## 2016-08-02 DIAGNOSIS — S88112D Complete traumatic amputation at level between knee and ankle, left lower leg, subsequent encounter: Secondary | ICD-10-CM | POA: Diagnosis not present

## 2016-08-02 DIAGNOSIS — M24542 Contracture, left hand: Secondary | ICD-10-CM | POA: Diagnosis not present

## 2016-08-02 DIAGNOSIS — R1312 Dysphagia, oropharyngeal phase: Secondary | ICD-10-CM | POA: Diagnosis not present

## 2016-08-02 DIAGNOSIS — N186 End stage renal disease: Secondary | ICD-10-CM | POA: Diagnosis not present

## 2016-08-02 DIAGNOSIS — N189 Chronic kidney disease, unspecified: Secondary | ICD-10-CM | POA: Diagnosis not present

## 2016-08-02 DIAGNOSIS — G8929 Other chronic pain: Secondary | ICD-10-CM | POA: Diagnosis not present

## 2016-08-02 DIAGNOSIS — E1121 Type 2 diabetes mellitus with diabetic nephropathy: Secondary | ICD-10-CM | POA: Diagnosis not present

## 2016-08-02 DIAGNOSIS — R05 Cough: Secondary | ICD-10-CM | POA: Diagnosis not present

## 2016-08-02 DIAGNOSIS — Z992 Dependence on renal dialysis: Secondary | ICD-10-CM | POA: Diagnosis not present

## 2016-08-02 DIAGNOSIS — M86072 Acute hematogenous osteomyelitis, left ankle and foot: Secondary | ICD-10-CM | POA: Diagnosis not present

## 2016-08-02 DIAGNOSIS — D638 Anemia in other chronic diseases classified elsewhere: Secondary | ICD-10-CM | POA: Diagnosis not present

## 2016-08-02 DIAGNOSIS — E875 Hyperkalemia: Secondary | ICD-10-CM | POA: Diagnosis not present

## 2016-08-02 DIAGNOSIS — Z7952 Long term (current) use of systemic steroids: Secondary | ICD-10-CM | POA: Diagnosis not present

## 2016-08-02 DIAGNOSIS — E785 Hyperlipidemia, unspecified: Secondary | ICD-10-CM | POA: Diagnosis not present

## 2016-08-02 DIAGNOSIS — Z89522 Acquired absence of left knee: Secondary | ICD-10-CM | POA: Diagnosis not present

## 2016-08-02 DIAGNOSIS — E1122 Type 2 diabetes mellitus with diabetic chronic kidney disease: Secondary | ICD-10-CM | POA: Diagnosis present

## 2016-08-02 DIAGNOSIS — R0602 Shortness of breath: Secondary | ICD-10-CM | POA: Diagnosis not present

## 2016-08-02 DIAGNOSIS — R5381 Other malaise: Secondary | ICD-10-CM | POA: Diagnosis not present

## 2016-08-02 DIAGNOSIS — I509 Heart failure, unspecified: Secondary | ICD-10-CM | POA: Diagnosis not present

## 2016-08-02 DIAGNOSIS — I132 Hypertensive heart and chronic kidney disease with heart failure and with stage 5 chronic kidney disease, or end stage renal disease: Secondary | ICD-10-CM | POA: Diagnosis present

## 2016-08-02 DIAGNOSIS — M0609 Rheumatoid arthritis without rheumatoid factor, multiple sites: Secondary | ICD-10-CM | POA: Diagnosis not present

## 2016-08-02 DIAGNOSIS — Z8249 Family history of ischemic heart disease and other diseases of the circulatory system: Secondary | ICD-10-CM | POA: Diagnosis not present

## 2016-08-02 DIAGNOSIS — N2581 Secondary hyperparathyroidism of renal origin: Secondary | ICD-10-CM | POA: Diagnosis not present

## 2016-08-02 DIAGNOSIS — N179 Acute kidney failure, unspecified: Secondary | ICD-10-CM | POA: Diagnosis present

## 2016-08-02 DIAGNOSIS — Z833 Family history of diabetes mellitus: Secondary | ICD-10-CM | POA: Diagnosis not present

## 2016-08-02 DIAGNOSIS — L8990 Pressure ulcer of unspecified site, unspecified stage: Secondary | ICD-10-CM | POA: Diagnosis not present

## 2016-08-02 DIAGNOSIS — E1129 Type 2 diabetes mellitus with other diabetic kidney complication: Secondary | ICD-10-CM | POA: Diagnosis not present

## 2016-08-02 DIAGNOSIS — Z4781 Encounter for orthopedic aftercare following surgical amputation: Secondary | ICD-10-CM | POA: Diagnosis not present

## 2016-08-02 DIAGNOSIS — L97112 Non-pressure chronic ulcer of right thigh with fat layer exposed: Secondary | ICD-10-CM | POA: Diagnosis not present

## 2016-08-02 DIAGNOSIS — Z823 Family history of stroke: Secondary | ICD-10-CM | POA: Diagnosis not present

## 2016-08-02 DIAGNOSIS — I10 Essential (primary) hypertension: Secondary | ICD-10-CM | POA: Diagnosis not present

## 2016-08-02 DIAGNOSIS — E11622 Type 2 diabetes mellitus with other skin ulcer: Secondary | ICD-10-CM | POA: Diagnosis not present

## 2016-08-02 DIAGNOSIS — J439 Emphysema, unspecified: Secondary | ICD-10-CM | POA: Diagnosis not present

## 2016-08-02 DIAGNOSIS — D509 Iron deficiency anemia, unspecified: Secondary | ICD-10-CM | POA: Diagnosis not present

## 2016-08-02 DIAGNOSIS — Z79891 Long term (current) use of opiate analgesic: Secondary | ICD-10-CM | POA: Diagnosis not present

## 2016-08-02 DIAGNOSIS — Z794 Long term (current) use of insulin: Secondary | ICD-10-CM | POA: Diagnosis not present

## 2016-08-02 DIAGNOSIS — I5020 Unspecified systolic (congestive) heart failure: Secondary | ICD-10-CM | POA: Diagnosis not present

## 2016-08-02 DIAGNOSIS — M069 Rheumatoid arthritis, unspecified: Secondary | ICD-10-CM | POA: Diagnosis not present

## 2016-08-02 DIAGNOSIS — R293 Abnormal posture: Secondary | ICD-10-CM | POA: Diagnosis not present

## 2016-08-02 DIAGNOSIS — Z7982 Long term (current) use of aspirin: Secondary | ICD-10-CM | POA: Diagnosis not present

## 2016-08-02 DIAGNOSIS — M6281 Muscle weakness (generalized): Secondary | ICD-10-CM | POA: Diagnosis not present

## 2016-08-02 DIAGNOSIS — R069 Unspecified abnormalities of breathing: Secondary | ICD-10-CM | POA: Diagnosis not present

## 2016-08-02 DIAGNOSIS — R32 Unspecified urinary incontinence: Secondary | ICD-10-CM | POA: Diagnosis present

## 2016-08-02 DIAGNOSIS — I5022 Chronic systolic (congestive) heart failure: Secondary | ICD-10-CM | POA: Diagnosis not present

## 2016-08-02 DIAGNOSIS — R7881 Bacteremia: Secondary | ICD-10-CM | POA: Diagnosis not present

## 2016-08-02 DIAGNOSIS — E871 Hypo-osmolality and hyponatremia: Secondary | ICD-10-CM | POA: Diagnosis present

## 2016-08-02 DIAGNOSIS — G40909 Epilepsy, unspecified, not intractable, without status epilepticus: Secondary | ICD-10-CM | POA: Diagnosis present

## 2016-08-02 DIAGNOSIS — Z89512 Acquired absence of left leg below knee: Secondary | ICD-10-CM | POA: Diagnosis not present

## 2016-08-02 DIAGNOSIS — D649 Anemia, unspecified: Secondary | ICD-10-CM | POA: Diagnosis not present

## 2016-08-02 DIAGNOSIS — E876 Hypokalemia: Secondary | ICD-10-CM | POA: Diagnosis present

## 2016-08-02 DIAGNOSIS — D539 Nutritional anemia, unspecified: Secondary | ICD-10-CM | POA: Diagnosis not present

## 2016-08-02 DIAGNOSIS — G894 Chronic pain syndrome: Secondary | ICD-10-CM | POA: Diagnosis not present

## 2016-08-02 DIAGNOSIS — E1149 Type 2 diabetes mellitus with other diabetic neurological complication: Secondary | ICD-10-CM | POA: Diagnosis not present

## 2016-08-02 DIAGNOSIS — L03116 Cellulitis of left lower limb: Secondary | ICD-10-CM | POA: Diagnosis not present

## 2016-08-02 DIAGNOSIS — M24541 Contracture, right hand: Secondary | ICD-10-CM | POA: Diagnosis not present

## 2016-08-03 DIAGNOSIS — N2581 Secondary hyperparathyroidism of renal origin: Secondary | ICD-10-CM | POA: Diagnosis not present

## 2016-08-03 DIAGNOSIS — N186 End stage renal disease: Secondary | ICD-10-CM | POA: Diagnosis not present

## 2016-08-03 DIAGNOSIS — D631 Anemia in chronic kidney disease: Secondary | ICD-10-CM | POA: Diagnosis not present

## 2016-08-03 DIAGNOSIS — Z992 Dependence on renal dialysis: Secondary | ICD-10-CM | POA: Diagnosis not present

## 2016-08-03 DIAGNOSIS — D509 Iron deficiency anemia, unspecified: Secondary | ICD-10-CM | POA: Diagnosis not present

## 2016-08-04 ENCOUNTER — Non-Acute Institutional Stay (SKILLED_NURSING_FACILITY): Payer: Medicare Other | Admitting: Internal Medicine

## 2016-08-04 ENCOUNTER — Encounter: Payer: Self-pay | Admitting: Internal Medicine

## 2016-08-04 DIAGNOSIS — Z89522 Acquired absence of left knee: Secondary | ICD-10-CM | POA: Diagnosis not present

## 2016-08-04 DIAGNOSIS — M069 Rheumatoid arthritis, unspecified: Secondary | ICD-10-CM | POA: Diagnosis not present

## 2016-08-04 DIAGNOSIS — E1149 Type 2 diabetes mellitus with other diabetic neurological complication: Secondary | ICD-10-CM | POA: Diagnosis not present

## 2016-08-04 DIAGNOSIS — I2510 Atherosclerotic heart disease of native coronary artery without angina pectoris: Secondary | ICD-10-CM | POA: Diagnosis not present

## 2016-08-04 DIAGNOSIS — R7881 Bacteremia: Secondary | ICD-10-CM

## 2016-08-04 DIAGNOSIS — Z89512 Acquired absence of left leg below knee: Secondary | ICD-10-CM

## 2016-08-04 NOTE — Progress Notes (Signed)
Location:   [facility] Room Number: [id number] Place of Service:  SNF (31) Provider:  Nancy Marus, MD  Patient Care Team: Lala Lund, MD as PCP - General (Internal Medicine)  Extended Emergency Contact Information Primary Emergency Contact: Salado,Martha Address: [address]          [place], [place] [address] [place] Phone: [phone] Mobile Phone: [phone] Relation: Spouse Secondary Emergency Contact: [place] Phone: [phone] Relation: Daughter  Code Status:  Full Code Goals of care: Advanced Directive information Advanced Directives 08/04/2016  Does patient have an advance directive? Yes  Type of Advance Directive (No Data)  Does patient want to make changes to advanced directive? No - Patient declined  Copy of advanced directive(s) in chart? Yes  Would patient like information on creating an advanced directive? -  Pre-existing out of facility DNR order (yellow form or pink MOST form) -     Chief Complaint  Patient presents with  . Acute Visit    MSSA, Bacteremia resulting in left amputation  Status post hospitalization  HPI:  Pt is a 65 y.o. 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  Patient was recently admitted for left lower extremity cellulitis and wound and found to have osteomyelitis in MSSA a bacteremia--he was treated with IV antibiotics with transition oral agents Flagyl and Ancef.  Hospitalization in June was complicated with a cold blue with hypotension tachycardia and hypoxia.  He had multiple episodes of SVT requiring a [place]-also anemia that required blood transfusion 3 units and also an ileus.  His status quickly improved with Lasix.  He intermittently required BiPAP for respiratory distress as well as a component of respiratory acidosis was  felt this possibly was called by Tylenol and amoxicillin.  His oxacillin was switched toCefazolin .  In dialysis was initiated due to renal insufficiency.  An arterial venous catheter to the left internal jugular was placed on July 26.  In regards to the bacteremia he did receive a left below the knee amputation on July 6.  . In regards to SVT which he experienced during hospitalization he's been discharged on amiodarone unable to tolerate his home dose of metoprolol because of hypotension he is on Toprol XL 25 mg day currently and this appears to be stabilized.  Regards to anemia requiring transfusion this was thought likely multifactorial had been on Epogen per nephrology.  He also has a history of re-renal renal insufficiency likely on prednisone developed low blood pressures with steroids were increased.  Stress dose steroids tapered slowly.  He is on a prednisone taper currently.  Regards diabetes he continues on NPH 5 units twice a day apparently tends to get this once a day on dialysis days.  Patient does have a history of chronic rheumatoid arthritis methadone was previously held because of a prolonged QT.  Home methadone dose was titrated 3 times daily 10 mg.  .  Currently he appears to be significantly improved he is resting in bed comfortably he does not really have any acute complaints.     Past Medical History:  Diagnosis Date  . Chronic back pain   . Chronic neck pain   . Coronary artery disease 1993   angioplasty after AMI  . Diabetes mellitus   . Diabetic neuropathy ([place])   . Hyperkalemia   .  Hypertension   . Pancytopenia ([place]) 08/13/2014  . 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  . Rheumatoid arthritis(714.0)   . Seizure ([place]) 08/13/2014   Pt denies   Past Surgical History:  Procedure Laterality Date  . CARDIAC SURGERY    . FRACTURE SURGERY    . JOINT REPLACEMENT      Allergies  Allergen Reactions    . Sulfa Antibiotics Rash    Current Outpatient Prescriptions on File Prior to Visit  Medication Sig Dispense Refill  . aspirin 81 MG chewable tablet Chew 81 mg by mouth every morning.     . hydroxychloroquine (PLAQUENIL) 200 MG tablet Take 200 mg by mouth daily.     . metoprolol succinate (TOPROL-XL) 25 MG 24 hr tablet Take 25 mg by mouth every morning.    . polyethylene glycol powder (MIRALAX) powder Take 17 g by mouth 2 (two) times daily.    . predniSONE (DELTASONE) 5 MG tablet Take 5 mg by mouth every morning. START ON 08/10/16    . senna-docusate (SENOKOT-S) 8.6-50 MG tablet Take 2 tablets by mouth daily as needed for mild constipation.    . Tamsulosin HCl (FLOMAX) 0.4 MG CAPS Take 0.4 mg by mouth at bedtime.      No current facility-administered medications on file prior to visit.      Review of Systems   In general does not complain of any fever or chills says he is feeling better.  Skin does have a history of a sacral ulcer which is followed by wound care.  Head ears eyes nose mouth and throat does not clinically visual changes or sore throat.  Respirations not complaining of any shortness breath or cough currently.  Cardiac no chest pain has minimal lower extremity edema.  GI is not complaining of any nausea vomiting diarrhea constipation or abdominal discomfort.  GU is not complaining of dysuria.  Muscle skeletal is status post left BKA is not currently complaining of significant pain however has a history of chronic pain with rheumatoid arthritis.  Neurologic is not complaining of dizziness headache or numbness currently.  In psych appears to be in very good spirits does not complain of anxiety or depression  There is no immunization history for the selected administration types on file for this patient. Pertinent  Health Maintenance Due  Topic Date Due  . FOOT EXAM  09/05/1961  . OPHTHALMOLOGY EXAM  09/05/1959  . URINE MICROALBUMIN  09/05/1959  . COLONOSCOPY   09/05/1999  . HEMOGLOBIN A1C  06/11/2013  . PNA vac Low Risk Adult (1 of 2 - PCV13) 09/04/2014  . INFLUENZA VACCINE  07/26/2016   No flowsheet data found. Functional Status Survey:    Vitals:   08/04/16 1008  BP: 125/65  Pulse: 83  Resp: 20  Temp: 98 F (36.7 C)  TempSrc: Oral  SpO2: 99%   There is no height or weight on file to calculate BMI. Physical Exam   In general this is a very pleasant frail elderly male in no distress resting comfortably in bed.  His skin is warm and dry.  Sacral ulcer is currently covered this is followed by wound care.  Eyes pupils appear reactive to light sclera and conjunctiva clear visual acuity appears grossly intact.  Oropharynx clear mucous membranes moist.  Chest is clear to auscultation there is no labored breathing.  Heart is regular rate and rhythm without murmur gallop or rub he has trace lower extremity edema.  GU  appears to be within normal limits.  Musculoskeletal he is status post left BKA stump is currently wrapped he is status post amputation of all toes except his right great toe on the right foot-she has flexion contractures arthritic changes of his hands bilaterally.  He does have some edema of his left upper extremity which he describes as fairly chronic.  He appears to have some left upper extremity weakness which she says is chronic as well.  Neurologic is grossly intact other than that appears to have some left upper extremity weakness which he describes as chronic cranial nerves appear to be intact his speech is clear.  Psych he is alert and oriented very pleasant and appropriate  Labs reviewed:  08/02/2016.  Hemoglobin 8.1 platelets 191 WBC 6.4.  Sodium 131 potassium 6.4 BUN 29 creatinine 1.88.  08/01/2016.  Albumin 1.8 AST 30 ALT 21 GGT 323-bilirubin total 0.8 alkaline phosphatase 499  Recent Labs  12/04/15 1611  NA 133*  K 5.5*  CL 96*  CO2 28  GLUCOSE 349*  BUN 29*  CREATININE 1.48*  CALCIUM  8.7*   No results for input(s): AST, ALT, ALKPHOS, BILITOT, PROT, ALBUMIN in the last 8760 hours. No results for input(s): WBC, NEUTROABS, HGB, HCT, MCV, PLT in the last 8760 hours. Lab Results  Component Value Date   TSH 2.050 08/13/2014   Lab Results  Component Value Date   HGBA1C 6.9 (H) 12/11/2012   No results found for: CHOL, HDL, LDLCALC, LDLDIRECT, TRIG, CHOLHDL  Significant Diagnostic Results in last 30 days:  No results found.  Assessment/Plan . #1 history of bacteremia with MSSA-status post left below the knee amputation appears to have tolerated this well has completed antibiotics-at this point will monitor will need continued PT and OT as tolerated.  #2 history of end stage renal disease again he is on dialysis appears to be tolerating this well does receive dialysis 3 times a week.  #3 history of SVT-he is now on amiodarone with recommendation to do pulmonary function tests on discharge-he continues on Toprol XL as well as.  Will check a CMP next week to follow liver function tests.  #4-history coronary artery disease at this point appears to be stable he is on aspirin atorvastatin and Toprol.  #5 history of macrocytic anemia thought multi-factorial B12 was normal-but relation to transfuse for hemoglobin less than 7 will check a CBC next week and this has been followed I suspect by dialysis as well at one point apparently got Epogen.  #6 history of adrenal insufficiency he is chronically on prednisone with recommendations for a tapered-.  #7 history of diabetes he is on NPH 5 units twice a day with meals-at this point will monitor CBGs. So far CBGs appear to be largely in the lower --higher 100s  It appears his blood sugars were well controlled in the hospital largely in the 100s.  #8 history of chronic pain with history of rheumatoid arthritis-methadone had been held at one point because of prolonged QT-O methadone was titrated to 3 times a day 10 mg a dose-is also  on oxycodone 5 mg every 12 hours when necessary-recommendation is to periodically monitor Q Tc.  He is also receiving hydroxychloroquine 20 mg a day-also oxycodone 5 mg every 12 hours when necessary.  #9 sacral ulcer this is followed by wound care and apparently stable at this point.  [id number] note greater than 45 minutes spent assessing patient-reviewing his labs-reviewing his chart-discussing his status with nursing-and coordinating and  formulating  a plan of care for numerous diagnoses-of note greater than 50% of time spent coordinating plan of care     Oralia Manis, Eva

## 2016-08-05 DIAGNOSIS — N2581 Secondary hyperparathyroidism of renal origin: Secondary | ICD-10-CM | POA: Diagnosis not present

## 2016-08-05 DIAGNOSIS — D509 Iron deficiency anemia, unspecified: Secondary | ICD-10-CM | POA: Diagnosis not present

## 2016-08-05 DIAGNOSIS — Z992 Dependence on renal dialysis: Secondary | ICD-10-CM | POA: Diagnosis not present

## 2016-08-05 DIAGNOSIS — D631 Anemia in chronic kidney disease: Secondary | ICD-10-CM | POA: Diagnosis not present

## 2016-08-05 DIAGNOSIS — N186 End stage renal disease: Secondary | ICD-10-CM | POA: Diagnosis not present

## 2016-08-08 DIAGNOSIS — Z992 Dependence on renal dialysis: Secondary | ICD-10-CM | POA: Diagnosis not present

## 2016-08-08 DIAGNOSIS — N186 End stage renal disease: Secondary | ICD-10-CM | POA: Diagnosis not present

## 2016-08-08 DIAGNOSIS — D631 Anemia in chronic kidney disease: Secondary | ICD-10-CM | POA: Diagnosis not present

## 2016-08-08 DIAGNOSIS — D509 Iron deficiency anemia, unspecified: Secondary | ICD-10-CM | POA: Diagnosis not present

## 2016-08-08 DIAGNOSIS — N2581 Secondary hyperparathyroidism of renal origin: Secondary | ICD-10-CM | POA: Diagnosis not present

## 2016-08-09 ENCOUNTER — Encounter (HOSPITAL_COMMUNITY)
Admission: RE | Admit: 2016-08-09 | Discharge: 2016-08-09 | Disposition: A | Discharge status: Home / Self Care | Payer: Medicare Other | Source: Skilled Nursing Facility | Attending: Internal Medicine | Admitting: Internal Medicine

## 2016-08-09 ENCOUNTER — Encounter: Payer: Self-pay | Admitting: Internal Medicine

## 2016-08-09 ENCOUNTER — Non-Acute Institutional Stay (SKILLED_NURSING_FACILITY): Payer: Medicare Other | Admitting: Internal Medicine

## 2016-08-09 DIAGNOSIS — E1149 Type 2 diabetes mellitus with other diabetic neurological complication: Secondary | ICD-10-CM

## 2016-08-09 DIAGNOSIS — Z992 Dependence on renal dialysis: Secondary | ICD-10-CM

## 2016-08-09 DIAGNOSIS — Z89512 Acquired absence of left leg below knee: Secondary | ICD-10-CM | POA: Insufficient documentation

## 2016-08-09 DIAGNOSIS — I10 Essential (primary) hypertension: Secondary | ICD-10-CM | POA: Diagnosis not present

## 2016-08-09 DIAGNOSIS — N189 Chronic kidney disease, unspecified: Secondary | ICD-10-CM | POA: Diagnosis not present

## 2016-08-09 DIAGNOSIS — D631 Anemia in chronic kidney disease: Secondary | ICD-10-CM | POA: Diagnosis not present

## 2016-08-09 DIAGNOSIS — M069 Rheumatoid arthritis, unspecified: Secondary | ICD-10-CM | POA: Diagnosis not present

## 2016-08-09 DIAGNOSIS — E785 Hyperlipidemia, unspecified: Secondary | ICD-10-CM

## 2016-08-09 DIAGNOSIS — I2510 Atherosclerotic heart disease of native coronary artery without angina pectoris: Secondary | ICD-10-CM | POA: Diagnosis not present

## 2016-08-09 DIAGNOSIS — N186 End stage renal disease: Secondary | ICD-10-CM | POA: Diagnosis not present

## 2016-08-09 DIAGNOSIS — M86072 Acute hematogenous osteomyelitis, left ankle and foot: Secondary | ICD-10-CM | POA: Insufficient documentation

## 2016-08-09 DIAGNOSIS — N183 Chronic kidney disease, stage 3 (moderate): Secondary | ICD-10-CM | POA: Insufficient documentation

## 2016-08-09 LAB — CBC WITH DIFFERENTIAL/PLATELET
BASOS ABS: 0 10*3/uL (ref 0.0–0.1)
BASOS PCT: 0 %
EOS ABS: 0.1 10*3/uL (ref 0.0–0.7)
Eosinophils Relative: 3 %
HCT: 23.2 % — ABNORMAL LOW (ref 39.0–52.0)
HEMOGLOBIN: 7.2 g/dL — AB (ref 13.0–17.0)
Lymphocytes Relative: 42 %
Lymphs Abs: 2.1 10*3/uL (ref 0.7–4.0)
MCH: 32.6 pg (ref 26.0–34.0)
MCHC: 31 g/dL (ref 30.0–36.0)
MCV: 105 fL — ABNORMAL HIGH (ref 78.0–100.0)
MONOS PCT: 11 %
Monocytes Absolute: 0.5 10*3/uL (ref 0.1–1.0)
NEUTROS PCT: 44 %
Neutro Abs: 2.2 10*3/uL (ref 1.7–7.7)
Platelets: 211 10*3/uL (ref 150–400)
RBC: 2.21 MIL/uL — AB (ref 4.22–5.81)
RDW: 17.6 % — ABNORMAL HIGH (ref 11.5–15.5)
WBC: 5 10*3/uL (ref 4.0–10.5)

## 2016-08-09 LAB — BASIC METABOLIC PANEL
ANION GAP: 4 — AB (ref 5–15)
BUN: 15 mg/dL (ref 6–20)
CHLORIDE: 102 mmol/L (ref 101–111)
CO2: 29 mmol/L (ref 22–32)
CREATININE: 1.37 mg/dL — AB (ref 0.61–1.24)
Calcium: 7.6 mg/dL — ABNORMAL LOW (ref 8.9–10.3)
GFR calc non Af Amer: 52 mL/min — ABNORMAL LOW (ref >60)
Glucose, Bld: 91 mg/dL (ref 65–99)
Potassium: 4.2 mmol/L (ref 3.5–5.1)
SODIUM: 135 mmol/L (ref 135–145)

## 2016-08-09 LAB — HEPATIC FUNCTION PANEL
ALBUMIN: 1.8 g/dL — AB (ref 3.5–5.0)
ALK PHOS: 320 U/L — AB (ref 38–126)
ALT: 8 U/L — ABNORMAL LOW (ref 17–63)
AST: 28 U/L (ref 15–41)
Bilirubin, Direct: 0.3 mg/dL (ref 0.1–0.5)
Indirect Bilirubin: 0.5 mg/dL (ref 0.3–0.9)
TOTAL PROTEIN: 4.5 g/dL — AB (ref 6.5–8.1)
Total Bilirubin: 0.8 mg/dL (ref 0.3–1.2)

## 2016-08-09 NOTE — Progress Notes (Signed)
Provider:  Veleta Miners Location:   [facility] Room Number: [id number] Place of Service:  SNF (31)  PCP: Lala Lund, MD Patient Care Team: Lala Lund, MD as PCP - General (Internal Medicine)  Extended Emergency Contact Information Primary Emergency Contact: Cassady,Martha Address: [address]          [place], [place] [address] [place] Phone: [phone] Mobile Phone: [phone] Relation: Spouse Secondary Emergency Contact: Jannet [place] Phone: [phone] Relation: Daughter  Code Status: Full Code Goals of Care: Advanced Directive information Advanced Directives 08/04/2016  Does patient have an advance directive? Yes  Type of Advance Directive (No Data)  Does patient want to make changes to advanced directive? No - Patient declined  Copy of advanced directive(s) in chart? Yes  Would patient like information on creating an advanced directive? -  Pre-existing out of facility DNR order (yellow form or pink MOST form) -      Chief Complaint  Patient presents with  . New Admit To SNF    HPI: Patient is a 65 y.o. male seen today for admission to SNF for Physical therapy. Most of the history was obtained from the patient and his wife. Patient was admitted to [facility]. He had prolong stay due to Osteomyelitis, S/P BKA of left leg., Acute on chronic renal failure requiring Dialysis. He also had Multiple episodes of SVT requiring Adenosine. Patient also had acute respiratory failure due to volume overload. Patient is doing better now. He is eating well and seems in good spirits. He was not having any Chest pain or SOB. Just occassional dry cough. Still feeling weak requiring  Assistance.   Past Medical History:  Diagnosis Date  . Chronic back pain   . Chronic neck pain   . Coronary artery disease 1993   angioplasty after AMI  . Diabetes mellitus   . Diabetic neuropathy ([place])   .  Hyperkalemia   . Hypertension   . Pancytopenia ([place]) 08/13/2014  . 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  . Rheumatoid arthritis(714.0)   . Seizure ([place]) 08/13/2014   Pt denies   Past Surgical History:  Procedure Laterality Date  . CARDIAC SURGERY    . FRACTURE SURGERY    . JOINT REPLACEMENT      reports that he has never smoked. He has never used smokeless tobacco. He reports that he does not drink alcohol or use drugs. Social History   Social History  . Marital status: Married    Spouse name: N/A  . Number of children: N/A  . Years of education: N/A   Occupational History  . Not on file.   Social History Main Topics  . Smoking status: Never Smoker  . Smokeless tobacco: Never Used  . Alcohol use No  . Drug use: No  . Sexual activity: Not on file   Other Topics Concern  . Not on file   Social History Narrative  . No narrative on file    Functional Status Survey:    Family History  Problem Relation Age of Onset  . Diabetes Father   . Dementia Father   . Heart attack Brother     multiple brothers with MIs  . Hypertension Mother   . Stroke Mother       Allergies  Allergen Reactions  . Sulfa Antibiotics Rash    Current Outpatient Prescriptions on File Prior to Visit  Medication  Sig Dispense Refill  . acetaminophen (TYLENOL) 325 MG tablet Take 650 mg by mouth every 6 (six) hours as needed.    [name] albuterol (ACCUNEB) 0.63 MG/3ML nebulizer solution Take 1 ampule by nebulization every 4 (four) hours as needed for wheezing.    [name] amiodarone (PACERONE) 200 MG tablet Take 200 mg by mouth daily.    . Artificial Tear Ointment (REFRESH P.M. OP) Apply to eye. ONE APPLICATION BOTH EYES. For dry eyes not relieved by theratears every 6 hours    . aspirin 81 MG chewable tablet Chew 81 mg by mouth every morning.     [name] atorvastatin (LIPITOR) 80 MG tablet Take 80 mg by mouth daily.    . Carboxymethylcellulose Sodium (THERATEARS)  0.25 % SOLN Apply 2 drops to both eyes three times a day    . diclofenac sodium (VOLTAREN) 1 % GEL Apply to site of pain four times a day as needed    . docusate sodium (COLACE) 100 MG capsule Take 100 mg by mouth 2 (two) times daily.    . hydroxychloroquine (PLAQUENIL) 200 MG tablet Take 200 mg by mouth daily.     [name] HYPROMELLOSE OP Apply to eye. (Artificial tears,)Apply one drop to each eye as needed for dry eyes.    . insulin NPH Human (HUMULIN N) 100 UNIT/ML injection Give 5 units subcutaneous twice a day    . Melatonin 3 MG TABS Take 2 tablets by mouth at bedtime    . methadone (DOLOPHINE) 10 MG tablet Take 10 mg by mouth every 8 (eight) hours.    . metoprolol succinate (TOPROL-XL) 25 MG 24 hr tablet Take 25 mg by mouth every morning.    . Multiple Vitamins-Minerals (THERA-M ENHANCED PO) 1 tablet by mouth daily    . nystatin cream (MYCOSTATIN) Mix with zinc oxide and apply to sacrum daily & prn    . olopatadine (PATANOL) 0.1 % ophthalmic solution Place 1 drop into both eyes 2 (two) times daily.    [name] omega-3 acid ethyl esters (LOVAZA) 1 g capsule Take 2 g by mouth 2 (two) times daily.    [name] oxyCODONE (OXY IR/ROXICODONE) 5 MG immediate release tablet Take 5 mg by mouth every 12 (twelve) hours as needed for severe pain.    . polyethylene glycol powder (MIRALAX) powder Take 17 g by mouth 2 (two) times daily.    . predniSONE (DELTASONE) 10 MG tablet Take 1 tablet by mouth daily X 7 days ( START DAY 08/03/16, STOP DATE 08/09/16)    . predniSONE (DELTASONE) 5 MG tablet Take 5 mg by mouth every morning. START ON 08/10/16    . pregabalin (LYRICA) 25 MG capsule Take 25 mg by mouth at bedtime.    . senna-docusate (SENOKOT-S) 8.6-50 MG tablet Take 2 tablets by mouth daily as needed for mild constipation.    . silver nitrate applicators [id number] % applicator May apply silver nitrate applicator stick to epithelialized rounded borders of thigh wound daily as needed to promote wound healing once daily    .  Tamsulosin HCl (FLOMAX) 0.4 MG CAPS Take 0.4 mg by mouth at bedtime.     . thiamine (VITAMIN B-1) 100 MG tablet Take 100 mg by mouth daily.    . traZODone (DESYREL) 50 MG tablet Take 50 mg by mouth at bedtime.    . vitamin C (ASCORBIC ACID) 500 MG tablet Take 500 mg by mouth daily.    . Zinc Oxide 10 % OINT Mix with Nystatin and apply to sacrum  daily & prn as ordered once a day    . zinc sulfate 220 (50 Zn) MG capsule Take 220 mg by mouth daily.     No current facility-administered medications on file prior to visit.      Review of Systems  Constitutional: Negative for appetite change, chills, fatigue and fever.  HENT: Negative.   Respiratory: Negative for cough, choking, chest tightness and shortness of breath.   Cardiovascular: Positive for leg swelling.  Gastrointestinal: Negative for abdominal distention, abdominal pain, blood in stool and constipation.  Neurological: Negative for dizziness and speech difficulty.  Psychiatric/Behavioral: Positive for confusion.    Vitals:   08/09/16 1427  BP: 101/68  Pulse: 95  Resp: 20  Temp: 99.5 F (37.5 C)  TempSrc: Oral   There is no height or weight on file to calculate BMI. Physical Exam  Constitutional: He is oriented to person, place, and time. He appears well-developed.  HENT:  Head: Normocephalic.  Neck: Neck supple.  Cardiovascular: Normal rate, regular rhythm and normal heart sounds.   Pulmonary/Chest: Breath sounds normal. He has no wheezes. He has no rales.  Abdominal: He exhibits no distension. There is no tenderness. There is no rebound.  Musculoskeletal: He exhibits edema.  B/L Edema  Neurological: He is oriented to person, place, and time.  Psychiatric: His behavior is normal.    Labs Reviewed   Assessment/Plan  1.ESRD on dialysis [facility])  Patient had acute on chronic failure in hospital. He is now on dialysis MWF. Patient is tolerating Dialysis well.   2.Anemia in chronic kidney disease Patients HGB was  7.6 on Mon and is 7.2 today. He is not having any chest pain but is feeling weak. Plan to Type and cross and transfuse 2 units tomorrow. It would be followed by dialysis.   3.Essential hypertension BP is well controlled.  4 DM (diabetes mellitus), type 2 with neurological complications ([place]) I could not find the HBA1c Was 7.4 in 02/17 Will repeat with next blood draw. Continue acu checks and insulin.  5.Rheumatoid arthritis involving multiple sites, unspecified rheumatoid factor presence ([place]) Patient is on plaquenil.   6.Hyperlipidemia LDL was 63 in 06/17. Continue Lipitor.   Coronary artery disease involving native coronary artery of native heart without angina pectoris        Family/ staff Communication:   Labs/tests ordered:

## 2016-08-10 ENCOUNTER — Encounter (HOSPITAL_COMMUNITY)
Admit: 2016-08-10 | Discharge: 2016-08-10 | Disposition: A | Discharge status: Home / Self Care | Payer: Medicare Other | Attending: Internal Medicine | Admitting: Internal Medicine

## 2016-08-10 DIAGNOSIS — D638 Anemia in other chronic diseases classified elsewhere: Secondary | ICD-10-CM | POA: Insufficient documentation

## 2016-08-10 DIAGNOSIS — N186 End stage renal disease: Secondary | ICD-10-CM | POA: Insufficient documentation

## 2016-08-10 LAB — HEMOGLOBIN AND HEMATOCRIT, BLOOD
HEMATOCRIT: 25 % — AB (ref 39.0–52.0)
HEMOGLOBIN: 7.9 g/dL — AB (ref 13.0–17.0)

## 2016-08-10 LAB — PREPARE RBC (CROSSMATCH)

## 2016-08-10 LAB — ABO/RH: ABO/RH(D): O POS

## 2016-08-11 ENCOUNTER — Encounter (HOSPITAL_COMMUNITY)
Admit: 2016-08-11 | Discharge: 2016-08-11 | Disposition: A | Discharge status: Home / Self Care | Payer: Medicare Other | Attending: Internal Medicine | Admitting: Internal Medicine

## 2016-08-11 DIAGNOSIS — D509 Iron deficiency anemia, unspecified: Secondary | ICD-10-CM | POA: Diagnosis not present

## 2016-08-11 DIAGNOSIS — Z992 Dependence on renal dialysis: Secondary | ICD-10-CM | POA: Diagnosis not present

## 2016-08-11 DIAGNOSIS — N2581 Secondary hyperparathyroidism of renal origin: Secondary | ICD-10-CM | POA: Diagnosis not present

## 2016-08-11 DIAGNOSIS — N186 End stage renal disease: Secondary | ICD-10-CM | POA: Diagnosis not present

## 2016-08-11 DIAGNOSIS — D631 Anemia in chronic kidney disease: Secondary | ICD-10-CM | POA: Diagnosis not present

## 2016-08-11 MED ORDER — SODIUM CHLORIDE 0.9 % IV SOLN: Freq: Once | INTRAVENOUS | Status: DC

## 2016-08-12 DIAGNOSIS — D509 Iron deficiency anemia, unspecified: Secondary | ICD-10-CM | POA: Diagnosis not present

## 2016-08-12 DIAGNOSIS — Z992 Dependence on renal dialysis: Secondary | ICD-10-CM | POA: Diagnosis not present

## 2016-08-12 DIAGNOSIS — N2581 Secondary hyperparathyroidism of renal origin: Secondary | ICD-10-CM | POA: Diagnosis not present

## 2016-08-12 DIAGNOSIS — N186 End stage renal disease: Secondary | ICD-10-CM | POA: Diagnosis not present

## 2016-08-12 DIAGNOSIS — D631 Anemia in chronic kidney disease: Secondary | ICD-10-CM | POA: Diagnosis not present

## 2016-08-12 LAB — TYPE AND SCREEN
ABO/RH(D): O POS
ANTIBODY SCREEN: NEGATIVE
UNIT DIVISION: 0
Unit division: 0

## 2016-08-15 DIAGNOSIS — N2581 Secondary hyperparathyroidism of renal origin: Secondary | ICD-10-CM | POA: Diagnosis not present

## 2016-08-15 DIAGNOSIS — D631 Anemia in chronic kidney disease: Secondary | ICD-10-CM | POA: Diagnosis not present

## 2016-08-15 DIAGNOSIS — N186 End stage renal disease: Secondary | ICD-10-CM | POA: Diagnosis not present

## 2016-08-15 DIAGNOSIS — Z992 Dependence on renal dialysis: Secondary | ICD-10-CM | POA: Diagnosis not present

## 2016-08-15 DIAGNOSIS — D509 Iron deficiency anemia, unspecified: Secondary | ICD-10-CM | POA: Diagnosis not present

## 2016-08-16 ENCOUNTER — Encounter: Payer: Self-pay | Admitting: Internal Medicine

## 2016-08-16 ENCOUNTER — Non-Acute Institutional Stay (SKILLED_NURSING_FACILITY): Payer: Medicare Other | Admitting: Internal Medicine

## 2016-08-16 DIAGNOSIS — Z89522 Acquired absence of left knee: Secondary | ICD-10-CM | POA: Diagnosis not present

## 2016-08-16 DIAGNOSIS — M069 Rheumatoid arthritis, unspecified: Secondary | ICD-10-CM

## 2016-08-16 DIAGNOSIS — D638 Anemia in other chronic diseases classified elsewhere: Secondary | ICD-10-CM

## 2016-08-16 DIAGNOSIS — G894 Chronic pain syndrome: Secondary | ICD-10-CM

## 2016-08-16 DIAGNOSIS — Z89512 Acquired absence of left leg below knee: Secondary | ICD-10-CM

## 2016-08-16 NOTE — Progress Notes (Signed)
Location:   [facility] Room Number: [id number] Place of Service:  SNF (31) Provider:  Nancy Marus, MD  Patient Care Team: Lala Lund, MD as PCP - General (Internal Medicine)  Extended Emergency Contact Information Primary Emergency Contact: Confer,Martha Address: [address]          [place], [place] [address] [place] Phone: [phone] Mobile Phone: [phone] Relation: Spouse Secondary Emergency Contact: [place] of [place] Phone: [phone] Relation: Daughter  Code Status:  Full Code Goals of care: Advanced Directive information Advanced Directives 08/16/2016  Does patient have an advance directive? Yes  Type of Advance Directive (No Data)  Does patient want to make changes to advanced directive? No - Patient declined  Copy of advanced directive(s) in chart? Yes  Would patient like information on creating an advanced directive? -  Pre-existing out of facility DNR order (yellow form or pink MOST form) -     Chief complaint-acute visit follow-up left below the knee amputation site  HPI:  Pt is a 65 y.o. male seen today for an acute visit for concerns of possible infection have his left below the knee amputation site.  He had a prolonged stay at [facility] secondary to ostium mellitus restarting insulin a below knee of dictation of his left leg.  He also has a history of acute on chronic renal failure and is now on dialysis.  Patient also had acute respiratory failure due to volume overload.  Patient is stable now he has done it appears fairly well here.  He did require a transfusion last week secondary to a hemoglobin of 7.2-that was trending down this is being followed by dialysis and will write an order to have the updated labs from dialysis to follow-up on this.  Currently he is resting in bed comfortably is a afebrile-he is concerned about the surgical site of his left  stump-apparently there was some drainage yesterday- He is not complaining of any fever or chills     Past Medical History:  Diagnosis Date  . Chronic back pain   . Chronic neck pain   . Coronary artery disease 1993   angioplasty after AMI  . Diabetes mellitus   . Diabetic neuropathy ([place])   . Hyperkalemia   . Hypertension   . Pancytopenia ([place]) 08/13/2014  . 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  . Rheumatoid arthritis(714.0)   . Seizure ([place]) 08/13/2014   Pt denies   Past Surgical History:  Procedure Laterality Date  . CARDIAC SURGERY    . FRACTURE SURGERY    . JOINT REPLACEMENT      Allergies  Allergen Reactions  . Sulfa Antibiotics Rash    Current Outpatient Prescriptions on File Prior to Visit  Medication Sig Dispense Refill  . acetaminophen (TYLENOL) 325 MG tablet Take 650 mg by mouth every 6 (six) hours as needed.    . Amino Acids-Protein Hydrolys (FEEDING SUPPLEMENT, PRO-STAT SUGAR FREE 64,) LIQD 30 ml by mouth twice a day between meals.    [name] amiodarone (PACERONE) 200 MG tablet Take 200 mg by mouth daily.    . Artificial Tear Ointment (REFRESH P.M. OP) Apply to eye. ONE APPLICATION BOTH EYES. For dry eyes not relieved by theratears every 6 hours    . aspirin 81 MG chewable tablet Chew 81 mg by mouth every morning.     [name] atorvastatin (LIPITOR) 80 MG tablet  Take 80 mg by mouth daily.    . Carboxymethylcellulose Sodium (THERATEARS) 0.25 % SOLN Apply 2 drops to both eyes three times a day    . diclofenac sodium (VOLTAREN) 1 % GEL Apply to site of pain four times a day as needed    . docusate sodium (COLACE) 100 MG capsule Take 100 mg by mouth 2 (two) times daily.    . hydroxychloroquine (PLAQUENIL) 200 MG tablet Take 200 mg by mouth daily.     [name] HYPROMELLOSE OP Apply to eye. (Artificial tears,)Apply one drop to each eye as needed for dry eyes.    . insulin NPH Human (HUMULIN N) 100 UNIT/ML injection Give 5 units  subcutaneous twice a day    . Melatonin 3 MG TABS Take 2 tablets by mouth at bedtime    . methadone (DOLOPHINE) 10 MG tablet Take 10 mg by mouth every 8 (eight) hours.    . metoprolol succinate (TOPROL-XL) 25 MG 24 hr tablet Take 25 mg by mouth every morning.    . Multiple Vitamins-Minerals (THERA-M ENHANCED PO) 1 tablet by mouth daily    . nystatin cream (MYCOSTATIN) Mix with zinc oxide and apply to sacrum daily & prn    . olopatadine (PATANOL) 0.1 % ophthalmic solution Place 1 drop into both eyes 2 (two) times daily.    [name] omega-3 acid ethyl esters (LOVAZA) 1 g capsule Take 2 g by mouth 2 (two) times daily.    [name] oxyCODONE (OXY IR/ROXICODONE) 5 MG immediate release tablet Take 5 mg by mouth every 12 (twelve) hours as needed for severe pain.    . polyethylene glycol powder (MIRALAX) powder Take 17 g by mouth 2 (two) times daily.    . predniSONE (DELTASONE) 5 MG tablet Take 5 mg by mouth every morning. START ON 08/10/16    . pregabalin (LYRICA) 25 MG capsule Take 25 mg by mouth at bedtime.    . senna-docusate (SENOKOT-S) 8.6-50 MG tablet Take 2 tablets by mouth daily as needed for mild constipation.    . silver nitrate applicators A999333 % applicator May apply silver nitrate applicator stick to epithelialized rounded borders of thigh wound daily as needed to promote wound healing once daily    . Tamsulosin HCl (FLOMAX) 0.4 MG CAPS Take 0.4 mg by mouth at bedtime.     . thiamine (VITAMIN B-1) 100 MG tablet Take 100 mg by mouth daily.    . traZODone (DESYREL) 50 MG tablet Take 50 mg by mouth at bedtime.    . vitamin C (ASCORBIC ACID) 500 MG tablet Take 500 mg by mouth daily.    . Zinc Oxide 10 % OINT Mix with Nystatin and apply to sacrum daily & prn as ordered once a day    . zinc sulfate 220 (50 Zn) MG capsule Take 220 mg by mouth daily.     No current facility-administered medications on file prior to visit.      Review of Systems   In general does not complain of any fever or chills --says he  felt kind of blah  earlier today but is feeling better now  Skin does have a history of a sacral ulcer which is followed by wound care. He is concerned about his left BKA  amputation site today  Head ears eyes nose mouth and throat does not clinically visual changes or sore throat.  Respiratory- not complaining of any shortness breath or cough currently.  Cardiac no chest pain has minimal lower extremity edema.  GI is not complaining of any nausea vomiting diarrhea constipation or abdominal discomfort.  GU is not complaining of dysuria.  Muscle skeletal is status post left BKA is not currently complaining of significant pain however has a history of chronic pain with rheumatoid arthritis.--Surgical site concerns as noted above  Neurologic is not complaining of dizziness headache or numbness currently.   psych appears to be in very good spirits does not complain of anxiety or depression  There is no immunization history for the selected administration types on file for this patient.  There is no immunization history for the selected administration types on file for this patient. Pertinent  Health Maintenance Due  Topic Date Due  . FOOT EXAM  09/05/1959  . OPHTHALMOLOGY EXAM  09/05/1959  . URINE MICROALBUMIN  09/05/1959  . COLONOSCOPY  09/05/1999  . HEMOGLOBIN A1C  06/11/2013  . PNA vac Low Risk Adult (1 of 2 - PCV13) 09/04/2014  . INFLUENZA VACCINE  11/25/2016 (Originally 07/26/2016)   No flowsheet data found. Functional Status Survey:    Vitals:   08/16/16 1301  BP: 130/61  Pulse: 69  Resp: 20  Temp: 97.6 F (36.4 C)  TempSrc: Oral  Weight: 193 lb 9.6 oz (87.8 kg)  Height: 5' 8.5" (1.74 m)   Body mass index is 29.01 kg/m. Physical Exam In general this is a very pleasant frail elderly male in no distress resting comfortably in bed.  His skin is warm and dry.--Left below-the-knee amputation site scar tissue is in place-I do not really see any drainage at  this point dressing was removed and this apparently has been on all day and is essentially dry there is some minimal erythema around the wound but this does not appear to be cellulitic it is not beefy red- also has a scabbed area a bit superior to  surgical site at appear somewhat similar with scar tissue and minimal non-concerning erythema.  There does not appear to be significantly tender  Sacral ulcer is currently covered this is followed by wound care.  Eyes pupils appear reactive to light sclera and conjunctiva clear visual acuity appears grossly intact.  Oropharynx clear mucous membranes moist.  Chest is clear to auscultation there is no labored breathing.  Heart is regular rate and rhythm without murmur gallop or rub he has trace lower extremity edema.  GU appears to be within normal limits.  Musculoskeletal he is status post left BKA stump is currently wrapped he is status post amputation of all toes except his right great toe on the right foot has flexion contractures arthritic changes of his hands bilaterally.   l.  Neurologic is grossly intact other than that appears to have some left upper extremity weakness which he describes as chronic cranial nerves appear to be intact his speech is clear Labs reviewed:  Recent Labs  12/04/15 1611 08/09/16 0730  NA 133* 135  K 5.5* 4.2  CL 96* 102  CO2 28 29  GLUCOSE 349* 91  BUN 29* 15  CREATININE 1.48* 1.37*  CALCIUM 8.7* 7.6*    Recent Labs  08/09/16 0730  AST 28  ALT 8*  ALKPHOS 320*  BILITOT 0.8  PROT 4.5*  ALBUMIN 1.8*    Recent Labs  08/09/16 0730 08/10/16 1040  WBC 5.0  --   NEUTROABS 2.2  --   HGB 7.2* 7.9*  HCT 23.2* 25.0*  MCV 105.0*  --   PLT 211  --    Lab Results  Component Value Date  TSH 2.050 08/13/2014   Lab Results  Component Value Date   HGBA1C 6.9 (H) 12/11/2012   No results found for: CHOL, HDL, LDLCALC, LDLDIRECT, TRIG, CHOLHDL  Significant Diagnostic Results in last  30 days:  No results found.  Assessment/Plan #1-surgical site concerns-the per note the knee amputation site at this point appears fairly benign with scar tissue in non-concerning erythema I do not see drainage it appears dry on exam-this will have to be watched however for any changes-I did assess this with the wound care nurse who thinks this actually has improved over the past few days however again this will have to be watched.  #2 anemia thought to be multifactorial per chart review-this is followed by dialysis apparently had received Epogen at some point.  Is status post transfusion last week I see updated hemoglobin was 7.9 on 08/10/2016-will write an order to obtain any recent labs from dialysis for follow-up.  #3 history of acute respiratory failure-with volume overload-at this point patient appears to be stable that regards does not complain of shortness breath or increased cough from baseline is resting comfortably in bed at this point continue to monitor He appears to be tolerating dialysis fairly well.  #4 history of chronic pain with history rheumatoid arthritis-he is now on a reduced dose of methadone 10 mg 3 times a day-also on oxycodone 5 mg every 12 hours when necessary-at this point appears to be stable he is not complaining of acute pain this afternoon He is also on prednisone 5 mg daily  [place], [place], [place]

## 2016-08-17 ENCOUNTER — Other Ambulatory Visit (HOSPITAL_COMMUNITY)
Admission: RE | Admit: 2016-08-17 | Discharge: 2016-08-17 | Disposition: A | Discharge status: Home / Self Care | Payer: Medicare Other | Source: Skilled Nursing Facility | Attending: Internal Medicine | Admitting: Internal Medicine

## 2016-08-17 DIAGNOSIS — N186 End stage renal disease: Secondary | ICD-10-CM | POA: Diagnosis not present

## 2016-08-17 DIAGNOSIS — Z992 Dependence on renal dialysis: Secondary | ICD-10-CM | POA: Diagnosis not present

## 2016-08-17 DIAGNOSIS — N2581 Secondary hyperparathyroidism of renal origin: Secondary | ICD-10-CM | POA: Diagnosis not present

## 2016-08-17 DIAGNOSIS — D509 Iron deficiency anemia, unspecified: Secondary | ICD-10-CM | POA: Diagnosis not present

## 2016-08-17 DIAGNOSIS — D631 Anemia in chronic kidney disease: Secondary | ICD-10-CM | POA: Diagnosis not present

## 2016-08-17 LAB — CBC WITH DIFFERENTIAL/PLATELET
BASOS ABS: 0 10*3/uL (ref 0.0–0.1)
Basophils Relative: 1 %
EOS ABS: 0.1 10*3/uL (ref 0.0–0.7)
EOS PCT: 1 %
HCT: 29.9 % — ABNORMAL LOW (ref 39.0–52.0)
Hemoglobin: 9.2 g/dL — ABNORMAL LOW (ref 13.0–17.0)
Lymphocytes Relative: 19 %
Lymphs Abs: 1.3 10*3/uL (ref 0.7–4.0)
MCH: 31.1 pg (ref 26.0–34.0)
MCHC: 30.8 g/dL (ref 30.0–36.0)
MCV: 101 fL — ABNORMAL HIGH (ref 78.0–100.0)
Monocytes Absolute: 0.4 10*3/uL (ref 0.1–1.0)
Monocytes Relative: 6 %
Neutro Abs: 5 10*3/uL (ref 1.7–7.7)
Neutrophils Relative %: 73 %
PLATELETS: 246 10*3/uL (ref 150–400)
RBC: 2.96 MIL/uL — AB (ref 4.22–5.81)
RDW: 17.1 % — ABNORMAL HIGH (ref 11.5–15.5)
WBC: 6.8 10*3/uL (ref 4.0–10.5)

## 2016-08-17 LAB — BASIC METABOLIC PANEL
Anion gap: 4 — ABNORMAL LOW (ref 5–15)
BUN: 24 mg/dL — AB (ref 6–20)
CALCIUM: 7.8 mg/dL — AB (ref 8.9–10.3)
CO2: 27 mmol/L (ref 22–32)
CREATININE: 1.74 mg/dL — AB (ref 0.61–1.24)
Chloride: 97 mmol/L — ABNORMAL LOW (ref 101–111)
GFR calc Af Amer: 45 mL/min — ABNORMAL LOW (ref >60)
GFR, EST NON AFRICAN AMERICAN: 39 mL/min — AB (ref >60)
Glucose, Bld: 83 mg/dL (ref 65–99)
Potassium: 5 mmol/L (ref 3.5–5.1)
SODIUM: 128 mmol/L — AB (ref 135–145)

## 2016-08-18 DIAGNOSIS — L97112 Non-pressure chronic ulcer of right thigh with fat layer exposed: Secondary | ICD-10-CM | POA: Diagnosis not present

## 2016-08-18 DIAGNOSIS — N186 End stage renal disease: Secondary | ICD-10-CM | POA: Diagnosis not present

## 2016-08-18 DIAGNOSIS — Z89512 Acquired absence of left leg below knee: Secondary | ICD-10-CM | POA: Diagnosis not present

## 2016-08-18 DIAGNOSIS — E11622 Type 2 diabetes mellitus with other skin ulcer: Secondary | ICD-10-CM | POA: Diagnosis not present

## 2016-08-19 DIAGNOSIS — N186 End stage renal disease: Secondary | ICD-10-CM | POA: Diagnosis not present

## 2016-08-19 DIAGNOSIS — N2581 Secondary hyperparathyroidism of renal origin: Secondary | ICD-10-CM | POA: Diagnosis not present

## 2016-08-19 DIAGNOSIS — Z992 Dependence on renal dialysis: Secondary | ICD-10-CM | POA: Diagnosis not present

## 2016-08-19 DIAGNOSIS — D509 Iron deficiency anemia, unspecified: Secondary | ICD-10-CM | POA: Diagnosis not present

## 2016-08-19 DIAGNOSIS — D631 Anemia in chronic kidney disease: Secondary | ICD-10-CM | POA: Diagnosis not present

## 2016-08-22 DIAGNOSIS — D509 Iron deficiency anemia, unspecified: Secondary | ICD-10-CM | POA: Diagnosis not present

## 2016-08-22 DIAGNOSIS — N186 End stage renal disease: Secondary | ICD-10-CM | POA: Diagnosis not present

## 2016-08-22 DIAGNOSIS — D631 Anemia in chronic kidney disease: Secondary | ICD-10-CM | POA: Diagnosis not present

## 2016-08-22 DIAGNOSIS — N2581 Secondary hyperparathyroidism of renal origin: Secondary | ICD-10-CM | POA: Diagnosis not present

## 2016-08-22 DIAGNOSIS — Z992 Dependence on renal dialysis: Secondary | ICD-10-CM | POA: Diagnosis not present

## 2016-08-24 DIAGNOSIS — D631 Anemia in chronic kidney disease: Secondary | ICD-10-CM | POA: Diagnosis not present

## 2016-08-24 DIAGNOSIS — D509 Iron deficiency anemia, unspecified: Secondary | ICD-10-CM | POA: Diagnosis not present

## 2016-08-24 DIAGNOSIS — N186 End stage renal disease: Secondary | ICD-10-CM | POA: Diagnosis not present

## 2016-08-24 DIAGNOSIS — Z992 Dependence on renal dialysis: Secondary | ICD-10-CM | POA: Diagnosis not present

## 2016-08-24 DIAGNOSIS — N2581 Secondary hyperparathyroidism of renal origin: Secondary | ICD-10-CM | POA: Diagnosis not present

## 2016-08-25 ENCOUNTER — Non-Acute Institutional Stay (SKILLED_NURSING_FACILITY): Payer: Medicare Other | Admitting: Internal Medicine

## 2016-08-25 DIAGNOSIS — Z992 Dependence on renal dialysis: Principal | ICD-10-CM

## 2016-08-25 DIAGNOSIS — R0602 Shortness of breath: Secondary | ICD-10-CM

## 2016-08-25 DIAGNOSIS — N186 End stage renal disease: Secondary | ICD-10-CM

## 2016-08-25 NOTE — Progress Notes (Signed)
Location:      Place of Service:    Provider:    Lala Lund, MD  Patient Care Team: Lala Lund, MD as PCP - General (Internal Medicine)  Extended Emergency Contact Information Primary Emergency Contact: Kise,Martha Address: [address]          [place], [place] [address] [place] Phone: [phone] Mobile Phone: [phone] Relation: Spouse Secondary Emergency Contact: [place] of [place] Phone: [phone] Relation: Daughter  Code Status: Full code Goals of care: Advanced Directive information Advanced Directives 08/16/2016  Does patient have an advance directive? Yes  Type of Advance Directive (No Data)  Does patient want to make changes to advanced directive? No - Patient declined  Copy of advanced directive(s) in chart? Yes  Would patient like information on creating an advanced directive? -  Pre-existing out of facility DNR order (yellow form or pink MOST form) -        HPI:  Pt is a 65 y.o. male seen today for an acute visit for  SOB. Patient has been C/O SOB for past few days. He says he has cough but some clear sputum. He says he feels better when he gets the breathing treatment but otherwise he stays SOB. No chest pain . No fever or chills. He thinks the SOB has worsened since he has been in facility. He is unable to do PT for long due to SOB. He is also having symptoms of Nocturnal dyspnea.   Past Medical History:  Diagnosis Date  . Chronic back pain   . Chronic neck pain   . Coronary artery disease 1993   angioplasty after AMI  . Diabetes mellitus   . Diabetic neuropathy ([place])   . Hyperkalemia   . Hypertension   . Pancytopenia ([place]) 08/13/2014  . 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  . Rheumatoid arthritis(714.0)   . Seizure ([place]) 08/13/2014   Pt denies   Past Surgical History:  Procedure Laterality Date  . CARDIAC SURGERY    . FRACTURE  SURGERY    . JOINT REPLACEMENT      Allergies  Allergen Reactions  . Sulfa Antibiotics Rash    Medications Current Outpatient Prescriptions on File Prior to Visit  Medication Sig Dispense Refill  . acetaminophen (TYLENOL) 325 MG tablet Take 650 mg by mouth every 6 (six) hours as needed.    [name] albuterol (PROVENTIL) (2.5 MG/3ML) 0.083% nebulizer solution Take 2.5 mg by nebulization every 4 (four) hours as needed for wheezing or shortness of breath.    . Amino Acids-Protein Hydrolys (FEEDING SUPPLEMENT, PRO-STAT SUGAR FREE 64,) LIQD 30 ml by mouth twice a day between meals.    [name] amiodarone (PACERONE) 200 MG tablet Take 200 mg by mouth daily.    . Artificial Tear Ointment (REFRESH P.M. OP) Apply to eye. ONE APPLICATION BOTH EYES. For dry eyes not relieved by theratears every 6 hours    . aspirin 81 MG chewable tablet Chew 81 mg by mouth every morning.     [name] atorvastatin (LIPITOR) 80 MG tablet Take 80 mg by mouth daily.    . Carboxymethylcellulose Sodium (THERATEARS) 0.25 % SOLN Apply 2 drops to both eyes three times a day    . diclofenac sodium (VOLTAREN) 1 % GEL Apply to site of pain four times a day as needed    . docusate sodium (COLACE) 100 MG capsule Take 100 mg by mouth 2 (two)  times daily.    . hydroxychloroquine (PLAQUENIL) 200 MG tablet Take 200 mg by mouth daily.     [name] HYPROMELLOSE OP Apply to eye. (Artificial tears,)Apply one drop to each eye as needed for dry eyes.    . insulin NPH Human (HUMULIN N) 100 UNIT/ML injection Give 5 units subcutaneous twice a day    . Melatonin 3 MG TABS Take 2 tablets by mouth at bedtime    . methadone (DOLOPHINE) 10 MG tablet Take 10 mg by mouth every 8 (eight) hours.    . metoprolol succinate (TOPROL-XL) 25 MG 24 hr tablet Take 25 mg by mouth every morning.    . Multiple Vitamins-Minerals (THERA-M ENHANCED PO) 1 tablet by mouth daily    . nystatin cream (MYCOSTATIN) Mix with zinc oxide and apply to sacrum daily & prn    . olopatadine (PATANOL) 0.1  % ophthalmic solution Place 1 drop into both eyes 2 (two) times daily.    [name] omega-3 acid ethyl esters (LOVAZA) 1 g capsule Take 2 g by mouth 2 (two) times daily.    [name] oxyCODONE (OXY IR/ROXICODONE) 5 MG immediate release tablet Take 5 mg by mouth every 12 (twelve) hours as needed for severe pain.    . OXYGEN O 2 at 2 LPM via n/c to keep O2 sat above 90 % every shift    . polyethylene glycol powder (MIRALAX) powder Take 17 g by mouth 2 (two) times daily.    . predniSONE (DELTASONE) 5 MG tablet Take 5 mg by mouth every morning. START ON 08/10/16    . pregabalin (LYRICA) 25 MG capsule Take 25 mg by mouth at bedtime.    . senna-docusate (SENOKOT-S) 8.6-50 MG tablet Take 2 tablets by mouth daily as needed for mild constipation.    . silver nitrate applicators A999333 % applicator May apply silver nitrate applicator stick to epithelialized rounded borders of thigh wound daily as needed to promote wound healing once daily    . Tamsulosin HCl (FLOMAX) 0.4 MG CAPS Take 0.4 mg by mouth at bedtime.     . thiamine (VITAMIN B-1) 100 MG tablet Take 100 mg by mouth daily.    . traZODone (DESYREL) 50 MG tablet Take 50 mg by mouth at bedtime.    . vitamin C (ASCORBIC ACID) 500 MG tablet Take 500 mg by mouth daily.    . Zinc Oxide 10 % OINT Mix with Nystatin and apply to sacrum daily & prn as ordered once a day    . zinc sulfate 220 (50 Zn) MG capsule Take 220 mg by mouth daily.     No current facility-administered medications on file prior to visit.         Review of Systems  Constitutional: Positive for fatigue. Negative for activity change, chills, fever and unexpected weight change.  Respiratory: Positive for cough, chest tightness, shortness of breath and wheezing.   Cardiovascular: Positive for leg swelling. Negative for chest pain and palpitations.  Gastrointestinal: Negative for abdominal distention, abdominal pain, blood in stool, constipation, diarrhea and nausea.  Neurological: Positive for  weakness.       Functional Status Survey:    There were no vitals filed for this visit. There is no height or weight on file to calculate BMI. Physical Exam  Constitutional: He appears well-developed.  HENT:  Head: Normocephalic.  Neck: Neck supple.  Cardiovascular: Normal rate and normal heart sounds.   Pulmonary/Chest: He has wheezes. He has rales.  Abdominal: He exhibits no distension. There  is no tenderness. There is no rebound.  Musculoskeletal: He exhibits edema.    Labs reviewed:  Recent Labs  12/04/15 1611 08/09/16 0730 08/17/16 1700  NA 133* 135 128*  K 5.5* 4.2 5.0  CL 96* 102 97*  CO2 28 29 27   GLUCOSE 349* 91 83  BUN 29* 15 24*  CREATININE 1.48* 1.37* 1.74*  CALCIUM 8.7* 7.6* 7.8*    Recent Labs  08/09/16 0730  AST 28  ALT 8*  ALKPHOS 320*  BILITOT 0.8  PROT 4.5*  ALBUMIN 1.8*    Recent Labs  08/09/16 0730 08/10/16 1040 08/17/16 1700  WBC 5.0  --  6.8  NEUTROABS 2.2  --  5.0  HGB 7.2* 7.9* 9.2*  HCT 23.2* 25.0* 29.9*  MCV 105.0*  --  101.0*  PLT 211  --  246   Lab Results  Component Value Date   TSH 2.050 08/13/2014   Lab Results  Component Value Date   HGBA1C 6.9 (H) 12/11/2012   No results found for: CHOL, HDL, LDLCALC, LDLDIRECT, TRIG, CHOLHDL  Significant Diagnostic Results in last 30 days:  No results found.  Assessment/Plan  Shortness of Breath  Patient does have edema and rales on exam. He has been going regularly for his dialysis. His POX is 95%. But I suspect that he has CHF. Will Get Xray.  He is schedule for Dialysis tomorrow and would need extra dialysis if patient ends up having Pulmonary edema. Will continue to monitor him closely right now

## 2016-08-26 ENCOUNTER — Encounter (HOSPITAL_COMMUNITY): Payer: Self-pay

## 2016-08-26 ENCOUNTER — Encounter: Payer: Self-pay | Admitting: Internal Medicine

## 2016-08-26 ENCOUNTER — Inpatient Hospital Stay (HOSPITAL_COMMUNITY)
Admission: EM | Admit: 2016-08-26 | Discharge: 2016-08-30 | DRG: 291 | Disposition: A | Discharge status: Skilled Nursing Facility | Payer: Medicare Other | Attending: Internal Medicine | Admitting: Internal Medicine

## 2016-08-26 ENCOUNTER — Emergency Department (HOSPITAL_COMMUNITY): Payer: Medicare Other

## 2016-08-26 DIAGNOSIS — Z89512 Acquired absence of left leg below knee: Secondary | ICD-10-CM

## 2016-08-26 DIAGNOSIS — I1311 Hypertensive heart and chronic kidney disease without heart failure, with stage 5 chronic kidney disease, or end stage renal disease: Secondary | ICD-10-CM | POA: Diagnosis not present

## 2016-08-26 DIAGNOSIS — D539 Nutritional anemia, unspecified: Secondary | ICD-10-CM | POA: Diagnosis not present

## 2016-08-26 DIAGNOSIS — L8990 Pressure ulcer of unspecified site, unspecified stage: Secondary | ICD-10-CM | POA: Diagnosis not present

## 2016-08-26 DIAGNOSIS — R262 Difficulty in walking, not elsewhere classified: Secondary | ICD-10-CM | POA: Diagnosis not present

## 2016-08-26 DIAGNOSIS — E875 Hyperkalemia: Secondary | ICD-10-CM | POA: Diagnosis not present

## 2016-08-26 DIAGNOSIS — M0609 Rheumatoid arthritis without rheumatoid factor, multiple sites: Secondary | ICD-10-CM | POA: Diagnosis not present

## 2016-08-26 DIAGNOSIS — I5023 Acute on chronic systolic (congestive) heart failure: Secondary | ICD-10-CM | POA: Diagnosis present

## 2016-08-26 DIAGNOSIS — Z992 Dependence on renal dialysis: Secondary | ICD-10-CM | POA: Diagnosis not present

## 2016-08-26 DIAGNOSIS — Z823 Family history of stroke: Secondary | ICD-10-CM

## 2016-08-26 DIAGNOSIS — Z794 Long term (current) use of insulin: Secondary | ICD-10-CM | POA: Diagnosis not present

## 2016-08-26 DIAGNOSIS — D638 Anemia in other chronic diseases classified elsewhere: Secondary | ICD-10-CM | POA: Diagnosis not present

## 2016-08-26 DIAGNOSIS — G8929 Other chronic pain: Secondary | ICD-10-CM | POA: Diagnosis not present

## 2016-08-26 DIAGNOSIS — M6281 Muscle weakness (generalized): Secondary | ICD-10-CM | POA: Diagnosis not present

## 2016-08-26 DIAGNOSIS — Z833 Family history of diabetes mellitus: Secondary | ICD-10-CM | POA: Diagnosis not present

## 2016-08-26 DIAGNOSIS — I5022 Chronic systolic (congestive) heart failure: Secondary | ICD-10-CM | POA: Diagnosis not present

## 2016-08-26 DIAGNOSIS — E1122 Type 2 diabetes mellitus with diabetic chronic kidney disease: Secondary | ICD-10-CM | POA: Diagnosis present

## 2016-08-26 DIAGNOSIS — G4709 Other insomnia: Secondary | ICD-10-CM | POA: Diagnosis not present

## 2016-08-26 DIAGNOSIS — M069 Rheumatoid arthritis, unspecified: Secondary | ICD-10-CM | POA: Diagnosis present

## 2016-08-26 DIAGNOSIS — R0602 Shortness of breath: Secondary | ICD-10-CM | POA: Diagnosis not present

## 2016-08-26 DIAGNOSIS — E871 Hypo-osmolality and hyponatremia: Secondary | ICD-10-CM | POA: Diagnosis not present

## 2016-08-26 DIAGNOSIS — I132 Hypertensive heart and chronic kidney disease with heart failure and with stage 5 chronic kidney disease, or end stage renal disease: Secondary | ICD-10-CM | POA: Diagnosis present

## 2016-08-26 DIAGNOSIS — Z79891 Long term (current) use of opiate analgesic: Secondary | ICD-10-CM

## 2016-08-26 DIAGNOSIS — E1149 Type 2 diabetes mellitus with other diabetic neurological complication: Secondary | ICD-10-CM | POA: Diagnosis not present

## 2016-08-26 DIAGNOSIS — I2510 Atherosclerotic heart disease of native coronary artery without angina pectoris: Secondary | ICD-10-CM | POA: Diagnosis present

## 2016-08-26 DIAGNOSIS — I5020 Unspecified systolic (congestive) heart failure: Secondary | ICD-10-CM | POA: Diagnosis not present

## 2016-08-26 DIAGNOSIS — R569 Unspecified convulsions: Secondary | ICD-10-CM | POA: Diagnosis not present

## 2016-08-26 DIAGNOSIS — G40909 Epilepsy, unspecified, not intractable, without status epilepticus: Secondary | ICD-10-CM | POA: Diagnosis present

## 2016-08-26 DIAGNOSIS — R0600 Dyspnea, unspecified: Secondary | ICD-10-CM

## 2016-08-26 DIAGNOSIS — Z7952 Long term (current) use of systemic steroids: Secondary | ICD-10-CM | POA: Diagnosis not present

## 2016-08-26 DIAGNOSIS — J9691 Respiratory failure, unspecified with hypoxia: Secondary | ICD-10-CM | POA: Diagnosis not present

## 2016-08-26 DIAGNOSIS — E785 Hyperlipidemia, unspecified: Secondary | ICD-10-CM | POA: Diagnosis not present

## 2016-08-26 DIAGNOSIS — G729 Myopathy, unspecified: Secondary | ICD-10-CM | POA: Diagnosis not present

## 2016-08-26 DIAGNOSIS — T7840XD Allergy, unspecified, subsequent encounter: Secondary | ICD-10-CM | POA: Diagnosis not present

## 2016-08-26 DIAGNOSIS — N186 End stage renal disease: Secondary | ICD-10-CM | POA: Diagnosis not present

## 2016-08-26 DIAGNOSIS — N401 Enlarged prostate with lower urinary tract symptoms: Secondary | ICD-10-CM | POA: Diagnosis not present

## 2016-08-26 DIAGNOSIS — Z7982 Long term (current) use of aspirin: Secondary | ICD-10-CM | POA: Diagnosis not present

## 2016-08-26 DIAGNOSIS — E1129 Type 2 diabetes mellitus with other diabetic kidney complication: Secondary | ICD-10-CM | POA: Diagnosis present

## 2016-08-26 DIAGNOSIS — D649 Anemia, unspecified: Secondary | ICD-10-CM

## 2016-08-26 DIAGNOSIS — R279 Unspecified lack of coordination: Secondary | ICD-10-CM | POA: Diagnosis not present

## 2016-08-26 DIAGNOSIS — E1121 Type 2 diabetes mellitus with diabetic nephropathy: Secondary | ICD-10-CM | POA: Diagnosis not present

## 2016-08-26 DIAGNOSIS — E876 Hypokalemia: Secondary | ICD-10-CM | POA: Diagnosis present

## 2016-08-26 DIAGNOSIS — R069 Unspecified abnormalities of breathing: Secondary | ICD-10-CM | POA: Diagnosis not present

## 2016-08-26 DIAGNOSIS — R05 Cough: Secondary | ICD-10-CM | POA: Diagnosis not present

## 2016-08-26 DIAGNOSIS — I509 Heart failure, unspecified: Secondary | ICD-10-CM | POA: Diagnosis not present

## 2016-08-26 DIAGNOSIS — N179 Acute kidney failure, unspecified: Secondary | ICD-10-CM | POA: Diagnosis not present

## 2016-08-26 DIAGNOSIS — R1312 Dysphagia, oropharyngeal phase: Secondary | ICD-10-CM | POA: Diagnosis not present

## 2016-08-26 DIAGNOSIS — Z8249 Family history of ischemic heart disease and other diseases of the circulatory system: Secondary | ICD-10-CM | POA: Diagnosis not present

## 2016-08-26 DIAGNOSIS — Z4781 Encounter for orthopedic aftercare following surgical amputation: Secondary | ICD-10-CM | POA: Diagnosis not present

## 2016-08-26 DIAGNOSIS — E119 Type 2 diabetes mellitus without complications: Secondary | ICD-10-CM

## 2016-08-26 DIAGNOSIS — I10 Essential (primary) hypertension: Secondary | ICD-10-CM | POA: Diagnosis not present

## 2016-08-26 DIAGNOSIS — I4581 Long QT syndrome: Secondary | ICD-10-CM | POA: Diagnosis not present

## 2016-08-26 DIAGNOSIS — R32 Unspecified urinary incontinence: Secondary | ICD-10-CM | POA: Diagnosis present

## 2016-08-26 DIAGNOSIS — R9431 Abnormal electrocardiogram [ECG] [EKG]: Secondary | ICD-10-CM | POA: Diagnosis present

## 2016-08-26 LAB — CBC WITH DIFFERENTIAL/PLATELET
Basophils Absolute: 0 10*3/uL (ref 0.0–0.1)
Basophils Relative: 0 %
EOS ABS: 0.3 10*3/uL (ref 0.0–0.7)
Eosinophils Relative: 3 %
HEMATOCRIT: 29.8 % — AB (ref 39.0–52.0)
HEMOGLOBIN: 9.3 g/dL — AB (ref 13.0–17.0)
LYMPHS ABS: 2.2 10*3/uL (ref 0.7–4.0)
Lymphocytes Relative: 28 %
MCH: 31.1 pg (ref 26.0–34.0)
MCHC: 31.2 g/dL (ref 30.0–36.0)
MCV: 99.7 fL (ref 78.0–100.0)
MONO ABS: 0.7 10*3/uL (ref 0.1–1.0)
MONOS PCT: 8 %
NEUTROS ABS: 4.8 10*3/uL (ref 1.7–7.7)
NEUTROS PCT: 61 %
Platelets: 222 10*3/uL (ref 150–400)
RBC: 2.99 MIL/uL — ABNORMAL LOW (ref 4.22–5.81)
RDW: 17.8 % — AB (ref 11.5–15.5)
WBC: 7.9 10*3/uL (ref 4.0–10.5)

## 2016-08-26 LAB — BLOOD GAS, ARTERIAL
Acid-Base Excess: 3.1 mmol/L — ABNORMAL HIGH (ref 0.0–2.0)
Bicarbonate: 26.4 mmol/L (ref 20.0–28.0)
DRAWN BY: 38235
O2 Content: 2 L/min
O2 Saturation: 93 %
PCO2 ART: 52.5 mmHg — AB (ref 32.0–48.0)
PH ART: 7.351 (ref 7.350–7.450)
TCO2: 15.2 mmol/L (ref 0–100)
pO2, Arterial: 66.4 mmHg — ABNORMAL LOW (ref 83.0–108.0)

## 2016-08-26 LAB — COMPREHENSIVE METABOLIC PANEL
ALBUMIN: 1.8 g/dL — AB (ref 3.5–5.0)
ALK PHOS: 191 U/L — AB (ref 38–126)
ALT: 14 U/L — AB (ref 17–63)
AST: 26 U/L (ref 15–41)
Anion gap: 4 — ABNORMAL LOW (ref 5–15)
BUN: 16 mg/dL (ref 6–20)
CALCIUM: 8 mg/dL — AB (ref 8.9–10.3)
CHLORIDE: 100 mmol/L — AB (ref 101–111)
CO2: 29 mmol/L (ref 22–32)
CREATININE: 1.36 mg/dL — AB (ref 0.61–1.24)
GFR calc non Af Amer: 53 mL/min — ABNORMAL LOW (ref >60)
GLUCOSE: 79 mg/dL (ref 65–99)
Potassium: 3.3 mmol/L — ABNORMAL LOW (ref 3.5–5.1)
SODIUM: 133 mmol/L — AB (ref 135–145)
Total Bilirubin: 0.7 mg/dL (ref 0.3–1.2)
Total Protein: 5 g/dL — ABNORMAL LOW (ref 6.5–8.1)

## 2016-08-26 LAB — URINALYSIS, ROUTINE W REFLEX MICROSCOPIC
BILIRUBIN URINE: NEGATIVE
Glucose, UA: NEGATIVE mg/dL
Ketones, ur: NEGATIVE mg/dL
NITRITE: NEGATIVE
PROTEIN: 30 mg/dL — AB
SPECIFIC GRAVITY, URINE: 1.02 (ref 1.005–1.030)
pH: 6 (ref 5.0–8.0)

## 2016-08-26 LAB — TROPONIN I: Troponin I: 0.04 ng/mL (ref <0.03)

## 2016-08-26 LAB — MRSA PCR SCREENING: MRSA BY PCR: NEGATIVE

## 2016-08-26 LAB — BRAIN NATRIURETIC PEPTIDE: B Natriuretic Peptide: >4500 pg/mL — ABNORMAL HIGH (ref 0.0–100.0)

## 2016-08-26 LAB — RENAL FUNCTION PANEL
ANION GAP: 7 (ref 5–15)
Albumin: 1.9 g/dL — ABNORMAL LOW (ref 3.5–5.0)
BUN: 16 mg/dL (ref 6–20)
CHLORIDE: 100 mmol/L — AB (ref 101–111)
CO2: 27 mmol/L (ref 22–32)
CREATININE: 1.34 mg/dL — AB (ref 0.61–1.24)
Calcium: 8.2 mg/dL — ABNORMAL LOW (ref 8.9–10.3)
GFR, EST NON AFRICAN AMERICAN: 54 mL/min — AB (ref >60)
Glucose, Bld: 75 mg/dL (ref 65–99)
POTASSIUM: 3.3 mmol/L — AB (ref 3.5–5.1)
Phosphorus: 3.6 mg/dL (ref 2.5–4.6)
Sodium: 134 mmol/L — ABNORMAL LOW (ref 135–145)

## 2016-08-26 LAB — URINE MICROSCOPIC-ADD ON
Bacteria, UA: NONE SEEN
SQUAMOUS EPITHELIAL / LPF: NONE SEEN

## 2016-08-26 LAB — MAGNESIUM: Magnesium: 2.5 mg/dL — ABNORMAL HIGH (ref 1.7–2.4)

## 2016-08-26 LAB — TSH: TSH: 2.421 u[IU]/mL (ref 0.350–4.500)

## 2016-08-26 MED ORDER — FUROSEMIDE 10 MG/ML IJ SOLN
100.0000 mg | Freq: Two times a day (BID) | INTRAVENOUS | Status: DC
  Administered 2016-08-26 – 2016-08-30 (×9): 100 mg via INTRAVENOUS
  Filled 2016-08-26 (×11): qty 10

## 2016-08-26 MED ORDER — POLYETHYLENE GLYCOL 3350 17 G PO PACK
17.0000 g | PACK | Freq: Two times a day (BID) | ORAL | Status: DC
Start: 2016-08-26 — End: 2016-08-30
  Administered 2016-08-26 – 2016-08-29 (×6): 17 g via ORAL
  Filled 2016-08-26 (×7): qty 1

## 2016-08-26 MED ORDER — ACETAMINOPHEN 325 MG PO TABS
650.0000 mg | ORAL_TABLET | Freq: Four times a day (QID) | ORAL | Status: DC | PRN
  Administered 2016-08-26 – 2016-08-29 (×4): 650 mg via ORAL
  Filled 2016-08-26 (×4): qty 2

## 2016-08-26 MED ORDER — OMEGA-3-ACID ETHYL ESTERS 1 G PO CAPS
2.0000 g | ORAL_CAPSULE | Freq: Two times a day (BID) | ORAL | Status: DC
  Administered 2016-08-26 – 2016-08-30 (×8): 2 g via ORAL
  Filled 2016-08-26 (×8): qty 2

## 2016-08-26 MED ORDER — HEPARIN SODIUM (PORCINE) 1000 UNIT/ML DIALYSIS
1000.0000 [IU] | INTRAMUSCULAR | Status: DC | PRN
  Administered 2016-08-26: 4100 [IU] via INTRAVENOUS_CENTRAL

## 2016-08-26 MED ORDER — SENNOSIDES-DOCUSATE SODIUM 8.6-50 MG PO TABS: 2.0000 | ORAL_TABLET | Freq: Every day | ORAL | Status: DC | PRN

## 2016-08-26 MED ORDER — ATORVASTATIN CALCIUM 40 MG PO TABS
80.0000 mg | ORAL_TABLET | Freq: Every day | ORAL | Status: DC
  Administered 2016-08-26 – 2016-08-30 (×5): 80 mg via ORAL
  Filled 2016-08-26 (×5): qty 2

## 2016-08-26 MED ORDER — SODIUM CHLORIDE 0.9 % IV SOLN: 100.0000 mL | INTRAVENOUS | Status: DC | PRN

## 2016-08-26 MED ORDER — HEPARIN SODIUM (PORCINE) 5000 UNIT/ML IJ SOLN
5000.0000 [IU] | Freq: Three times a day (TID) | INTRAMUSCULAR | Status: DC
  Administered 2016-08-26 – 2016-08-29 (×8): 5000 [IU] via SUBCUTANEOUS
  Filled 2016-08-26 (×9): qty 1

## 2016-08-26 MED ORDER — HYDROXYCHLOROQUINE SULFATE 200 MG PO TABS
200.0000 mg | ORAL_TABLET | Freq: Every day | ORAL | Status: DC
  Administered 2016-08-26 – 2016-08-30 (×5): 200 mg via ORAL
  Filled 2016-08-26 (×6): qty 1

## 2016-08-26 MED ORDER — LIDOCAINE-PRILOCAINE 2.5-2.5 % EX CREA: 1.0000 "application " | TOPICAL_CREAM | CUTANEOUS | Status: DC | PRN

## 2016-08-26 MED ORDER — VITAMIN C 500 MG PO TABS
500.0000 mg | ORAL_TABLET | Freq: Every day | ORAL | Status: DC
  Administered 2016-08-26 – 2016-08-30 (×5): 500 mg via ORAL
  Filled 2016-08-26 (×5): qty 1

## 2016-08-26 MED ORDER — METOPROLOL SUCCINATE ER 25 MG PO TB24
25.0000 mg | ORAL_TABLET | Freq: Every morning | ORAL | Status: DC
  Administered 2016-08-26 – 2016-08-30 (×5): 25 mg via ORAL
  Filled 2016-08-26 (×5): qty 1

## 2016-08-26 MED ORDER — DOCUSATE SODIUM 100 MG PO CAPS
100.0000 mg | ORAL_CAPSULE | Freq: Two times a day (BID) | ORAL | Status: DC
  Administered 2016-08-26 – 2016-08-29 (×6): 100 mg via ORAL
  Filled 2016-08-26 (×7): qty 1

## 2016-08-26 MED ORDER — PREDNISONE 2.5 MG PO TABS
7.5000 mg | ORAL_TABLET | Freq: Every day | ORAL | Status: DC
  Filled 2016-08-26 (×4): qty 1

## 2016-08-26 MED ORDER — SODIUM CHLORIDE 0.9% FLUSH
3.0000 mL | Freq: Two times a day (BID) | INTRAVENOUS | Status: DC
  Administered 2016-08-26 – 2016-08-30 (×6): 3 mL via INTRAVENOUS

## 2016-08-26 MED ORDER — LIDOCAINE HCL (PF) 1 % IJ SOLN: 5.0000 mL | INTRAMUSCULAR | Status: DC | PRN

## 2016-08-26 MED ORDER — EPOETIN ALFA 10000 UNIT/ML IJ SOLN
INTRAMUSCULAR | Status: AC
Start: 2016-08-26 — End: 2016-08-26
  Administered 2016-08-26: 10000 [IU] via INTRAVENOUS
  Filled 2016-08-26: qty 1

## 2016-08-26 MED ORDER — ALTEPLASE 2 MG IJ SOLR: 2.0000 mg | Freq: Once | INTRAMUSCULAR | Status: DC | PRN

## 2016-08-26 MED ORDER — HEPARIN SODIUM (PORCINE) 1000 UNIT/ML IJ SOLN
INTRAMUSCULAR | Status: AC
  Administered 2016-08-26: 4100 [IU] via INTRAVENOUS_CENTRAL
  Filled 2016-08-26: qty 7

## 2016-08-26 MED ORDER — PREGABALIN 25 MG PO CAPS
25.0000 mg | ORAL_CAPSULE | Freq: Every day | ORAL | Status: DC
  Administered 2016-08-26 – 2016-08-29 (×4): 25 mg via ORAL
  Filled 2016-08-26 (×4): qty 1

## 2016-08-26 MED ORDER — PRO-STAT SUGAR FREE PO LIQD
30.0000 mL | Freq: Three times a day (TID) | ORAL | Status: DC
  Administered 2016-08-26 – 2016-08-30 (×7): 30 mL via ORAL
  Filled 2016-08-26 (×7): qty 30

## 2016-08-26 MED ORDER — PREDNISONE 5 MG PO TABS
7.5000 mg | ORAL_TABLET | Freq: Every day | ORAL | Status: DC
  Administered 2016-08-26 – 2016-08-30 (×5): 7.5 mg via ORAL
  Filled 2016-08-26 (×5): qty 2

## 2016-08-26 MED ORDER — PREDNISONE 5 MG PO TABS
7.5000 mg | ORAL_TABLET | Freq: Every day | ORAL | Status: DC
  Filled 2016-08-26 (×3): qty 1.5

## 2016-08-26 MED ORDER — ASPIRIN 81 MG PO CHEW
81.0000 mg | CHEWABLE_TABLET | Freq: Every morning | ORAL | Status: DC
  Administered 2016-08-26 – 2016-08-30 (×5): 81 mg via ORAL
  Filled 2016-08-26 (×5): qty 1

## 2016-08-26 MED ORDER — PENTAFLUOROPROP-TETRAFLUOROETH EX AERO: 1.0000 "application " | INHALATION_SPRAY | CUTANEOUS | Status: DC | PRN

## 2016-08-26 MED ORDER — EPOETIN ALFA 10000 UNIT/ML IJ SOLN
10000.0000 [IU] | INTRAMUSCULAR | Status: DC
  Administered 2016-08-26: 10000 [IU] via INTRAVENOUS
  Filled 2016-08-26 (×3): qty 1

## 2016-08-26 MED ORDER — ALBUTEROL SULFATE (2.5 MG/3ML) 0.083% IN NEBU
2.5000 mg | INHALATION_SOLUTION | RESPIRATORY_TRACT | Status: DC | PRN
  Administered 2016-08-27 – 2016-08-29 (×6): 2.5 mg via RESPIRATORY_TRACT
  Filled 2016-08-26 (×6): qty 3

## 2016-08-26 MED ORDER — POLYETHYLENE GLYCOL 3350 17 GM/SCOOP PO POWD: 17.0000 g | Freq: Two times a day (BID) | ORAL | Status: DC

## 2016-08-26 MED ORDER — ARTIFICIAL TEARS OP OINT
TOPICAL_OINTMENT | OPHTHALMIC | Status: DC | PRN
Start: 2016-08-26 — End: 2016-08-30
  Filled 2016-08-26: qty 3.5

## 2016-08-26 NOTE — Clinical Social Work Note (Signed)
Clinical Social Work Assessment  Patient Details  Name: [name] MRN: [id number] Date of Birth: January 25, 1951  Date of referral:  08/26/16               Reason for consult:  Discharge Planning                Permission sought to share information with:    Permission granted to share information::     Name::        Agency::     Relationship::     Contact Information:     Housing/Transportation Living arrangements for the past 2 months:  [place] of Information:  Spouse Patient Interpreter Needed:  None Criminal Activity/Legal Involvement Pertinent to Current Situation/Hospitalization:  No - Comment as needed Significant Relationships:  Spouse Lives with:  Facility Resident Do you feel safe going back to the place where you live?  Yes Need for family participation in patient care:  Yes (Comment)  Care giving concerns:  None reported. Pt is resident at [facility]    Social Worker assessment / plan:  CSW met with pt's wife at bedside. Pt sleeping during assessment. Pt has been a resident at [facility] for about a month. Dialysis was started when pt was at [facility] prior to transfer to SNF. They are very hopeful that pt will be able to return home after rehab. Pt receives dialysis at [facility] on Monday, Wednesday, and Fridays. Per [name] at facility, pt is skilled level of care and okay to return. No FL2 needed. Agreeable to weekend admission if medically stable.   Employment status:  Retired Forensic scientist:  Medicare PT Recommendations:  Not assessed at this time [place] / Referral to community resources:  Other (Comment Required) (Return to [facility])  Patient/Family's Response to care:  [name] requests return to [facility] when medically stable.   Patient/Family's Understanding of and Emotional Response to Diagnosis, Current Treatment, and Prognosis:  [name] appears to be very knowledgeable of pt's medical history and admission diagnosis.   Emotional  Assessment Appearance:  Appears stated age Attitude/Demeanor/Rapport:  Unable to Assess Affect (typically observed):  Unable to Assess Orientation:    Alcohol / Substance use:  Not Applicable Psych involvement (Current and /or in the community):  No (Comment)  Discharge Needs  Concerns to be addressed:  Discharge Planning Concerns Readmission within the last 30 days:  No Current discharge risk:  None Barriers to Discharge:  Continued Medical Work up   [employer], [place] 08/26/2016, 12:15 PM [phone]

## 2016-08-26 NOTE — Procedures (Signed)
   HEMODIALYSIS TREATMENT NOTE:  3.5 hour heparin-free dialysis completed via left IJ tunneled catheter. Exit site unremarkable. Goal met: 2.6 liters removed without interruption in ultrafiltration.  All blood was returned. Hemodynamically stable throughout HD session. Report given to Janace Aris, RN.  Rockwell [place], RN, CDN

## 2016-08-26 NOTE — H&P (Signed)
History and Physical    [name] [id number] DOB: [birth year] DOA: 08/26/2016  PCP: Lala Lund, MD  Patient coming from: SNF.   Chief Complaint: SOB and bilateral leg edema.    HPI: [name] is an 65 y.o. male with hx of complex partial Sz, Prolonged QTc, ARF requiring dialysis, chronic hypotension, pancytopenia, DM, RA on chronic steroid and placquenil, hx of respiratory failure requiring intubation, presented to the ED with bilateral pedal edema and SOB. EValuation in the ER showed CXR with bilateral effusion and pulmonary edema.  His Cr was 1.4, K of 3.3, and Hb of 9.3 g per dL.  EDP spoke with Dr Chilton Greathouse, and hospitalist was asked to admit him for volume overload, requiring HD.  He has no CP, fever, chills, nausea, vomiting or confusion.   ED Course: Mostly diagnostic tests were performed.   Rewiew of Systems:  Constitutional: Negative for malaise, fever and chills. No significant weight loss or weight gain Eyes: Negative for eye pain, redness and discharge, diplopia, visual changes, or flashes of light. ENMT: Negative for ear pain, hoarseness, nasal congestion, sinus pressure and sore throat. No headaches; tinnitus, drooling, or problem swallowing. Cardiovascular: Negative for chest pain, palpitations, diaphoresis, dyspnea and peripheral edema. ; No orthopnea, PND Respiratory: Negative for cough, hemoptysis, wheezing and stridor. No pleuritic chestpain. Gastrointestinal: Negative for diarrhea, constipation,  melena, blood in stool, hematemesis, jaundice and rectal bleeding.    Genitourinary: Negative for frequency, dysuria, incontinence,flank pain and hematuria; Musculoskeletal: Negative for back pain and neck pain. Negative for swelling and trauma.;  Skin: . Negative for pruritus, rash, abrasions, bruising and skin lesion.; ulcerations Neuro: Negative for headache, lightheadedness and neck stiffness. Negative for weakness, altered level of consciousness , altered mental  status, extremity weakness, burning feet, involuntary movement, seizure and syncope.  Psych: negative for anxiety, depression, insomnia, tearfulness, panic attacks, hallucinations, paranoia, suicidal or homicidal ideation    Past Medical History:  Diagnosis Date  . Chronic back pain   . Chronic neck pain   . Coronary artery disease 1993   angioplasty after AMI  . Diabetes mellitus   . Diabetic neuropathy ([place])   . Hyperkalemia   . Hypertension   . Pancytopenia ([place]) 08/13/2014  . 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  . Rheumatoid arthritis(714.0)   . Seizure ([place]) 08/13/2014   Pt denies    Rewiew of Systems:  Constitutional: Negative for malaise, fever and chills. No significant weight loss or weight gain Eyes: Negative for eye pain, redness and discharge, diplopia, visual changes, or flashes of light. ENMT: Negative for ear pain, hoarseness, nasal congestion, sinus pressure and sore throat. No headaches; tinnitus, drooling, or problem swallowing. Cardiovascular: Negative for chest pain, palpitations, diaphoresis, dyspnea and peripheral edema. ; No orthopnea, PND Respiratory: Negative for cough, hemoptysis, wheezing and stridor. No pleuritic chestpain. Gastrointestinal: Negative for nausea, vomiting, diarrhea, constipation, abdominal pain, melena, blood in stool, hematemesis, jaundice and rectal bleeding.    Genitourinary: Negative for frequency, dysuria, incontinence,flank pain and hematuria; Musculoskeletal: Negative for back pain and neck pain. Negative for swelling and trauma.;  Skin: . Negative for pruritus, rash, abrasions, bruising and skin lesion.; ulcerations Neuro: Negative for headache, lightheadedness and neck stiffness. Negative for weakness, altered level of consciousness , altered mental status, extremity weakness, burning feet, involuntary movement, seizure and syncope.  Psych: negative for anxiety, depression, insomnia,  tearfulness, panic attacks, hallucinations, paranoia, suicidal or homicidal ideation   Past Surgical History:  Procedure Laterality Date  . CARDIAC SURGERY    . FRACTURE SURGERY    . JOINT REPLACEMENT       reports that he has never smoked. He has never used smokeless tobacco. He reports that he does not drink alcohol or use drugs.  Allergies  Allergen Reactions  . Sulfa Antibiotics Rash    Family History  Problem Relation Age of Onset  . Diabetes Father   . Dementia Father   . Heart attack Brother     multiple brothers with MIs  . Hypertension Mother   . Stroke Mother      Prior to Admission medications   Medication Sig Start Date End Date Taking? Authorizing Provider  acetaminophen (TYLENOL) 325 MG tablet Take 650 mg by mouth every 6 (six) hours as needed.    Historical Provider, MD  albuterol (PROVENTIL) (2.5 MG/3ML) 0.083% nebulizer solution Take 2.5 mg by nebulization every 4 (four) hours as needed for wheezing or shortness of breath.    Historical Provider, MD  Amino Acids-Protein Hydrolys (FEEDING SUPPLEMENT, PRO-STAT SUGAR FREE 64,) LIQD 30 ml by mouth twice a day between meals.    Historical Provider, MD  amiodarone (PACERONE) 200 MG tablet Take 200 mg by mouth daily.    Historical Provider, MD  Artificial Tear Ointment (REFRESH P.M. OP) Apply to eye. ONE APPLICATION BOTH EYES. For dry eyes not relieved by theratears every 6 hours    Historical Provider, MD  aspirin 81 MG chewable tablet Chew 81 mg by mouth every morning.     Historical Provider, MD  atorvastatin (LIPITOR) 80 MG tablet Take 80 mg by mouth daily.    Historical Provider, MD  Carboxymethylcellulose Sodium (THERATEARS) 0.25 % SOLN Apply 2 drops to both eyes three times a day    Historical Provider, MD  diclofenac sodium (VOLTAREN) 1 % GEL Apply to site of pain four times a day as needed    Historical Provider, MD  docusate sodium (COLACE) 100 MG capsule Take 100 mg by mouth 2 (two) times daily.     Historical Provider, MD  hydroxychloroquine (PLAQUENIL) 200 MG tablet Take 200 mg by mouth daily.     Historical Provider, MD  HYPROMELLOSE OP Apply to eye. (Artificial tears,)Apply one drop to each eye as needed for dry eyes.    Historical Provider, MD  insulin NPH Human (HUMULIN N) 100 UNIT/ML injection Give 5 units subcutaneous twice a day    Historical Provider, MD  Melatonin 3 MG TABS Take 2 tablets by mouth at bedtime    Historical Provider, MD  methadone (DOLOPHINE) 10 MG tablet Take 10 mg by mouth every 8 (eight) hours.    Historical Provider, MD  metoprolol succinate (TOPROL-XL) 25 MG 24 hr tablet Take 25 mg by mouth every morning.    Historical Provider, MD  Multiple Vitamins-Minerals (THERA-M ENHANCED PO) 1 tablet by mouth daily    Historical Provider, MD  nystatin cream (MYCOSTATIN) Mix with zinc oxide and apply to sacrum daily & prn    Historical Provider, MD  olopatadine (PATANOL) 0.1 % ophthalmic solution Place 1 drop into both eyes 2 (two) times daily.    Historical Provider, MD  omega-3 acid ethyl esters (LOVAZA) 1 g capsule Take 2 g by mouth 2 (two) times daily.    Historical Provider, MD  oxyCODONE (OXY IR/ROXICODONE) 5 MG immediate release tablet Take 5 mg by mouth every 12 (twelve) hours as needed for severe pain.    Historical Provider, MD  OXYGEN O  2 at 2 LPM via n/c to keep O2 sat above 90 % every shift    Historical Provider, MD  polyethylene glycol powder (MIRALAX) powder Take 17 g by mouth 2 (two) times daily. 08/14/14   Rexene Alberts, MD  predniSONE (DELTASONE) 5 MG tablet Take 5 mg by mouth every morning. START ON 08/10/16    Historical Provider, MD  pregabalin (LYRICA) 25 MG capsule Take 25 mg by mouth at bedtime.    Historical Provider, MD  senna-docusate (SENOKOT-S) 8.6-50 MG tablet Take 2 tablets by mouth daily as needed for mild constipation.    Historical Provider, MD  silver nitrate applicators [id number] % applicator May apply silver nitrate applicator stick to  epithelialized rounded borders of thigh wound daily as needed to promote wound healing once daily    Historical Provider, MD  Tamsulosin HCl (FLOMAX) 0.4 MG CAPS Take 0.4 mg by mouth at bedtime.     Historical Provider, MD  thiamine (VITAMIN B-1) 100 MG tablet Take 100 mg by mouth daily.    Historical Provider, MD  traZODone (DESYREL) 50 MG tablet Take 50 mg by mouth at bedtime.    Historical Provider, MD  vitamin C (ASCORBIC ACID) 500 MG tablet Take 500 mg by mouth daily.    Historical Provider, MD  Zinc Oxide 10 % OINT Mix with Nystatin and apply to sacrum daily & prn as ordered once a day    Historical Provider, MD  zinc sulfate 220 (50 Zn) MG capsule Take 220 mg by mouth daily.    Historical Provider, MD    Physical Exam: Vitals:   08/26/16 0600 08/26/16 0611 08/26/16 0630 08/26/16 0700  BP: (!) 110/50 (!) 110/50 112/55 122/57  Pulse: 63 62 61 64  Resp: 15 19 14 15   Temp:      TempSrc:      SpO2: 98% 98% 97% 98%  Weight:      Height:        Constitutional: NAD, calm, comfortable Vitals:   08/26/16 0600 08/26/16 0611 08/26/16 0630 08/26/16 0700  BP: (!) 110/50 (!) 110/50 112/55 122/57  Pulse: 63 62 61 64  Resp: 15 19 14 15   Temp:      TempSrc:      SpO2: 98% 98% 97% 98%  Weight:      Height:       Eyes: PERRL, lids and conjunctivae normal ENMT: Mucous membranes are moist. Posterior pharynx clear of any exudate or lesions.Normal dentition.  Neck: normal, supple, no masses, no thyromegaly Respiratory: clear to auscultation bilaterally, no wheezing, no crackles. Normal respiratory effort. No accessory muscle use.  Cardiovascular: Regular rate and rhythm, no murmurs / rubs / gallops. No extremity edema. 2+ pedal pulses. No carotid bruits.  Abdomen: no tenderness, no masses palpated. No hepatosplenomegaly. Bowel sounds positive.  Musculoskeletal: no clubbing / cyanosis. No joint deformity upper and lower extremities. Good ROM, no contractures. Normal muscle tone.  Skin: no  rashes, lesions, ulcers. No induration Neurologic: CN 2-12 grossly intact. Sensation intact, DTR normal. Strength 5/5 in all 4.  Psychiatric: Normal judgment and insight. Alert and oriented x 3. Normal mood.    Labs on Admission: I have personally reviewed following labs and imaging studies  CBC:  Recent Labs Lab 08/26/16 0509  WBC 7.9  NEUTROABS 4.8  HGB 9.3*  HCT 29.8*  MCV 99.7  PLT [id number]   Basic Metabolic Panel:  Recent Labs Lab 08/26/16 0509  NA 133*  K 3.3*  CL 100*  CO2  29  GLUCOSE 79  BUN 16  CREATININE 1.36*  CALCIUM 8.0*  MG 2.5*   GFR: Estimated Creatinine Clearance: 57.7 mL/min (by C-G formula based on SCr of 1.36 mg/dL). Liver Function Tests:  Recent Labs Lab 08/26/16 0509  AST 26  ALT 14*  ALKPHOS 191*  BILITOT 0.7  PROT 5.0*  ALBUMIN 1.8*   Cardiac Enzymes:  Recent Labs Lab 08/26/16 0509  TROPONINI 0.04*   Urine analysis:    Component Value Date/Time   COLORURINE YELLOW 08/12/2014 2358   APPEARANCEUR CLEAR 08/12/2014 2358   LABSPEC 1.020 08/12/2014 2358   PHURINE 7.0 08/12/2014 2358   GLUCOSEU NEGATIVE 08/12/2014 2358   HGBUR MODERATE (A) 08/12/2014 2358   BILIRUBINUR NEGATIVE 08/12/2014 2358   KETONESUR NEGATIVE 08/12/2014 2358   PROTEINUR NEGATIVE 08/12/2014 2358   UROBILINOGEN 0.2 08/12/2014 2358   NITRITE NEGATIVE 08/12/2014 2358   LEUKOCYTESUR NEGATIVE 08/12/2014 2358     Radiological Exams on Admission: Dg Chest Port 1 View  Result Date: 08/26/2016 CLINICAL DATA:  Nonproductive cough, chest pressure and worsening dyspnea for 4 days EXAM: PORTABLE CHEST 1 VIEW COMPARISON:  04/04/2016 FINDINGS: There are pleural effusions bilaterally, right greater than left. There is hazy central airspace opacity which may represent alveolar edema. Mild pulmonary vascular prominence. Mild cardiomegaly. There is a dual-lumen left-sided central line extending into the right atrium. Incidental findings include severe shoulder arthropathy  bilaterally. IMPRESSION: Congestive heart failure with alveolar edema and/or right pleural effusion. Electronically Signed   By: Andreas Newport M.D.   On: 08/26/2016 05:25    EKG: Independently reviewed.  Assessment/Plan Principal Problem:   ESRD needing dialysis ([place]) Active Problems:   Coronary artery disease   DM (diabetes mellitus), type 2 with neurological complications ([facility])   Hyperlipidemia   Seizure ([facility])   Prolonged QT interval   Systolic heart failure ([place])   PLAN:   ESRD on HD:  Nephrology consulted.  Dr Chilton Greathouse planned to aggressively dialyzing him.  In the past, his hypotension was limiting ultrafiltration.  Hopefully, his volume overload can be ultra-filtrated adequately being inpatient.  Hypotension:  We will avoid HTN meds, and give adequate steroid for possible adrenal insufficiency.  Prolonged QTc:  Avoid certain meds:  D/C methadone, D/C elavil.  Follow QTc and check Mag.  Possible Seizure:  Continue with meds.   DM:  Will give sensitive SSI.   DVT prophylaxis: SUBQ Code Status: FULL CODE.  Family Communication: Wife Disposition Plan: To SNF. Consults called: [place]. Admission status: Inpatient.    Frandy Basnett MD FACP. [facility]  If 7PM-7AM, please contact night-coverage [email or web address] Password [facility]  08/26/2016, 8:33 AM

## 2016-08-26 NOTE — ED Provider Notes (Signed)
[place] DEPT Provider Note   CSN: [id number] Arrival date & time: 08/26/16  0441  Time seen 04:46 on arrival   History   Chief Complaint Chief Complaint  Patient presents with  . Shortness of Breath    HPI [name] is a 65 y.o. male.  HPI patient reports he was admitted to [facility] for 2 months. 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 While he was hospitalized he had respiratory distress and was placed on a ventilator for about 5 days. His wife states he did not have pneumonia. He was started on nebulizers at that time and was discharged from the hospital about 3 weeks ago with oxygen by nasal cannula. He has never smoked or had any history of asthma. She states before this hospitalization he has never used the nebulizer or inhaler before. He states for the past 1-2 weeks he's been having some cough and has a white sputum production at times. His wife reports of the last 4 days it seems to be getting worse. She states he's having difficulty coughing up the mucus. They deny any fever or chest pain but he states his chest just feels tight. He states he's been having some wheezing. He also has been having some swelling of his extremities. He has had nausea after eating a few times in the past week but not the last day or so. He denies vomiting or diarrhea. He had a nebulizer treatment at the rehabilitation center where he has been since he was discharged from the hospital 3 weeks ago. Pt states he was at home before he was hospitalized and plans on going home when better.   Patient has end-stage renal disease and gets dialysis on Monday, Wednesday, and Friday (today). He goes to [facility], [place]   ? Dialysis doctor  Nursing Home physician Dr Veleta Miners PCP Dr Danella Sensing at [facility]  Past Medical History:  Diagnosis Date  . Chronic back pain   .  Chronic neck pain   . Coronary artery disease 1993   angioplasty after AMI  . Diabetes mellitus   . Diabetic neuropathy ([place])   . Hyperkalemia   . Hypertension   . Pancytopenia ([place]) 08/13/2014  . 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  . Rheumatoid arthritis(714.0)   . Seizure ([place]) 08/13/2014   Pt denies    Patient Active Problem List   Diagnosis Date Noted  . ESRD needing dialysis ([place]) 08/26/2016  . Acute hematogenous osteomyelitis of left foot ([place]) 08/09/2016  . Cellulitis and abscess of lower extremity 05/29/2016  . Anemia of chronic disease 04/27/2016  . Status post amputation of toe of left foot ([place]) 04/27/2016  . Systolic heart failure ([place]) 07/09/2015  . Bradycardia 08/14/2014  . Prolonged QT interval 08/14/2014  . Gait disorder 08/14/2014  . Seizure ([place]) 08/13/2014  . Pancytopenia ([place]) 08/13/2014  . ARF (acute renal failure) ([place]) 08/13/2014  . Altered mental status 06/18/2014  . Closed dislocation of metatarsophalangeal (joint) 03/26/2013  . DM (diabetes mellitus), type 2 with neurological complications ([place]) 123XX123  . Osteomyelitis of metatarsal ([place]) 12/11/2012  . Hyperlipidemia 12/11/2012  . Coronary artery disease   . Rheumatoid arthritis ([place])   . Hypertension     Past Surgical History:  Procedure Laterality Date  . CARDIAC SURGERY    .  FRACTURE SURGERY    . JOINT REPLACEMENT         Home Medications    Prior to Admission medications   Medication Sig Start Date End Date Taking? Authorizing Provider  acetaminophen (TYLENOL) 325 MG tablet Take 650 mg by mouth every 6 (six) hours as needed.    Historical Provider, MD  albuterol (PROVENTIL) (2.5 MG/3ML) 0.083% nebulizer solution Take 2.5 mg by nebulization every 4 (four) hours as needed for wheezing or shortness of breath.    Historical Provider, MD  Amino Acids-Protein Hydrolys (FEEDING SUPPLEMENT, PRO-STAT SUGAR FREE 64,) LIQD 30 ml by mouth  twice a day between meals.    Historical Provider, MD  amiodarone (PACERONE) 200 MG tablet Take 200 mg by mouth daily.    Historical Provider, MD  Artificial Tear Ointment (REFRESH P.M. OP) Apply to eye. ONE APPLICATION BOTH EYES. For dry eyes not relieved by theratears every 6 hours    Historical Provider, MD  aspirin 81 MG chewable tablet Chew 81 mg by mouth every morning.     Historical Provider, MD  atorvastatin (LIPITOR) 80 MG tablet Take 80 mg by mouth daily.    Historical Provider, MD  Carboxymethylcellulose Sodium (THERATEARS) 0.25 % SOLN Apply 2 drops to both eyes three times a day    Historical Provider, MD  diclofenac sodium (VOLTAREN) 1 % GEL Apply to site of pain four times a day as needed    Historical Provider, MD  docusate sodium (COLACE) 100 MG capsule Take 100 mg by mouth 2 (two) times daily.    Historical Provider, MD  hydroxychloroquine (PLAQUENIL) 200 MG tablet Take 200 mg by mouth daily.     Historical Provider, MD  HYPROMELLOSE OP Apply to eye. (Artificial tears,)Apply one drop to each eye as needed for dry eyes.    Historical Provider, MD  insulin NPH Human (HUMULIN N) 100 UNIT/ML injection Give 5 units subcutaneous twice a day    Historical Provider, MD  Melatonin 3 MG TABS Take 2 tablets by mouth at bedtime    Historical Provider, MD  methadone (DOLOPHINE) 10 MG tablet Take 10 mg by mouth every 8 (eight) hours.    Historical Provider, MD  metoprolol succinate (TOPROL-XL) 25 MG 24 hr tablet Take 25 mg by mouth every morning.    Historical Provider, MD  Multiple Vitamins-Minerals (THERA-M ENHANCED PO) 1 tablet by mouth daily    Historical Provider, MD  nystatin cream (MYCOSTATIN) Mix with zinc oxide and apply to sacrum daily & prn    Historical Provider, MD  olopatadine (PATANOL) 0.1 % ophthalmic solution Place 1 drop into both eyes 2 (two) times daily.    Historical Provider, MD  omega-3 acid ethyl esters (LOVAZA) 1 g capsule Take 2 g by mouth 2 (two) times daily.     Historical Provider, MD  oxyCODONE (OXY IR/ROXICODONE) 5 MG immediate release tablet Take 5 mg by mouth every 12 (twelve) hours as needed for severe pain.    Historical Provider, MD  OXYGEN O 2 at 2 LPM via n/c to keep O2 sat above 90 % every shift    Historical Provider, MD  polyethylene glycol powder (MIRALAX) powder Take 17 g by mouth 2 (two) times daily. 08/14/14   Rexene Alberts, MD  predniSONE (DELTASONE) 5 MG tablet Take 5 mg by mouth every morning. START ON 08/10/16    Historical Provider, MD  pregabalin (LYRICA) 25 MG capsule Take 25 mg by mouth at bedtime.    Historical Provider, MD  senna-docusate (SENOKOT-S) 8.6-50 MG tablet Take 2 tablets by mouth daily as needed for mild constipation.    Historical Provider, MD  silver nitrate applicators [id number] % applicator May apply silver nitrate applicator stick to epithelialized rounded borders of thigh wound daily as needed to promote wound healing once daily    Historical Provider, MD  Tamsulosin HCl (FLOMAX) 0.4 MG CAPS Take 0.4 mg by mouth at bedtime.     Historical Provider, MD  thiamine (VITAMIN B-1) 100 MG tablet Take 100 mg by mouth daily.    Historical Provider, MD  traZODone (DESYREL) 50 MG tablet Take 50 mg by mouth at bedtime.    Historical Provider, MD  vitamin C (ASCORBIC ACID) 500 MG tablet Take 500 mg by mouth daily.    Historical Provider, MD  Zinc Oxide 10 % OINT Mix with Nystatin and apply to sacrum daily & prn as ordered once a day    Historical Provider, MD  zinc sulfate 220 (50 Zn) MG capsule Take 220 mg by mouth daily.    Historical Provider, MD    Family History Family History  Problem Relation Age of Onset  . Diabetes Father   . Dementia Father   . Heart attack Brother     multiple brothers with MIs  . Hypertension Mother   . Stroke Mother     Social History Social History  Substance Use Topics  . Smoking status: Never Smoker  . Smokeless tobacco: Never Used  . Alcohol use No  in rehab facility Oxygen 2 lpm  Black    Allergies   Sulfa antibiotics   Review of Systems Review of Systems  All other systems reviewed and are negative.    Physical Exam Updated Vital Signs BP 132/63 (BP Location: Right Arm)   Pulse 68   Temp 97.7 F (36.5 C) (Oral)   Resp 17   Ht 5\' 8"  (1.727 m)   Wt 195 lb (88.5 kg)   SpO2 99%   BMI 29.65 kg/m   Vital signs normal    Physical Exam  Constitutional: He is oriented to person, place, and time. He appears well-developed and well-nourished.  Non-toxic appearance. He does not appear ill. No distress.  HENT:  Head: Normocephalic and atraumatic.  Right Ear: External ear normal.  Left Ear: External ear normal.  Nose: Nose normal. No mucosal edema or rhinorrhea.  Mouth/Throat: Oropharynx is clear and moist and mucous membranes are normal. No dental abscesses or uvula swelling.  Voice is weak and hoarse  Eyes: Conjunctivae and EOM are normal. Pupils are equal, round, and reactive to light.  Neck: Normal range of motion and full passive range of motion without pain. Neck supple.  Cardiovascular: Normal rate, regular rhythm and normal heart sounds.  Exam reveals no gallop and no friction rub.   No murmur heard. Pulmonary/Chest: Effort normal and breath sounds normal. No respiratory distress. He has no wheezes. He has no rhonchi. He has no rales. He exhibits no tenderness and no crepitus.  Abdominal: Soft. Normal appearance and bowel sounds are normal. He exhibits no distension. There is no tenderness. There is no rebound and no guarding.  Musculoskeletal: Normal range of motion. He exhibits no edema or tenderness.  He is s/p left BKA, he has pitting edem 1 + RLE  Neurological: He is alert and oriented to person, place, and time. He has normal strength. No cranial nerve deficit.  Skin: Skin is warm, dry and intact. No rash noted. No erythema. No pallor.  Psychiatric:  He has a normal mood and affect. His speech is normal and behavior is normal. His mood appears not  anxious.  Nursing note and vitals reviewed.    ED Treatments / Results  Labs (all labs ordered are listed, but only abnormal results are displayed) Results for orders placed or performed during the hospital encounter of 08/26/16  Comprehensive metabolic panel  Result Value Ref Range   Sodium 133 (L) 135 - 145 mmol/L   Potassium 3.3 (L) 3.5 - 5.1 mmol/L   Chloride 100 (L) 101 - 111 mmol/L   CO2 29 22 - 32 mmol/L   Glucose, Bld 79 65 - 99 mg/dL   BUN 16 6 - 20 mg/dL   Creatinine, Ser 1.36 (H) 0.61 - 1.24 mg/dL   Calcium 8.0 (L) 8.9 - 10.3 mg/dL   Total Protein 5.0 (L) 6.5 - 8.1 g/dL   Albumin 1.8 (L) 3.5 - 5.0 g/dL   AST 26 15 - 41 U/L   ALT 14 (L) 17 - 63 U/L   Alkaline Phosphatase 191 (H) 38 - 126 U/L   Total Bilirubin 0.7 0.3 - 1.2 mg/dL   GFR calc non Af Amer 53 (L) >60 mL/min   GFR calc Af Amer >60 >60 mL/min   Anion gap 4 (L) 5 - 15  CBC with Differential  Result Value Ref Range   WBC 7.9 4.0 - 10.5 K/uL   RBC 2.99 (L) 4.22 - 5.81 MIL/uL   Hemoglobin 9.3 (L) 13.0 - 17.0 g/dL   HCT 29.8 (L) 39.0 - 52.0 %   MCV 99.7 78.0 - 100.0 fL   MCH 31.1 26.0 - 34.0 pg   MCHC 31.2 30.0 - 36.0 g/dL   RDW 17.8 (H) 11.5 - 15.5 %   Platelets 222 150 - 400 K/uL   Neutrophils Relative % 61 %   Neutro Abs 4.8 1.7 - 7.7 K/uL   Lymphocytes Relative 28 %   Lymphs Abs 2.2 0.7 - 4.0 K/uL   Monocytes Relative 8 %   Monocytes Absolute 0.7 0.1 - 1.0 K/uL   Eosinophils Relative 3 %   Eosinophils Absolute 0.3 0.0 - 0.7 K/uL   Basophils Relative 0 %   Basophils Absolute 0.0 0.0 - 0.1 K/uL  Brain natriuretic peptide  Result Value Ref Range   B Natriuretic Peptide >4,500.0 (H) 0.0 - 100.0 pg/mL  Troponin I  Result Value Ref Range   Troponin I 0.04 (HH) <0.03 ng/mL  Blood gas, arterial ([facility] & [facility] ONLY)  Result Value Ref Range   O2 Content 2.0 L/min   Delivery systems NASAL CANNULA    pH, Arterial 7.351 7.350 - 7.450   pCO2 arterial 52.5 (H) 32.0 - 48.0 mmHg   pO2, Arterial 66.4 (L) 83.0 -  108.0 mmHg   Bicarbonate 26.4 20.0 - 28.0 mmol/L   TCO2 15.2 0 - 100 mmol/L   Acid-Base Excess 3.1 (H) 0.0 - 2.0 mmol/L   O2 Saturation 93.0 %   Collection site RIGHT RADIAL    Drawn by [phone]    Sample type ARTERIAL DRAW    Allens test (pass/fail) PASS PASS   Laboratory interpretation all normal except hypokalemia, anemia, very elevated BNP, + troponin     EKG  EKG Interpretation  Date/Time:  Friday August 26 2016 05:02:16 EDT Ventricular Rate:  64 PR Interval:    QRS Duration: 114 QT Interval:  582 QTC Calculation: 601 R Axis:   83 Text Interpretation:  Sinus rhythm Incomplete right bundle branch block Prolonged  QT interval No significant change since last tracing 14 Apr 2016 Confirmed by [facility]  MD-I, Phinehas Grounds ([id number]) on 08/26/2016 5:08:17 AM       Radiology Dg Chest Port 1 View  Result Date: 08/26/2016 CLINICAL DATA:  Nonproductive cough, chest pressure and worsening dyspnea for 4 days EXAM: PORTABLE CHEST 1 VIEW COMPARISON:  04/04/2016 FINDINGS: There are pleural effusions bilaterally, right greater than left. There is hazy central airspace opacity which may represent alveolar edema. Mild pulmonary vascular prominence. Mild cardiomegaly. There is a dual-lumen left-sided central line extending into the right atrium. Incidental findings include severe shoulder arthropathy bilaterally. IMPRESSION: Congestive heart failure with alveolar edema and/or right pleural effusion. Electronically Signed   By: Andreas Newport M.D.   On: 08/26/2016 05:25    Procedures Procedures (including critical care time)  Medications Ordered in ED Medications - No data to display   Initial Impression / Assessment and Plan / ED Course  I have reviewed the triage vital signs and the nursing notes.  Pertinent labs & imaging results that were available during my care of the patient were reviewed by me and considered in my medical decision making (see chart for details).  Clinical Course   CXR  ordered, labs ordered including ABG.   06:00 spoke to patient and wife about his xray showing CHF. Wife states his BP after dialysis is in the 90's. We discussed he is retaining fluid, may need admission for more frequent dialysis to remove some of his excess fluid.   06:43 AM spoke with Dr Lowanda Foster, nephrology, agrees patient should be admitted and do more frequent dialysis.   07:12 AM Dr Marin Comment, hospitalist, admit to tele, requests magnesium b/o prolonged QT, was 2.6 at [facility].  I tried to review his records from [facility]. I see where he was intubated on July 1 on the procedure note however they do not indicate why he was intubated. There is no pulmonary consult notes available to me. There is no discharge summary available. I did see where he had BNP in the 3000 range.   Final Clinical Impressions(s) / ED Diagnoses   Final diagnoses:  Congestive heart failure, unspecified congestive heart failure chronicity, unspecified congestive heart failure type ([place])  Dyspnea  ESRD needing dialysis ([place])  Anemia, unspecified   Plan admission  Rolland Porter, MD, Barbette Or, MD 08/26/16 [phone]

## 2016-08-26 NOTE — ED Triage Notes (Signed)
Pt is a resident of [facility], reportedly has been sob for 4 days, dry cough, chest pressure

## 2016-08-26 NOTE — Consult Note (Signed)
Reason for Consult:fluid overload and chronic renal failure Referring Physician: Dr. Crisoforo [name] is an 65 y.o. male.  HPI: [name] [name] who has history of coronary disease, diabetes, rheumatoid arthritis, history of acute kidney injury superimposed on chronic and started on hemodialysis when [name] was at [facility]. Presently [name] was sent from nursing home because of increased difficulty breathing and also leg swelling. [name] has been transferred to my service at couple of weeks ago. An attempt was made to remove fluid with dialysis however because of recurrent intradialytic hypotension the fluid removal was not adequate. Presently [name] states that [name] is making urine however because of urinary incontinence [name] doesn't know the amount. [name] denies any nausea or vomiting.  Past Medical History:  Diagnosis Date  . Chronic back pain   . Chronic neck pain   . Coronary artery disease 1993   angioplasty after AMI  . Diabetes mellitus   . Diabetic neuropathy ([place])   . Hyperkalemia   . Hypertension   . Pancytopenia ([place]) 08/13/2014  . 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  . Rheumatoid arthritis(714.0)   . Seizure ([place]) 08/13/2014   Pt denies    Past Surgical History:  Procedure Laterality Date  . CARDIAC SURGERY    . FRACTURE SURGERY    . JOINT REPLACEMENT      Family History  Problem Relation Age of Onset  . Diabetes Father   . Dementia Father   . Heart attack Brother     multiple brothers with MIs  . Hypertension Mother   . Stroke Mother     Social History:  reports that [name] has never smoked. [name] has never used smokeless tobacco. [name] reports that [name] does not drink alcohol or use drugs.  Allergies:  Allergies  Allergen Reactions  . Sulfa Antibiotics Rash    Medications: I have reviewed the [name]'s current medications.  Results for orders placed or performed during the hospital encounter of 08/26/16 (from the past  48 hour(s))  Comprehensive metabolic panel     Status: Abnormal   Collection Time: 08/26/16  5:09 AM  Result Value Ref Range   Sodium 133 (L) 135 - 145 mmol/L   Potassium 3.3 (L) 3.5 - 5.1 mmol/L   Chloride 100 (L) 101 - 111 mmol/L   CO2 29 22 - 32 mmol/L   Glucose, Bld 79 65 - 99 mg/dL   BUN 16 6 - 20 mg/dL   Creatinine, Ser 1.36 (H) 0.61 - 1.24 mg/dL   Calcium 8.0 (L) 8.9 - 10.3 mg/dL   Total Protein 5.0 (L) 6.5 - 8.1 g/dL   Albumin 1.8 (L) 3.5 - 5.0 g/dL   AST 26 15 - 41 U/L   ALT 14 (L) 17 - 63 U/L   Alkaline Phosphatase 191 (H) 38 - 126 U/L   Total Bilirubin 0.7 0.3 - 1.2 mg/dL   GFR calc non Af Amer 53 (L) >60 mL/min   GFR calc Af Amer >60 >60 mL/min    Comment: (NOTE) The eGFR has been calculated using the CKD EPI equation. This calculation has not been validated in all clinical situations. eGFR's persistently <60 mL/min signify possible Chronic Kidney Disease.    Anion gap 4 (L) 5 - 15  CBC with Differential     Status: Abnormal   Collection Time: 08/26/16  5:09 AM  Result Value Ref Range   WBC 7.9 4.0 - 10.5 K/uL   RBC  2.99 (L) 4.22 - 5.81 MIL/uL   Hemoglobin 9.3 (L) 13.0 - 17.0 g/dL   HCT 29.8 (L) 39.0 - 52.0 %   MCV 99.7 78.0 - 100.0 fL   MCH 31.1 26.0 - 34.0 pg   MCHC 31.2 30.0 - 36.0 g/dL   RDW 17.8 (H) 11.5 - 15.5 %   Platelets 222 150 - 400 K/uL   Neutrophils Relative % 61 %   Neutro Abs 4.8 1.7 - 7.7 K/uL   Lymphocytes Relative 28 %   Lymphs Abs 2.2 0.7 - 4.0 K/uL   Monocytes Relative 8 %   Monocytes Absolute 0.7 0.1 - 1.0 K/uL   Eosinophils Relative 3 %   Eosinophils Absolute 0.3 0.0 - 0.7 K/uL   Basophils Relative 0 %   Basophils Absolute 0.0 0.0 - 0.1 K/uL  Brain natriuretic peptide     Status: Abnormal   Collection Time: 08/26/16  5:09 AM  Result Value Ref Range   B Natriuretic Peptide >4,500.0 (H) 0.0 - 100.0 pg/mL  Troponin I     Status: Abnormal   Collection Time: 08/26/16  5:09 AM  Result Value Ref Range   Troponin I 0.04 (HH) <0.03  ng/mL    Comment: CRITICAL RESULT CALLED TO, READ BACK BY AND VERIFIED WITH: TURNER,C AT 6:10AM ON 08/26/16 BY FESTERMAN,C   Magnesium     Status: Abnormal   Collection Time: 08/26/16  5:09 AM  Result Value Ref Range   Magnesium 2.5 (H) 1.7 - 2.4 mg/dL  Blood gas, arterial ([facility] & [facility] ONLY)     Status: Abnormal   Collection Time: 08/26/16  5:53 AM  Result Value Ref Range   O2 Content 2.0 L/min   Delivery systems NASAL CANNULA    pH, Arterial 7.351 7.350 - 7.450   pCO2 arterial 52.5 (H) 32.0 - 48.0 mmHg   pO2, Arterial 66.4 (L) 83.0 - 108.0 mmHg   Bicarbonate 26.4 20.0 - 28.0 mmol/L   TCO2 15.2 0 - 100 mmol/L   Acid-Base Excess 3.1 (H) 0.0 - 2.0 mmol/L   O2 Saturation 93.0 %   Collection site RIGHT RADIAL    Drawn by [phone]    Sample type ARTERIAL DRAW    Allens test (pass/fail) PASS PASS    Dg Chest Port 1 View  Result Date: 08/26/2016 CLINICAL DATA:  Nonproductive cough, chest pressure and worsening dyspnea for 4 days EXAM: PORTABLE CHEST 1 VIEW COMPARISON:  04/04/2016 FINDINGS: There are pleural effusions bilaterally, right greater than left. There is hazy central airspace opacity which may represent alveolar edema. Mild pulmonary vascular prominence. Mild cardiomegaly. There is a dual-lumen left-sided central line extending into the right atrium. Incidental findings include severe shoulder arthropathy bilaterally. IMPRESSION: Congestive heart failure with alveolar edema and/or right pleural effusion. Electronically Signed   By: Andreas Newport M.D.   On: 08/26/2016 05:25    Review of Systems  Constitutional: Negative for chills and fever.  HENT: Positive for congestion.   Respiratory: Positive for cough, shortness of breath and wheezing. Negative for sputum production.   Cardiovascular: Positive for orthopnea. Negative for chest pain and palpitations.  Gastrointestinal: Negative for nausea and vomiting.  Musculoskeletal: Positive for back pain.  Neurological: Positive for  weakness.   Blood pressure 109/55, pulse 64, temperature 97.7 F (36.5 C), temperature source Oral, resp. rate 20, height 5' 8" (1.727 m), weight 88.5 kg (195 lb), SpO2 98 %. Physical Exam  Constitutional: [name] is oriented to person, place, and time. No distress.  Eyes:  No scleral icterus.  Neck: No JVD present.  Cardiovascular: Normal rate and regular rhythm.   Respiratory: No respiratory distress. [name] has wheezes.  GI: [name] exhibits no distension. There is tenderness. There is rebound.  Musculoskeletal: [name] exhibits edema.  Neurological: [name] is alert and oriented to person, place, and time.    Assessment/Plan: Problem #1 difficulty in breathing: Possibly a combination of fluid overload and pleural effusion. [name] also states that [name] has some cough and wheezing. Problem #2 acute kidney injury superimposed on chronic. Presently [name] is on dialysis. [name] doesn't have any uremic sinus symptoms. Problem #3 pleural effusion: Bilateral. Right greater than left. Etiology not clear  Problem #4 history of rheumatoid arthritis Problem #5 seizure disorder Problem #6 history of diabetes Problem #7 history of osteomyelitis of his left foot status post surgery Problem #8 prolonged QT interval Problem #9 anemia: His hemoglobin is below target goal. Plan: We'll dialyze [name] today and use low temperature dialysate for 31/2  hours and try to remove about 21/2 if sbp  blood pressure tolerates. We'll try to dialyze tomorrow also.  2] we'll start [name] on Lasix 100 mg IV twice a day 3] because of his urine incontinence we'll put Foley catheter and collect his urine to see if his renal function is improving. Presently his creatinine is somewhat low. 4] we'll check his renal panel and CBC in the morning 5] we'll use Epogen 10,000 units IV after each dialysis.  , S 08/26/2016, 8:58 AM     

## 2016-08-26 NOTE — ED Notes (Signed)
CRITICAL VALUE ALERT  Critical value received: troponin- 0.04  Date of notification:  08/26/16  Time of notification:  0614  Critical value read back:Yes.    Nurse who received alert:  Idelia Salm, RN  MD notified (1st page):  [phone]  Time of first page:  0614  MD notified (2nd page):  Time of second page:  Responding MD:  Dr Tomi Bamberger  Time MD responded:  [phone]

## 2016-08-27 DIAGNOSIS — L8990 Pressure ulcer of unspecified site, unspecified stage: Secondary | ICD-10-CM | POA: Insufficient documentation

## 2016-08-27 LAB — COMPREHENSIVE METABOLIC PANEL
ALT: 14 U/L — ABNORMAL LOW (ref 17–63)
AST: 25 U/L (ref 15–41)
Albumin: 1.8 g/dL — ABNORMAL LOW (ref 3.5–5.0)
Alkaline Phosphatase: 211 U/L — ABNORMAL HIGH (ref 38–126)
Anion gap: 4 — ABNORMAL LOW (ref 5–15)
BILIRUBIN TOTAL: 0.7 mg/dL (ref 0.3–1.2)
BUN: 13 mg/dL (ref 6–20)
CO2: 29 mmol/L (ref 22–32)
Calcium: 7.8 mg/dL — ABNORMAL LOW (ref 8.9–10.3)
Chloride: 99 mmol/L — ABNORMAL LOW (ref 101–111)
Creatinine, Ser: 1.25 mg/dL — ABNORMAL HIGH (ref 0.61–1.24)
GFR, EST NON AFRICAN AMERICAN: 58 mL/min — AB (ref >60)
Glucose, Bld: 140 mg/dL — ABNORMAL HIGH (ref 65–99)
POTASSIUM: 4.1 mmol/L (ref 3.5–5.1)
Sodium: 132 mmol/L — ABNORMAL LOW (ref 135–145)
TOTAL PROTEIN: 4.7 g/dL — AB (ref 6.5–8.1)

## 2016-08-27 LAB — CBC
HEMATOCRIT: 29.9 % — AB (ref 39.0–52.0)
Hemoglobin: 9.3 g/dL — ABNORMAL LOW (ref 13.0–17.0)
MCH: 30.9 pg (ref 26.0–34.0)
MCHC: 31.1 g/dL (ref 30.0–36.0)
MCV: 99.3 fL (ref 78.0–100.0)
PLATELETS: 197 10*3/uL (ref 150–400)
RBC: 3.01 MIL/uL — AB (ref 4.22–5.81)
RDW: 17.7 % — AB (ref 11.5–15.5)
WBC: 7.1 10*3/uL (ref 4.0–10.5)

## 2016-08-27 LAB — RENAL FUNCTION PANEL
ALBUMIN: 1.8 g/dL — AB (ref 3.5–5.0)
Anion gap: 4 — ABNORMAL LOW (ref 5–15)
BUN: 14 mg/dL (ref 6–20)
CO2: 30 mmol/L (ref 22–32)
CREATININE: 1.28 mg/dL — AB (ref 0.61–1.24)
Calcium: 7.9 mg/dL — ABNORMAL LOW (ref 8.9–10.3)
Chloride: 98 mmol/L — ABNORMAL LOW (ref 101–111)
GFR calc Af Amer: >60 mL/min (ref >60)
GFR, EST NON AFRICAN AMERICAN: 57 mL/min — AB (ref >60)
Glucose, Bld: 139 mg/dL — ABNORMAL HIGH (ref 65–99)
PHOSPHORUS: 3.5 mg/dL (ref 2.5–4.6)
POTASSIUM: 4.1 mmol/L (ref 3.5–5.1)
Sodium: 132 mmol/L — ABNORMAL LOW (ref 135–145)

## 2016-08-27 LAB — GLUCOSE, CAPILLARY
GLUCOSE-CAPILLARY: 189 mg/dL — AB (ref 65–99)
Glucose-Capillary: 170 mg/dL — ABNORMAL HIGH (ref 65–99)

## 2016-08-27 LAB — CREATININE CLEARANCE, URINE, 24 HOUR
Collection Interval-CRCL: 24 hours
Creatinine Clearance: 7 mL/min — ABNORMAL LOW (ref 75–125)
Creatinine, 24H Ur: 121 mg/d — ABNORMAL LOW (ref 800–2000)
Creatinine, Urine: 15.11 mg/dL
Urine Total Volume-CRCL: 800 mL

## 2016-08-27 MED ORDER — INSULIN ASPART 100 UNIT/ML ~~LOC~~ SOLN
0.0000 [IU] | Freq: Three times a day (TID) | SUBCUTANEOUS | Status: DC
  Administered 2016-08-28 – 2016-08-29 (×5): 2 [IU] via SUBCUTANEOUS
  Administered 2016-08-29: 5 [IU] via SUBCUTANEOUS
  Administered 2016-08-30: 2 [IU] via SUBCUTANEOUS

## 2016-08-27 MED ORDER — TRAZODONE HCL 50 MG PO TABS
50.0000 mg | ORAL_TABLET | Freq: Once | ORAL | Status: AC
  Administered 2016-08-27: 50 mg via ORAL
  Filled 2016-08-27: qty 1

## 2016-08-27 MED ORDER — TRAZODONE HCL 50 MG PO TABS
50.0000 mg | ORAL_TABLET | Freq: Every day | ORAL | Status: DC
  Administered 2016-08-27 – 2016-08-29 (×3): 50 mg via ORAL
  Filled 2016-08-27 (×3): qty 1

## 2016-08-27 NOTE — Progress Notes (Signed)
Subjective: Interval History: has no complaint of difficulty in breathing. Patient at this moment feels much better..  Objective: Vital signs in last 24 hours: Temp:  [97.7 F (36.5 C)-98.3 F (36.8 C)] 98.3 F (36.8 C) (09/02 0601) Pulse Rate:  [58-71] 58 (09/02 0601) Resp:  [16-20] 20 (09/02 0601) BP: (110-147)/(51-74) 142/68 (09/02 0601) SpO2:  [92 %-99 %] 92 % (09/02 0601) Weight:  [87.8 kg (193 lb 9 oz)-88.2 kg (194 lb 6.4 oz)] 88.2 kg (194 lb 6.4 oz) (09/02 0601) Weight change: -0.651 kg (-1 lb 7 oz)  Intake/Output from previous day: 09/01 0701 - 09/02 0700 In: 643 [P.O.:580; I.V.:63] Out: 3500 [Urine:900] Intake/Output this shift: No intake/output data recorded.  General appearance: alert, cooperative and icteric Resp: diminished breath sounds bilaterally Cardio: regular rate and rhythm GI: soft, non-tender; bowel sounds normal; no masses,  no organomegaly Extremities: edema 1-2+ edema  Lab Results:  Recent Labs  08/26/16 0509 08/27/16 0630  WBC 7.9 7.1  HGB 9.3* 9.3*  HCT 29.8* 29.9*  PLT 222 197   BMET:  Recent Labs  08/26/16 0518 08/27/16 0630  NA 134* 132*  132*  K 3.3* 4.1  4.1  CL 100* 99*  98*  CO2 27 29  30   GLUCOSE 75 140*  139*  BUN 16 13  14   CREATININE 1.34* 1.25*  1.28*  CALCIUM 8.2* 7.8*  7.9*   No results for input(s): PTH in the last 72 hours. Iron Studies: No results for input(s): IRON, TIBC, TRANSFERRIN, FERRITIN in the last 72 hours.  Studies/Results: Dg Chest Port 1 View  Result Date: 08/26/2016 CLINICAL DATA:  Nonproductive cough, chest pressure and worsening dyspnea for 4 days EXAM: PORTABLE CHEST 1 VIEW COMPARISON:  04/04/2016 FINDINGS: There are pleural effusions bilaterally, right greater than left. There is hazy central airspace opacity which may represent alveolar edema. Mild pulmonary vascular prominence. Mild cardiomegaly. There is a dual-lumen left-sided central line extending into the right atrium. Incidental  findings include severe shoulder arthropathy bilaterally. IMPRESSION: Congestive heart failure with alveolar edema and/or right pleural effusion. Electronically Signed   By: Andreas Newport M.D.   On: 08/26/2016 05:25    I have reviewed the patient's current medications.  Assessment/Plan: Problem #1 difficulty breathing: Possibly combination of fluid overload and pleural effusion. Patient status post hemodialysis yesterday was 2600 mL of fluid removal. Patient also had 900 mL of urine output in total he is 3600 mL negative. Presently he seems to be feeling much better. Problem #2 hypokalemia: Potassium has corrected Problem #3 acute kidney injury superimposed on chronic. Presently patient on dialysis. His creatinine however seems to be very low. His renal function possibly may be recovering. Problem #4 pleural effusion Problem #5 anemia: His hemoglobin is low and is on Epogen. Problem #6 rheumatoid arthritis Plan: We'll hold dialysis today We'll continue with Lasix We'll check his renal panel in the morning. And we'll make a decision about his dialysis. His renal function improves will hold dialysis.    LOS: 1 day   Paulo Keimig S 08/27/2016,8:44 AM

## 2016-08-27 NOTE — Progress Notes (Signed)
PROGRESS NOTE    STEHEN DONATI  [id number] DOB: 09-29-1951 DOA: 08/26/2016 PCP: Lala Lund, MD    Brief Narrative: RYETT EASTER is an 65 y.o. male with hx of complex partial Sz, Prolonged QTc, ARF requiring dialysis, chronic hypotension, pancytopenia, DM, RA on chronic steroid and placquenil, hx of respiratory failure requiring intubation, presented to the ED with bilateral pedal edema and SOB. EValuation in the ER showed CXR with bilateral effusion and pulmonary edema.  His Cr was 1.4, K of 3.3, and Hb of 9.3 g per dL.  EDP spoke with Dr Chilton Greathouse, and hospitalist was asked to admit him for volume overload, requiring HD.  He has no CP, fever, chills, nausea, vomiting or confusion. He had dialysis yesterday, and is feeling better today.   Assessment & Plan:   Principal Problem:   ESRD needing dialysis ([place]) Active Problems:   Coronary artery disease   DM (diabetes mellitus), type 2 with neurological complications (HCC)   Hyperlipidemia   Seizure (HCC)   Prolonged QT interval   Systolic heart failure (HCC)   Pressure ulcer  ESRD on HD:  Nephrology consulted.  Dr Chilton Greathouse dialyzed him yesterday, removed 3.6 L, and diuresed him another 900cc.  He is feeling better.  In the past, his hypotension was limiting ultrafiltration.  Hopefully, his volume overload can be ultra-filtrated adequately being inpatient.  I planned to discharge him back to SNF Sunday if OK with renal.   Hypotension:  We will avoid HTN meds, and give adequate steroid for possible adrenal insufficiency.  Prolonged QTc:  Avoid certain meds:  D/C methadone, D/C elavil.  Follow QTc and check Mag.  Possible Seizure:  Continue with meds.   DM:  Will give sensitive SSI.  DVT prophylaxis: SUBQ Code Status: FULL CODE.  Family Communication: Wife Disposition Plan: To SNF. Consults called: [place]. Admission status: Inpatient.   Consultants:   Nephrology  Procedures:   Dialysis.    Antimicrobials: Anti-infectives    Start     Dose/Rate Route Frequency Ordered Stop   08/26/16 1215  hydroxychloroquine (PLAQUENIL) tablet 200 mg     200 mg Oral Daily 08/26/16 1209         Subjective: Feeling much better.  Less SOB.   Objective: Vitals:   08/26/16 1625 08/26/16 1630 08/26/16 2026 08/27/16 0601  BP: 140/69 (!) 142/70 (!) 139/54 (!) 142/68  Pulse: (!) 59 62 71 (!) 58  Resp:  16 20 20  Temp:  97.7 F (36.5 C) 97.9 F (36.6 C) 98.3 F (36.8 C)  TempSrc:  Oral Oral Oral  SpO2:   96% 92%  Weight:    88.2 kg (194 lb 6.4 oz)  Height:        Intake/Output Summary (Last 24 hours) at 08/27/16 0907 Last data filed at 08/27/16 0500  Gross per 24 hour  Intake              643 ml  Output             35 00 ml  Net            -2857 ml   Filed Weights   08/26/16 0458 08/26/16 1245 08/27/16 0601  Weight: 88.5 kg (195 lb) 87.8 kg (193 lb 9 oz) 88.2 kg (194 lb 6.4 oz)    Examination:  General exam: Appears calm and comfortable  Respiratory system: Clear to auscultation. Respiratory effort normal. Cardiovascular system: S1 & S2 heard, RRR. No JVD, murmurs, rubs, gallops or clicks.  No pedal edema. Gastrointestinal system: Abdomen is nondistended, soft and nontender. No organomegaly or masses felt. Normal bowel sounds heard. Central nervous system: Alert and oriented. No focal neurological deficits. Extremities: Symmetric 5 x 5 power. Skin: No rashes, lesions or ulcers Psychiatry: Judgement and insight appear normal. Mood & affect appropriate.   Data Reviewed: I have personally reviewed following labs and imaging studies  CBC:  Recent Labs Lab 08/26/16 0509 08/27/16 0630  WBC 7.9 7.1  NEUTROABS 4.8  --   HGB 9.3* 9.3*  HCT 29.8* 29.9*  MCV 99.7 99.3  PLT 222 [id number]   Basic Metabolic Panel:  Recent Labs Lab 08/26/16 0509 08/26/16 0518 08/27/16 0630  NA 133* 134* 132*  132*  K 3.3* 3.3* 4.1  4.1  CL 100* 100* 99*  98*  CO2 29 27 29  30   GLUCOSE  79 75 140*  139*  BUN 16 16 13  14   CREATININE 1.36* 1.34* 1.25*  1.28*  CALCIUM 8.0* 8.2* 7.8*  7.9*  MG 2.5*  --   --   PHOS  --  3.6 3.5   GFR: Estimated Creatinine Clearance: 62.7 mL/min (by C-G formula based on SCr of 1.25 mg/dL). Liver Function Tests:  Recent Labs Lab 08/26/16 0509 08/26/16 0518 08/27/16 0630  AST 26  --  25  ALT 14*  --  14*  ALKPHOS 191*  --  211*  BILITOT 0.7  --  0.7  PROT 5.0*  --  4.7*  ALBUMIN 1.8* 1.9* 1.8*  1.8*   Cardiac Enzymes:  Recent Labs Lab 08/26/16 0509  TROPONINI 0.04*   Thyroid Function Tests:  Recent Labs  08/26/16 0518  TSH 2.421     Recent Results (from the past 240 hour(s))  MRSA PCR Screening     Status: None   Collection Time: 08/26/16 12:05 PM  Result Value Ref Range Status   MRSA by PCR NEGATIVE NEGATIVE Final    Comment:        The GeneXpert MRSA Assay (FDA approved for NASAL specimens only), is one component of a comprehensive MRSA colonization surveillance program. It is not intended to diagnose MRSA infection nor to guide or monitor treatment for MRSA infections.      Radiology Studies: Dg Chest Port 1 View  Result Date: 08/26/2016 CLINICAL DATA:  Nonproductive cough, chest pressure and worsening dyspnea for 4 days EXAM: PORTABLE CHEST 1 VIEW COMPARISON:  04/04/2016 FINDINGS: There are pleural effusions bilaterally, right greater than left. There is hazy central airspace opacity which may represent alveolar edema. Mild pulmonary vascular prominence. Mild cardiomegaly. There is a dual-lumen left-sided central line extending into the right atrium. Incidental findings include severe shoulder arthropathy bilaterally. IMPRESSION: Congestive heart failure with alveolar edema and/or right pleural effusion. Electronically Signed   By: Andreas Newport M.D.   On: 08/26/2016 05:25    Scheduled Meds: . aspirin  81 mg Oral q morning - 10a  . atorvastatin  80 mg Oral Daily  . docusate sodium  100 mg Oral  BID  . epoetin (EPOGEN/PROCRIT) injection  10,000 Units Intravenous Q M,W,F-HD  . feeding supplement (PRO-STAT SUGAR FREE 64)  30 mL Oral TID BM  . furosemide  100 mg Intravenous BID  . heparin  5,000 Units Subcutaneous Q8H  . hydroxychloroquine  200 mg Oral Daily  . metoprolol succinate  25 mg Oral q morning - 10a  . omega-3 acid ethyl esters  2 g Oral BID  . polyethylene glycol  17 g Oral BID  .  predniSONE  7.5 mg Oral Q breakfast  . pregabalin  25 mg Oral QHS  . sodium chloride flush  3 mL Intravenous Q12H  . vitamin C  500 mg Oral Daily   Continuous Infusions:  None.    LOS: 1 day   Treshon Stannard, MD FACP Hospitalist.   If 7PM-7AM, please contact night-coverage [email or web address] Password [email or web address] 08/27/2016, 9:07 AM

## 2016-08-28 LAB — BASIC METABOLIC PANEL
ANION GAP: 6 (ref 5–15)
BUN: 22 mg/dL — ABNORMAL HIGH (ref 6–20)
CHLORIDE: 96 mmol/L — AB (ref 101–111)
CO2: 29 mmol/L (ref 22–32)
Calcium: 8.2 mg/dL — ABNORMAL LOW (ref 8.9–10.3)
Creatinine, Ser: 1.72 mg/dL — ABNORMAL HIGH (ref 0.61–1.24)
GFR calc non Af Amer: 40 mL/min — ABNORMAL LOW (ref >60)
GFR, EST AFRICAN AMERICAN: 46 mL/min — AB (ref >60)
Glucose, Bld: 167 mg/dL — ABNORMAL HIGH (ref 65–99)
POTASSIUM: 4.2 mmol/L (ref 3.5–5.1)
SODIUM: 131 mmol/L — AB (ref 135–145)

## 2016-08-28 LAB — GLUCOSE, CAPILLARY
GLUCOSE-CAPILLARY: 183 mg/dL — AB (ref 65–99)
GLUCOSE-CAPILLARY: 169 mg/dL — AB (ref 65–99)
Glucose-Capillary: 184 mg/dL — ABNORMAL HIGH (ref 65–99)
Glucose-Capillary: 245 mg/dL — ABNORMAL HIGH (ref 65–99)

## 2016-08-28 MED ORDER — PREDNISONE 2.5 MG PO TABS: 7.5000 mg | ORAL_TABLET | Freq: Every day | ORAL | 1 refills | Status: DC

## 2016-08-28 MED ORDER — ALBUTEROL SULFATE (2.5 MG/3ML) 0.083% IN NEBU
2.5000 mg | INHALATION_SOLUTION | Freq: Two times a day (BID) | RESPIRATORY_TRACT | Status: DC
  Administered 2016-08-28 – 2016-08-30 (×4): 2.5 mg via RESPIRATORY_TRACT
  Filled 2016-08-28 (×4): qty 3

## 2016-08-28 NOTE — Progress Notes (Signed)
PROGRESS NOTE    [name]  [id number] DOB: 1951-11-23 DOA: 08/26/2016 PCP: [name] Lund, MD    Brief Narrative: [name] Pettyis an 65 y.o.malewith hx of complex partial Sz, Prolonged QTc, ARF requiring dialysis, chronic hypotension, pancytopenia, DM, RA on chronic steroid and placquenil, hx of respiratory failure requiring intubation, presented to the ED with bilateral pedal edema and SOB. EValuation in the ER showed CXR with bilateral effusion and pulmonary edema. His Cr was 1.4, K of 3.3, and Hb of 9.3 g per dL. EDP spoke with [name], and hospitalist was asked to admit him for volume overload, requiring HD. He has no CP, fever, chills, nausea, vomiting or confusion. He had dialysis yesterday, and is feeling better today.    Assessment & Plan:   Principal Problem:   ESRD needing dialysis ([place]) Active Problems:   Coronary artery disease   DM (diabetes mellitus), type 2 with neurological complications (HCC)   Hyperlipidemia   Seizure (HCC)   Prolonged QT interval   Systolic heart failure (HCC)   Pressure ulcer   ESRD on HD: Nephrology consulted. [name] dialyzed him yesterday, removed 3.6 L, and diuresed him another 900cc.  He is feeling better. In the past, his hypotension was limiting ultrafiltration. Hopefully, his volume overload can be ultra-filtrated adequately being inpatient.  I planned to discharge him back to SNF Sunday if OK with renal, however, renal would like to see if he requires dialysis tomorrow, with Lasix.   Hypotension: We will avoid HTN meds, and give adequate steroid for possible adrenal insufficiency.  Prolonged QTc: Avoid certain meds: D/C methadone, D/C elavil. Follow QTc and check Mag.  Possible Seizure: Continue with meds.   DM: Will give sensitive SSI.   DVT prophylaxis:SUBQ Code Status:FULL CODE.  Family Communication:Wife Disposition Plan:To SNF. Consults called:[name]. Admission status:Inpatient.     Consultants:   Nephrology  Procedures:   Dialysis.   Antimicrobials:            Anti-infectives     Subjective:  Feeling OK.   Objective: Vitals:   08/27/16 2054 08/27/16 2245 08/28/16 0117 08/28/16 0617  BP:  126/66  (!) 126/57  Pulse:  73  72  Resp:  17  14  Temp:  97.9 F (36.6 C)  98.1 F (36.7 C)  TempSrc:  Oral  Oral  SpO2: 99% 100% 96% 100%  Weight:      Height:        Intake/Output Summary (Last 24 hours) at 08/28/16 1413 Last data filed at 08/28/16 0845  Gross per 24 hour  Intake              700 ml  Output              450 ml  Net              25 0 ml   Filed Weights   08/26/16 0458 08/26/16 1245 08/27/16 0601  Weight: 88.5 kg (195 lb) 87.8 kg (193 lb 9 oz) 88.2 kg (194 lb 6.4 oz)    Examination:  General exam: Appears calm and comfortable  Respiratory system: Clear to auscultation. Respiratory effort normal. Cardiovascular system: S1 & S2 heard, RRR. No JVD, murmurs, rubs, gallops or clicks. No pedal edema. Gastrointestinal system: Abdomen is nondistended, soft and nontender. No organomegaly or masses felt. Normal bowel sounds heard. Central nervous system: Alert and oriented. No focal neurological deficits. Extremities: Symmetric 5 x 5 power. Skin: No rashes, lesions or ulcers Psychiatry:  Judgement and insight appear normal. Mood & affect appropriate.   Data Reviewed: I have personally reviewed following labs and imaging studies  CBC:  Recent Labs Lab 08/26/16 0509 08/27/16 0630  WBC 7.9 7.1  NEUTROABS 4.8  --   HGB 9.3* 9.3*  HCT 29.8* 29.9*  MCV 99.7 99.3  PLT 222 [id number]   Basic Metabolic Panel:  Recent Labs Lab 08/26/16 0509 08/26/16 0518 08/27/16 0630 08/28/16 0937  NA 133* 134* 132*  132* 131*  K 3.3* 3.3* 4.1  4.1 4.2  CL 100* 100* 99*  98* 96*  CO2 29 27 29  30 29   GLUCOSE 79 75 140*  139* 167*  BUN 16 16 13  14  22*  CREATININE 1.36* 1.34* 1.25*  1.28* 1.72*  CALCIUM 8.0* 8.2* 7.8*  7.9* 8.2*  MG 2.5*   --   --   --   PHOS  --  3.6 3.5  --    GFR: Estimated Creatinine Clearance: 45.6 mL/min (by C-G formula based on SCr of 1.72 mg/dL). Liver Function Tests:  Recent Labs Lab 08/26/16 0509 08/26/16 0518 08/27/16 0630  AST 26  --  25  ALT 14*  --  14*  ALKPHOS 191*  --  211*  BILITOT 0.7  --  0.7  PROT 5.0*  --  4.7*  ALBUMIN 1.8* 1.9* 1.8*  1.8*    Recent Labs Lab 08/26/16 0509  TROPONINI 0.04*   CBG:  Recent Labs Lab 08/27/16 1717 08/27/16 2244 08/28/16 0741 08/28/16 1123  GLUCAP 189* 170* 184* 183*   Lipid Profile: Thyroid Function Tests:  Recent Labs  08/26/16 0518  TSH 2.421    Recent Results (from the past 240 hour(s))  MRSA PCR Screening     Status: None   Collection Time: 08/26/16 12:05 PM  Result Value Ref Range Status   MRSA by PCR NEGATIVE NEGATIVE Final    Comment:        The GeneXpert MRSA Assay (FDA approved for NASAL specimens only), is one component of a comprehensive MRSA colonization surveillance program. It is not intended to diagnose MRSA infection nor to guide or monitor treatment for MRSA infections.      Radiology Studies: No results found.  Scheduled Meds: . aspirin  81 mg Oral q morning - 10a  . atorvastatin  80 mg Oral Daily  . docusate sodium  100 mg Oral BID  . epoetin (EPOGEN/PROCRIT) injection  10,000 Units Intravenous Q M,W,F-HD  . feeding supplement (PRO-STAT SUGAR FREE 64)  30 mL Oral TID BM  . furosemide  100 mg Intravenous BID  . heparin  5,000 Units Subcutaneous Q8H  . hydroxychloroquine  200 mg Oral Daily  . insulin aspart  0-9 Units Subcutaneous TID WC  . metoprolol succinate  25 mg Oral q morning - 10a  . omega-3 acid ethyl esters  2 g Oral BID  . polyethylene glycol  17 g Oral BID  . predniSONE  7.5 mg Oral Q breakfast  . pregabalin  25 mg Oral QHS  . sodium chloride flush  3 mL Intravenous Q12H  . traZODone  50 mg Oral QHS  . vitamin C  500 mg Oral Daily   Continuous Infusions: None.    LOS: 2  days   [name] Ferch, MD [facility].   If 7PM-7AM, please contact night-coverage [email or web address] Password [email or web address] 08/28/2016, 2:13 PM

## 2016-08-28 NOTE — Clinical Social Work Note (Addendum)
CSW spoke with RN Rogue Jury at [facility]. Facility will be able to accept the patient back today. RN will call for report and assist with transferring the patient back to [place] when appropriate. Rogue Jury states that they can look up the patient's clinicals in EPIC and will wait for DC summary to be completed. CSW signing off.  Liz Beach MSW, [place], [place], [id number]

## 2016-08-28 NOTE — Progress Notes (Signed)
[name]  MRN: [id number]  DOB/AGE: 67-Apr-1952 67 y.o.  Primary Care Physician:JONAS, Archer Asa, MD  Admit date: 08/26/2016  Chief Complaint:  Chief Complaint  Patient presents with  . Shortness of Breath    S-Pt presented on  08/26/2016 with  Chief Complaint  Patient presents with  . Shortness of Breath  .     Pt today feels better.     Pt wife and daughter were present in the room.      They asked questions about dialysis .  Meds . aspirin  81 mg Oral q morning - 10a  . atorvastatin  80 mg Oral Daily  . docusate sodium  100 mg Oral BID  . epoetin (EPOGEN/PROCRIT) injection  10,000 Units Intravenous Q M,W,F-HD  . feeding supplement (PRO-STAT SUGAR FREE 64)  30 mL Oral TID BM  . furosemide  100 mg Intravenous BID  . heparin  5,000 Units Subcutaneous Q8H  . hydroxychloroquine  200 mg Oral Daily  . insulin aspart  0-9 Units Subcutaneous TID WC  . metoprolol succinate  25 mg Oral q morning - 10a  . omega-3 acid ethyl esters  2 g Oral BID  . polyethylene glycol  17 g Oral BID  . predniSONE  7.5 mg Oral Q breakfast  . pregabalin  25 mg Oral QHS  . sodium chloride flush  3 mL Intravenous Q12H  . traZODone  50 mg Oral QHS  . vitamin C  500 mg Oral Daily    Physical Exam: Vital signs in last 24 hours: Temp:  [97.9 F (36.6 C)-98.1 F (36.7 C)] 98.1 F (36.7 C) (09/03 0617) Pulse Rate:  [68-73] 72 (09/03 0617) Resp:  [14-20] 14 (09/03 0617) BP: (99-126)/(57-72) 126/57 (09/03 0617) SpO2:  [96 %-100 %] 100 % (09/03 0617) Weight change:     Intake/Output from previous day: 09/02 0701 - 09/03 0700 In: 920 [P.O.:720; IV Piggyback:200] Out: 450 [Urine:450] Total I/O In: 240 [P.O.:240] Out: -    Physical Exam: General- pt is awake,alert, oriented to time place and person Resp- No acute REsp distress, CTA B/L NO Rhonchi CVS- S1S2 regular in rate and rhythm GIT- BS+, soft, NT, ND EXT- 2+ LE Edema,No Cyanosis   Lab Results: CBC  Recent Labs  08/26/16 0509  08/27/16 0630  WBC 7.9 7.1  HGB 9.3* 9.3*  HCT 29.8* 29.9*  PLT 222 197    BMET  Recent Labs  08/27/16 0630 08/28/16 0937  NA 132*  132* 131*  K 4.1  4.1 4.2  CL 99*  98* 96*  CO2 29  30 29   GLUCOSE 140*  139* 167*  BUN 13  14 22*  CREATININE 1.25*  1.28* 1.72*  CALCIUM 7.8*  7.9* 8.2*    MICRO Recent Results (from the past 240 hour(s))  MRSA PCR Screening     Status: None   Collection Time: 08/26/16 12:05 PM  Result Value Ref Range Status   MRSA by PCR NEGATIVE NEGATIVE Final    Comment:        The GeneXpert MRSA Assay (FDA approved for NASAL specimens only), is one component of a comprehensive MRSA colonization surveillance program. It is not intended to diagnose MRSA infection nor to guide or monitor treatment for MRSA infections.       Lab Results  Component Value Date   CALCIUM 8.2 (L) 08/28/2016   PHOS 3.5 08/27/2016               Impression: 1)Renal  AKI secondary to ATN               AKI on CKD               CKD stage 3.               AKI requiring HD since admission at [facility]               Pt was last dialyzed yesterday  2)HTN  Medication- On Beta blockers    3)Anemia HGb stable   4)Rheumatoly-hx of RA On Hydroxychloroquine ans Steriods Primary MD following  5)CNS-hx of Seizure disorder Primary MD following  6)Electrolytes  Normokalemic  Hyponatremic   7)Acid base Co2 at goal     Plan:  No need for HD today Pt has ATN, his creat was low so the thought process was that he is recovering and may not need HD but Pt GFr decreased after HD 58=>43ml/min.Will follow BMET in am and then decide about need of HD.  I educated pt about his kidney issues and tried to answer their questions to best of my ability.      [name] 08/28/2016, 12:36 PM

## 2016-08-29 LAB — BASIC METABOLIC PANEL
Anion gap: 4 — ABNORMAL LOW (ref 5–15)
BUN: 27 mg/dL — ABNORMAL HIGH (ref 6–20)
CO2: 30 mmol/L (ref 22–32)
Calcium: 8.3 mg/dL — ABNORMAL LOW (ref 8.9–10.3)
Chloride: 96 mmol/L — ABNORMAL LOW (ref 101–111)
Creatinine, Ser: 1.84 mg/dL — ABNORMAL HIGH (ref 0.61–1.24)
GFR calc Af Amer: 42 mL/min — ABNORMAL LOW (ref >60)
GFR calc non Af Amer: 37 mL/min — ABNORMAL LOW (ref >60)
Glucose, Bld: 189 mg/dL — ABNORMAL HIGH (ref 65–99)
Potassium: 4.7 mmol/L (ref 3.5–5.1)
Sodium: 130 mmol/L — ABNORMAL LOW (ref 135–145)

## 2016-08-29 LAB — GLUCOSE, CAPILLARY
GLUCOSE-CAPILLARY: 167 mg/dL — AB (ref 65–99)
GLUCOSE-CAPILLARY: 152 mg/dL — AB (ref 65–99)
Glucose-Capillary: 172 mg/dL — ABNORMAL HIGH (ref 65–99)
Glucose-Capillary: 163 mg/dL — ABNORMAL HIGH (ref 65–99)

## 2016-08-29 LAB — PHOSPHORUS: Phosphorus: 4.4 mg/dL (ref 2.5–4.6)

## 2016-08-29 MED ORDER — OXYCODONE-ACETAMINOPHEN 5-325 MG PO TABS
1.0000 | ORAL_TABLET | Freq: Four times a day (QID) | ORAL | Status: DC | PRN
  Administered 2016-08-29: 1 via ORAL
  Filled 2016-08-29 (×2): qty 1

## 2016-08-29 MED ORDER — LORAZEPAM 0.5 MG PO TABS
0.5000 mg | ORAL_TABLET | Freq: Once | ORAL | Status: AC
  Administered 2016-08-29: 0.5 mg via ORAL
  Filled 2016-08-29: qty 1

## 2016-08-29 MED ORDER — LORAZEPAM 2 MG/ML IJ SOLN
1.0000 mg | Freq: Four times a day (QID) | INTRAMUSCULAR | Status: DC | PRN
  Administered 2016-08-30: 1 mg via INTRAVENOUS
  Filled 2016-08-29: qty 1

## 2016-08-29 NOTE — Progress Notes (Signed)
Late Entry 08/29/16 approx. 0800  Patient voiced that his pain was not being relieved by the Tylenol order.  He request to be replaced on his Methadone dose from home.  MD stated that he believes that the Methadone is what was making him feel bad and that he didn't want to restart the med at this time.  New orders were given.     EKG results were text paged the EKG results at 1450.    Went back in to reassess the patient and he was asleep and resting well after repositioning.

## 2016-08-29 NOTE — Progress Notes (Signed)
[name]  MRN: [id number]  DOB/AGE: 12-30-1950 65 y.o.  Primary Care Physician:JONAS, Archer Asa, MD  Admit date: 08/26/2016  Chief Complaint:  Chief Complaint  Patient presents with  . Shortness of Breath    S-Pt presented on  08/26/2016 with  Chief Complaint  Patient presents with  . Shortness of Breath  .     Pt offers no new complaints. Pt main concern is " Do I need dialysis today? "   Meds . albuterol  2.5 mg Nebulization BID  . aspirin  81 mg Oral q morning - 10a  . atorvastatin  80 mg Oral Daily  . docusate sodium  100 mg Oral BID  . epoetin (EPOGEN/PROCRIT) injection  10,000 Units Intravenous Q M,W,F-HD  . feeding supplement (PRO-STAT SUGAR FREE 64)  30 mL Oral TID BM  . furosemide  100 mg Intravenous BID  . heparin  5,000 Units Subcutaneous Q8H  . hydroxychloroquine  200 mg Oral Daily  . insulin aspart  0-9 Units Subcutaneous TID WC  . metoprolol succinate  25 mg Oral q morning - 10a  . omega-3 acid ethyl esters  2 g Oral BID  . polyethylene glycol  17 g Oral BID  . predniSONE  7.5 mg Oral Q breakfast  . pregabalin  25 mg Oral QHS  . sodium chloride flush  3 mL Intravenous Q12H  . traZODone  50 mg Oral QHS  . vitamin C  500 mg Oral Daily    Physical Exam: Vital signs in last 24 hours: Temp:  [97.6 F (36.4 C)-98.3 F (36.8 C)] 97.6 F (36.4 C) (09/04 0602) Pulse Rate:  [20-79] 76 (09/04 0602) Resp:  [20-82] 20 (09/04 0602) BP: (148-156)/(63-71) 148/63 (09/04 0602) SpO2:  [97 %-100 %] 97 % (09/04 0750) Weight change:  Last BM Date: 08/28/16  Intake/Output from previous day: 09/03 0701 - 09/04 0700 In: 580 [P.O.:480; IV Piggyback:100] Out: 1500 [Urine:1500] No intake/output data recorded.   Physical Exam: General- pt is awake,alert, oriented to time place and person Resp- No acute REsp distress, CTA B/L NO Rhonchi CVS- S1S2 regular in rate and rhythm GIT- BS+, soft, NT, ND EXT- 2+ LE Edema,No Cyanosis   Lab Results: CBC  Recent Labs  08/27/16 0630  WBC 7.1  HGB 9.3*  HCT 29.9*  PLT 197    BMET  Recent Labs  08/28/16 0937 08/29/16 0634  NA 131* 130*  K 4.2 4.7  CL 96* 96*  CO2 29 30  GLUCOSE 167* 189*  BUN 22* 27*  CREATININE 1.72* 1.84*  CALCIUM 8.2* 8.3*     MICRO Recent Results (from the past 240 hour(s))  MRSA PCR Screening     Status: None   Collection Time: 08/26/16 12:05 PM  Result Value Ref Range Status   MRSA by PCR NEGATIVE NEGATIVE Final    Comment:        The GeneXpert MRSA Assay (FDA approved for NASAL specimens only), is one component of a comprehensive MRSA colonization surveillance program. It is not intended to diagnose MRSA infection nor to guide or monitor treatment for MRSA infections.       Lab Results  Component Value Date   CALCIUM 8.3 (L) 08/29/2016   PHOS 4.4 08/29/2016        Impression: 1)Renal  AKI secondary to ATN               AKI on CKD  CKD stage 3.               AKI requiring HD since admission at [facility]               Pt was last dialyzed Friday(08/26/16).               Pt creat now holding at 1.7--1.8               Pt with adequate urine output               Will hold off to HD today     2)HTN  Medication- On Beta blockers    3)Anemia HGb stable   4)Rheumatoly-hx of RA On Hydroxychloroquine ans Steriods Primary MD following  5)CNS-hx of Seizure disorder Primary MD following  6)Electrolytes  Normokalemic  Hyponatremic    Hypervolemia Hyponatremia  7)Acid base Co2 at goal     Plan:  No need for HD today Will recheck BMET in am ,if creat stable at 1.7--1.8.will hold off HD If adequate urine out, will hold off on hd and then decide about need to d/c tunneled cath      Aariona Momon S 08/29/2016, 8:09 AM

## 2016-08-29 NOTE — Care Management Important Message (Signed)
Important Message  Patient Details  Name: [name] MRN: [id number] Date of Birth: 13-Apr-1951   Medicare Important Message Given:  Yes    Sherald Barge, RN 08/29/2016, 10:10 AM

## 2016-08-29 NOTE — Progress Notes (Signed)
Subjective:PROGRESS NOTE    [name]              [id number] DOB: Mar 13, 1951 DOA: 08/26/2016 PCP: Lala Lund, MD                         Brief Narrative: [name] Pettyis an 65 y.o.malewith hx of complex partial Sz, Prolonged QTc, ARF requiring dialysis, chronic hypotension, pancytopenia, DM, RA on chronic steroid and placquenil, hx of respiratory failure requiring intubation, presented to the ED with bilateral pedal edema and SOB. EValuation in the ER showed CXR with bilateral effusion and pulmonary edema. His Cr was 1.4, K of 3.3, and Hb of 9.3 g per dL. EDP spoke with Dr Chilton Greathouse, and hospitalist was asked to admit him for volume overload, requiring HD. He has no CP, fever, chills, nausea, vomiting or confusion. He had dialysis yesterday, and is feeling better today. He is requesting Methadone, but I would prefer that he not resume it, as it was causing him lethargy and weakness.     Assessment & Plan:   Principal Problem:   ESRD needing dialysis ([place]) Active Problems:   Coronary artery disease   DM (diabetes mellitus), type 2 with neurological complications (HCC)   Hyperlipidemia   Seizure (HCC)   Prolonged QT interval   Systolic heart failure (HCC)   Pressure ulcer   ESRD on HD: Nephrology consulted. Dr Chilton Greathouse dialyzed him yesterday, removed 3.6 L, and diuresed him another 900cc. He is feeling better. In the past, his hypotension was limiting ultrafiltration. Hopefully, his volume overload can be ultra-filtrated adequately being inpatient. I planned to discharge him back to SNF.  renal would like to see if he requires dialysis vs improve with IV Lasix.  His Cr is worsening.    Hypotension: We will avoid HTN meds, and give adequate steroid for possible adrenal insufficiency.  Prolonged QTc: Avoid certain meds: D/C methadone, D/C elavil. Follow QTc and check Mag. Repeat EKG today.   Possible Seizure: Continue with meds.   DM: Will give  sensitive SSI.   DVT prophylaxis:SUBQ Code Status:FULL CODE.  Family Communication:Wife Disposition Plan:To SNF. Consults called:[place]. Admission status:Inpatient.   Consultants:  Nephrology  Procedures: Dialysis.    Objective: Vitals:   08/29/16 0149 08/29/16 0602 08/29/16 0750 08/29/16 1305  BP:  (!) 148/63    Pulse:  76    Resp:  20    Temp:  97.6 F (36.4 C)    TempSrc:  Oral    SpO2: 98% 100% 97% 95%  Weight:      Height:        Intake/Output Summary (Last 24 hours) at 08/29/16 1426 Last data filed at 08/29/16 0500  Gross per 24 hour  Intake              240 ml  Output             1500 ml  Net            -1260 ml   Filed Weights   08/26/16 0458 08/26/16 1245 08/27/16 0601  Weight: 88.5 kg (195 lb) 87.8 kg (193 lb 9 oz) 88.2 kg (194 lb 6.4 oz)    Examination:  General exam: Appears calm and comfortable  Respiratory system: Clear to auscultation. Respiratory effort normal. Cardiovascular system: S1 & S2 heard, RRR. No JVD, murmurs, rubs, gallops or clicks. No pedal edema. Gastrointestinal system: Abdomen is nondistended, soft and nontender. No organomegaly or  masses felt. Normal bowel sounds heard. Central nervous system: Alert and oriented. No focal neurological deficits. Extremities: Symmetric 5 x 5 power. Skin: No rashes, lesions or ulcers Psychiatry: Judgement and insight appear normal. Mood & affect appropriate.   Data Reviewed: I have personally reviewed following labs and imaging studies  CBC:  Recent Labs Lab 08/26/16 0509 08/27/16 0630  WBC 7.9 7.1  NEUTROABS 4.8  --   HGB 9.3* 9.3*  HCT 29.8* 29.9*  MCV 99.7 99.3  PLT 222 [id number]   Basic Metabolic Panel:  Recent Labs Lab 08/26/16 0509 08/26/16 0518 08/27/16 0630 08/28/16 0937 08/29/16 0634  NA 133* 134* 132*  132* 131* 130*  K 3.3* 3.3* 4.1  4.1 4.2 4.7  CL 100* 100* 99*  98* 96* 96*  CO2 29 27 29  30 29 30   GLUCOSE 79 75 140*  139* 167* 189*  BUN 16 16  13  14  22* 27*  CREATININE 1.36* 1.34* 1.25*  1.28* 1.72* 1.84*  CALCIUM 8.0* 8.2* 7.8*  7.9* 8.2* 8.3*  MG 2.5*  --   --   --   --   PHOS  --  3.6 3.5  --  4.4   GFR: Estimated Creatinine Clearance: 42.6 mL/min (by C-G formula based on SCr of 1.84 mg/dL). Liver Function Tests:  Recent Labs Lab 08/26/16 0509 08/26/16 0518 08/27/16 0630  AST 26  --  25  ALT 14*  --  14*  ALKPHOS 191*  --  211*  BILITOT 0.7  --  0.7  PROT 5.0*  --  4.7*  ALBUMIN 1.8* 1.9* 1.8*  1.8*    Recent Labs Lab 08/26/16 0509  TROPONINI 0.04*   CBG:  Recent Labs Lab 08/28/16 1123 08/28/16 1605 08/28/16 2130 08/29/16 0741 08/29/16 1120  GLUCAP 183* 169* 245* 167* 152*    Recent Results (from the past 240 hour(s))  MRSA PCR Screening     Status: None   Collection Time: 08/26/16 12:05 PM  Result Value Ref Range Status   MRSA by PCR NEGATIVE NEGATIVE Final    Comment:        The GeneXpert MRSA Assay (FDA approved for NASAL specimens only), is one component of a comprehensive MRSA colonization surveillance program. It is not intended to diagnose MRSA infection nor to guide or monitor treatment for MRSA infections.      Radiology Studies: No results found.  Scheduled Meds: . albuterol  2.5 mg Nebulization BID  . aspirin  81 mg Oral q morning - 10a  . atorvastatin  80 mg Oral Daily  . docusate sodium  100 mg Oral BID  . epoetin (EPOGEN/PROCRIT) injection  10,000 Units Intravenous Q M,W,F-HD  . feeding supplement (PRO-STAT SUGAR FREE 64)  30 mL Oral TID BM  . furosemide  100 mg Intravenous BID  . heparin  5,000 Units Subcutaneous Q8H  . hydroxychloroquine  200 mg Oral Daily  . insulin aspart  0-9 Units Subcutaneous TID WC  . metoprolol succinate  25 mg Oral q morning - 10a  . omega-3 acid ethyl esters  2 g Oral BID  . polyethylene glycol  17 g Oral BID  . predniSONE  7.5 mg Oral Q breakfast  . pregabalin  25 mg Oral QHS  . sodium chloride flush  3 mL Intravenous Q12H  .  traZODone  50 mg Oral QHS  . vitamin C  500 mg Oral Daily   Continuous Infusions:  None.    LOS: 3 days   Nyeem Stoke,  MD FACP Hospitalist.   If 7PM-7AM, please contact night-coverage [email or web address] Password [email or web address] 08/29/2016, 2:26 PM

## 2016-08-30 DIAGNOSIS — I5020 Unspecified systolic (congestive) heart failure: Secondary | ICD-10-CM | POA: Diagnosis not present

## 2016-08-30 DIAGNOSIS — N401 Enlarged prostate with lower urinary tract symptoms: Secondary | ICD-10-CM | POA: Diagnosis not present

## 2016-08-30 DIAGNOSIS — Z992 Dependence on renal dialysis: Secondary | ICD-10-CM | POA: Diagnosis not present

## 2016-08-30 DIAGNOSIS — I10 Essential (primary) hypertension: Secondary | ICD-10-CM | POA: Diagnosis not present

## 2016-08-30 DIAGNOSIS — N184 Chronic kidney disease, stage 4 (severe): Secondary | ICD-10-CM | POA: Diagnosis not present

## 2016-08-30 DIAGNOSIS — G4709 Other insomnia: Secondary | ICD-10-CM | POA: Diagnosis not present

## 2016-08-30 DIAGNOSIS — I5022 Chronic systolic (congestive) heart failure: Secondary | ICD-10-CM | POA: Diagnosis not present

## 2016-08-30 DIAGNOSIS — E871 Hypo-osmolality and hyponatremia: Secondary | ICD-10-CM | POA: Diagnosis not present

## 2016-08-30 DIAGNOSIS — R0600 Dyspnea, unspecified: Secondary | ICD-10-CM | POA: Diagnosis not present

## 2016-08-30 DIAGNOSIS — E875 Hyperkalemia: Secondary | ICD-10-CM | POA: Diagnosis not present

## 2016-08-30 DIAGNOSIS — R05 Cough: Secondary | ICD-10-CM | POA: Diagnosis not present

## 2016-08-30 DIAGNOSIS — R1310 Dysphagia, unspecified: Secondary | ICD-10-CM | POA: Diagnosis present

## 2016-08-30 DIAGNOSIS — R569 Unspecified convulsions: Secondary | ICD-10-CM | POA: Diagnosis not present

## 2016-08-30 DIAGNOSIS — R279 Unspecified lack of coordination: Secondary | ICD-10-CM | POA: Diagnosis not present

## 2016-08-30 DIAGNOSIS — Z823 Family history of stroke: Secondary | ICD-10-CM | POA: Diagnosis not present

## 2016-08-30 DIAGNOSIS — N179 Acute kidney failure, unspecified: Secondary | ICD-10-CM | POA: Diagnosis not present

## 2016-08-30 DIAGNOSIS — G8929 Other chronic pain: Secondary | ICD-10-CM | POA: Diagnosis present

## 2016-08-30 DIAGNOSIS — M069 Rheumatoid arthritis, unspecified: Secondary | ICD-10-CM | POA: Diagnosis not present

## 2016-08-30 DIAGNOSIS — M549 Dorsalgia, unspecified: Secondary | ICD-10-CM | POA: Diagnosis present

## 2016-08-30 DIAGNOSIS — I2510 Atherosclerotic heart disease of native coronary artery without angina pectoris: Secondary | ICD-10-CM | POA: Diagnosis present

## 2016-08-30 DIAGNOSIS — N183 Chronic kidney disease, stage 3 (moderate): Secondary | ICD-10-CM | POA: Diagnosis not present

## 2016-08-30 DIAGNOSIS — J189 Pneumonia, unspecified organism: Secondary | ICD-10-CM | POA: Diagnosis not present

## 2016-08-30 DIAGNOSIS — M6281 Muscle weakness (generalized): Secondary | ICD-10-CM | POA: Diagnosis not present

## 2016-08-30 DIAGNOSIS — Z833 Family history of diabetes mellitus: Secondary | ICD-10-CM | POA: Diagnosis not present

## 2016-08-30 DIAGNOSIS — D638 Anemia in other chronic diseases classified elsewhere: Secondary | ICD-10-CM | POA: Diagnosis not present

## 2016-08-30 DIAGNOSIS — R1312 Dysphagia, oropharyngeal phase: Secondary | ICD-10-CM | POA: Diagnosis not present

## 2016-08-30 DIAGNOSIS — E8889 Other specified metabolic disorders: Secondary | ICD-10-CM | POA: Diagnosis present

## 2016-08-30 DIAGNOSIS — D539 Nutritional anemia, unspecified: Secondary | ICD-10-CM | POA: Diagnosis not present

## 2016-08-30 DIAGNOSIS — E1121 Type 2 diabetes mellitus with diabetic nephropathy: Secondary | ICD-10-CM | POA: Diagnosis not present

## 2016-08-30 DIAGNOSIS — Z794 Long term (current) use of insulin: Secondary | ICD-10-CM | POA: Diagnosis not present

## 2016-08-30 DIAGNOSIS — Z4781 Encounter for orthopedic aftercare following surgical amputation: Secondary | ICD-10-CM | POA: Diagnosis not present

## 2016-08-30 DIAGNOSIS — J810 Acute pulmonary edema: Secondary | ICD-10-CM | POA: Diagnosis not present

## 2016-08-30 DIAGNOSIS — N186 End stage renal disease: Secondary | ICD-10-CM | POA: Diagnosis not present

## 2016-08-30 DIAGNOSIS — I130 Hypertensive heart and chronic kidney disease with heart failure and stage 1 through stage 4 chronic kidney disease, or unspecified chronic kidney disease: Secondary | ICD-10-CM | POA: Diagnosis present

## 2016-08-30 DIAGNOSIS — I5043 Acute on chronic combined systolic (congestive) and diastolic (congestive) heart failure: Secondary | ICD-10-CM | POA: Diagnosis not present

## 2016-08-30 DIAGNOSIS — E1149 Type 2 diabetes mellitus with other diabetic neurological complication: Secondary | ICD-10-CM | POA: Diagnosis not present

## 2016-08-30 DIAGNOSIS — Z8249 Family history of ischemic heart disease and other diseases of the circulatory system: Secondary | ICD-10-CM | POA: Diagnosis not present

## 2016-08-30 DIAGNOSIS — R064 Hyperventilation: Secondary | ICD-10-CM | POA: Diagnosis not present

## 2016-08-30 DIAGNOSIS — I5033 Acute on chronic diastolic (congestive) heart failure: Secondary | ICD-10-CM | POA: Diagnosis not present

## 2016-08-30 DIAGNOSIS — Z89512 Acquired absence of left leg below knee: Secondary | ICD-10-CM | POA: Diagnosis not present

## 2016-08-30 DIAGNOSIS — E1140 Type 2 diabetes mellitus with diabetic neuropathy, unspecified: Secondary | ICD-10-CM | POA: Diagnosis present

## 2016-08-30 DIAGNOSIS — L89152 Pressure ulcer of sacral region, stage 2: Secondary | ICD-10-CM | POA: Diagnosis present

## 2016-08-30 DIAGNOSIS — I5042 Chronic combined systolic (congestive) and diastolic (congestive) heart failure: Secondary | ICD-10-CM | POA: Diagnosis not present

## 2016-08-30 DIAGNOSIS — R262 Difficulty in walking, not elsewhere classified: Secondary | ICD-10-CM | POA: Diagnosis not present

## 2016-08-30 DIAGNOSIS — E785 Hyperlipidemia, unspecified: Secondary | ICD-10-CM | POA: Diagnosis not present

## 2016-08-30 DIAGNOSIS — J9691 Respiratory failure, unspecified with hypoxia: Secondary | ICD-10-CM | POA: Diagnosis not present

## 2016-08-30 DIAGNOSIS — T7840XD Allergy, unspecified, subsequent encounter: Secondary | ICD-10-CM | POA: Diagnosis not present

## 2016-08-30 DIAGNOSIS — Y95 Nosocomial condition: Secondary | ICD-10-CM | POA: Diagnosis present

## 2016-08-30 DIAGNOSIS — E1122 Type 2 diabetes mellitus with diabetic chronic kidney disease: Secondary | ICD-10-CM | POA: Diagnosis present

## 2016-08-30 DIAGNOSIS — G92 Toxic encephalopathy: Secondary | ICD-10-CM | POA: Diagnosis present

## 2016-08-30 DIAGNOSIS — E1151 Type 2 diabetes mellitus with diabetic peripheral angiopathy without gangrene: Secondary | ICD-10-CM | POA: Diagnosis present

## 2016-08-30 DIAGNOSIS — M0609 Rheumatoid arthritis without rheumatoid factor, multiple sites: Secondary | ICD-10-CM | POA: Diagnosis not present

## 2016-08-30 DIAGNOSIS — G729 Myopathy, unspecified: Secondary | ICD-10-CM | POA: Diagnosis not present

## 2016-08-30 LAB — GLUCOSE, CAPILLARY
GLUCOSE-CAPILLARY: 163 mg/dL — AB (ref 65–99)
Glucose-Capillary: 100 mg/dL — ABNORMAL HIGH (ref 65–99)

## 2016-08-30 LAB — CBC
HCT: 32.4 % — ABNORMAL LOW (ref 39.0–52.0)
Hemoglobin: 10.2 g/dL — ABNORMAL LOW (ref 13.0–17.0)
MCH: 31 pg (ref 26.0–34.0)
MCHC: 31.5 g/dL (ref 30.0–36.0)
MCV: 98.5 fL (ref 78.0–100.0)
Platelets: 267 10*3/uL (ref 150–400)
RBC: 3.29 MIL/uL — ABNORMAL LOW (ref 4.22–5.81)
RDW: 17.1 % — ABNORMAL HIGH (ref 11.5–15.5)
WBC: 9.8 10*3/uL (ref 4.0–10.5)

## 2016-08-30 LAB — BASIC METABOLIC PANEL
Anion gap: 5 (ref 5–15)
BUN: 34 mg/dL — ABNORMAL HIGH (ref 6–20)
CO2: 29 mmol/L (ref 22–32)
Calcium: 8.3 mg/dL — ABNORMAL LOW (ref 8.9–10.3)
Chloride: 95 mmol/L — ABNORMAL LOW (ref 101–111)
Creatinine, Ser: 1.89 mg/dL — ABNORMAL HIGH (ref 0.61–1.24)
GFR calc Af Amer: 41 mL/min — ABNORMAL LOW (ref >60)
GFR calc non Af Amer: 35 mL/min — ABNORMAL LOW (ref >60)
Glucose, Bld: 109 mg/dL — ABNORMAL HIGH (ref 65–99)
Potassium: 4.8 mmol/L (ref 3.5–5.1)
Sodium: 129 mmol/L — ABNORMAL LOW (ref 135–145)

## 2016-08-30 LAB — PHOSPHORUS: Phosphorus: 4.3 mg/dL (ref 2.5–4.6)

## 2016-08-30 NOTE — Progress Notes (Signed)
[name]  MRN: [id number]  DOB/AGE: [birth year] 65 y.o.  Primary Care Physician:JONAS, Archer Asa, MD  Admit date: 08/26/2016  Chief Complaint:  Chief Complaint  Patient presents with  . Shortness of Breath    S-Pt presented on  08/26/2016 with  Chief Complaint  Patient presents with  . Shortness of Breath  .     Pt offers no new complaints. Pt main concern is " Do I need dialysis today? "   Meds . albuterol  2.5 mg Nebulization BID  . aspirin  81 mg Oral q morning - 10a  . atorvastatin  80 mg Oral Daily  . docusate sodium  100 mg Oral BID  . epoetin (EPOGEN/PROCRIT) injection  10,000 Units Intravenous Q M,W,F-HD  . feeding supplement (PRO-STAT SUGAR FREE 64)  30 mL Oral TID BM  . furosemide  100 mg Intravenous BID  . heparin  5,000 Units Subcutaneous Q8H  . hydroxychloroquine  200 mg Oral Daily  . insulin aspart  0-9 Units Subcutaneous TID WC  . metoprolol succinate  25 mg Oral q morning - 10a  . omega-3 acid ethyl esters  2 g Oral BID  . polyethylene glycol  17 g Oral BID  . predniSONE  7.5 mg Oral Q breakfast  . pregabalin  25 mg Oral QHS  . sodium chloride flush  3 mL Intravenous Q12H  . traZODone  50 mg Oral QHS  . vitamin C  500 mg Oral Daily    Physical Exam: Vital signs in last 24 hours: Temp:  [98.1 F (36.7 C)-98.4 F (36.9 C)] 98.4 F (36.9 C) (09/05 0500) Pulse Rate:  [82-85] 82 (09/05 0500) Resp:  [20] 20 (09/05 0500) BP: (144-157)/(70-80) 157/72 (09/05 0500) SpO2:  [93 %-100 %] 94 % (09/05 0731) Weight change:  Last BM Date: 08/28/16  Intake/Output from previous day: 09/04 0701 - 09/05 0700 In: 240 [P.O.:240] Out: 2050 [Urine:2050] No intake/output data recorded.   Physical Exam: General- pt is lethargic but arousable Resp- No acute REsp distress, decreased at bases. CVS- S1S2 regular in rate and rhythm GIT- BS+, soft, NT, ND EXT- 1-2+ LE Edema,No Cyanosis           Left BKA   Lab Results: CBC  Recent Labs  08/30/16 0613  WBC  9.8  HGB 10.2*  HCT 32.4*  PLT 267    BMET  Recent Labs  08/29/16 0634 08/30/16 0613  NA 130* 129*  K 4.7 4.8  CL 96* 95*  CO2 30 29  GLUCOSE 189* 109*  BUN 27* 34*  CREATININE 1.84* 1.89*  CALCIUM 8.3* 8.3*   Creat 2017  1.3=>1.89  MICRO Recent Results (from the past 240 hour(s))  MRSA PCR Screening     Status: None   Collection Time: 08/26/16 12:05 PM  Result Value Ref Range Status   MRSA by PCR NEGATIVE NEGATIVE Final    Comment:        The GeneXpert MRSA Assay (FDA approved for NASAL specimens only), is one component of a comprehensive MRSA colonization surveillance program. It is not intended to diagnose MRSA infection nor to guide or monitor treatment for MRSA infections.       Lab Results  Component Value Date   CALCIUM 8.3 (L) 08/30/2016   PHOS 4.3 08/30/2016        Impression: 1)Renal  AKI secondary to ATN               AKI on CKD  CKD stage 3.               AKI requiring HD since admission at [facility]               Pt was last dialyzed Friday(08/26/16).               Pt creat now holding at 1.7--1.8               Pt with adequate urine output               Will hold off to HD    2)HTN  Medication- On Beta blockers    3)Anemia HGb stable   4)Rheumatoly-hx of RA On Hydroxychloroquine ans Steriods Primary MD following  5)CNS-hx of Seizure disorder Primary MD following  6)Electrolytes  Normokalemic  Hyponatremic    Hypervolemia Hyponatremia    On  lasix  7)Acid base Co2 at goal  8) Access-Tunneled cath in situ but not being used currently. I have asked HD nurse here to maintain as per protocol. But in case pt is discharged this will need to be followed till we decide about removing the cath. The decision to remove cath is impending upon his creat trend in next 2 weeks if pt creat stabilizes and his fluid status improves he will not need HD but in case creat worsens of anasarca worsens he may. Nursing director at  [facility] center has been called as well.     Plan:  No need for HD today Will recheck BMET in am ,if creat stable at 1.7--1.8.will hold off HD Pt has adequate urine out, will hold off on hd . If dced will suggest to check BMET twice a week and will see pt in 1 week time.    Jeremiah Tarpley S 08/30/2016, 8:58 AM

## 2016-08-30 NOTE — Progress Notes (Addendum)
Report called to [facility] at the [facility].  Answered questions at this time.  Pt IV removed, tolerated well.  Pt to transport via bed to [facility] to Room 119.

## 2016-08-30 NOTE — Evaluation (Signed)
Clinical/Bedside Swallow Evaluation Patient Details  Name: [name] MRN: [id number] Date of Birth: 14-Oct-1951  Today's Date: 08/30/2016 Time: SLP Start Time (ACUTE ONLY): 1200 SLP Stop Time (ACUTE ONLY): 1227 SLP Time Calculation (min) (ACUTE ONLY): 27 min  Past Medical History:  Past Medical History:  Diagnosis Date  . Chronic back pain   . Chronic neck pain   . Coronary artery disease 1993   angioplasty after AMI  . Diabetes mellitus   . Diabetic neuropathy ([place])   . Hyperkalemia   . Hypertension   . Pancytopenia ([place]) 08/13/2014  . 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  . Rheumatoid arthritis(714.0)   . Seizure ([place]) 08/13/2014   Pt denies   Past Surgical History:  Past Surgical History:  Procedure Laterality Date  . CARDIAC SURGERY    . FRACTURE SURGERY    . JOINT REPLACEMENT     HPI:  [name] is an 65 y.o. male with hx of complex partial Sz, Prolonged QTc, ARF requiring dialysis, chronic hypotension, pancytopenia, DM, RA on chronic steroid and placquenil, hx of respiratory failure requiring intubation, presented to the ED with bilateral pedal edema and SOB. EValuation in the ER showed CXR with bilateral effusion and pulmonary edema.  His Cr was 1.4, K of 3.3, and Hb of 9.3 g per dL.  EDP spoke with Dr Chilton Greathouse, and hospitalist was asked to admit him for volume overload, requiring HD.  He has no CP, fever, chills, nausea, vomiting or confusion. He had dialysis yesterday, and is feeling better today. He is requesting Methadone, but I would prefer that he not resume it, as it was causing him lethargy and weakness.     MBSS from [facility] <<EXAM: FL BARIUM SWALLOW MODIFIED DATE: 07/08/2016 12:02 PM ACCESSION: [id number] UN DICTATED: 07/08/2016 1:33 PM INTERPRETATION LOCATION: [place]  CLINICAL INDICATION: 65 years old Male with r/o aspiration--  TECHNIQUE: Modified barium swallow was performed. The patient  was given oral contrast of various consistencies by the speech pathologist and observed fluoroscopically swallowing in the lateral position.  Fluoroscopy time was 2.7 minutes using a low dose system with pulsed fluoroscopy at 7.5 frames per second.   COMPARISON: None.  FINDINGS:  With bolus delivery there was prompt pharyngeal contraction. There is silent laryngeal penetration without aspiration for thick liquids and silent laryngeal penetration with aspiration for thin liquid. There is a residue for both consistencies.    IMPRESSION:  Laryngeal penetration without aspiration for thick liquid. Laryngeal penetration with aspiration for thin liquid.>>  <<Interface, Rad Results In - 07/15/2016 3:10 PM EDT  EXAM: FL BARIUM SWALLOW MODIFIED DATE: 07/15/2016 10:22 AM ACCESSION: [id number] UN DICTATED: 07/15/2016 2:32 PM INTERPRETATION LOCATION: [place]  CLINICAL INDICATION: 65 years old Male with dysphagia--   TECHNIQUE: Modified barium swallow was performed. The patient was given oral contrast of various consistencies by the speech pathologist and observed fluoroscopically swallowing in the lateral position.  Fluoroscopy time was 3.5 minutes using a low dose system with pulsed fluoroscopy at 7.5 frames per second.   COMPARISON: None.  FINDINGS:  With bolus delivery there was prompt pharyngeal contraction. There was silent laryngeal penetration without aspiration for mixed solid/liquid consistency. No significant penetration or aspiration was noted for all other consistencies tested. There was moderate residue for puree, solid, mixed consistencies.   IMPRESSION:  Laryngeal penetration without aspiration for mixed consistency. Moderate residue with puree, solid and mixed consistency. >>   Assessment / Plan / Recommendation Clinical  Impression  Pt assessed at bedside with lunch meal. His wife was feeding him meal upon SLP arrival. RN reported that she had to instruct family to raise  [facility] to appropriate level for safe po intake. SLP reinforced this with pt as well. Evidence of MBSS completed at [facility] found via "Care Everywhere", however only the radiologists' notes found (7/14 and 07/15/16); see above.  Initial report shows silent penetration with aspiration, however follow up study showed penetration without aspiration and moderate pharyngeal residue. Oral motor examination is fairly unremarkable today except for U/L dentures which pt able to move loosely in oral cavity. Swallow initiation appears prompt; occasional delayed cough noted over lunch meal. Wife reports some coughing at baseline during po intake prior to this hospitalization, but notes an increase at this time. RN noted that she has frequently had to remind pt's wife to elevate HOB during po intake. Chest x-ray does not show definite PNA (9/1), however pt is at risk developing aspiration pna given dependent feeder status and physical immobility. Pt/wife educated on safe swallow precautions (increase HOB, small bites/sips, swallow 2-3 times for each bite/sip, and remain upright following po for at least 30 minutes. Wife/pt verbalize agreement. Recommend f/u SLP at SNF to ensure diet tolerance and additional caregiver/pt education.     Aspiration Risk  Mild aspiration risk    Diet Recommendation Dysphagia 3 (Mech soft);Thin liquid   Liquid Administration via: Cup;Straw Medication Administration: Whole meds with liquid Supervision: Full supervision/cueing for compensatory strategies;Staff to assist with self feeding Compensations: Slow rate;Small sips/bites;Lingual sweep for clearance of pocketing;Multiple dry swallows after each bite/sip Postural Changes: Seated upright at 90 degrees;Remain upright for at least 30 minutes after po intake    Other  Recommendations Oral Care Recommendations: Oral care BID Other Recommendations: Clarify dietary restrictions   Follow up Recommendations  Skilled Nursing  facility    Frequency and Duration            Prognosis Prognosis for Safe Diet Advancement: Fair      Swallow Study   General Date of Onset: 08/26/16 HPI: [name] is an 65 y.o. male with hx of complex partial Sz, Prolonged QTc, ARF requiring dialysis, chronic hypotension, pancytopenia, DM, RA on chronic steroid and placquenil, hx of respiratory failure requiring intubation, presented to the ED with bilateral pedal edema and SOB. EValuation in the ER showed CXR with bilateral effusion and pulmonary edema.  His Cr was 1.4, K of 3.3, and Hb of 9.3 g per dL.  EDP spoke with Dr Chilton Greathouse, and hospitalist was asked to admit him for volume overload, requiring HD.  He has no CP, fever, chills, nausea, vomiting or confusion. He had dialysis yesterday, and is feeling better today. He is requesting Methadone, but I would prefer that he not resume it, as it was causing him lethargy and weakness.   Type of Study: Bedside Swallow Evaluation Previous Swallow Assessment: None on record, but has reportedly been seen by SLP in the past Diet Prior to this Study: Regular;Thin liquids Temperature Spikes Noted: No Respiratory Status: Room air History of Recent Intubation: No Behavior/Cognition: Alert;Cooperative;Pleasant mood Oral Cavity Assessment: Within Functional Limits Oral Care Completed by SLP: No Oral Cavity - Dentition: Dentures, top;Dentures, bottom Vision: Functional for self-feeding Self-Feeding Abilities: Needs assist Patient Positioning: Upright in bed Baseline Vocal Quality: Normal;Hoarse Volitional Cough: Strong;Congested Volitional Swallow: Able to elicit    Oral/Motor/Sensory Function Overall Oral Motor/Sensory Function: Within functional limits   Ice Chips Ice chips:  Within functional limits Presentation: Spoon   Thin Liquid Thin Liquid: Within functional limits Presentation: Straw    Nectar Thick Nectar Thick Liquid: Not tested   Honey Thick Honey Thick Liquid: Not tested    Puree Puree: Within functional limits Presentation: Spoon   Solid   Thank you,  Genene Churn, CCC-SLP [phone]    Solid: Within functional limits Presentation: Spoon Other Comments: mech soft, mild lingual residuals which clear with liquid wash        Ansel Ferrall 08/30/2016,12:48 PM

## 2016-08-30 NOTE — Clinical Social Work Note (Signed)
CSW notified Tammi at [facility] that patient would discharge today and would return to the facility.   CSW notified patient's wife, [name], of patient's discharge and that he would be returning to [facility].  CSW signing off.   Meeya Goldin, Clydene Pugh, LCSW

## 2016-08-30 NOTE — Discharge Summary (Signed)
Physician Discharge Summary  [name] [id number] DOB: [birth year] DOA: 08/26/2016  PCP: [name] Lund, MD  Admit date: 08/26/2016 Discharge date: 08/30/2016  Admitted From: SNF Disposition:  SNF  Recommendations for Outpatient Follow-up:  1. Follow up with PCP in one week.  2. Please obtain BMP/CBC in one week 3. Follow up with primary nephrologist this week, [name].   Home Health: :None.  Equipment/Devices: None.  Discharge Condition: improved.  CODE STATUS:FULL CODE.  Diet recommendation: Fluid limit at 1500cc per day.  Renal diet with carb modified.  Dysphagia III, thin liquid recommended by Speech.   Brief/Interim Summary:  Patient was admitted by me on Sept 1, 2017 for SOB, felt to be due to volume overload requiring dialysis.  As per my prior H and P:  " [name] is an 65 y.o. male with hx of complex partial Sz, Prolonged QTc, ARF requiring dialysis, chronic hypotension, pancytopenia, DM, RA on chronic steroid and placquenil, hx of respiratory failure requiring intubation, presented to the ED with bilateral pedal edema and SOB. EValuation in the ER showed CXR with bilateral effusion and pulmonary edema.  His Cr was 1.4, K of 3.3, and Hb of 9.3 g per dL.  EDP spoke with [name], and hospitalist was asked to admit him for volume overload, requiring HD.  He has no CP, fever, chills, nausea, vomiting or confusion.   ED Course: Mostly diagnostic tests were performed.   HOSPITAL COURSE:  Patient was admitted into the hospital, and [name] dialized him with removal of 3.6 L.  He was given IV Lasix as well, and diuresed another liter the first day.  His SOB and swelling improved almost immediately.  He was kept in the hospital as nephrologist would like to diurese him further, and he was given IV Lasix.  His Cr did go up progressively, and on the day of discharge, it went up to 1.8.  Nephrology did not feel he requires dialysis anymore at this time, and doesn't require  ultrafiltration.  His volume status has improved, and this is why he didn't require oral lasix upon discharge.  This is also as his Cr has risen also.  He will need to see [name] to see if he need further dialysis in the near future.  Respiratory was concerned he may have intermittent aspiration, so speech eval was requested.  For his QTc prolongation, it was interesting that his methadone was discontinued, and his QTc was corrected.  He may require methadone in the future, but for now, he is amenable to stop it.  I do think it makes him lethargic and weaker while he was on it.  For his hypotension, his Prednisone was resume at a little bit higher dose, 7.5mg  per day instead of 5mg  per day.  He had no seizure, and his medication was continued.  He is now stable for discharge, and will be discharged back to his SNF today.  He should follow up with his PCP in 1-2 weeks, and with his nephrologist this week, in determining if he should resume his lasix, or ultrafiltration or dialysis.  [name] is making arrangement for maintenance of his permacath, since it may not be used more regularly at dialysis.  Thank you for allowing me to participate in his care,  Good Day.   Discharge Diagnoses:  Principal Problem:   ESRD needing dialysis [facility]) Active Problems:   Coronary artery disease   DM (diabetes mellitus), type 2 with neurological complications (  [place])   Hyperlipidemia   Seizure ([place])   Prolonged QT interval   Systolic heart failure (HCC)   Pressure ulcer    Discharge Instructions  Discharge Instructions    Diet - low sodium heart healthy    Complete by:  As directed   Diet - low sodium heart healthy    Complete by:  As directed   Discharge instructions    Complete by:  As directed   Follow up with your kidney doctor for regular dialysis schedules.   Increase activity slowly    Complete by:  As directed   Increase activity slowly    Complete by:  As directed       Medication List     STOP taking these medications   diclofenac sodium 1 % Gel Commonly known as:  VOLTAREN   methadone 10 MG tablet Commonly known as:  DOLOPHINE     TAKE these medications   acetaminophen 325 MG tablet Commonly known as:  TYLENOL Take 650 mg by mouth every 6 (six) hours as needed.   albuterol (2.5 MG/3ML) 0.083% nebulizer solution Commonly known as:  PROVENTIL Take 2.5 mg by nebulization every 4 (four) hours as needed for wheezing or shortness of breath.   amiodarone 200 MG tablet Commonly known as:  PACERONE Take 200 mg by mouth daily.   aspirin 81 MG chewable tablet Chew 81 mg by mouth every morning.   atorvastatin 80 MG tablet Commonly known as:  LIPITOR Take 80 mg by mouth daily.   docusate sodium 100 MG capsule Commonly known as:  COLACE Take 100 mg by mouth 2 (two) times daily.   feeding supplement (PRO-STAT SUGAR FREE 64) Liqd 30 ml by mouth twice a day between meals.   HUMULIN N 100 UNIT/ML injection Generic drug:  insulin NPH Human Give 5 units subcutaneous twice a day   hydroxychloroquine 200 MG tablet Commonly known as:  PLAQUENIL Take 200 mg by mouth daily.   HYPROMELLOSE OP Apply to eye. (Artificial tears,)Apply one drop to each eye as needed for dry eyes.   Melatonin 3 MG Tabs Take 2 tablets by mouth at bedtime   metoprolol succinate 25 MG 24 hr tablet Commonly known as:  TOPROL-XL Take 25 mg by mouth every morning.   nystatin cream Commonly known as:  MYCOSTATIN Mix with zinc oxide and apply to sacrum daily & prn   olopatadine 0.1 % ophthalmic solution Commonly known as:  PATANOL Place 1 drop into both eyes 2 (two) times daily.   omega-3 acid ethyl esters 1 g capsule Commonly known as:  LOVAZA Take 2 g by mouth 2 (two) times daily.   oxyCODONE 5 MG immediate release tablet Commonly known as:  Oxy IR/ROXICODONE Take 5 mg by mouth every 12 (twelve) hours as needed for severe pain.   OXYGEN O 2 at 2 LPM via n/c to keep O2 sat above 90 %  every shift   polyethylene glycol powder powder Commonly known as:  MIRALAX Take 17 g by mouth 2 (two) times daily.   predniSONE 5 MG tablet Commonly known as:  DELTASONE Take 5 mg by mouth every morning. START ON 08/10/16 What changed:  Another medication with the same name was added. Make sure you understand how and when to take each.   predniSONE 2.5 MG tablet Commonly known as:  DELTASONE Take 3 tablets (7.5 mg total) by mouth daily with breakfast. What changed:  You were already taking a medication with the same name, and  this prescription was added. Make sure you understand how and when to take each.   pregabalin 25 MG capsule Commonly known as:  LYRICA Take 25 mg by mouth at bedtime.   REFRESH P.M. OP Apply to eye. ONE APPLICATION BOTH EYES. For dry eyes not relieved by theratears every 6 hours   senna-docusate 8.6-50 MG tablet Commonly known as:  Senokot-S Take 2 tablets by mouth daily as needed for mild constipation.   silver nitrate applicators A999333 % applicator May apply silver nitrate applicator stick to epithelialized rounded borders of thigh wound daily as needed to promote wound healing once daily   tamsulosin 0.4 MG Caps capsule Commonly known as:  FLOMAX Take 0.4 mg by mouth at bedtime.   THERA-M ENHANCED PO 1 tablet by mouth daily   THERATEARS 0.25 % Soln Generic drug:  Carboxymethylcellulose Sodium Apply 2 drops to both eyes three times a day   thiamine 100 MG tablet Commonly known as:  VITAMIN B-1 Take 100 mg by mouth daily.   traZODone 50 MG tablet Commonly known as:  DESYREL Take 50 mg by mouth at bedtime.   vitamin C 500 MG tablet Commonly known as:  ASCORBIC ACID Take 500 mg by mouth daily.   Zinc Oxide 10 % Oint Mix with Nystatin and apply to sacrum daily & prn as ordered once a day   zinc sulfate 220 (50 Zn) MG capsule Take 220 mg by mouth daily.       Allergies  Allergen Reactions  . Sulfa Antibiotics Rash     Consultations:  Speech  Nephrology.   Procedures/Studies: Dg Chest Port 1 View  Result Date: 08/26/2016 CLINICAL DATA:  Nonproductive cough, chest pressure and worsening dyspnea for 4 days EXAM: PORTABLE CHEST 1 VIEW COMPARISON:  04/04/2016 FINDINGS: There are pleural effusions bilaterally, right greater than left. There is hazy central airspace opacity which may represent alveolar edema. Mild pulmonary vascular prominence. Mild cardiomegaly. There is a dual-lumen left-sided central line extending into the right atrium. Incidental findings include severe shoulder arthropathy bilaterally. IMPRESSION: Congestive heart failure with alveolar edema and/or right pleural effusion. Electronically Signed   By: Andreas Newport M.D.   On: 08/26/2016 05:25       Subjective: Feels fine today.    Discharge Exam: Vitals:   08/29/16 2049 08/30/16 0500  BP: (!) 147/70 (!) 157/72  Pulse: 85 82  Resp: 20 20  Temp: 98.4 F (36.9 C) 98.4 F (36.9 C)   Vitals:   08/29/16 1917 08/29/16 2049 08/30/16 0500 08/30/16 0731  BP:  (!) 147/70 (!) 157/72   Pulse:  85 82   Resp:  20 20   Temp:  98.4 F (36.9 C) 98.4 F (36.9 C)   TempSrc:  Oral Oral   SpO2: 93% 96% 97% 94%  Weight:      Height:        General: Pt is alert, awake, not in acute distress Cardiovascular: RRR, S1/S2 +, no rubs, no gallops Respiratory: CTA bilaterally, no wheezing, no rhonchi Abdominal: Soft, NT, ND, bowel sounds + Extremities: no edema, no cyanosis    The results of significant diagnostics from this hospitalization (including imaging, microbiology, ancillary and laboratory) are listed below for reference.     Microbiology: Recent Results (from the past 240 hour(s))  MRSA PCR Screening     Status: None   Collection Time: 08/26/16 12:05 PM  Result Value Ref Range Status   MRSA by PCR NEGATIVE NEGATIVE Final    Comment:  The GeneXpert MRSA Assay (FDA approved for NASAL specimens only), is one  component of a comprehensive MRSA colonization surveillance program. It is not intended to diagnose MRSA infection nor to guide or monitor treatment for MRSA infections.      Labs: BNP (last 3 results)  Recent Labs  08/26/16 0509  BNP A999333*   Basic Metabolic Panel:  Recent Labs Lab 08/26/16 0509 08/26/16 0518 08/27/16 0630 08/28/16 0937 08/29/16 0634 08/30/16 0613  NA 133* 134* 132*  132* 131* 130* 129*  K 3.3* 3.3* 4.1  4.1 4.2 4.7 4.8  CL 100* 100* 99*  98* 96* 96* 95*  CO2 29 27 29  30 29 30 29   GLUCOSE 79 75 140*  139* 167* 189* 109*  BUN 16 16 13  14  22* 27* 34*  CREATININE 1.36* 1.34* 1.25*  1.28* 1.72* 1.84* 1.89*  CALCIUM 8.0* 8.2* 7.8*  7.9* 8.2* 8.3* 8.3*  MG 2.5*  --   --   --   --   --   PHOS  --  3.6 3.5  --  4.4 4.3   Liver Function Tests:  Recent Labs Lab 08/26/16 0509 08/26/16 0518 08/27/16 0630  AST 26  --  25  ALT 14*  --  14*  ALKPHOS 191*  --  211*  BILITOT 0.7  --  0.7  PROT 5.0*  --  4.7*  ALBUMIN 1.8* 1.9* 1.8*  1.8*   CBC:  Recent Labs Lab 08/26/16 0509 08/27/16 0630 08/30/16 0613  WBC 7.9 7.1 9.8  NEUTROABS 4.8  --   --   HGB 9.3* 9.3* 10.2*  HCT 29.8* 29.9* 32.4*  MCV 99.7 99.3 98.5  PLT 222 197 267   Cardiac Enzymes:  Recent Labs Lab 08/26/16 0509  TROPONINI 0.04*   BNP: Invalid input(s): POCBNP CBG:  Recent Labs Lab 08/29/16 1120 08/29/16 1621 08/29/16 2047 08/30/16 0743 08/30/16 1141  GLUCAP 152* 172* 163* 100* 163*   D-Dimer Urinalysis    Component Value Date/Time   COLORURINE [name] (A) 08/26/2016 0940   APPEARANCEUR CLEAR 08/26/2016 0940   LABSPEC 1.020 08/26/2016 0940   PHURINE 6.0 08/26/2016 0940   GLUCOSEU NEGATIVE 08/26/2016 0940   HGBUR TRACE (A) 08/26/2016 0940   BILIRUBINUR NEGATIVE 08/26/2016 0940   KETONESUR NEGATIVE 08/26/2016 0940   PROTEINUR 30 (A) 08/26/2016 0940   UROBILINOGEN 0.2 08/12/2014 2358   NITRITE NEGATIVE 08/26/2016 0940   LEUKOCYTESUR TRACE (A)  08/26/2016 0940   Sepsis Labs Microbiology Recent Results (from the past 240 hour(s))  MRSA PCR Screening     Status: None   Collection Time: 08/26/16 12:05 PM  Result Value Ref Range Status   MRSA by PCR NEGATIVE NEGATIVE Final    Comment:        The GeneXpert MRSA Assay (FDA approved for NASAL specimens only), is one component of a comprehensive MRSA colonization surveillance program. It is not intended to diagnose MRSA infection nor to guide or monitor treatment for MRSA infections.      Time coordinating discharge: Over 30 minutes  SIGNED:  Orvan Falconer, MD FACP [facility] 08/30/2016, 11:59 AM   If 7PM-7AM, please contact night-coverage [email or web address] Password [email or web address]

## 2016-08-30 NOTE — Progress Notes (Signed)
SLP Cancellation Note  Patient Details Name: [name] MRN: [id number] DOB: 1951-10-14   Cancelled treatment:       Reason Eval/Treat Not Completed: Other (comment) (Nursing in with patient at this time.). SLP will return later today.  Thank you,  Genene Churn, [place]    [place] 08/30/2016, 10:47 AM

## 2016-08-31 ENCOUNTER — Other Ambulatory Visit: Payer: Self-pay | Admitting: *Deleted

## 2016-08-31 MED ORDER — OXYCODONE HCL 5 MG PO TABS: 5.0000 mg | ORAL_TABLET | Freq: Two times a day (BID) | ORAL | 0 refills | Status: DC | PRN

## 2016-08-31 MED ORDER — PREGABALIN 25 MG PO CAPS: ORAL_CAPSULE | ORAL | 0 refills | Status: DC

## 2016-08-31 NOTE — Telephone Encounter (Signed)
[facility] #[phone] Fax: [phone]   

## 2016-09-01 ENCOUNTER — Encounter (HOSPITAL_COMMUNITY)
Admission: RE | Admit: 2016-09-01 | Discharge: 2016-09-01 | Disposition: A | Discharge status: Home / Self Care | Payer: Medicare Other | Source: Skilled Nursing Facility | Attending: Internal Medicine | Admitting: Internal Medicine

## 2016-09-01 ENCOUNTER — Non-Acute Institutional Stay (SKILLED_NURSING_FACILITY): Payer: Medicare Other | Admitting: Internal Medicine

## 2016-09-01 ENCOUNTER — Encounter: Payer: Self-pay | Admitting: Internal Medicine

## 2016-09-01 DIAGNOSIS — I10 Essential (primary) hypertension: Secondary | ICD-10-CM

## 2016-09-01 DIAGNOSIS — N184 Chronic kidney disease, stage 4 (severe): Secondary | ICD-10-CM

## 2016-09-01 DIAGNOSIS — I5042 Chronic combined systolic (congestive) and diastolic (congestive) heart failure: Secondary | ICD-10-CM | POA: Diagnosis not present

## 2016-09-01 DIAGNOSIS — E1149 Type 2 diabetes mellitus with other diabetic neurological complication: Secondary | ICD-10-CM

## 2016-09-01 DIAGNOSIS — D638 Anemia in other chronic diseases classified elsewhere: Secondary | ICD-10-CM

## 2016-09-01 DIAGNOSIS — M069 Rheumatoid arthritis, unspecified: Secondary | ICD-10-CM | POA: Diagnosis not present

## 2016-09-01 LAB — BASIC METABOLIC PANEL
Anion gap: 6 (ref 5–15)
BUN: 43 mg/dL — ABNORMAL HIGH (ref 6–20)
CALCIUM: 8.1 mg/dL — AB (ref 8.9–10.3)
CHLORIDE: 94 mmol/L — AB (ref 101–111)
CO2: 31 mmol/L (ref 22–32)
CREATININE: 2.06 mg/dL — AB (ref 0.61–1.24)
GFR calc Af Amer: 37 mL/min — ABNORMAL LOW (ref >60)
GFR calc non Af Amer: 32 mL/min — ABNORMAL LOW (ref >60)
GLUCOSE: 151 mg/dL — AB (ref 65–99)
Potassium: 4.8 mmol/L (ref 3.5–5.1)
Sodium: 131 mmol/L — ABNORMAL LOW (ref 135–145)

## 2016-09-01 LAB — CBC
HEMATOCRIT: 29.9 % — AB (ref 39.0–52.0)
HEMOGLOBIN: 9.5 g/dL — AB (ref 13.0–17.0)
MCH: 30.5 pg (ref 26.0–34.0)
MCHC: 31.8 g/dL (ref 30.0–36.0)
MCV: 96.1 fL (ref 78.0–100.0)
Platelets: 232 10*3/uL (ref 150–400)
RBC: 3.11 MIL/uL — ABNORMAL LOW (ref 4.22–5.81)
RDW: 17.5 % — AB (ref 11.5–15.5)
WBC: 8.8 10*3/uL (ref 4.0–10.5)

## 2016-09-01 NOTE — Progress Notes (Signed)
Provider:  Veleta Miners Location:   [place] Room Number: 867/Y Place of Service:  SNF (31)  PCP: Lala Lund, MD Patient Care Team: Lala Lund, MD as PCP - General (Internal Medicine)  Extended Emergency Contact Information Primary Emergency Contact: First,Martha Address: [address]          [place], [place] [address] [place] Phone: [phone] Mobile Phone: [phone] Relation: Spouse Secondary Emergency Contact: Jannet Mantis [place] Phone: [phone] Relation: Daughter  Code Status: Full Code Goals of Care: Advanced Directive information Advanced Directives 09/01/2016  Does patient have an advance directive? Yes  Type of Advance Directive (No Data)  Does patient want to make changes to advanced directive? No - Patient declined  Copy of advanced directive(s) in chart? Yes  Would patient like information on creating an advanced directive? -  Pre-existing out of facility DNR order (yellow form or pink MOST form) -      Chief Complaint  Patient presents with  . Readmit To SNF    HPI: Patient is a 65 y.o. male seen today for readmission to SNF . He was in CHF and was send to hospital for further Management.Patient was dialyzed with removal of almost 3.6 L of fluid.and then was started on Lasix. He diuresed and started feeling much better. It was decided that He does not need Lasix or Dialysis right now. He has to follow up with his Nephrologist next week to further decide the course. Patient is still c/o SOB. His wife was with him and said that there is lot of anxiety associated. No Chest pain. Does have cough with clear sputum.  Past Medical History:  Diagnosis Date  . Chronic back pain   . Chronic neck pain   . Coronary artery disease 1993   angioplasty after AMI  . Diabetes mellitus   . Diabetic neuropathy ([place])   . Hyperkalemia   . Hypertension   . Pancytopenia ([place]) 08/13/2014    . 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  . Rheumatoid arthritis(714.0)   . Seizure ([place]) 08/13/2014   Pt denies   Past Surgical History:  Procedure Laterality Date  . CARDIAC SURGERY    . FRACTURE SURGERY    . JOINT REPLACEMENT      reports that he has never smoked. He has never used smokeless tobacco. He reports that he does not drink alcohol or use drugs. Social History   Social History  . Marital status: Married    Spouse name: N/A  . Number of children: N/A  . Years of education: N/A   Occupational History  . Not on file.   Social History Main Topics  . Smoking status: Never Smoker  . Smokeless tobacco: Never Used  . Alcohol use No  . Drug use: No  . Sexual activity: Not on file   Other Topics Concern  . Not on file   Social History Narrative  . No narrative on file    Functional Status Survey:    Family History  Problem Relation Age of Onset  . Diabetes Father   . Dementia Father   . Heart attack Brother     multiple brothers with MIs  . Hypertension Mother   . Stroke Mother     Health Maintenance  Topic Date Due  . Hepatitis C Screening  [birth year]  . FOOT EXAM  09/05/1959  . OPHTHALMOLOGY EXAM  09/05/1959  .  URINE MICROALBUMIN  09/05/1959  . TETANUS/TDAP  09/04/1968  . COLONOSCOPY  09/05/1999  . ZOSTAVAX  09/04/2009  . HEMOGLOBIN A1C  06/11/2013  . PNA vac Low Risk Adult (1 of 2 - PCV13) 09/04/2014  . INFLUENZA VACCINE  11/25/2016 (Originally 07/26/2016)    Allergies  Allergen Reactions  . Sulfa Antibiotics Rash      Medication List       Accurate as of 09/01/16 12:17 PM. Always use your most recent med list.          acetaminophen 325 MG tablet Commonly known as:  TYLENOL Take 650 mg by mouth every 6 (six) hours as needed.   albuterol (2.5 MG/3ML) 0.083% nebulizer solution Commonly known as:  PROVENTIL Take 2.5 mg by nebulization every 4 (four) hours as needed for wheezing or  shortness of breath.   amiodarone 200 MG tablet Commonly known as:  PACERONE Take 200 mg by mouth daily.   aspirin 81 MG chewable tablet Chew 81 mg by mouth every morning.   atorvastatin 80 MG tablet Commonly known as:  LIPITOR Take 80 mg by mouth daily.   docusate sodium 100 MG capsule Commonly known as:  COLACE Take 100 mg by mouth 2 (two) times daily.   HUMULIN N 100 UNIT/ML injection Generic drug:  insulin NPH Human Give 5 units subcutaneous twice a day   hydroxychloroquine 200 MG tablet Commonly known as:  PLAQUENIL Take 200 mg by mouth daily.   Melatonin 3 MG Tabs Take 2 tablets by mouth at bedtime   metoprolol succinate 25 MG 24 hr tablet Commonly known as:  TOPROL-XL Take 25 mg by mouth every morning.   nystatin cream Commonly known as:  MYCOSTATIN Mix with zinc oxide and apply to sacrum daily & prn   olopatadine 0.1 % ophthalmic solution Commonly known as:  PATANOL Place 1 drop into both eyes 2 (two) times daily.   omega-3 acid ethyl esters 1 g capsule Commonly known as:  LOVAZA Take 2 g by mouth 2 (two) times daily.   oxyCODONE 5 MG immediate release tablet Commonly known as:  Oxy IR/ROXICODONE Take 1 tablet (5 mg total) by mouth every 12 (twelve) hours as needed for severe pain.   OXYGEN O 2 at 2 LPM via n/c to keep O2 sat above 90 % every shift   polyethylene glycol powder powder Commonly known as:  MIRALAX Take 17 g by mouth 2 (two) times daily.   polyvinyl alcohol 1.4 % ophthalmic solution Commonly known as:  LIQUIFILM TEARS Place 1 drop into both eyes as needed for dry eyes.   predniSONE 2.5 MG tablet Commonly known as:  DELTASONE Take 3 tablets (7.5 mg total) by mouth daily with breakfast.   pregabalin 25 MG capsule Commonly known as:  LYRICA Take one capsule by mouth at bedtime   REFRESH P.M. OP Apply to eye. ONE APPLICATION BOTH EYES. For dry eyes not relieved by theratears every 6 hours   senna-docusate 8.6-50 MG  tablet Commonly known as:  Senokot-S Take 2 tablets by mouth daily as needed for mild constipation.   silver nitrate applicators 01-74 % applicator May apply silver nitrate applicator stick to epithelialized rounded borders of thigh wound daily as needed to promote wound healing once daily   tamsulosin 0.4 MG Caps capsule Commonly known as:  FLOMAX Take 0.4 mg by mouth at bedtime.   THERA-M ENHANCED PO 1 tablet by mouth daily   THERATEARS 0.25 % Soln Generic drug:  Carboxymethylcellulose Sodium Apply  2 drops to both eyes three times a day   thiamine 100 MG tablet Commonly known as:  VITAMIN B-1 Take 100 mg by mouth daily.   traZODone 50 MG tablet Commonly known as:  DESYREL Take 50 mg by mouth at bedtime.   vitamin C 500 MG tablet Commonly known as:  ASCORBIC ACID Take 500 mg by mouth daily.   Zinc Oxide 10 % Oint Mix with Nystatin and apply to sacrum daily & prn as ordered once a day   zinc sulfate 220 (50 Zn) MG capsule Take 220 mg by mouth daily.       Review of Systems  Constitutional: Positive for appetite change and fatigue. Negative for fever.  Respiratory: Positive for chest tightness and shortness of breath. Negative for apnea, cough and choking.   Cardiovascular: Negative for chest pain, palpitations and leg swelling.  Gastrointestinal: Negative for abdominal distention, abdominal pain, constipation and diarrhea.    Vitals:   09/01/16 1216  BP: 130/68  Pulse: 89  Resp: 20  Temp: 97.1 F (36.2 C)  TempSrc: Oral  SpO2: 97%   There is no height or weight on file to calculate BMI. Physical Exam  Constitutional: He is oriented to person, place, and time. He appears well-developed.  HENT:  Head: Normocephalic.  Mouth/Throat: Oropharynx is clear and moist.  Eyes: Pupils are equal, round, and reactive to light.  Cardiovascular: Normal rate, regular rhythm and normal heart sounds.   Pulmonary/Chest: Effort normal and breath sounds normal. No  respiratory distress.  Patient did have few rales in Left Lower Chest. Right was clear.  Abdominal: Soft. Bowel sounds are normal. He exhibits no distension. There is no tenderness. There is no rebound.  Musculoskeletal: He exhibits edema.  Neurological: He is alert and oriented to person, place, and time.    Labs reviewed: Basic Metabolic Panel:  Recent Labs  08/26/16 0509  08/27/16 0630  08/29/16 0634 08/30/16 0613 09/01/16 0735  NA 133*  < > 132*  132*  < > 130* 129* 131*  K 3.3*  < > 4.1  4.1  < > 4.7 4.8 4.8  CL 100*  < > 99*  98*  < > 96* 95* 94*  CO2 29  < > 29  30  < > 30 29 31   GLUCOSE 79  < > 140*  139*  < > 189* 109* 151*  BUN 16  < > 13  14  < > 27* 34* 43*  CREATININE 1.36*  < > 1.25*  1.28*  < > 1.84* 1.89* 2.06*  CALCIUM 8.0*  < > 7.8*  7.9*  < > 8.3* 8.3* 8.1*  MG 2.5*  --   --   --   --   --   --   PHOS  --   < > 3.5  --  4.4 4.3  --   < > = values in this interval not displayed. Liver Function Tests:  Recent Labs  08/09/16 0730 08/26/16 0509 08/26/16 0518 08/27/16 0630  AST 28 26  --  25  ALT 8* 14*  --  14*  ALKPHOS 320* 191*  --  211*  BILITOT 0.8 0.7  --  0.7  PROT 4.5* 5.0*  --  4.7*  ALBUMIN 1.8* 1.8* 1.9* 1.8*  1.8*     CBC:  Recent Labs  08/09/16 0730  08/17/16 1700 08/26/16 0509 08/27/16 0630 08/30/16 0613 09/01/16 0735  WBC 5.0  --  6.8 7.9 7.1 9.8 8.8  NEUTROABS  2.2  --  5.0 4.8  --   --   --   HGB 7.2*  < > 9.2* 9.3* 9.3* 10.2* 9.5*  HCT 23.2*  < > 29.9* 29.8* 29.9* 32.4* 29.9*  MCV 105.0*  --  101.0* 99.7 99.3 98.5 96.1  PLT 211  --  246 222 197 267 232  < > = values in this interval not displayed. Cardiac Enzymes:  Recent Labs  08/26/16 0509  TROPONINI 0.04*   BNP: Invalid input(s): POCBNP Lab Results  Component Value Date   HGBA1C 6.9 (H) 12/11/2012   Lab Results  Component Value Date   TSH 2.421 08/26/2016   Lab Results  Component Value Date   RKYHCWCB76 283 08/13/2014   Lab Results   Component Value Date   FOLATE >20.0 08/13/2014   Lab Results  Component Value Date   IRON 48 08/13/2014   TIBC 264 08/13/2014   FERRITIN 67 08/13/2014    Imaging and Procedures obtained prior to SNF admission: No results found.  Assessment/Plan  Chronic renal failure, stage 4 (severe)   Patient is both off dialysis and Lasix. Will weigh patient everyday POX Q shift CMP in few days to follow Creat. Patient has appointment with nephrologist next week.  Chronic combined systolic and diastolic congestive heart failure   Will continue to monitor weight and POX. Would use Lasix as needed.  Anemia of chronic disease Secondary to CRI.  HGB was 9.5 Follow in few days.  DM (diabetes mellitus), type 2 with neurological complications ([place])  Continue Accu Checks and low dose insulin  Rheumatoid arthritis  Continue [place]  Essential hypertension Patient was started on higher dose of Prednisone as his BP was running low. It has been doing good in facility. Will follow closely and try to taper off Prednisone to his dose of 5mg  QD.       Family/ staff Communication: D/W Wife  Labs/tests ordered: CBS and CMP.

## 2016-09-02 ENCOUNTER — Encounter (HOSPITAL_COMMUNITY): Payer: Self-pay | Admitting: *Deleted

## 2016-09-02 ENCOUNTER — Inpatient Hospital Stay (HOSPITAL_COMMUNITY)
Admission: EM | Admit: 2016-09-02 | Discharge: 2016-09-09 | DRG: 291 | Disposition: A | Discharge status: Skilled Nursing Facility | Payer: Medicare Other | Attending: Internal Medicine | Admitting: Internal Medicine

## 2016-09-02 ENCOUNTER — Emergency Department (HOSPITAL_COMMUNITY): Payer: Medicare Other

## 2016-09-02 ENCOUNTER — Non-Acute Institutional Stay (SKILLED_NURSING_FACILITY): Payer: Medicare Other | Admitting: Internal Medicine

## 2016-09-02 DIAGNOSIS — E8889 Other specified metabolic disorders: Secondary | ICD-10-CM | POA: Diagnosis present

## 2016-09-02 DIAGNOSIS — I5033 Acute on chronic diastolic (congestive) heart failure: Secondary | ICD-10-CM

## 2016-09-02 DIAGNOSIS — J90 Pleural effusion, not elsewhere classified: Secondary | ICD-10-CM | POA: Diagnosis present

## 2016-09-02 DIAGNOSIS — E1151 Type 2 diabetes mellitus with diabetic peripheral angiopathy without gangrene: Secondary | ICD-10-CM | POA: Diagnosis present

## 2016-09-02 DIAGNOSIS — I2510 Atherosclerotic heart disease of native coronary artery without angina pectoris: Secondary | ICD-10-CM | POA: Diagnosis present

## 2016-09-02 DIAGNOSIS — L89152 Pressure ulcer of sacral region, stage 2: Secondary | ICD-10-CM | POA: Diagnosis not present

## 2016-09-02 DIAGNOSIS — N179 Acute kidney failure, unspecified: Secondary | ICD-10-CM | POA: Diagnosis not present

## 2016-09-02 DIAGNOSIS — Z951 Presence of aortocoronary bypass graft: Secondary | ICD-10-CM

## 2016-09-02 DIAGNOSIS — L89159 Pressure ulcer of sacral region, unspecified stage: Secondary | ICD-10-CM | POA: Diagnosis present

## 2016-09-02 DIAGNOSIS — R0600 Dyspnea, unspecified: Secondary | ICD-10-CM | POA: Diagnosis not present

## 2016-09-02 DIAGNOSIS — J810 Acute pulmonary edema: Secondary | ICD-10-CM | POA: Diagnosis not present

## 2016-09-02 DIAGNOSIS — R05 Cough: Secondary | ICD-10-CM | POA: Diagnosis not present

## 2016-09-02 DIAGNOSIS — R064 Hyperventilation: Secondary | ICD-10-CM

## 2016-09-02 DIAGNOSIS — R1310 Dysphagia, unspecified: Secondary | ICD-10-CM | POA: Diagnosis present

## 2016-09-02 DIAGNOSIS — Z794 Long term (current) use of insulin: Secondary | ICD-10-CM

## 2016-09-02 DIAGNOSIS — Z833 Family history of diabetes mellitus: Secondary | ICD-10-CM

## 2016-09-02 DIAGNOSIS — M549 Dorsalgia, unspecified: Secondary | ICD-10-CM | POA: Diagnosis present

## 2016-09-02 DIAGNOSIS — E1129 Type 2 diabetes mellitus with other diabetic kidney complication: Secondary | ICD-10-CM

## 2016-09-02 DIAGNOSIS — R9431 Abnormal electrocardiogram [ECG] [EKG]: Secondary | ICD-10-CM | POA: Diagnosis present

## 2016-09-02 DIAGNOSIS — Z7952 Long term (current) use of systemic steroids: Secondary | ICD-10-CM

## 2016-09-02 DIAGNOSIS — Z823 Family history of stroke: Secondary | ICD-10-CM

## 2016-09-02 DIAGNOSIS — I509 Heart failure, unspecified: Secondary | ICD-10-CM

## 2016-09-02 DIAGNOSIS — I130 Hypertensive heart and chronic kidney disease with heart failure and stage 1 through stage 4 chronic kidney disease, or unspecified chronic kidney disease: Principal | ICD-10-CM | POA: Diagnosis present

## 2016-09-02 DIAGNOSIS — G92 Toxic encephalopathy: Secondary | ICD-10-CM | POA: Diagnosis not present

## 2016-09-02 DIAGNOSIS — G8929 Other chronic pain: Secondary | ICD-10-CM | POA: Diagnosis present

## 2016-09-02 DIAGNOSIS — I5043 Acute on chronic combined systolic (congestive) and diastolic (congestive) heart failure: Secondary | ICD-10-CM | POA: Diagnosis present

## 2016-09-02 DIAGNOSIS — Z7982 Long term (current) use of aspirin: Secondary | ICD-10-CM

## 2016-09-02 DIAGNOSIS — E875 Hyperkalemia: Secondary | ICD-10-CM | POA: Diagnosis present

## 2016-09-02 DIAGNOSIS — Z89512 Acquired absence of left leg below knee: Secondary | ICD-10-CM

## 2016-09-02 DIAGNOSIS — R0602 Shortness of breath: Secondary | ICD-10-CM | POA: Diagnosis present

## 2016-09-02 DIAGNOSIS — Y95 Nosocomial condition: Secondary | ICD-10-CM | POA: Diagnosis present

## 2016-09-02 DIAGNOSIS — M069 Rheumatoid arthritis, unspecified: Secondary | ICD-10-CM | POA: Diagnosis present

## 2016-09-02 DIAGNOSIS — J189 Pneumonia, unspecified organism: Secondary | ICD-10-CM

## 2016-09-02 DIAGNOSIS — N183 Chronic kidney disease, stage 3 (moderate): Secondary | ICD-10-CM

## 2016-09-02 DIAGNOSIS — I10 Essential (primary) hypertension: Secondary | ICD-10-CM

## 2016-09-02 DIAGNOSIS — D638 Anemia in other chronic diseases classified elsewhere: Secondary | ICD-10-CM | POA: Diagnosis present

## 2016-09-02 DIAGNOSIS — Z8249 Family history of ischemic heart disease and other diseases of the circulatory system: Secondary | ICD-10-CM

## 2016-09-02 DIAGNOSIS — I4581 Long QT syndrome: Secondary | ICD-10-CM | POA: Diagnosis present

## 2016-09-02 DIAGNOSIS — E1140 Type 2 diabetes mellitus with diabetic neuropathy, unspecified: Secondary | ICD-10-CM | POA: Diagnosis present

## 2016-09-02 DIAGNOSIS — E119 Type 2 diabetes mellitus without complications: Secondary | ICD-10-CM

## 2016-09-02 DIAGNOSIS — E871 Hypo-osmolality and hyponatremia: Secondary | ICD-10-CM | POA: Diagnosis not present

## 2016-09-02 DIAGNOSIS — E1122 Type 2 diabetes mellitus with diabetic chronic kidney disease: Secondary | ICD-10-CM | POA: Diagnosis present

## 2016-09-02 DIAGNOSIS — R569 Unspecified convulsions: Secondary | ICD-10-CM

## 2016-09-02 DIAGNOSIS — Z79899 Other long term (current) drug therapy: Secondary | ICD-10-CM

## 2016-09-02 HISTORY — DX: Pleural effusion, not elsewhere classified: J90

## 2016-09-02 HISTORY — DX: Acute on chronic combined systolic (congestive) and diastolic (congestive) heart failure: I50.43

## 2016-09-02 LAB — CBC WITH DIFFERENTIAL/PLATELET
BASOS ABS: 0 10*3/uL (ref 0.0–0.1)
BASOS PCT: 0 %
EOS ABS: 0.1 10*3/uL (ref 0.0–0.7)
EOS PCT: 1 %
HCT: 30.5 % — ABNORMAL LOW (ref 39.0–52.0)
Hemoglobin: 9.8 g/dL — ABNORMAL LOW (ref 13.0–17.0)
LYMPHS PCT: 9 %
Lymphs Abs: 0.9 10*3/uL (ref 0.7–4.0)
MCH: 30.9 pg (ref 26.0–34.0)
MCHC: 32.1 g/dL (ref 30.0–36.0)
MCV: 96.2 fL (ref 78.0–100.0)
MONO ABS: 0.4 10*3/uL (ref 0.1–1.0)
Monocytes Relative: 4 %
Neutro Abs: 8.6 10*3/uL — ABNORMAL HIGH (ref 1.7–7.7)
Neutrophils Relative %: 86 %
PLATELETS: 248 10*3/uL (ref 150–400)
RBC: 3.17 MIL/uL — ABNORMAL LOW (ref 4.22–5.81)
RDW: 17.2 % — AB (ref 11.5–15.5)
WBC: 10.1 10*3/uL (ref 4.0–10.5)

## 2016-09-02 LAB — BASIC METABOLIC PANEL
ANION GAP: 9 (ref 5–15)
BUN: 48 mg/dL — AB (ref 6–20)
CALCIUM: 8.4 mg/dL — AB (ref 8.9–10.3)
CO2: 29 mmol/L (ref 22–32)
Chloride: 91 mmol/L — ABNORMAL LOW (ref 101–111)
Creatinine, Ser: 2.11 mg/dL — ABNORMAL HIGH (ref 0.61–1.24)
GFR calc Af Amer: 36 mL/min — ABNORMAL LOW (ref >60)
GFR, EST NON AFRICAN AMERICAN: 31 mL/min — AB (ref >60)
GLUCOSE: 227 mg/dL — AB (ref 65–99)
Potassium: 5.8 mmol/L — ABNORMAL HIGH (ref 3.5–5.1)
SODIUM: 129 mmol/L — AB (ref 135–145)

## 2016-09-02 LAB — POTASSIUM
Potassium: 6.1 mmol/L — ABNORMAL HIGH (ref 3.5–5.1)
Potassium: 6.2 mmol/L — ABNORMAL HIGH (ref 3.5–5.1)

## 2016-09-02 LAB — GLUCOSE, CAPILLARY
GLUCOSE-CAPILLARY: 219 mg/dL — AB (ref 65–99)
Glucose-Capillary: 221 mg/dL — ABNORMAL HIGH (ref 65–99)

## 2016-09-02 LAB — BRAIN NATRIURETIC PEPTIDE: B Natriuretic Peptide: >4500 pg/mL — ABNORMAL HIGH (ref 0.0–100.0)

## 2016-09-02 MED ORDER — HYDROXYCHLOROQUINE SULFATE 200 MG PO TABS
200.0000 mg | ORAL_TABLET | Freq: Every day | ORAL | Status: DC
  Administered 2016-09-04 – 2016-09-09 (×5): 200 mg via ORAL
  Filled 2016-09-02 (×10): qty 1

## 2016-09-02 MED ORDER — INSULIN ASPART 100 UNIT/ML ~~LOC~~ SOLN
0.0000 [IU] | Freq: Three times a day (TID) | SUBCUTANEOUS | Status: DC
  Administered 2016-09-03: 5 [IU] via SUBCUTANEOUS
  Administered 2016-09-03 – 2016-09-06 (×5): 2 [IU] via SUBCUTANEOUS
  Administered 2016-09-06 – 2016-09-07 (×2): 3 [IU] via SUBCUTANEOUS
  Administered 2016-09-07: 7 [IU] via SUBCUTANEOUS
  Administered 2016-09-08 – 2016-09-09 (×4): 2 [IU] via SUBCUTANEOUS

## 2016-09-02 MED ORDER — FUROSEMIDE 10 MG/ML IJ SOLN
100.0000 mg | Freq: Once | INTRAMUSCULAR | Status: AC
  Administered 2016-09-02: 100 mg via INTRAVENOUS
  Filled 2016-09-02: qty 12

## 2016-09-02 MED ORDER — LORAZEPAM 2 MG/ML IJ SOLN
1.0000 mg | Freq: Four times a day (QID) | INTRAMUSCULAR | Status: DC | PRN
  Administered 2016-09-02: 1 mg via INTRAVENOUS
  Filled 2016-09-02: qty 1

## 2016-09-02 MED ORDER — AMIODARONE HCL 200 MG PO TABS
200.0000 mg | ORAL_TABLET | Freq: Every day | ORAL | Status: DC
  Administered 2016-09-04 – 2016-09-08 (×4): 200 mg via ORAL
  Filled 2016-09-02 (×6): qty 1

## 2016-09-02 MED ORDER — IPRATROPIUM-ALBUTEROL 0.5-2.5 (3) MG/3ML IN SOLN
3.0000 mL | RESPIRATORY_TRACT | Status: DC
  Administered 2016-09-03 – 2016-09-05 (×6): 3 mL via RESPIRATORY_TRACT
  Filled 2016-09-02 (×9): qty 3

## 2016-09-02 MED ORDER — SODIUM CHLORIDE 0.9% FLUSH
3.0000 mL | Freq: Two times a day (BID) | INTRAVENOUS | Status: DC
  Administered 2016-09-04 – 2016-09-08 (×8): 3 mL via INTRAVENOUS

## 2016-09-02 MED ORDER — DEXTROSE 50 % IV SOLN
25.0000 mL | Freq: Once | INTRAVENOUS | Status: DC
Start: 2016-09-02 — End: 2016-09-09

## 2016-09-02 MED ORDER — HEPARIN SODIUM (PORCINE) 5000 UNIT/ML IJ SOLN
5000.0000 [IU] | Freq: Three times a day (TID) | INTRAMUSCULAR | Status: DC
  Administered 2016-09-02 – 2016-09-09 (×19): 5000 [IU] via SUBCUTANEOUS
  Filled 2016-09-02 (×19): qty 1

## 2016-09-02 MED ORDER — SODIUM CHLORIDE 0.9 % IV BOLUS (SEPSIS)
500.0000 mL | Freq: Once | INTRAVENOUS | Status: AC
  Administered 2016-09-02: 500 mL via INTRAVENOUS

## 2016-09-02 MED ORDER — PREGABALIN 25 MG PO CAPS
25.0000 mg | ORAL_CAPSULE | Freq: Every day | ORAL | Status: DC
  Administered 2016-09-02 – 2016-09-08 (×7): 25 mg via ORAL
  Filled 2016-09-02 (×7): qty 1

## 2016-09-02 MED ORDER — STERILE DILUENT FOR HUMULIN INSULINS: 10.0000 [IU] | Freq: Once | SUBCUTANEOUS | Status: DC

## 2016-09-02 MED ORDER — ORAL CARE MOUTH RINSE
15.0000 mL | Freq: Two times a day (BID) | OROMUCOSAL | Status: DC
  Administered 2016-09-04 – 2016-09-09 (×8): 15 mL via OROMUCOSAL

## 2016-09-02 MED ORDER — DOCUSATE SODIUM 100 MG PO CAPS
100.0000 mg | ORAL_CAPSULE | Freq: Two times a day (BID) | ORAL | Status: DC
  Administered 2016-09-02 – 2016-09-09 (×11): 100 mg via ORAL
  Filled 2016-09-02 (×14): qty 1

## 2016-09-02 MED ORDER — IPRATROPIUM-ALBUTEROL 0.5-2.5 (3) MG/3ML IN SOLN
3.0000 mL | Freq: Once | RESPIRATORY_TRACT | Status: AC
  Administered 2016-09-02: 3 mL via RESPIRATORY_TRACT
  Filled 2016-09-02: qty 3

## 2016-09-02 MED ORDER — SODIUM POLYSTYRENE SULFONATE 15 GM/60ML PO SUSP
15.0000 g | Freq: Once | ORAL | Status: AC
  Administered 2016-09-02: 15 g via ORAL
  Filled 2016-09-02: qty 60

## 2016-09-02 MED ORDER — DEXTROSE 5 % IV SOLN
2.0000 g | Freq: Once | INTRAVENOUS | Status: AC
  Administered 2016-09-03: 2 g via INTRAVENOUS

## 2016-09-02 MED ORDER — CHLORHEXIDINE GLUCONATE 0.12 % MT SOLN
15.0000 mL | Freq: Two times a day (BID) | OROMUCOSAL | Status: DC
  Administered 2016-09-03 – 2016-09-09 (×9): 15 mL via OROMUCOSAL
  Filled 2016-09-02 (×11): qty 15

## 2016-09-02 MED ORDER — VITAMIN B-1 100 MG PO TABS
100.0000 mg | ORAL_TABLET | Freq: Every day | ORAL | Status: DC
Start: 2016-09-03 — End: 2016-09-09
  Administered 2016-09-04 – 2016-09-09 (×5): 100 mg via ORAL
  Filled 2016-09-02 (×7): qty 1

## 2016-09-02 MED ORDER — METHYLPREDNISOLONE SODIUM SUCC 125 MG IJ SOLR
125.0000 mg | Freq: Once | INTRAMUSCULAR | Status: AC
  Administered 2016-09-02: 125 mg via INTRAVENOUS
  Filled 2016-09-02: qty 2

## 2016-09-02 MED ORDER — POLYETHYLENE GLYCOL 3350 17 G PO PACK
17.0000 g | PACK | Freq: Two times a day (BID) | ORAL | Status: DC
  Administered 2016-09-02 – 2016-09-08 (×10): 17 g via ORAL
  Filled 2016-09-02 (×13): qty 1

## 2016-09-02 MED ORDER — POLYVINYL ALCOHOL 1.4 % OP SOLN
2.0000 [drp] | Freq: Three times a day (TID) | OPHTHALMIC | Status: DC
  Administered 2016-09-03 – 2016-09-09 (×15): 2 [drp] via OPHTHALMIC
  Filled 2016-09-02 (×4): qty 15

## 2016-09-02 MED ORDER — ARTIFICIAL TEARS OP OINT
TOPICAL_OINTMENT | Freq: Every day | OPHTHALMIC | Status: DC | PRN
  Filled 2016-09-02: qty 3.5

## 2016-09-02 MED ORDER — POLYETHYLENE GLYCOL 3350 17 GM/SCOOP PO POWD
17.0000 g | Freq: Two times a day (BID) | ORAL | Status: DC
Start: 2016-09-02 — End: 2016-09-02

## 2016-09-02 MED ORDER — ATORVASTATIN CALCIUM 40 MG PO TABS
80.0000 mg | ORAL_TABLET | Freq: Every day | ORAL | Status: DC
  Administered 2016-09-02 – 2016-09-08 (×6): 80 mg via ORAL
  Filled 2016-09-02 (×5): qty 2

## 2016-09-02 MED ORDER — TAMSULOSIN HCL 0.4 MG PO CAPS
0.4000 mg | ORAL_CAPSULE | Freq: Every day | ORAL | Status: DC
  Administered 2016-09-02 – 2016-09-08 (×7): 0.4 mg via ORAL
  Filled 2016-09-02 (×7): qty 1

## 2016-09-02 MED ORDER — ZINC SULFATE 220 (50 ZN) MG PO CAPS
220.0000 mg | ORAL_CAPSULE | Freq: Every day | ORAL | Status: DC
  Administered 2016-09-04 – 2016-09-09 (×5): 220 mg via ORAL
  Filled 2016-09-02 (×7): qty 1

## 2016-09-02 MED ORDER — FUROSEMIDE 10 MG/ML IJ SOLN
INTRAMUSCULAR | Status: AC
  Filled 2016-09-02: qty 4

## 2016-09-02 MED ORDER — VITAMIN C 500 MG PO TABS
500.0000 mg | ORAL_TABLET | Freq: Every day | ORAL | Status: DC
Start: 2016-09-03 — End: 2016-09-09
  Administered 2016-09-04 – 2016-09-09 (×5): 500 mg via ORAL
  Filled 2016-09-02 (×7): qty 1

## 2016-09-02 MED ORDER — SODIUM CHLORIDE 0.9 % IV SOLN
1.0000 g | Freq: Once | INTRAVENOUS | Status: AC
  Administered 2016-09-02: 1 g via INTRAVENOUS
  Filled 2016-09-02: qty 10

## 2016-09-02 MED ORDER — SODIUM CHLORIDE 0.9 % IV SOLN: 250.0000 mL | INTRAVENOUS | Status: DC | PRN

## 2016-09-02 MED ORDER — OXYCODONE HCL 5 MG PO TABS
5.0000 mg | ORAL_TABLET | Freq: Four times a day (QID) | ORAL | Status: DC | PRN
  Administered 2016-09-04 – 2016-09-09 (×12): 5 mg via ORAL
  Filled 2016-09-02 (×15): qty 1

## 2016-09-02 MED ORDER — VANCOMYCIN HCL 10 G IV SOLR
1500.0000 mg | Freq: Once | INTRAVENOUS | Status: AC
  Administered 2016-09-02: 1500 mg via INTRAVENOUS
  Filled 2016-09-02: qty 1500

## 2016-09-02 MED ORDER — ONDANSETRON HCL 4 MG/2ML IJ SOLN
4.0000 mg | Freq: Four times a day (QID) | INTRAMUSCULAR | Status: DC | PRN
Start: 2016-09-02 — End: 2016-09-09

## 2016-09-02 MED ORDER — OLOPATADINE HCL 0.1 % OP SOLN
1.0000 [drp] | Freq: Two times a day (BID) | OPHTHALMIC | Status: DC
  Administered 2016-09-02 – 2016-09-09 (×12): 1 [drp] via OPHTHALMIC
  Filled 2016-09-02: qty 5

## 2016-09-02 MED ORDER — ACETAMINOPHEN 325 MG PO TABS
650.0000 mg | ORAL_TABLET | Freq: Four times a day (QID) | ORAL | Status: DC | PRN
  Administered 2016-09-06: 650 mg via ORAL
  Filled 2016-09-02: qty 2

## 2016-09-02 MED ORDER — METOPROLOL SUCCINATE ER 25 MG PO TB24
25.0000 mg | ORAL_TABLET | Freq: Every morning | ORAL | Status: DC
  Administered 2016-09-04 – 2016-09-09 (×5): 25 mg via ORAL
  Filled 2016-09-02 (×7): qty 1

## 2016-09-02 MED ORDER — IPRATROPIUM-ALBUTEROL 0.5-2.5 (3) MG/3ML IN SOLN
3.0000 mL | RESPIRATORY_TRACT | Status: DC | PRN
  Filled 2016-09-02: qty 3

## 2016-09-02 MED ORDER — PREDNISONE 5 MG PO TABS
7.5000 mg | ORAL_TABLET | Freq: Every day | ORAL | Status: DC
  Filled 2016-09-02 (×3): qty 1

## 2016-09-02 MED ORDER — FUROSEMIDE 10 MG/ML IJ SOLN
INTRAMUSCULAR | Status: AC
  Filled 2016-09-02: qty 2

## 2016-09-02 MED ORDER — INSULIN GLARGINE 100 UNIT/ML ~~LOC~~ SOLN
5.0000 [IU] | Freq: Every day | SUBCUTANEOUS | Status: DC
  Administered 2016-09-02: 5 [IU] via SUBCUTANEOUS
  Filled 2016-09-02 (×3): qty 0.05

## 2016-09-02 MED ORDER — ASPIRIN 81 MG PO CHEW
81.0000 mg | CHEWABLE_TABLET | Freq: Every morning | ORAL | Status: DC
  Administered 2016-09-04 – 2016-09-09 (×5): 81 mg via ORAL
  Filled 2016-09-02 (×7): qty 1

## 2016-09-02 MED ORDER — SODIUM CHLORIDE 0.9% FLUSH: 3.0000 mL | INTRAVENOUS | Status: DC | PRN

## 2016-09-02 MED ORDER — OMEGA-3-ACID ETHYL ESTERS 1 G PO CAPS
2.0000 g | ORAL_CAPSULE | Freq: Two times a day (BID) | ORAL | Status: DC
  Administered 2016-09-02 – 2016-09-09 (×11): 2 g via ORAL
  Filled 2016-09-02 (×13): qty 2

## 2016-09-02 MED ORDER — PRO-STAT SUGAR FREE PO LIQD
30.0000 mL | Freq: Two times a day (BID) | ORAL | Status: DC
  Administered 2016-09-02 – 2016-09-04 (×3): 30 mL via ORAL
  Administered 2016-09-04 – 2016-09-05 (×2): via ORAL
  Administered 2016-09-06 – 2016-09-09 (×7): 30 mL via ORAL
  Filled 2016-09-02 (×14): qty 30

## 2016-09-02 MED ORDER — TRAZODONE HCL 50 MG PO TABS
50.0000 mg | ORAL_TABLET | Freq: Every day | ORAL | Status: DC
  Administered 2016-09-02: 50 mg via ORAL
  Filled 2016-09-02: qty 1

## 2016-09-02 NOTE — ED Provider Notes (Signed)
[facility] DEPT Provider Note   CSN: [id number] Arrival date & time: 09/02/16  1427     History   Chief Complaint Chief Complaint  Patient presents with  . Shortness of Breath    HPI [name] is a 65 y.o. male.  Level V caveat for urgent need for intervention. Dyspnea and productive cough for several days. Patient was recently admitted to the hospital and required intravenous Lasix and dialysis. He has a Foley catheter place which is draining urine. Sputum is yellowish brown in color. No fever, sweats, chills, chest pain.      Past Medical History:  Diagnosis Date  . Chronic back pain   . Chronic neck pain   . Coronary artery disease 1993   angioplasty after AMI  . Diabetes mellitus   . Diabetic neuropathy ([place])   . Hyperkalemia   . Hypertension   . Pancytopenia ([place]) 08/13/2014  . 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  . Rheumatoid arthritis(714.0)   . Seizure ([place]) 08/13/2014   Pt denies    Patient Active Problem List   Diagnosis Date Noted  . Pressure ulcer 08/27/2016  . ESRD needing dialysis ([place]) 08/26/2016  . Acute hematogenous osteomyelitis of left foot ([place]) 08/09/2016  . Cellulitis and abscess of lower extremity 05/29/2016  . Anemia of chronic disease 04/27/2016  . 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 Bradycardia 08/14/2014  . Prolonged QT interval 08/14/2014  . Gait disorder 08/14/2014  . Seizure ([place]) 08/13/2014  . Pancytopenia ([place]) 08/13/2014  . ARF (acute renal failure) ([place]) 08/13/2014  . Altered mental status 06/18/2014  . Closed dislocation of metatarsophalangeal (joint) 03/26/2013  . DM (diabetes mellitus), type 2 with neurological complications ([place]) 40/34/7425  . Osteomyelitis of metatarsal ([place]) 12/11/2012  . Hyperlipidemia 12/11/2012  . Coronary artery disease   . Rheumatoid arthritis ([place])   . Hypertension      Past Surgical History:  Procedure Laterality Date  . CARDIAC SURGERY    . FRACTURE SURGERY    . JOINT REPLACEMENT         Home Medications    Prior to Admission medications   Medication Sig Start Date End Date Taking? Authorizing Provider  acetaminophen (TYLENOL) 325 MG tablet Take 650 mg by mouth every 6 (six) hours as needed.    Historical Provider, MD  albuterol (PROVENTIL) (2.5 MG/3ML) 0.083% nebulizer solution Take 2.5 mg by nebulization every 4 (four) hours as needed for wheezing or shortness of breath.    Historical Provider, MD  amiodarone (PACERONE) 200 MG tablet Take 200 mg by mouth daily.    Historical Provider, MD  Artificial Tear Ointment (REFRESH P.M. OP) Apply to eye. ONE APPLICATION BOTH EYES. For dry eyes not relieved by theratears every 6 hours    Historical Provider, MD  aspirin 81 MG chewable tablet Chew 81 mg by mouth every morning.     Historical Provider, MD  atorvastatin (LIPITOR) 80 MG tablet Take 80 mg by mouth daily.    Historical Provider, MD  Carboxymethylcellulose Sodium (THERATEARS) 0.25 % SOLN Apply 2 drops to both eyes three times a day    Historical Provider, MD  docusate sodium (COLACE) 100 MG capsule Take 100 mg by mouth 2 (two) times daily.    Historical Provider, MD  hydroxychloroquine (PLAQUENIL) 200 MG tablet Take 200 mg by mouth daily.     Historical Provider, MD  insulin  NPH Human (HUMULIN N) 100 UNIT/ML injection Give 5 units subcutaneous twice a day    Historical Provider, MD  Melatonin 3 MG TABS Take 2 tablets by mouth at bedtime    Historical Provider, MD  metoprolol succinate (TOPROL-XL) 25 MG 24 hr tablet Take 25 mg by mouth every morning.    Historical Provider, MD  Multiple Vitamins-Minerals (THERA-M ENHANCED PO) 1 tablet by mouth daily    Historical Provider, MD  nystatin cream (MYCOSTATIN) Mix with zinc oxide and apply to sacrum daily & prn    Historical Provider, MD  olopatadine (PATANOL) 0.1 % ophthalmic solution Place 1 drop  into both eyes 2 (two) times daily.    Historical Provider, MD  omega-3 acid ethyl esters (LOVAZA) 1 g capsule Take 2 g by mouth 2 (two) times daily.    Historical Provider, MD  oxyCODONE (OXY IR/ROXICODONE) 5 MG immediate release tablet Take 1 tablet (5 mg total) by mouth every 12 (twelve) hours as needed for severe pain. 08/31/16   Gildardo Cranker, DO  OXYGEN O 2 at 2 LPM via n/c to keep O2 sat above 90 % every shift    Historical Provider, MD  polyethylene glycol powder (MIRALAX) powder Take 17 g by mouth 2 (two) times daily. 08/14/14   Rexene Alberts, MD  polyvinyl alcohol (LIQUIFILM TEARS) 1.4 % ophthalmic solution Place 1 drop into both eyes as needed for dry eyes.    Historical Provider, MD  predniSONE (DELTASONE) 2.5 MG tablet Take 3 tablets (7.5 mg total) by mouth daily with breakfast. 08/28/16   Orvan Falconer, MD  pregabalin (LYRICA) 25 MG capsule Take one capsule by mouth at bedtime 08/31/16   Gildardo Cranker, DO  senna-docusate (SENOKOT-S) 8.6-50 MG tablet Take 2 tablets by mouth daily as needed for mild constipation.    Historical Provider, MD  silver nitrate applicators 97-98 % applicator May apply silver nitrate applicator stick to epithelialized rounded borders of thigh wound daily as needed to promote wound healing once daily    Historical Provider, MD  Tamsulosin HCl (FLOMAX) 0.4 MG CAPS Take 0.4 mg by mouth at bedtime.     Historical Provider, MD  thiamine (VITAMIN B-1) 100 MG tablet Take 100 mg by mouth daily.    Historical Provider, MD  traZODone (DESYREL) 50 MG tablet Take 50 mg by mouth at bedtime.    Historical Provider, MD  vitamin C (ASCORBIC ACID) 500 MG tablet Take 500 mg by mouth daily.    Historical Provider, MD  Zinc Oxide 10 % OINT Mix with Nystatin and apply to sacrum daily & prn as ordered once a day    Historical Provider, MD  zinc sulfate 220 (50 Zn) MG capsule Take 220 mg by mouth daily.    Historical Provider, MD    Family History Family History  Problem Relation Age of Onset   . Diabetes Father   . Dementia Father   . Heart attack Brother     multiple brothers with MIs  . Hypertension Mother   . Stroke Mother     Social History Social History  Substance Use Topics  . Smoking status: Never Smoker  . Smokeless tobacco: Never Used  . Alcohol use No     Allergies   Sulfa antibiotics   Review of Systems Review of Systems  Reason unable to perform ROS: Urgent need for intervention.     Physical Exam Updated Vital Signs BP 137/98 (BP Location: Right Arm)   Pulse 87   Temp 99.5  F (37.5 C) (Rectal)   Resp 15   Ht 5\' 9"  (1.753 m)   Wt 185 lb (83.9 kg)   SpO2 100%   BMI 27.32 kg/m   Physical Exam  Constitutional: He is oriented to person, place, and time.  Pale, coughing  HENT:  Head: Normocephalic and atraumatic.  Eyes: Conjunctivae are normal.  Neck: Neck supple.  Cardiovascular: Normal rate and regular rhythm.   Pulmonary/Chest:  Decreased breath sounds bilaterally at bases.  Abdominal: Soft. Bowel sounds are normal.  Musculoskeletal: Normal range of motion.  Neurological: He is alert and oriented to person, place, and time.  Skin: Skin is warm and dry.  Psychiatric:  Flat affect  Nursing note and vitals reviewed.    ED Treatments / Results  Labs (all labs ordered are listed, but only abnormal results are displayed) Labs Reviewed  CBC WITH DIFFERENTIAL/PLATELET - Abnormal; Notable for the following:       Result Value   RBC 3.17 (*)    Hemoglobin 9.8 (*)    HCT 30.5 (*)    RDW 17.2 (*)    Neutro Abs 8.6 (*)    All other components within normal limits  BASIC METABOLIC PANEL - Abnormal; Notable for the following:    Sodium 129 (*)    Potassium 5.8 (*)    Chloride 91 (*)    Glucose, Bld 227 (*)    BUN 48 (*)    Creatinine, Ser 2.11 (*)    Calcium 8.4 (*)    GFR calc non Af Amer 31 (*)    GFR calc Af Amer 36 (*)    All other components within normal limits    EKG  EKG Interpretation None        Radiology Dg Chest Portable 1 View  Result Date: 09/02/2016 CLINICAL DATA:  Cough. EXAM: PORTABLE CHEST 1 VIEW COMPARISON:  08/26/2016. FINDINGS: Dialysis catheter in stable position. Prior CABG. Cardiomegaly with diffuse bilateral pulmonary infiltrates again noted. Right pleural effusion. Findings consistent congestive heart failure. Similar findings noted on prior exam. IMPRESSION: 1. Dialysis catheter in stable position. 2. Prior CABG. Cardiomegaly with bilateral pulmonary infiltrates consistent with pulmonary edema. Right pleural effusion. Findings consistent with congestive heart failure. Bilateral pneumonia cannot be excluded. Electronically Signed   By: Marcello Moores  Register   On: 09/02/2016 17:01    Procedures Procedures (including critical care time)  Medications Ordered in ED Medications  furosemide (LASIX) injection 100 mg (not administered)  ipratropium-albuterol (DUONEB) 0.5-2.5 (3) MG/3ML nebulizer solution 3 mL (3 mLs Nebulization Given 09/02/16 1602)  sodium chloride 0.9 % bolus 500 mL (500 mLs Intravenous New Bag/Given 09/02/16 1545)  methylPREDNISolone sodium succinate (SOLU-MEDROL) 125 mg/2 mL injection 125 mg (125 mg Intravenous Given 09/02/16 1650)     Initial Impression / Assessment and Plan / ED Course  I have reviewed the triage vital signs and the nursing notes.  Pertinent labs & imaging results that were available during my care of the patient were reviewed by me and considered in my medical decision making (see chart for details).  Clinical Course    Discussed with nephrologist Dr.Befekadu.  He did not think patient needed to be dialyzed at this time. He recommended Lasix 100 mg IV twice a day. Will also start antibiotics for healthcare associated pneumonia. Admit to general medicine.    CRITICAL CARE Performed by: Nat Christen  ?  Total critical care time: 30 minutes  Critical care time was exclusive of separately billable procedures and treating other  patients.  Critical care was necessary to treat or prevent imminent or life-threatening deterioration.  Critical care was time spent personally by me on the following activities: development of treatment plan with patient and/or surrogate as well as nursing, discussions with consultants, evaluation of patient's response to treatment, examination of patient, obtaining history from patient or surrogate, ordering and performing treatments and interventions, ordering and review of laboratory studies, ordering and review of radiographic studies, pulse oximetry and re-evaluation of patient's condition. Final Clinical Impressions(s) / ED Diagnoses   Final diagnoses:  Acute pulmonary edema [facility])    New Prescriptions New Prescriptions   No medications on file     Nat Christen, MD 09/02/16 0156

## 2016-09-02 NOTE — ED Triage Notes (Signed)
Sent from the [facility]

## 2016-09-02 NOTE — Progress Notes (Signed)
This is an acute visit.  Level of care skilled.  Facility [facility].  Chief complaint acute visit secondary to increased chest congestion labored breathing.  History of present illness.  Patient is a pleasant 65 year old male with a complicated medical history--he was just readmitted to skilled nursing from the hospital-she was found to be in CHF and required dialysis as well with almost 3.6 L of fluid removed.  He was started on Lasix.  He has successful diuresis was feeling better decided he did not need Lasix or dialysis presently.  He has somewhat chronic complaints of shortness of breath but this appears to be increased today he appears to be uncomfortable although not in acute distress he has an elevated respiratory rate.  He also is somewhat anxious.  Past Medical History:  Diagnosis Date  . Chronic back pain   . Chronic neck pain   . Coronary artery disease 1993   angioplasty after AMI  . Diabetes mellitus   . Diabetic neuropathy ([place])   . Hyperkalemia   . Hypertension   . Pancytopenia ([place]) 08/13/2014  . 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  . Rheumatoid arthritis(714.0)   . Seizure ([place]) 08/13/2014   Pt denies        Past Surgical History:  Procedure Laterality Date  . CARDIAC SURGERY    . FRACTURE SURGERY    . JOINT REPLACEMENT      reports that he has never smoked. He has never used smokeless tobacco. He reports that he does not drink alcohol or use drugs. Social History        Social History  . Marital status: Married    Spouse name: N/A  . Number of children: N/A  . Years of education: N/A      Occupational History  . Not on file.       Social History Main Topics  . Smoking status: Never Smoker  . Smokeless tobacco: Never Used  . Alcohol use No  . Drug use: No  . Sexual activity: Not on file       Other Topics Concern  . Not on file      Social History  Narrative  . No narrative on file    Functional Status Survey:          Family History  Problem Relation Age of Onset  . Diabetes Father   . Dementia Father   . Heart attack Brother     multiple brothers with MIs  . Hypertension Mother   . Stroke Mother         Health Maintenance  Topic Date Due  . Hepatitis C Screening  [birth year]1949  . FOOT EXAM  09/05/1959  . OPHTHALMOLOGY EXAM  09/05/1959  . URINE MICROALBUMIN  09/05/1959  . TETANUS/TDAP  09/04/1968  . COLONOSCOPY  09/05/1999  . ZOSTAVAX  09/04/2009  . HEMOGLOBIN A1C  06/11/2013  . PNA vac Low Risk Adult (1 of 2 - PCV13) 09/04/2014  . INFLUENZA VACCINE  11/25/2016 (Originally 07/26/2016)        Allergies  Allergen Reactions  . Sulfa Antibiotics Rash          Medication List                       acetaminophen 325 MG tablet Commonly known as:  TYLENOL Take 650 mg by mouth every 6 (six) hours as needed.  albuterol (2.5 MG/3ML) 0.083% nebulizer solution Commonly  known as:  PROVENTIL Take 2.5 mg by nebulization every 4 (four) hours as needed for wheezing or shortness of breath.  amiodarone 200 MG tablet Commonly known as:  PACERONE Take 200 mg by mouth daily.  aspirin 81 MG chewable tablet Chew 81 mg by mouth every morning.  atorvastatin 80 MG tablet Commonly known as:  LIPITOR Take 80 mg by mouth daily.  docusate sodium 100 MG capsule Commonly known as:  COLACE Take 100 mg by mouth 2 (two) times daily.  HUMULIN N 100 UNIT/ML injection Generic drug:  insulin NPH Human Give 5 units subcutaneous twice a day  hydroxychloroquine 200 MG tablet Commonly known as:  PLAQUENIL Take 200 mg by mouth daily.  Melatonin 3 MG Tabs Take 2 tablets by mouth at bedtime  metoprolol succinate 25 MG 24 hr tablet Commonly known as:  TOPROL-XL Take 25 mg by mouth every morning.  nystatin cream Commonly known as:  MYCOSTATIN Mix with zinc oxide and apply to sacrum daily & prn  olopatadine  0.1 % ophthalmic solution Commonly known as:  PATANOL Place 1 drop into both eyes 2 (two) times daily.  omega-3 acid ethyl esters 1 g capsule Commonly known as:  LOVAZA Take 2 g by mouth 2 (two) times daily.  oxyCODONE 5 MG immediate release tablet Commonly known as:  Oxy IR/ROXICODONE Take 1 tablet (5 mg total) by mouth every 12 (twelve) hours as needed for severe pain.  OXYGEN O 2 at 2 LPM via n/c to keep O2 sat above 90 % every shift  polyethylene glycol powder powder Commonly known as:  MIRALAX Take 17 g by mouth 2 (two) times daily.  polyvinyl alcohol 1.4 % ophthalmic solution Commonly known as:  LIQUIFILM TEARS Place 1 drop into both eyes as needed for dry eyes.  predniSONE 2.5 MG tablet Commonly known as:  DELTASONE Take 3 tablets (7.5 mg total) by mouth daily with breakfast.  pregabalin 25 MG capsule Commonly known as:  LYRICA Take one capsule by mouth at bedtime  REFRESH P.M. OP Apply to eye. ONE APPLICATION BOTH EYES. For dry eyes not relieved by theratears every 6 hours  senna-docusate 8.6-50 MG tablet Commonly known as:  Senokot-S Take 2 tablets by mouth daily as needed for mild constipation.  silver nitrate applicators 88-28 % applicator May apply silver nitrate applicator stick to epithelialized rounded borders of thigh wound daily as needed to promote wound healing once daily  tamsulosin 0.4 MG Caps capsule Commonly known as:  FLOMAX Take 0.4 mg by mouth at bedtime.  THERA-M ENHANCED PO 1 tablet by mouth daily  THERATEARS 0.25 % Soln Generic drug:  Carboxymethylcellulose Sodium Apply 2 drops to both eyes three times a day  thiamine 100 MG tablet Commonly known as:  VITAMIN B-1 Take 100 mg by mouth daily.  traZODone 50 MG tablet Commonly known as:  DESYREL Take 50 mg by mouth at bedtime.  vitamin C 500 MG tablet Commonly known as:  ASCORBIC ACID Take 500 mg by mouth daily.  Zinc Oxide 10 % Oint Mix with Nystatin and apply to sacrum daily & prn as ordered  once a day  zinc sulfate 220 (50 Zn) MG capsule Take 220 mg by mouth daily.      Review of Systems  Constitutional: Positive for appetite change and fatigue. Negative for fever.  Respiratory: Positive for chest tightness and shortness of breathAppears to be progressing-he also has a productive cough. Per speech therapist appears to be at risk with aspiration.  Cardiovascular: Negative for chest pain, palpitations  has some right lower extremity edema.  Gastrointestinal: Negative for abdominal distention, abdominal pain, constipation and diarrhea. .  Neurologic is not complaining of dizziness or headache.  Psych does have significant anxiety   Temperature 98.8 pulse 88 respirations 24 blood pressure 137/51 O2 saturation is 97% weight appears to be stable at 185.5 Physical Exam  Constitutional: He is oriented to person, place, and time. He appears fraill-he does have some use of accessory muscles but does not appear to be in acute distress appears anxious HENT:  Head: Normocephalic.  Mouth/Throat: Oropharynx is clear and moist.  Eyes: Pupils are equal, round, and reactive to light.  Cardiovascular: Normal rate, regular rhythm and normal heart sounds. Has some right lower extremity edema most   Pulmonary/Chest: Effort normal and breath sounds normal. Some use of accessory muscles.   has bilateral rales and rhonchi more on the right versus the left .  Abdominal: Soft. Bowel sounds are normal. He exhibits no distension. There is no tenderness. There is no rebound.  Musculoskeletal: He exhibits edema. d he is status post left BKA there is wrapping of the stump site  Neurological: He is alert and oriented to person, place, and time.  He does appear anxious   Labs reviewed: 09/01/2016.  Sodium 131 potassium 4.8 BUN 43 creatinine 2.06.  WBC 8.8 hemoglobin 9.5 platelets 811  Basic Metabolic Panel:  Recent Labs (within last 365 days)   Recent Labs  08/26/16 0509   08/27/16 0630  08/29/16 0634 08/30/16 0613 09/01/16 0735  NA 133*  < > 132*  132*  < > 130* 129* 131*  K 3.3*  < > 4.1  4.1  < > 4.7 4.8 4.8  CL 100*  < > 99*  98*  < > 96* 95* 94*  CO2 29  < > 29  30  < > 30 29 31   GLUCOSE 79  < > 140*  139*  < > 189* 109* 151*  BUN 16  < > 13  14  < > 27* 34* 43*  CREATININE 1.36*  < > 1.25*  1.28*  < > 1.84* 1.89* 2.06*  CALCIUM 8.0*  < > 7.8*  7.9*  < > 8.3* 8.3* 8.1*  MG 2.5*  --   --   --   --   --   --   PHOS  --   < > 3.5  --  4.4 4.3  --   < > = values in this interval not displayed.   Liver Function Tests:  Recent Labs (within last 365 days)   Recent Labs  08/09/16 0730 08/26/16 0509 08/26/16 0518 08/27/16 0630  AST 28 26  --  25  ALT 8* 14*  --  14*  ALKPHOS 320* 191*  --  211*  BILITOT 0.8 0.7  --  0.7  PROT 4.5* 5.0*  --  4.7*  ALBUMIN 1.8* 1.8* 1.9* 1.8*  1.8*       CBC:  Recent Labs (within last 365 days)   Recent Labs  08/09/16 0730  08/17/16 1700 08/26/16 0509 08/27/16 0630 08/30/16 0613 09/01/16 0735  WBC 5.0  --  6.8 7.9 7.1 9.8 8.8  NEUTROABS 2.2  --  5.0 4.8  --   --   --   HGB 7.2*  < > 9.2* 9.3* 9.3* 10.2* 9.5*  HCT 23.2*  < > 29.9* 29.8* 29.9* 32.4* 29.9*  MCV 105.0*  --  101.0* 99.7 99.3 98.5  96.1  PLT 211  --  246 222 197 267 232  < > = values in this interval not displayed.   Cardiac Enzymes:  Recent Labs (within last 365 days)   Recent Labs  08/26/16 0509  TROPONINI 0.04*     BNP: Recent Labs (within last 365 days)  Invalid input(s): POCBNP   Recent Labs       Lab Results  Component Value Date   HGBA1C 6.9 (H) 12/11/2012     Recent Labs       Lab Results  Component Value Date   TSH 2.421 08/26/2016     Recent Labs       Lab Results  Component Value Date   EKCMKLKJ17 915 08/13/2014     Recent Labs       Lab Results  Component Value Date   FOLATE >20.0 08/13/2014     Recent Labs       Lab Results  Component Value Date   IRON  48 08/13/2014   TIBC 264 08/13/2014   FERRITIN 67 08/13/2014      Imaging and Procedures obtained prior to SNF admission: No results found.  Assessment/Plan   #1-history of shortness of breath t cough and elevated respiratory rate-patient is a very fragile individual with a history combined diastolic and systolic CHF-also per discussion with speech therapist today he is at risk for aspiration-one would be concerned possibly about a pneumonia etiology as well.  Although he is not in acute distress he certainly appears to have some deterioration in his status-will send him to the ER for expedient evaluation.  [id number]

## 2016-09-02 NOTE — Progress Notes (Signed)
Patient order for nebs increased to Q 4 while awake. Patient back from [facility] having Rhonchi and wheezing received nebs in ER. Suspect this is more related to Kidney problems as he does have pleural effusion and more edema showing on X-RAY. Pneumonia cannot be ruled out. Also has very high anxiety due to severe arthritis which limits ability to call nurse on [facility]. Patient also stated he is  coughing up brown sputum in color.

## 2016-09-02 NOTE — Progress Notes (Signed)
ANTIBIOTIC CONSULT NOTE-Preliminary  Pharmacy Consult for Vancomycin and Cefepime Indication: pneumonia  Allergies  Allergen Reactions  . Sulfa Antibiotics Rash   Patient Measurements: Height: 5\' 9"  (175.3 cm) Weight: 185 lb (83.9 kg) IBW/kg (Calculated) : 70.7  Vital Signs: Temp: 99.5 F (37.5 C) (09/08 1431) Temp Source: Rectal (09/08 1431) BP: 148/83 (09/08 1725) Pulse Rate: 85 (09/08 1725)  Labs:  Recent Labs  09/01/16 0735 09/02/16 1559  WBC 8.8 10.1  HGB 9.5* 9.8*  PLT 232 248  CREATININE 2.06* 2.11*   Estimated Creatinine Clearance: 34.4 mL/min (by C-G formula based on SCr of 2.11 mg/dL).  No results for input(s): VANCOTROUGH, VANCOPEAK, VANCORANDOM, GENTTROUGH, GENTPEAK, GENTRANDOM, TOBRATROUGH, TOBRAPEAK, TOBRARND, AMIKACINPEAK, AMIKACINTROU, AMIKACIN in the last 72 hours.   Microbiology: Recent Results (from the past 720 hour(s))  MRSA PCR Screening     Status: None   Collection Time: 08/26/16 12:05 PM  Result Value Ref Range Status   MRSA by PCR NEGATIVE NEGATIVE Final    Comment:        The GeneXpert MRSA Assay (FDA approved for NASAL specimens only), is one component of a comprehensive MRSA colonization surveillance program. It is not intended to diagnose MRSA infection nor to guide or monitor treatment for MRSA infections.    Medical History: Past Medical History:  Diagnosis Date  . Chronic back pain   . Chronic neck pain   . Coronary artery disease 1993   angioplasty after AMI  . Diabetes mellitus   . Diabetic neuropathy ([place])   . Hyperkalemia   . Hypertension   . Pancytopenia ([place]) 08/13/2014  . 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  . Rheumatoid arthritis(714.0)   . Seizure ([place]) 08/13/2014   Pt denies   Anti-infectives    Start     Dose/Rate Route Frequency Ordered Stop   09/02/16 1800  vancomycin (VANCOCIN) 1,500 mg in sodium chloride 0.9 % 500 mL IVPB     1,500 mg 250 mL/hr  over 120 Minutes Intravenous  Once 09/02/16 1730     09/02/16 1745  ceFEPIme (MAXIPIME) 2 g in dextrose 5 % 50 mL IVPB     2 g 100 mL/hr over 30 Minutes Intravenous  Once 09/02/16 1732       Assessment: 65yo male sent to ED from [facility].  SCr elevated.  Asked to initiate Vancomycin and Cefepime for suspected HCAP.  Goal of Therapy:  Vancomycin trough level 15-20 mcg/ml  Plan:  Preliminary review of pertinent patient information completed.  Protocol will be initiated with a one-time dose(s) of Vancomycin 1500mg  and Cefepime 2000mg .  No additional doses will be needed tonight due to renal fxn.  Forestine Na clinical pharmacist will complete review during morning rounds to assess patient and finalize treatment regimen.  Hart Robinsons A, [place] 09/02/2016,5:32 PM

## 2016-09-02 NOTE — H&P (Signed)
History and Physical    [name] YBW:[id number] DOB: Jan 04, 1951 DOA: 09/02/2016  PCP: Lala Lund, MD   Patient coming from: SNF  Chief Complaint: Dyspnea, cough   HPI: [name] is a 65 y.o. male with medical history significant for insulin-dependent diabetes mellitus, rheumatoid arthritis, chronic systolic CHF, and chronic kidney disease stage III who presents from his SNF for evaluation of respiratory distress with productive cough. Patient had just been admitted for management of acute renal failure and volume overload, was dialyzed and diuresed, losing 10 pounds during the admission and with a net negative ~4.6 L. He was discharged to the SNF yesterday in much improved and stable condition, but since that time his respiratory status has re-worsened and his cough has increased and changed, now productive of thick discolored sputum. He reports worsening dyspnea at rest. Patient denies any chest pain or palpitations. He denies orthopnea or PND. He denies fevers, but notes chills.  ED Course: Upon arrival to the ED, patient is found to be afebrile, saturating adequately on 2 L/m of supplemental oxygen, tachypneic in the high 20s, and with vitals otherwise stable. EKG demonstrates a sinus rhythm with VPCs and nonspecific ST-T wave change in the inferior leads. Chest x-ray features bilateral pulmonary infiltrates suggestive of pulmonary edema, but with bibasilar pneumonia not excluded. Right pleural effusion is also noted on the chest x-ray. Chemistry panel features a serum creatinine of 2.11, up from an apparent baseline of 1.3. Serum sodium is 129, potassium 5.8, and glucose 227. CBC is notable for a stable normocytic anemia with hemoglobin of 9.8. Nephrology was consulted by the ED physician and advised that he could be treated medically without dialysis. Patient was given 900 mg IV Lasix, 125 mg IV Solu-Medrol, 500 mL normal saline bolus, and duo nebs in the emergency department. His  respiratory status improved somewhat with these measures, but he continued to be in some distress. He remained hemodynamically stable and will be observed on the telemetry unit for ongoing evaluation and management of respiratory distress suspected secondary to possible HCAP versus acute on chronic systolic CHF, in addition to his AKI with hyperkalemia.  Review of Systems:  All other systems reviewed and apart from HPI, are negative.  Past Medical History:  Diagnosis Date  . Chronic back pain   . Chronic neck pain   . Coronary artery disease 1993   angioplasty after AMI  . Diabetes mellitus   . Diabetic neuropathy ([place])   . Hyperkalemia   . Hypertension   . Pancytopenia ([place]) 08/13/2014  . 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  . Rheumatoid arthritis(714.0)   . Seizure ([place]) 08/13/2014   Pt denies    Past Surgical History:  Procedure Laterality Date  . CARDIAC SURGERY    . FRACTURE SURGERY    . JOINT REPLACEMENT       reports that he has never smoked. He has never used smokeless tobacco. He reports that he does not drink alcohol or use drugs.  Allergies  Allergen Reactions  . Sulfa Antibiotics Rash    Family History  Problem Relation Age of Onset  . Diabetes Father   . Dementia Father   . Heart attack Brother     multiple brothers with MIs  . Hypertension Mother   . Stroke Mother      Prior to Admission medications   Medication Sig Start Date End Date Taking? Authorizing Provider  acetaminophen (TYLENOL) 325 MG tablet  Take 650 mg by mouth every 6 (six) hours as needed for mild pain or moderate pain.    Yes Historical Provider, MD  albuterol (PROVENTIL) (2.5 MG/3ML) 0.083% nebulizer solution Take 2.5 mg by nebulization every 4 (four) hours as needed for wheezing or shortness of breath.   Yes Historical Provider, MD  Amino Acids-Protein Hydrolys (PRO-STAT 64 PO) Take 30 mLs by mouth 2 (two) times daily.   Yes Historical  Provider, MD  amiodarone (PACERONE) 200 MG tablet Take 200 mg by mouth daily.   Yes Historical Provider, MD  Artificial Tear Ointment (REFRESH P.M. OP) Apply 1 drop to eye daily as needed (FOR DY EYES). ONE APPLICATION BOTH EYES. For dry eyes not relieved by theratears every 6 hours    Yes Historical Provider, MD  aspirin 81 MG chewable tablet Chew 81 mg by mouth every morning.    Yes Historical Provider, MD  atorvastatin (LIPITOR) 80 MG tablet Take 80 mg by mouth daily.   Yes Historical Provider, MD  Carboxymethylcellulose Sodium (THERATEARS) 0.25 % SOLN Apply 2 drops to both eyes three times a day   Yes Historical Provider, MD  docusate sodium (COLACE) 100 MG capsule Take 100 mg by mouth 2 (two) times daily.   Yes Historical Provider, MD  hydroxychloroquine (PLAQUENIL) 200 MG tablet Take 200 mg by mouth daily.    Yes Historical Provider, MD  insulin NPH Human (HUMULIN N) 100 UNIT/ML injection Give 5 units subcutaneous twice a day   Yes Historical Provider, MD  Melatonin 3 MG TABS Take 2 tablets by mouth at bedtime   Yes Historical Provider, MD  metoprolol succinate (TOPROL-XL) 25 MG 24 hr tablet Take 25 mg by mouth every morning.   Yes Historical Provider, MD  Multiple Vitamins-Minerals (THERA-M ENHANCED PO) 1 tablet by mouth daily   Yes Historical Provider, MD  nystatin cream (MYCOSTATIN) Apply 1 application topically daily. Mix with zinc oxide and apply to sacrum daily & as needed   Yes Historical Provider, MD  olopatadine (PATANOL) 0.1 % ophthalmic solution Place 1 drop into both eyes 2 (two) times daily.   Yes Historical Provider, MD  omega-3 acid ethyl esters (LOVAZA) 1 g capsule Take 2 g by mouth 2 (two) times daily.   Yes Historical Provider, MD  oxyCODONE (OXY IR/ROXICODONE) 5 MG immediate release tablet Take 1 tablet (5 mg total) by mouth every 12 (twelve) hours as needed for severe pain. 08/31/16  Yes Gildardo Cranker, DO  OXYGEN Inhale 2 L into the lungs continuous. O 2 at 2 LPM via n/c to  keep O2 sat above 90 % every shift    Yes Historical Provider, MD  polyethylene glycol powder (MIRALAX) powder Take 17 g by mouth 2 (two) times daily. 08/14/14  Yes Rexene Alberts, MD  predniSONE (DELTASONE) 2.5 MG tablet Take 3 tablets (7.5 mg total) by mouth daily with breakfast. 08/28/16  Yes Orvan Falconer, MD  pregabalin (LYRICA) 25 MG capsule Take one capsule by mouth at bedtime Patient taking differently: Take 25 mg by mouth at bedtime. Take one capsule by mouth at bedtime 08/31/16  Yes Gildardo Cranker, DO  Tamsulosin HCl (FLOMAX) 0.4 MG CAPS Take 0.4 mg by mouth at bedtime.    Yes Historical Provider, MD  thiamine (VITAMIN B-1) 100 MG tablet Take 100 mg by mouth daily.   Yes Historical Provider, MD  traZODone (DESYREL) 50 MG tablet Take 50 mg by mouth at bedtime.   Yes Historical Provider, MD  vitamin C (ASCORBIC ACID) 500  MG tablet Take 500 mg by mouth daily.   Yes Historical Provider, MD  Zinc Oxide 10 % OINT Mix with Nystatin and apply to sacrum daily & prn as ordered once a day   Yes Historical Provider, MD  zinc sulfate 220 (50 Zn) MG capsule Take 220 mg by mouth daily.   Yes Historical Provider, MD    Physical Exam: Vitals:   09/02/16 1444 09/02/16 1530 09/02/16 1602 09/02/16 1725  BP:    148/83  Pulse:  87  85  Resp:  15  (!) 27  Temp:      TempSrc:      SpO2:  100% 100% 97%  Weight: 83.9 kg (185 lb)     Height: 5\' 9"  (1.753 m)         Constitutional: Tachypneic, in apparent discomfort, no pallor or cyanosis Eyes: PERTLA, lids and conjunctivae normal ENMT: Mucous membranes are moist. Posterior pharynx clear of any exudate or lesions.   Neck: normal, supple, no masses, no thyromegaly Respiratory: Coarse rhonchi at b/l bases and mid-lung zones. Tachypnea. Symmetric chest wall rise.  Cardiovascular: S1 & S2 heard, regular rate and rhythm. 2+ edema to RLE. No carotid bruit. Abdomen: No distension, no tenderness, no masses palpated. Bowel sounds normal.  Musculoskeletal: no clubbing /  cyanosis. Status-post left BKA. Normal muscle tone.  Skin: no significant rashes, lesions, ulcers on exposed surfaces. Warm, dry, well-perfused. Neurologic: CN 2-12 grossly intact. Sensation intact, DTR normal. Strength 5/5 in all 4 limbs.  Psychiatric: Normal judgment and insight. Alert and oriented x 3. Normal mood and affect.     Labs on Admission: I have personally reviewed following labs and imaging studies  CBC:  Recent Labs Lab 08/27/16 0630 08/30/16 0613 09/01/16 0735 09/02/16 1559  WBC 7.1 9.8 8.8 10.1  NEUTROABS  --   --   --  8.6*  HGB 9.3* 10.2* 9.5* 9.8*  HCT 29.9* 32.4* 29.9* 30.5*  MCV 99.3 98.5 96.1 96.2  PLT 197 267 232 937   Basic Metabolic Panel:  Recent Labs Lab 08/27/16 0630 08/28/16 0937 08/29/16 0634 08/30/16 0613 09/01/16 0735 09/02/16 1559  NA 132*  132* 131* 130* 129* 131* 129*  K 4.1  4.1 4.2 4.7 4.8 4.8 5.8*  CL 99*  98* 96* 96* 95* 94* 91*  CO2 29  30 29 30 29 31 29   GLUCOSE 140*  139* 167* 189* 109* 151* 227*  BUN 13  14 22* 27* 34* 43* 48*  CREATININE 1.25*  1.28* 1.72* 1.84* 1.89* 2.06* 2.11*  CALCIUM 7.8*  7.9* 8.2* 8.3* 8.3* 8.1* 8.4*  PHOS 3.5  --  4.4 4.3  --   --    GFR: Estimated Creatinine Clearance: 34.4 mL/min (by C-G formula based on SCr of 2.11 mg/dL). Liver Function Tests:  Recent Labs Lab 08/27/16 0630  AST 25  ALT 14*  ALKPHOS 211*  BILITOT 0.7  PROT 4.7*  ALBUMIN 1.8*  1.8*   No results for input(s): LIPASE, AMYLASE in the last 168 hours. No results for input(s): AMMONIA in the last 168 hours. Coagulation Profile: No results for input(s): INR, PROTIME in the last 168 hours. Cardiac Enzymes: No results for input(s): CKTOTAL, CKMB, CKMBINDEX, TROPONINI in the last 168 hours. BNP (last 3 results) No results for input(s): PROBNP in the last 8760 hours. HbA1C: No results for input(s): HGBA1C in the last 72 hours. CBG:  Recent Labs Lab 08/29/16 1120 08/29/16 1621 08/29/16 2047 08/30/16 0743  08/30/16 1141  GLUCAP 152*  172* 163* 100* 163*   Lipid Profile: No results for input(s): CHOL, HDL, LDLCALC, TRIG, CHOLHDL, LDLDIRECT in the last 72 hours. Thyroid Function Tests: No results for input(s): TSH, T4TOTAL, FREET4, T3FREE, THYROIDAB in the last 72 hours. Anemia Panel: No results for input(s): VITAMINB12, FOLATE, FERRITIN, TIBC, IRON, RETICCTPCT in the last 72 hours. Urine analysis:    Component Value Date/Time   COLORURINE AMBER (A) 08/26/2016 0940   APPEARANCEUR CLEAR 08/26/2016 0940   LABSPEC 1.020 08/26/2016 0940   PHURINE 6.0 08/26/2016 0940   GLUCOSEU NEGATIVE 08/26/2016 0940   HGBUR TRACE (A) 08/26/2016 0940   BILIRUBINUR NEGATIVE 08/26/2016 0940   KETONESUR NEGATIVE 08/26/2016 0940   PROTEINUR 30 (A) 08/26/2016 0940   UROBILINOGEN 0.2 08/12/2014 2358   NITRITE NEGATIVE 08/26/2016 0940   LEUKOCYTESUR TRACE (A) 08/26/2016 0940   Sepsis Labs: @LABRCNTIP (procalcitonin:4,lacticidven:4) ) Recent Results (from the past 240 hour(s))  MRSA PCR Screening     Status: None   Collection Time: 08/26/16 12:05 PM  Result Value Ref Range Status   MRSA by PCR NEGATIVE NEGATIVE Final    Comment:        The GeneXpert MRSA Assay (FDA approved for NASAL specimens only), is one component of a comprehensive MRSA colonization surveillance program. It is not intended to diagnose MRSA infection nor to guide or monitor treatment for MRSA infections.      Radiological Exams on Admission: Dg Chest Portable 1 View  Result Date: 09/02/2016 CLINICAL DATA:  Cough. EXAM: PORTABLE CHEST 1 VIEW COMPARISON:  08/26/2016. FINDINGS: Dialysis catheter in stable position. Prior CABG. Cardiomegaly with diffuse bilateral pulmonary infiltrates again noted. Right pleural effusion. Findings consistent congestive heart failure. Similar findings noted on prior exam. IMPRESSION: 1. Dialysis catheter in stable position. 2. Prior CABG. Cardiomegaly with bilateral pulmonary infiltrates consistent with  pulmonary edema. Right pleural effusion. Findings consistent with congestive heart failure. Bilateral pneumonia cannot be excluded. Electronically Signed   By: Marcello Moores  Register   On: 09/02/2016 17:01    EKG: Independently reviewed. Sinus rhythm, VPC, non-specific ST-T changes inferiorly   Assessment/Plan  1. HCAP  - There is some diagnostic uncertainty given absence of fever or leukocytosis - CXR with b/l infiltrates that may represent edema, but given the change in his cough, now with purulent sputum, and his admission wt 10 lb down from last week, suspect there is an infectious component to his presentation  - Empiric treatment initiated in ED with vancomycin and cefepime, will continue  - Sputum culture and gram stain requested, check urine for strep pneumo and legionella antigens  - Monitor on continuous pulse oximetry with titration of FiO2 to maintain saturations >92%   2. Acute on chronic diastolic CHF  - There is peripheral edema, JVD, and likely edema and effusion on CXR  - Wt is down 10 lbs from recent admission where he had HD with removal of 3.6L and diuresed another >1L - He had TTE in 5427 with diastolic dysfunction  - Given 100 mg IV Lasix in ED - Plan to SLIV, fluid-restrict diet, and follow daily wts and I/O's - Hold off on further diuresis for now given the AKI and stable respiratory status - Continue Toprol as tolerated; ACE/ARB precluded by AKI on CKD   3. AKI superimposed on CKD stage 3 - SCr 2.11 on admission, up from apparent baseline of 1.3  - Had recently required HD for volume overload and EDP discussed case with nephrology at time of admission, but no dialysis planned this admit  -  AKI likely secondary to recent diuresis, possibly exacerbated by suspected HCAP  - Given 500 cc NS in ED, but also given 100 mg IV Lasix; SLIV and hold-off on further diuresis for now - Repeat chem panel in am   4. Hyperkalemia  - Serum potassium 5.8 on admission, likely secondary  to the AKI    - Has been given 100 mg IV Lasix in ED, but K+ worsened to 6.1 on re-check  - Insulin 10 units IV, 1 g calcium gluconate, and oral kayexalate ordered  - Monitor on telemetry, repeat potassium level q4h until normalizes   5. Normocytic anemia  - Hgb 9.8 on admission and stable relative to recent CBC's  - No s/s of active blood-loss   6. Insulin-dependent DM  - A1c was 6.9% in 2013  - Managed at home with Humulin 5 units BID; this will be held while in hospital  - Check CBG with meals and qHS  - Start with Lantus 5 units qHS and low-intensity sliding-scale correctional   7. Rheumatoid Arthritis - Stable, continue current management with Plaquenil and prednisone    8. Sacral ulcer  - Present on arrival  - Wound care consultation requested    DVT prophylaxis: sq heparin  Code Status: Full  Family Communication: Wife updated at bedside  Disposition Plan: Observe on telemetry  Consults called: None Admission status: Observation    Vianne Bulls, MD [facility] Pager [phone]  If 7PM-7AM, please contact night-coverage [email or web address] Password [email or web address]  09/02/2016, 7:18 PM

## 2016-09-02 NOTE — ED Notes (Signed)
Patient pulled IV out.

## 2016-09-03 ENCOUNTER — Observation Stay (HOSPITAL_BASED_OUTPATIENT_CLINIC_OR_DEPARTMENT_OTHER): Payer: Medicare Other

## 2016-09-03 DIAGNOSIS — E8889 Other specified metabolic disorders: Secondary | ICD-10-CM | POA: Diagnosis not present

## 2016-09-03 DIAGNOSIS — G729 Myopathy, unspecified: Secondary | ICD-10-CM | POA: Diagnosis not present

## 2016-09-03 DIAGNOSIS — J189 Pneumonia, unspecified organism: Secondary | ICD-10-CM | POA: Diagnosis not present

## 2016-09-03 DIAGNOSIS — G4709 Other insomnia: Secondary | ICD-10-CM | POA: Diagnosis not present

## 2016-09-03 DIAGNOSIS — Z833 Family history of diabetes mellitus: Secondary | ICD-10-CM | POA: Diagnosis not present

## 2016-09-03 DIAGNOSIS — D539 Nutritional anemia, unspecified: Secondary | ICD-10-CM | POA: Diagnosis not present

## 2016-09-03 DIAGNOSIS — E1140 Type 2 diabetes mellitus with diabetic neuropathy, unspecified: Secondary | ICD-10-CM | POA: Diagnosis not present

## 2016-09-03 DIAGNOSIS — I5031 Acute diastolic (congestive) heart failure: Secondary | ICD-10-CM | POA: Diagnosis not present

## 2016-09-03 DIAGNOSIS — S88112D Complete traumatic amputation at level between knee and ankle, left lower leg, subsequent encounter: Secondary | ICD-10-CM | POA: Diagnosis not present

## 2016-09-03 DIAGNOSIS — Z794 Long term (current) use of insulin: Secondary | ICD-10-CM | POA: Diagnosis not present

## 2016-09-03 DIAGNOSIS — D649 Anemia, unspecified: Secondary | ICD-10-CM | POA: Diagnosis not present

## 2016-09-03 DIAGNOSIS — M24541 Contracture, right hand: Secondary | ICD-10-CM | POA: Diagnosis not present

## 2016-09-03 DIAGNOSIS — I5043 Acute on chronic combined systolic (congestive) and diastolic (congestive) heart failure: Secondary | ICD-10-CM | POA: Diagnosis not present

## 2016-09-03 DIAGNOSIS — E1122 Type 2 diabetes mellitus with diabetic chronic kidney disease: Secondary | ICD-10-CM | POA: Diagnosis not present

## 2016-09-03 DIAGNOSIS — G8929 Other chronic pain: Secondary | ICD-10-CM | POA: Diagnosis present

## 2016-09-03 DIAGNOSIS — T7840XD Allergy, unspecified, subsequent encounter: Secondary | ICD-10-CM | POA: Diagnosis not present

## 2016-09-03 DIAGNOSIS — M0609 Rheumatoid arthritis without rheumatoid factor, multiple sites: Secondary | ICD-10-CM | POA: Diagnosis not present

## 2016-09-03 DIAGNOSIS — Z4781 Encounter for orthopedic aftercare following surgical amputation: Secondary | ICD-10-CM | POA: Diagnosis not present

## 2016-09-03 DIAGNOSIS — Y95 Nosocomial condition: Secondary | ICD-10-CM | POA: Diagnosis present

## 2016-09-03 DIAGNOSIS — I509 Heart failure, unspecified: Secondary | ICD-10-CM | POA: Diagnosis not present

## 2016-09-03 DIAGNOSIS — R1310 Dysphagia, unspecified: Secondary | ICD-10-CM | POA: Diagnosis present

## 2016-09-03 DIAGNOSIS — R293 Abnormal posture: Secondary | ICD-10-CM | POA: Diagnosis not present

## 2016-09-03 DIAGNOSIS — G9340 Encephalopathy, unspecified: Secondary | ICD-10-CM | POA: Diagnosis not present

## 2016-09-03 DIAGNOSIS — Z89512 Acquired absence of left leg below knee: Secondary | ICD-10-CM | POA: Diagnosis not present

## 2016-09-03 DIAGNOSIS — R0600 Dyspnea, unspecified: Secondary | ICD-10-CM | POA: Diagnosis present

## 2016-09-03 DIAGNOSIS — I130 Hypertensive heart and chronic kidney disease with heart failure and stage 1 through stage 4 chronic kidney disease, or unspecified chronic kidney disease: Secondary | ICD-10-CM | POA: Diagnosis not present

## 2016-09-03 DIAGNOSIS — M6281 Muscle weakness (generalized): Secondary | ICD-10-CM | POA: Diagnosis not present

## 2016-09-03 DIAGNOSIS — M24542 Contracture, left hand: Secondary | ICD-10-CM | POA: Diagnosis not present

## 2016-09-03 DIAGNOSIS — N401 Enlarged prostate with lower urinary tract symptoms: Secondary | ICD-10-CM | POA: Diagnosis not present

## 2016-09-03 DIAGNOSIS — R1312 Dysphagia, oropharyngeal phase: Secondary | ICD-10-CM | POA: Diagnosis not present

## 2016-09-03 DIAGNOSIS — I2510 Atherosclerotic heart disease of native coronary artery without angina pectoris: Secondary | ICD-10-CM | POA: Diagnosis present

## 2016-09-03 DIAGNOSIS — N183 Chronic kidney disease, stage 3 (moderate): Secondary | ICD-10-CM | POA: Diagnosis not present

## 2016-09-03 DIAGNOSIS — J9691 Respiratory failure, unspecified with hypoxia: Secondary | ICD-10-CM | POA: Diagnosis not present

## 2016-09-03 DIAGNOSIS — N179 Acute kidney failure, unspecified: Secondary | ICD-10-CM | POA: Diagnosis not present

## 2016-09-03 DIAGNOSIS — R0602 Shortness of breath: Secondary | ICD-10-CM | POA: Diagnosis not present

## 2016-09-03 DIAGNOSIS — M069 Rheumatoid arthritis, unspecified: Secondary | ICD-10-CM | POA: Diagnosis not present

## 2016-09-03 DIAGNOSIS — E871 Hypo-osmolality and hyponatremia: Secondary | ICD-10-CM | POA: Diagnosis not present

## 2016-09-03 DIAGNOSIS — E1121 Type 2 diabetes mellitus with diabetic nephropathy: Secondary | ICD-10-CM | POA: Diagnosis not present

## 2016-09-03 DIAGNOSIS — R569 Unspecified convulsions: Secondary | ICD-10-CM | POA: Diagnosis not present

## 2016-09-03 DIAGNOSIS — I5022 Chronic systolic (congestive) heart failure: Secondary | ICD-10-CM | POA: Diagnosis not present

## 2016-09-03 DIAGNOSIS — E875 Hyperkalemia: Secondary | ICD-10-CM | POA: Diagnosis not present

## 2016-09-03 DIAGNOSIS — G92 Toxic encephalopathy: Secondary | ICD-10-CM | POA: Diagnosis not present

## 2016-09-03 DIAGNOSIS — M549 Dorsalgia, unspecified: Secondary | ICD-10-CM | POA: Diagnosis present

## 2016-09-03 DIAGNOSIS — L89152 Pressure ulcer of sacral region, stage 2: Secondary | ICD-10-CM | POA: Diagnosis not present

## 2016-09-03 DIAGNOSIS — R279 Unspecified lack of coordination: Secondary | ICD-10-CM | POA: Diagnosis not present

## 2016-09-03 DIAGNOSIS — E785 Hyperlipidemia, unspecified: Secondary | ICD-10-CM | POA: Diagnosis not present

## 2016-09-03 DIAGNOSIS — N189 Chronic kidney disease, unspecified: Secondary | ICD-10-CM | POA: Diagnosis not present

## 2016-09-03 DIAGNOSIS — Z823 Family history of stroke: Secondary | ICD-10-CM | POA: Diagnosis not present

## 2016-09-03 DIAGNOSIS — N186 End stage renal disease: Secondary | ICD-10-CM | POA: Diagnosis not present

## 2016-09-03 DIAGNOSIS — R262 Difficulty in walking, not elsewhere classified: Secondary | ICD-10-CM | POA: Diagnosis not present

## 2016-09-03 DIAGNOSIS — Z8249 Family history of ischemic heart disease and other diseases of the circulatory system: Secondary | ICD-10-CM | POA: Diagnosis not present

## 2016-09-03 DIAGNOSIS — E1151 Type 2 diabetes mellitus with diabetic peripheral angiopathy without gangrene: Secondary | ICD-10-CM | POA: Diagnosis not present

## 2016-09-03 DIAGNOSIS — D638 Anemia in other chronic diseases classified elsewhere: Secondary | ICD-10-CM | POA: Diagnosis not present

## 2016-09-03 DIAGNOSIS — I5033 Acute on chronic diastolic (congestive) heart failure: Secondary | ICD-10-CM | POA: Diagnosis not present

## 2016-09-03 LAB — BASIC METABOLIC PANEL
Anion gap: 9 (ref 5–15)
BUN: 53 mg/dL — ABNORMAL HIGH (ref 6–20)
CALCIUM: 8.6 mg/dL — AB (ref 8.9–10.3)
CHLORIDE: 93 mmol/L — AB (ref 101–111)
CO2: 28 mmol/L (ref 22–32)
CREATININE: 2.06 mg/dL — AB (ref 0.61–1.24)
GFR calc Af Amer: 37 mL/min — ABNORMAL LOW (ref >60)
GFR calc non Af Amer: 32 mL/min — ABNORMAL LOW (ref >60)
GLUCOSE: 297 mg/dL — AB (ref 65–99)
Potassium: 5.7 mmol/L — ABNORMAL HIGH (ref 3.5–5.1)
Sodium: 130 mmol/L — ABNORMAL LOW (ref 135–145)

## 2016-09-03 LAB — CBC
HEMATOCRIT: 27.8 % — AB (ref 39.0–52.0)
Hemoglobin: 8.9 g/dL — ABNORMAL LOW (ref 13.0–17.0)
MCH: 30.8 pg (ref 26.0–34.0)
MCHC: 32 g/dL (ref 30.0–36.0)
MCV: 96.2 fL (ref 78.0–100.0)
Platelets: 263 10*3/uL (ref 150–400)
RBC: 2.89 MIL/uL — ABNORMAL LOW (ref 4.22–5.81)
RDW: 17.2 % — AB (ref 11.5–15.5)
WBC: 9.2 10*3/uL (ref 4.0–10.5)

## 2016-09-03 LAB — GLUCOSE, CAPILLARY
GLUCOSE-CAPILLARY: 280 mg/dL — AB (ref 65–99)
Glucose-Capillary: 263 mg/dL — ABNORMAL HIGH (ref 65–99)
Glucose-Capillary: 249 mg/dL — ABNORMAL HIGH (ref 65–99)
Glucose-Capillary: 179 mg/dL — ABNORMAL HIGH (ref 65–99)
Glucose-Capillary: 180 mg/dL — ABNORMAL HIGH (ref 65–99)

## 2016-09-03 LAB — CREATININE, URINE, RANDOM: Creatinine, Urine: 22.37 mg/dL

## 2016-09-03 LAB — BLOOD GAS, ARTERIAL
Acid-Base Excess: 2.3 mmol/L — ABNORMAL HIGH (ref 0.0–2.0)
BICARBONATE: 26.1 mmol/L (ref 20.0–28.0)
DRAWN BY: 105551
FIO2: 32
O2 Saturation: 92.4 %
pCO2 arterial: 47.6 mmHg (ref 32.0–48.0)
pH, Arterial: 7.373 (ref 7.350–7.450)
pO2, Arterial: 69.4 mmHg — ABNORMAL LOW (ref 83.0–108.0)

## 2016-09-03 LAB — POTASSIUM
Potassium: 5.7 mmol/L — ABNORMAL HIGH (ref 3.5–5.1)
Potassium: 5.7 mmol/L — ABNORMAL HIGH (ref 3.5–5.1)
Potassium: 5.4 mmol/L — ABNORMAL HIGH (ref 3.5–5.1)

## 2016-09-03 LAB — STREP PNEUMONIAE URINARY ANTIGEN: STREP PNEUMO URINARY ANTIGEN: NEGATIVE

## 2016-09-03 LAB — SODIUM, URINE, RANDOM: Sodium, Ur: 66 mmol/L

## 2016-09-03 LAB — ECHOCARDIOGRAM COMPLETE
HEIGHTINCHES: 69 in
WEIGHTICAEL: 3128.77 [oz_av]

## 2016-09-03 MED ORDER — HEPARIN SODIUM (PORCINE) 1000 UNIT/ML DIALYSIS
1000.0000 [IU] | INTRAMUSCULAR | Status: DC | PRN
  Administered 2016-09-03: 4100 [IU] via INTRAVENOUS_CENTRAL
  Filled 2016-09-03 (×2): qty 1

## 2016-09-03 MED ORDER — SODIUM POLYSTYRENE SULFONATE 15 GM/60ML PO SUSP
15.0000 g | Freq: Once | ORAL | Status: AC
  Administered 2016-09-03: 15 g via ORAL
  Filled 2016-09-03: qty 60

## 2016-09-03 MED ORDER — ALTEPLASE 2 MG IJ SOLR
2.0000 mg | Freq: Once | INTRAMUSCULAR | Status: DC | PRN
  Filled 2016-09-03: qty 2

## 2016-09-03 MED ORDER — HEPARIN SODIUM (PORCINE) 1000 UNIT/ML IJ SOLN
INTRAMUSCULAR | Status: AC
  Administered 2016-09-03: 4100 [IU] via INTRAVENOUS_CENTRAL
  Filled 2016-09-03: qty 6

## 2016-09-03 MED ORDER — INSULIN GLARGINE 100 UNIT/ML ~~LOC~~ SOLN
8.0000 [IU] | Freq: Every day | SUBCUTANEOUS | Status: DC
  Administered 2016-09-03 – 2016-09-08 (×6): 8 [IU] via SUBCUTANEOUS
  Filled 2016-09-03 (×9): qty 0.08

## 2016-09-03 MED ORDER — SODIUM CHLORIDE 0.9 % IV SOLN: 100.0000 mL | INTRAVENOUS | Status: DC | PRN

## 2016-09-03 MED ORDER — DEXTROSE 5 % IV SOLN
INTRAVENOUS | Status: AC
  Filled 2016-09-03: qty 2

## 2016-09-03 MED ORDER — VANCOMYCIN HCL 10 G IV SOLR
1250.0000 mg | INTRAVENOUS | Status: DC
  Administered 2016-09-03: 1250 mg via INTRAVENOUS
  Filled 2016-09-03 (×2): qty 1250

## 2016-09-03 MED ORDER — CEFEPIME HCL 2 G IJ SOLR
2.0000 g | INTRAMUSCULAR | Status: DC
  Administered 2016-09-03: 2 g via INTRAVENOUS
  Filled 2016-09-03 (×2): qty 2

## 2016-09-03 MED ORDER — PREDNISONE 5 MG PO TABS
7.5000 mg | ORAL_TABLET | Freq: Every day | ORAL | Status: DC
  Administered 2016-09-04 – 2016-09-09 (×5): 7.5 mg via ORAL
  Filled 2016-09-03 (×6): qty 2

## 2016-09-03 NOTE — Progress Notes (Signed)
PROGRESS NOTE    [name]  XBJ:[id number] DOB: 09-02-1951 DOA: 09/02/2016 PCP: Lala Lund, MD     Brief Narrative:  65 y/o man admitted on 9/8 from [facility] with SOB. Found to have pulmonary edema as well as possible HCAP. Overnight has developed more confusing, agitation and delirium.   Assessment & Plan:   Principal Problem:   HCAP (healthcare-associated pneumonia) Active Problems:   Rheumatoid arthritis ([place])   Hypertension   Insulin dependent diabetes mellitus ([place])   Seizure ([place])   Anemia of chronic disease   Acute on chronic diastolic CHF (congestive heart failure) ([facility])   Acute renal failure superimposed on stage 3 chronic kidney disease ([facility])   Hyperkalemia   Sacral decubitus ulcer   Acute Encephalopathy -Likely due to medications and multiple metabolic abnormalities. -Have discussed with family cessation of ativan and trazodone for now. -Patient will undergo HD today for correction of metabolic abnormalities and fluid removal.  HCAP -Continue vanc/cefepime for now. -Cx data pending.  Acute on CKD Stage III -Has been dialyzed on and off acutely. -Discussed with Dr. Lowanda Foster. In light of volume overload and hyperkalemia, will proceed with HD session today.  Hyperkalemia -Not significantly improved with kayexalate. -Will undergo HD today.  Acute on Chronic Diastolic CHF -ECHO pending. -Continue lasix at nephrology's recommendations.  IDDM -Uncontrolled. -Increase lantus from 5 to 8 units today.  RA -Continue prednisone and plaquenil. -Received high dose solumedrol in ED (may be constributing to acute encephalopathy).    DVT prophylaxis: SQ heparin Code Status: full code Family Communication: daughter and son in law at bedside updated on plan of care Disposition Plan: back to SNF once ready for DC  Consultants:   Nephrology  Procedures:   None  Antimicrobials:   Vancomycin 9/8>>  Cefepime 9/8>>    Subjective: Drowsy,  difficult to arouse (per daughter was very agitated overnight and hallucinating)  Objective: Vitals:   09/03/16 1245 09/03/16 1250 09/03/16 1310 09/03/16 1330  BP: 132/80 139/68 (!) 138/50 140/75  Pulse: 93 94 92 88  Resp: 18     Temp: 97.5 F (36.4 C)     TempSrc: Oral     SpO2: 97%     Weight: 88.8 kg (195 lb 12.3 oz)     Height:        Intake/Output Summary (Last 24 hours) at 09/03/16 1345 Last data filed at 09/03/16 0800  Gross per 24 hour  Intake              440 ml  Output              450 ml  Net              -10 ml   Filed Weights   09/02/16 1444 09/03/16 0650 09/03/16 1245  Weight: 83.9 kg (185 lb) 88.7 kg (195 lb 8.8 oz) 88.8 kg (195 lb 12.3 oz)    Examination:  General exam: drowsy, non-purposeful jerky movements noted Respiratory system: Clear to auscultation. Respiratory effort normal. Cardiovascular system:RRR. No murmurs, rubs, gallops. Gastrointestinal system: Abdomen is nondistended, soft and nontender. No organomegaly or masses felt. Normal bowel sounds heard. Central nervous system: unable to assess given current mental state Extremities: 2++ edema bilaterally, +pedal pulses Skin: No rashes, lesions or ulcers Psychiatry: Unable to assess given current mental state.    Data Reviewed: I have personally reviewed following labs and imaging studies  CBC:  Recent Labs Lab 08/30/16 0613 09/01/16 0735 09/02/16 1559  WBC  9.8 8.8 10.1  NEUTROABS  --   --  8.6*  HGB 10.2* 9.5* 9.8*  HCT 32.4* 29.9* 30.5*  MCV 98.5 96.1 96.2  PLT 267 232 007   Basic Metabolic Panel:  Recent Labs Lab 08/29/16 0634 08/30/16 0613 09/01/16 0735 09/02/16 1559 09/02/16 1948 09/02/16 2319 09/03/16 0501 09/03/16 0745 09/03/16 1212  NA 130* 129* 131* 129*  --   --  130*  --   --   K 4.7 4.8 4.8 5.8* 6.1* 6.2* 5.7*  5.7* 5.7* 5.4*  CL 96* 95* 94* 91*  --   --  93*  --   --   CO2 30 29 31 29   --   --  28  --   --   GLUCOSE 189* 109* 151* 227*  --   --  297*  --    --   BUN 27* 34* 43* 48*  --   --  53*  --   --   CREATININE 1.84* 1.89* 2.06* 2.11*  --   --  2.06*  --   --   CALCIUM 8.3* 8.3* 8.1* 8.4*  --   --  8.6*  --   --   PHOS 4.4 4.3  --   --   --   --   --   --   --    GFR: Estimated Creatinine Clearance: 38.9 mL/min (by C-G formula based on SCr of 2.06 mg/dL). Liver Function Tests: No results for input(s): AST, ALT, ALKPHOS, BILITOT, PROT, ALBUMIN in the last 168 hours. No results for input(s): LIPASE, AMYLASE in the last 168 hours. No results for input(s): AMMONIA in the last 168 hours. Coagulation Profile: No results for input(s): INR, PROTIME in the last 168 hours. Cardiac Enzymes: No results for input(s): CKTOTAL, CKMB, CKMBINDEX, TROPONINI in the last 168 hours. BNP (last 3 results) No results for input(s): PROBNP in the last 8760 hours. HbA1C: No results for input(s): HGBA1C in the last 72 hours. CBG:  Recent Labs Lab 09/02/16 2052 09/02/16 2220 09/03/16 0112 09/03/16 0731 09/03/16 1204  GLUCAP 219* 221* 280* 263* 249*   Lipid Profile: No results for input(s): CHOL, HDL, LDLCALC, TRIG, CHOLHDL, LDLDIRECT in the last 72 hours. Thyroid Function Tests: No results for input(s): TSH, T4TOTAL, FREET4, T3FREE, THYROIDAB in the last 72 hours. Anemia Panel: No results for input(s): VITAMINB12, FOLATE, FERRITIN, TIBC, IRON, RETICCTPCT in the last 72 hours. Urine analysis:    Component Value Date/Time   COLORURINE AMBER (A) 08/26/2016 0940   APPEARANCEUR CLEAR 08/26/2016 0940   LABSPEC 1.020 08/26/2016 0940   PHURINE 6.0 08/26/2016 0940   GLUCOSEU NEGATIVE 08/26/2016 0940   HGBUR TRACE (A) 08/26/2016 0940   BILIRUBINUR NEGATIVE 08/26/2016 0940   KETONESUR NEGATIVE 08/26/2016 0940   PROTEINUR 30 (A) 08/26/2016 0940   UROBILINOGEN 0.2 08/12/2014 2358   NITRITE NEGATIVE 08/26/2016 0940   LEUKOCYTESUR TRACE (A) 08/26/2016 0940   Sepsis Labs: @LABRCNTIP (procalcitonin:4,lacticidven:4)  ) Recent Results (from the past 240  hour(s))  MRSA PCR Screening     Status: None   Collection Time: 08/26/16 12:05 PM  Result Value Ref Range Status   MRSA by PCR NEGATIVE NEGATIVE Final    Comment:        The GeneXpert MRSA Assay (FDA approved for NASAL specimens only), is one component of a comprehensive MRSA colonization surveillance program. It is not intended to diagnose MRSA infection nor to guide or monitor treatment for MRSA infections.  Radiology Studies: Dg Chest Portable 1 View  Result Date: 09/02/2016 CLINICAL DATA:  Cough. EXAM: PORTABLE CHEST 1 VIEW COMPARISON:  08/26/2016. FINDINGS: Dialysis catheter in stable position. Prior CABG. Cardiomegaly with diffuse bilateral pulmonary infiltrates again noted. Right pleural effusion. Findings consistent congestive heart failure. Similar findings noted on prior exam. IMPRESSION: 1. Dialysis catheter in stable position. 2. Prior CABG. Cardiomegaly with bilateral pulmonary infiltrates consistent with pulmonary edema. Right pleural effusion. Findings consistent with congestive heart failure. Bilateral pneumonia cannot be excluded. Electronically Signed   By: Marcello Moores  Register   On: 09/02/2016 17:01        Scheduled Meds: . amiodarone  200 mg Oral Daily  . aspirin  81 mg Oral q morning - 10a  . atorvastatin  80 mg Oral q1800  . ceFEPime (MAXIPIME) IV  2 g Intravenous Q24H  . chlorhexidine  15 mL Mouth Rinse BID  . dextrose  25 mL Intravenous Once  . docusate sodium  100 mg Oral BID  . feeding supplement (PRO-STAT SUGAR FREE 64)  30 mL Oral BID  . heparin  5,000 Units Subcutaneous Q8H  . hydroxychloroquine  200 mg Oral Daily  . insulin aspart  0-9 Units Subcutaneous TID WC  . insulin glargine  5 Units Subcutaneous QHS  . ipratropium-albuterol  3 mL Nebulization Q4H WA  . mouth rinse  15 mL Mouth Rinse q12n4p  . metoprolol succinate  25 mg Oral q morning - 10a  . olopatadine  1 drop Both Eyes BID  . omega-3 acid ethyl esters  2 g Oral BID  .  polyethylene glycol  17 g Oral BID  . polyvinyl alcohol  2 drop Both Eyes TID  . predniSONE  7.5 mg Oral Q breakfast  . pregabalin  25 mg Oral QHS  . sodium chloride flush  3 mL Intravenous Q12H  . tamsulosin  0.4 mg Oral QHS  . thiamine  100 mg Oral Daily  . vancomycin  1,250 mg Intravenous Q24H  . vitamin C  500 mg Oral Daily  . zinc sulfate  220 mg Oral Daily   Continuous Infusions:    LOS: 0 days    Time spent: 25 minutes. Greater than 50% of this time was spent in direct contact with the patient coordinating care.     Lelon Frohlich, MD [facility] Pager [phone]  If 7PM-7AM, please contact night-coverage [email or web address] Password [email or web address] 09/03/2016, 1:45 PM

## 2016-09-03 NOTE — Progress Notes (Signed)
Patient showing signs of confusion, abg in normal range for 3 litters oxygen. No PCO2 build up, Normal PH.

## 2016-09-03 NOTE — Procedures (Signed)
   HEMODIALYSIS TREATMENT NOTE:  3.5 hour heparin-free dialysis completed via left IJ tunneled catheter. Exit site unremarkable. Goal met: 2.6 liters removed without interruption in ultrafiltration. All blood was returned. Pt hemodynamically stable. Report called to Hinton Dyer, Therapist, sports.  Rockwell Alexandria, RN, CDN

## 2016-09-03 NOTE — Progress Notes (Signed)
Pharmacy Antibiotic Note  [name] is a 65 y.o. male admitted on 09/02/2016 with pneumonia.  Pharmacy has been consulted for [facility] AND CEFEPIME dosing.  Plan: Vancomycin 1250mg  IV q24hrs, goal trough 15-20 Cefepime 2gm IV q24hrs Monitor labs, renal fxn, progress and c/s  Height: 5\' 9"  (175.3 cm) Weight: 195 lb 8.8 oz (88.7 kg) IBW/kg (Calculated) : 70.7  Temp (24hrs), Avg:98.3 F (36.8 C), Min:97.7 F (36.5 C), Max:99.5 F (37.5 C)   Recent Labs Lab 08/29/16 0634 08/30/16 0613 09/01/16 0735 09/02/16 1559 09/03/16 0501  WBC  --  9.8 8.8 10.1  --   CREATININE 1.84* 1.89* 2.06* 2.11* 2.06*    Estimated Creatinine Clearance: 38.9 mL/min (by C-G formula based on SCr of 2.06 mg/dL).    Allergies  Allergen Reactions  . Sulfa Antibiotics Rash   Antimicrobials this admission: Vancomycin 9/8 >>  Cefepime 9/8 >>   Dose adjustments this admission:  Recent Results (from the past 240 hour(s))  MRSA PCR Screening     Status: None   Collection Time: 08/26/16 12:05 PM  Result Value Ref Range Status   MRSA by PCR NEGATIVE NEGATIVE Final    Comment:        The GeneXpert MRSA Assay (FDA approved for NASAL specimens only), is one component of a comprehensive MRSA colonization surveillance program. It is not intended to diagnose MRSA infection nor to guide or monitor treatment for MRSA infections.    Thank you for allowing pharmacy to be a part of this patient's care.  Hart Robinsons A 09/03/2016 9:42 AM

## 2016-09-03 NOTE — Progress Notes (Signed)
Family members concerned about pt's change in mental status. On prior encounters pt was alert and oriented x4, at this point pt is disoriented and is seeing people that is not there. Midlevel was notified and ABG was ordered. Results were normal and midlevel Lynch was notified. Family member were still concerned stating that he has never acted this way before. As per midlevel MD Shanon Brow was paged so as to come and lay eyes on pt. Spoke with Dr. Shanon Brow and stated that family members were concerned about pt's mental status. Dr. Shanon Brow stated that the pt's provider would see them in the am and to run another CBG. CBG was obtained-280. Family members were notified that MD in the am will come and see pt, however daughter was not happy and feels that a CT Scan should be performed on pt. AC Tim and charge nurse are aware of the situation.

## 2016-09-03 NOTE — Progress Notes (Signed)
Patient is still awake and confused , talked to wife will allow patient to go to sleep if he will.

## 2016-09-03 NOTE — Consult Note (Signed)
Reason for Consult: Chronic renal failure and difficulty breathing Referring Physician: Dr. Delories Heinz is an 65 y.o. male.  HPI: He is the patient was history of diabetes, rheumatoid arthritis, hypertension, chronic renal failure recently started on dialysis for acute on chronic renal failure when he was at [facility]. Patient was admitted about a week ago because of difficulty breathing and cough. Thought to be secondary to fluid overload and patient was dialyzed. Since his renal function remains stable with good urine output dialysis was discontinued. His last dialysis was on September 1. Presently he came from nursing home with similar problems. When he was evaluated and was found to hyperkalemia and possible CHF versus pneumonia. Presently patient is feeling better. However, does complain about increased confusion and restlessness throughout the night. His difficulty breathing is much better.  Past Medical History:  Diagnosis Date  . Chronic back pain   . Chronic neck pain   . Coronary artery disease 1993   angioplasty after AMI  . Diabetes mellitus   . Diabetic neuropathy ([place])   . Hyperkalemia   . Hypertension   . Pancytopenia ([place]) 08/13/2014  . 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  . Rheumatoid arthritis(714.0)   . Seizure ([place]) 08/13/2014   Pt denies    Past Surgical History:  Procedure Laterality Date  . CARDIAC SURGERY    . FRACTURE SURGERY    . JOINT REPLACEMENT      Family History  Problem Relation Age of Onset  . Diabetes Father   . Dementia Father   . Heart attack Brother     multiple brothers with MIs  . Hypertension Mother   . Stroke Mother     Social History:  reports that he has never smoked. He has never used smokeless tobacco. He reports that he does not drink alcohol or use drugs.  Allergies:  Allergies  Allergen Reactions  . Sulfa Antibiotics Rash    Medications: I have reviewed the  patient's current medications.  Results for orders placed or performed during the hospital encounter of 09/02/16 (from the past 48 hour(s))  CBC with Differential     Status: Abnormal   Collection Time: 09/02/16  3:59 PM  Result Value Ref Range   WBC 10.1 4.0 - 10.5 K/uL   RBC 3.17 (L) 4.22 - 5.81 MIL/uL   Hemoglobin 9.8 (L) 13.0 - 17.0 g/dL   HCT 30.5 (L) 39.0 - 52.0 %   MCV 96.2 78.0 - 100.0 fL   MCH 30.9 26.0 - 34.0 pg   MCHC 32.1 30.0 - 36.0 g/dL   RDW 17.2 (H) 11.5 - 15.5 %   Platelets 248 150 - 400 K/uL   Neutrophils Relative % 86 %   Neutro Abs 8.6 (H) 1.7 - 7.7 K/uL   Lymphocytes Relative 9 %   Lymphs Abs 0.9 0.7 - 4.0 K/uL   Monocytes Relative 4 %   Monocytes Absolute 0.4 0.1 - 1.0 K/uL   Eosinophils Relative 1 %   Eosinophils Absolute 0.1 0.0 - 0.7 K/uL   Basophils Relative 0 %   Basophils Absolute 0.0 0.0 - 0.1 K/uL  Basic metabolic panel     Status: Abnormal   Collection Time: 09/02/16  3:59 PM  Result Value Ref Range   Sodium 129 (L) 135 - 145 mmol/L   Potassium 5.8 (H) 3.5 - 5.1 mmol/L    Comment: DELTA CHECK NOTED   Chloride 91 (L) 101 - 111  mmol/L   CO2 29 22 - 32 mmol/L   Glucose, Bld 227 (H) 65 - 99 mg/dL   BUN 48 (H) 6 - 20 mg/dL   Creatinine, Ser 2.11 (H) 0.61 - 1.24 mg/dL   Calcium 8.4 (L) 8.9 - 10.3 mg/dL   GFR calc non Af Amer 31 (L) >60 mL/min   GFR calc Af Amer 36 (L) >60 mL/min    Comment: (NOTE) The eGFR has been calculated using the CKD EPI equation. This calculation has not been validated in all clinical situations. eGFR's persistently <60 mL/min signify possible Chronic Kidney Disease.    Anion gap 9 5 - 15  Brain natriuretic peptide     Status: Abnormal   Collection Time: 09/02/16  7:48 PM  Result Value Ref Range   B Natriuretic Peptide >4,500.0 (H) 0.0 - 100.0 pg/mL  Potassium     Status: Abnormal   Collection Time: 09/02/16  7:48 PM  Result Value Ref Range   Potassium 6.1 (H) 3.5 - 5.1 mmol/L  Glucose, capillary     Status:  Abnormal   Collection Time: 09/02/16  8:52 PM  Result Value Ref Range   Glucose-Capillary 219 (H) 65 - 99 mg/dL  Glucose, capillary     Status: Abnormal   Collection Time: 09/02/16 10:20 PM  Result Value Ref Range   Glucose-Capillary 221 (H) 65 - 99 mg/dL  Potassium     Status: Abnormal   Collection Time: 09/02/16 11:19 PM  Result Value Ref Range   Potassium 6.2 (H) 3.5 - 5.1 mmol/L  Blood gas, arterial     Status: Abnormal   Collection Time: 09/03/16 12:00 AM  Result Value Ref Range   FIO2 32.00    Delivery systems NASAL CANNULA    pH, Arterial 7.373 7.350 - 7.450   pCO2 arterial 47.6 32.0 - 48.0 mmHg   pO2, Arterial 69.4 (L) 83.0 - 108.0 mmHg   Bicarbonate 26.1 20.0 - 28.0 mmol/L   Acid-Base Excess 2.3 (H) 0.0 - 2.0 mmol/L   O2 Saturation 92.4 %   Collection site RADIAL    Drawn by [id number]    Sample type ARTERIAL    Allens test (pass/fail) PASS PASS  Glucose, capillary     Status: Abnormal   Collection Time: 09/03/16  1:12 AM  Result Value Ref Range   Glucose-Capillary 280 (H) 65 - 99 mg/dL  Basic metabolic panel     Status: Abnormal   Collection Time: 09/03/16  5:01 AM  Result Value Ref Range   Sodium 130 (L) 135 - 145 mmol/L   Potassium 5.7 (H) 3.5 - 5.1 mmol/L   Chloride 93 (L) 101 - 111 mmol/L   CO2 28 22 - 32 mmol/L   Glucose, Bld 297 (H) 65 - 99 mg/dL   BUN 53 (H) 6 - 20 mg/dL   Creatinine, Ser 2.06 (H) 0.61 - 1.24 mg/dL   Calcium 8.6 (L) 8.9 - 10.3 mg/dL   GFR calc non Af Amer 32 (L) >60 mL/min   GFR calc Af Amer 37 (L) >60 mL/min    Comment: (NOTE) The eGFR has been calculated using the CKD EPI equation. This calculation has not been validated in all clinical situations. eGFR's persistently <60 mL/min signify possible Chronic Kidney Disease.    Anion gap 9 5 - 15  Potassium     Status: Abnormal   Collection Time: 09/03/16  5:01 AM  Result Value Ref Range   Potassium 5.7 (H) 3.5 - 5.1 mmol/L  Glucose, capillary  Status: Abnormal   Collection Time:  09/03/16  7:31 AM  Result Value Ref Range   Glucose-Capillary 263 (H) 65 - 99 mg/dL   Comment 1 Notify RN    Comment 2 Document in Chart   Potassium     Status: Abnormal   Collection Time: 09/03/16  7:45 AM  Result Value Ref Range   Potassium 5.7 (H) 3.5 - 5.1 mmol/L  Sodium, urine, random     Status: None   Collection Time: 09/03/16  8:00 AM  Result Value Ref Range   Sodium, Ur 66 mmol/L  Creatinine, urine, random     Status: None   Collection Time: 09/03/16  8:00 AM  Result Value Ref Range   Creatinine, Urine 22.37 mg/dL    Dg Chest Portable 1 View  Result Date: 09/02/2016 CLINICAL DATA:  Cough. EXAM: PORTABLE CHEST 1 VIEW COMPARISON:  08/26/2016. FINDINGS: Dialysis catheter in stable position. Prior CABG. Cardiomegaly with diffuse bilateral pulmonary infiltrates again noted. Right pleural effusion. Findings consistent congestive heart failure. Similar findings noted on prior exam. IMPRESSION: 1. Dialysis catheter in stable position. 2. Prior CABG. Cardiomegaly with bilateral pulmonary infiltrates consistent with pulmonary edema. Right pleural effusion. Findings consistent with congestive heart failure. Bilateral pneumonia cannot be excluded. Electronically Signed   By: Marcello Moores  Register   On: 09/02/2016 17:01    Review of Systems  Constitutional: Negative for chills and fever.  Respiratory: Positive for cough, shortness of breath and wheezing.   Cardiovascular: Positive for leg swelling.  Gastrointestinal: Negative for abdominal pain, nausea and vomiting.  Musculoskeletal: Positive for back pain.   Blood pressure (!) 169/90, pulse 100, temperature 97.7 F (36.5 C), temperature source Oral, resp. rate 16, height '5\' 9"'  (1.753 m), weight 88.7 kg (195 lb 8.8 oz), SpO2 96 %. Physical Exam  Constitutional: No distress.  Neck: JVD present.  Cardiovascular: Normal rate and regular rhythm.   Murmur heard. Respiratory: No respiratory distress. He has wheezes.  GI: He exhibits no  distension. There is no tenderness.  Musculoskeletal: He exhibits edema.  Neurological: He is alert.    Assessment/Plan: Problem #1 difficulty breathing possibly a combination of pleural effusion/pneumonia/CHF. Presently patient is on Lasix nonoliguric with about 400 mL of urine output. Patient however still with significant leg edema. Problem #2 altered mental status: Etiology not clear. Possibly secondary to medications. Problem #3 coronary artery disease Problem #4 rheumatoid arthritis Problem #5 hyperkalemia Problem #6 chronic renal failure presently spelled function seems to stable. Creatinine is 2.06 which is his baseline. Patient also denies any nausea or vomiting. Problem 7 chronic back pain Plan: We'll dialyze patient today for his hyperkalemia and also to see whether fluid overload is playing some role in his difficulty breathing. We'll check his renal panel and CBC in the morning.  Evolett Somarriba S 09/03/2016, 9:27 AM

## 2016-09-04 LAB — RENAL FUNCTION PANEL
ALBUMIN: 2.1 g/dL — AB (ref 3.5–5.0)
Anion gap: 9 (ref 5–15)
BUN: 27 mg/dL — AB (ref 6–20)
CHLORIDE: 96 mmol/L — AB (ref 101–111)
CO2: 31 mmol/L (ref 22–32)
CREATININE: 1.31 mg/dL — AB (ref 0.61–1.24)
Calcium: 8.5 mg/dL — ABNORMAL LOW (ref 8.9–10.3)
GFR calc Af Amer: >60 mL/min (ref >60)
GFR, EST NON AFRICAN AMERICAN: 55 mL/min — AB (ref >60)
GLUCOSE: 168 mg/dL — AB (ref 65–99)
PHOSPHORUS: 3.6 mg/dL (ref 2.5–4.6)
POTASSIUM: 3.8 mmol/L (ref 3.5–5.1)
Sodium: 136 mmol/L (ref 135–145)

## 2016-09-04 LAB — GLUCOSE, CAPILLARY
GLUCOSE-CAPILLARY: 162 mg/dL — AB (ref 65–99)
GLUCOSE-CAPILLARY: 158 mg/dL — AB (ref 65–99)
Glucose-Capillary: 165 mg/dL — ABNORMAL HIGH (ref 65–99)
Glucose-Capillary: 179 mg/dL — ABNORMAL HIGH (ref 65–99)
Glucose-Capillary: 220 mg/dL — ABNORMAL HIGH (ref 65–99)

## 2016-09-04 LAB — CBC
HEMATOCRIT: 29.3 % — AB (ref 39.0–52.0)
Hemoglobin: 9.2 g/dL — ABNORMAL LOW (ref 13.0–17.0)
MCH: 31.1 pg (ref 26.0–34.0)
MCHC: 31.4 g/dL (ref 30.0–36.0)
MCV: 99 fL (ref 78.0–100.0)
PLATELETS: 224 10*3/uL (ref 150–400)
RBC: 2.96 MIL/uL — ABNORMAL LOW (ref 4.22–5.81)
RDW: 17.2 % — AB (ref 11.5–15.5)
WBC: 8.5 10*3/uL (ref 4.0–10.5)

## 2016-09-04 LAB — BASIC METABOLIC PANEL
Anion gap: 8 (ref 5–15)
BUN: 28 mg/dL — AB (ref 6–20)
CO2: 31 mmol/L (ref 22–32)
CREATININE: 1.26 mg/dL — AB (ref 0.61–1.24)
Calcium: 8.4 mg/dL — ABNORMAL LOW (ref 8.9–10.3)
Chloride: 96 mmol/L — ABNORMAL LOW (ref 101–111)
GFR calc Af Amer: >60 mL/min (ref >60)
GFR, EST NON AFRICAN AMERICAN: 57 mL/min — AB (ref >60)
GLUCOSE: 168 mg/dL — AB (ref 65–99)
Potassium: 3.8 mmol/L (ref 3.5–5.1)
SODIUM: 135 mmol/L (ref 135–145)

## 2016-09-04 LAB — HIV ANTIBODY (ROUTINE TESTING W REFLEX): HIV Screen 4th Generation wRfx: NONREACTIVE

## 2016-09-04 MED ORDER — EPOETIN ALFA 10000 UNIT/ML IJ SOLN: 10000.0000 [IU] | INTRAMUSCULAR | Status: DC

## 2016-09-04 MED ORDER — DEXTROSE 5 % IV SOLN
2.0000 g | INTRAVENOUS | Status: DC
  Filled 2016-09-04: qty 2

## 2016-09-04 MED ORDER — TORSEMIDE 20 MG PO TABS
60.0000 mg | ORAL_TABLET | Freq: Every day | ORAL | Status: DC
  Administered 2016-09-04 – 2016-09-07 (×3): 60 mg via ORAL
  Filled 2016-09-04 (×4): qty 3

## 2016-09-04 MED ORDER — VANCOMYCIN HCL IN DEXTROSE 1-5 GM/200ML-% IV SOLN: 1000.0000 mg | INTRAVENOUS | Status: DC

## 2016-09-04 NOTE — Progress Notes (Signed)
Called to bedside to examine patient. He had an acute mental status change with eyes rolled back and difficulty speaking. Episode lasted less than 5 minutes. There was then a period of around 20 minutes where he was disoriented and did not recognize his family members. He is now at baseline. Recognizes me and brings details to conversation about things we had discussed this morning. Episode could have been a partial seizure. Will request EEG and neurology consultation.  Domingo Mend, MD [facility] Pager: [phone]

## 2016-09-04 NOTE — Progress Notes (Signed)
Around 1805, pt had an episode of expressive aphasia.  When trying to talk, pt was able to move his lips, but was unable to produce a sound.  His lips quivered and eyes opened and closed.  Pt was able to respond to verbal commands by doing things like smiling, and blinking.  Non-verbal responses were appropriate.  Pt's grip was weak bilaterally, but pt has a hx of RA, which also prevented him from performing pronator drift exam.  Smile was even, and tongue was midline on extension.    After drinking some tea, the pt was able to speak again.  He answered all questions appropriately and was oriented x 4.  Pt has a foley catheter, so unable to evaluate for loss of urinary continence. Pt has also been lethargic and will fall asleep in the middle of conversation, however this appears to be improving with time.  MD notified, and will examine the pt.

## 2016-09-04 NOTE — Progress Notes (Signed)
PROGRESS NOTE    [name]  BSJ:[id number] DOB: [birth year] DOA: 09/02/2016 PCP: Lala Lund, MD     Brief Narrative:  65 y/o man admitted on 9/8 from [facility] with SOB. Found to have pulmonary edema as well as possible HCAP. Overnight has developed more confusing, agitation and delirium. Confusion has resolved.   Assessment & Plan:   Principal Problem:   HCAP (healthcare-associated pneumonia) Active Problems:   Rheumatoid arthritis ([place])   Hypertension   Insulin dependent diabetes mellitus ([place])   Seizure ([place])   Anemia of chronic disease   Acute on chronic diastolic CHF (congestive heart failure) (HCC)   Acute renal failure superimposed on stage 3 chronic kidney disease (HCC)   Hyperkalemia   Sacral decubitus ulcer   Acute Encephalopathy -Likely due to medications and multiple metabolic abnormalities. -Has resolved and is back to baseline per wife at bedside.  HCAP -Continue vanc/cefepime for now. -Cx data pending. -Has copious sputum production.  Dysphagia -Will request ST consult for swallow evaluation.  Acute on CKD Stage III -Has been dialyzed on and off acutely. -Had HD 9/9. -For HD again in am.  Hyperkalemia -Resolved with HD.  Acute on Chronic Diastolic CHF -ECHO: EF 76-54% with grade 2 diastolic dysfunction. -Continue lasix at nephrology's recommendations.  IDDM -Improved with increased lantus dose.  RA -Continue prednisone and plaquenil. -Received high dose solumedrol in ED (may be constributing to acute encephalopathy).    DVT prophylaxis: SQ heparin Code Status: full code Family Communication: wife and brother at bedside updated on plan of care Disposition Plan: back to SNF once ready for DC  Consultants:   Nephrology  Procedures:   None  Antimicrobials:   Vancomycin 9/8>>  Cefepime 9/8>>    Subjective: Awake, conversant, copious sputum production.  Objective: Vitals:   09/04/16 0557 09/04/16 0826 09/04/16 1313  09/04/16 1358  BP: (!) 117/55   (!) 131/56  Pulse: 80   (!) 53  Resp: 17   18  Temp: 98.3 F (36.8 C)   98.5 F (36.9 C)  TempSrc: Oral   Oral  SpO2: 100% 99% 96% 95%  Weight: 87.4 kg (192 lb 10.9 oz)     Height:        Intake/Output Summary (Last 24 hours) at 09/04/16 1543 Last data filed at 09/04/16 1402  Gross per 24 hour  Intake             1020 ml  Output             3100 ml  Net            -2080 ml   Filed Weights   09/03/16 0650 09/03/16 1245 09/04/16 0557  Weight: 88.7 kg (195 lb 8.8 oz) 88.8 kg (195 lb 12.3 oz) 87.4 kg (192 lb 10.9 oz)    Examination:  General exam: AA Ox3 Respiratory system: Clear to auscultation. Respiratory effort normal. Cardiovascular system:RRR. No murmurs, rubs, gallops. Gastrointestinal system: Abdomen is nondistended, soft and nontender. No organomegaly or masses felt. Normal bowel sounds heard. Central nervous system: unable to assess given current mental state Extremities: 2++ edema on right, s/p left BKA, +pedal pulses     Data Reviewed: I have personally reviewed following labs and imaging studies  CBC:  Recent Labs Lab 08/30/16 0613 09/01/16 0735 09/02/16 1559 09/03/16 1500 09/04/16 0627  WBC 9.8 8.8 10.1 9.2 8.5  NEUTROABS  --   --  8.6*  --   --   HGB 10.2* 9.5*  9.8* 8.9* 9.2*  HCT 32.4* 29.9* 30.5* 27.8* 29.3*  MCV 98.5 96.1 96.2 96.2 99.0  PLT 267 232 248 263 176   Basic Metabolic Panel:  Recent Labs Lab 08/29/16 0634 08/30/16 1607 09/01/16 0735 09/02/16 1559  09/02/16 2319 09/03/16 0501 09/03/16 0745 09/03/16 1212 09/04/16 0627  NA 130* 129* 131* 129*  --   --  130*  --   --  136  135  K 4.7 4.8 4.8 5.8*  < > 6.2* 5.7*  5.7* 5.7* 5.4* 3.8  3.8  CL 96* 95* 94* 91*  --   --  93*  --   --  96*  96*  CO2 30 29 31 29   --   --  28  --   --  31  31  GLUCOSE 189* 109* 151* 227*  --   --  297*  --   --  168*  168*  BUN 27* 34* 43* 48*  --   --  53*  --   --  27*  28*  CREATININE 1.84* 1.89* 2.06*  2.11*  --   --  2.06*  --   --  1.31*  1.26*  CALCIUM 8.3* 8.3* 8.1* 8.4*  --   --  8.6*  --   --  8.5*  8.4*  PHOS 4.4 4.3  --   --   --   --   --   --   --  3.6  < > = values in this interval not displayed. GFR: Estimated Creatinine Clearance: 62.3 mL/min (by C-G formula based on SCr of 1.26 mg/dL). Liver Function Tests:  Recent Labs Lab 09/04/16 0627  ALBUMIN 2.1*   No results for input(s): LIPASE, AMYLASE in the last 168 hours. No results for input(s): AMMONIA in the last 168 hours. Coagulation Profile: No results for input(s): INR, PROTIME in the last 168 hours. Cardiac Enzymes: No results for input(s): CKTOTAL, CKMB, CKMBINDEX, TROPONINI in the last 168 hours. BNP (last 3 results) No results for input(s): PROBNP in the last 8760 hours. HbA1C: No results for input(s): HGBA1C in the last 72 hours. CBG:  Recent Labs Lab 09/03/16 1204 09/03/16 1642 09/03/16 2011 09/04/16 0723 09/04/16 1138  GLUCAP 249* 179* 180* 162* 158*   Lipid Profile: No results for input(s): CHOL, HDL, LDLCALC, TRIG, CHOLHDL, LDLDIRECT in the last 72 hours. Thyroid Function Tests: No results for input(s): TSH, T4TOTAL, FREET4, T3FREE, THYROIDAB in the last 72 hours. Anemia Panel: No results for input(s): VITAMINB12, FOLATE, FERRITIN, TIBC, IRON, RETICCTPCT in the last 72 hours. Urine analysis:    Component Value Date/Time   COLORURINE AMBER (A) 08/26/2016 0940   APPEARANCEUR CLEAR 08/26/2016 0940   LABSPEC 1.020 08/26/2016 0940   PHURINE 6.0 08/26/2016 0940   GLUCOSEU NEGATIVE 08/26/2016 0940   HGBUR TRACE (A) 08/26/2016 0940   BILIRUBINUR NEGATIVE 08/26/2016 0940   KETONESUR NEGATIVE 08/26/2016 0940   PROTEINUR 30 (A) 08/26/2016 0940   UROBILINOGEN 0.2 08/12/2014 2358   NITRITE NEGATIVE 08/26/2016 0940   LEUKOCYTESUR TRACE (A) 08/26/2016 0940   Sepsis Labs: @LABRCNTIP (procalcitonin:4,lacticidven:4)  ) Recent Results (from the past 240 hour(s))  MRSA PCR Screening     Status: None    Collection Time: 08/26/16 12:05 PM  Result Value Ref Range Status   MRSA by PCR NEGATIVE NEGATIVE Final    Comment:        The GeneXpert MRSA Assay (FDA approved for NASAL specimens only), is one component of a comprehensive MRSA colonization surveillance program.  It is not intended to diagnose MRSA infection nor to guide or monitor treatment for MRSA infections.          Radiology Studies: Dg Chest Portable 1 View  Result Date: 09/02/2016 CLINICAL DATA:  Cough. EXAM: PORTABLE CHEST 1 VIEW COMPARISON:  08/26/2016. FINDINGS: Dialysis catheter in stable position. Prior CABG. Cardiomegaly with diffuse bilateral pulmonary infiltrates again noted. Right pleural effusion. Findings consistent congestive heart failure. Similar findings noted on prior exam. IMPRESSION: 1. Dialysis catheter in stable position. 2. Prior CABG. Cardiomegaly with bilateral pulmonary infiltrates consistent with pulmonary edema. Right pleural effusion. Findings consistent with congestive heart failure. Bilateral pneumonia cannot be excluded. Electronically Signed   By: Marcello Moores  Register   On: 09/02/2016 17:01        Scheduled Meds: . amiodarone  200 mg Oral Daily  . aspirin  81 mg Oral q morning - 10a  . atorvastatin  80 mg Oral q1800  . [START ON 09/05/2016] ceFEPime (MAXIPIME) IV  2 g Intravenous Q M,W,F-HD  . chlorhexidine  15 mL Mouth Rinse BID  . dextrose  25 mL Intravenous Once  . docusate sodium  100 mg Oral BID  . [START ON 09/05/2016] epoetin (EPOGEN/PROCRIT) injection  10,000 Units Intravenous Q M,W,F-HD  . feeding supplement (PRO-STAT SUGAR FREE 64)  30 mL Oral BID  . heparin  5,000 Units Subcutaneous Q8H  . hydroxychloroquine  200 mg Oral Daily  . insulin aspart  0-9 Units Subcutaneous TID WC  . insulin glargine  8 Units Subcutaneous QHS  . ipratropium-albuterol  3 mL Nebulization Q4H WA  . mouth rinse  15 mL Mouth Rinse q12n4p  . metoprolol succinate  25 mg Oral q morning - 10a  .  olopatadine  1 drop Both Eyes BID  . omega-3 acid ethyl esters  2 g Oral BID  . polyethylene glycol  17 g Oral BID  . polyvinyl alcohol  2 drop Both Eyes TID  . predniSONE  7.5 mg Oral Q breakfast  . pregabalin  25 mg Oral QHS  . sodium chloride flush  3 mL Intravenous Q12H  . tamsulosin  0.4 mg Oral QHS  . thiamine  100 mg Oral Daily  . torsemide  60 mg Oral Daily  . [START ON 09/05/2016] vancomycin  1,000 mg Intravenous Q M,W,F-HD  . vitamin C  500 mg Oral Daily  . zinc sulfate  220 mg Oral Daily   Continuous Infusions:    LOS: 1 day    Time spent: 25 minutes. Greater than 50% of this time was spent in direct contact with the patient coordinating care.     Lelon Frohlich, MD [facility] Pager [phone]  If 7PM-7AM, please contact night-coverage [email or web address] Password [facility] 09/04/2016, 3:43 PM

## 2016-09-04 NOTE — Progress Notes (Signed)
Pharmacy Antibiotic Note  [name] is a 66 y.o. male admitted on 09/02/2016 with pneumonia.  Pharmacy has been consulted for [name] AND CEFEPIME dosing.  Nephrology notes reviewed, pt has been started on dialysis, scheduled for MWF.  Plan: Vancomycin 1000mg  IV after each HD session Pre-Hemodialysis Vancomycin level goal range =15-25 mcg/ml Cefepime 2gm IV after each HD session Monitor labs, renal fxn, progress and c/s F/U duration of therapy, deescalate ABX when appropriate  Height: 5\' 9"  (175.3 cm) Weight: 192 lb 10.9 oz (87.4 kg) IBW/kg (Calculated) : 70.7  Temp (24hrs), Avg:98.2 F (36.8 C), Min:97.5 F (36.4 C), Max:98.8 F (37.1 C)   Recent Labs Lab 08/30/16 0613 09/01/16 0735 09/02/16 1559 09/03/16 0501 09/03/16 1500 09/04/16 0627  WBC 9.8 8.8 10.1  --  9.2 8.5  CREATININE 1.89* 2.06* 2.11* 2.06*  --  1.31*  1.26*    Estimated Creatinine Clearance: 62.3 mL/min (by C-G formula based on SCr of 1.26 mg/dL).    Allergies  Allergen Reactions  . Sulfa Antibiotics Rash   Antimicrobials this admission: Vancomycin 9/8 >>  Cefepime 9/8 >>   Dose adjustments this admission: 9/10:  Vanc and Cefepime changed to dialysis dosing as pt was started on scheduled dialysis sessions:  MWF  Recent Results (from the past 240 hour(s))  MRSA PCR Screening     Status: None   Collection Time: 08/26/16 12:05 PM  Result Value Ref Range Status   MRSA by PCR NEGATIVE NEGATIVE Final    Comment:        The GeneXpert MRSA Assay (FDA approved for NASAL specimens only), is one component of a comprehensive MRSA colonization surveillance program. It is not intended to diagnose MRSA infection nor to guide or monitor treatment for MRSA infections.    Thank you for allowing pharmacy to be a part of this patient's care.  Hart Robinsons A 09/04/2016 9:03 AM

## 2016-09-04 NOTE — Progress Notes (Signed)
Subjective: Interval History: has no complaint of nausea or vomiting. Patient also denies any difficulty breathing. According to his wife he slept after midnight and this morning seems to be feeling much better..  Objective: Vital signs in last 24 hours: Temp:  [97.5 F (36.4 C)-98.8 F (37.1 C)] 98.3 F (36.8 C) (09/10 0557) Pulse Rate:  [78-94] 80 (09/10 0557) Resp:  [17-18] 17 (09/10 0557) BP: (117-153)/(50-80) 117/55 (09/10 0557) SpO2:  [94 %-100 %] 99 % (09/10 0826) Weight:  [87.4 kg (192 lb 10.9 oz)-88.8 kg (195 lb 12.3 oz)] 87.4 kg (192 lb 10.9 oz) (09/10 0557) Weight change: 4.885 kg (10 lb 12.3 oz)  Intake/Output from previous day: 09/09 0701 - 09/10 0700 In: 1020 [P.O.:720; IV Piggyback:300] Out: 2600  Intake/Output this shift: No intake/output data recorded.  Generally patient is alert and calmer  today. 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 No results for input(s): PTH in the last 72 hours. Iron Studies: No results for input(s): IRON, TIBC, TRANSFERRIN, FERRITIN in the last 72 hours.  Studies/Results: Dg Chest Portable 1 View  Result Date: 09/02/2016 CLINICAL DATA:  Cough. EXAM: PORTABLE CHEST 1 VIEW COMPARISON:  08/26/2016. FINDINGS: Dialysis catheter in stable position. Prior CABG. Cardiomegaly with diffuse bilateral pulmonary infiltrates  again noted. Right pleural effusion. Findings consistent congestive heart failure. Similar findings noted on prior exam. IMPRESSION: 1. Dialysis catheter in stable position. 2. Prior CABG. Cardiomegaly with bilateral pulmonary infiltrates consistent with pulmonary edema. Right pleural effusion. Findings consistent with congestive heart failure. Bilateral pneumonia cannot be excluded. Electronically Signed   By: Marcello Moores  Register   On: 09/02/2016 17:01    I have reviewed the patient's current medications.  Assessment/Plan: Problem #1 difficulty in breathing possibly multifactorial. Presently seems to be feeling better. Patient was dialyzed and 2.6 L of fluid was removed. Problem #2 hyperkalemia: His potassium has corrected Problem #3 chronic renal failure presently he doesn't have any nausea or vomiting.  Problem #4 altered mental status: Seems to be improving. Most likely from medications. Problem #5 history of diabetes Problem #6 anemia: His hemoglobin is slightly below target goal but overall stable. Problem #7 metabolic bone disease: His calcium and phosphorus is range. Plan: We'll check his renal panel 2] will make arrangement again for dialysis tomorrow and we'll try to get another 3-2/9 L if his systolic blood pressure is stable. 3] will DC IV Lasix 4] will start on Demadex 60 mg by mouth once a day     LOS: 1 day  Sherlene Rickel S 09/04/2016,8:50 AM

## 2016-09-05 ENCOUNTER — Inpatient Hospital Stay (HOSPITAL_COMMUNITY)
Admit: 2016-09-05 | Discharge: 2016-09-05 | Disposition: A | Discharge status: Home / Self Care | Payer: Medicare Other | Attending: Internal Medicine | Admitting: Internal Medicine

## 2016-09-05 DIAGNOSIS — R569 Unspecified convulsions: Secondary | ICD-10-CM | POA: Diagnosis not present

## 2016-09-05 DIAGNOSIS — G9340 Encephalopathy, unspecified: Secondary | ICD-10-CM | POA: Diagnosis not present

## 2016-09-05 LAB — BASIC METABOLIC PANEL
Anion gap: 8 (ref 5–15)
BUN: 35 mg/dL — AB (ref 6–20)
CHLORIDE: 95 mmol/L — AB (ref 101–111)
CO2: 30 mmol/L (ref 22–32)
CREATININE: 1.52 mg/dL — AB (ref 0.61–1.24)
Calcium: 8.1 mg/dL — ABNORMAL LOW (ref 8.9–10.3)
GFR calc Af Amer: 53 mL/min — ABNORMAL LOW (ref >60)
GFR calc non Af Amer: 46 mL/min — ABNORMAL LOW (ref >60)
Glucose, Bld: 166 mg/dL — ABNORMAL HIGH (ref 65–99)
Potassium: 3.8 mmol/L (ref 3.5–5.1)
SODIUM: 133 mmol/L — AB (ref 135–145)

## 2016-09-05 LAB — CBC
HEMATOCRIT: 28.9 % — AB (ref 39.0–52.0)
HEMOGLOBIN: 9.2 g/dL — AB (ref 13.0–17.0)
MCH: 31.2 pg (ref 26.0–34.0)
MCHC: 31.8 g/dL (ref 30.0–36.0)
MCV: 98 fL (ref 78.0–100.0)
Platelets: 216 10*3/uL (ref 150–400)
RBC: 2.95 MIL/uL — ABNORMAL LOW (ref 4.22–5.81)
RDW: 17 % — ABNORMAL HIGH (ref 11.5–15.5)
WBC: 8.2 10*3/uL (ref 4.0–10.5)

## 2016-09-05 LAB — RENAL FUNCTION PANEL
ANION GAP: 10 (ref 5–15)
Albumin: 1.9 g/dL — ABNORMAL LOW (ref 3.5–5.0)
BUN: 35 mg/dL — ABNORMAL HIGH (ref 6–20)
CHLORIDE: 94 mmol/L — AB (ref 101–111)
CO2: 29 mmol/L (ref 22–32)
Calcium: 8.2 mg/dL — ABNORMAL LOW (ref 8.9–10.3)
Creatinine, Ser: 1.62 mg/dL — ABNORMAL HIGH (ref 0.61–1.24)
GFR calc Af Amer: 49 mL/min — ABNORMAL LOW (ref >60)
GFR calc non Af Amer: 42 mL/min — ABNORMAL LOW (ref >60)
GLUCOSE: 163 mg/dL — AB (ref 65–99)
POTASSIUM: 3.9 mmol/L (ref 3.5–5.1)
Phosphorus: 3.9 mg/dL (ref 2.5–4.6)
Sodium: 133 mmol/L — ABNORMAL LOW (ref 135–145)

## 2016-09-05 LAB — GLUCOSE, CAPILLARY
Glucose-Capillary: 156 mg/dL — ABNORMAL HIGH (ref 65–99)
Glucose-Capillary: 158 mg/dL — ABNORMAL HIGH (ref 65–99)
Glucose-Capillary: 165 mg/dL — ABNORMAL HIGH (ref 65–99)
Glucose-Capillary: 219 mg/dL — ABNORMAL HIGH (ref 65–99)

## 2016-09-05 LAB — LEGIONELLA PNEUMOPHILA SEROGP 1 UR AG: L. pneumophila Serogp 1 Ur Ag: NEGATIVE

## 2016-09-05 MED ORDER — VANCOMYCIN HCL IN DEXTROSE 1-5 GM/200ML-% IV SOLN
1000.0000 mg | INTRAVENOUS | Status: DC
  Administered 2016-09-06 – 2016-09-08 (×3): 1000 mg via INTRAVENOUS
  Filled 2016-09-05 (×4): qty 200

## 2016-09-05 MED ORDER — IPRATROPIUM-ALBUTEROL 0.5-2.5 (3) MG/3ML IN SOLN
3.0000 mL | Freq: Two times a day (BID) | RESPIRATORY_TRACT | Status: DC
  Administered 2016-09-05 – 2016-09-09 (×8): 3 mL via RESPIRATORY_TRACT
  Filled 2016-09-05 (×9): qty 3

## 2016-09-05 MED ORDER — DEXTROSE 5 % IV SOLN
2.0000 g | INTRAVENOUS | Status: DC
  Administered 2016-09-05 – 2016-09-08 (×4): 2 g via INTRAVENOUS
  Filled 2016-09-05 (×7): qty 2

## 2016-09-05 NOTE — Consult Note (Addendum)
[place] Nurse wound consult note Reason for Consult: Consult requested for sacrum wound; this was performed using a remote camera with assistance from the bedside nurse for assessment and measurements. Wound type: Stage 2 pressure injury to inner gluteal fold Pressure Ulcer POA: Yes Measurement: 2X.5X.1cm Wound bed: pink and moist Drainage (amount, consistency, odor) no odor or drainage Periwound: Intact skin surrounding Dressing procedure/placement/frequency: Foam dressing to protect and promote healing.  Discussed plan of care with bedside nurse and the patient's family member at the bedside. Please re-consult if further assistance is needed.  Thank-you,  Julien Girt MSN, [place], [place], [place], [place]

## 2016-09-05 NOTE — Progress Notes (Signed)
Subjective: Interval History: has no complaint of nausea or vomiting. Patient denies any difficulty breathing. His cough is much better.  Objective: Vital signs in last 24 hours: Temp:  [96.4 F (35.8 C)-98.5 F (36.9 C)] 96.4 F (35.8 C) (09/11 0815) Pulse Rate:  [53-82] 69 (09/11 0815) Resp:  [18] 18 (09/11 0449) BP: (110-137)/(46-60) 120/60 (09/11 0815) SpO2:  [77 %-100 %] 77 % (09/11 0815) Weight:  [88.7 kg (195 lb 8.8 oz)] 88.7 kg (195 lb 8.8 oz) (09/11 0449) Weight change: -0.1 kg (-3.5 oz)  Intake/Output from previous day: 09/10 0701 - 09/11 0700 In: 480 [P.O.:480] Out: 2000 [Urine:2000] Intake/Output this shift: No intake/output data recorded.  Generally patient is alert and calmer  today. 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 Iron Studies: No results for input(s): IRON, TIBC, TRANSFERRIN, FERRITIN in the last 72 hours.  Studies/Results: No results found.  I have reviewed the patient's current medications.  Assessment/Plan: Problem #1 difficulty in breathing possibly multifactorial. Presently seems to be feeling better. Patient is presently on Demadex and he had 2000 cc of urine output. Patient is asymptomatic. Problem #2 hyperkalemia: His potassium has corrected Problem #3 chronic renal failure presently he doesn't have any nausea or vomiting.  Problem #4 altered mental status:  Seems to be improving. Most likely from medications. Problem #5 history of diabetes: His blood sugar is reasonably controlled. Problem #6 anemia: His hemoglobin is slightly below target goa. He is on Epogen Problem #7 metabolic bone disease: His calcium and phosphorus is range. Patient is not on a binder. Plan: We'll check his renal panel 2] since patient has a reasonable urine output and is pending creatinine is stable we'll hold his dialysis. 3] will continue with Demadex 60 mg by mouth once a day 4] will check his renal panel in the morning.     LOS: 2 days   Drake Wuertz S 09/05/2016,10:10 AM

## 2016-09-05 NOTE — Procedures (Signed)
[name] A. Merlene Laughter, MD     [email or web address]           HISTORY: The patient presents with episode of unresponsiveness and confusion lasted for about 5 minutes. The spells is also associated post ictally with confusion.  MEDICATIONS: Scheduled Meds: . amiodarone  200 mg Oral Daily  . aspirin  81 mg Oral q morning - 10a  . atorvastatin  80 mg Oral q1800  . ceFEPime (MAXIPIME) IV  2 g Intravenous Q24H  . chlorhexidine  15 mL Mouth Rinse BID  . dextrose  25 mL Intravenous Once  . docusate sodium  100 mg Oral BID  . epoetin (EPOGEN/PROCRIT) injection  10,000 Units Intravenous Q M,W,F-HD  . feeding supplement (PRO-STAT SUGAR FREE 64)  30 mL Oral BID  . heparin  5,000 Units Subcutaneous Q8H  . hydroxychloroquine  200 mg Oral Daily  . insulin aspart  0-9 Units Subcutaneous TID WC  . insulin glargine  8 Units Subcutaneous QHS  . ipratropium-albuterol  3 mL Nebulization BID  . mouth rinse  15 mL Mouth Rinse q12n4p  . metoprolol succinate  25 mg Oral q morning - 10a  . olopatadine  1 drop Both Eyes BID  . omega-3 acid ethyl esters  2 g Oral BID  . polyethylene glycol  17 g Oral BID  . polyvinyl alcohol  2 drop Both Eyes TID  . predniSONE  7.5 mg Oral Q breakfast  . pregabalin  25 mg Oral QHS  . sodium chloride flush  3 mL Intravenous Q12H  . tamsulosin  0.4 mg Oral QHS  . thiamine  100 mg Oral Daily  . torsemide  60 mg Oral Daily  . vancomycin  1,000 mg Intravenous Q24H  . vitamin C  500 mg Oral Daily  . zinc sulfate  220 mg Oral Daily   Continuous Infusions:  PRN Meds:.sodium chloride, sodium chloride, sodium chloride, acetaminophen, alteplase, artificial tears, heparin, ondansetron (ZOFRAN) IV, oxyCODONE, sodium chloride flush  Prior to Admission medications   Medication Sig Start Date End Date Taking? Authorizing Provider  acetaminophen (TYLENOL) 325 MG tablet Take 650 mg by mouth every 6 (six) hours as needed for mild pain or moderate pain.    Yes  Historical Provider, MD  albuterol (PROVENTIL) (2.5 MG/3ML) 0.083% nebulizer solution Take 2.5 mg by nebulization every 4 (four) hours as needed for wheezing or shortness of breath.   Yes Historical Provider, MD  Amino Acids-Protein Hydrolys (PRO-STAT 64 PO) Take 30 mLs by mouth 2 (two) times daily.   Yes Historical Provider, MD  amiodarone (PACERONE) 200 MG tablet Take 200 mg by mouth daily.   Yes Historical Provider, MD  Artificial Tear Ointment (REFRESH P.M. OP) Apply 1 drop to eye daily as needed (FOR DY EYES). ONE APPLICATION BOTH EYES. For dry eyes not relieved by theratears every 6 hours    Yes Historical Provider, MD  aspirin 81 MG chewable tablet Chew 81 mg by mouth every morning.    Yes Historical Provider, MD  atorvastatin (LIPITOR) 80 MG tablet Take 80 mg by mouth daily.   Yes Historical Provider, MD  Carboxymethylcellulose Sodium (THERATEARS) 0.25 % SOLN Apply 2 drops to both eyes three times a day   Yes Historical Provider, MD  docusate sodium (COLACE) 100 MG capsule Take 100 mg by mouth 2 (two) times daily.   Yes Historical Provider, MD  hydroxychloroquine (PLAQUENIL) 200 MG tablet Take 200 mg by mouth daily.    Yes Historical Provider, MD  insulin NPH Human (HUMULIN N) 100 UNIT/ML injection Give 5 units subcutaneous twice a day   Yes Historical Provider, MD  Melatonin 3 MG TABS Take 2 tablets by mouth at bedtime   Yes Historical Provider, MD  metoprolol succinate (TOPROL-XL) 25 MG 24 hr tablet Take 25 mg by mouth every morning.   Yes Historical Provider, MD  Multiple Vitamins-Minerals (THERA-M ENHANCED PO) 1 tablet by mouth daily   Yes Historical Provider, MD  nystatin cream (MYCOSTATIN) Apply 1 application topically daily. Mix with zinc oxide and apply to sacrum daily & as needed   Yes Historical Provider, MD  olopatadine (PATANOL) 0.1 % ophthalmic solution Place 1 drop into both eyes 2 (two) times daily.   Yes Historical Provider, MD  omega-3 acid ethyl esters (LOVAZA) 1 g capsule  Take 2 g by mouth 2 (two) times daily.   Yes Historical Provider, MD  oxyCODONE (OXY IR/ROXICODONE) 5 MG immediate release tablet Take 1 tablet (5 mg total) by mouth every 12 (twelve) hours as needed for severe pain. 08/31/16  Yes Gildardo Cranker, DO  OXYGEN Inhale 2 L into the lungs continuous. O 2 at 2 LPM via n/c to keep O2 sat above 90 % every shift    Yes Historical Provider, MD  polyethylene glycol powder (MIRALAX) powder Take 17 g by mouth 2 (two) times daily. 08/14/14  Yes Rexene Alberts, MD  predniSONE (DELTASONE) 2.5 MG tablet Take 3 tablets (7.5 mg total) by mouth daily with breakfast. 08/28/16  Yes Orvan Falconer, MD  pregabalin (LYRICA) 25 MG capsule Take one capsule by mouth at bedtime Patient taking differently: Take 25 mg by mouth at bedtime. Take one capsule by mouth at bedtime 08/31/16  Yes Gildardo Cranker, DO  Tamsulosin HCl (FLOMAX) 0.4 MG CAPS Take 0.4 mg by mouth at bedtime.    Yes Historical Provider, MD  thiamine (VITAMIN B-1) 100 MG tablet Take 100 mg by mouth daily.   Yes Historical Provider, MD  traZODone (DESYREL) 50 MG tablet Take 50 mg by mouth at bedtime.   Yes Historical Provider, MD  vitamin C (ASCORBIC ACID) 500 MG tablet Take 500 mg by mouth daily.   Yes Historical Provider, MD  Zinc Oxide 10 % OINT Mix with Nystatin and apply to sacrum daily & prn as ordered once a day   Yes Historical Provider, MD  zinc sulfate 220 (50 Zn) MG capsule Take 220 mg by mouth daily.   Yes Historical Provider, MD      ANALYSIS: A 16 channel recording using standard 10 20 measurements is conducted for 22 minutes. The background activity is replete with the 3-4 Hz delta slowing. The background activity occasionally gets to 8 Hz but is replete with 3-4 delta activity. Photic stimulation and hyperventilation are not conducted. There is triphasic waves seen throughout the recording. There is also frontal intermittent rhythmic delta activity observed. No focal or lateralized slowing is observed. No  epileptiform activities observed.    IMPRESSION: This recording is abnormal for the following reasons: 1. Triphasic waves typically seen  in uremia/renal failure or hepatic failure. 2. Frontal intermittent rhythmic delta activity typically seen in toxic metabolic encephalopathies.  There is no epileptiform activity is observed.       Cailen Mihalik A. Merlene [name], M.D.  Diplomate, Tax adviser of Psychiatry and Neurology ( Neurology).

## 2016-09-05 NOTE — Care Management Note (Signed)
Case Management Note  Patient Details  Name: [name] MRN: [id number] Date of Birth: [birth year]  Subjective/Objective:                  Pt admitted with pneumonia. Pt is from [facility]. Anticipate return to SNF at [place] is aware and will make arrangements for return to facility at [place].   Action/Plan: No CM needs.   Expected Discharge Date:    09/06/2016              Expected Discharge Plan:  Skilled Nursing Facility  In-House Referral:  Clinical Social Work  Discharge planning Services  CM Consult  Post Acute Care Choice:  NA Choice offered to:  NA  DME Arranged:    DME Agency:     HH Arranged:    [place] Agency:     Status of Service:  Completed, signed off  If discussed at [employer], dates discussed:    Additional Comments:  Sherald Barge, RN 09/05/2016, 10:57 AM

## 2016-09-05 NOTE — Evaluation (Signed)
Clinical/Bedside Swallow Evaluation Patient Details  Name: [name] MRN: [id number] Date of Birth: 1951-12-14  Today's Date: 09/05/2016 Time: SLP Start Time (ACUTE ONLY): 1300 SLP Stop Time (ACUTE ONLY): 1327 SLP Time Calculation (min) (ACUTE ONLY): 27 min  Past Medical History:  Past Medical History:  Diagnosis Date  . Chronic back pain   . Chronic neck pain   . Coronary artery disease 1993   angioplasty after AMI  . Diabetes mellitus   . Diabetic neuropathy ([place])   . Hyperkalemia   . Hypertension   . Pancytopenia ([place]) 08/13/2014  . 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  . Rheumatoid arthritis(714.0)   . Seizure ([place]) 08/13/2014   Pt denies   Past Surgical History:  Past Surgical History:  Procedure Laterality Date  . CARDIAC SURGERY    . FRACTURE SURGERY    . JOINT REPLACEMENT     HPI:  66 y/o man admitted on 9/8 from [facility] with SOB. Found to have pulmonary edema as well as possible HCAP. Overnight has developed more confusing, agitation and delirium. Confusion has resolved.   Assessment / Plan / Recommendation Clinical Impression  Pt known to SLP from previous admission (last week). He was readmitted from [facility] on Friday. Clinical swallow evaluation completed at bedside with wife present this date. Subjectively, pt looks better this visit compared to when seen last week (alert, talkative, clear vocal quality). Pt and wife report good tolerance of diet earlier today (no coughing or SOB). Oral motor examination is WFL. Pt presented with ice chips, thin water, nectar-thick juice, puree, and regular textures with only subtle, delayed throat clearing (only once or twice). There was no observable difference between thins and nectars. Pt benefited from liquid wash to help clear regular textures from oral cavity. Aspiration and reflux precautions reviewed with emphasis on good oral hygiene. Current chest x-ray shows: Prior CABG.  Cardiomegaly with bilateral pulmonary infiltrates consistent with pulmonary edema. Right pleural effusion. Findings consistent with congestive heart failure. Bilateral pneumonia cannot be excluded. SLP will follow while in acute setting for diet tolerance and ongoing education. Discussed completing objective assessment (MBSS) with pt, however fluoro room not ready yet at [facility] and would need to go to [facility] or [facility]. Pt had two MBSS this summer while at [facility] and was ultimately released on mechanical soft diet with thin liquids. SLP will follow.    Aspiration Risk  Mild aspiration risk    Diet Recommendation Dysphagia 3 (Mech soft);Thin liquid   Liquid Administration via: Cup;Straw Medication Administration: Whole meds with liquid Supervision: Full supervision/cueing for compensatory strategies;Staff to assist with self feeding Compensations: Slow rate;Small sips/bites;Lingual sweep for clearance of pocketing;Multiple dry swallows after each bite/sip Postural Changes: Seated upright at 90 degrees;Remain upright for at least 30 minutes after po intake    Other  Recommendations Oral Care Recommendations: Oral care BID Other Recommendations: Clarify dietary restrictions   Follow up Recommendations  Skilled Nursing facility    Frequency and Duration min 1 x/week  1 week       Prognosis Prognosis for Safe Diet Advancement: Fair      Swallow Study   General Date of Onset: 09/02/16 HPI: 65 y/o man admitted on 9/8 from [facility] with SOB. Found to have pulmonary edema as well as possible HCAP. Overnight has developed more confusing, agitation and delirium. Confusion has resolved. Type of Study: Bedside Swallow Evaluation Previous Swallow Assessment: 08/30/2016: BSE and had previous  MBSS x2 at [facility] over the summer Diet Prior to this Study: Regular;Thin liquids Temperature Spikes Noted: No Respiratory Status: Room air History of Recent Intubation:  No Behavior/Cognition: Alert;Cooperative;Pleasant mood Oral Cavity Assessment: Within Functional Limits Oral Care Completed by SLP: Recent completion by staff Oral Cavity - Dentition: Dentures, top;Dentures, bottom Vision: Functional for self-feeding Self-Feeding Abilities: Able to feed self Patient Positioning: Upright in bed Baseline Vocal Quality: Normal Volitional Cough: Strong;Congested Volitional Swallow: Able to elicit    Oral/Motor/Sensory Function Overall Oral Motor/Sensory Function: Within functional limits   Ice Chips Ice chips: Within functional limits Presentation: Spoon   Thin Liquid Thin Liquid: Within functional limits Presentation: Cup;Straw    Nectar Thick Nectar Thick Liquid: Within functional limits Presentation: Straw   Honey Thick Honey Thick Liquid: Not tested   Puree Puree: Within functional limits Presentation: Spoon   Solid      Solid: Within functional limits Presentation: Spoon Other Comments: mech soft, mild lingual residuals which clear with liquid wash       Thank you,  Genene Churn, [place]  Damon Hargrove 09/05/2016,4:15 PM

## 2016-09-05 NOTE — Progress Notes (Signed)
EEG Completed; Results Pending  

## 2016-09-05 NOTE — Clinical Social Work Note (Signed)
Clinical Social Work Assessment  Patient Details  Name: [name] MRN: [id number] Date of Birth: 08/11/1951  Date of referral:  09/05/16               Reason for consult:  Discharge Planning                Permission sought to share information with:  Facility Art therapist granted to share information::  Yes, Verbal Permission Granted  Name::        Agency::  [place]  Relationship::  facility  Contact Information:     Housing/Transportation Living arrangements for the past 2 months:  Junction of Information:  Facility, Spouse Patient Interpreter Needed:  None Criminal Activity/Legal Involvement Pertinent to Current Situation/Hospitalization:  No - Comment as needed Significant Relationships:  Spouse Lives with:  Facility Resident Do you feel safe going back to the place where you live?  Yes Need for family participation in patient care:  Yes (Comment)  Care giving concerns:  None reported. Pt is resident at [facility].    Social Worker assessment / plan:  CSW met with pt's wife at bedside. Pt sleeping during assessment. Pt known to CSW from admission last week. He has been a resident at [facility] for over a month. Pt was on dialysis Monday, Wednesday, and Friday schedule. However, he has improved and stopped dialysis. Per [name] at facility, pt is skilled level of care and okay to return. No FL2 needed. Pt did require dialysis on Saturday. Discussed with nephrology who reports this will not be necessary outpatient. CSW notified [place].   Employment status:  Retired Forensic scientist:  Medicare PT Recommendations:  Not assessed at this time [place] / Referral to community resources:  Other (Comment Required) (Return to [facility])  Patient/Family's Response to care:  [name] requests that pt return to [facility] when medically stable.   Patient/Family's Understanding of and Emotional Response to Diagnosis, Current Treatment, and Prognosis:  [name] appears  to have good understanding of medical history. She is pleased that pt will not require dialysis on outpatient basis.   Emotional Assessment Appearance:  Appears stated age Attitude/Demeanor/Rapport:  Unable to Assess Affect (typically observed):  Unable to Assess Orientation:    Alcohol / Substance use:  Not Applicable Psych involvement (Current and /or in the community):  No (Comment)  Discharge Needs  Concerns to be addressed:  Discharge Planning Concerns Readmission within the last 30 days:  Yes Current discharge risk:  None Barriers to Discharge:  Continued Medical Work up   [employer], [employer], [place] 09/05/2016, 10:29 AM [phone]

## 2016-09-05 NOTE — Care Management Important Message (Signed)
Important Message  Patient Details  Name: [name] MRN: [id number] Date of Birth: June 04, 1951   Medicare Important Message Given:  Yes    Sherald Barge, RN 09/05/2016, 10:57 AM

## 2016-09-05 NOTE — Progress Notes (Signed)
PROGRESS NOTE    [name]  FOY:[id number] DOB: [birth year] DOA: 09/02/2016 PCP: Lala Lund, MD     Brief Narrative:  65 y/o man admitted on 9/8 from [facility] with SOB. Found to have pulmonary edema as well as possible HCAP.    Assessment & Plan:   Principal Problem:   HCAP (healthcare-associated pneumonia) Active Problems:   Rheumatoid arthritis ([place])   Hypertension   Insulin dependent diabetes mellitus ([place])   Seizure ([place])   Anemia of chronic disease   Acute on chronic diastolic CHF (congestive heart failure) (HCC)   Acute renal failure superimposed on stage 3 chronic kidney disease (HCC)   Hyperkalemia   Sacral decubitus ulcer   Acute Encephalopathy -Likely due to medications and multiple metabolic abnormalities. -Has resolved and is back to baseline per wife at bedside. -Had an episode yesterday lasting 5 minutes of eyes rolled back and difficulty speaking followed by 10 minutes were he had difficulty recognizing his family members. ?partial seizure. EEG and neurology consult requested  HCAP -Continue vanc/cefepime for now. -Cx data pending. -Has copious sputum production.  Dysphagia -ST recommends D3 diet with thin liquids.  Acute on CKD Stage III -Has been dialyzed on and off acutely. -Had HD 9/9. -HD currently on hold.  Hyperkalemia -Resolved with HD.  Acute on Chronic Diastolic CHF -ECHO: EF 76-72% with grade 2 diastolic dysfunction. -Continue lasix at nephrology's recommendations.  IDDM -Improved with increased lantus dose.  RA -Continue prednisone and plaquenil. -Received high dose solumedrol in ED (may be constributing to acute encephalopathy).    DVT prophylaxis: SQ heparin Code Status: full code Family Communication: wife and granddaughters at bedside updated on plan of care Disposition Plan: back to SNF once ready for DC  Consultants:   Nephrology  Procedures:   None  Antimicrobials:   Vancomycin 9/8>>  Cefepime  9/8>>    Subjective: Awake, conversant, copious sputum production.  Objective: Vitals:   09/05/16 0804 09/05/16 0815 09/05/16 1400 09/05/16 1525  BP:  120/60 (!) 122/40 (!) 156/122  Pulse:  69 86 (!) 136  Resp:   18   Temp:  (!) 96.4 F (35.8 C) 97.6 F (36.4 C) 97.6 F (36.4 C)  TempSrc:  Oral Oral Oral  SpO2: 99% (!) 77% 100% 92%  Weight:      Height:        Intake/Output Summary (Last 24 hours) at 09/05/16 1636 Last data filed at 09/05/16 1000  Gross per 24 hour  Intake              120 ml  Output             1925 ml  Net            -1805 ml   Filed Weights   09/03/16 1245 09/04/16 0557 09/05/16 0449  Weight: 88.8 kg (195 lb 12.3 oz) 87.4 kg (192 lb 10.9 oz) 88.7 kg (195 lb 8.8 oz)    Examination:  General exam: AA Ox3 Respiratory system: Clear to auscultation. Respiratory effort normal. Cardiovascular system:RRR. No murmurs, rubs, gallops. Gastrointestinal system: Abdomen is nondistended, soft and nontender. No organomegaly or masses felt. Normal bowel sounds heard. Central nervous system: unable to assess given current mental state Extremities: 2++ edema on right, s/p left BKA, +pedal pulses     Data Reviewed: I have personally reviewed following labs and imaging studies  CBC:  Recent Labs Lab 09/01/16 0735 09/02/16 1559 09/03/16 1500 09/04/16 0627 09/05/16 0558  WBC 8.8  10.1 9.2 8.5 8.2  NEUTROABS  --  8.6*  --   --   --   HGB 9.5* 9.8* 8.9* 9.2* 9.2*  HCT 29.9* 30.5* 27.8* 29.3* 28.9*  MCV 96.1 96.2 96.2 99.0 98.0  PLT 232 248 263 224 557   Basic Metabolic Panel:  Recent Labs Lab 08/30/16 0613 09/01/16 0735 09/02/16 1559  09/03/16 0501 09/03/16 0745 09/03/16 1212 09/04/16 0627 09/05/16 0558  NA 129* 131* 129*  --  130*  --   --  136  135 133*  133*  K 4.8 4.8 5.8*  < > 5.7*  5.7* 5.7* 5.4* 3.8  3.8 3.9  3.8  CL 95* 94* 91*  --  93*  --   --  96*  96* 94*  95*  CO2 29 31 29   --  28  --   --  31  31 29  30   GLUCOSE 109*  151* 227*  --  297*  --   --  168*  168* 163*  166*  BUN 34* 43* 48*  --  53*  --   --  27*  28* 35*  35*  CREATININE 1.89* 2.06* 2.11*  --  2.06*  --   --  1.31*  1.26* 1.62*  1.52*  CALCIUM 8.3* 8.1* 8.4*  --  8.6*  --   --  8.5*  8.4* 8.2*  8.1*  PHOS 4.3  --   --   --   --   --   --  3.6 3.9  < > = values in this interval not displayed. GFR: Estimated Creatinine Clearance: 52 mL/min (by C-G formula based on SCr of 1.52 mg/dL). Liver Function Tests:  Recent Labs Lab 09/04/16 0627 09/05/16 0558  ALBUMIN 2.1* 1.9*   No results for input(s): LIPASE, AMYLASE in the last 168 hours. No results for input(s): AMMONIA in the last 168 hours. Coagulation Profile: No results for input(s): INR, PROTIME in the last 168 hours. Cardiac Enzymes: No results for input(s): CKTOTAL, CKMB, CKMBINDEX, TROPONINI in the last 168 hours. BNP (last 3 results) No results for input(s): PROBNP in the last 8760 hours. HbA1C: No results for input(s): HGBA1C in the last 72 hours. CBG:  Recent Labs Lab 09/04/16 1817 09/04/16 2054 09/05/16 0717 09/05/16 1108 09/05/16 1633  GLUCAP 179* 220* 156* 158* 165*   Lipid Profile: No results for input(s): CHOL, HDL, LDLCALC, TRIG, CHOLHDL, LDLDIRECT in the last 72 hours. Thyroid Function Tests: No results for input(s): TSH, T4TOTAL, FREET4, T3FREE, THYROIDAB in the last 72 hours. Anemia Panel: No results for input(s): VITAMINB12, FOLATE, FERRITIN, TIBC, IRON, RETICCTPCT in the last 72 hours. Urine analysis:    Component Value Date/Time   COLORURINE AMBER (A) 08/26/2016 0940   APPEARANCEUR CLEAR 08/26/2016 0940   LABSPEC 1.020 08/26/2016 0940   PHURINE 6.0 08/26/2016 0940   GLUCOSEU NEGATIVE 08/26/2016 0940   HGBUR TRACE (A) 08/26/2016 0940   BILIRUBINUR NEGATIVE 08/26/2016 0940   KETONESUR NEGATIVE 08/26/2016 0940   PROTEINUR 30 (A) 08/26/2016 0940   UROBILINOGEN 0.2 08/12/2014 2358   NITRITE NEGATIVE 08/26/2016 0940   LEUKOCYTESUR TRACE (A)  08/26/2016 0940   Sepsis Labs: @LABRCNTIP (procalcitonin:4,lacticidven:4)  ) Recent Results (from the past 240 hour(s))  Culture, respiratory (NON-Expectorated)     Status: None (Preliminary result)   Collection Time: 09/03/16  7:50 PM  Result Value Ref Range Status   Specimen Description SPUTUM  Final   Special Requests NONE  Final   Gram Stain   Final  ABUNDANT WBC PRESENT,BOTH PMN AND MONONUCLEAR ABUNDANT GRAM POSITIVE COCCI IN PAIRS MODERATE GRAM POSITIVE RODS Performed at [facility]    Culture PENDING  Incomplete   Report Status PENDING  Incomplete         Radiology Studies: No results found.      Scheduled Meds: . amiodarone  200 mg Oral Daily  . aspirin  81 mg Oral q morning - 10a  . atorvastatin  80 mg Oral q1800  . ceFEPime (MAXIPIME) IV  2 g Intravenous Q24H  . chlorhexidine  15 mL Mouth Rinse BID  . dextrose  25 mL Intravenous Once  . docusate sodium  100 mg Oral BID  . epoetin (EPOGEN/PROCRIT) injection  10,000 Units Intravenous Q M,W,F-HD  . feeding supplement (PRO-STAT SUGAR FREE 64)  30 mL Oral BID  . heparin  5,000 Units Subcutaneous Q8H  . hydroxychloroquine  200 mg Oral Daily  . insulin aspart  0-9 Units Subcutaneous TID WC  . insulin glargine  8 Units Subcutaneous QHS  . ipratropium-albuterol  3 mL Nebulization BID  . mouth rinse  15 mL Mouth Rinse q12n4p  . metoprolol succinate  25 mg Oral q morning - 10a  . olopatadine  1 drop Both Eyes BID  . omega-3 acid ethyl esters  2 g Oral BID  . polyethylene glycol  17 g Oral BID  . polyvinyl alcohol  2 drop Both Eyes TID  . predniSONE  7.5 mg Oral Q breakfast  . pregabalin  25 mg Oral QHS  . sodium chloride flush  3 mL Intravenous Q12H  . tamsulosin  0.4 mg Oral QHS  . thiamine  100 mg Oral Daily  . torsemide  60 mg Oral Daily  . vancomycin  1,000 mg Intravenous Q24H  . vitamin C  500 mg Oral Daily  . zinc sulfate  220 mg Oral Daily   Continuous Infusions:    LOS: 2 days     Time spent: 25 minutes. Greater than 50% of this time was spent in direct contact with the patient coordinating care.     Lelon Frohlich, MD [facility] Pager [phone]  If 7PM-7AM, please contact night-coverage [email or web address] Password [facility] 09/05/2016, 4:36 PM

## 2016-09-05 NOTE — Progress Notes (Signed)
Pharmacy Antibiotic Note  [name] is a 65 y.o. male admitted on 09/02/2016 with pneumonia.  Pharmacy has been consulted for [name] [employer] AND CEFEPIME dosing.  Nephrology notes reviewed, pt was started on dialysis but that is now on hold due to improved renal fxn.   Plan: Change Vancomycin to 1000mg  IV q24h Check trough level at SS Cefepime 2gm IV q24h Monitor labs, renal fxn, progress and c/s F/U duration of therapy, deescalate ABX when appropriate  Height: 5\' 9"  (175.3 cm) Weight: 195 lb 8.8 oz (88.7 kg) IBW/kg (Calculated) : 70.7  Temp (24hrs), Avg:97.7 F (36.5 C), Min:96.4 F (35.8 C), Max:98.5 F (36.9 C)   Recent Labs Lab 09/01/16 0735 09/02/16 1559 09/03/16 0501 09/03/16 1500 09/04/16 0627 09/05/16 0558  WBC 8.8 10.1  --  9.2 8.5 8.2  CREATININE 2.06* 2.11* 2.06*  --  1.31*  1.26* 1.62*  1.52*    Estimated Creatinine Clearance: 52 mL/min (by C-G formula based on SCr of 1.52 mg/dL).    Allergies  Allergen Reactions  . Sulfa Antibiotics Rash   Antimicrobials this admission: Vancomycin 9/8 >>  Cefepime 9/8 >>   Dose adjustments this admission: 9/10:  Vanc and Cefepime changed to dialysis dosing as pt was started on scheduled dialysis sessions:  MWF >> then on 9/11 changed back to q24h dosing due to dialysis stopped and improved renal fxn.  Recent Results (from the past 240 hour(s))  Culture, respiratory (NON-Expectorated)     Status: None (Preliminary result)   Collection Time: 09/03/16  7:50 PM  Result Value Ref Range Status   Specimen Description SPUTUM  Final   Special Requests NONE  Final   Gram Stain   Final    ABUNDANT WBC PRESENT,BOTH PMN AND MONONUCLEAR ABUNDANT GRAM POSITIVE COCCI IN PAIRS MODERATE GRAM POSITIVE RODS Performed at [name]    Culture PENDING  Incomplete   Report Status PENDING  Incomplete   Thank you for allowing pharmacy to be a part of this patient's care.  [name] A 09/05/2016 12:21 PM

## 2016-09-05 NOTE — Consult Note (Signed)
Putnam A. Merlene Laughter, MD     [email or web address]          [name] is an 65 y.o. male.   ASSESSMENT/PLAN: Self-limiting spell of confusion and unresponsiveness. Etiology unclear but could be medication effect, effective of confusional arousal although sleep, effective acute medical illness and complex partial seizures. EEG has been negative and therefore we'll suggest no medication at this time. Continue to see the patient does. Given the self-limiting nature, intracranial processes are unlikely.     The patient is a 65 year old white male who is being treated for community-acquired pneumonia. He presented with shortness of breath. The patient had an episode yesterday where he awoke him from sleep and was confused and unresponsive. This lasted for about 5 minutes. Patient was confused for about 20 minutes afterwards. He has never had any spells like this. The patient does not report focal neurological deficits. There is no focal numbness or weakness. There is no previous history of strokes. No head injuries. He denies any headaches. He reports that the shortness of breath has improved significantly. No chest pain is reported. 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 He also has had some toe amplitude on the right side. He reports global weakness from his illness with that has gotten better since he's been hospitalized. The review of systems otherwise negative.    GENERAL: Very pleasant man in no acute distress. He is on nasal oxygen.  HEENT: Supple. Atraumatic normocephalic.   ABDOMEN: soft  EXTREMITIES: No edema. Couple times on the right are amputated. 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   BACK: Normal.  SKIN: Normal by inspection.    MENTAL STATUS: Alert and oriented X4. Speech, language and cognition are generally intact. Judgment and insight normal.   CRANIAL NERVES: Pupils are equal, round and reactive to  light and accommodation; extra ocular movements are full, there is no significant nystagmus; visual fields are full; upper and lower facial muscles are normal in strength and symmetric, there is no flattening of the nasolabial folds; tongue is midline; uvula is midline; shoulder elevation is normal.  MOTOR: Normal tone, bulk and strength in arms; no pronator drift. Leg strength is graded as 3/5.  COORDINATION: Left finger to nose is normal, right finger to nose is normal, No rest tremor; no intention tremor; no postural tremor; no bradykinesia.  REFLEXES: Deep tendon reflexes are symmetrical and normal.   SENSATION: Normal to light touch.      Blood pressure (!) 156/122, pulse (!) 136, temperature 97.6 F (36.4 C), temperature source Oral, resp. rate 18, height '5\' 9"'  (1.753 m), weight 195 lb 8.8 oz (88.7 kg), SpO2 92 %.  Past Medical History:  Diagnosis Date  . Chronic back pain   . Chronic neck pain   . Coronary artery disease 1993   angioplasty after AMI  . Diabetes mellitus   . Diabetic neuropathy ([place])   . Hyperkalemia   . Hypertension   . Pancytopenia ([place]) 08/13/2014  . 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  . Rheumatoid arthritis(714.0)   . Seizure ([place]) 08/13/2014   Pt denies    Past Surgical History:  Procedure Laterality Date  . CARDIAC SURGERY    . FRACTURE SURGERY    . JOINT REPLACEMENT      Family History  Problem Relation Age of Onset  . Diabetes Father   . Dementia Father   .  Heart attack Brother     multiple brothers with MIs  . Hypertension Mother   . Stroke Mother     Social History:  reports that he has never smoked. He has never used smokeless tobacco. He reports that he does not drink alcohol or use drugs.  Allergies:  Allergies  Allergen Reactions  . Sulfa Antibiotics Rash    Medications: Prior to Admission medications   Medication Sig Start Date End Date Taking? Authorizing Provider    acetaminophen (TYLENOL) 325 MG tablet Take 650 mg by mouth every 6 (six) hours as needed for mild pain or moderate pain.    Yes Historical Provider, MD  albuterol (PROVENTIL) (2.5 MG/3ML) 0.083% nebulizer solution Take 2.5 mg by nebulization every 4 (four) hours as needed for wheezing or shortness of breath.   Yes Historical Provider, MD  Amino Acids-Protein Hydrolys (PRO-STAT 64 PO) Take 30 mLs by mouth 2 (two) times daily.   Yes Historical Provider, MD  amiodarone (PACERONE) 200 MG tablet Take 200 mg by mouth daily.   Yes Historical Provider, MD  Artificial Tear Ointment (REFRESH P.M. OP) Apply 1 drop to eye daily as needed (FOR DY EYES). ONE APPLICATION BOTH EYES. For dry eyes not relieved by theratears every 6 hours    Yes Historical Provider, MD  aspirin 81 MG chewable tablet Chew 81 mg by mouth every morning.    Yes Historical Provider, MD  atorvastatin (LIPITOR) 80 MG tablet Take 80 mg by mouth daily.   Yes Historical Provider, MD  Carboxymethylcellulose Sodium (THERATEARS) 0.25 % SOLN Apply 2 drops to both eyes three times a day   Yes Historical Provider, MD  docusate sodium (COLACE) 100 MG capsule Take 100 mg by mouth 2 (two) times daily.   Yes Historical Provider, MD  hydroxychloroquine (PLAQUENIL) 200 MG tablet Take 200 mg by mouth daily.    Yes Historical Provider, MD  insulin NPH Human (HUMULIN N) 100 UNIT/ML injection Give 5 units subcutaneous twice a day   Yes Historical Provider, MD  Melatonin 3 MG TABS Take 2 tablets by mouth at bedtime   Yes Historical Provider, MD  metoprolol succinate (TOPROL-XL) 25 MG 24 hr tablet Take 25 mg by mouth every morning.   Yes Historical Provider, MD  Multiple Vitamins-Minerals (THERA-M ENHANCED PO) 1 tablet by mouth daily   Yes Historical Provider, MD  nystatin cream (MYCOSTATIN) Apply 1 application topically daily. Mix with zinc oxide and apply to sacrum daily & as needed   Yes Historical Provider, MD  olopatadine (PATANOL) 0.1 % ophthalmic solution  Place 1 drop into both eyes 2 (two) times daily.   Yes Historical Provider, MD  omega-3 acid ethyl esters (LOVAZA) 1 g capsule Take 2 g by mouth 2 (two) times daily.   Yes Historical Provider, MD  oxyCODONE (OXY IR/ROXICODONE) 5 MG immediate release tablet Take 1 tablet (5 mg total) by mouth every 12 (twelve) hours as needed for severe pain. 08/31/16  Yes Gildardo Cranker, DO  OXYGEN Inhale 2 L into the lungs continuous. O 2 at 2 LPM via n/c to keep O2 sat above 90 % every shift    Yes Historical Provider, MD  polyethylene glycol powder (MIRALAX) powder Take 17 g by mouth 2 (two) times daily. 08/14/14  Yes Rexene Alberts, MD  predniSONE (DELTASONE) 2.5 MG tablet Take 3 tablets (7.5 mg total) by mouth daily with breakfast. 08/28/16  Yes Orvan Falconer, MD  pregabalin (LYRICA) 25 MG capsule Take one capsule by mouth at bedtime  Patient taking differently: Take 25 mg by mouth at bedtime. Take one capsule by mouth at bedtime 08/31/16  Yes Gildardo Cranker, DO  Tamsulosin HCl (FLOMAX) 0.4 MG CAPS Take 0.4 mg by mouth at bedtime.    Yes Historical Provider, MD  thiamine (VITAMIN B-1) 100 MG tablet Take 100 mg by mouth daily.   Yes Historical Provider, MD  traZODone (DESYREL) 50 MG tablet Take 50 mg by mouth at bedtime.   Yes Historical Provider, MD  vitamin C (ASCORBIC ACID) 500 MG tablet Take 500 mg by mouth daily.   Yes Historical Provider, MD  Zinc Oxide 10 % OINT Mix with Nystatin and apply to sacrum daily & prn as ordered once a day   Yes Historical Provider, MD  zinc sulfate 220 (50 Zn) MG capsule Take 220 mg by mouth daily.   Yes Historical Provider, MD    Scheduled Meds: . amiodarone  200 mg Oral Daily  . aspirin  81 mg Oral q morning - 10a  . atorvastatin  80 mg Oral q1800  . ceFEPime (MAXIPIME) IV  2 g Intravenous Q24H  . chlorhexidine  15 mL Mouth Rinse BID  . dextrose  25 mL Intravenous Once  . docusate sodium  100 mg Oral BID  . epoetin (EPOGEN/PROCRIT) injection  10,000 Units Intravenous Q M,W,F-HD  .  feeding supplement (PRO-STAT SUGAR FREE 64)  30 mL Oral BID  . heparin  5,000 Units Subcutaneous Q8H  . hydroxychloroquine  200 mg Oral Daily  . insulin aspart  0-9 Units Subcutaneous TID WC  . insulin glargine  8 Units Subcutaneous QHS  . ipratropium-albuterol  3 mL Nebulization BID  . mouth rinse  15 mL Mouth Rinse q12n4p  . metoprolol succinate  25 mg Oral q morning - 10a  . olopatadine  1 drop Both Eyes BID  . omega-3 acid ethyl esters  2 g Oral BID  . polyethylene glycol  17 g Oral BID  . polyvinyl alcohol  2 drop Both Eyes TID  . predniSONE  7.5 mg Oral Q breakfast  . pregabalin  25 mg Oral QHS  . sodium chloride flush  3 mL Intravenous Q12H  . tamsulosin  0.4 mg Oral QHS  . thiamine  100 mg Oral Daily  . torsemide  60 mg Oral Daily  . vancomycin  1,000 mg Intravenous Q24H  . vitamin C  500 mg Oral Daily  . zinc sulfate  220 mg Oral Daily   Continuous Infusions:  PRN Meds:.sodium chloride, sodium chloride, sodium chloride, acetaminophen, alteplase, artificial tears, heparin, ondansetron (ZOFRAN) IV, oxyCODONE, sodium chloride flush     Results for orders placed or performed during the hospital encounter of 09/02/16 (from the past 48 hour(s))  Glucose, capillary     Status: Abnormal   Collection Time: 09/03/16  4:42 PM  Result Value Ref Range   Glucose-Capillary 179 (H) 65 - 99 mg/dL   Comment 1 Notify RN    Comment 2 Document in Chart   Culture, respiratory (NON-Expectorated)     Status: None (Preliminary result)   Collection Time: 09/03/16  7:50 PM  Result Value Ref Range   Specimen Description SPUTUM    Special Requests NONE    Gram Stain      ABUNDANT WBC PRESENT,BOTH PMN AND MONONUCLEAR ABUNDANT GRAM POSITIVE COCCI IN PAIRS MODERATE GRAM POSITIVE RODS Performed at [facility]    Culture PENDING    Report Status PENDING   Glucose, capillary     Status:  Abnormal   Collection Time: 09/03/16  8:11 PM  Result Value Ref Range   Glucose-Capillary 180 (H)  65 - 99 mg/dL   Comment 1 Notify RN   Basic metabolic panel     Status: Abnormal   Collection Time: 09/04/16  6:27 AM  Result Value Ref Range   Sodium 135 135 - 145 mmol/L   Potassium 3.8 3.5 - 5.1 mmol/L    Comment: DELTA CHECK NOTED   Chloride 96 (L) 101 - 111 mmol/L   CO2 31 22 - 32 mmol/L   Glucose, Bld 168 (H) 65 - 99 mg/dL   BUN 28 (H) 6 - 20 mg/dL   Creatinine, Ser 1.26 (H) 0.61 - 1.24 mg/dL   Calcium 8.4 (L) 8.9 - 10.3 mg/dL   GFR calc non Af Amer 57 (L) >60 mL/min   GFR calc Af Amer >60 >60 mL/min    Comment: (NOTE) The eGFR has been calculated using the CKD EPI equation. This calculation has not been validated in all clinical situations. eGFR's persistently <60 mL/min signify possible Chronic Kidney Disease.    Anion gap 8 5 - 15  CBC     Status: Abnormal   Collection Time: 09/04/16  6:27 AM  Result Value Ref Range   WBC 8.5 4.0 - 10.5 K/uL   RBC 2.96 (L) 4.22 - 5.81 MIL/uL   Hemoglobin 9.2 (L) 13.0 - 17.0 g/dL   HCT 29.3 (L) 39.0 - 52.0 %   MCV 99.0 78.0 - 100.0 fL   MCH 31.1 26.0 - 34.0 pg   MCHC 31.4 30.0 - 36.0 g/dL   RDW 17.2 (H) 11.5 - 15.5 %   Platelets 224 150 - 400 K/uL  Renal function panel     Status: Abnormal   Collection Time: 09/04/16  6:27 AM  Result Value Ref Range   Sodium 136 135 - 145 mmol/L   Potassium 3.8 3.5 - 5.1 mmol/L   Chloride 96 (L) 101 - 111 mmol/L   CO2 31 22 - 32 mmol/L   Glucose, Bld 168 (H) 65 - 99 mg/dL   BUN 27 (H) 6 - 20 mg/dL   Creatinine, Ser 1.31 (H) 0.61 - 1.24 mg/dL   Calcium 8.5 (L) 8.9 - 10.3 mg/dL   Phosphorus 3.6 2.5 - 4.6 mg/dL   Albumin 2.1 (L) 3.5 - 5.0 g/dL   GFR calc non Af Amer 55 (L) >60 mL/min   GFR calc Af Amer >60 >60 mL/min    Comment: (NOTE) The eGFR has been calculated using the CKD EPI equation. This calculation has not been validated in all clinical situations. eGFR's persistently <60 mL/min signify possible Chronic Kidney Disease.    Anion gap 9 5 - 15  Glucose, capillary     Status:  Abnormal   Collection Time: 09/04/16  7:23 AM  Result Value Ref Range   Glucose-Capillary 162 (H) 65 - 99 mg/dL   Comment 1 Notify RN    Comment 2 Document in Chart   Glucose, capillary     Status: Abnormal   Collection Time: 09/04/16 11:38 AM  Result Value Ref Range   Glucose-Capillary 158 (H) 65 - 99 mg/dL   Comment 1 Notify RN    Comment 2 Document in Chart   Glucose, capillary     Status: Abnormal   Collection Time: 09/04/16  4:47 PM  Result Value Ref Range   Glucose-Capillary 165 (H) 65 - 99 mg/dL   Comment 1 Notify RN   Glucose, capillary  Status: Abnormal   Collection Time: 09/04/16  6:17 PM  Result Value Ref Range   Glucose-Capillary 179 (H) 65 - 99 mg/dL  Glucose, capillary     Status: Abnormal   Collection Time: 09/04/16  8:54 PM  Result Value Ref Range   Glucose-Capillary 220 (H) 65 - 99 mg/dL   Comment 1 Notify RN   Basic metabolic panel     Status: Abnormal   Collection Time: 09/05/16  5:58 AM  Result Value Ref Range   Sodium 133 (L) 135 - 145 mmol/L   Potassium 3.8 3.5 - 5.1 mmol/L   Chloride 95 (L) 101 - 111 mmol/L   CO2 30 22 - 32 mmol/L   Glucose, Bld 166 (H) 65 - 99 mg/dL   BUN 35 (H) 6 - 20 mg/dL   Creatinine, Ser 1.52 (H) 0.61 - 1.24 mg/dL   Calcium 8.1 (L) 8.9 - 10.3 mg/dL   GFR calc non Af Amer 46 (L) >60 mL/min   GFR calc Af Amer 53 (L) >60 mL/min    Comment: (NOTE) The eGFR has been calculated using the CKD EPI equation. This calculation has not been validated in all clinical situations. eGFR's persistently <60 mL/min signify possible Chronic Kidney Disease.    Anion gap 8 5 - 15  Renal function panel     Status: Abnormal   Collection Time: 09/05/16  5:58 AM  Result Value Ref Range   Sodium 133 (L) 135 - 145 mmol/L   Potassium 3.9 3.5 - 5.1 mmol/L   Chloride 94 (L) 101 - 111 mmol/L   CO2 29 22 - 32 mmol/L   Glucose, Bld 163 (H) 65 - 99 mg/dL   BUN 35 (H) 6 - 20 mg/dL   Creatinine, Ser 1.62 (H) 0.61 - 1.24 mg/dL   Calcium 8.2 (L) 8.9 -  10.3 mg/dL   Phosphorus 3.9 2.5 - 4.6 mg/dL   Albumin 1.9 (L) 3.5 - 5.0 g/dL   GFR calc non Af Amer 42 (L) >60 mL/min   GFR calc Af Amer 49 (L) >60 mL/min    Comment: (NOTE) The eGFR has been calculated using the CKD EPI equation. This calculation has not been validated in all clinical situations. eGFR's persistently <60 mL/min signify possible Chronic Kidney Disease.    Anion gap 10 5 - 15  CBC     Status: Abnormal   Collection Time: 09/05/16  5:58 AM  Result Value Ref Range   WBC 8.2 4.0 - 10.5 K/uL   RBC 2.95 (L) 4.22 - 5.81 MIL/uL   Hemoglobin 9.2 (L) 13.0 - 17.0 g/dL   HCT 28.9 (L) 39.0 - 52.0 %   MCV 98.0 78.0 - 100.0 fL   MCH 31.2 26.0 - 34.0 pg   MCHC 31.8 30.0 - 36.0 g/dL   RDW 17.0 (H) 11.5 - 15.5 %   Platelets 216 150 - 400 K/uL  Glucose, capillary     Status: Abnormal   Collection Time: 09/05/16  7:17 AM  Result Value Ref Range   Glucose-Capillary 156 (H) 65 - 99 mg/dL  Glucose, capillary     Status: Abnormal   Collection Time: 09/05/16 11:08 AM  Result Value Ref Range   Glucose-Capillary 158 (H) 65 - 99 mg/dL    Studies/Results:     Vikas Wegmann A. Merlene Laughter, M.D.  Diplomate, Tax adviser of Psychiatry and Neurology ( Neurology). 09/05/2016, 4:27 PM

## 2016-09-06 DIAGNOSIS — R569 Unspecified convulsions: Secondary | ICD-10-CM | POA: Diagnosis not present

## 2016-09-06 DIAGNOSIS — G9340 Encephalopathy, unspecified: Secondary | ICD-10-CM | POA: Diagnosis not present

## 2016-09-06 LAB — GLUCOSE, CAPILLARY
GLUCOSE-CAPILLARY: 116 mg/dL — AB (ref 65–99)
GLUCOSE-CAPILLARY: 154 mg/dL — AB (ref 65–99)
GLUCOSE-CAPILLARY: 190 mg/dL — AB (ref 65–99)
Glucose-Capillary: 203 mg/dL — ABNORMAL HIGH (ref 65–99)

## 2016-09-06 LAB — RENAL FUNCTION PANEL
ANION GAP: 5 (ref 5–15)
Albumin: 1.9 g/dL — ABNORMAL LOW (ref 3.5–5.0)
BUN: 39 mg/dL — ABNORMAL HIGH (ref 6–20)
CHLORIDE: 96 mmol/L — AB (ref 101–111)
CO2: 32 mmol/L (ref 22–32)
Calcium: 8 mg/dL — ABNORMAL LOW (ref 8.9–10.3)
Creatinine, Ser: 1.66 mg/dL — ABNORMAL HIGH (ref 0.61–1.24)
GFR calc non Af Amer: 41 mL/min — ABNORMAL LOW (ref >60)
GFR, EST AFRICAN AMERICAN: 48 mL/min — AB (ref >60)
GLUCOSE: 170 mg/dL — AB (ref 65–99)
Phosphorus: 4 mg/dL (ref 2.5–4.6)
Potassium: 3.8 mmol/L (ref 3.5–5.1)
SODIUM: 133 mmol/L — AB (ref 135–145)

## 2016-09-06 MED ORDER — LIDOCAINE HCL (PF) 1 % IJ SOLN
INTRAMUSCULAR | Status: AC
  Filled 2016-09-06: qty 15

## 2016-09-06 MED ORDER — LIDOCAINE HCL (PF) 1 % IJ SOLN: 15.0000 mL | Freq: Once | INTRAMUSCULAR | Status: DC

## 2016-09-06 MED ORDER — IPRATROPIUM-ALBUTEROL 0.5-2.5 (3) MG/3ML IN SOLN
3.0000 mL | RESPIRATORY_TRACT | Status: DC | PRN
  Administered 2016-09-06 – 2016-09-07 (×3): 3 mL via RESPIRATORY_TRACT
  Filled 2016-09-06 (×2): qty 3

## 2016-09-06 MED ORDER — ZOLPIDEM TARTRATE 5 MG PO TABS
5.0000 mg | ORAL_TABLET | Freq: Every evening | ORAL | Status: DC | PRN
  Administered 2016-09-06 – 2016-09-08 (×3): 5 mg via ORAL
  Filled 2016-09-06 (×3): qty 1

## 2016-09-06 NOTE — Evaluation (Signed)
Physical Therapy Evaluation Patient Details Name: [name] MRN: [id number] DOB: [birth year] Today's Date: 09/06/2016   History of Present Illness  65 y.o. male with medical history significant for insulin-dependent diabetes mellitus, rheumatoid arthritis, chronic systolic CHF, and chronic kidney disease stage III who presents from his SNF for evaluation of respiratory distress with productive cough. Patient had just been admitted for management of acute renal failure and volume overload, was dialyzed and diuresed, losing 10 pounds during the admission and with a net negative ~4.6 L. He was discharged to the SNF yesterday in much improved and stable condition, but since that time his respiratory status has re-worsened and his cough has increased and changed, now productive of thick discolored sputum. He reports worsening dyspnea at rest.  Dx: HCAP, acute on chronic diastolic CHF, acute encephalopathy after possible partial seizure on 9/10.  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  Pt has been at the [facility] receiving PT there, and states that they were using a hoyer lift to transfer him into the chair.  Just recently he had started working on sitting balance on the EOB.  Today, pt was able to perform static sitting balance on the EOB and required varied assistance from Mod A to close supervision as he was able to find his center of gravity.  Pt tolerated sitting on the EOB for ~10 min.  He requires Max A for supine<>sit and sit<>supine as well as rolling both directions in the bed due to co morbidities of RA and inability to  use UE's to assist at times.  He would benefit from continued PT at SNF to improve his balance and transfers into a w/c.       Follow Up Recommendations SNF    Equipment Recommendations  None recommended by PT    Recommendations for Other Services OT consult     Precautions / Restrictions Precautions Precautions: Fall Precaution Comments: due to immobility Restrictions Weight Bearing Restrictions: Yes LLE Weight Bearing: Non weight bearing Other Position/Activity Restrictions: L BKA in 04/2016      Mobility  Bed Mobility Overal bed mobility: Needs Assistance Bed Mobility: Supine to Sit;Sit to Supine     Supine to sit: Max assist Sit to supine: Max assist   General bed mobility comments: Max A +2 for supine scoot to the [facility] for positioning.  Pt was able to sit on the EOB for 10 min while receiving a breathing tx.    Transfers                    Ambulation/Gait                Stairs            Wheelchair Mobility    Modified Rankin (Stroke Patients Only)       Balance Overall balance assessment: Needs assistance Sitting-balance support: Bilateral upper extremity supported Sitting balance-Leahy Scale: Poor Sitting balance - Comments: Pt initially demonstrates strong posterior lean, and requires Mod A for stabilization, however once his hips were scooted further to the EOB, and R foot was on the floor, he was able to  find his COG and only required close supervision for static sitting balance.  Pt required cues for head up and shoulders back positioning.  He is very kyphotic.   Postural control: Posterior lean                                   Pertinent Vitals/Pain Pain Assessment: No/denies pain    Home Living   Living Arrangements: Spouse/significant other   Type of Home: House Home Access: Stairs to enter   [employer] of Steps: 1 Home Layout: One level Home Equipment: Cane - single point      Prior  Function Level of Independence: Needs assistance   Gait / Transfers Assistance Needed: Pt and wife report that he has been using a hoyer lift for transfers at the [facility].  They had just started working on sitting balance on the EOB with PT, and he had been able to try a sliding board t/f once with one of the PT's.  He is not able to propel himself in the w/c due to significant RA hand deformities.    ADL's / Homemaking Assistance Needed: Pt requires assistance for dressing, bathing, toileting.    Comments: Pt reports that up until June of this year he was driving and modified independent with all activities.       Hand Dominance        Extremity/Trunk Assessment   Upper Extremity Assessment:  (B UE RA hand deformities)           Lower Extremity Assessment: Generalized weakness;LLE deficits/detail   LLE Deficits / Details: BKA with bandage residual limb.   Cervical / Trunk Assessment: Kyphotic  Communication   Communication: No difficulties  Cognition Arousal/Alertness: Awake/alert Behavior During Therapy: WFL for tasks assessed/performed Overall Cognitive Status: Within Functional Limits for tasks assessed                      General Comments      Exercises        Assessment/Plan    PT Assessment Patient needs continued PT services  PT Diagnosis Generalized weakness   PT Problem List Decreased strength;Decreased activity tolerance;Decreased balance;Decreased mobility;Decreased skin integrity;Cardiopulmonary status limiting activity;Decreased safety awareness;Decreased knowledge of use of DME;Decreased knowledge of precautions;Pain  PT Treatment Interventions DME instruction;Functional mobility training;Therapeutic activities;Therapeutic exercise;Balance training;Patient/family education;Wheelchair mobility training   PT Goals (Current goals can be found in the Care Plan section) Acute Rehab PT Goals Patient Stated Goal: Pt wants to get strong at rehab  so he can go home.  PT Goal Formulation: With patient/family Time For Goal Achievement: 09/20/16 Potential to Achieve Goals: Fair    Frequency Min 4X/week   Barriers to discharge Inaccessible home environment      Co-evaluation               End of Session Equipment Utilized During Treatment: Oxygen Activity Tolerance: Patient tolerated treatment well Patient left: in bed Nurse Communication: Need for lift equipment;Mobility status (Maxi Move for transfers. )    Functional Assessment Tool Used: [place] "6-clicks"  Functional Limitation: Mobility: Walking and moving around Mobility: Walking and Moving Around Current Status [phone]): At least 80 percent but less than 100 percent impaired, limited or restricted Mobility: Walking and Moving Around Goal Status [phone]): At least 60 percent but less than 80 percent impaired, limited or restricted    Time: 1530-1605 PT Time Calculation (  min) (ACUTE ONLY): 35 min   Charges:   PT Evaluation $PT Eval Moderate Complexity: 1 Procedure PT Treatments $Therapeutic Activity: 8-22 mins   PT G Codes:   PT G-Codes **NOT FOR INPATIENT CLASS** Functional Assessment Tool Used: [employer] "6-clicks"  Functional Limitation: Mobility: Walking and moving around Mobility: Walking and Moving Around Current Status [phone]): At least 80 percent but less than 100 percent impaired, limited or restricted Mobility: Walking and Moving Around Goal Status [phone]): At least 60 percent but less than 80 percent impaired, limited or restricted   Beth Okla Qazi, PT, DPT X: [phone]

## 2016-09-06 NOTE — Consult Note (Signed)
SURGICAL CONSULTATION AND PROCEDURE NOTE (initial) - cpt: [id number], [phone]  HISTORY OF PRESENT ILLNESS (HPI):  65 y.o. male on chronic hemodialysis via a tunneled Left IJ hemodialysis catheter inserted at [facility] 05/2016 for AKI superimposed on CKD attributed to diabetes mellitus and HTN presented to [facility] ED approximately 1 week ago for cough and difficulty breathing. At that time, it seems his last hemodialysis was 9/1. Patient was feeling better when he was dialyzed 9/9 and continues to produce adequate urine. Nephrologist Dr. Lowanda Foster believes patient no longer requires dialysis or hemodialysis access at this time.  Surgery is consulted by nephrologist Dr. Lowanda Foster in this context for evaluation and management of Left IJ hemodialysis catheter.  PAST MEDICAL HISTORY (PMH):  Past Medical History:  Diagnosis Date  . Chronic back pain   . Chronic neck pain   . Coronary artery disease 1993   angioplasty after AMI  . Diabetes mellitus   . Diabetic neuropathy ([place])   . Hyperkalemia   . Hypertension   . Pancytopenia ([place]) 08/13/2014  . 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  . Rheumatoid arthritis(714.0)   . Seizure ([place]) 08/13/2014   Pt denies     PAST SURGICAL HISTORY [facility]):  Past Surgical History:  Procedure Laterality Date  . CARDIAC SURGERY    . FRACTURE SURGERY    . JOINT REPLACEMENT       MEDICATIONS:  Prior to Admission medications   Medication Sig Start Date End Date Taking? Authorizing Provider  acetaminophen (TYLENOL) 325 MG tablet Take 650 mg by mouth every 6 (six) hours as needed for mild pain or moderate pain.    Yes Historical Provider, MD  albuterol (PROVENTIL) (2.5 MG/3ML) 0.083% nebulizer solution Take 2.5 mg by nebulization every 4 (four) hours as needed for wheezing or shortness of breath.   Yes Historical Provider, MD  Amino Acids-Protein Hydrolys (PRO-STAT 64 PO) Take 30 mLs by mouth 2 (two) times daily.   Yes  Historical Provider, MD  amiodarone (PACERONE) 200 MG tablet Take 200 mg by mouth daily.   Yes Historical Provider, MD  Artificial Tear Ointment (REFRESH P.M. OP) Apply 1 drop to eye daily as needed (FOR DY EYES). ONE APPLICATION BOTH EYES. For dry eyes not relieved by theratears every 6 hours    Yes Historical Provider, MD  aspirin 81 MG chewable tablet Chew 81 mg by mouth every morning.    Yes Historical Provider, MD  atorvastatin (LIPITOR) 80 MG tablet Take 80 mg by mouth daily.   Yes Historical Provider, MD  Carboxymethylcellulose Sodium (THERATEARS) 0.25 % SOLN Apply 2 drops to both eyes three times a day   Yes Historical Provider, MD  docusate sodium (COLACE) 100 MG capsule Take 100 mg by mouth 2 (two) times daily.   Yes Historical Provider, MD  hydroxychloroquine (PLAQUENIL) 200 MG tablet Take 200 mg by mouth daily.    Yes Historical Provider, MD  insulin NPH Human (HUMULIN N) 100 UNIT/ML injection Give 5 units subcutaneous twice a day   Yes Historical Provider, MD  Melatonin 3 MG TABS Take 2 tablets by mouth at bedtime   Yes Historical Provider, MD  metoprolol succinate (TOPROL-XL) 25 MG 24 hr tablet Take 25 mg by mouth every morning.   Yes Historical Provider, MD  Multiple Vitamins-Minerals (THERA-M ENHANCED PO) 1 tablet by mouth daily   Yes Historical Provider, MD  nystatin cream (MYCOSTATIN) Apply 1 application topically daily. Mix with zinc oxide and apply to  sacrum daily & as needed   Yes Historical Provider, MD  olopatadine (PATANOL) 0.1 % ophthalmic solution Place 1 drop into both eyes 2 (two) times daily.   Yes Historical Provider, MD  omega-3 acid ethyl esters (LOVAZA) 1 g capsule Take 2 g by mouth 2 (two) times daily.   Yes Historical Provider, MD  oxyCODONE (OXY IR/ROXICODONE) 5 MG immediate release tablet Take 1 tablet (5 mg total) by mouth every 12 (twelve) hours as needed for severe pain. 08/31/16  Yes Gildardo Cranker, DO  OXYGEN Inhale 2 L into the lungs continuous. O 2 at 2 LPM  via n/c to keep O2 sat above 90 % every shift    Yes Historical Provider, MD  polyethylene glycol powder (MIRALAX) powder Take 17 g by mouth 2 (two) times daily. 08/14/14  Yes Rexene Alberts, MD  predniSONE (DELTASONE) 2.5 MG tablet Take 3 tablets (7.5 mg total) by mouth daily with breakfast. 08/28/16  Yes Orvan Falconer, MD  pregabalin (LYRICA) 25 MG capsule Take one capsule by mouth at bedtime Patient taking differently: Take 25 mg by mouth at bedtime. Take one capsule by mouth at bedtime 08/31/16  Yes Gildardo Cranker, DO  Tamsulosin HCl (FLOMAX) 0.4 MG CAPS Take 0.4 mg by mouth at bedtime.    Yes Historical Provider, MD  thiamine (VITAMIN B-1) 100 MG tablet Take 100 mg by mouth daily.   Yes Historical Provider, MD  traZODone (DESYREL) 50 MG tablet Take 50 mg by mouth at bedtime.   Yes Historical Provider, MD  vitamin C (ASCORBIC ACID) 500 MG tablet Take 500 mg by mouth daily.   Yes Historical Provider, MD  Zinc Oxide 10 % OINT Mix with Nystatin and apply to sacrum daily & prn as ordered once a day   Yes Historical Provider, MD  zinc sulfate 220 (50 Zn) MG capsule Take 220 mg by mouth daily.   Yes Historical Provider, MD     ALLERGIES:  Allergies  Allergen Reactions  . Sulfa Antibiotics Rash     SOCIAL HISTORY:  Social History   Social History  . Marital status: Married    Spouse name: N/A  . Number of children: N/A  . Years of education: N/A   Occupational History  . Not on file.   Social History Main Topics  . Smoking status: Never Smoker  . Smokeless tobacco: Never Used  . Alcohol use No  . Drug use: No  . Sexual activity: Not on file   Other Topics Concern  . Not on file   Social History Narrative  . No narrative on file    The patient currently resides (home / rehab facility / nursing home): Nursing home  The patient normally is (ambulatory / bedbound): Ambulatory   FAMILY HISTORY:  Family History  Problem Relation Age of Onset  . Diabetes Father   . Dementia Father   .  Heart attack Brother     multiple brothers with MIs  . Hypertension Mother   . Stroke Mother      REVIEW OF SYSTEMS:  Constitutional: denies weight loss, fever, chills, or sweats  Eyes: denies any other vision changes, history of eye injury  ENT: denies sore throat, hearing problems  Respiratory: denies shortness of breath, wheezing  Cardiovascular: Well-healed Left chest and Left neck incisions with well-secured hemodialysis catheter without erythema or drainage; denies chest pain, palpitations  Gastrointestinal: denies abdominal pain, N/V, or diarrhea  Musculoskeletal: denies any other joint pains or cramps  Skin: denies any  other rashes or skin discolorations  Neurological: denies any other headache, dizziness, weakness  Psychiatric: denies any other depression, anxiety   All other review of systems were negative   VITAL SIGNS:  Temp:  [98.1 F (36.7 C)-99 F (37.2 C)] 98.1 F (36.7 C) (09/12 0656) Pulse Rate:  [84-90] 90 (09/12 1115) Resp:  [17-19] 17 (09/12 0656) BP: (124-125)/(51-69) 125/69 (09/12 1115) SpO2:  [98 %-100 %] 100 % (09/12 0728) Weight:  [85.1 kg (187 lb 9.8 oz)] 85.1 kg (187 lb 9.8 oz) (09/12 0656)     Height: 5\' 9"  (175.3 cm) Weight: 85.1 kg (187 lb 9.8 oz) BMI (Calculated): 27.4   INTAKE/OUTPUT:  This shift: Total I/O In: 680 [P.O.:480; IV Piggyback:200] Out: -   Last 2 shifts: @IOLAST2SHIFTS @   PHYSICAL EXAM:  Constitutional:  -- Normal body habitus  -- Awake, alert, and oriented x3  Eyes:  -- Pupils equally round and reactive to light  -- No scleral icterus  Ear, nose, and throat:  -- No jugular venous distension  Pulmonary:  -- No crackles  -- Equal breath sounds bilaterally -- Breathing non-labored at rest Cardiovascular:  -- S1, S2 present  -- No pericardial rubs Gastrointestinal:  -- Abdomen soft, nontender, nondistended, no guarding/rebound  -- No abdominal masses appreciated, pulsatile or otherwise  Musculoskeletal / Integumentary:   -- Wounds or skin discoloration: None appreciated -- Extremities: B/L UE and LE FROM, hands and feet warm, no edema  Neurologic:  -- Motor function: intact and symmetric -- Sensation: intact and symmetric  Labs:  CBC:  Lab Results  Component Value Date   WBC 8.2 09/05/2016   RBC 2.95 (L) 09/05/2016   BMP:  Lab Results  Component Value Date   GLUCOSE 102 (H) 09/07/2016   CO2 33 (H) 09/07/2016   BUN 44 (H) 09/07/2016   CREATININE 1.87 (H) 09/07/2016   CALCIUM 8.3 (L) 09/07/2016    Assessment/Plan: (ICD-10's: N18.9 65 y.o. male on chronic hemodialysis via a tunneled Left IJ hemodialysis catheter inserted at [facility] 05/2016 for AKI superimposed on CKD attributed to diabetes mellitus and HTN with improved renal function, currently no longer requiring hemodialysis access per nephrology, complicated by pertinent comorbidities including diabetes mellitus, HTN, CAD s/p coronary angioplasty for MI, CKD, and rheumatoid arthritis.   - intact tunneled Left IJ hemodialysis catheter was removed bedside after local anesthetic was injected subcutaneously, catheter's well-incorporated cuff was bluntly and sharply dissected free from surrounding tissues, direct pressure was applied for hemostasis, and a sterile gauze and adhesive dressing was applied  - if patient is anticipated to require hemodialysis within 6 months or otherwise as per nephrology protocol, recommend upper extremity vein mapping and outpatient follow-up for creation of AV fistula for durable autogenous hemodialysis access  - medical management of comorbidities as per medical team  - please call if any questions  - DVT prophylaxis  All of the above findings and recommendations were discussed with the patient and his wife, and all of his and his wife's questions were answered to their expressed satisfaction.  Thank you for the opportunity to participate in this patient's care.   -- Marilynne Drivers Rosana Hoes, MD, [place]:  [place] [facility] Office: [phone]

## 2016-09-06 NOTE — Progress Notes (Signed)
Subjective: Interval History: Patient is feeling much better. He does have any difficulty breathing. He has some cough but no sputum production.  Objective: Vital signs in last 24 hours: Temp:  [97.6 F (36.4 C)-99 F (37.2 C)] 98.1 F (36.7 C) (09/12 0656) Pulse Rate:  [84-136] 84 (09/12 0656) Resp:  [17-19] 17 (09/12 0656) BP: (122-156)/(40-122) 124/51 (09/12 0656) SpO2:  [92 %-100 %] 100 % (09/12 0728) Weight:  [85.1 kg (187 lb 9.8 oz)] 85.1 kg (187 lb 9.8 oz) (09/12 0656) Weight change: -3.6 kg (-7 lb 15 oz)  Intake/Output from previous day: 09/11 0701 - 09/12 0700 In: 770 [P.O.:720; IV Piggyback:50] Out: 3500 [Urine:1625] Intake/Output this shift: No intake/output data recorded.  Generally patient is alert and calmer  today. His Less agitated Chest: Decreased breath sound bilaterally but seems to be clear Heart exam regular rate and rhythm Extremities he has 1+ edema on the right and left BKA Lab Results:  Recent Labs  09/04/16 0627 09/05/16 0558  WBC 8.5 8.2  HGB 9.2* 9.2*  HCT 29.3* 28.9*  PLT 224 216   BMET:   Recent Labs  09/05/16 0558 09/06/16 0623  NA 133*  133* 133*  K 3.9  3.8 3.8  CL 94*  95* 96*  CO2 29  30 32  GLUCOSE 163*  166* 170*  BUN 35*  35* 39*  CREATININE 1.62*  1.52* 1.66*  CALCIUM 8.2*  8.1* 8.0*   No results for input([name]): PTH in the last 72 hours. Iron Studies: No results for input([name]): IRON, TIBC, TRANSFERRIN, FERRITIN in the last 72 hours.  Studies/Results: No results found.  I have reviewed the patient'[name] current medications.  Assessment/Plan: Problem #1 difficulty in breathing possibly multifactorial. Presently seems to be feeling better. Patient is on Demadex and metolazone he has 1600 mL of urine output. Problem #2 hyperkalemia: His potassium has corrected Problem #3 chronic renal failure presently he doesn't have any nausea or vomiting. His creatinine is 1.66 stable. Problem #4 altered mental status: Seems to be  improving. Most likely from medications. Problem #5 history of diabetes: His blood sugar is reasonably controlled. Problem #6 anemia: His hemoglobin is slightly below target goa. He is on Epogen Problem #7 metabolic bone disease: His calcium and phosphorus is range. Patient is not on a binder. Plan: 1) patient had a function seems to be stable and making good urine output. Hence he may not require dialysis. 2] I will surgery to remove his dialysis catheter. 3] will continue with Demadex 60 mg by mouth once a day 4] will check his renal panel in the morning.     LOS: 3 days   [name] 09/06/2016,8:17 AM

## 2016-09-06 NOTE — Progress Notes (Signed)
PROGRESS NOTE    [name]  FMB:[id number] DOB: 1951/09/23 DOA: 09/02/2016 PCP: Lala Lund, MD     Brief Narrative:  65 y/o man admitted on 9/8 from [facility] with SOB. Found to have pulmonary edema as well as possible HCAP.    Assessment & Plan:   Principal Problem:   HCAP (healthcare-associated pneumonia) Active Problems:   Rheumatoid arthritis ([place])   Hypertension   Insulin dependent diabetes mellitus ([place])   Seizure ([place])   Anemia of chronic disease   Acute on chronic diastolic CHF (congestive heart failure) (HCC)   Acute renal failure superimposed on stage 3 chronic kidney disease ([facility])   Hyperkalemia   Sacral decubitus ulcer   Acute Encephalopathy -Likely due to medications and multiple metabolic abnormalities. -Has resolved and is back to baseline per wife at bedside. -Had an episode 9/10 lasting 5 minutes of eyes rolled back and difficulty speaking followed by 10 minutes were he had difficulty recognizing his family members. ?partial seizure. EEG and neurology consult requested  HCAP -Continue vanc/cefepime for now. Can consider changing to PO abx in 1-2 days if continues to improve. -Cx data pending. -Has copious sputum production.  Dysphagia -ST recommends D3 diet with thin liquids.  Acute on CKD Stage III -Has been dialyzed on and off acutely. -Had HD 9/9. -HD currently on hold.  Hyperkalemia -Resolved with HD.  Acute on Chronic Diastolic CHF -ECHO: EF 70-17% with grade 2 diastolic dysfunction. -Continue diuretics (currently on demadex amd metolazone) at nephrology's recommendations.  IDDM -Improved with increased lantus dose. -Check A1C.  RA -Continue prednisone and plaquenil. -Received high dose solumedrol in ED.    DVT prophylaxis: SQ heparin Code Status: full code Family Communication: wife at bedside updated on plan of care Disposition Plan: back to SNF once ready for DC likely in 24-48 hours. Awaiting neurology input re  potential seizures and ?need for treatment.  Consultants:   Nephrology  Procedures:   None  Antimicrobials:   Vancomycin 9/8>>  Cefepime 9/8>>    Subjective: Awake, conversant, copious sputum production, pleasant.  Objective: Vitals:   09/06/16 0015 09/06/16 0656 09/06/16 0728 09/06/16 1115  BP:  (!) 124/51  125/69  Pulse:  84  90  Resp:  17    Temp:  98.1 F (36.7 C)    TempSrc:  Axillary    SpO2: 98% 100% 100%   Weight:  85.1 kg (187 lb 9.8 oz)    Height:        Intake/Output Summary (Last 24 hours) at 09/06/16 1455 Last data filed at 09/06/16 1251  Gross per 24 hour  Intake              890 ml  Output             1200 ml  Net             -310 ml   Filed Weights   09/04/16 0557 09/05/16 0449 09/06/16 0656  Weight: 87.4 kg (192 lb 10.9 oz) 88.7 kg (195 lb 8.8 oz) 85.1 kg (187 lb 9.8 oz)    Examination:  General exam: AA Ox3 Respiratory system: Clear to auscultation. Respiratory effort normal. Cardiovascular system:RRR. No murmurs, rubs, gallops. Gastrointestinal system: Abdomen is nondistended, soft and nontender. No organomegaly or masses felt. Normal bowel sounds heard. Central nervous system: unable to assess given current mental state Extremities: 2++ edema on right, s/p left BKA, +pedal pulses     Data Reviewed: I have personally reviewed following  labs and imaging studies  CBC:  Recent Labs Lab 09/01/16 0735 09/02/16 1559 09/03/16 1500 09/04/16 0627 09/05/16 0558  WBC 8.8 10.1 9.2 8.5 8.2  NEUTROABS  --  8.6*  --   --   --   HGB 9.5* 9.8* 8.9* 9.2* 9.2*  HCT 29.9* 30.5* 27.8* 29.3* 28.9*  MCV 96.1 96.2 96.2 99.0 98.0  PLT 232 248 263 224 657   Basic Metabolic Panel:  Recent Labs Lab 09/02/16 1559  09/03/16 0501 09/03/16 0745 09/03/16 1212 09/04/16 0627 09/05/16 0558 09/06/16 0623  NA 129*  --  130*  --   --  136  135 133*  133* 133*  K 5.8*  < > 5.7*  5.7* 5.7* 5.4* 3.8  3.8 3.9  3.8 3.8  CL 91*  --  93*  --   --   96*  96* 94*  95* 96*  CO2 29  --  28  --   --  31  31 29  30  32  GLUCOSE 227*  --  297*  --   --  168*  168* 163*  166* 170*  BUN 48*  --  53*  --   --  27*  28* 35*  35* 39*  CREATININE 2.11*  --  2.06*  --   --  1.31*  1.26* 1.62*  1.52* 1.66*  CALCIUM 8.4*  --  8.6*  --   --  8.5*  8.4* 8.2*  8.1* 8.0*  PHOS  --   --   --   --   --  3.6 3.9 4.0  < > = values in this interval not displayed. GFR: Estimated Creatinine Clearance: 46.7 mL/min (by C-G formula based on SCr of 1.66 mg/dL). Liver Function Tests:  Recent Labs Lab 09/04/16 0627 09/05/16 0558 09/06/16 0623  ALBUMIN 2.1* 1.9* 1.9*   No results for input(s): LIPASE, AMYLASE in the last 168 hours. No results for input(s): AMMONIA in the last 168 hours. Coagulation Profile: No results for input(s): INR, PROTIME in the last 168 hours. Cardiac Enzymes: No results for input(s): CKTOTAL, CKMB, CKMBINDEX, TROPONINI in the last 168 hours. BNP (last 3 results) No results for input(s): PROBNP in the last 8760 hours. HbA1C: No results for input(s): HGBA1C in the last 72 hours. CBG:  Recent Labs Lab 09/05/16 1108 09/05/16 1633 09/05/16 2034 09/06/16 0740 09/06/16 1114  GLUCAP 158* 165* 219* 203* 116*   Lipid Profile: No results for input(s): CHOL, HDL, LDLCALC, TRIG, CHOLHDL, LDLDIRECT in the last 72 hours. Thyroid Function Tests: No results for input(s): TSH, T4TOTAL, FREET4, T3FREE, THYROIDAB in the last 72 hours. Anemia Panel: No results for input(s): VITAMINB12, FOLATE, FERRITIN, TIBC, IRON, RETICCTPCT in the last 72 hours. Urine analysis:    Component Value Date/Time   COLORURINE AMBER (A) 08/26/2016 0940   APPEARANCEUR CLEAR 08/26/2016 0940   LABSPEC 1.020 08/26/2016 0940   PHURINE 6.0 08/26/2016 0940   GLUCOSEU NEGATIVE 08/26/2016 0940   HGBUR TRACE (A) 08/26/2016 0940   BILIRUBINUR NEGATIVE 08/26/2016 0940   KETONESUR NEGATIVE 08/26/2016 0940   PROTEINUR 30 (A) 08/26/2016 0940   UROBILINOGEN  0.2 08/12/2014 2358   NITRITE NEGATIVE 08/26/2016 0940   LEUKOCYTESUR TRACE (A) 08/26/2016 0940   Sepsis Labs: @LABRCNTIP (procalcitonin:4,lacticidven:4)  ) Recent Results (from the past 240 hour(s))  Culture, respiratory (NON-Expectorated)     Status: None (Preliminary result)   Collection Time: 09/03/16  7:50 PM  Result Value Ref Range Status   Specimen Description SPUTUM  Final  Special Requests NONE  Final   Gram Stain   Final    ABUNDANT WBC PRESENT,BOTH PMN AND MONONUCLEAR ABUNDANT GRAM POSITIVE COCCI IN PAIRS MODERATE GRAM POSITIVE RODS    Culture   Final    CULTURE REINCUBATED FOR BETTER GROWTH Performed at [facility]    Report Status PENDING  Incomplete         Radiology Studies: No results found.      Scheduled Meds: . amiodarone  200 mg Oral Daily  . aspirin  81 mg Oral q morning - 10a  . atorvastatin  80 mg Oral q1800  . ceFEPime (MAXIPIME) IV  2 g Intravenous Q24H  . chlorhexidine  15 mL Mouth Rinse BID  . dextrose  25 mL Intravenous Once  . docusate sodium  100 mg Oral BID  . epoetin (EPOGEN/PROCRIT) injection  10,000 Units Intravenous Q M,W,F-HD  . feeding supplement (PRO-STAT SUGAR FREE 64)  30 mL Oral BID  . heparin  5,000 Units Subcutaneous Q8H  . hydroxychloroquine  200 mg Oral Daily  . insulin aspart  0-9 Units Subcutaneous TID WC  . insulin glargine  8 Units Subcutaneous QHS  . ipratropium-albuterol  3 mL Nebulization BID  . mouth rinse  15 mL Mouth Rinse q12n4p  . metoprolol succinate  25 mg Oral q morning - 10a  . olopatadine  1 drop Both Eyes BID  . omega-3 acid ethyl esters  2 g Oral BID  . polyethylene glycol  17 g Oral BID  . polyvinyl alcohol  2 drop Both Eyes TID  . predniSONE  7.5 mg Oral Q breakfast  . pregabalin  25 mg Oral QHS  . sodium chloride flush  3 mL Intravenous Q12H  . tamsulosin  0.4 mg Oral QHS  . thiamine  100 mg Oral Daily  . torsemide  60 mg Oral Daily  . vancomycin  1,000 mg Intravenous Q24H  .  vitamin C  500 mg Oral Daily  . zinc sulfate  220 mg Oral Daily   Continuous Infusions:    LOS: 3 days    Time spent: 25 minutes. Greater than 50% of this time was spent in direct contact with the patient coordinating care.     Lelon Frohlich, MD [facility] Pager [phone]  If 7PM-7AM, please contact night-coverage [email or web address] Password [facility] 09/06/2016, 2:55 PM

## 2016-09-06 NOTE — Progress Notes (Signed)
Inpatient Diabetes Program Recommendations  AACE/ADA: New Consensus Statement on Inpatient Glycemic Control (2015)  Target Ranges:  Prepandial:   less than 140 mg/dL      Peak postprandial:   less than 180 mg/dL (1-2 hours)      Critically ill patients:  140 - 180 mg/dL   Lab Results  Component Value Date   GLUCAP 203 (H) 09/06/2016   HGBA1C 6.9 (H) 12/11/2012    Review of Glycemic Control  Results for [name], [name] (MRN [id number]) as of 09/06/2016 11:14  Ref. Range 09/05/2016 07:17 09/05/2016 11:08 09/05/2016 16:33 09/05/2016 20:34 09/06/2016 07:40  Glucose-Capillary Latest Ref Range: 65 - 99 mg/dL 156 (H) 158 (H) 165 (H) 219 (H) 203 (H)    Diabetes history: Type 2 Outpatient Diabetes medications: NPH 5 units tid Current orders for Inpatient glycemic control: Novolog sensitive correction 0-9 units tid, Lantus 8 units qhs,  *prednisone qam  Inpatient Diabetes Program Recommendations:    Per ADA recommendations "consider performing an A1C on all patients with diabetes or hyperglycemia admitted to the hospital if not performed in the prior 3 months".  Consider increasing Lantus to 10 units qhs- fasting blood sugar remains elevated.   Gentry Fitz, RN, BA, MHA, CDE Diabetes Coordinator Inpatient Diabetes Program  [phone] (Team Pager) [phone] ([place]) 09/06/2016 11:16 AM

## 2016-09-06 NOTE — Progress Notes (Signed)
[place] A. [name] Laughter, MD     [email or web address]          [name] is an 65 y.o. male.   Assessment/Plan: Self-limiting spell of confusion and unresponsiveness. Etiology unclear but could be medication effect, effective of confusional arousal although sleep, effective acute medical illness and complex partial seizures. EEG has been negative and therefore we'll suggest no medication at this time. Continue to see the patient does. Given the self-limiting nature, intracranial processes are unlikely.   Doing well. No recurrent spells of confusion or unresponsiveness    GENERAL: Very pleasant man in no acute distress. He is on nasal oxygen.  HEENT: Supple. Atraumatic normocephalic.   ABDOMEN: soft  EXTREMITIES: No edema. Couple times on the right are amputated. 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   BACK: Normal.  SKIN: Normal by inspection.    MENTAL STATUS: Alert and oriented X4. Speech, language and cognition are generally intact. Judgment and insight normal.   CRANIAL NERVES: Pupils are equal, round and reactive to light and accommodation; extra ocular movements are full, there is no significant nystagmus; visual fields are full; upper and lower facial muscles are normal in strength and symmetric, there is no flattening of the nasolabial folds; tongue is midline; uvula is midline; shoulder elevation is normal.  MOTOR: Normal tone, bulk and strength in arms; no pronator drift. Leg strength is graded as 3/5.  COORDINATION: Left finger to nose is normal, right finger to nose is normal, No rest tremor; no intention tremor; no postural tremor; no bradykinesia.       Objective: Vital signs in last 24 hours: Temp:  [98.1 F (36.7 C)-99 F (37.2 C)] 98.1 F (36.7 C) (09/12 0656) Pulse Rate:  [84-90] 90 (09/12 1115) Resp:  [17-19] 17 (09/12 0656) BP: (124-125)/(51-69) 125/69 (09/12 1115) SpO2:  [98 %-100 %] 100 % (09/12  0728) Weight:  [187 lb 9.8 oz (85.1 kg)] 187 lb 9.8 oz (85.1 kg) (09/12 0656)  Intake/Output from previous day: 09/11 0701 - 09/12 0700 In: 770 [P.O.:720; IV Piggyback:50] Out: 9622 [Urine:1625] Intake/Output this shift: Total I/O In: 680 [P.O.:480; IV Piggyback:200] Out: 2000 [Urine:2000] Nutritional status: Diet heart healthy/carb modified Room service appropriate? Yes; Fluid consistency: Thin; Fluid restriction: 1500 mL Fluid   Lab Results: Results for orders placed or performed during the hospital encounter of 09/02/16 (from the past 48 hour(s))  Glucose, capillary     Status: Abnormal   Collection Time: 09/04/16  8:54 PM  Result Value Ref Range   Glucose-Capillary 220 (H) 65 - 99 mg/dL   Comment 1 Notify RN   Basic metabolic panel     Status: Abnormal   Collection Time: 09/05/16  5:58 AM  Result Value Ref Range   Sodium 133 (L) 135 - 145 mmol/L   Potassium 3.8 3.5 - 5.1 mmol/L   Chloride 95 (L) 101 - 111 mmol/L   CO2 30 22 - 32 mmol/L   Glucose, Bld 166 (H) 65 - 99 mg/dL   BUN 35 (H) 6 - 20 mg/dL   Creatinine, Ser 1.52 (H) 0.61 - 1.24 mg/dL   Calcium 8.1 (L) 8.9 - 10.3 mg/dL   GFR calc non Af Amer 46 (L) >60 mL/min   GFR calc Af Amer 53 (L) >60 mL/min    Comment: (NOTE) The eGFR has been calculated using the CKD EPI equation. This calculation has not been validated in all clinical situations. eGFR's persistently <60 mL/min signify possible Chronic Kidney Disease.  Anion gap 8 5 - 15  Renal function panel     Status: Abnormal   Collection Time: 09/05/16  5:58 AM  Result Value Ref Range   Sodium 133 (L) 135 - 145 mmol/L   Potassium 3.9 3.5 - 5.1 mmol/L   Chloride 94 (L) 101 - 111 mmol/L   CO2 29 22 - 32 mmol/L   Glucose, Bld 163 (H) 65 - 99 mg/dL   BUN 35 (H) 6 - 20 mg/dL   Creatinine, Ser 1.62 (H) 0.61 - 1.24 mg/dL   Calcium 8.2 (L) 8.9 - 10.3 mg/dL   Phosphorus 3.9 2.5 - 4.6 mg/dL   Albumin 1.9 (L) 3.5 - 5.0 g/dL   GFR calc non Af Amer 42 (L) >60 mL/min    GFR calc Af Amer 49 (L) >60 mL/min    Comment: (NOTE) The eGFR has been calculated using the CKD EPI equation. This calculation has not been validated in all clinical situations. eGFR's persistently <60 mL/min signify possible Chronic Kidney Disease.    Anion gap 10 5 - 15  CBC     Status: Abnormal   Collection Time: 09/05/16  5:58 AM  Result Value Ref Range   WBC 8.2 4.0 - 10.5 K/uL   RBC 2.95 (L) 4.22 - 5.81 MIL/uL   Hemoglobin 9.2 (L) 13.0 - 17.0 g/dL   HCT 28.9 (L) 39.0 - 52.0 %   MCV 98.0 78.0 - 100.0 fL   MCH 31.2 26.0 - 34.0 pg   MCHC 31.8 30.0 - 36.0 g/dL   RDW 17.0 (H) 11.5 - 15.5 %   Platelets 216 150 - 400 K/uL  Glucose, capillary     Status: Abnormal   Collection Time: 09/05/16  7:17 AM  Result Value Ref Range   Glucose-Capillary 156 (H) 65 - 99 mg/dL  Glucose, capillary     Status: Abnormal   Collection Time: 09/05/16 11:08 AM  Result Value Ref Range   Glucose-Capillary 158 (H) 65 - 99 mg/dL  Glucose, capillary     Status: Abnormal   Collection Time: 09/05/16  4:33 PM  Result Value Ref Range   Glucose-Capillary 165 (H) 65 - 99 mg/dL  Glucose, capillary     Status: Abnormal   Collection Time: 09/05/16  8:34 PM  Result Value Ref Range   Glucose-Capillary 219 (H) 65 - 99 mg/dL   Comment 1 Notify RN   Renal function panel     Status: Abnormal   Collection Time: 09/06/16  6:23 AM  Result Value Ref Range   Sodium 133 (L) 135 - 145 mmol/L   Potassium 3.8 3.5 - 5.1 mmol/L   Chloride 96 (L) 101 - 111 mmol/L   CO2 32 22 - 32 mmol/L   Glucose, Bld 170 (H) 65 - 99 mg/dL   BUN 39 (H) 6 - 20 mg/dL   Creatinine, Ser 1.66 (H) 0.61 - 1.24 mg/dL   Calcium 8.0 (L) 8.9 - 10.3 mg/dL   Phosphorus 4.0 2.5 - 4.6 mg/dL   Albumin 1.9 (L) 3.5 - 5.0 g/dL   GFR calc non Af Amer 41 (L) >60 mL/min   GFR calc Af Amer 48 (L) >60 mL/min    Comment: (NOTE) The eGFR has been calculated using the CKD EPI equation. This calculation has not been validated in all clinical  situations. eGFR's persistently <60 mL/min signify possible Chronic Kidney Disease.    Anion gap 5 5 - 15  Glucose, capillary     Status: Abnormal   Collection Time:  09/06/16  7:40 AM  Result Value Ref Range   Glucose-Capillary 203 (H) 65 - 99 mg/dL  Glucose, capillary     Status: Abnormal   Collection Time: 09/06/16 11:14 AM  Result Value Ref Range   Glucose-Capillary 116 (H) 65 - 99 mg/dL  Glucose, capillary     Status: Abnormal   Collection Time: 09/06/16  5:04 PM  Result Value Ref Range   Glucose-Capillary 154 (H) 65 - 99 mg/dL    Lipid Panel No results for input(s): CHOL, TRIG, HDL, CHOLHDL, VLDL, LDLCALC in the last 72 hours.  Studies/Results:   Medications:  Scheduled Meds: . lidocaine (PF)      . amiodarone  200 mg Oral Daily  . aspirin  81 mg Oral q morning - 10a  . atorvastatin  80 mg Oral q1800  . ceFEPime (MAXIPIME) IV  2 g Intravenous Q24H  . chlorhexidine  15 mL Mouth Rinse BID  . dextrose  25 mL Intravenous Once  . docusate sodium  100 mg Oral BID  . epoetin (EPOGEN/PROCRIT) injection  10,000 Units Intravenous Q M,W,F-HD  . feeding supplement (PRO-STAT SUGAR FREE 64)  30 mL Oral BID  . heparin  5,000 Units Subcutaneous Q8H  . hydroxychloroquine  200 mg Oral Daily  . insulin aspart  0-9 Units Subcutaneous TID WC  . insulin glargine  8 Units Subcutaneous QHS  . ipratropium-albuterol  3 mL Nebulization BID  . lidocaine (PF)  15 mL Intradermal Once  . mouth rinse  15 mL Mouth Rinse q12n4p  . metoprolol succinate  25 mg Oral q morning - 10a  . olopatadine  1 drop Both Eyes BID  . omega-3 acid ethyl esters  2 g Oral BID  . polyethylene glycol  17 g Oral BID  . polyvinyl alcohol  2 drop Both Eyes TID  . predniSONE  7.5 mg Oral Q breakfast  . pregabalin  25 mg Oral QHS  . sodium chloride flush  3 mL Intravenous Q12H  . tamsulosin  0.4 mg Oral QHS  . thiamine  100 mg Oral Daily  . torsemide  60 mg Oral Daily  . vancomycin  1,000 mg Intravenous Q24H  .  vitamin C  500 mg Oral Daily  . zinc sulfate  220 mg Oral Daily   Continuous Infusions:  PRN Meds:.sodium chloride, sodium chloride, sodium chloride, acetaminophen, alteplase, artificial tears, heparin, ipratropium-albuterol, ondansetron (ZOFRAN) IV, oxyCODONE, sodium chloride flush     LOS: 3 days   Kobe Jansma A. [name], M.D.  Diplomate, Tax adviser of Psychiatry and Neurology ( Neurology).

## 2016-09-07 ENCOUNTER — Inpatient Hospital Stay (HOSPITAL_COMMUNITY): Payer: Medicare Other

## 2016-09-07 ENCOUNTER — Encounter (HOSPITAL_COMMUNITY): Payer: Self-pay | Admitting: Internal Medicine

## 2016-09-07 DIAGNOSIS — R569 Unspecified convulsions: Secondary | ICD-10-CM

## 2016-09-07 DIAGNOSIS — I5043 Acute on chronic combined systolic (congestive) and diastolic (congestive) heart failure: Secondary | ICD-10-CM

## 2016-09-07 DIAGNOSIS — L89152 Pressure ulcer of sacral region, stage 2: Secondary | ICD-10-CM

## 2016-09-07 LAB — GLUCOSE, CAPILLARY
GLUCOSE-CAPILLARY: 204 mg/dL — AB (ref 65–99)
GLUCOSE-CAPILLARY: 300 mg/dL — AB (ref 65–99)
GLUCOSE-CAPILLARY: 156 mg/dL — AB (ref 65–99)
Glucose-Capillary: 102 mg/dL — ABNORMAL HIGH (ref 65–99)
Glucose-Capillary: 90 mg/dL (ref 65–99)

## 2016-09-07 LAB — RENAL FUNCTION PANEL
Albumin: 1.9 g/dL — ABNORMAL LOW (ref 3.5–5.0)
Anion gap: 11 (ref 5–15)
BUN: 44 mg/dL — ABNORMAL HIGH (ref 6–20)
CHLORIDE: 90 mmol/L — AB (ref 101–111)
CO2: 33 mmol/L — AB (ref 22–32)
CREATININE: 1.87 mg/dL — AB (ref 0.61–1.24)
Calcium: 8.3 mg/dL — ABNORMAL LOW (ref 8.9–10.3)
GFR calc non Af Amer: 36 mL/min — ABNORMAL LOW (ref >60)
GFR, EST AFRICAN AMERICAN: 41 mL/min — AB (ref >60)
Glucose, Bld: 102 mg/dL — ABNORMAL HIGH (ref 65–99)
POTASSIUM: 3.9 mmol/L (ref 3.5–5.1)
Phosphorus: 4.1 mg/dL (ref 2.5–4.6)
Sodium: 134 mmol/L — ABNORMAL LOW (ref 135–145)

## 2016-09-07 LAB — CULTURE, RESPIRATORY

## 2016-09-07 LAB — BASIC METABOLIC PANEL
Anion gap: 8 (ref 5–15)
BUN: 44 mg/dL — AB (ref 6–20)
CALCIUM: 8.1 mg/dL — AB (ref 8.9–10.3)
CO2: 34 mmol/L — AB (ref 22–32)
Chloride: 92 mmol/L — ABNORMAL LOW (ref 101–111)
Creatinine, Ser: 1.78 mg/dL — ABNORMAL HIGH (ref 0.61–1.24)
GFR calc Af Amer: 44 mL/min — ABNORMAL LOW (ref >60)
GFR, EST NON AFRICAN AMERICAN: 38 mL/min — AB (ref >60)
GLUCOSE: 104 mg/dL — AB (ref 65–99)
Potassium: 4 mmol/L (ref 3.5–5.1)
Sodium: 134 mmol/L — ABNORMAL LOW (ref 135–145)

## 2016-09-07 LAB — CULTURE, RESPIRATORY W GRAM STAIN

## 2016-09-07 NOTE — Care Management Important Message (Signed)
Important Message  Patient Details  Name: [name] MRN: [id number] Date of Birth: Nov 13, 1951   Medicare Important Message Given:  Yes    Sherald Barge, RN 09/07/2016, 1:38 PM

## 2016-09-07 NOTE — Progress Notes (Signed)
Physical Therapy Treatment Patient Details Name: [name] MRN: [id number] DOB: 22-Mar-1951 Today's Date: 09/07/2016    History of Present Illness 65 y.o. male with medical history significant for insulin-dependent diabetes mellitus, rheumatoid arthritis, chronic systolic CHF, and chronic kidney disease stage III who presents from his SNF for evaluation of respiratory distress with productive cough. Patient had just been admitted for management of acute renal failure and volume overload, was dialyzed and diuresed, losing 10 pounds during the admission and with a net negative ~4.6 L. He was discharged to the SNF yesterday in much improved and stable condition, but since that time his respiratory status has re-worsened and his cough has increased and changed, now productive of thick discolored sputum. He reports worsening dyspnea at rest.  Dx: HCAP, acute on chronic diastolic CHF, acute encephalopathy after possible partial seizure on 9/10.  PMH: chronic back & neck pain, CAD with angioplasty after AMI, DM, diabetic neuropathy, HTN, pancytopenia, prolonged QT interval, RA, seizure, amputation of L foot 0/07/1447, systolic heart failure, pressure ulcer, ESRD needing dialysis, closed dislocation of MTP joint, cellulitis & abcess of LE, osteomyelitis of L foot    PT Comments    Pt received in bed, and was agreeable to PT tx.  Pt expressed desire to get into the chair today.  Pt continues to require Max A for transfer supine<>sit, and requires Mod A for initial static sitting balance.  He improved his balance to only requiring close supervision, and was able to practice lateral weight shifting onto elbows.  Total A for sit<>supine back into the bed, and Maxi move was utilized to transfer pt from the bed<>chair.  RN notified of pt's position and to use Maxi move to transfer pt BTB.   Follow Up Recommendations  SNF     Equipment Recommendations  None recommended by PT    Recommendations for Other  Services OT consult     Precautions / Restrictions Precautions Precautions: Fall Precaution Comments: due to immobility Restrictions Weight Bearing Restrictions: Yes LLE Weight Bearing: Non weight bearing Other Position/Activity Restrictions: L BKA in 04/2016    Mobility  Bed Mobility Overal bed mobility: Needs Assistance Bed Mobility: Supine to Sit;Sit to Supine     Supine to sit: Max assist;HOB elevated (use of bed pad to assist hips to the EOB.  ) Sit to supine: Total assist      Transfers Overall transfer level: Needs assistance                  Ambulation/Gait                 Stairs            Wheelchair Mobility    Modified Rankin (Stroke Patients Only)       Balance Overall balance assessment: Needs assistance Sitting-balance support: Bilateral upper extremity supported Sitting balance-Leahy Scale: Poor Sitting balance - Comments: Pt initially requires Mod A for sitting balance, however he was able to improve to close supervision.  Pt able to sit on EOB again for ~10 min.  Pt requires Mod A for lateral weight shifting down onto elbows both directions.  VC's to engage triceps to push himself up, as well as engage abdominals to pull trunk forward.  Maxi move sling place under pt while sitting on the EOB.                            Cognition Arousal/Alertness: Awake/alert Behavior  During Therapy: WFL for tasks assessed/performed Overall Cognitive Status: Within Functional Limits for tasks assessed                      Exercises      General Comments        Pertinent Vitals/Pain Pain Assessment: No/denies pain    Home Living                      Prior Function            PT Goals (current goals can now be found in the care plan section) Acute Rehab PT Goals Patient Stated Goal: Pt wants to get strong at rehab so he can go home.  PT Goal Formulation: With patient/family Time For Goal Achievement:  09/20/16 Potential to Achieve Goals: Fair Progress towards PT goals: Progressing toward goals    Frequency  Min 4X/week    PT Plan Current plan remains appropriate    Co-evaluation             End of Session Equipment Utilized During Treatment: Oxygen Activity Tolerance: Patient tolerated treatment well Patient left: in chair;with family/visitor present;with call bell/phone within reach     Time: 7793-9688 PT Time Calculation (min) (ACUTE ONLY): 49 min  Charges:  $Therapeutic Activity: 23-37 mins $Neuromuscular Re-education: 8-22 mins                    G Codes:      [name] Sep 11, 2016, 3:33 PM

## 2016-09-07 NOTE — Progress Notes (Signed)
PROGRESS NOTE    [name]  JJO:[id number] DOB: 1951/12/12 DOA: 09/02/2016 PCP: Lala Lund, MD    Brief Narrative:  Patient is a 65 year old man with a history of combined CHF, chronic kidney disease necessitating hemodialysis on a number of occasions, chronic hypotension, diabetes mellitus, steroid dependent rheumatoid arthritis, and a recent long hospitalization at [facility] for complications from peripheral vascular disease-status post left BKA. He was also recently discharged from [facility] on 08/30/16 for fluid overload from CKD. He presented again on 09/02/16 from [facility] with shortness of breath and cough. In the ED, he was afebrile and hemodynamically stable. His chest x-ray revealed bilateral pulmonary infiltrates suggestive of pulmonary edema, but with bibasilar pneumonia not excluded and a right pleural effusion. His lab data were significant for creatinine of 2.11, sodium of 129, potassium of 5.8, and glucose of 227. His hemoglobin was 9.8. He was admitted for further evaluation and management of HC AP plus/minus acute on chronic CHF and AKI.   Assessment & Plan:   Principal Problem:   HCAP (healthcare-associated pneumonia) Active Problems:   Rheumatoid arthritis ([place])   Hypertension   Insulin dependent diabetes mellitus ([place])   Seizure ([place])   Anemia of chronic disease   Acute on chronic diastolic CHF (congestive heart failure) (HCC)   Acute renal failure superimposed on stage 3 chronic kidney disease (HCC)   Hyperkalemia   Sacral decubitus ulcer    1. HCAP. The patient was started on treatment with cefepime and vancomycin. He was given Solu-Medrol empirically in the ED. Supportive treatment was given. Subsequent studies ordered and revealed a negative strep pneumo antigen, negative Legionella antigen, and negative HIV. Sputum culture pending. Patient is improving clinically and symptomatically. He has remained afebrile. -Would favor treating him 1 more day with  IV antibiotics. -Incentive spirometry and out of bed to the chair to increase aeration.  Acute on chronic systolic and diastolic congestive heart failure. Patient's BNP was greater than 4500 on admission. His chest x-ray revealed bilateral infiltrates which  could've been from a combination of pneumonia and pulmonary edema. 100 mg of IV Lasix was given in the ED. -2-D echocardiogram ordered and revealed an EF of 30-35% and grade 2 diastolic dysfunction. -Demadex was added by nephrology. -The patient underwent HD on 09/03/16. -Follow-up chest x-ray revealed slightly worsened aeration, but stable vascular congestion and interstitial prominence and small right pleural effusion.  Acute kidney injury superimposed on stage III chronic kidney disease. Patient's baseline creatinine ranged from 1.28-1.89. He has a history of hemodialysis during his hospitalization at [facility] for AK I. On admission, his creatinine was 2.11. He was given IV Lasix in the ED, but then subsequently started on IV fluids. Nephrology was consulted and discontinued IV Lasix and started oral Demadex. He was dialyzed once, pulling off 2.6 L. -Per nephrology, hemodialysis is no longer needed. The temporary HD catheter was discontinued on 09/06/16. -His creatinine has improved overall and tends to be stabilizing at 1.6-1.8.7.  Hyperkalemia. Patient's potassium on admission was 5.8. It subsequently increased to 6.1. He was given IV Lasix and Kayexalate with no significant improvement. Following hemodialysis, his serum potassium normalized. We'll continue to monitor.  Acute encephalopathy; query seizure Apparently, the patient became confused and subsequently had an episode on 09/04/16 lasting 5 minutes with his eyes rolled back and difficulty speaking followed by 10 minutes where he had difficulty recognizing his family members. It was unclear if this was a partial complex seizure. -Initially, trazodone  and Ativan were discontinued  as it was felt that his initial confusion was secondary to medications and his acute infection. -Neurologist, Dr. Merlene Laughter was consulted. Per Dr. Merlene Laughter, the spell of confusion and unresponsiveness etiology was unclear but could've been secondary to medication side effect or confusional arousal from sleep. Partial complex seizure was a possibility, but the EEG revealed no epileptiform activity. -Patient was continued on Lyrica with no other management changes.  Query history of SVT or PAF. Patient is on amiodarone, but the reason by his unclear. The family is unclear on why he is on this medication as well. We'll need to consider stopping it in the setting of persistent bilateral pulmonary infiltrates. Will try to investigate the reason of why he is on amiodarone.  Hypertension. Patient is treated chronically with metoprolol XL. His blood pressure has been controlled and stable on metoprolol XL.  Insulin-dependent diabetes mellitus. Patient is treated chronically with NPH insulin. It was withheld on admission in favor of sliding scale NovoLog and Lantus. His CBGs have been fairly controlled.  Rheumatoid arthritis. The patient has severe rheumatoid arthritis and is treated with chronic prednisone and Plaquenil. He was given IV Solu-Medrol 1 in the ED. -Currently stable.  Stage II sacral decubitus ulcer. Noted by nursing. Wound care nurse consulted. Recommendations for dressing changes noted and appreciated.  Dysphagia. ST evaluated the patient and recommended a dysphagia 3 with thin liquid diet.    DVT prophylaxis: Subcutaneous heparin Code Status: Full code Family Communication: Discussed with wife Disposition Plan: Discharged to SNF when medically appropriate, likely in the next 24-48 hours.   Consultants:   Nephrology  Gen. surgery to remove the HD catheter.  Neurology  Procedures: -Removal of 2 per HD catheter by general surgery on 09/06/16 -EEG 09/05/16 -Hemodialysis  09/03/16 -2-D echo 09/03/16:  Left ventricle: The cavity size was normal. Wall thickness was   normal. Systolic function was moderately to severely reduced. The   estimated ejection fraction was in the range of 30% to 35%.   Features are consistent with a pseudonormal left ventricular   filling pattern, with concomitant abnormal relaxation and   increased filling pressure (grade 2 diastolic dysfunction).   Doppler parameters are consistent with high ventricular filling   pressure. - Regional wall motion abnormality: Severe hypokinesis of the   mid-apical anterior and mid anteroseptal myocardium. - Aortic valve: Moderately to severely calcified annulus. Mildly   thickened, moderately calcified leaflets. There was mild to   moderate stenosis. Peak velocity (S): 233 cm/s. Mean gradient   (S): 12 mm Hg. Valve area (VTI): 1.23 cm^2. Valve area (Vmax):   1.31 cm^2. Valve area (Vmean): 1.19 cm^2. - Mitral valve: Mildly calcified annulus. Mildly calcified leaflets   . There was moderate regurgitation. - Left atrium: The atrium was mildly dilated. - Right ventricle: Systolic function was moderately reduced. - Tricuspid valve: There was mild regurgitation.    Antimicrobials:  Vancomycin 9/8>>  Cefepime 9/8 >>   Subjective: Patient says that he is not feeling well today, but does not point to anything specific. He denies chest pain or shortness of breath. He has chronic joint pain.  Objective: Vitals:   09/06/16 2042 09/06/16 2157 09/07/16 0503 09/07/16 0743  BP: 134/64  (!) 116/58   Pulse: 90  80   Resp:  18 16   Temp: 98.8 F (37.1 C)  97.7 F (36.5 C)   TempSrc: Oral  Oral   SpO2: 100%  99% 94%  Weight:   83.8 kg (  184 lb 11.9 oz)   Height:        Intake/Output Summary (Last 24 hours) at 09/07/16 1243 Last data filed at 09/07/16 1145  Gross per 24 hour  Intake              733 ml  Output             5300 ml  Net            -4567 ml   Filed Weights   09/05/16 0449 09/06/16  0656 09/07/16 0503  Weight: 88.7 kg (195 lb 8.8 oz) 85.1 kg (187 lb 9.8 oz) 83.8 kg (184 lb 11.9 oz)    Examination:  General exam: Appears calm and comfortable  Respiratory system: Few mid lobe to lower lobe crackles; breathing nonlabored. Respiratory effort normal. Cardiovascular system: S1 & S2 with a soft systolic murmur. No JVD.[name] 1+ pitting edema of right lower extremity. Gastrointestinal system: Abdomen is nondistended, soft and nontender. No organomegaly or masses felt. Normal bowel sounds heard. Central nervous system: Alert and oriented. No focal neurological deficits. Extremities:Severe rheumatoid arthritic changes with hypertrophic DIP/PIP and MCP joints with no erythema or joint swelling. Left lower extremity BKA noted. Skin: Small eschar on the pretibial surface of the left stump; no drainage. Psychiatry: Judgement and insight appear normal. Mood & affect appropriate.     Data Reviewed: I have personally reviewed following labs and imaging studies  CBC:  Recent Labs Lab 09/01/16 0735 09/02/16 1559 09/03/16 1500 09/04/16 0627 09/05/16 0558  WBC 8.8 10.1 9.2 8.5 8.2  NEUTROABS  --  8.6*  --   --   --   HGB 9.5* 9.8* 8.9* 9.2* 9.2*  HCT 29.9* 30.5* 27.8* 29.3* 28.9*  MCV 96.1 96.2 96.2 99.0 98.0  PLT 232 248 263 224 497   Basic Metabolic Panel:  Recent Labs Lab 09/04/16 0627 09/05/16 0558 09/06/16 0623 09/07/16 0541 09/07/16 0544  NA 136  135 133*  133* 133* 134* 134*  K 3.8  3.8 3.9  3.8 3.8 4.0 3.9  CL 96*  96* 94*  95* 96* 92* 90*  CO2 31  31 29  30  32 34* 33*  GLUCOSE 168*  168* 163*  166* 170* 104* 102*  BUN 27*  28* 35*  35* 39* 44* 44*  CREATININE 1.31*  1.26* 1.62*  1.52* 1.66* 1.78* 1.87*  CALCIUM 8.5*  8.4* 8.2*  8.1* 8.0* 8.1* 8.3*  PHOS 3.6 3.9 4.0  --  4.1   GFR: Estimated Creatinine Clearance: 38.3 mL/min (by C-G formula based on SCr of 1.87 mg/dL (H)). Liver Function Tests:  Recent Labs Lab 09/04/16 0627  09/05/16 0558 09/06/16 0623 09/07/16 0544  ALBUMIN 2.1* 1.9* 1.9* 1.9*   No results for input(s): LIPASE, AMYLASE in the last 168 hours. No results for input(s): AMMONIA in the last 168 hours. Coagulation Profile: No results for input(s): INR, PROTIME in the last 168 hours. Cardiac Enzymes: No results for input(s): CKTOTAL, CKMB, CKMBINDEX, TROPONINI in the last 168 hours. BNP (last 3 results) No results for input(s): PROBNP in the last 8760 hours. HbA1C: No results for input(s): HGBA1C in the last 72 hours. CBG:  Recent Labs Lab 09/06/16 1704 09/06/16 2036 09/07/16 0737 09/07/16 0759 09/07/16 1132  GLUCAP 154* 190* 90 102* 204*   Lipid Profile: No results for input(s): CHOL, HDL, LDLCALC, TRIG, CHOLHDL, LDLDIRECT in the last 72 hours. Thyroid Function Tests: No results for input(s): TSH, T4TOTAL, FREET4, T3FREE, THYROIDAB in the last 72  hours. Anemia Panel: No results for input(s): VITAMINB12, FOLATE, FERRITIN, TIBC, IRON, RETICCTPCT in the last 72 hours. Sepsis Labs: No results for input(s): PROCALCITON, LATICACIDVEN in the last 168 hours.  Recent Results (from the past 240 hour(s))  Culture, respiratory (NON-Expectorated)     Status: None (Preliminary result)   Collection Time: 09/03/16  7:50 PM  Result Value Ref Range Status   Specimen Description SPUTUM  Final   Special Requests NONE  Final   Gram Stain   Final    ABUNDANT WBC PRESENT,BOTH PMN AND MONONUCLEAR ABUNDANT GRAM POSITIVE COCCI IN PAIRS MODERATE GRAM POSITIVE RODS    Culture   Final    CULTURE REINCUBATED FOR BETTER GROWTH Performed at [facility]    Report Status PENDING  Incomplete         Radiology Studies: Dg Chest Port 1 View  Result Date: 09/07/2016 CLINICAL DATA:  Congestive heart failure EXAM: PORTABLE CHEST 1 VIEW COMPARISON:  09/02/2016 FINDINGS: Cardiomegaly again noted. Status post CABG. Slight worsening in aeration. Central vascular congestion and mild interstitial  prominence bilaterally suspicious for pulmonary edema. Small right pleural effusion with right basilar atelectasis or infiltrate. Extensive degenerative changes bilateral shoulders. IMPRESSION: Slight worsening in aeration. Central vascular congestion and mild interstitial prominence bilaterally suspicious for pulmonary edema. Small right pleural effusion with right basilar atelectasis or infiltrate. Extensive degenerative changes bilateral shoulders. Electronically Signed   By: Lahoma Crocker M.D.   On: 09/07/2016 09:17        Scheduled Meds: . amiodarone  200 mg Oral Daily  . aspirin  81 mg Oral q morning - 10a  . atorvastatin  80 mg Oral q1800  . ceFEPime (MAXIPIME) IV  2 g Intravenous Q24H  . chlorhexidine  15 mL Mouth Rinse BID  . dextrose  25 mL Intravenous Once  . docusate sodium  100 mg Oral BID  . epoetin (EPOGEN/PROCRIT) injection  10,000 Units Intravenous Q M,W,F-HD  . feeding supplement (PRO-STAT SUGAR FREE 64)  30 mL Oral BID  . heparin  5,000 Units Subcutaneous Q8H  . hydroxychloroquine  200 mg Oral Daily  . insulin aspart  0-9 Units Subcutaneous TID WC  . insulin glargine  8 Units Subcutaneous QHS  . ipratropium-albuterol  3 mL Nebulization BID  . lidocaine (PF)  15 mL Intradermal Once  . mouth rinse  15 mL Mouth Rinse q12n4p  . metoprolol succinate  25 mg Oral q morning - 10a  . olopatadine  1 drop Both Eyes BID  . omega-3 acid ethyl esters  2 g Oral BID  . polyethylene glycol  17 g Oral BID  . polyvinyl alcohol  2 drop Both Eyes TID  . predniSONE  7.5 mg Oral Q breakfast  . pregabalin  25 mg Oral QHS  . sodium chloride flush  3 mL Intravenous Q12H  . tamsulosin  0.4 mg Oral QHS  . thiamine  100 mg Oral Daily  . torsemide  60 mg Oral Daily  . vancomycin  1,000 mg Intravenous Q24H  . vitamin C  500 mg Oral Daily  . zinc sulfate  220 mg Oral Daily   Continuous Infusions:    LOS: 4 days    Time spent: 35 minutes    Rexene Alberts, MD [facility] Pager  [phone]   If 7PM-7AM, please contact night-coverage [email or web address] Password [facility] 09/07/2016, 12:43 PM

## 2016-09-07 NOTE — Progress Notes (Signed)
[name]  MRN: [id number]  DOB/AGE: Nov 12, 1951 65 y.o.  Primary Care Physician:JONAS, Archer Asa, MD  Admit date: 09/02/2016  Chief Complaint:  Chief Complaint  Patient presents with  . Shortness of Breath    S-Pt presented on  09/02/2016 with  Chief Complaint  Patient presents with  . Shortness of Breath  .     Pt says " My sore on bottom is bothering me "   Meds . amiodarone  200 mg Oral Daily  . aspirin  81 mg Oral q morning - 10a  . atorvastatin  80 mg Oral q1800  . ceFEPime (MAXIPIME) IV  2 g Intravenous Q24H  . chlorhexidine  15 mL Mouth Rinse BID  . dextrose  25 mL Intravenous Once  . docusate sodium  100 mg Oral BID  . epoetin (EPOGEN/PROCRIT) injection  10,000 Units Intravenous Q M,W,F-HD  . feeding supplement (PRO-STAT SUGAR FREE 64)  30 mL Oral BID  . heparin  5,000 Units Subcutaneous Q8H  . hydroxychloroquine  200 mg Oral Daily  . insulin aspart  0-9 Units Subcutaneous TID WC  . insulin glargine  8 Units Subcutaneous QHS  . ipratropium-albuterol  3 mL Nebulization BID  . lidocaine (PF)  15 mL Intradermal Once  . mouth rinse  15 mL Mouth Rinse q12n4p  . metoprolol succinate  25 mg Oral q morning - 10a  . olopatadine  1 drop Both Eyes BID  . omega-3 acid ethyl esters  2 g Oral BID  . polyethylene glycol  17 g Oral BID  . polyvinyl alcohol  2 drop Both Eyes TID  . predniSONE  7.5 mg Oral Q breakfast  . pregabalin  25 mg Oral QHS  . sodium chloride flush  3 mL Intravenous Q12H  . tamsulosin  0.4 mg Oral QHS  . thiamine  100 mg Oral Daily  . torsemide  60 mg Oral Daily  . vancomycin  1,000 mg Intravenous Q24H  . vitamin C  500 mg Oral Daily  . zinc sulfate  220 mg Oral Daily    Physical Exam: Vital signs in last 24 hours: Temp:  [97.7 F (36.5 C)-98.8 F (37.1 C)] 97.7 F (36.5 C) (09/13 0503) Pulse Rate:  [80-90] 80 (09/13 0503) Resp:  [16-18] 16 (09/13 0503) BP: (116-134)/(58-69) 116/58 (09/13 0503) SpO2:  [94 %-100 %] 94 % (09/13 0743) Weight:   [184 lb 11.9 oz (83.8 kg)] 184 lb 11.9 oz (83.8 kg) (09/13 0503) Weight change: -2 lb 13.9 oz (-1.3 kg)    Intake/Output from previous day: 09/12 0701 - 09/13 0700 In: 733 [P.O.:480; I.V.:3; IV Piggyback:250] Out: 3500 [Urine:3500] No intake/output data recorded.   Physical Exam: General- pt is awake, follows coomands Resp- No acute REsp distress, decreased at base. CVS- S1S2 regular in rate and rhythm GIT- BS+, soft, NT, ND EXT- 1-+ LE Edema,No Cyanosis           Left BKA   Lab Results: CBC  Recent Labs  09/05/16 0558  WBC 8.2  HGB 9.2*  HCT 28.9*  PLT 216    BMET  Recent Labs  09/07/16 0541 09/07/16 0544  NA 134* 134*  K 4.0 3.9  CL 92* 90*  CO2 34* 33*  GLUCOSE 104* 102*  BUN 44* 44*  CREATININE 1.78* 1.87*  CALCIUM 8.1* 8.3*   Creat 2017  1.6--1.89  MICRO Recent Results (from the past 240 hour(s))  Culture, respiratory (NON-Expectorated)     Status: None (Preliminary result)  Collection Time: 09/03/16  7:50 PM  Result Value Ref Range Status   Specimen Description SPUTUM  Final   Special Requests NONE  Final   Gram Stain   Final    ABUNDANT WBC PRESENT,BOTH PMN AND MONONUCLEAR ABUNDANT GRAM POSITIVE COCCI IN PAIRS MODERATE GRAM POSITIVE RODS    Culture   Final    CULTURE REINCUBATED FOR BETTER GROWTH Performed at [facility]    Report Status PENDING  Incomplete      Lab Results  Component Value Date   CALCIUM 8.3 (L) 09/07/2016   PHOS 4.1 09/07/2016        Impression: 1)Renal  CKD stage 3.               AKI required HD since admission at [facility]               Pt was last dialyzed Friday(08/26/16).               Pt creat now holding at 1.6--1.8               Pt with adequate urine output               NO need of HD   2)HTN  Medication- On Beta blockers    3)Anemia HGb stable Anemia of chronic disease On epo  4)Rheumatology-hx of Rheumatoid arthritis On Hydroxychloroquine ans Steriods Primary MD  following  5)CNS-hx of Seizure disorder Primary MD following  6)Electrolytes  Normokalemic  Hyponatremic    Hypervolemia Hyponatremia    On  lasix  7)Acid base Co2 at goal      Plan:  Will continue current care    Sabine S 09/07/2016, 8:56 AM

## 2016-09-07 NOTE — Progress Notes (Signed)
Dressings on sacrum, right upper leg, and left BKA amputation changed. Patient tolerated well.

## 2016-09-08 ENCOUNTER — Encounter (HOSPITAL_COMMUNITY): Payer: Self-pay

## 2016-09-08 ENCOUNTER — Inpatient Hospital Stay (HOSPITAL_COMMUNITY): Payer: Medicare Other

## 2016-09-08 DIAGNOSIS — R0602 Shortness of breath: Secondary | ICD-10-CM

## 2016-09-08 DIAGNOSIS — I4581 Long QT syndrome: Secondary | ICD-10-CM

## 2016-09-08 LAB — BASIC METABOLIC PANEL
Anion gap: 8 (ref 5–15)
BUN: 47 mg/dL — ABNORMAL HIGH (ref 6–20)
CO2: 35 mmol/L — ABNORMAL HIGH (ref 22–32)
Calcium: 8.3 mg/dL — ABNORMAL LOW (ref 8.9–10.3)
Chloride: 91 mmol/L — ABNORMAL LOW (ref 101–111)
Creatinine, Ser: 1.8 mg/dL — ABNORMAL HIGH (ref 0.61–1.24)
GFR, EST AFRICAN AMERICAN: 43 mL/min — AB (ref >60)
GFR, EST NON AFRICAN AMERICAN: 37 mL/min — AB (ref >60)
GLUCOSE: 177 mg/dL — AB (ref 65–99)
POTASSIUM: 3.7 mmol/L (ref 3.5–5.1)
SODIUM: 134 mmol/L — AB (ref 135–145)

## 2016-09-08 LAB — TROPONIN I
TROPONIN I: 0.04 ng/mL — AB (ref <0.03)
Troponin I: 0.05 ng/mL (ref <0.03)

## 2016-09-08 LAB — GLUCOSE, CAPILLARY
GLUCOSE-CAPILLARY: 167 mg/dL — AB (ref 65–99)
GLUCOSE-CAPILLARY: 177 mg/dL — AB (ref 65–99)
Glucose-Capillary: 151 mg/dL — ABNORMAL HIGH (ref 65–99)
Glucose-Capillary: 203 mg/dL — ABNORMAL HIGH (ref 65–99)

## 2016-09-08 LAB — CBC
HEMATOCRIT: 28.7 % — AB (ref 39.0–52.0)
HEMOGLOBIN: 9.4 g/dL — AB (ref 13.0–17.0)
MCH: 31.4 pg (ref 26.0–34.0)
MCHC: 32.8 g/dL (ref 30.0–36.0)
MCV: 96 fL (ref 78.0–100.0)
Platelets: 255 10*3/uL (ref 150–400)
RBC: 2.99 MIL/uL — AB (ref 4.22–5.81)
RDW: 16.5 % — ABNORMAL HIGH (ref 11.5–15.5)
WBC: 10.1 10*3/uL (ref 4.0–10.5)

## 2016-09-08 MED ORDER — METOPROLOL SUCCINATE ER 25 MG PO TB24: 37.5000 mg | ORAL_TABLET | Freq: Every morning | ORAL | Status: DC

## 2016-09-08 MED ORDER — LEVALBUTEROL HCL 0.63 MG/3ML IN NEBU: 0.6300 mg | INHALATION_SOLUTION | RESPIRATORY_TRACT | Status: DC | PRN

## 2016-09-08 MED ORDER — TECHNETIUM TC 99M DIETHYLENETRIAME-PENTAACETIC ACID
30.0000 | Freq: Once | INTRAVENOUS | Status: AC | PRN
  Administered 2016-09-08: 31 via RESPIRATORY_TRACT

## 2016-09-08 MED ORDER — ALPRAZOLAM 0.25 MG PO TABS
0.2500 mg | ORAL_TABLET | Freq: Three times a day (TID) | ORAL | Status: DC | PRN
  Administered 2016-09-08 – 2016-09-09 (×2): 0.25 mg via ORAL
  Filled 2016-09-08 (×2): qty 1

## 2016-09-08 MED ORDER — TORSEMIDE 20 MG PO TABS: 40.0000 mg | ORAL_TABLET | Freq: Every day | ORAL | Status: DC

## 2016-09-08 MED ORDER — MORPHINE SULFATE (PF) 2 MG/ML IV SOLN: 2.0000 mg | INTRAVENOUS | Status: DC | PRN

## 2016-09-08 MED ORDER — TECHNETIUM TO 99M ALBUMIN AGGREGATED
4.0000 | Freq: Once | INTRAVENOUS | Status: AC | PRN
  Administered 2016-09-08: 3.9 via INTRAVENOUS

## 2016-09-08 MED ORDER — TORSEMIDE 20 MG PO TABS
40.0000 mg | ORAL_TABLET | Freq: Every day | ORAL | Status: DC
  Administered 2016-09-08 – 2016-09-09 (×2): 40 mg via ORAL
  Filled 2016-09-08 (×2): qty 2

## 2016-09-08 MED ORDER — POTASSIUM CHLORIDE ER 10 MEQ PO TBCR: 15.0000 meq | EXTENDED_RELEASE_TABLET | Freq: Every day | ORAL | Status: DC

## 2016-09-08 NOTE — Care Management Note (Signed)
Case Management Note  Patient Details  Name: AVYN COATE MRN: [id number] Date of Birth: [birth year]   Expected Discharge Date:   09/08/2016               Expected Discharge Plan:  [place]  In-House Referral:  Clinical Social Work  Discharge planning Services  CM Consult  Post Acute Care Choice:  NA Choice offered to:  NA  DME Arranged:    DME Agency:     HH Arranged:    [place] Agency:     Status of Service:  Completed, signed off  If discussed at [employer] of Stay Meetings, dates discussed:  09/08/2016  Additional Comments: Pt discharging back to SNF today. CSW is aware and will make arrangements for return to facility. No CM needs.  Sherald Barge, RN 09/08/2016, 2:20 PM

## 2016-09-08 NOTE — Progress Notes (Signed)
Subjective: Interval History: Patient complains of some cough this morning. He said he had difficulty breathing last night but is feeling much better.  Objective: Vital signs in last 24 hours: Temp:  [97.5 F (36.4 C)-98.7 F (37.1 C)] 97.5 F (36.4 C) (09/14 0605) Pulse Rate:  [81-84] 84 (09/14 0605) Resp:  [18-20] 20 (09/14 0605) BP: (124-134)/(54-61) 134/61 (09/14 0605) SpO2:  [96 %-100 %] 96 % (09/14 0750) Weight change:   Intake/Output from previous day: 09/13 0701 - 09/14 0700 In: 680 [P.O.:480; IV Piggyback:200] Out: 5250 [Urine:5250] Intake/Output this shift: No intake/output data recorded.  Generally patient is alert and calmer  today. His Less agitated Chest: Decreased breath sound bilaterally but seems to be clear Heart exam regular rate and rhythm Extremities he has 1+ edema on the right and left BKA Lab Results:  Recent Labs  09/08/16 0546  WBC 10.1  HGB 9.4*  HCT 28.7*  PLT 255   BMET:   Recent Labs  09/07/16 0544 09/08/16 0546  NA 134* 134*  K 3.9 3.7  CL 90* 91*  CO2 33* 35*  GLUCOSE 102* 177*  BUN 44* 47*  CREATININE 1.87* 1.80*  CALCIUM 8.3* 8.3*   No results for input(s): PTH in the last 72 hours. Iron Studies: No results for input(s): IRON, TIBC, TRANSFERRIN, FERRITIN in the last 72 hours.  Studies/Results: Dg Chest Port 1 View  Result Date: 09/07/2016 CLINICAL DATA:  Congestive heart failure EXAM: PORTABLE CHEST 1 VIEW COMPARISON:  09/02/2016 FINDINGS: Cardiomegaly again noted. Status post CABG. Slight worsening in aeration. Central vascular congestion and mild interstitial prominence bilaterally suspicious for pulmonary edema. Small right pleural effusion with right basilar atelectasis or infiltrate. Extensive degenerative changes bilateral shoulders. IMPRESSION: Slight worsening in aeration. Central vascular congestion and mild interstitial prominence bilaterally suspicious for pulmonary edema. Small right pleural effusion with right  basilar atelectasis or infiltrate. Extensive degenerative changes bilateral shoulders. Electronically Signed   By: Lahoma Crocker M.D.   On: 09/07/2016 09:17    I have reviewed the patient's current medications.  Assessment/Plan: Problem #1 difficulty in breathing possibly multifactorial. Patient remains on Demadex and he had 5200 mL of urine output. Problem #2 hyperkalemia: His potassium is normal. Problem #3 chronic renal failure presently he doesn't have any nausea or vomiting. Patient is presently off dialysis. He doesn't have any uremic sinus symptoms. Problem #4 altered mental status: Seems to be improving. Most likely from medications. Problem #5 history of diabetes: His blood sugar is reasonably controlled. Problem #6 anemia: His hemoglobin is slightly below target goal Problem #7 metabolic bone disease: His calcium and phosphorus is range. Patient is not on a binder. Plan: 1) Will decrease Demadex to 40 mg once a day. 2] will follow patient in 2 weeks once she is discharged. 3] will check his renal panel in the morning.     LOS: 5 days   Cheyanne Lamison S 09/08/2016,8:15 AM

## 2016-09-08 NOTE — Progress Notes (Signed)
Speech Language Pathology Treatment: Dysphagia  Patient Details Name: [name] MRN: [id number] DOB: April 13, 1951 Today's Date: 09/08/2016 Time: 7616-0737 SLP Time Calculation (min) (ACUTE ONLY): 19 min  Assessment / Plan / Recommendation Clinical Impression  [name] was seen at bedside with wife present after his lunch meal. Chest x-ray reviewed: Slight worsening in aeration. Central vascular congestion and mild interstitial prominence bilaterally suspicious for pulmonary edema. Small right pleural effusion with right basilar atelectasis or infiltrate. Extensive degenerative changes bilateral shoulders. [name] was observed with cup and straw sips thin liquid and shows no overt signs or symptoms of aspiration. Safe swallow strategies, reflux precautions, and oral hygiene techniques were reiterated with pt and his wife. Oral care appears excellent. Continue diet as ordered and consider MBSS if silent aspiration is suspected.   HPI HPI: 65 y/o man admitted on 9/8 from [facility] with SOB. Found to have pulmonary edema as well as possible HCAP. Overnight has developed more confusing, agitation and delirium. Confusion has resolved.      SLP Plan  Discharge SLP treatment due to (comment)     Recommendations  Diet recommendations: Dysphagia 3 (mechanical soft);Thin liquid Liquids provided via: Cup;Straw Medication Administration: Whole meds with liquid Supervision: Staff to assist with self feeding;Full supervision/cueing for compensatory strategies Compensations: Slow rate;Small sips/bites;Lingual sweep for clearance of pocketing;Multiple dry swallows after each bite/sip Postural Changes and/or Swallow Maneuvers: Seated upright 90 degrees;Upright 30-60 min after meal             Oral Care Recommendations: Oral care BID Follow up Recommendations: Skilled Nursing facility Plan: Discharge SLP treatment due to (comment)     Thank you,  Genene Churn, [place]        [place] 09/08/2016, 3:55 PM

## 2016-09-08 NOTE — Progress Notes (Signed)
PROGRESS NOTE    [name]  EQA:[id number] DOB: 28-Oct-1951 DOA: 09/02/2016 PCP: Lala Lund, MD    Brief Narrative:  Patient is a 65 year old man with a history of combined CHF, chronic kidney disease necessitating hemodialysis on a number of occasions, chronic hypotension, diabetes mellitus, steroid dependent rheumatoid arthritis, and a recent long hospitalization at [facility] for complications from peripheral vascular disease-status post left BKA. He was also recently discharged from [facility] on 08/30/16 for fluid overload from CKD. He presented again on 09/02/16 from [facility] with shortness of breath and cough. In the ED, he was afebrile and hemodynamically stable. His chest x-ray revealed bilateral pulmonary infiltrates suggestive of pulmonary edema, but with bibasilar pneumonia not excluded and a right pleural effusion. His lab data were significant for creatinine of 2.11, sodium of 129, potassium of 5.8, and glucose of 227. His hemoglobin was 9.8. He was admitted for further evaluation and management of HC AP plus/minus acute on chronic CHF and AKI.   Assessment & Plan:   Principal Problem:   HCAP (healthcare-associated pneumonia) Active Problems:   Rheumatoid arthritis ([place])   Prolonged QT interval   Shortness of breath   Hypertension   Insulin dependent diabetes mellitus (HCC)   Seizure (HCC)   Anemia of chronic disease   Acute on chronic combined systolic and diastolic CHF (congestive heart failure) (HCC)   Acute renal failure superimposed on stage 3 chronic kidney disease (HCC)   Hyperkalemia   Sacral decubitus ulcer    Chest pain and shortness of breath. When the patient was informed that he would be discharged today, he complained of chest pain and shortness of breath. His chest x-ray yesterday was stable with interstitial prominence. Troponin I was ordered today and was barely above normal range at 0.04.  EKG was ordered and revealed prolonged QT interval, PACs,  but no worrisome ST or T-wave abnormalities. On exam, his lungs are clear anteriorly with decreased breath sounds in the bases. He is oxygenating 100% on 2 L of nasal cannula oxygen. -For further evaluation, will order noncontrasted CT and a VQ scan.  Psychosocial. The patient appears to have some reservation about being discharged back to the skilled nursing facility. Yesterday and today, he developed somatic complaints and did not want to be discharged from the hospital. He appears anxious. -We'll add when necessary Xanax. Reassurance was attempted.  History of PSVT. Patient is on amiodarone, but the reason by his unclear to the patient and family. Therefore, in review of his chart, it was noted that he had a history of PSVT. In light of the patient's chest x-ray findings and prolonged QT interval, will stop amiodarone and monitor. Will consider increasing Toprol-XL to 37.5 mg daily.   Prolonged QT interval. The patient has a noted history of prolonged QT interval, dating back at least a couple of years. It was presumed to be secondary to methadone and amitriptyline. Methadone was recently discontinued during a previous hospitalization. His magnesium level was on the high side of normal less than 2 weeks ago. Will order another magnesium level. -As above, will hold amiodarone and follow.   HCAP. The patient was started on treatment with cefepime and vancomycin. He was given Solu-Medrol empirically in the ED. Supportive treatment was given. Subsequent studies ordered and revealed a negative strep pneumo antigen, negative Legionella antigen, and negative HIV. Sputum culture pending. Patient is improving clinically and symptomatically. He has remained afebrile. -Incentive spirometry and out of bed to the chair  to increase aeration was ordered. -We'll discontinue IV antibiotics to complete 7 days of treatment.  Acute on chronic systolic and diastolic congestive heart failure. Patient's BNP was  greater than 4500 on admission. His chest x-ray revealed bilateral infiltrates which  could've been from a combination of pneumonia and pulmonary edema. 100 mg of IV Lasix was given in the ED. -2-D echocardiogram ordered and revealed an EF of 30-35% and grade 2 diastolic dysfunction. -Demadex was added by nephrology. -The patient underwent HD on 09/03/16. -Follow-up chest x-ray revealed slightly worsened aeration, but stable vascular congestion and interstitial prominence and small right pleural effusion.  Acute kidney injury superimposed on stage III chronic kidney disease. Patient's baseline creatinine ranged from 1.28-1.89. He has a history of hemodialysis during his hospitalization at [facility] for AK I. On admission, his creatinine was 2.11. He was given IV Lasix in the ED, but then subsequently started on IV fluids. Nephrology was consulted and discontinued IV Lasix and started oral Demadex. He was dialyzed once, pulling off 2.6 L. -Per nephrology, hemodialysis is no longer needed. The temporary HD catheter was discontinued on 09/06/16. -His creatinine has improved overall and tends to be stabilizing at 1.6-1.8.7.  Hyperkalemia. Patient's potassium on admission was 5.8. It subsequently increased to 6.1. He was given IV Lasix and Kayexalate with no significant improvement. Following hemodialysis, his serum potassium normalized. We'll continue to monitor.  Acute encephalopathy; query seizure Apparently, the patient became confused and subsequently had an episode on 09/04/16 lasting 5 minutes with his eyes rolled back and difficulty speaking followed by 10 minutes where he had difficulty recognizing his family members. It was unclear if this was a partial complex seizure. -Initially, trazodone and Ativan were discontinued as it was felt that his initial confusion was secondary to medications and his acute infection. -Neurologist, Dr. Merlene Laughter was consulted. Per Dr. Merlene Laughter, the spell of  confusion and unresponsiveness etiology was unclear but could've been secondary to medication side effect or confusional arousal from sleep. Partial complex seizure was a possibility, but the EEG revealed no epileptiform activity. -Patient was continued on Lyrica with no other management changes.  Hypertension. Patient is treated chronically with metoprolol XL. His blood pressure has been controlled and stable on metoprolol XL.  Insulin-dependent diabetes mellitus. Patient is treated chronically with NPH insulin. It was withheld on admission in favor of sliding scale NovoLog and Lantus. His CBGs have been fairly controlled.  Rheumatoid arthritis. The patient has severe rheumatoid arthritis and is treated with chronic prednisone and Plaquenil. He was given IV Solu-Medrol 1 in the ED. he was restarted on prednisone and Plaquenil. -Currently stable.  Stage II sacral decubitus ulcer. Noted by nursing. Wound care nurse consulted. Recommendations for dressing changes noted and appreciated.  Dysphagia. ST evaluated the patient and recommended a dysphagia 3 with thin liquid diet.    DVT prophylaxis: Subcutaneous heparin Code Status: Full code Family Communication: Discussed with wife Disposition Plan: Discharged to SNF when medically appropriate, likely in the next 24 hours.   Consultants:   Nephrology  Gen. surgery to remove the HD catheter.  Neurology  Procedures: -Removal of 2 per HD catheter by general surgery on 09/06/16 -EEG 09/05/16 -Hemodialysis 09/03/16 -2-D echo 09/03/16:  Left ventricle: The cavity size was normal. Wall thickness was   normal. Systolic function was moderately to severely reduced. The   estimated ejection fraction was in the range of 30% to 35%.   Features are consistent with a pseudonormal left ventricular   filling  pattern, with concomitant abnormal relaxation and   increased filling pressure (grade 2 diastolic dysfunction).   Doppler parameters are  consistent with high ventricular filling   pressure. - Regional wall motion abnormality: Severe hypokinesis of the   mid-apical anterior and mid anteroseptal myocardium. - Aortic valve: Moderately to severely calcified annulus. Mildly   thickened, moderately calcified leaflets. There was mild to   moderate stenosis. Peak velocity (S): 233 cm/s. Mean gradient   (S): 12 mm Hg. Valve area (VTI): 1.23 cm^2. Valve area (Vmax):   1.31 cm^2. Valve area (Vmean): 1.19 cm^2. - Mitral valve: Mildly calcified annulus. Mildly calcified leaflets   . There was moderate regurgitation. - Left atrium: The atrium was mildly dilated. - Right ventricle: Systolic function was moderately reduced. - Tricuspid valve: There was mild regurgitation.    Antimicrobials:  Vancomycin 9/8>>  Cefepime 9/8 >>   Subjective: Patient complained of chest pain earlier and then subsequently complained of shortness of breath. Nursing reports that he is oxygenating 100% and does not appear to be dyspneic. Patient says he is short of breath and does not feel he needs to be discharged today.  Objective: Vitals:   09/08/16 0605 09/08/16 0750 09/08/16 1310 09/08/16 1423  BP: 134/61  130/64   Pulse: 84  95   Resp: 20  20   Temp: 97.5 F (36.4 C)  98.5 F (36.9 C)   TempSrc: Oral  Oral   SpO2: 100% 96% 100% 100%  Weight:      Height:        Intake/Output Summary (Last 24 hours) at 09/08/16 1518 Last data filed at 09/08/16 1200  Gross per 24 hour  Intake              720 ml  Output             4350 ml  Net            -3630 ml   Filed Weights   09/05/16 0449 09/06/16 0656 09/07/16 0503  Weight: 88.7 kg (195 lb 8.8 oz) 85.1 kg (187 lb 9.8 oz) 83.8 kg (184 lb 11.9 oz)    Examination:  General exam: Anxious but he does not appear to be in respiratory distress.  Respiratory system: Lungs are clear anteriorly, but with a few mid lobe to lower lobe crackles; questionable labored breathing associated with some  anxiety. Cardiovascular system: S1 & S2 with a soft systolic murmur. No JVD.. Trace-1+ pitting edema of right lower extremity. Gastrointestinal system: Abdomen is nondistended, soft and nontender. No organomegaly or masses felt. Normal bowel sounds heard. Central nervous system: Anxious. No focal neurological deficits. Extremities:Severe rheumatoid arthritic changes with hypertrophic DIP/PIP and MCP joints with no erythema or joint swelling. Left lower extremity BKA noted. Skin: Small eschar on the pretibial surface of the left stump; no drainage. Psychiatry: Anxious and does not appear to want to be discharged.     Data Reviewed: I have personally reviewed following labs and imaging studies  CBC:  Recent Labs Lab 09/02/16 1559 09/03/16 1500 09/04/16 0627 09/05/16 0558 09/08/16 0546  WBC 10.1 9.2 8.5 8.2 10.1  NEUTROABS 8.6*  --   --   --   --   HGB 9.8* 8.9* 9.2* 9.2* 9.4*  HCT 30.5* 27.8* 29.3* 28.9* 28.7*  MCV 96.2 96.2 99.0 98.0 96.0  PLT 248 263 224 216 371   Basic Metabolic Panel:  Recent Labs Lab 09/04/16 0627 09/05/16 0558 09/06/16 0626 09/07/16 0541 09/07/16 0544 09/08/16 0546  NA 136  135 133*  133* 133* 134* 134* 134*  K 3.8  3.8 3.9  3.8 3.8 4.0 3.9 3.7  CL 96*  96* 94*  95* 96* 92* 90* 91*  CO2 31  31 29  30  32 34* 33* 35*  GLUCOSE 168*  168* 163*  166* 170* 104* 102* 177*  BUN 27*  28* 35*  35* 39* 44* 44* 47*  CREATININE 1.31*  1.26* 1.62*  1.52* 1.66* 1.78* 1.87* 1.80*  CALCIUM 8.5*  8.4* 8.2*  8.1* 8.0* 8.1* 8.3* 8.3*  PHOS 3.6 3.9 4.0  --  4.1  --    GFR: Estimated Creatinine Clearance: 39.8 mL/min (by C-G formula based on SCr of 1.8 mg/dL (H)). Liver Function Tests:  Recent Labs Lab 09/04/16 0627 09/05/16 0558 09/06/16 0623 09/07/16 0544  ALBUMIN 2.1* 1.9* 1.9* 1.9*   No results for input(s): LIPASE, AMYLASE in the last 168 hours. No results for input(s): AMMONIA in the last 168 hours. Coagulation Profile: No results  for input(s): INR, PROTIME in the last 168 hours. Cardiac Enzymes:  Recent Labs Lab 09/08/16 1209  TROPONINI 0.04*   BNP (last 3 results) No results for input(s): PROBNP in the last 8760 hours. HbA1C: No results for input(s): HGBA1C in the last 72 hours. CBG:  Recent Labs Lab 09/07/16 1132 09/07/16 1648 09/07/16 2201 09/08/16 0714 09/08/16 1133  GLUCAP 204* 300* 156* 151* 167*   Lipid Profile: No results for input(s): CHOL, HDL, LDLCALC, TRIG, CHOLHDL, LDLDIRECT in the last 72 hours. Thyroid Function Tests: No results for input(s): TSH, T4TOTAL, FREET4, T3FREE, THYROIDAB in the last 72 hours. Anemia Panel: No results for input(s): VITAMINB12, FOLATE, FERRITIN, TIBC, IRON, RETICCTPCT in the last 72 hours. Sepsis Labs: No results for input(s): PROCALCITON, LATICACIDVEN in the last 168 hours.  Recent Results (from the past 240 hour(s))  Culture, respiratory (NON-Expectorated)     Status: None   Collection Time: 09/03/16  7:50 PM  Result Value Ref Range Status   Specimen Description SPUTUM  Final   Special Requests NONE  Final   Gram Stain   Final    ABUNDANT WBC PRESENT,BOTH PMN AND MONONUCLEAR ABUNDANT GRAM POSITIVE COCCI IN PAIRS MODERATE GRAM POSITIVE RODS    Culture   Final    ABUNDANT DIPHTHEROIDS(CORYNEBACTERIUM SPECIES) Standardized susceptibility testing for this organism is not available. Performed at [facility]    Report Status 09/07/2016 FINAL  Final         Radiology Studies: Dg Chest Port 1 View  Result Date: 09/07/2016 CLINICAL DATA:  Congestive heart failure EXAM: PORTABLE CHEST 1 VIEW COMPARISON:  09/02/2016 FINDINGS: Cardiomegaly again noted. Status post CABG. Slight worsening in aeration. Central vascular congestion and mild interstitial prominence bilaterally suspicious for pulmonary edema. Small right pleural effusion with right basilar atelectasis or infiltrate. Extensive degenerative changes bilateral shoulders. IMPRESSION: Slight  worsening in aeration. Central vascular congestion and mild interstitial prominence bilaterally suspicious for pulmonary edema. Small right pleural effusion with right basilar atelectasis or infiltrate. Extensive degenerative changes bilateral shoulders. Electronically Signed   By: Lahoma Crocker M.D.   On: 09/07/2016 09:17        Scheduled Meds: . amiodarone  200 mg Oral Daily  . aspirin  81 mg Oral q morning - 10a  . atorvastatin  80 mg Oral q1800  . ceFEPime (MAXIPIME) IV  2 g Intravenous Q24H  . chlorhexidine  15 mL Mouth Rinse BID  . dextrose  25 mL Intravenous Once  . docusate  sodium  100 mg Oral BID  . feeding supplement (PRO-STAT SUGAR FREE 64)  30 mL Oral BID  . heparin  5,000 Units Subcutaneous Q8H  . hydroxychloroquine  200 mg Oral Daily  . insulin aspart  0-9 Units Subcutaneous TID WC  . insulin glargine  8 Units Subcutaneous QHS  . ipratropium-albuterol  3 mL Nebulization BID  . lidocaine (PF)  15 mL Intradermal Once  . mouth rinse  15 mL Mouth Rinse q12n4p  . metoprolol succinate  25 mg Oral q morning - 10a  . olopatadine  1 drop Both Eyes BID  . omega-3 acid ethyl esters  2 g Oral BID  . polyethylene glycol  17 g Oral BID  . polyvinyl alcohol  2 drop Both Eyes TID  . predniSONE  7.5 mg Oral Q breakfast  . pregabalin  25 mg Oral QHS  . sodium chloride flush  3 mL Intravenous Q12H  . tamsulosin  0.4 mg Oral QHS  . thiamine  100 mg Oral Daily  . torsemide  40 mg Oral Daily  . vancomycin  1,000 mg Intravenous Q24H  . vitamin C  500 mg Oral Daily  . zinc sulfate  220 mg Oral Daily   Continuous Infusions:    LOS: 5 days    Time spent: 35 minutes    Rexene Alberts, MD [facility] Pager [phone]   If 7PM-7AM, please contact night-coverage [email or web address] Password [facility] 09/08/2016, 3:18 PM

## 2016-09-09 ENCOUNTER — Encounter (HOSPITAL_COMMUNITY): Payer: Self-pay | Admitting: Internal Medicine

## 2016-09-09 DIAGNOSIS — E11622 Type 2 diabetes mellitus with other skin ulcer: Secondary | ICD-10-CM | POA: Diagnosis not present

## 2016-09-09 DIAGNOSIS — M069 Rheumatoid arthritis, unspecified: Secondary | ICD-10-CM | POA: Diagnosis not present

## 2016-09-09 DIAGNOSIS — D539 Nutritional anemia, unspecified: Secondary | ICD-10-CM | POA: Diagnosis not present

## 2016-09-09 DIAGNOSIS — R1312 Dysphagia, oropharyngeal phase: Secondary | ICD-10-CM | POA: Diagnosis not present

## 2016-09-09 DIAGNOSIS — L8990 Pressure ulcer of unspecified site, unspecified stage: Secondary | ICD-10-CM | POA: Diagnosis not present

## 2016-09-09 DIAGNOSIS — Z89512 Acquired absence of left leg below knee: Secondary | ICD-10-CM | POA: Diagnosis not present

## 2016-09-09 DIAGNOSIS — Z794 Long term (current) use of insulin: Secondary | ICD-10-CM | POA: Diagnosis not present

## 2016-09-09 DIAGNOSIS — R262 Difficulty in walking, not elsewhere classified: Secondary | ICD-10-CM | POA: Diagnosis not present

## 2016-09-09 DIAGNOSIS — S88112D Complete traumatic amputation at level between knee and ankle, left lower leg, subsequent encounter: Secondary | ICD-10-CM | POA: Diagnosis not present

## 2016-09-09 DIAGNOSIS — R293 Abnormal posture: Secondary | ICD-10-CM | POA: Diagnosis not present

## 2016-09-09 DIAGNOSIS — N183 Chronic kidney disease, stage 3 (moderate): Secondary | ICD-10-CM | POA: Diagnosis not present

## 2016-09-09 DIAGNOSIS — L89150 Pressure ulcer of sacral region, unstageable: Secondary | ICD-10-CM | POA: Diagnosis not present

## 2016-09-09 DIAGNOSIS — N186 End stage renal disease: Secondary | ICD-10-CM | POA: Diagnosis not present

## 2016-09-09 DIAGNOSIS — Z5189 Encounter for other specified aftercare: Secondary | ICD-10-CM | POA: Diagnosis not present

## 2016-09-09 DIAGNOSIS — E1121 Type 2 diabetes mellitus with diabetic nephropathy: Secondary | ICD-10-CM | POA: Diagnosis not present

## 2016-09-09 DIAGNOSIS — G8929 Other chronic pain: Secondary | ICD-10-CM | POA: Diagnosis not present

## 2016-09-09 DIAGNOSIS — J189 Pneumonia, unspecified organism: Secondary | ICD-10-CM | POA: Diagnosis not present

## 2016-09-09 DIAGNOSIS — I5022 Chronic systolic (congestive) heart failure: Secondary | ICD-10-CM | POA: Diagnosis not present

## 2016-09-09 DIAGNOSIS — R634 Abnormal weight loss: Secondary | ICD-10-CM | POA: Diagnosis not present

## 2016-09-09 DIAGNOSIS — N179 Acute kidney failure, unspecified: Secondary | ICD-10-CM | POA: Diagnosis not present

## 2016-09-09 DIAGNOSIS — G729 Myopathy, unspecified: Secondary | ICD-10-CM | POA: Diagnosis not present

## 2016-09-09 DIAGNOSIS — E875 Hyperkalemia: Secondary | ICD-10-CM | POA: Diagnosis not present

## 2016-09-09 DIAGNOSIS — M0609 Rheumatoid arthritis without rheumatoid factor, multiple sites: Secondary | ICD-10-CM | POA: Diagnosis not present

## 2016-09-09 DIAGNOSIS — T7840XD Allergy, unspecified, subsequent encounter: Secondary | ICD-10-CM | POA: Diagnosis not present

## 2016-09-09 DIAGNOSIS — J9691 Respiratory failure, unspecified with hypoxia: Secondary | ICD-10-CM | POA: Diagnosis not present

## 2016-09-09 DIAGNOSIS — D649 Anemia, unspecified: Secondary | ICD-10-CM | POA: Diagnosis not present

## 2016-09-09 DIAGNOSIS — Z4781 Encounter for orthopedic aftercare following surgical amputation: Secondary | ICD-10-CM | POA: Diagnosis not present

## 2016-09-09 DIAGNOSIS — N250 Renal osteodystrophy: Secondary | ICD-10-CM | POA: Diagnosis not present

## 2016-09-09 DIAGNOSIS — M24542 Contracture, left hand: Secondary | ICD-10-CM | POA: Diagnosis not present

## 2016-09-09 DIAGNOSIS — R279 Unspecified lack of coordination: Secondary | ICD-10-CM | POA: Diagnosis not present

## 2016-09-09 DIAGNOSIS — L89152 Pressure ulcer of sacral region, stage 2: Secondary | ICD-10-CM | POA: Diagnosis not present

## 2016-09-09 DIAGNOSIS — G4709 Other insomnia: Secondary | ICD-10-CM | POA: Diagnosis not present

## 2016-09-09 DIAGNOSIS — E119 Type 2 diabetes mellitus without complications: Secondary | ICD-10-CM | POA: Diagnosis not present

## 2016-09-09 DIAGNOSIS — I2510 Atherosclerotic heart disease of native coronary artery without angina pectoris: Secondary | ICD-10-CM | POA: Diagnosis not present

## 2016-09-09 DIAGNOSIS — J90 Pleural effusion, not elsewhere classified: Secondary | ICD-10-CM | POA: Diagnosis present

## 2016-09-09 DIAGNOSIS — D638 Anemia in other chronic diseases classified elsewhere: Secondary | ICD-10-CM | POA: Diagnosis not present

## 2016-09-09 DIAGNOSIS — E785 Hyperlipidemia, unspecified: Secondary | ICD-10-CM | POA: Diagnosis not present

## 2016-09-09 DIAGNOSIS — M24541 Contracture, right hand: Secondary | ICD-10-CM | POA: Diagnosis not present

## 2016-09-09 DIAGNOSIS — S81802A Unspecified open wound, left lower leg, initial encounter: Secondary | ICD-10-CM | POA: Diagnosis not present

## 2016-09-09 DIAGNOSIS — M6281 Muscle weakness (generalized): Secondary | ICD-10-CM | POA: Diagnosis not present

## 2016-09-09 DIAGNOSIS — L97112 Non-pressure chronic ulcer of right thigh with fat layer exposed: Secondary | ICD-10-CM | POA: Diagnosis not present

## 2016-09-09 DIAGNOSIS — I5043 Acute on chronic combined systolic (congestive) and diastolic (congestive) heart failure: Secondary | ICD-10-CM | POA: Diagnosis not present

## 2016-09-09 DIAGNOSIS — N401 Enlarged prostate with lower urinary tract symptoms: Secondary | ICD-10-CM | POA: Diagnosis not present

## 2016-09-09 DIAGNOSIS — E871 Hypo-osmolality and hyponatremia: Secondary | ICD-10-CM | POA: Diagnosis not present

## 2016-09-09 DIAGNOSIS — T8131XA Disruption of external operation (surgical) wound, not elsewhere classified, initial encounter: Secondary | ICD-10-CM | POA: Diagnosis not present

## 2016-09-09 DIAGNOSIS — I509 Heart failure, unspecified: Secondary | ICD-10-CM | POA: Diagnosis not present

## 2016-09-09 HISTORY — DX: Pleural effusion, not elsewhere classified: J90

## 2016-09-09 LAB — RENAL FUNCTION PANEL
ALBUMIN: 1.9 g/dL — AB (ref 3.5–5.0)
ANION GAP: 9 (ref 5–15)
BUN: 54 mg/dL — ABNORMAL HIGH (ref 6–20)
CALCIUM: 8.2 mg/dL — AB (ref 8.9–10.3)
CHLORIDE: 89 mmol/L — AB (ref 101–111)
CO2: 36 mmol/L — AB (ref 22–32)
Creatinine, Ser: 1.97 mg/dL — ABNORMAL HIGH (ref 0.61–1.24)
GFR calc non Af Amer: 33 mL/min — ABNORMAL LOW (ref >60)
GFR, EST AFRICAN AMERICAN: 39 mL/min — AB (ref >60)
Glucose, Bld: 133 mg/dL — ABNORMAL HIGH (ref 65–99)
POTASSIUM: 3.9 mmol/L (ref 3.5–5.1)
Phosphorus: 4.4 mg/dL (ref 2.5–4.6)
SODIUM: 134 mmol/L — AB (ref 135–145)

## 2016-09-09 LAB — GLUCOSE, CAPILLARY
GLUCOSE-CAPILLARY: 121 mg/dL — AB (ref 65–99)
GLUCOSE-CAPILLARY: 166 mg/dL — AB (ref 65–99)

## 2016-09-09 LAB — TROPONIN I
TROPONIN I: 0.05 ng/mL — AB (ref <0.03)
Troponin I: 0.05 ng/mL (ref <0.03)

## 2016-09-09 LAB — VANCOMYCIN, TROUGH: VANCOMYCIN TR: 42 ug/mL — AB (ref 15–20)

## 2016-09-09 LAB — MAGNESIUM: MAGNESIUM: 1.7 mg/dL (ref 1.7–2.4)

## 2016-09-09 MED ORDER — AMOXICILLIN 250 MG PO CAPS: 250.0000 mg | ORAL_CAPSULE | Freq: Two times a day (BID) | ORAL | Status: DC

## 2016-09-09 MED ORDER — ALPRAZOLAM 0.25 MG PO TABS: 0.2500 mg | ORAL_TABLET | Freq: Three times a day (TID) | ORAL | 0 refills | Status: DC | PRN

## 2016-09-09 MED ORDER — TORSEMIDE 20 MG PO TABS: 20.0000 mg | ORAL_TABLET | Freq: Every day | ORAL | Status: DC

## 2016-09-09 MED ORDER — POTASSIUM CHLORIDE ER 10 MEQ PO TBCR: 10.0000 meq | EXTENDED_RELEASE_TABLET | Freq: Every day | ORAL | Status: DC

## 2016-09-09 NOTE — Progress Notes (Signed)
Pharmacy Antibiotic Note  [name] is a 65 y.o. male admitted on 09/02/2016 with pneumonia.  Pharmacy has been consulted for [name] AND CEFEPIME dosing.  Vanc trough today is elevated at 42 mcg/ml  Plan: Hold vanc. Plan to dc today but if not will check random vanc level when appropriate Cefepime 2gm IV q24h Monitor labs, renal fxn, progress and c/s F/U duration of therapy, deescalate ABX when appropriate  Height: 5\' 9"  (175.3 cm) Weight: 180 lb 12.4 oz (82 kg) IBW/kg (Calculated) : 70.7  Temp (24hrs), Avg:98.4 F (36.9 C), Min:98.2 F (36.8 C), Max:98.5 F (36.9 C)   Recent Labs Lab 09/02/16 1559  09/03/16 1500 09/04/16 0627 09/05/16 0558 09/06/16 0623 09/07/16 0541 09/07/16 0544 09/08/16 0546 09/09/16 0656 09/09/16 1113  WBC 10.1  --  9.2 8.5 8.2  --   --   --  10.1  --   --   CREATININE 2.11*  < >  --  1.31*  1.26* 1.62*  1.52* 1.66* 1.78* 1.87* 1.80* 1.97*  --   VANCOTROUGH  --   --   --   --   --   --   --   --   --   --  42*  < > = values in this interval not displayed.  Estimated Creatinine Clearance: 36.4 mL/min (by C-G formula based on SCr of 1.97 mg/dL (H)).    Allergies  Allergen Reactions  . Sulfa Antibiotics Rash   Antimicrobials this admission: Vancomycin 9/8 >>  Cefepime 9/8 >>   Dose adjustments this admission: 9/10:  Vanc and Cefepime changed to dialysis dosing as pt was started on scheduled dialysis sessions:  MWF >> then on 9/11 changed back to q24h dosing due to dialysis stopped and improved renal fxn. 9/15- vanc held due to elevated trough  Recent Results (from the past 240 hour(s))  Culture, respiratory (NON-Expectorated)     Status: None   Collection Time: 09/03/16  7:50 PM  Result Value Ref Range Status   Specimen Description SPUTUM  Final   Special Requests NONE  Final   Gram Stain   Final    ABUNDANT WBC PRESENT,BOTH PMN AND MONONUCLEAR ABUNDANT GRAM POSITIVE COCCI IN PAIRS MODERATE GRAM POSITIVE RODS    Culture   Final     ABUNDANT DIPHTHEROIDS(CORYNEBACTERIUM SPECIES) Standardized susceptibility testing for this organism is not available. Performed at [facility]    Report Status 09/07/2016 FINAL  Final   Thank you for allowing pharmacy to be a part of this patient's care.  Excell Seltzer [place] 09/09/2016 12:21 PM

## 2016-09-09 NOTE — Discharge Summary (Signed)
Physician Discharge Summary  [name] LPF:[id number] DOB: 10-13-1951 DOA: 09/02/2016  PCP: Lala Lund, MD  Admit date: 09/02/2016 Discharge date: 09/09/2016  Time spent: Greater than 30 minutes  Recommendations for Outpatient Follow-up:  1. Recommend follow-up CT in 3 months to assess resolution of small loculated pleural effusions.  2. Recommend monitoring of the patient's heart rate off of amiodarone. 3. Patient will need to follow-up with Dr. Lowanda Foster as scheduled. 4. Recommend continued wound care at the SNF. 5. Patient was discharged to [facility].   Discharge Diagnoses:  Principal Problem:   HCAP (healthcare-associated pneumonia) Active Problems:   Rheumatoid arthritis ([place])   Prolonged QT interval   Shortness of breath   Hypertension   Insulin dependent diabetes mellitus (HCC)   Seizure (HCC)   Anemia of chronic disease   Acute on chronic combined systolic and diastolic CHF (congestive heart failure) (HCC)   Acute renal failure superimposed on stage 3 chronic kidney disease (HCC)   Hyperkalemia   Sacral decubitus ulcer   Loculated pleural effusion   Discharge Condition: Improved, but chronically debilitated.  Diet recommendation: Heart healthy-dysphagia 3 with thin liquids.  Filed Weights   09/06/16 0656 09/07/16 0503 09/09/16 0500  Weight: 85.1 kg (187 lb 9.8 oz) 83.8 kg (184 lb 11.9 oz) 82 kg (180 lb 12.4 oz)    History of present illness:  Patient is a 65 year old man with a history of combined CHF, chronic kidney disease necessitating hemodialysis on a number of occasions, chronic hypotension, diabetes mellitus, steroid dependent rheumatoid arthritis, and a recent long hospitalization at [facility] for complications from peripheral vascular disease-status post left BKA. He was also recently discharged from [facility] on 08/30/16 for fluid overload from CKD. He presented again on 09/02/16 from [facility] with shortness of breath and cough. In the ED, he was  afebrile and hemodynamically stable. His chest x-ray revealed bilateral pulmonary infiltrates suggestive of pulmonary edema, but with bibasilar pneumonia not excluded and a right pleural effusion. His lab data were significant for creatinine of 2.11, sodium of 129, potassium of 5.8, and glucose of 227. His hemoglobin was 9.8. He was admitted for further evaluation and management of HCAP plus/minus acute on chronic CHF and AKI.   Hospital Course:    HCAP. The patient was started on treatment with cefepime and vancomycin. He was given Solu-Medrol empirically in the ED. Supportive treatment was given. Subsequent studies ordered and revealed a negative strep pneumo antigen, negative Legionella antigen, and negative HIV. Sputum culture was still pending. Patient improved clinically and symptomatically. He has remained afebrile. -Incentive spirometry and out of bed to the chair to increase aeration was ordered. -He received 8 days of IV antibiotics and will be discharged on 3 more days of amoxicillin.  Small loculated pleural effusions. Patient complained of chest pain and shortness of breath prior to anticipated hospital discharge. -VQ scan was ordered and revealed no PE. -Noncontrasted chest CT was ordered and revealed right greater than left pleural effusions and loculated pleural effusions along the fissures. I discussed the findings with radiologist, Dr. Darcella Cheshire. Per his assessment, these were small effusions that were not readily amenable to drainage, but he could try. However, he did not believe these small loculated effusions were sufficient enough to be therapeutically beneficial. The patient had remained afebrile and his white blood cell count was within normal limits. Therefore, the management monitoring and continued antibiotics for 3 more days. -Recommend follow-up CT in 3 months to reassess.  Acute on  chronic systolic and diastolic congestive heart failure. Patient's BNP was greater than  4500 on admission. His chest x-ray revealed bilateral infiltrates which  could've been from a combination of pneumonia and pulmonary edema. 100 mg of IV Lasix was given in the ED. -2-D echocardiogram ordered and revealed an EF of 30-35% and grade 2 diastolic dysfunction. -Demadex was added by nephrology and continued at the time of discharge. -The patient underwent HD on 09/03/16.  Acute kidney injury superimposed on stage III chronic kidney disease. Patient's baseline creatinine ranged from 1.28-1.89. He has a history of hemodialysis during his hospitalization at [facility] for AK I. On admission, his creatinine was 2.11. He was given IV Lasix in the ED, but then subsequently started on IV fluids. Nephrology was consulted and discontinued IV Lasix and started oral Demadex. He was dialyzed once, pulling off 2.6 L. -Per nephrology, hemodialysis was no longer needed. The temporary HD catheter was discontinued on 09/06/16. -His creatinine improved to 1.8, but trended back up to 1.97. -Nephrology decrease the dose of torsemide from 40 mg daily to 20 mg daily. -Patient is scheduled to follow-up with Dr. Lowanda Foster on 09/21/16.  Chest pain and shortness of breath. When the patient was informed that he would be discharged on 09/08/16, he complained of chest pain and shortness of breath. Troponin I was ordered was minimally elevated at 0.04-0.05, not consistent with ACS.  EKG was ordered and revealed prolonged QT interval, PACs, but no worrisome ST or T-wave abnormalities. VQ scan was negative for PE. CT of the chest results noted above. The patient continued to oxygenate 98-100 percent on 1-2 L of nasal cannula oxygen. -It is likely he is chronically short of breath due to debilitation and chronic lung changes. -Added as needed Xanax for possible anxiety. -He was asymptomatic at the time of discharge.  History of PSVT. Patient is on amiodarone, but the reason by his unclear to the patient and family.  Therefore, in review of his chart, it was noted that he had a history of PSVT. In light of the patient's chest x-ray findings and prolonged QT interval, amiodarone was discontinued. Toprol-XL was increased to 37.5 mg daily.  Prolonged QT interval. The patient has a noted history of prolonged QT interval, dating back at least a couple of years. It was presumed to be secondary to methadone and amitriptyline. Methadone was recently discontinued during a previous hospitalization. His magnesium level was on the high side of normal less than 2 weeks ago and was within normal limits at 1.7 during this hospitalization. -As above, will hold amiodarone and follow.  Hyperkalemia. Patient's potassium on admission was 5.8. It subsequently increased to 6.1. He was given IV Lasix and Kayexalate with no significant improvement. Following hemodialysis, his serum potassium normalized.  Acute encephalopathy; query seizure Apparently, the patient became confused and subsequently had an episode on 09/04/16 lasting 5 minutes with his eyes rolled back and difficulty speaking followed by 10 minutes where he had difficulty recognizing his family members. It was unclear if this was a partial complex seizure. -Initially, trazodone and Ativan were discontinued as it was felt that his initial confusion was secondary to medications and his acute infection. -Neurologist, Dr. Merlene Laughter was consulted. Per Dr. Merlene Laughter, the spell of confusion and unresponsiveness etiology was unclear but could've been secondary to medication side effect or confusional arousal from sleep. Partial complex seizure was a possibility, but the EEG revealed no epileptiform activity. -Patient was continued on Lyrica with no other management changes.  Hypertension. Patient is treated chronically with metoprolol XL. His blood pressure remained controlled and stable.  Insulin-dependent diabetes mellitus. Patient is treated chronically with NPH insulin. It  was withheld on admission in favor of sliding scale NovoLog and Lantus. His CBGs were fairly controlled.  Rheumatoid arthritis. The patient has severe rheumatoid arthritis and is treated with chronic prednisone and Plaquenil. He was given IV Solu-Medrol 1 in the ED. he was restarted on prednisone and Plaquenil. -Remained stable.  Stage II sacral decubitus ulcer. Noted by nursing. Wound care nurse consulted. Recommendations for dressing changes noted and appreciated. -Recommend continued wound care at the SNF.  Dysphagia. ST evaluated the patient and recommended a dysphagia 3 with thin liquid diet.    Procedures: -Removal of 2 per HD catheter by general surgery on 09/06/16 -EEG 09/05/16 -Hemodialysis 09/03/16 -2-D echo 09/03/16:  Left ventricle: The cavity size was normal. Wall thickness was normal. Systolic function was moderately to severely reduced. The estimated ejection fraction was in the range of 30% to 35%. Features are consistent with a pseudonormal left ventricular filling pattern, with concomitant abnormal relaxation and increased filling pressure (grade 2 diastolic dysfunction). Doppler parameters are consistent with high ventricular filling pressure. - Regional wall motion abnormality: Severe hypokinesis of the mid-apical anterior and mid anteroseptal myocardium. - Aortic valve: Moderately to severely calcified annulus. Mildly thickened, moderately calcified leaflets. There was mild to moderate stenosis. Peak velocity (S): 233 cm/s. Mean gradient (S): 12 mm Hg. Valve area (VTI): 1.23 cm^2. Valve area (Vmax): 1.31 cm^2. Valve area (Vmean): 1.19 cm^2. - Mitral valve: Mildly calcified annulus. Mildly calcified leaflets . There was moderate regurgitation. - Left atrium: The atrium was mildly dilated. - Right ventricle: Systolic function was moderately reduced. - Tricuspid valve: There was mild  regurgitation.  Consultations:  Nephrology  Gen. surgery to remove HD catheter  Neurology  Discharge Exam: Vitals:   09/08/16 2035 09/09/16 0623  BP: (!) 114/58 (!) 118/50  Pulse: 91 84  Resp: 18 17  Temp: 98.5 F (36.9 C) 98.2 F (36.8 C)  Oxygen saturation 98% on 2 L nasal cannula oxygen. General exam: Anxious but he does not appear to be in respiratory distress.  Respiratory system: Lungs are clear anteriorly, but with a few mid lobe to lower lobe crackles; decreased breath sounds in the bases. Cardiovascular system: S1 & S2 with a soft systolic murmur. No JVD.. Trace-1+ pitting edema of right lower extremity. Gastrointestinal system: Abdomen is nondistended, soft and nontender. No organomegaly or masses felt. Normal bowel sounds heard. Central nervous system: Anxious. No focal neurological deficits. Extremities:Severe rheumatoid arthritic changes with hypertrophic DIP/PIP and MCP joints with no erythema or joint swelling. Left lower extremity BKA noted. Skin: Small eschar on the pretibial surface of the left stump; no drainage.    Discharge Instructions   Discharge Instructions    Diet - low sodium heart healthy    Complete by:  As directed    Increase activity slowly    Complete by:  As directed      Current Discharge Medication List    START taking these medications   Details  ALPRAZolam (XANAX) 0.25 MG tablet Take 1 tablet (0.25 mg total) by mouth 3 (three) times daily as needed for anxiety. Qty: 30 tablet, Refills: 0    amoxicillin (AMOXIL) 250 MG capsule Take 1 capsule (250 mg total) by mouth 2 (two) times daily. Take antibiotic for 3 more days.    levalbuterol (XOPENEX) 0.63 MG/3ML nebulizer solution Take 3 mLs (0.63  mg total) by nebulization every 4 (four) hours as needed for wheezing or shortness of breath.    torsemide (DEMADEX) 20 MG tablet Take 1 tablet (20 mg total) by mouth daily.      CONTINUE these medications which have CHANGED   Details   metoprolol succinate (TOPROL-XL) 25 MG 24 hr tablet Take 1.5 tablets (37.5 mg total) by mouth every morning.      CONTINUE these medications which have NOT CHANGED   Details  acetaminophen (TYLENOL) 325 MG tablet Take 650 mg by mouth every 6 (six) hours as needed for mild pain or moderate pain.     Amino Acids-Protein Hydrolys (PRO-STAT 64 PO) Take 30 mLs by mouth 2 (two) times daily.    Artificial Tear Ointment (REFRESH P.M. OP) Apply 1 drop to eye daily as needed (FOR DY EYES). ONE APPLICATION BOTH EYES. For dry eyes not relieved by theratears every 6 hours     aspirin 81 MG chewable tablet Chew 81 mg by mouth every morning.     atorvastatin (LIPITOR) 80 MG tablet Take 80 mg by mouth daily.    Carboxymethylcellulose Sodium (THERATEARS) 0.25 % SOLN Apply 2 drops to both eyes three times a day    docusate sodium (COLACE) 100 MG capsule Take 100 mg by mouth 2 (two) times daily.    hydroxychloroquine (PLAQUENIL) 200 MG tablet Take 200 mg by mouth daily.     insulin NPH Human (HUMULIN N) 100 UNIT/ML injection Give 5 units subcutaneous twice a day    Melatonin 3 MG TABS Take 2 tablets by mouth at bedtime    Multiple Vitamins-Minerals (THERA-M ENHANCED PO) 1 tablet by mouth daily    nystatin cream (MYCOSTATIN) Apply 1 application topically daily. Mix with zinc oxide and apply to sacrum daily & as needed    olopatadine (PATANOL) 0.1 % ophthalmic solution Place 1 drop into both eyes 2 (two) times daily.    omega-3 acid ethyl esters (LOVAZA) 1 g capsule Take 2 g by mouth 2 (two) times daily.    oxyCODONE (OXY IR/ROXICODONE) 5 MG immediate release tablet Take 1 tablet (5 mg total) by mouth every 12 (twelve) hours as needed for severe pain. Qty: 60 tablet, Refills: 0    OXYGEN Inhale 2 L into the lungs continuous. O 2 at 2 LPM via n/c to keep O2 sat above 90 % every shift     polyethylene glycol powder (MIRALAX) powder Take 17 g by mouth 2 (two) times daily.    predniSONE (DELTASONE)  2.5 MG tablet Take 3 tablets (7.5 mg total) by mouth daily with breakfast. Qty: 30 tablet, Refills: 1    pregabalin (LYRICA) 25 MG capsule Take one capsule by mouth at bedtime Qty: 30 capsule, Refills: 0    Tamsulosin HCl (FLOMAX) 0.4 MG CAPS Take 0.4 mg by mouth at bedtime.     thiamine (VITAMIN B-1) 100 MG tablet Take 100 mg by mouth daily.    traZODone (DESYREL) 50 MG tablet Take 50 mg by mouth at bedtime.    vitamin C (ASCORBIC ACID) 500 MG tablet Take 500 mg by mouth daily.    Zinc Oxide 10 % OINT Mix with Nystatin and apply to sacrum daily & prn as ordered once a day    zinc sulfate 220 (50 Zn) MG capsule Take 220 mg by mouth daily.      STOP taking these medications     albuterol (PROVENTIL) (2.5 MG/3ML) 0.083% nebulizer solution      amiodarone (PACERONE)  200 MG tablet        Allergies  Allergen Reactions  . Sulfa Antibiotics Rash   Follow-up Information    [facility], MD On 09/21/2016.   Specialty:  Nephrology Why:  Appointment with Dr. Lowanda Foster Sept. 27th at 2:00pm Contact information: [address] [place] [address] [phone]        Vickie Epley, MD.   Specialty:  General Surgery Why:  Surgical follow-up only as needed. Please call if any questions. Contact information: [place] [facility] [address] [phone]            The results of significant diagnostics from this hospitalization (including imaging, microbiology, ancillary and laboratory) are listed below for reference.    Significant Diagnostic Studies: Ct Chest Wo Contrast  Result Date: 09/08/2016 CLINICAL DATA:  Short of breath, congestive heart failure. EXAM: CT CHEST WITHOUT CONTRAST TECHNIQUE: Multidetector CT imaging of the chest was performed following the standard protocol without IV contrast. COMPARISON:  CT scan 09/08/2016, 09/07/2016, chest radiograph FINDINGS: Cardiovascular: Coronary artery calcification and aortic atherosclerotic  calcification. Midline sternotomy and CABG anatomy. No pericardial fluid Mediastinum/Nodes: No axillary or supraclavicular adenopathy. No mediastinal hilar adenopathy. No pericardial fluid. Lungs/Pleura: There is a moderate RIGHT effusion. There is atelectasis with air bronchograms in the RIGHT lower lobe. Likely loculated pleural fluid along the RIGHT oblique fissure. Loculated fluid along the LEFT fissure additionally and small LEFT effusion. Upper lungs are relatively clear. No evidence of overt pulmonary edema. Upper Abdomen: Limited view of the liver, kidneys, pancreas are unremarkable. Normal adrenal glands. Gallstones noted in the gallbladder Musculoskeletal: No aggressive osseous lesion IMPRESSION: 1. Bilateral pleural effusions RIGHT greater than LEFT. Loculated fluid along the LEFT and RIGHT oblique fissures. 2. RIGHT lower lobe atelectasis versus infiltrate. Favor atelectasis. 3. No evidence of overt pulmonary edema. 4. Cardiomegaly Electronically Signed   By: Suzy Bouchard M.D.   On: 09/08/2016 20:20   Nm Pulmonary Perf And Vent  Result Date: 09/08/2016 CLINICAL DATA:  Short of breath, concern for pulmonary embolism. EXAM: NUCLEAR MEDICINE VENTILATION - PERFUSION LUNG SCAN TECHNIQUE: Ventilation images were obtained in multiple projections using inhaled aerosol Tc-38m DTPA. Perfusion images were obtained in multiple projections after intravenous injection of Tc-42m MAA. RADIOPHARMACEUTICALS:  Thirty-one mCi Technetium-90m DTPA aerosol inhalation and 3.9 mCi Technetium-68m MAA IV COMPARISON:  Radiograph 09/07/2016 FINDINGS: Ventilation: Decreased ventilation to the RIGHT lung base corresponds elevation of hemidiaphragm and effusion. Perfusion: No wedge shaped peripheral perfusion defects to suggest acute pulmonary embolism. Decreased perfusion to the RIGHT lung base corresponds elevated hemidiaphragm. IMPRESSION: 1. No evidence acute pulmonary embolism. 2. Decreased ventilation and profusion to the  RIGHT lower lobe corresponds elevated hemidiaphragm and RIGHT effusion seen on comparison radiograph. Electronically Signed   By: Suzy Bouchard M.D.   On: 09/08/2016 17:43   Dg Chest Port 1 View  Result Date: 09/07/2016 CLINICAL DATA:  Congestive heart failure EXAM: PORTABLE CHEST 1 VIEW COMPARISON:  09/02/2016 FINDINGS: Cardiomegaly again noted. Status post CABG. Slight worsening in aeration. Central vascular congestion and mild interstitial prominence bilaterally suspicious for pulmonary edema. Small right pleural effusion with right basilar atelectasis or infiltrate. Extensive degenerative changes bilateral shoulders. IMPRESSION: Slight worsening in aeration. Central vascular congestion and mild interstitial prominence bilaterally suspicious for pulmonary edema. Small right pleural effusion with right basilar atelectasis or infiltrate. Extensive degenerative changes bilateral shoulders. Electronically Signed   By: Lahoma Crocker M.D.   On: 09/07/2016 09:17   Dg Chest Portable 1 View  Result Date: 09/02/2016 CLINICAL DATA:  Cough. EXAM: PORTABLE CHEST 1 VIEW COMPARISON:  08/26/2016. FINDINGS: Dialysis catheter in stable position. Prior CABG. Cardiomegaly with diffuse bilateral pulmonary infiltrates again noted. Right pleural effusion. Findings consistent congestive heart failure. Similar findings noted on prior exam. IMPRESSION: 1. Dialysis catheter in stable position. 2. Prior CABG. Cardiomegaly with bilateral pulmonary infiltrates consistent with pulmonary edema. Right pleural effusion. Findings consistent with congestive heart failure. Bilateral pneumonia cannot be excluded. Electronically Signed   By: Marcello Moores  Register   On: 09/02/2016 17:01   Dg Chest Port 1 View  Result Date: 08/26/2016 CLINICAL DATA:  Nonproductive cough, chest pressure and worsening dyspnea for 4 days EXAM: PORTABLE CHEST 1 VIEW COMPARISON:  04/04/2016 FINDINGS: There are pleural effusions bilaterally, right greater than left.  There is hazy central airspace opacity which may represent alveolar edema. Mild pulmonary vascular prominence. Mild cardiomegaly. There is a dual-lumen left-sided central line extending into the right atrium. Incidental findings include severe shoulder arthropathy bilaterally. IMPRESSION: Congestive heart failure with alveolar edema and/or right pleural effusion. Electronically Signed   By: Andreas Newport M.D.   On: 08/26/2016 05:25    Microbiology: Recent Results (from the past 240 hour(s))  Culture, respiratory (NON-Expectorated)     Status: None   Collection Time: 09/03/16  7:50 PM  Result Value Ref Range Status   Specimen Description SPUTUM  Final   Special Requests NONE  Final   Gram Stain   Final    ABUNDANT WBC PRESENT,BOTH PMN AND MONONUCLEAR ABUNDANT GRAM POSITIVE COCCI IN PAIRS MODERATE GRAM POSITIVE RODS    Culture   Final    ABUNDANT DIPHTHEROIDS(CORYNEBACTERIUM SPECIES) Standardized susceptibility testing for this organism is not available. Performed at [facility]    Report Status 09/07/2016 FINAL  Final     Labs: Basic Metabolic Panel:  Recent Labs Lab 09/04/16 0627 09/05/16 0558 09/06/16 1607 09/07/16 0541 09/07/16 0544 09/08/16 0546 09/09/16 0656  NA 136  135 133*  133* 133* 134* 134* 134* 134*  K 3.8  3.8 3.9  3.8 3.8 4.0 3.9 3.7 3.9  CL 96*  96* 94*  95* 96* 92* 90* 91* 89*  CO2 31  31 29  30  32 34* 33* 35* 36*  GLUCOSE 168*  168* 163*  166* 170* 104* 102* 177* 133*  BUN 27*  28* 35*  35* 39* 44* 44* 47* 54*  CREATININE 1.31*  1.26* 1.62*  1.52* 1.66* 1.78* 1.87* 1.80* 1.97*  CALCIUM 8.5*  8.4* 8.2*  8.1* 8.0* 8.1* 8.3* 8.3* 8.2*  MG  --   --   --   --   --   --  1.7  PHOS 3.6 3.9 4.0  --  4.1  --  4.4   Liver Function Tests:  Recent Labs Lab 09/04/16 0627 09/05/16 0558 09/06/16 0623 09/07/16 0544 09/09/16 0656  ALBUMIN 2.1* 1.9* 1.9* 1.9* 1.9*   No results for input(s): LIPASE, AMYLASE in the last 168 hours. No  results for input(s): AMMONIA in the last 168 hours. CBC:  Recent Labs Lab 09/02/16 1559 09/03/16 1500 09/04/16 0627 09/05/16 0558 09/08/16 0546  WBC 10.1 9.2 8.5 8.2 10.1  NEUTROABS 8.6*  --   --   --   --   HGB 9.8* 8.9* 9.2* 9.2* 9.4*  HCT 30.5* 27.8* 29.3* 28.9* 28.7*  MCV 96.2 96.2 99.0 98.0 96.0  PLT 248 263 224 216 255   Cardiac Enzymes:  Recent Labs Lab 09/08/16 1209 09/08/16 1939 09/09/16 0120 09/09/16  0656  TROPONINI 0.04* 0.05* 0.05* 0.05*   BNP: BNP (last 3 results)  Recent Labs  08/26/16 0509 09/02/16 1948  BNP >4,500.0* >4,500.0*    ProBNP (last 3 results) No results for input(s): PROBNP in the last 8760 hours.  CBG:  Recent Labs Lab 09/08/16 1133 09/08/16 1550 09/08/16 2031 09/09/16 0803 09/09/16 1103  GLUCAP 167* 177* 203* 121* 166*       Signed:  Kaizlee Carlino MD.  [facility] 09/09/2016, 11:51 AM

## 2016-09-09 NOTE — Progress Notes (Signed)
Subjective: Interval History: He has some cough last night but feeling better. Presently he denies any difficulty breathing. He doesn't have any other complaints.  Objective: Vital signs in last 24 hours: Temp:  [98.2 F (36.8 C)-98.5 F (36.9 C)] 98.2 F (36.8 C) (09/15 0623) Pulse Rate:  [84-95] 84 (09/15 0623) Resp:  [17-20] 17 (09/15 0623) BP: (114-130)/(50-64) 118/50 (09/15 0623) SpO2:  [99 %-100 %] 100 % (09/15 0623) Weight:  [82 kg (180 lb 12.4 oz)] 82 kg (180 lb 12.4 oz) (09/15 0500) Weight change:   Intake/Output from previous day: 09/14 0701 - 09/15 0700 In: 1120 [P.O.:720; IV Piggyback:400] Out: 2800 [Urine:2800] Intake/Output this shift: No intake/output data recorded.  Generally patient is alert and in no apparent distress. Chest: Decreased breath sound bilaterally but seems to be clear Heart exam regular rate and rhythm Extremities he has trace edema on the right and left BKA Lab Results:  Recent Labs  09/08/16 0546  WBC 10.1  HGB 9.4*  HCT 28.7*  PLT 255   BMET:   Recent Labs  09/07/16 0544 09/08/16 0546  NA 134* 134*  K 3.9 3.7  CL 90* 91*  CO2 33* 35*  GLUCOSE 102* 177*  BUN 44* 47*  CREATININE 1.87* 1.80*  CALCIUM 8.3* 8.3*   No results for input(s): PTH in the last 72 hours. Iron Studies: No results for input(s): IRON, TIBC, TRANSFERRIN, FERRITIN in the last 72 hours.  Studies/Results: Ct Chest Wo Contrast  Result Date: 09/08/2016 CLINICAL DATA:  Short of breath, congestive heart failure. EXAM: CT CHEST WITHOUT CONTRAST TECHNIQUE: Multidetector CT imaging of the chest was performed following the standard protocol without IV contrast. COMPARISON:  CT scan 09/08/2016, 09/07/2016, chest radiograph FINDINGS: Cardiovascular: Coronary artery calcification and aortic atherosclerotic calcification. Midline sternotomy and CABG anatomy. No pericardial fluid Mediastinum/Nodes: No axillary or supraclavicular adenopathy. No mediastinal hilar adenopathy.  No pericardial fluid. Lungs/Pleura: There is a moderate RIGHT effusion. There is atelectasis with air bronchograms in the RIGHT lower lobe. Likely loculated pleural fluid along the RIGHT oblique fissure. Loculated fluid along the LEFT fissure additionally and small LEFT effusion. Upper lungs are relatively clear. No evidence of overt pulmonary edema. Upper Abdomen: Limited view of the liver, kidneys, pancreas are unremarkable. Normal adrenal glands. Gallstones noted in the gallbladder Musculoskeletal: No aggressive osseous lesion IMPRESSION: 1. Bilateral pleural effusions RIGHT greater than LEFT. Loculated fluid along the LEFT and RIGHT oblique fissures. 2. RIGHT lower lobe atelectasis versus infiltrate. Favor atelectasis. 3. No evidence of overt pulmonary edema. 4. Cardiomegaly Electronically Signed   By: Suzy Bouchard M.D.   On: 09/08/2016 20:20   Nm Pulmonary Perf And Vent  Result Date: 09/08/2016 CLINICAL DATA:  Short of breath, concern for pulmonary embolism. EXAM: NUCLEAR MEDICINE VENTILATION - PERFUSION LUNG SCAN TECHNIQUE: Ventilation images were obtained in multiple projections using inhaled aerosol Tc-62m DTPA. Perfusion images were obtained in multiple projections after intravenous injection of Tc-71m MAA. RADIOPHARMACEUTICALS:  Thirty-one mCi Technetium-47m DTPA aerosol inhalation and 3.9 mCi Technetium-61m MAA IV COMPARISON:  Radiograph 09/07/2016 FINDINGS: Ventilation: Decreased ventilation to the RIGHT lung base corresponds elevation of hemidiaphragm and effusion. Perfusion: No wedge shaped peripheral perfusion defects to suggest acute pulmonary embolism. Decreased perfusion to the RIGHT lung base corresponds elevated hemidiaphragm. IMPRESSION: 1. No evidence acute pulmonary embolism. 2. Decreased ventilation and profusion to the RIGHT lower lobe corresponds elevated hemidiaphragm and RIGHT effusion seen on comparison radiograph. Electronically Signed   By: Suzy Bouchard M.D.   On:  09/08/2016 17:43  Dg Chest Port 1 View  Result Date: 09/07/2016 CLINICAL DATA:  Congestive heart failure EXAM: PORTABLE CHEST 1 VIEW COMPARISON:  09/02/2016 FINDINGS: Cardiomegaly again noted. Status post CABG. Slight worsening in aeration. Central vascular congestion and mild interstitial prominence bilaterally suspicious for pulmonary edema. Small right pleural effusion with right basilar atelectasis or infiltrate. Extensive degenerative changes bilateral shoulders. IMPRESSION: Slight worsening in aeration. Central vascular congestion and mild interstitial prominence bilaterally suspicious for pulmonary edema. Small right pleural effusion with right basilar atelectasis or infiltrate. Extensive degenerative changes bilateral shoulders. Electronically Signed   By: Lahoma Crocker M.D.   On: 09/07/2016 09:17    I have reviewed the patient's current medications.  Assessment/Plan: Problem #1 difficulty in breathing possibly multifactorial. Continue to have good urine output. He had 2800 mL with Demadex 40 mg once a day. His leg edema has improved. Problem #2 hyperkalemia: His potassium is normal. Problem #3 chronic renal failure presently he doesn't have any nausea or vomiting. Patient off dialysis and his renal function remains stable Problem #4 altered mental status: Seems to be improving. Most likely from medications. Problem #5 history of diabetes: His blood sugar is reasonably controlled. Problem #6 anemia: His hemoglobin is slightly below target goal Problem #7 metabolic bone disease: His calcium and phosphorus is range. Patient is not on a binder. Plan: 1) Will decrease Demadex to 20 mg once a day. 2] will follow patient in 2 weeks once she is discharged. 3] will check his renal panel in the morning.     LOS: 6 days   Haydin Calandra S 09/09/2016,7:55 AM

## 2016-09-09 NOTE — Evaluation (Signed)
Occupational Therapy Evaluation Patient Details Name: [name] MRN: [id number] DOB: Jul 17, 1951 Today's Date: 09/09/2016    History of Present Illness 65 y.o. male with medical history significant for insulin-dependent diabetes mellitus, rheumatoid arthritis, chronic systolic CHF, and chronic kidney disease stage III who presents from his SNF for evaluation of respiratory distress with productive cough. Patient had just been admitted for management of acute renal failure and volume overload, was dialyzed and diuresed, losing 10 pounds during the admission and with a net negative ~4.6 L. He was discharged to the SNF yesterday in much improved and stable condition, but since that time his respiratory status has re-worsened and his cough has increased and changed, now productive of thick discolored sputum. He reports worsening dyspnea at rest.  Dx: HCAP, acute on chronic diastolic CHF, acute encephalopathy after possible partial seizure on 9/10.  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 Pt and wife report pt has been receiving max-total assistance since long stay in ICU at [facility] s/p left foot amputation in June. Pt demonstrates limited ROM in BUE due to severe RA, strength is 3-/5 in BUE. During evaluation pt requesting to roll and scoot up in bed, max A +2 required for positioning, pt unable to assist. Bed mobility to EOB not attempted due to pt lethargy. Pt will benefit from rehab services at SNF on discharge to continue working to improve independence, safety, and strength required to perform ADL and functional mobility tasks.     Follow Up  Recommendations  SNF    Equipment Recommendations  None recommended by OT       Precautions / Restrictions Precautions Precautions: Fall Precaution Comments: due to immobility Restrictions Weight Bearing Restrictions: Yes LLE Weight Bearing: Non weight bearing Other Position/Activity Restrictions: L BKA in 04/2016      Mobility Bed Mobility Overal bed mobility: Needs Assistance Bed Mobility: Rolling (scooting) Rolling: Max assist;+2 for physical assistance         General bed mobility comments: Max A +2 for rolling and supine scoot to the [facility] for positioning.   Transfers                           ADL Overall ADL's : Needs assistance/impaired                                       General ADL Comments: Pt has required max to total assistance for ADL tasks since hospitalization in June.                Pertinent Vitals/Pain Pain Assessment: No/denies pain     Hand Dominance Left   Extremity/Trunk Assessment Upper Extremity Assessment Upper Extremity Assessment: Generalized weakness (severe RA in bilateral hands/wrists)   Lower Extremity Assessment Lower Extremity Assessment: Defer to PT evaluation       Communication Communication Communication: No difficulties   Cognition Arousal/Alertness: Lethargic Behavior During Therapy: WFL for tasks assessed/performed Overall Cognitive Status: Within Functional Limits for tasks assessed  Home Living Family/patient expects to be discharged to:: Skilled nursing facility                                        Prior Functioning/Environment Level of Independence: Needs assistance  Gait / Transfers Assistance Needed: Harrel Lemon lift for transfers at [facility].  ADL's / Homemaking Assistance Needed: Pt requires max/total assist with ADL tasks due to weakness        OT Diagnosis: Generalized weakness   OT Problem List: Decreased  strength;Decreased activity tolerance;Impaired balance (sitting and/or standing);Decreased safety awareness;Decreased knowledge of use of DME or AE;Impaired UE functional use    End of Session    Activity Tolerance: Patient limited by lethargy Patient left: in bed;with call bell/phone within reach;with bed alarm set;with family/visitor present (RT in room)   Time: 1700-1749 OT Time Calculation (min): 18 min Charges:  OT General Charges $OT Visit: 1 Procedure OT Evaluation $OT Eval Moderate Complexity: 1 Procedure   Guadelupe Sabin, OTR/L  [phone] 09/09/2016, 9:09 AM

## 2016-09-09 NOTE — Clinical Social Work Note (Signed)
Pt d/c today back to [name]. Pt, wife, and facility aware and agreeable. Will transport with staff.   Benay Pike, Peyton

## 2016-09-10 ENCOUNTER — Encounter (HOSPITAL_COMMUNITY)
Admission: RE | Admit: 2016-09-10 | Discharge: 2016-09-10 | Disposition: A | Discharge status: Home / Self Care | Payer: Medicare Other | Attending: Pediatrics | Admitting: Pediatrics

## 2016-09-10 LAB — BASIC METABOLIC PANEL
Anion gap: 10 (ref 5–15)
BUN: 62 mg/dL — ABNORMAL HIGH (ref 6–20)
CALCIUM: 8.1 mg/dL — AB (ref 8.9–10.3)
CO2: 37 mmol/L — ABNORMAL HIGH (ref 22–32)
CREATININE: 2 mg/dL — AB (ref 0.61–1.24)
Chloride: 86 mmol/L — ABNORMAL LOW (ref 101–111)
GFR calc Af Amer: 38 mL/min — ABNORMAL LOW (ref >60)
GFR, EST NON AFRICAN AMERICAN: 33 mL/min — AB (ref >60)
Glucose, Bld: 182 mg/dL — ABNORMAL HIGH (ref 65–99)
POTASSIUM: 4.4 mmol/L (ref 3.5–5.1)
SODIUM: 133 mmol/L — AB (ref 135–145)

## 2016-09-12 ENCOUNTER — Encounter: Payer: Self-pay | Admitting: Internal Medicine

## 2016-09-12 ENCOUNTER — Non-Acute Institutional Stay (SKILLED_NURSING_FACILITY): Payer: Medicare Other | Admitting: Internal Medicine

## 2016-09-12 ENCOUNTER — Other Ambulatory Visit: Payer: Self-pay | Admitting: *Deleted

## 2016-09-12 DIAGNOSIS — J189 Pneumonia, unspecified organism: Secondary | ICD-10-CM

## 2016-09-12 DIAGNOSIS — M069 Rheumatoid arthritis, unspecified: Secondary | ICD-10-CM | POA: Diagnosis not present

## 2016-09-12 DIAGNOSIS — IMO0001 Reserved for inherently not codable concepts without codable children: Secondary | ICD-10-CM

## 2016-09-12 DIAGNOSIS — N183 Chronic kidney disease, stage 3 unspecified: Secondary | ICD-10-CM

## 2016-09-12 DIAGNOSIS — E119 Type 2 diabetes mellitus without complications: Secondary | ICD-10-CM | POA: Diagnosis not present

## 2016-09-12 DIAGNOSIS — I5043 Acute on chronic combined systolic (congestive) and diastolic (congestive) heart failure: Secondary | ICD-10-CM | POA: Diagnosis not present

## 2016-09-12 DIAGNOSIS — N179 Acute kidney failure, unspecified: Secondary | ICD-10-CM

## 2016-09-12 DIAGNOSIS — Z794 Long term (current) use of insulin: Secondary | ICD-10-CM | POA: Diagnosis not present

## 2016-09-12 DIAGNOSIS — L8990 Pressure ulcer of unspecified site, unspecified stage: Secondary | ICD-10-CM | POA: Diagnosis not present

## 2016-09-12 DIAGNOSIS — L89152 Pressure ulcer of sacral region, stage 2: Secondary | ICD-10-CM

## 2016-09-12 MED ORDER — PREGABALIN 25 MG PO CAPS: ORAL_CAPSULE | ORAL | 0 refills | Status: DC

## 2016-09-12 MED ORDER — OXYCODONE HCL 5 MG PO TABS: ORAL_TABLET | ORAL | 0 refills | Status: DC

## 2016-09-12 NOTE — Telephone Encounter (Signed)
[facility] #[phone] Fax: [phone]   

## 2016-09-12 NOTE — Progress Notes (Addendum)
Provider: Veleta Miners  Location:   [place] Room Number: 063/K Place of Service:  SNF (31)  PCP: Lala Lund, MD Patient Care Team: Lala Lund, MD as PCP - General (Internal Medicine)  Extended Emergency Contact Information Primary Emergency Contact: Latterell,Martha Address: [address]          [place], [place] [address] [place] Phone: [phone] Mobile Phone: [phone] Relation: Spouse Secondary Emergency Contact: Jannet Mantis [place] Phone: [phone] Relation: Daughter  Code Status: Full Code  Goals of Care: Advanced Directive information Advanced Directives 09/12/2016  Does patient have an advance directive? Yes  Type of Advance Directive (No Data)  Does patient want to make changes to advanced directive? No - Patient declined  Copy of advanced directive(s) in chart? Yes  Would patient like information on creating an advanced directive? -  Pre-existing out of facility DNR order (yellow form or pink MOST form) -      Chief Complaint  Patient presents with  . Readmit To SNF    HPI: Patient is a 65 y.o. male seen today for admission to SNF for Rehab. Patient has been to hospital number of times for Sob due to CHF. This time patient also had Infiltrate significant for Pneumonia. He was treated for with IV antibiotics and now discharged on Oral amoxicillin. Patient also was was found to have B/L pleural effusion but Right more then Left. To be followed by CT scan in 3 months. Patient was also Diuresed with Demadex. He also was dialyzed on 09/09 for Hyperkalemia. During his hospital stay he had one episode of seizure which was self limiting. Neurology evaluated. He also was taken off amiodarone due to Prolonged QT interval. Patient is feeling Much better today. His breathing is better. No Chest pain or cough. No SOB. He still feels weak. But has Good spirit wants to go home as soon as he  can.   Past Medical History:  Diagnosis Date  . Acute on chronic combined systolic and diastolic CHF (congestive heart failure) ([place]) 09/02/2016  . Chronic back pain   . Chronic neck pain   . Coronary artery disease 1993   angioplasty after AMI  . Diabetes mellitus   . Diabetic neuropathy ([place])   . Hyperkalemia   . Hypertension   . Loculated pleural effusion 09/09/2016  . Pancytopenia ([place]) 08/13/2014  . 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  . Rheumatoid arthritis(714.0)   . Seizure ([place]) 08/13/2014   Pt denies   Past Surgical History:  Procedure Laterality Date  . CARDIAC SURGERY    . FRACTURE SURGERY    . JOINT REPLACEMENT      reports that he has never smoked. He has never used smokeless tobacco. He reports that he does not drink alcohol or use drugs. Social History   Social History  . Marital status: Married    Spouse name: N/A  . Number of children: N/A  . Years of education: N/A   Occupational History  . Not on file.   Social History Main Topics  . Smoking status: Never Smoker  . Smokeless tobacco: Never Used  . Alcohol use No  . Drug use: No  . Sexual activity: Not on file   Other Topics Concern  . Not on file   Social History Narrative  . No narrative on file    Functional Status Survey:    Family History  Problem Relation Age of Onset  . Diabetes Father   . Dementia Father   . Heart attack Brother     multiple brothers with MIs  . Hypertension Mother   . Stroke Mother     Health Maintenance  Topic Date Due  . Hepatitis C Screening  07/24/1951  . FOOT EXAM  09/05/1959  . OPHTHALMOLOGY EXAM  09/05/1959  . URINE MICROALBUMIN  09/05/1959  . TETANUS/TDAP  09/04/1968  . COLONOSCOPY  09/05/1999  . ZOSTAVAX  09/04/2009  . HEMOGLOBIN A1C  06/11/2013  . PNA vac Low Risk Adult (1 of 2 - PCV13) 09/04/2014  . INFLUENZA VACCINE  11/25/2016 (Originally 07/26/2016)    Allergies  Allergen Reactions  .  Sulfa Antibiotics Rash      Medication List       Accurate as of 09/12/16 12:25 PM. Always use your most recent med list.          acetaminophen 325 MG tablet Commonly known as:  TYLENOL Take 650 mg by mouth every 6 (six) hours as needed for mild pain or moderate pain.   ALPRAZolam 0.25 MG tablet Commonly known as:  XANAX Take 1 tablet (0.25 mg total) by mouth 3 (three) times daily as needed for anxiety.   amoxicillin 250 MG capsule Commonly known as:  AMOXIL Take 1 capsule (250 mg total) by mouth 2 (two) times daily. Take antibiotic for 3 more days.   aspirin 81 MG chewable tablet Chew 81 mg by mouth every morning.   atorvastatin 80 MG tablet Commonly known as:  LIPITOR Take 80 mg by mouth daily.   docusate sodium 100 MG capsule Commonly known as:  COLACE Take 100 mg by mouth 2 (two) times daily.   HUMULIN N 100 UNIT/ML injection Generic drug:  insulin NPH Human Give 5 units subcutaneous twice a day   hydroxychloroquine 200 MG tablet Commonly known as:  PLAQUENIL Take 200 mg by mouth daily.   levalbuterol 0.63 MG/3ML nebulizer solution Commonly known as:  XOPENEX Take 3 mLs (0.63 mg total) by nebulization every 4 (four) hours as needed for wheezing or shortness of breath.   Melatonin 3 MG Tabs Take 2 tablets by mouth at bedtime   metoprolol succinate 25 MG 24 hr tablet Commonly known as:  TOPROL-XL Take 1.5 tablets (37.5 mg total) by mouth every morning.   olopatadine 0.1 % ophthalmic solution Commonly known as:  PATANOL Place 1 drop into both eyes 2 (two) times daily.   omega-3 acid ethyl esters 1 g capsule Commonly known as:  LOVAZA Take 2 g by mouth 2 (two) times daily.   oxyCODONE 5 MG immediate release tablet Commonly known as:  Oxy IR/ROXICODONE Take one tablet by mouth twice daily as needed for severe pain   OXYGEN Inhale 2 L into the lungs continuous. O 2 at 2 LPM via n/c to keep O2 sat above 90 % every shift   polyethylene glycol powder  powder Commonly known as:  MIRALAX Take 17 g by mouth 2 (two) times daily.   predniSONE 2.5 MG tablet Commonly known as:  DELTASONE Take 3 tablets (7.5 mg total) by mouth daily with breakfast.   pregabalin 25 MG capsule Commonly known as:  LYRICA Take one capsule by mouth at bedtime   PRO-STAT 64 PO Take 30 mLs by mouth 2 (two) times daily.   REFRESH P.M. OP Apply 1 drop to eye daily as needed (FOR DY EYES). ONE APPLICATION BOTH EYES. For dry eyes not relieved by  theratears every 6 hours   tamsulosin 0.4 MG Caps capsule Commonly known as:  FLOMAX Take 0.4 mg by mouth at bedtime.   THERA-M ENHANCED PO 1 tablet by mouth daily   THERATEARS 0.25 % Soln Generic drug:  Carboxymethylcellulose Sodium Apply 2 drops to both eyes three times a day   thiamine 100 MG tablet Commonly known as:  VITAMIN B-1 Take 100 mg by mouth daily.   torsemide 20 MG tablet Commonly known as:  DEMADEX Take 1 tablet (20 mg total) by mouth daily.   traZODone 50 MG tablet Commonly known as:  DESYREL Take 50 mg by mouth at bedtime.   vitamin C 500 MG tablet Commonly known as:  ASCORBIC ACID Take 500 mg by mouth daily.   zinc sulfate 220 (50 Zn) MG capsule Take 220 mg by mouth daily.       Review of Systems  HENT: Negative.   Respiratory: Negative for apnea, cough, chest tightness, shortness of breath and wheezing.   Cardiovascular: Negative for chest pain, palpitations and leg swelling.  Gastrointestinal: Negative for abdominal distention, abdominal pain, anal bleeding, blood in stool and constipation.  Neurological: Positive for tremors, weakness and numbness. Negative for dizziness and light-headedness.    Vitals:   09/12/16 1101  BP: 137/72  Pulse: 87  Resp: 20  Temp: 97.7 F (36.5 C)  TempSrc: Oral   There is no height or weight on file to calculate BMI. Physical Exam  Constitutional: He is oriented to person, place, and time. He is cooperative.  HENT:  Head: Normocephalic.   Mouth/Throat: Oropharynx is clear and moist.  Cardiovascular: Normal rate, regular rhythm and normal heart sounds.   Pulmonary/Chest: Effort normal and breath sounds normal. He has no wheezes. He has no rales.  Abdominal: Soft. Bowel sounds are normal. He exhibits no distension. There is no tenderness. There is no rebound.  Musculoskeletal: He exhibits no edema.  Neurological: He is alert and oriented to person, place, and time.  Skin:  Has open  area around left clavicle where the port a cath was pulled as he does not need dialysis anymore. With hematoma around it.  Had Black eschar on the right foot. Which actually is his Callus. Has Stage 2 Pre cubitus ulcer with redness around it. Open lacerated wound on left thigh. Clean. Well healing.    Labs reviewed: Basic Metabolic Panel:  Recent Labs  08/26/16 0509  09/06/16 0623  09/07/16 0544 09/08/16 0546 09/09/16 0656 09/10/16 0450  NA 133*  < > 133*  < > 134* 134* 134* 133*  K 3.3*  < > 3.8  < > 3.9 3.7 3.9 4.4  CL 100*  < > 96*  < > 90* 91* 89* 86*  CO2 29  < > 32  < > 33* 35* 36* 37*  GLUCOSE 79  < > 170*  < > 102* 177* 133* 182*  BUN 16  < > 39*  < > 44* 47* 54* 62*  CREATININE 1.36*  < > 1.66*  < > 1.87* 1.80* 1.97* 2.00*  CALCIUM 8.0*  < > 8.0*  < > 8.3* 8.3* 8.2* 8.1*  MG 2.5*  --   --   --   --   --  1.7  --   PHOS  --   < > 4.0  --  4.1  --  4.4  --   < > = values in this interval not displayed. Liver Function Tests:  Recent Labs  08/09/16 0730 08/26/16  1751  08/27/16 0630  09/06/16 0623 09/07/16 0544 09/09/16 0656  AST 28 26  --  25  --   --   --   --   ALT 8* 14*  --  14*  --   --   --   --   ALKPHOS 320* 191*  --  211*  --   --   --   --   BILITOT 0.8 0.7  --  0.7  --   --   --   --   PROT 4.5* 5.0*  --  4.7*  --   --   --   --   ALBUMIN 1.8* 1.8*  < > 1.8*  1.8*  < > 1.9* 1.9* 1.9*  < > = values in this interval not displayed. No results for input(s): LIPASE, AMYLASE in the last 8760 hours. No  results for input(s): AMMONIA in the last 8760 hours. CBC:  Recent Labs  08/17/16 1700 08/26/16 0509  09/02/16 1559  09/04/16 0627 09/05/16 0558 09/08/16 0546  WBC 6.8 7.9  < > 10.1  < > 8.5 8.2 10.1  NEUTROABS 5.0 4.8  --  8.6*  --   --   --   --   HGB 9.2* 9.3*  < > 9.8*  < > 9.2* 9.2* 9.4*  HCT 29.9* 29.8*  < > 30.5*  < > 29.3* 28.9* 28.7*  MCV 101.0* 99.7  < > 96.2  < > 99.0 98.0 96.0  PLT 246 222  < > 248  < > 224 216 255  < > = values in this interval not displayed. Cardiac Enzymes:  Recent Labs  09/08/16 1939 09/09/16 0120 09/09/16 0656  TROPONINI 0.05* 0.05* 0.05*   BNP: Invalid input(s): POCBNP Lab Results  Component Value Date   HGBA1C 6.9 (H) 12/11/2012   Lab Results  Component Value Date   TSH 2.421 08/26/2016   Lab Results  Component Value Date   WCHENIDP82 423 08/13/2014   Lab Results  Component Value Date   FOLATE >20.0 08/13/2014   Lab Results  Component Value Date   IRON 48 08/13/2014   TIBC 264 08/13/2014   FERRITIN 67 08/13/2014    Imaging and Procedures obtained prior to SNF admission: No results found.  Assessment/Plan  Acute on chronic combined systolic and diastolic CHF (congestive heart failure)  Patient EF is around 30-35 % Patient doing well on lasix and demadex Will continue POX Q shift. Weight daily. Repeat CMP.  Pneumonia  Patient afebrile doing well. Continue Amoxicillin. SOB is much better too. Possible repeat CT scan of Chest in 3 months to follow Pleural effusion.   Acute renal failure superimposed on stage 3 chronic kidney disease   Creat slowly up to 2.0 Follow up with Dr Lowanda Foster. Right now he is stable on Lasix and Demadex. Will recheck BMP to follow Potassium.  Sacral decubitus ulcer, stage II  Continue foam dressing Also Nystatin cream around the ulcer.  Continue using BOOT to take pressure off.right leg. Pulses feeble. Prognosis stays poor.  Insulin dependent diabetes mellitus  Continue Accu  checks. BS much better since off prednisone.  Continue sliding scale as needed.  Rheumatoid arthritis involving multiple sites  Will slowly taper him back to prednisone 5 mg his usual dose. Continue plaquenil.  Prolong QT Was taken off amiodarone. Will continue to monitor HR  Episode of Seizure in the hospital  Was evaluated by neurologist Just monitor right now.     Family/ staff Communication:  Labs/tests ordered:

## 2016-09-13 ENCOUNTER — Encounter (HOSPITAL_COMMUNITY)
Admission: RE | Admit: 2016-09-13 | Discharge: 2016-09-13 | Disposition: A | Discharge status: Home / Self Care | Payer: Medicare Other | Source: Skilled Nursing Facility | Attending: *Deleted | Admitting: *Deleted

## 2016-09-13 LAB — COMPREHENSIVE METABOLIC PANEL
ALK PHOS: 180 U/L — AB (ref 38–126)
ALT: 20 U/L (ref 17–63)
AST: 27 U/L (ref 15–41)
Albumin: 2.2 g/dL — ABNORMAL LOW (ref 3.5–5.0)
Anion gap: 7 (ref 5–15)
BUN: 73 mg/dL — AB (ref 6–20)
CALCIUM: 8.6 mg/dL — AB (ref 8.9–10.3)
CHLORIDE: 90 mmol/L — AB (ref 101–111)
CO2: 38 mmol/L — AB (ref 22–32)
CREATININE: 2.08 mg/dL — AB (ref 0.61–1.24)
GFR calc non Af Amer: 31 mL/min — ABNORMAL LOW (ref >60)
GFR, EST AFRICAN AMERICAN: 36 mL/min — AB (ref >60)
Glucose, Bld: 176 mg/dL — ABNORMAL HIGH (ref 65–99)
Potassium: 5.2 mmol/L — ABNORMAL HIGH (ref 3.5–5.1)
SODIUM: 135 mmol/L (ref 135–145)
Total Bilirubin: 0.7 mg/dL (ref 0.3–1.2)
Total Protein: 6.1 g/dL — ABNORMAL LOW (ref 6.5–8.1)

## 2016-09-13 LAB — CBC
HCT: 29.7 % — ABNORMAL LOW (ref 39.0–52.0)
HEMOGLOBIN: 9.2 g/dL — AB (ref 13.0–17.0)
MCH: 30 pg (ref 26.0–34.0)
MCHC: 31 g/dL (ref 30.0–36.0)
MCV: 96.7 fL (ref 78.0–100.0)
Platelets: 260 10*3/uL (ref 150–400)
RBC: 3.07 MIL/uL — AB (ref 4.22–5.81)
RDW: 16.1 % — ABNORMAL HIGH (ref 11.5–15.5)
WBC: 9.6 10*3/uL (ref 4.0–10.5)

## 2016-09-15 DIAGNOSIS — L89150 Pressure ulcer of sacral region, unstageable: Secondary | ICD-10-CM | POA: Diagnosis not present

## 2016-09-15 DIAGNOSIS — L97112 Non-pressure chronic ulcer of right thigh with fat layer exposed: Secondary | ICD-10-CM | POA: Diagnosis not present

## 2016-09-15 DIAGNOSIS — Z89512 Acquired absence of left leg below knee: Secondary | ICD-10-CM | POA: Diagnosis not present

## 2016-09-15 DIAGNOSIS — E11622 Type 2 diabetes mellitus with other skin ulcer: Secondary | ICD-10-CM | POA: Diagnosis not present

## 2016-09-16 ENCOUNTER — Encounter: Payer: Self-pay | Admitting: Internal Medicine

## 2016-09-16 ENCOUNTER — Encounter (HOSPITAL_COMMUNITY)
Admission: RE | Admit: 2016-09-16 | Discharge: 2016-09-16 | Disposition: A | Discharge status: Home / Self Care | Payer: Medicare Other | Source: Skilled Nursing Facility | Attending: *Deleted | Admitting: *Deleted

## 2016-09-16 ENCOUNTER — Non-Acute Institutional Stay (SKILLED_NURSING_FACILITY): Payer: Medicare Other | Admitting: Internal Medicine

## 2016-09-16 DIAGNOSIS — R52 Pain, unspecified: Secondary | ICD-10-CM

## 2016-09-16 DIAGNOSIS — Z5189 Encounter for other specified aftercare: Secondary | ICD-10-CM | POA: Diagnosis not present

## 2016-09-16 DIAGNOSIS — E875 Hyperkalemia: Secondary | ICD-10-CM | POA: Diagnosis not present

## 2016-09-16 DIAGNOSIS — R634 Abnormal weight loss: Secondary | ICD-10-CM | POA: Diagnosis not present

## 2016-09-16 LAB — BASIC METABOLIC PANEL
ANION GAP: 8 (ref 5–15)
BUN: 77 mg/dL — ABNORMAL HIGH (ref 6–20)
CALCIUM: 8.8 mg/dL — AB (ref 8.9–10.3)
CO2: 35 mmol/L — ABNORMAL HIGH (ref 22–32)
CREATININE: 2.19 mg/dL — AB (ref 0.61–1.24)
Chloride: 92 mmol/L — ABNORMAL LOW (ref 101–111)
GFR calc Af Amer: 34 mL/min — ABNORMAL LOW (ref >60)
GFR, EST NON AFRICAN AMERICAN: 29 mL/min — AB (ref >60)
GLUCOSE: 139 mg/dL — AB (ref 65–99)
Potassium: 5.2 mmol/L — ABNORMAL HIGH (ref 3.5–5.1)
Sodium: 135 mmol/L (ref 135–145)

## 2016-09-16 NOTE — Progress Notes (Signed)
Location:   [facility] Room Number: 263/Z Place of Service:  SNF (31) Provider:  Nancy Marus, MD  Patient Care Team: Lala Lund, MD as PCP - General (Internal Medicine)  Extended Emergency Contact Information Primary Emergency Contact: Caisse,Martha Address: [address]          [place], [place] [address] [place] Phone: [phone] Mobile Phone: [phone] Relation: Spouse Secondary Emergency Contact: [place] Phone: [phone] Relation: Daughter  Code Status:  Full Code Goals of care: Advanced Directive information Advanced Directives 09/16/2016  Does patient have an advance directive? Yes  Type of Advance Directive (No Data)  Does patient want to make changes to advanced directive? No - Patient declined  Copy of advanced directive(s) in chart? Yes  Would patient like information on creating an advanced directive? -  Pre-existing out of facility DNR order (yellow form or pink MOST form) -     Chief Complaint  Patient presents with  . Acute Visit    weight lose and pain    HPI:  Pt is a 65 y.o. male seen today for an acute visit for Complaints of pain-also weight loss.  He has a very complex history history of hospitalizations for CHF and pneumonia-he also has significant renal issues and at timess required dialysis--he is followed by nephrology.  Patient actually has had a period her relatives stability here recently labs recently showed a mildly elevated potassium of  5.2 on a lab done 3 days ago-this appears to be a comparable with lab done today which shows a potassium 5.2 as well creatinine this relatively at baseline at 2.19 BUN is up somewhat at 77.  Nephrology is following him has has recommended low potassium foods and monitoring-  He is also had significant weight loss initially when he was admitted here weight was in the mid 190s however he did go to  the hospital was aggressively diuresed and appears weight substantially went down weight today is 156.7 he has lost about 3 pounds over recent days but this does not appear to be a huge change.  He is eating and drinking well and actually appears to be getting stronger somewhat although he is a very fragile individual.  He does have a history of significant generalized pain he is on OxyIR 5 mg twice a day when necessary-this is complicated with a history of rheumatoid arthritis-he would like his pain medicine more frequent.    Past Medical History:  Diagnosis Date  . Acute on chronic combined systolic and diastolic CHF (congestive heart failure) ([place]) 09/02/2016  . Chronic back pain   . Chronic neck pain   . Coronary artery disease 1993   angioplasty after AMI  . Diabetes mellitus   . Diabetic neuropathy ([place])   . Hyperkalemia   . Hypertension   . Loculated pleural effusion 09/09/2016  . Pancytopenia ([place]) 08/13/2014  . 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  . Rheumatoid arthritis(714.0)   . Seizure ([place]) 08/13/2014   Pt denies   Past Surgical History:  Procedure Laterality Date  . CARDIAC SURGERY    . FRACTURE SURGERY    . JOINT REPLACEMENT      Allergies  Allergen Reactions  . Sulfa Antibiotics Rash      Medication List       Accurate as of 09/16/16  1:48 PM. Always use your most recent med  list.          acetaminophen 325 MG tablet Commonly known as:  TYLENOL Take 650 mg by mouth every 6 (six) hours as needed for mild pain or moderate pain.   ALPRAZolam 0.25 MG tablet Commonly known as:  XANAX Take 1 tablet (0.25 mg total) by mouth 3 (three) times daily as needed for anxiety.   aspirin 81 MG chewable tablet Chew 81 mg by mouth every morning.   atorvastatin 80 MG tablet Commonly known as:  LIPITOR Take 80 mg by mouth daily.   docusate sodium 100 MG capsule Commonly known as:  COLACE Take 100 mg by mouth 2  (two) times daily.   HUMULIN N 100 UNIT/ML injection Generic drug:  insulin NPH Human Give 5 units subcutaneous twice a day   hydroxychloroquine 200 MG tablet Commonly known as:  PLAQUENIL Take 200 mg by mouth daily.   levalbuterol 0.63 MG/3ML nebulizer solution Commonly known as:  XOPENEX Take 3 mLs (0.63 mg total) by nebulization every 4 (four) hours as needed for wheezing or shortness of breath.   Melatonin 3 MG Tabs Take 2 tablets by mouth at bedtime   metoprolol succinate 25 MG 24 hr tablet Commonly known as:  TOPROL-XL Take 1.5 tablets (37.5 mg total) by mouth every morning.   nystatin cream Commonly known as:  MYCOSTATIN Apply to bottom rash QD   olopatadine 0.1 % ophthalmic solution Commonly known as:  PATANOL Place 1 drop into both eyes 2 (two) times daily.   omega-3 acid ethyl esters 1 g capsule Commonly known as:  LOVAZA Take 2 g by mouth 2 (two) times daily.   oxyCODONE 5 MG immediate release tablet Commonly known as:  Oxy IR/ROXICODONE Take one tablet by mouth twice daily as needed for severe pain   OXYGEN Inhale 2 L into the lungs continuous. O 2 at 2 LPM via n/c to keep O2 sat above 90 % every shift   polyethylene glycol powder powder Commonly known as:  MIRALAX Take 17 g by mouth 2 (two) times daily.   predniSONE 2.5 MG tablet Commonly known as:  DELTASONE Take 3 tablets (7.5 mg total) by mouth daily with breakfast.   pregabalin 25 MG capsule Commonly known as:  LYRICA Take one capsule by mouth at bedtime   PRO-STAT 64 PO Take 30 mLs by mouth 2 (two) times daily.   REFRESH P.M. OP Apply 1 drop to eye daily as needed (FOR DY EYES). ONE APPLICATION BOTH EYES. For dry eyes not relieved by theratears every 6 hours   tamsulosin 0.4 MG Caps capsule Commonly known as:  FLOMAX Take 0.4 mg by mouth at bedtime.   THERA-M ENHANCED PO 1 tablet by mouth daily   THERATEARS 0.25 % Soln Generic drug:  Carboxymethylcellulose Sodium Apply 2 drops to  both eyes three times a day   thiamine 100 MG tablet Commonly known as:  VITAMIN B-1 Take 100 mg by mouth daily.   torsemide 20 MG tablet Commonly known as:  DEMADEX Take 1 tablet (20 mg total) by mouth daily.   traZODone 50 MG tablet Commonly known as:  DESYREL Take 50 mg by mouth at bedtime.   vitamin C 500 MG tablet Commonly known as:  ASCORBIC ACID Take 500 mg by mouth daily.   zinc sulfate 220 (50 Zn) MG capsule Take 220 mg by mouth daily.       Review of Systems  In general says he is feeling somewhat stronger and better does not  complain ofever or chills HENT: Negative.   Respiratory: Negative for apnea, cough, chest tightness, shortness of breath and wheezing.   Cardiovascular: Negative for chest pain, palpitations and leg swelling.  Gastrointestinal: Negative for abdominal distention, abdominal pain, anal bleeding, blood in stool and constipation.  Muscle skeletal does complain of some generalized pain would like OxyIR frequency increased Neurological: Positive for weakness. Negative for dizziness and light-headedness   There is no immunization history for the selected administration types on file for this patient. Pertinent  Health Maintenance Due  Topic Date Due  . FOOT EXAM  09/05/1961  . OPHTHALMOLOGY EXAM  09/05/1959  . URINE MICROALBUMIN  09/05/1959  . COLONOSCOPY  09/05/1999  . HEMOGLOBIN A1C  06/11/2013  . PNA vac Low Risk Adult (1 of 2 - PCV13) 09/04/2014  . INFLUENZA VACCINE  11/25/2016 (Originally 07/26/2016)   No flowsheet data found. Functional Status Survey:    He is afebrile pulse 81 respirations 17 blood pressure 128/82 weight is 156.7  Physical Exam   Constitutional: He is oriented to person, place, and time. He is cooperative. Appears to be feeling better than when I have seen him previously His skin is warm and dry HENT:  Head: Normocephalic.  Mouth/Throat: Oropharynx is clear and moist.  Cardiovascular: Normal rate, regular rhythm  and normal heart sounds.   Pulmonary/Chest: Effort normal and breath sounds normal. He has no wheezes. He has no rales shallow air entry but no overt congestion noted.  Abdominal: Soft. Bowel sounds are normal. He exhibits no distension. There is no tenderness. There is no rebound.  Musculoskeletal: He exhibits no edema left stump area is wrapped. Moves other extremities at baseline with some frailty rheumatoid changes are noted  Neurological: He is alert and oriented to person, place, and time.  :       Labs reviewed:  Recent Labs  08/26/16 0509  09/06/16 0623  09/07/16 0544  09/09/16 0656 09/10/16 0450 09/13/16 0715 09/16/16 0500  NA 133*  < > 133*  < > 134*  < > 134* 133* 135 135  K 3.3*  < > 3.8  < > 3.9  < > 3.9 4.4 5.2* 5.2*  CL 100*  < > 96*  < > 90*  < > 89* 86* 90* 92*  CO2 29  < > 32  < > 33*  < > 36* 37* 38* 35*  GLUCOSE 79  < > 170*  < > 102*  < > 133* 182* 176* 139*  BUN 16  < > 39*  < > 44*  < > 54* 62* 73* 77*  CREATININE 1.36*  < > 1.66*  < > 1.87*  < > 1.97* 2.00* 2.08* 2.19*  CALCIUM 8.0*  < > 8.0*  < > 8.3*  < > 8.2* 8.1* 8.6* 8.8*  MG 2.5*  --   --   --   --   --  1.7  --   --   --   PHOS  --   < > 4.0  --  4.1  --  4.4  --   --   --   < > = values in this interval not displayed.  Recent Labs  08/26/16 0509  08/27/16 0630  09/07/16 0544 09/09/16 0656 09/13/16 0715  AST 26  --  25  --   --   --  27  ALT 14*  --  14*  --   --   --  20  ALKPHOS 191*  --  211*  --   --   --  180*  BILITOT 0.7  --  0.7  --   --   --  0.7  PROT 5.0*  --  4.7*  --   --   --  6.1*  ALBUMIN 1.8*  < > 1.8*  1.8*  < > 1.9* 1.9* 2.2*  < > = values in this interval not displayed.  Recent Labs  08/17/16 1700 08/26/16 0509  09/02/16 1559  09/05/16 0558 09/08/16 0546 09/13/16 0715  WBC 6.8 7.9  < > 10.1  < > 8.2 10.1 9.6  NEUTROABS 5.0 4.8  --  8.6*  --   --   --   --   HGB 9.2* 9.3*  < > 9.8*  < > 9.2* 9.4* 9.2*  HCT 29.9* 29.8*  < > 30.5*  < > 28.9* 28.7* 29.7*    MCV 101.0* 99.7  < > 96.2  < > 98.0 96.0 96.7  PLT 246 222  < > 248  < > 216 255 260  < > = values in this interval not displayed. Lab Results  Component Value Date   TSH 2.421 08/26/2016   Lab Results  Component Value Date   HGBA1C 6.9 (H) 12/11/2012   No results found for: CHOL, HDL, LDLCALC, LDLDIRECT, TRIG, CHOLHDL  Significant Diagnostic Results in last 30 days:  Ct Chest Wo Contrast  Result Date: 09/08/2016 CLINICAL DATA:  Short of breath, congestive heart failure. EXAM: CT CHEST WITHOUT CONTRAST TECHNIQUE: Multidetector CT imaging of the chest was performed following the standard protocol without IV contrast. COMPARISON:  CT scan 09/08/2016, 09/07/2016, chest radiograph FINDINGS: Cardiovascular: Coronary artery calcification and aortic atherosclerotic calcification. Midline sternotomy and CABG anatomy. No pericardial fluid Mediastinum/Nodes: No axillary or supraclavicular adenopathy. No mediastinal hilar adenopathy. No pericardial fluid. Lungs/Pleura: There is a moderate RIGHT effusion. There is atelectasis with air bronchograms in the RIGHT lower lobe. Likely loculated pleural fluid along the RIGHT oblique fissure. Loculated fluid along the LEFT fissure additionally and small LEFT effusion. Upper lungs are relatively clear. No evidence of overt pulmonary edema. Upper Abdomen: Limited view of the liver, kidneys, pancreas are unremarkable. Normal adrenal glands. Gallstones noted in the gallbladder Musculoskeletal: No aggressive osseous lesion IMPRESSION: 1. Bilateral pleural effusions RIGHT greater than LEFT. Loculated fluid along the LEFT and RIGHT oblique fissures. 2. RIGHT lower lobe atelectasis versus infiltrate. Favor atelectasis. 3. No evidence of overt pulmonary edema. 4. Cardiomegaly Electronically Signed   By: Suzy Bouchard M.D.   On: 09/08/2016 20:20   Nm Pulmonary Perf And Vent  Result Date: 09/08/2016 CLINICAL DATA:  Short of breath, concern for pulmonary embolism. EXAM:  NUCLEAR MEDICINE VENTILATION - PERFUSION LUNG SCAN TECHNIQUE: Ventilation images were obtained in multiple projections using inhaled aerosol Tc-84m DTPA. Perfusion images were obtained in multiple projections after intravenous injection of Tc-51m MAA. RADIOPHARMACEUTICALS:  Thirty-one mCi Technetium-53m DTPA aerosol inhalation and 3.9 mCi Technetium-7m MAA IV COMPARISON:  Radiograph 09/07/2016 FINDINGS: Ventilation: Decreased ventilation to the RIGHT lung base corresponds elevation of hemidiaphragm and effusion. Perfusion: No wedge shaped peripheral perfusion defects to suggest acute pulmonary embolism. Decreased perfusion to the RIGHT lung base corresponds elevated hemidiaphragm. IMPRESSION: 1. No evidence acute pulmonary embolism. 2. Decreased ventilation and profusion to the RIGHT lower lobe corresponds elevated hemidiaphragm and RIGHT effusion seen on comparison radiograph. Electronically Signed   By: Suzy Bouchard M.D.   On: 09/08/2016 17:43   Dg Chest Port 1 View  Result Date: 09/07/2016 CLINICAL DATA:  Congestive heart failure EXAM: PORTABLE CHEST 1 VIEW COMPARISON:  09/02/2016 FINDINGS: Cardiomegaly again noted. Status post CABG. Slight worsening in aeration. Central vascular congestion and mild interstitial prominence bilaterally suspicious for pulmonary edema. Small right pleural effusion with right basilar atelectasis or infiltrate. Extensive degenerative changes bilateral shoulders. IMPRESSION: Slight worsening in aeration. Central vascular congestion and mild interstitial prominence bilaterally suspicious for pulmonary edema. Small right pleural effusion with right basilar atelectasis or infiltrate. Extensive degenerative changes bilateral shoulders. Electronically Signed   By: Lahoma Crocker M.D.   On: 09/07/2016 09:17   Dg Chest Portable 1 View  Result Date: 09/02/2016 CLINICAL DATA:  Cough. EXAM: PORTABLE CHEST 1 VIEW COMPARISON:  08/26/2016. FINDINGS: Dialysis catheter in stable position.  Prior CABG. Cardiomegaly with diffuse bilateral pulmonary infiltrates again noted. Right pleural effusion. Findings consistent congestive heart failure. Similar findings noted on prior exam. IMPRESSION: 1. Dialysis catheter in stable position. 2. Prior CABG. Cardiomegaly with bilateral pulmonary infiltrates consistent with pulmonary edema. Right pleural effusion. Findings consistent with congestive heart failure. Bilateral pneumonia cannot be excluded. Electronically Signed   By: Marcello Moores  Register   On: 09/02/2016 17:01   Dg Chest Port 1 View  Result Date: 08/26/2016 CLINICAL DATA:  Nonproductive cough, chest pressure and worsening dyspnea for 4 days EXAM: PORTABLE CHEST 1 VIEW COMPARISON:  04/04/2016 FINDINGS: There are pleural effusions bilaterally, right greater than left. There is hazy central airspace opacity which may represent alveolar edema. Mild pulmonary vascular prominence. Mild cardiomegaly. There is a dual-lumen left-sided central line extending into the right atrium. Incidental findings include severe shoulder arthropathy bilaterally. IMPRESSION: Congestive heart failure with alveolar edema and/or right pleural effusion. Electronically Signed   By: Andreas Newport M.D.   On: 08/26/2016 05:25    Assessment/Plan  #1-hyperkalemia this is followed as well by dialysis recommendation for low potassium diet-we will update this on Monday, September 25 clinically he appears to be stable in this regards to whether have to be watched closely-creatinine of 2.19 appears relatively baseline with recent values.  #2 history of weight loss this appears to be some fairly small this point at this point will monitor he is eating and drinking well I suspect a significant weight loss earlier was more fluid related with his history CHF he is on Demadex 20 mg daily.  #3 pain management with history of rheumatoid arthritis will increase his OxyIR 2-5 milligrams 36 hours when necessary and  monitor.  [place], [place], [place]

## 2016-09-19 ENCOUNTER — Encounter (HOSPITAL_COMMUNITY)
Admission: RE | Admit: 2016-09-19 | Discharge: 2016-09-19 | Disposition: A | Discharge status: Home / Self Care | Payer: Medicare Other | Source: Skilled Nursing Facility | Attending: *Deleted | Admitting: *Deleted

## 2016-09-19 ENCOUNTER — Non-Acute Institutional Stay (SKILLED_NURSING_FACILITY): Payer: Medicare Other | Admitting: Internal Medicine

## 2016-09-19 ENCOUNTER — Other Ambulatory Visit: Payer: Self-pay | Admitting: *Deleted

## 2016-09-19 ENCOUNTER — Encounter: Payer: Self-pay | Admitting: Internal Medicine

## 2016-09-19 DIAGNOSIS — N179 Acute kidney failure, unspecified: Secondary | ICD-10-CM

## 2016-09-19 DIAGNOSIS — Z794 Long term (current) use of insulin: Secondary | ICD-10-CM

## 2016-09-19 DIAGNOSIS — N183 Chronic kidney disease, stage 3 unspecified: Secondary | ICD-10-CM

## 2016-09-19 DIAGNOSIS — E875 Hyperkalemia: Secondary | ICD-10-CM

## 2016-09-19 DIAGNOSIS — I5043 Acute on chronic combined systolic (congestive) and diastolic (congestive) heart failure: Secondary | ICD-10-CM | POA: Diagnosis not present

## 2016-09-19 DIAGNOSIS — IMO0001 Reserved for inherently not codable concepts without codable children: Secondary | ICD-10-CM

## 2016-09-19 DIAGNOSIS — E119 Type 2 diabetes mellitus without complications: Secondary | ICD-10-CM | POA: Diagnosis not present

## 2016-09-19 LAB — COMPREHENSIVE METABOLIC PANEL
ALBUMIN: 2.2 g/dL — AB (ref 3.5–5.0)
ALT: 42 U/L (ref 17–63)
AST: 39 U/L (ref 15–41)
Alkaline Phosphatase: 303 U/L — ABNORMAL HIGH (ref 38–126)
Anion gap: 10 (ref 5–15)
BUN: 89 mg/dL — ABNORMAL HIGH (ref 6–20)
CALCIUM: 8.6 mg/dL — AB (ref 8.9–10.3)
CHLORIDE: 93 mmol/L — AB (ref 101–111)
CO2: 32 mmol/L (ref 22–32)
CREATININE: 2.17 mg/dL — AB (ref 0.61–1.24)
GFR calc Af Amer: 34 mL/min — ABNORMAL LOW (ref >60)
GFR, EST NON AFRICAN AMERICAN: 30 mL/min — AB (ref >60)
Glucose, Bld: 153 mg/dL — ABNORMAL HIGH (ref 65–99)
POTASSIUM: 5.5 mmol/L — AB (ref 3.5–5.1)
SODIUM: 135 mmol/L (ref 135–145)
Total Bilirubin: 0.6 mg/dL (ref 0.3–1.2)
Total Protein: 5.9 g/dL — ABNORMAL LOW (ref 6.5–8.1)

## 2016-09-19 MED ORDER — OXYCODONE HCL 5 MG PO TABS: ORAL_TABLET | ORAL | 0 refills | Status: DC

## 2016-09-19 NOTE — Progress Notes (Signed)
Location:   Export Room Number: 272/Z Place of Service:  SNF (31) Provider:  Deon Pilling, MD  Patient Care Team: Lala Lund, MD as PCP - General (Internal Medicine)  Extended Emergency Contact Information Primary Emergency Contact: Zuleta,Martha Address: [address]          [place], [place] [address] [place] Phone: [phone] Mobile Phone: [phone] Relation: Spouse Secondary Emergency Contact: [place] Phone: [phone] Relation: Daughter  Code Status:  Full Code Goals of care: Advanced Directive information Advanced Directives 09/19/2016  Does patient have an advance directive? Yes  Type of Advance Directive (No Data)  Does patient want to make changes to advanced directive? No - Patient declined  Copy of advanced directive(s) in chart? Yes  Would patient like information on creating an advanced directive? -  Pre-existing out of facility DNR order (yellow form or pink MOST form) -     Chief Complaint  Patient presents with  . Acute Visit    HPI:  Pt is a 65 y.o. male seen today for an acute visit for Hyperkalemia and worsening Renal function. Patient has had worsening renal function in past few months. He required Dialysis secondary to Hyperkalemia in the hospital. His Potassium has slowly going up to 5.5 this morning. He though clinically is doing well. His wife thinks he was getting food with no restrictions for renal diet. Patient is eating well and his breathing is better. He denied any Chest pain or SOB. He is doing well with Therapy and wants to know when he can go home.  Past Medical History:  Diagnosis Date  . Acute on chronic combined systolic and diastolic CHF (congestive heart failure) ([place]) 09/02/2016  . Chronic back pain   . Chronic neck pain   . Coronary artery disease 1993   angioplasty after AMI  . Diabetes mellitus   . Diabetic  neuropathy ([place])   . Hyperkalemia   . Hypertension   . Loculated pleural effusion 09/09/2016  . Pancytopenia ([place]) 08/13/2014  . 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  . Rheumatoid arthritis(714.0)   . Seizure ([place]) 08/13/2014   Pt denies   Past Surgical History:  Procedure Laterality Date  . CARDIAC SURGERY    . FRACTURE SURGERY    . JOINT REPLACEMENT      Allergies  Allergen Reactions  . Sulfa Antibiotics Rash      Medication List       Accurate as of 09/19/16  2:03 PM. Always use your most recent med list.          acetaminophen 325 MG tablet Commonly known as:  TYLENOL Take 650 mg by mouth every 6 (six) hours as needed for mild pain or moderate pain.   ALPRAZolam 0.25 MG tablet Commonly known as:  XANAX Take 1 tablet (0.25 mg total) by mouth 3 (three) times daily as needed for anxiety.   aspirin 81 MG chewable tablet Chew 81 mg by mouth every morning.   atorvastatin 80 MG tablet Commonly known as:  LIPITOR Take 80 mg by mouth daily.   docusate sodium 100 MG capsule Commonly known as:  COLACE Take 100 mg by mouth 2 (two) times daily.   HUMULIN N 100 UNIT/ML injection Generic drug:  insulin NPH Human Give 5 units subcutaneous twice a day   hydroxychloroquine 200 MG tablet Commonly known as:  PLAQUENIL Take  200 mg by mouth daily.   levalbuterol 0.63 MG/3ML nebulizer solution Commonly known as:  XOPENEX Take 3 mLs (0.63 mg total) by nebulization every 4 (four) hours as needed for wheezing or shortness of breath.   Melatonin 3 MG Tabs Take 2 tablets by mouth at bedtime   metoprolol succinate 25 MG 24 hr tablet Commonly known as:  TOPROL-XL Take 1.5 tablets (37.5 mg total) by mouth every morning.   nystatin cream Commonly known as:  MYCOSTATIN Apply to bottom rash QD   olopatadine 0.1 % ophthalmic solution Commonly known as:  PATANOL Place 1 drop into both eyes 2 (two) times daily.   omega-3 acid  ethyl esters 1 g capsule Commonly known as:  LOVAZA Take 2 g by mouth 2 (two) times daily.   oxyCODONE 5 MG immediate release tablet Commonly known as:  Oxy IR/ROXICODONE Take one tablet by mouth every 6 hours as needed for pain   OXYGEN Inhale 2 L into the lungs continuous. O 2 at 2 LPM via n/c to keep O2 sat above 90 % every shift   polyethylene glycol powder powder Commonly known as:  MIRALAX Take 17 g by mouth 2 (two) times daily.   predniSONE 2.5 MG tablet Commonly known as:  DELTASONE Take 3 tablets (7.5 mg total) by mouth daily with breakfast.   pregabalin 25 MG capsule Commonly known as:  LYRICA Take one capsule by mouth at bedtime   PRO-STAT 64 PO Take 30 mLs by mouth 2 (two) times daily.   REFRESH P.M. OP Apply 1 drop to eye daily as needed (FOR DY EYES). ONE APPLICATION BOTH EYES. For dry eyes not relieved by theratears every 6 hours   SANTYL ointment Generic drug:  collagenase Apply to sacral wound per tx orders   tamsulosin 0.4 MG Caps capsule Commonly known as:  FLOMAX Take 0.4 mg by mouth at bedtime.   THERA-M ENHANCED PO 1 tablet by mouth daily   THERATEARS 0.25 % Soln Generic drug:  Carboxymethylcellulose Sodium Apply 2 drops to both eyes three times a day   thiamine 100 MG tablet Commonly known as:  VITAMIN B-1 Take 100 mg by mouth daily.   torsemide 20 MG tablet Commonly known as:  DEMADEX Take 1 tablet (20 mg total) by mouth daily.   traZODone 50 MG tablet Commonly known as:  DESYREL Take 50 mg by mouth at bedtime.   vitamin C 500 MG tablet Commonly known as:  ASCORBIC ACID Take 500 mg by mouth daily.   zinc sulfate 220 (50 Zn) MG capsule Take 220 mg by mouth daily.       Review of Systems  Constitutional: Positive for fatigue and unexpected weight change. Negative for appetite change, chills, diaphoresis and fever.       According to wife patient has gaines 5 lbs but not sure if it was taken properly.  Respiratory: Negative for  apnea, choking, chest tightness, shortness of breath and wheezing.   Cardiovascular: Positive for leg swelling. Negative for chest pain and palpitations.  Gastrointestinal: Negative for abdominal distention, abdominal pain, anal bleeding, blood in stool, constipation and diarrhea.    There is no immunization history for the selected administration types on file for this patient. Pertinent  Health Maintenance Due  Topic Date Due  . FOOT EXAM  09/05/1961  . OPHTHALMOLOGY EXAM  09[birth year]05/1959  . URINE MICROALBUMIN  09/05/1959  . COLONOSCOPY  09/05/1999  . HEMOGLOBIN A1C  06/11/2013  . PNA vac Low Risk Adult (1  of 2 - PCV13) 09/04/2014  . INFLUENZA VACCINE  11/25/2016 (Originally 07/26/2016)   No flowsheet data found. Functional Status Survey:    There were no vitals filed for this visit. There is no height or weight on file to calculate BMI. Physical Exam  Constitutional: He is oriented to person, place, and time. He appears well-developed and well-nourished.  HENT:  Head: Normocephalic.  Cardiovascular: Normal rate and regular rhythm.   Pulmonary/Chest: Effort normal and breath sounds normal. No respiratory distress. He has no wheezes. He has no rales. He exhibits no tenderness.  Abdominal: Soft. Bowel sounds are normal. He exhibits no distension. There is no tenderness. There is no rebound and no guarding.  Musculoskeletal: He exhibits no edema.  Neurological: He is alert and oriented to person, place, and time.    Labs reviewed:  Recent Labs  08/26/16 0509  09/06/16 0623  09/07/16 0544  09/09/16 0656  09/13/16 0715 09/16/16 0500 09/19/16 0700  NA 133*  < > 133*  < > 134*  < > 134*  < > 135 135 135  K 3.3*  < > 3.8  < > 3.9  < > 3.9  < > 5.2* 5.2* 5.5*  CL 100*  < > 96*  < > 90*  < > 89*  < > 90* 92* 93*  CO2 29  < > 32  < > 33*  < > 36*  < > 38* 35* 32  GLUCOSE 79  < > 170*  < > 102*  < > 133*  < > 176* 139* 153*  BUN 16  < > 39*  < > 44*  < > 54*  < > 73* 77* 89*    CREATININE 1.36*  < > 1.66*  < > 1.87*  < > 1.97*  < > 2.08* 2.19* 2.17*  CALCIUM 8.0*  < > 8.0*  < > 8.3*  < > 8.2*  < > 8.6* 8.8* 8.6*  MG 2.5*  --   --   --   --   --  1.7  --   --   --   --   PHOS  --   < > 4.0  --  4.1  --  4.4  --   --   --   --   < > = values in this interval not displayed.  Recent Labs  08/27/16 0630  09/09/16 0656 09/13/16 0715 09/19/16 0700  AST 25  --   --  27 39  ALT 14*  --   --  20 42  ALKPHOS 211*  --   --  180* 303*  BILITOT 0.7  --   --  0.7 0.6  PROT 4.7*  --   --  6.1* 5.9*  ALBUMIN 1.8*  1.8*  < > 1.9* 2.2* 2.2*  < > = values in this interval not displayed.  Recent Labs  08/17/16 1700 08/26/16 0509  09/02/16 1559  09/05/16 0558 09/08/16 0546 09/13/16 0715  WBC 6.8 7.9  < > 10.1  < > 8.2 10.1 9.6  NEUTROABS 5.0 4.8  --  8.6*  --   --   --   --   HGB 9.2* 9.3*  < > 9.8*  < > 9.2* 9.4* 9.2*  HCT 29.9* 29.8*  < > 30.5*  < > 28.9* 28.7* 29.7*  MCV 101.0* 99.7  < > 96.2  < > 98.0 96.0 96.7  PLT 246 222  < > 248  < > 216 255  260  < > = values in this interval not displayed. Lab Results  Component Value Date   TSH 2.421 08/26/2016   Lab Results  Component Value Date   HGBA1C 6.9 (H) 12/11/2012   No results found for: CHOL, HDL, LDLCALC, LDLDIRECT, TRIG, CHOLHDL  Significant Diagnostic Results in last 30 days:  Ct Chest Wo Contrast  Result Date: 09/08/2016 CLINICAL DATA:  Short of breath, congestive heart failure. EXAM: CT CHEST WITHOUT CONTRAST TECHNIQUE: Multidetector CT imaging of the chest was performed following the standard protocol without IV contrast. COMPARISON:  CT scan 09/08/2016, 09/07/2016, chest radiograph FINDINGS: Cardiovascular: Coronary artery calcification and aortic atherosclerotic calcification. Midline sternotomy and CABG anatomy. No pericardial fluid Mediastinum/Nodes: No axillary or supraclavicular adenopathy. No mediastinal hilar adenopathy. No pericardial fluid. Lungs/Pleura: There is a moderate RIGHT effusion.  There is atelectasis with air bronchograms in the RIGHT lower lobe. Likely loculated pleural fluid along the RIGHT oblique fissure. Loculated fluid along the LEFT fissure additionally and small LEFT effusion. Upper lungs are relatively clear. No evidence of overt pulmonary edema. Upper Abdomen: Limited view of the liver, kidneys, pancreas are unremarkable. Normal adrenal glands. Gallstones noted in the gallbladder Musculoskeletal: No aggressive osseous lesion IMPRESSION: 1. Bilateral pleural effusions RIGHT greater than LEFT. Loculated fluid along the LEFT and RIGHT oblique fissures. 2. RIGHT lower lobe atelectasis versus infiltrate. Favor atelectasis. 3. No evidence of overt pulmonary edema. 4. Cardiomegaly Electronically Signed   By: Suzy Bouchard M.D.   On: 09/08/2016 20:20   Nm Pulmonary Perf And Vent  Result Date: 09/08/2016 CLINICAL DATA:  Short of breath, concern for pulmonary embolism. EXAM: NUCLEAR MEDICINE VENTILATION - PERFUSION LUNG SCAN TECHNIQUE: Ventilation images were obtained in multiple projections using inhaled aerosol Tc-59m DTPA. Perfusion images were obtained in multiple projections after intravenous injection of Tc-10m MAA. RADIOPHARMACEUTICALS:  Thirty-one mCi Technetium-12m DTPA aerosol inhalation and 3.9 mCi Technetium-35m MAA IV COMPARISON:  Radiograph 09/07/2016 FINDINGS: Ventilation: Decreased ventilation to the RIGHT lung base corresponds elevation of hemidiaphragm and effusion. Perfusion: No wedge shaped peripheral perfusion defects to suggest acute pulmonary embolism. Decreased perfusion to the RIGHT lung base corresponds elevated hemidiaphragm. IMPRESSION: 1. No evidence acute pulmonary embolism. 2. Decreased ventilation and profusion to the RIGHT lower lobe corresponds elevated hemidiaphragm and RIGHT effusion seen on comparison radiograph. Electronically Signed   By: Suzy Bouchard M.D.   On: 09/08/2016 17:43   Dg Chest Port 1 View  Result Date: 09/07/2016 CLINICAL  DATA:  Congestive heart failure EXAM: PORTABLE CHEST 1 VIEW COMPARISON:  09/02/2016 FINDINGS: Cardiomegaly again noted. Status post CABG. Slight worsening in aeration. Central vascular congestion and mild interstitial prominence bilaterally suspicious for pulmonary edema. Small right pleural effusion with right basilar atelectasis or infiltrate. Extensive degenerative changes bilateral shoulders. IMPRESSION: Slight worsening in aeration. Central vascular congestion and mild interstitial prominence bilaterally suspicious for pulmonary edema. Small right pleural effusion with right basilar atelectasis or infiltrate. Extensive degenerative changes bilateral shoulders. Electronically Signed   By: Lahoma Crocker M.D.   On: 09/07/2016 09:17   Dg Chest Portable 1 View  Result Date: 09/02/2016 CLINICAL DATA:  Cough. EXAM: PORTABLE CHEST 1 VIEW COMPARISON:  08/26/2016. FINDINGS: Dialysis catheter in stable position. Prior CABG. Cardiomegaly with diffuse bilateral pulmonary infiltrates again noted. Right pleural effusion. Findings consistent congestive heart failure. Similar findings noted on prior exam. IMPRESSION: 1. Dialysis catheter in stable position. 2. Prior CABG. Cardiomegaly with bilateral pulmonary infiltrates consistent with pulmonary edema. Right pleural effusion. Findings consistent with congestive heart failure. Bilateral  pneumonia cannot be excluded. Electronically Signed   By: Marcello Moores  Register   On: 09/02/2016 17:01   Dg Chest Port 1 View  Result Date: 08/26/2016 CLINICAL DATA:  Nonproductive cough, chest pressure and worsening dyspnea for 4 days EXAM: PORTABLE CHEST 1 VIEW COMPARISON:  04/04/2016 FINDINGS: There are pleural effusions bilaterally, right greater than left. There is hazy central airspace opacity which may represent alveolar edema. Mild pulmonary vascular prominence. Mild cardiomegaly. There is a dual-lumen left-sided central line extending into the right atrium. Incidental findings include  severe shoulder arthropathy bilaterally. IMPRESSION: Congestive heart failure with alveolar edema and/or right pleural effusion. Electronically Signed   By: Andreas Newport M.D.   On: 08/26/2016 05:25    Assessment/Plan  Worsening Chronic renal function with Hyperkalemia.  D/W his Nephrologist Continue Demadex at same dose Low potassium diet. Will follow labs again tomorrow. Patient has Follow up with nephrologist in 2 days. Continue to monitor as patient clinically is stable. Any  worsening will have to send to ER  Diabetes mellitus Patients wife was worried about his Blood sugars and wanted to know what his HGA1C is. His BS are running less then 200 in the facility. Will follow with HGA1c with Next Blood draw.       Family/ staff Communication:   Labs/tests ordered:

## 2016-09-19 NOTE — Telephone Encounter (Signed)
[facility] #[phone] Fax: [phone]   

## 2016-09-20 ENCOUNTER — Encounter (HOSPITAL_COMMUNITY)
Admission: RE | Admit: 2016-09-20 | Discharge: 2016-09-20 | Disposition: A | Discharge status: Home / Self Care | Payer: Medicare Other | Source: Skilled Nursing Facility | Attending: *Deleted | Admitting: *Deleted

## 2016-09-20 LAB — CBC WITH DIFFERENTIAL/PLATELET
BASOS ABS: 0.1 10*3/uL (ref 0.0–0.1)
BASOS PCT: 1 %
EOS PCT: 10 %
Eosinophils Absolute: 0.8 10*3/uL — ABNORMAL HIGH (ref 0.0–0.7)
HCT: 25.6 % — ABNORMAL LOW (ref 39.0–52.0)
Hemoglobin: 8.1 g/dL — ABNORMAL LOW (ref 13.0–17.0)
LYMPHS PCT: 32 %
Lymphs Abs: 2.7 10*3/uL (ref 0.7–4.0)
MCH: 30.3 pg (ref 26.0–34.0)
MCHC: 31.6 g/dL (ref 30.0–36.0)
MCV: 95.9 fL (ref 78.0–100.0)
MONO ABS: 0.5 10*3/uL (ref 0.1–1.0)
Monocytes Relative: 7 %
NEUTROS ABS: 4.3 10*3/uL (ref 1.7–7.7)
Neutrophils Relative %: 50 %
PLATELETS: 227 10*3/uL (ref 150–400)
RBC: 2.67 MIL/uL — AB (ref 4.22–5.81)
RDW: 15.8 % — AB (ref 11.5–15.5)
WBC: 8.4 10*3/uL (ref 4.0–10.5)

## 2016-09-20 LAB — RENAL FUNCTION PANEL
ALBUMIN: 2.3 g/dL — AB (ref 3.5–5.0)
ANION GAP: 10 (ref 5–15)
BUN: 88 mg/dL — AB (ref 6–20)
CALCIUM: 8.6 mg/dL — AB (ref 8.9–10.3)
CO2: 31 mmol/L (ref 22–32)
Chloride: 95 mmol/L — ABNORMAL LOW (ref 101–111)
Creatinine, Ser: 2.23 mg/dL — ABNORMAL HIGH (ref 0.61–1.24)
GFR calc Af Amer: 33 mL/min — ABNORMAL LOW (ref >60)
GFR, EST NON AFRICAN AMERICAN: 29 mL/min — AB (ref >60)
GLUCOSE: 138 mg/dL — AB (ref 65–99)
PHOSPHORUS: 5.8 mg/dL — AB (ref 2.5–4.6)
POTASSIUM: 4.9 mmol/L (ref 3.5–5.1)
SODIUM: 136 mmol/L (ref 135–145)

## 2016-09-21 ENCOUNTER — Other Ambulatory Visit (HOSPITAL_COMMUNITY)
Admission: RE | Admit: 2016-09-21 | Discharge: 2016-09-21 | Disposition: A | Discharge status: Home / Self Care | Payer: Medicare Other | Source: Skilled Nursing Facility | Attending: Internal Medicine | Admitting: Internal Medicine

## 2016-09-21 DIAGNOSIS — I509 Heart failure, unspecified: Secondary | ICD-10-CM | POA: Diagnosis not present

## 2016-09-21 DIAGNOSIS — N183 Chronic kidney disease, stage 3 (moderate): Secondary | ICD-10-CM | POA: Diagnosis not present

## 2016-09-21 DIAGNOSIS — E875 Hyperkalemia: Secondary | ICD-10-CM | POA: Diagnosis not present

## 2016-09-21 DIAGNOSIS — N250 Renal osteodystrophy: Secondary | ICD-10-CM | POA: Diagnosis not present

## 2016-09-21 LAB — OCCULT BLOOD X 1 CARD TO LAB, STOOL: FECAL OCCULT BLD: NEGATIVE

## 2016-09-22 ENCOUNTER — Encounter (HOSPITAL_COMMUNITY)
Admission: RE | Admit: 2016-09-22 | Discharge: 2016-09-22 | Disposition: A | Discharge status: Home / Self Care | Payer: Medicare Other | Source: Skilled Nursing Facility | Attending: *Deleted | Admitting: *Deleted

## 2016-09-22 DIAGNOSIS — Z89512 Acquired absence of left leg below knee: Secondary | ICD-10-CM | POA: Diagnosis not present

## 2016-09-22 DIAGNOSIS — E11622 Type 2 diabetes mellitus with other skin ulcer: Secondary | ICD-10-CM | POA: Diagnosis not present

## 2016-09-22 DIAGNOSIS — T8131XA Disruption of external operation (surgical) wound, not elsewhere classified, initial encounter: Secondary | ICD-10-CM | POA: Diagnosis not present

## 2016-09-22 DIAGNOSIS — S81802A Unspecified open wound, left lower leg, initial encounter: Secondary | ICD-10-CM | POA: Diagnosis not present

## 2016-09-22 LAB — OCCULT BLOOD X 1 CARD TO LAB, STOOL: Fecal Occult Bld: NEGATIVE

## 2016-09-24 ENCOUNTER — Encounter (HOSPITAL_COMMUNITY)
Admission: RE | Admit: 2016-09-24 | Discharge: 2016-09-24 | Disposition: A | Discharge status: Home / Self Care | Payer: Medicare Other | Attending: Internal Medicine | Admitting: Internal Medicine

## 2016-09-24 LAB — OCCULT BLOOD X 1 CARD TO LAB, STOOL: Fecal Occult Bld: NEGATIVE

## 2016-09-28 DIAGNOSIS — M6281 Muscle weakness (generalized): Secondary | ICD-10-CM | POA: Diagnosis not present

## 2016-09-28 DIAGNOSIS — R279 Unspecified lack of coordination: Secondary | ICD-10-CM | POA: Diagnosis not present

## 2016-09-28 DIAGNOSIS — Z89512 Acquired absence of left leg below knee: Secondary | ICD-10-CM | POA: Diagnosis not present

## 2016-09-28 DIAGNOSIS — R293 Abnormal posture: Secondary | ICD-10-CM | POA: Diagnosis not present

## 2016-09-28 DIAGNOSIS — M0609 Rheumatoid arthritis without rheumatoid factor, multiple sites: Secondary | ICD-10-CM | POA: Diagnosis not present

## 2016-09-29 DIAGNOSIS — M6281 Muscle weakness (generalized): Secondary | ICD-10-CM | POA: Diagnosis not present

## 2016-09-29 DIAGNOSIS — M0609 Rheumatoid arthritis without rheumatoid factor, multiple sites: Secondary | ICD-10-CM | POA: Diagnosis not present

## 2016-09-29 DIAGNOSIS — Z89512 Acquired absence of left leg below knee: Secondary | ICD-10-CM | POA: Diagnosis not present

## 2016-09-29 DIAGNOSIS — R279 Unspecified lack of coordination: Secondary | ICD-10-CM | POA: Diagnosis not present

## 2016-09-29 DIAGNOSIS — T8131XA Disruption of external operation (surgical) wound, not elsewhere classified, initial encounter: Secondary | ICD-10-CM | POA: Diagnosis not present

## 2016-09-29 DIAGNOSIS — R293 Abnormal posture: Secondary | ICD-10-CM | POA: Diagnosis not present

## 2016-10-03 ENCOUNTER — Encounter (HOSPITAL_COMMUNITY)
Admission: RE | Admit: 2016-10-03 | Discharge: 2016-10-03 | Disposition: A | Discharge status: Home / Self Care | Payer: Medicare Other | Source: Skilled Nursing Facility | Attending: Internal Medicine | Admitting: Internal Medicine

## 2016-10-03 DIAGNOSIS — N183 Chronic kidney disease, stage 3 (moderate): Secondary | ICD-10-CM | POA: Diagnosis not present

## 2016-10-03 LAB — CBC
HEMATOCRIT: 25 % — AB (ref 39.0–52.0)
Hemoglobin: 8 g/dL — ABNORMAL LOW (ref 13.0–17.0)
MCH: 30.5 pg (ref 26.0–34.0)
MCHC: 32 g/dL (ref 30.0–36.0)
MCV: 95.4 fL (ref 78.0–100.0)
PLATELETS: 192 10*3/uL (ref 150–400)
RBC: 2.62 MIL/uL — AB (ref 4.22–5.81)
RDW: 15.7 % — AB (ref 11.5–15.5)
WBC: 8.2 10*3/uL (ref 4.0–10.5)

## 2016-10-03 LAB — BASIC METABOLIC PANEL
Anion gap: 9 (ref 5–15)
BUN: 106 mg/dL — AB (ref 6–20)
CO2: 30 mmol/L (ref 22–32)
Calcium: 8.8 mg/dL — ABNORMAL LOW (ref 8.9–10.3)
Chloride: 99 mmol/L — ABNORMAL LOW (ref 101–111)
Creatinine, Ser: 2.44 mg/dL — ABNORMAL HIGH (ref 0.61–1.24)
GFR calc Af Amer: 30 mL/min — ABNORMAL LOW (ref >60)
GFR, EST NON AFRICAN AMERICAN: 26 mL/min — AB (ref >60)
GLUCOSE: 129 mg/dL — AB (ref 65–99)
POTASSIUM: 4.2 mmol/L (ref 3.5–5.1)
Sodium: 138 mmol/L (ref 135–145)

## 2016-10-04 ENCOUNTER — Encounter (HOSPITAL_COMMUNITY)
Admission: RE | Admit: 2016-10-04 | Discharge: 2016-10-04 | Disposition: A | Discharge status: Home / Self Care | Payer: Medicare Other | Source: Skilled Nursing Facility | Attending: *Deleted | Admitting: *Deleted

## 2016-10-04 DIAGNOSIS — H524 Presbyopia: Secondary | ICD-10-CM | POA: Diagnosis not present

## 2016-10-04 DIAGNOSIS — Z794 Long term (current) use of insulin: Secondary | ICD-10-CM | POA: Diagnosis not present

## 2016-10-04 DIAGNOSIS — Z7952 Long term (current) use of systemic steroids: Secondary | ICD-10-CM | POA: Diagnosis not present

## 2016-10-04 DIAGNOSIS — E119 Type 2 diabetes mellitus without complications: Secondary | ICD-10-CM | POA: Diagnosis not present

## 2016-10-04 DIAGNOSIS — H2513 Age-related nuclear cataract, bilateral: Secondary | ICD-10-CM | POA: Diagnosis not present

## 2016-10-04 LAB — HM DIABETES EYE EXAM

## 2016-10-05 ENCOUNTER — Other Ambulatory Visit: Payer: Self-pay | Admitting: *Deleted

## 2016-10-05 ENCOUNTER — Other Ambulatory Visit: Payer: Self-pay

## 2016-10-05 MED ORDER — PREGABALIN 25 MG PO CAPS: ORAL_CAPSULE | ORAL | 0 refills | Status: DC

## 2016-10-05 MED ORDER — OXYCODONE HCL 5 MG PO TABS: ORAL_TABLET | ORAL | 0 refills | Status: DC

## 2016-10-05 NOTE — Telephone Encounter (Signed)
RX faxed to [facility] @ [phone]. Phone number [phone]  

## 2016-10-05 NOTE — Telephone Encounter (Signed)
[facility] #[phone] Fax: [phone]   

## 2016-10-06 ENCOUNTER — Other Ambulatory Visit (HOSPITAL_COMMUNITY)
Admission: AD | Admit: 2016-10-06 | Discharge: 2016-10-06 | Disposition: A | Discharge status: Home / Self Care | Payer: Medicare Other | Source: Skilled Nursing Facility | Attending: Internal Medicine | Admitting: Internal Medicine

## 2016-10-06 DIAGNOSIS — E785 Hyperlipidemia, unspecified: Secondary | ICD-10-CM | POA: Diagnosis not present

## 2016-10-06 DIAGNOSIS — N401 Enlarged prostate with lower urinary tract symptoms: Secondary | ICD-10-CM | POA: Insufficient documentation

## 2016-10-06 DIAGNOSIS — Z4781 Encounter for orthopedic aftercare following surgical amputation: Secondary | ICD-10-CM | POA: Diagnosis not present

## 2016-10-06 DIAGNOSIS — T8131XA Disruption of external operation (surgical) wound, not elsewhere classified, initial encounter: Secondary | ICD-10-CM | POA: Diagnosis not present

## 2016-10-06 LAB — OCCULT BLOOD X 1 CARD TO LAB, STOOL: Fecal Occult Bld: NEGATIVE

## 2016-10-07 ENCOUNTER — Other Ambulatory Visit (HOSPITAL_COMMUNITY)
Admission: RE | Admit: 2016-10-07 | Discharge: 2016-10-07 | Disposition: A | Discharge status: Home / Self Care | Payer: Medicare Other | Source: Skilled Nursing Facility | Attending: Internal Medicine | Admitting: Internal Medicine

## 2016-10-07 DIAGNOSIS — Z4781 Encounter for orthopedic aftercare following surgical amputation: Secondary | ICD-10-CM | POA: Insufficient documentation

## 2016-10-07 DIAGNOSIS — E785 Hyperlipidemia, unspecified: Secondary | ICD-10-CM | POA: Insufficient documentation

## 2016-10-07 DIAGNOSIS — N401 Enlarged prostate with lower urinary tract symptoms: Secondary | ICD-10-CM | POA: Diagnosis not present

## 2016-10-07 DIAGNOSIS — R262 Difficulty in walking, not elsewhere classified: Secondary | ICD-10-CM | POA: Insufficient documentation

## 2016-10-07 LAB — OCCULT BLOOD X 1 CARD TO LAB, STOOL
Fecal Occult Bld: NEGATIVE
Fecal Occult Bld: NEGATIVE

## 2016-10-10 ENCOUNTER — Encounter: Payer: Self-pay | Admitting: Internal Medicine

## 2016-10-10 ENCOUNTER — Non-Acute Institutional Stay (SKILLED_NURSING_FACILITY): Payer: Medicare Other | Admitting: Internal Medicine

## 2016-10-10 DIAGNOSIS — L03119 Cellulitis of unspecified part of limb: Secondary | ICD-10-CM

## 2016-10-10 DIAGNOSIS — N179 Acute kidney failure, unspecified: Secondary | ICD-10-CM

## 2016-10-10 DIAGNOSIS — L02419 Cutaneous abscess of limb, unspecified: Secondary | ICD-10-CM | POA: Diagnosis not present

## 2016-10-10 DIAGNOSIS — I2510 Atherosclerotic heart disease of native coronary artery without angina pectoris: Secondary | ICD-10-CM | POA: Diagnosis not present

## 2016-10-10 DIAGNOSIS — I5043 Acute on chronic combined systolic (congestive) and diastolic (congestive) heart failure: Secondary | ICD-10-CM

## 2016-10-10 DIAGNOSIS — M069 Rheumatoid arthritis, unspecified: Secondary | ICD-10-CM

## 2016-10-10 DIAGNOSIS — N183 Chronic kidney disease, stage 3 (moderate): Secondary | ICD-10-CM

## 2016-10-10 NOTE — Progress Notes (Signed)
Location:   [facility] Room Number: 829/H Place of Service:  SNF (31) Provider:  Nancy Marus, MD  Patient Care Team: Lala Lund, MD as PCP - General (Internal Medicine)  Extended Emergency Contact Information Primary Emergency Contact: Morrissette,Martha Address: [address]          [place], [place] [address] [place] Phone: [phone] Mobile Phone: [phone] Relation: Spouse Secondary Emergency Contact: [place] Phone: [phone] Relation: Daughter  Code Status:  Full Code Goals of care: Advanced Directive information Advanced Directives 10/10/2016  Does patient have an advance directive? Yes  Type of Advance Directive (No Data)  Does patient want to make changes to advanced directive? No - Patient declined  Copy of advanced directive(s) in chart? Yes  Would patient like information on creating an advanced directive? -  Pre-existing out of facility DNR order (yellow form or pink MOST form) -     Chief Complaint  Patient presents with  . Acute Visit    Renal  Acute visit follow-up renal issues-and pain management.  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    HPI:  Pt is a 65 y.o. male seen today for an acute visit for pain management issues as well as follow-up of renal function.  Also medical management of above-stated issues.  Patient is a 65 year old male with very complex medical history of initially admitted here after hospitalization for a left below-knee amputation-this was secondary to an infection with bacteremia which eventually required the amputation.  He was rehospitalized with respiratory issues including  pneumonia and CHF-she also had SVT in the hospital this was controlled with beta blocker he continues on Toprol.  He also was treated with antibiotics for the pneumonia.  In regards to CHF he is on Demadex-this is complicated with his history of renal insufficiency and creatinine recently rose to higher end of baseline at 2.44 with a BUN of over 100-nephrology was contacted and recommended decreasing his dose of Demadex down to every other day.  He does have a significant history of rheumatoid arthritis C is on plaquenil  as well as low-dose prednisone-at one point had been on methadone but this was discontinued I believe secondary to issues with cardiac implications QT prolongation.  He is receiving oxycodone 5 mg every 6 hours when necessary and per discussion with nursing takes this apparently about 3 times a day-patient would like it scheduled routinely saying it would make his pain better controlled with the routine dosing since he is having pain with the when necessary dosing -- getting it apparently at times in a timely manner.  He does have a history of anemia of chronic disease as well as hemoglobin was 8.0 on lab done 10/03/2016 this is at the lower end of his recent baseline-occult blood testing so far has been negative.  He also has a history of weight loss although I suspect this may be fluid related with his history of CHF he did have significant diuresis while he was in the hospital.  He is apparently eating and drinking quite well.  Regards to history of SVT in the hospital this appears to have stabilized he is on Toprol.  Currently again his main complaint is arthritis related pain it appears and he would like changes made  to his medications in this regard.  In regards to diabetes  blood sugars appear to be fairly well controlled on the Humalog a 5 units twice a day morning sugars appear to be more in the mid 100s later day parts occasionally he will have spikes into the 200s I  suspect this may be appetite related speaking with nursing we will update a hemoglobin A1c.      Past Medical History:  Diagnosis Date  . Acute on chronic combined systolic and diastolic CHF (congestive heart failure) ([place]) 09/02/2016  . Chronic back pain   . Chronic neck pain   . Coronary artery disease 1993   angioplasty after AMI  . Diabetes mellitus   . Diabetic neuropathy ([place])   . Hyperkalemia   . Hypertension   . Loculated pleural effusion 09/09/2016  . Pancytopenia ([place]) 08/13/2014  . 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  . Rheumatoid arthritis(714.0)   . Seizure ([facility] [place]) 08/13/2014   Pt denies   Past Surgical History:  Procedure Laterality Date  . CARDIAC SURGERY    . FRACTURE SURGERY    . JOINT REPLACEMENT      Allergies  Allergen Reactions  . Sulfa Antibiotics Rash      Medication List       Accurate as of 10/10/16  2:24 PM. Always use your most recent med list.          acetaminophen 325 MG tablet Commonly known as:  TYLENOL Take 650 mg by mouth every 6 (six) hours as needed for mild pain or moderate pain.   ALPRAZolam 0.25 MG tablet Commonly known as:  XANAX Take 1 tablet (0.25 mg total) by mouth 3 (three) times daily as needed for anxiety.   aspirin 81 MG chewable tablet Chew 81 mg by mouth every morning.   atorvastatin 80 MG tablet Commonly known as:  LIPITOR Take 80 mg by mouth daily.   docusate sodium 100 MG capsule Commonly known as:  COLACE Take 100 mg by mouth 2 (two) times daily.   HUMULIN N 100 UNIT/ML injection Generic drug:  insulin NPH Human Give 5 units subcutaneous twice a day   hydroxychloroquine 200 MG tablet Commonly known as:  PLAQUENIL Take 200 mg by mouth daily.   levalbuterol 0.63 MG/3ML nebulizer solution Commonly known as:  XOPENEX Take 3 mLs (0.63 mg total) by nebulization every 4 (four) hours as needed for wheezing or shortness of breath.   Melatonin 3 MG  Tabs Take 2 tablets by mouth at bedtime   metoprolol succinate 25 MG 24 hr tablet Commonly known as:  TOPROL-XL Take 1.5 tablets (37.5 mg total) by mouth every morning.   nystatin cream Commonly known as:  MYCOSTATIN Apply to bottom rash QD   olopatadine 0.1 % ophthalmic solution Commonly known as:  PATANOL Place 1 drop into both eyes 2 (two) times daily.   omega-3 acid ethyl esters 1 g capsule Commonly known as:  LOVAZA Take 2 g by mouth 2 (two) times daily.   oxyCODONE 5 MG immediate release tablet Commonly known as:  Oxy IR/ROXICODONE Take one tablet by mouth every 6 hours as needed for pain   OXYGEN Inhale 2 L into the lungs continuous. O 2 at 2 LPM via n/c to keep O2 sat above 90 % every shift   polyethylene glycol powder powder Commonly known as:  MIRALAX Take 17 g by mouth 2 (two) times daily.   predniSONE 2.5 MG tablet Commonly known  as:  DELTASONE Take 3 tablets (7.5 mg total) by mouth daily with breakfast.   pregabalin 25 MG capsule Commonly known as:  LYRICA Take one capsule by mouth at bedtime   PRO-STAT 64 PO Take 30 mLs by mouth 2 (two) times daily.   REFRESH P.M. OP Apply 1 drop to eye daily as needed (FOR DY EYES). ONE APPLICATION BOTH EYES. For dry eyes not relieved by theratears every 6 hours   SANTYL ointment Generic drug:  collagenase Apply to residual surgical site per tx orders   tamsulosin 0.4 MG Caps capsule Commonly known as:  FLOMAX Take 0.4 mg by mouth at bedtime.   THERA-M ENHANCED PO 1 tablet by mouth daily   THERATEARS 0.25 % Soln Generic drug:  Carboxymethylcellulose Sodium Apply 2 drops to both eyes three times a day   thiamine 100 MG tablet Commonly known as:  VITAMIN B-1 Take 100 mg by mouth daily.   torsemide 10 MG tablet Commonly known as:  DEMADEX Give 20 mg by mouth once a day every other day   traZODone 50 MG tablet Commonly known as:  DESYREL Take 50 mg by mouth at bedtime.   VENELEX Oint Apply to sacrum q  shift & prn   vitamin C 500 MG tablet Commonly known as:  ASCORBIC ACID Take 500 mg by mouth daily.   zinc sulfate 220 (50 Zn) MG capsule Take 220 mg by mouth daily.       Review of Systems   In general is not complaining of any fever or chills.  Skin does have dressing over his amputation site left below the knee this is followed by wound care.  Head ears eyes nose mouth and throat does not complain of visual changes or sore throat.  Respiratory does not complain at this time of shortness of breath or cough again he does have a significant history here.  Cardiac does not complaining of chest pain or lower extremity edema.  GU does have a history of urinary retention has chronic indwelling Foley catheter.  GI is not complaining of abdominal discomfort says he has a good appetite does not complain of nausea vomiting diarrhea at times has had constipation but this appears controlled.  Musculoskeletal again this is the main issue today patient is complaining of joint pain with a history of rheumatoid arthritis   Neurologic is not complaining of dizziness headache does have a history of neuropathy with history of diabetes.  Psych he does have a history of anxiety although this appears relatively well controlled at this time      Immunization History  Administered Date(s) Administered  . Influenza-Unspecified 09/29/2016   Pertinent  Health Maintenance Due  Topic Date Due  . FOOT EXAM  09/05/1959  . OPHTHALMOLOGY EXAM  09/05/1959  . URINE MICROALBUMIN  09/05/1959  . COLONOSCOPY  09/05/1999  . HEMOGLOBIN A1C  06/11/2013  . PNA vac Low Risk Adult (1 of 2 - PCV13) 09/04/2014  . INFLUENZA VACCINE  Completed   No flowsheet data found. Functional Status Survey:    Vitals:   10/10/16 1405  BP: 122/81  Pulse: 67  Resp: 20  Temp: 97.2 F (36.2 C)  TempSrc: Oral   Weight is 147.8 this appears lost about 10 pounds over the past month-this is thought to be possibly  diuretic related his appetite is pretty good Physical Exam  Gen. this is a pleasant frail appearing male in no distress sitting comfortably in his [place] chair.  Skin is warm  and dry he does have dry dressing over amputation site left BKA  Eyes pupils appear reactive light sclera and conjunctiva are clear visual acuity appears grossly intact.  Oropharynx is clear mucous membranes moist.  Chest is clear to auscultation there is no labored breathing.  Heart is regular rate and rhythm without murmur gallop or rub he does not have significant lower extremity edema pedal pulse right foot is somewhat difficult to palpate.  Abdomen is soft nontender there are positive bowel sounds.  Musculoskeletal is status post left BKA-general frailty other extremities he has amputation of all toes on the right foot except the great toe-.  He has significant deformities of his fingers and hands bilaterally.  Neurologic is grossly intact his speech is clear no lateralizing findings.  Psych he is alert and oriented pleasant and appropriate        Labs reviewed:  Recent Labs  08/26/16 0509  09/07/16 0544  09/09/16 0656  09/19/16 0700 09/20/16 0500 10/03/16 0830  NA 133*  < > 134*  < > 134*  < > 135 136 138  K 3.3*  < > 3.9  < > 3.9  < > 5.5* 4.9 4.2  CL 100*  < > 90*  < > 89*  < > 93* 95* 99*  CO2 29  < > 33*  < > 36*  < > 32 31 30  GLUCOSE 79  < > 102*  < > 133*  < > 153* 138* 129*  BUN 16  < > 44*  < > 54*  < > 89* 88* 106*  CREATININE 1.36*  < > 1.87*  < > 1.97*  < > 2.17* 2.23* 2.44*  CALCIUM 8.0*  < > 8.3*  < > 8.2*  < > 8.6* 8.6* 8.8*  MG 2.5*  --   --   --  1.7  --   --   --   --   PHOS  --   < > 4.1  --  4.4  --   --  5.8*  --   < > = values in this interval not displayed.  Recent Labs  08/27/16 0630  09/13/16 0715 09/19/16 0700 09/20/16 0500  AST 25  --  27 39  --   ALT 14*  --  20 42  --   ALKPHOS 211*  --  180* 303*  --   BILITOT 0.7  --  0.7 0.6  --   PROT 4.7*  --   6.1* 5.9*  --   ALBUMIN 1.8*  1.8*  < > 2.2* 2.2* 2.3*  < > = values in this interval not displayed.  Recent Labs  08/26/16 0509  09/02/16 1559  09/13/16 0715 09/20/16 0500 10/03/16 0830  WBC 7.9  < > 10.1  < > 9.6 8.4 8.2  NEUTROABS 4.8  --  8.6*  --   --  4.3  --   HGB 9.3*  < > 9.8*  < > 9.2* 8.1* 8.0*  HCT 29.8*  < > 30.5*  < > 29.7* 25.6* 25.0*  MCV 99.7  < > 96.2  < > 96.7 95.9 95.4  PLT 222  < > 248  < > 260 227 192  < > = values in this interval not displayed. Lab Results  Component Value Date   TSH 2.421 08/26/2016   Lab Results  Component Value Date   HGBA1C 6.9 (H) 12/11/2012   No results found for: CHOL, HDL, LDLCALC, LDLDIRECT, TRIG, CHOLHDL  Significant Diagnostic Results in last 30 days:  No results found.  Assessment/Plan   #1 history of renal insufficiency at one point he actually required dialysis-this appeared to have stabilized somewhat he is followed by nephrology as well creatinine most recently was rising at 2.44 with a BUN of 106-will update lab tomorrow clinically actually appears to be stable in this regards.  #2 pain management with history of rheumatoid arthritis he continues on plaquenil as well as low-dose prednisone-he is on oxycodone 5 mg every 6 hours when necessary we will make this routine 3 times a day we may have to add a breakthrough agent as well but will see how effective the routine dosing as.  This was discussed with Dr. Lyndel Safe  #3 history of pneumonia this appears to have stabilized respiratory wise he does have when necessary Xopenex nebulizers when needed.  #4 history of diastolic and systolic CHF-he has lost some weight I suspect this is fluid related-respiratory status appears stable he is Demadex has been reduced to every other day secondary to his renal issues he is on 20 mg every other day again will await update metabolic panel-also continue to monitor weights.  #5 history of anemia of chronic disease hemoglobin of 8.0 was  at the lower end of his recent baseline Will update this at one point apparently he received Epogen a  . #6 history of diabetes type 2 he is on the Humulin N 5 units twice a day this appears fairly stable he does have some higher CBG readings above 200 at times and later day parts-talking with nursing staff this could very well be diet related-will update a hemoglobin A1c.  #6 history of SVT-this has not really been an issue during his stay here his heart rate appears to be controlled recently in the 60s he is on Toprol XL.  #7 history of hyperlipidemia he is on Lipitor as well as Lovaza will update a cholesterol panel he would like to be off the Lovaza if possible will await results of cholesterol panel and he is okay with that.  #8 history of peripheral neuropathy suspect diabetic related he is on Lyrica 25 mg a day.  #9 history of urinary retention he is on Flomax as well as chronic indwelling Foley catheter appears to be tolerating this well at this point does not complain of dysuria at this point will monitor.  #10 coronary artery disease this appears relatively stable no complaints chest pain he is on aspirin  And -a statin-  [id number]-of note greater than 40 minutes spent assessing patient-discussing patient's status with patient and his wife at [place] his chart-discussing his status with nursing-and coordinating and formulating a plan of care for numerous diagnoses-of note greater than 50% of time spent coordinating plan of care        Oralia Manis, [place]

## 2016-10-11 ENCOUNTER — Encounter (HOSPITAL_COMMUNITY)
Admission: RE | Admit: 2016-10-11 | Discharge: 2016-10-11 | Disposition: A | Discharge status: Home / Self Care | Payer: Medicare Other | Source: Skilled Nursing Facility | Attending: Internal Medicine | Admitting: Internal Medicine

## 2016-10-11 DIAGNOSIS — N183 Chronic kidney disease, stage 3 (moderate): Secondary | ICD-10-CM | POA: Diagnosis not present

## 2016-10-11 LAB — COMPREHENSIVE METABOLIC PANEL
ALT: 23 U/L (ref 17–63)
AST: 21 U/L (ref 15–41)
Albumin: 2.4 g/dL — ABNORMAL LOW (ref 3.5–5.0)
Alkaline Phosphatase: 295 U/L — ABNORMAL HIGH (ref 38–126)
Anion gap: 9 (ref 5–15)
BILIRUBIN TOTAL: 0.5 mg/dL (ref 0.3–1.2)
BUN: 97 mg/dL — AB (ref 6–20)
CALCIUM: 8.6 mg/dL — AB (ref 8.9–10.3)
CHLORIDE: 102 mmol/L (ref 101–111)
CO2: 27 mmol/L (ref 22–32)
CREATININE: 1.83 mg/dL — AB (ref 0.61–1.24)
GFR, EST AFRICAN AMERICAN: 42 mL/min — AB (ref >60)
GFR, EST NON AFRICAN AMERICAN: 37 mL/min — AB (ref >60)
Glucose, Bld: 114 mg/dL — ABNORMAL HIGH (ref 65–99)
Potassium: 4 mmol/L (ref 3.5–5.1)
Sodium: 138 mmol/L (ref 135–145)
TOTAL PROTEIN: 5.9 g/dL — AB (ref 6.5–8.1)

## 2016-10-11 LAB — CBC WITH DIFFERENTIAL/PLATELET
BASOS ABS: 0 10*3/uL (ref 0.0–0.1)
BASOS PCT: 1 %
EOS ABS: 1.1 10*3/uL — AB (ref 0.0–0.7)
Eosinophils Relative: 13 %
HEMATOCRIT: 24.4 % — AB (ref 39.0–52.0)
HEMOGLOBIN: 7.8 g/dL — AB (ref 13.0–17.0)
Lymphocytes Relative: 30 %
Lymphs Abs: 2.5 10*3/uL (ref 0.7–4.0)
MCH: 30.5 pg (ref 26.0–34.0)
MCHC: 32 g/dL (ref 30.0–36.0)
MCV: 95.3 fL (ref 78.0–100.0)
Monocytes Absolute: 0.5 10*3/uL (ref 0.1–1.0)
Monocytes Relative: 6 %
NEUTROS ABS: 4.3 10*3/uL (ref 1.7–7.7)
NEUTROS PCT: 50 %
Platelets: 151 10*3/uL (ref 150–400)
RBC: 2.56 MIL/uL — ABNORMAL LOW (ref 4.22–5.81)
RDW: 15.4 % (ref 11.5–15.5)
WBC: 8.4 10*3/uL (ref 4.0–10.5)

## 2016-10-11 LAB — LIPID PANEL
CHOLESTEROL: 118 mg/dL (ref 0–200)
HDL: 39 mg/dL — AB (ref >40)
LDL CALC: 70 mg/dL (ref 0–99)
TRIGLYCERIDES: 44 mg/dL (ref <150)
Total CHOL/HDL Ratio: 3 RATIO
VLDL: 9 mg/dL (ref 0–40)

## 2016-10-12 LAB — HEMOGLOBIN A1C
Hgb A1c MFr Bld: 6.1 % — ABNORMAL HIGH (ref 4.8–5.6)
MEAN PLASMA GLUCOSE: 128 mg/dL

## 2016-10-13 DIAGNOSIS — T8131XA Disruption of external operation (surgical) wound, not elsewhere classified, initial encounter: Secondary | ICD-10-CM | POA: Diagnosis not present

## 2016-10-15 ENCOUNTER — Inpatient Hospital Stay (HOSPITAL_COMMUNITY)
Admission: EM | Admit: 2016-10-15 | Discharge: 2016-10-22 | DRG: 871 | Disposition: A | Discharge status: Home Health | Payer: Medicare Other | Attending: Internal Medicine | Admitting: Internal Medicine

## 2016-10-15 ENCOUNTER — Encounter (HOSPITAL_COMMUNITY): Payer: Self-pay

## 2016-10-15 ENCOUNTER — Emergency Department (HOSPITAL_COMMUNITY): Payer: Medicare Other

## 2016-10-15 DIAGNOSIS — E785 Hyperlipidemia, unspecified: Secondary | ICD-10-CM | POA: Diagnosis present

## 2016-10-15 DIAGNOSIS — E1140 Type 2 diabetes mellitus with diabetic neuropathy, unspecified: Secondary | ICD-10-CM | POA: Diagnosis present

## 2016-10-15 DIAGNOSIS — E1122 Type 2 diabetes mellitus with diabetic chronic kidney disease: Secondary | ICD-10-CM | POA: Diagnosis present

## 2016-10-15 DIAGNOSIS — R740 Nonspecific elevation of levels of transaminase and lactic acid dehydrogenase [LDH]: Secondary | ICD-10-CM | POA: Diagnosis present

## 2016-10-15 DIAGNOSIS — N183 Chronic kidney disease, stage 3 (moderate): Secondary | ICD-10-CM | POA: Diagnosis present

## 2016-10-15 DIAGNOSIS — M069 Rheumatoid arthritis, unspecified: Secondary | ICD-10-CM | POA: Diagnosis present

## 2016-10-15 DIAGNOSIS — E1151 Type 2 diabetes mellitus with diabetic peripheral angiopathy without gangrene: Secondary | ICD-10-CM | POA: Diagnosis present

## 2016-10-15 DIAGNOSIS — A4152 Sepsis due to Pseudomonas: Principal | ICD-10-CM | POA: Diagnosis present

## 2016-10-15 DIAGNOSIS — K5900 Constipation, unspecified: Secondary | ICD-10-CM | POA: Diagnosis present

## 2016-10-15 DIAGNOSIS — Z833 Family history of diabetes mellitus: Secondary | ICD-10-CM | POA: Diagnosis not present

## 2016-10-15 DIAGNOSIS — N390 Urinary tract infection, site not specified: Secondary | ICD-10-CM

## 2016-10-15 DIAGNOSIS — M542 Cervicalgia: Secondary | ICD-10-CM | POA: Diagnosis present

## 2016-10-15 DIAGNOSIS — E1129 Type 2 diabetes mellitus with other diabetic kidney complication: Secondary | ICD-10-CM

## 2016-10-15 DIAGNOSIS — Z89512 Acquired absence of left leg below knee: Secondary | ICD-10-CM

## 2016-10-15 DIAGNOSIS — Z79891 Long term (current) use of opiate analgesic: Secondary | ICD-10-CM

## 2016-10-15 DIAGNOSIS — K6289 Other specified diseases of anus and rectum: Secondary | ICD-10-CM | POA: Diagnosis not present

## 2016-10-15 DIAGNOSIS — R279 Unspecified lack of coordination: Secondary | ICD-10-CM | POA: Diagnosis not present

## 2016-10-15 DIAGNOSIS — I2510 Atherosclerotic heart disease of native coronary artery without angina pectoris: Secondary | ICD-10-CM | POA: Diagnosis present

## 2016-10-15 DIAGNOSIS — A419 Sepsis, unspecified organism: Secondary | ICD-10-CM

## 2016-10-15 DIAGNOSIS — N400 Enlarged prostate without lower urinary tract symptoms: Secondary | ICD-10-CM | POA: Diagnosis present

## 2016-10-15 DIAGNOSIS — T8789 Other complications of amputation stump: Secondary | ICD-10-CM | POA: Diagnosis present

## 2016-10-15 DIAGNOSIS — N3289 Other specified disorders of bladder: Secondary | ICD-10-CM | POA: Diagnosis present

## 2016-10-15 DIAGNOSIS — J189 Pneumonia, unspecified organism: Secondary | ICD-10-CM | POA: Diagnosis not present

## 2016-10-15 DIAGNOSIS — Z7952 Long term (current) use of systemic steroids: Secondary | ICD-10-CM | POA: Diagnosis not present

## 2016-10-15 DIAGNOSIS — R7989 Other specified abnormal findings of blood chemistry: Secondary | ICD-10-CM

## 2016-10-15 DIAGNOSIS — G8929 Other chronic pain: Secondary | ICD-10-CM | POA: Diagnosis present

## 2016-10-15 DIAGNOSIS — Z794 Long term (current) use of insulin: Secondary | ICD-10-CM

## 2016-10-15 DIAGNOSIS — J90 Pleural effusion, not elsewhere classified: Secondary | ICD-10-CM

## 2016-10-15 DIAGNOSIS — D638 Anemia in other chronic diseases classified elsewhere: Secondary | ICD-10-CM | POA: Diagnosis present

## 2016-10-15 DIAGNOSIS — Z882 Allergy status to sulfonamides status: Secondary | ICD-10-CM

## 2016-10-15 DIAGNOSIS — Z7982 Long term (current) use of aspirin: Secondary | ICD-10-CM

## 2016-10-15 DIAGNOSIS — N289 Disorder of kidney and ureter, unspecified: Secondary | ICD-10-CM | POA: Diagnosis not present

## 2016-10-15 DIAGNOSIS — M549 Dorsalgia, unspecified: Secondary | ICD-10-CM | POA: Diagnosis present

## 2016-10-15 DIAGNOSIS — Y95 Nosocomial condition: Secondary | ICD-10-CM | POA: Diagnosis present

## 2016-10-15 DIAGNOSIS — Z8249 Family history of ischemic heart disease and other diseases of the circulatory system: Secondary | ICD-10-CM

## 2016-10-15 DIAGNOSIS — Y838 Other surgical procedures as the cause of abnormal reaction of the patient, or of later complication, without mention of misadventure at the time of the procedure: Secondary | ICD-10-CM | POA: Diagnosis present

## 2016-10-15 DIAGNOSIS — D649 Anemia, unspecified: Secondary | ICD-10-CM

## 2016-10-15 DIAGNOSIS — E119 Type 2 diabetes mellitus without complications: Secondary | ICD-10-CM | POA: Diagnosis not present

## 2016-10-15 DIAGNOSIS — I130 Hypertensive heart and chronic kidney disease with heart failure and stage 1 through stage 4 chronic kidney disease, or unspecified chronic kidney disease: Secondary | ICD-10-CM | POA: Diagnosis present

## 2016-10-15 DIAGNOSIS — Z743 Need for continuous supervision: Secondary | ICD-10-CM | POA: Diagnosis not present

## 2016-10-15 DIAGNOSIS — Z79899 Other long term (current) drug therapy: Secondary | ICD-10-CM | POA: Diagnosis not present

## 2016-10-15 DIAGNOSIS — Z823 Family history of stroke: Secondary | ICD-10-CM

## 2016-10-15 DIAGNOSIS — I5042 Chronic combined systolic (congestive) and diastolic (congestive) heart failure: Secondary | ICD-10-CM | POA: Diagnosis present

## 2016-10-15 DIAGNOSIS — D509 Iron deficiency anemia, unspecified: Secondary | ICD-10-CM | POA: Diagnosis present

## 2016-10-15 LAB — URINALYSIS, ROUTINE W REFLEX MICROSCOPIC
BILIRUBIN URINE: NEGATIVE
GLUCOSE, UA: NEGATIVE mg/dL
Ketones, ur: NEGATIVE mg/dL
Nitrite: POSITIVE — AB
PROTEIN: 100 mg/dL — AB
Specific Gravity, Urine: 1.015 (ref 1.005–1.030)
pH: 6 (ref 5.0–8.0)

## 2016-10-15 LAB — COMPREHENSIVE METABOLIC PANEL
ALK PHOS: 390 U/L — AB (ref 38–126)
ALT: 40 U/L (ref 17–63)
ANION GAP: 9 (ref 5–15)
AST: 38 U/L (ref 15–41)
Albumin: 2.7 g/dL — ABNORMAL LOW (ref 3.5–5.0)
BILIRUBIN TOTAL: 0.7 mg/dL (ref 0.3–1.2)
BUN: 83 mg/dL — ABNORMAL HIGH (ref 6–20)
CALCIUM: 8.9 mg/dL (ref 8.9–10.3)
CO2: 27 mmol/L (ref 22–32)
CREATININE: 2.08 mg/dL — AB (ref 0.61–1.24)
Chloride: 101 mmol/L (ref 101–111)
GFR, EST AFRICAN AMERICAN: 36 mL/min — AB (ref >60)
GFR, EST NON AFRICAN AMERICAN: 31 mL/min — AB (ref >60)
Glucose, Bld: 182 mg/dL — ABNORMAL HIGH (ref 65–99)
Potassium: 4.6 mmol/L (ref 3.5–5.1)
SODIUM: 137 mmol/L (ref 135–145)
TOTAL PROTEIN: 6.8 g/dL (ref 6.5–8.1)

## 2016-10-15 LAB — CBC WITH DIFFERENTIAL/PLATELET
Basophils Absolute: 0 10*3/uL (ref 0.0–0.1)
Basophils Relative: 0 %
EOS ABS: 1.8 10*3/uL — AB (ref 0.0–0.7)
Eosinophils Relative: 10 %
HEMATOCRIT: 28.9 % — AB (ref 39.0–52.0)
HEMOGLOBIN: 9.1 g/dL — AB (ref 13.0–17.0)
LYMPHS ABS: 3.1 10*3/uL (ref 0.7–4.0)
LYMPHS PCT: 17 %
MCH: 30.2 pg (ref 26.0–34.0)
MCHC: 31.5 g/dL (ref 30.0–36.0)
MCV: 96 fL (ref 78.0–100.0)
MONOS PCT: 2 %
Monocytes Absolute: 0.4 10*3/uL (ref 0.1–1.0)
NEUTROS PCT: 71 %
Neutro Abs: 13.1 10*3/uL — ABNORMAL HIGH (ref 1.7–7.7)
Platelets: 199 10*3/uL (ref 150–400)
RBC: 3.01 MIL/uL — AB (ref 4.22–5.81)
RDW: 15.5 % (ref 11.5–15.5)
WBC: 18.5 10*3/uL — AB (ref 4.0–10.5)

## 2016-10-15 LAB — GLUCOSE, CAPILLARY
GLUCOSE-CAPILLARY: 175 mg/dL — AB (ref 65–99)
GLUCOSE-CAPILLARY: 216 mg/dL — AB (ref 65–99)
GLUCOSE-CAPILLARY: 138 mg/dL — AB (ref 65–99)

## 2016-10-15 LAB — I-STAT CG4 LACTIC ACID, ED
LACTIC ACID, VENOUS: 2.32 mmol/L — AB (ref 0.5–1.9)
Lactic Acid, Venous: 1.19 mmol/L (ref 0.5–1.9)

## 2016-10-15 LAB — URINE MICROSCOPIC-ADD ON: SQUAMOUS EPITHELIAL / LPF: NONE SEEN

## 2016-10-15 MED ORDER — PREGABALIN 25 MG PO CAPS
25.0000 mg | ORAL_CAPSULE | Freq: Every day | ORAL | Status: DC
  Administered 2016-10-15 – 2016-10-22 (×7): 25 mg via ORAL
  Filled 2016-10-15 (×8): qty 1

## 2016-10-15 MED ORDER — HYDROXYCHLOROQUINE SULFATE 200 MG PO TABS
200.0000 mg | ORAL_TABLET | Freq: Every day | ORAL | Status: DC
  Administered 2016-10-15 – 2016-10-22 (×7): 200 mg via ORAL
  Filled 2016-10-15 (×10): qty 1

## 2016-10-15 MED ORDER — HYDROMORPHONE HCL 1 MG/ML IJ SOLN
1.0000 mg | Freq: Once | INTRAMUSCULAR | Status: AC
  Administered 2016-10-15: 1 mg via INTRAVENOUS
  Filled 2016-10-15: qty 1

## 2016-10-15 MED ORDER — SODIUM CHLORIDE 0.9 % IV SOLN
INTRAVENOUS | Status: DC
  Administered 2016-10-15: 08:00:00 via INTRAVENOUS
  Filled 2016-10-15 (×7): qty 1000

## 2016-10-15 MED ORDER — SODIUM CHLORIDE 0.9 % IV BOLUS (SEPSIS)
1000.0000 mL | Freq: Once | INTRAVENOUS | Status: AC
  Administered 2016-10-15: 1000 mL via INTRAVENOUS

## 2016-10-15 MED ORDER — OLOPATADINE HCL 0.1 % OP SOLN
1.0000 [drp] | Freq: Two times a day (BID) | OPHTHALMIC | Status: DC
Start: 2016-10-15 — End: 2016-10-23
  Administered 2016-10-15 – 2016-10-21 (×13): 1 [drp] via OPHTHALMIC
  Filled 2016-10-15: qty 5

## 2016-10-15 MED ORDER — ENOXAPARIN SODIUM 40 MG/0.4ML ~~LOC~~ SOLN
40.0000 mg | SUBCUTANEOUS | Status: DC
  Administered 2016-10-15 – 2016-10-22 (×8): 40 mg via SUBCUTANEOUS
  Filled 2016-10-15 (×9): qty 0.4

## 2016-10-15 MED ORDER — ONDANSETRON HCL 4 MG/2ML IJ SOLN
4.0000 mg | Freq: Once | INTRAMUSCULAR | Status: AC
  Administered 2016-10-15: 4 mg via INTRAVENOUS

## 2016-10-15 MED ORDER — ASPIRIN 81 MG PO CHEW
81.0000 mg | CHEWABLE_TABLET | Freq: Every morning | ORAL | Status: DC
  Administered 2016-10-15 – 2016-10-22 (×7): 81 mg via ORAL
  Filled 2016-10-15 (×8): qty 1

## 2016-10-15 MED ORDER — SODIUM CHLORIDE 0.9 % IV BOLUS (SEPSIS): 500.0000 mL | Freq: Once | INTRAVENOUS | Status: DC

## 2016-10-15 MED ORDER — VANCOMYCIN HCL IN DEXTROSE 1-5 GM/200ML-% IV SOLN
1000.0000 mg | Freq: Once | INTRAVENOUS | Status: AC
  Administered 2016-10-15: 1000 mg via INTRAVENOUS
  Filled 2016-10-15: qty 200

## 2016-10-15 MED ORDER — VITAMIN B-1 100 MG PO TABS
100.0000 mg | ORAL_TABLET | Freq: Every day | ORAL | Status: DC
  Administered 2016-10-15 – 2016-10-22 (×7): 100 mg via ORAL
  Filled 2016-10-15 (×8): qty 1

## 2016-10-15 MED ORDER — DOCUSATE SODIUM 100 MG PO CAPS
100.0000 mg | ORAL_CAPSULE | Freq: Two times a day (BID) | ORAL | Status: DC
Start: 2016-10-15 — End: 2016-10-23
  Administered 2016-10-15 – 2016-10-22 (×14): 100 mg via ORAL
  Filled 2016-10-15 (×15): qty 1

## 2016-10-15 MED ORDER — ONDANSETRON HCL 4 MG/2ML IJ SOLN
INTRAMUSCULAR | Status: AC
  Filled 2016-10-15: qty 2

## 2016-10-15 MED ORDER — INSULIN ASPART 100 UNIT/ML ~~LOC~~ SOLN
0.0000 [IU] | Freq: Three times a day (TID) | SUBCUTANEOUS | Status: DC
  Administered 2016-10-15 – 2016-10-17 (×2): 2 [IU] via SUBCUTANEOUS
  Administered 2016-10-17: 3 [IU] via SUBCUTANEOUS
  Administered 2016-10-17: 1 [IU] via SUBCUTANEOUS
  Administered 2016-10-18 (×2): 2 [IU] via SUBCUTANEOUS
  Administered 2016-10-19: 1 [IU] via SUBCUTANEOUS
  Administered 2016-10-19: 3 [IU] via SUBCUTANEOUS
  Administered 2016-10-19: 2 [IU] via SUBCUTANEOUS
  Administered 2016-10-20 (×3): 1 [IU] via SUBCUTANEOUS
  Administered 2016-10-21 (×2): 2 [IU] via SUBCUTANEOUS
  Administered 2016-10-22: 5 [IU] via SUBCUTANEOUS
  Administered 2016-10-22 (×2): 3 [IU] via SUBCUTANEOUS

## 2016-10-15 MED ORDER — ATORVASTATIN CALCIUM 40 MG PO TABS
80.0000 mg | ORAL_TABLET | Freq: Every day | ORAL | Status: DC
  Administered 2016-10-15 – 2016-10-22 (×7): 80 mg via ORAL
  Filled 2016-10-15 (×5): qty 2
  Filled 2016-10-15: qty 4
  Filled 2016-10-15 (×2): qty 2

## 2016-10-15 MED ORDER — HYDROCODONE-ACETAMINOPHEN 5-325 MG PO TABS
1.0000 | ORAL_TABLET | Freq: Four times a day (QID) | ORAL | Status: DC | PRN
  Administered 2016-10-15 – 2016-10-18 (×7): 1 via ORAL
  Filled 2016-10-15 (×7): qty 1

## 2016-10-15 MED ORDER — PROMETHAZINE HCL 25 MG/ML IJ SOLN
12.5000 mg | Freq: Once | INTRAMUSCULAR | Status: AC
  Administered 2016-10-15: 12.5 mg via INTRAVENOUS

## 2016-10-15 MED ORDER — TAMSULOSIN HCL 0.4 MG PO CAPS
0.4000 mg | ORAL_CAPSULE | Freq: Every day | ORAL | Status: DC
  Administered 2016-10-15 – 2016-10-22 (×8): 0.4 mg via ORAL
  Filled 2016-10-15 (×8): qty 1

## 2016-10-15 MED ORDER — ZINC SULFATE 220 (50 ZN) MG PO CAPS
220.0000 mg | ORAL_CAPSULE | Freq: Every day | ORAL | Status: DC
  Administered 2016-10-15 – 2016-10-22 (×7): 220 mg via ORAL
  Filled 2016-10-15 (×8): qty 1

## 2016-10-15 MED ORDER — PRO-STAT SUGAR FREE PO LIQD
30.0000 mL | Freq: Two times a day (BID) | ORAL | Status: DC
  Administered 2016-10-15 – 2016-10-22 (×11): 30 mL via ORAL
  Filled 2016-10-15 (×13): qty 30

## 2016-10-15 MED ORDER — ALPRAZOLAM 0.25 MG PO TABS
0.2500 mg | ORAL_TABLET | Freq: Three times a day (TID) | ORAL | Status: DC | PRN
  Administered 2016-10-16 – 2016-10-19 (×5): 0.25 mg via ORAL
  Filled 2016-10-15 (×5): qty 1

## 2016-10-15 MED ORDER — OMEGA-3-ACID ETHYL ESTERS 1 G PO CAPS
2.0000 g | ORAL_CAPSULE | Freq: Two times a day (BID) | ORAL | Status: DC
  Administered 2016-10-15 – 2016-10-22 (×15): 2 g via ORAL
  Filled 2016-10-15 (×16): qty 2

## 2016-10-15 MED ORDER — POLYETHYLENE GLYCOL 3350 17 G PO PACK
17.0000 g | PACK | Freq: Two times a day (BID) | ORAL | Status: DC
  Administered 2016-10-15 – 2016-10-22 (×12): 17 g via ORAL
  Filled 2016-10-15 (×13): qty 1

## 2016-10-15 MED ORDER — IOPAMIDOL (ISOVUE-300) INJECTION 61%
INTRAVENOUS | Status: AC
  Filled 2016-10-15: qty 30

## 2016-10-15 MED ORDER — ACETAMINOPHEN 325 MG PO TABS
650.0000 mg | ORAL_TABLET | Freq: Four times a day (QID) | ORAL | Status: DC | PRN
  Administered 2016-10-15 – 2016-10-16 (×3): 650 mg via ORAL
  Filled 2016-10-15 (×3): qty 2

## 2016-10-15 MED ORDER — DEXTROSE 5 % IV SOLN
2.0000 g | Freq: Once | INTRAVENOUS | Status: AC
  Administered 2016-10-15: 2 g via INTRAVENOUS
  Filled 2016-10-15: qty 2

## 2016-10-15 MED ORDER — VANCOMYCIN HCL 10 G IV SOLR
1250.0000 mg | INTRAVENOUS | Status: DC
  Administered 2016-10-15 – 2016-10-16 (×2): 1250 mg via INTRAVENOUS
  Filled 2016-10-15 (×3): qty 1250

## 2016-10-15 MED ORDER — VITAMIN C 500 MG PO TABS
500.0000 mg | ORAL_TABLET | Freq: Every day | ORAL | Status: DC
  Administered 2016-10-15 – 2016-10-22 (×7): 500 mg via ORAL
  Filled 2016-10-15 (×8): qty 1

## 2016-10-15 MED ORDER — PROMETHAZINE HCL 25 MG/ML IJ SOLN
INTRAMUSCULAR | Status: AC
  Filled 2016-10-15: qty 1

## 2016-10-15 MED ORDER — METOPROLOL SUCCINATE ER 25 MG PO TB24
37.5000 mg | ORAL_TABLET | Freq: Every morning | ORAL | Status: DC
  Administered 2016-10-15 – 2016-10-22 (×7): 37.5 mg via ORAL
  Filled 2016-10-15 (×8): qty 2

## 2016-10-15 MED ORDER — DEXTROSE 5 % IV SOLN
2.0000 g | INTRAVENOUS | Status: DC
  Administered 2016-10-16: 2 g via INTRAVENOUS
  Filled 2016-10-15 (×2): qty 2

## 2016-10-15 MED ORDER — PREDNISONE 5 MG PO TABS
7.5000 mg | ORAL_TABLET | Freq: Every day | ORAL | Status: DC
  Administered 2016-10-15 – 2016-10-22 (×7): 7.5 mg via ORAL
  Filled 2016-10-15 (×8): qty 2

## 2016-10-15 MED ORDER — POLYETHYLENE GLYCOL 3350 17 GM/SCOOP PO POWD
17.0000 g | Freq: Two times a day (BID) | ORAL | Status: DC
  Filled 2016-10-15: qty 255

## 2016-10-15 MED ORDER — PROCHLORPERAZINE EDISYLATE 5 MG/ML IJ SOLN
10.0000 mg | Freq: Once | INTRAMUSCULAR | Status: AC
  Administered 2016-10-15: 10 mg via INTRAVENOUS
  Filled 2016-10-15: qty 2

## 2016-10-15 NOTE — Progress Notes (Signed)
Pharmacy Antibiotic Note  [name] is a 65 y.o. male admitted on 10/15/2016 with sepsis/ UTI/Pneumonia.   Pharmacy has been consulted for cefepime and vancomycin dosing.  Plan: Cefepime 2gm IV q24h Vancomycin 1gm IV given this AM at 0500, continue with 1250mg  IV q24h. Goal trough 15-64mcg/ml F/U cultures, V/S and monitor clinical progress Vancomycin trough levels as indicated  Weight: 180 lb 12.4 oz (82 kg)  Temp (24hrs), Avg:99.5 F (37.5 C), Min:97.6 F (36.4 C), Max:101.1 F (38.4 C)   Recent Labs Lab 10/11/16 0500 10/15/16 0219 10/15/16 0234 10/15/16 0605  WBC 8.4 18.5*  --   --   CREATININE 1.83* 2.08*  --   --   LATICACIDVEN  --   --  2.32* 1.19    Estimated Creatinine Clearance: 34.5 mL/min (by C-G formula based on SCr of 2.08 mg/dL (H)).    Allergies  Allergen Reactions  . Sulfa Antibiotics Rash    Antimicrobials this admission: Vancomycin 10/21 >>  Cefepime 10/21 >>   Dose adjustments this admission: n/a  Microbiology results: 10/21 BCx: pending 10/21 UCx: pending 10/21 Sputum: pending  08/26/16  MRSA PCR: negative  Thank you for allowing pharmacy to be a part of this patient's care.  Isac Sarna, BS Pharm D, [place] Clinical Pharmacist Pager [phone] 10/15/2016 7:41 AM

## 2016-10-15 NOTE — ED Triage Notes (Signed)
Pt is resident of [facility], brought in by ems for rectal pain onset tonight.  Family request pt be evaluated for same

## 2016-10-15 NOTE — H&P (Signed)
TRH H&P    Patient Demographics:    [name], is a 65 y.o. male  MRN: [id number]  DOB - [birth year]  Admit Date - 10/15/2016  Referring MD/NP/PA: Dr. Roxanne Mins  Outpatient Primary MD for the patient is JONAS, Archer Asa, MD  Patient coming from: Skilled nursing facility  Chief Complaint  Patient presents with  . Rectal Pain      HPI:    [name]  is a 65 y.o. male, with history of diabetes mellitus, combined CHF, CAD status post CABG,  chronic kidney disease stage III requiring intermittent hemodialysis, currently off dialysis, steroid dependent rheumatoid arthritis, recent long hospitalization at [facility] for complications from peripheral vascular disease status post left BKA, was brought to the hospital from [facility] center for rectal pain, bladder spasm.  Patient was recently discharged from [facility] on 09/09/2016 after he was treated for healthcare associated pneumonia, at that time patient was discharged with Foley catheter as per patient's wife. He also has chronic wound on left stump, and has been followed by orthopedics and wound care at [facility] center.  Today in the ED patient found to have WBC 18,000, temperature 101, lactate 2.32.  UA showed positive nitrite, too numerous to count WBC CT scan of the abdomen and pelvis showed moderate to large amount of retained large stool without bowel obstruction, also showed small residual right pleural effusion with pleural thickening. Right lower lobe focal pneumonia. Patient empirically started on vancomycin and cefepime per pharmacy consultation.  At this time patient is very somnolent after he got Dilaudid and Phenergan in the ED, and unable to provide any significant history. As per wife, he did not have any chest pain, no shortness of breath. No nausea vomiting or diarrhea.   Review of systems:     Unable to obtain as patient is somnolent   With Past  History of the following :    Past Medical History:  Diagnosis Date  . Acute on chronic combined systolic and diastolic CHF (congestive heart failure) ([place]) 09/02/2016  . Chronic back pain   . Chronic neck pain   . Coronary artery disease 1993   angioplasty after AMI  . Diabetes mellitus   . Diabetic neuropathy ([place])   . Hyperkalemia   . Hypertension   . Loculated pleural effusion 09/09/2016  . Pancytopenia ([place]) 08/13/2014  . 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  . Rheumatoid arthritis(714.0)   . Seizure ([place]) 08/13/2014   Pt denies      Past Surgical History:  Procedure Laterality Date  . CARDIAC SURGERY    . FRACTURE SURGERY    . JOINT REPLACEMENT        Social History:      Social History  Substance Use Topics  . Smoking status: Never Smoker  . Smokeless tobacco: Never Used  . Alcohol use No       Family History :     Family History  Problem Relation Age of Onset  . Diabetes Father   .  Dementia Father   . Heart attack Brother     multiple brothers with MIs  . Hypertension Mother   . Stroke Mother       Home Medications:   Prior to Admission medications   Medication Sig Start Date End Date Taking? Authorizing Provider  acetaminophen (TYLENOL) 325 MG tablet Take 650 mg by mouth every 6 (six) hours as needed for mild pain or moderate pain.     Historical Provider, MD  ALPRAZolam Duanne Moron) 0.25 MG tablet Take 1 tablet (0.25 mg total) by mouth 3 (three) times daily as needed for anxiety. 09/09/16   Rexene Alberts, MD  Amino Acids-Protein Hydrolys (PRO-STAT 64 PO) Take 30 mLs by mouth 2 (two) times daily.    Historical Provider, MD  Artificial Tear Ointment (REFRESH P.M. OP) Apply 1 drop to eye daily as needed (FOR DY EYES). ONE APPLICATION BOTH EYES. For dry eyes not relieved by theratears every 6 hours     Historical Provider, MD  aspirin 81 MG chewable tablet Chew 81 mg by mouth every morning.     Historical  Provider, MD  atorvastatin (LIPITOR) 80 MG tablet Take 80 mg by mouth daily.    Historical Provider, MD  Janne Lab Oil [facility]) OINT Apply to sacrum q shift & prn    Historical Provider, MD  Carboxymethylcellulose Sodium (THERATEARS) 0.25 % SOLN Apply 2 drops to both eyes three times a day    Historical Provider, MD  collagenase (SANTYL) ointment Apply to residual surgical site per tx orders    Historical Provider, MD  docusate sodium (COLACE) 100 MG capsule Take 100 mg by mouth 2 (two) times daily.    Historical Provider, MD  hydroxychloroquine (PLAQUENIL) 200 MG tablet Take 200 mg by mouth daily.     Historical Provider, MD  insulin NPH Human (HUMULIN N) 100 UNIT/ML injection Give 5 units subcutaneous twice a day    Historical Provider, MD  levalbuterol (XOPENEX) 0.63 MG/3ML nebulizer solution Take 3 mLs (0.63 mg total) by nebulization every 4 (four) hours as needed for wheezing or shortness of breath. 09/08/16   Rexene Alberts, MD  Melatonin 3 MG TABS Take 2 tablets by mouth at bedtime    Historical Provider, MD  metoprolol succinate (TOPROL-XL) 25 MG 24 hr tablet Take 1.5 tablets (37.5 mg total) by mouth every morning. 09/08/16   Rexene Alberts, MD  Multiple Vitamins-Minerals (THERA-M ENHANCED PO) 1 tablet by mouth daily    Historical Provider, MD  nystatin cream (MYCOSTATIN) Apply to bottom rash QD    Historical Provider, MD  olopatadine (PATANOL) 0.1 % ophthalmic solution Place 1 drop into both eyes 2 (two) times daily.    Historical Provider, MD  omega-3 acid ethyl esters (LOVAZA) 1 g capsule Take 2 g by mouth 2 (two) times daily.    Historical Provider, MD  oxyCODONE (OXY IR/ROXICODONE) 5 MG immediate release tablet Take one tablet by mouth every 6 hours as needed for pain 10/05/16   Estill Dooms, MD  OXYGEN Inhale 2 L into the lungs continuous. O 2 at 2 LPM via n/c to keep O2 sat above 90 % every shift     Historical Provider, MD  polyethylene glycol powder (MIRALAX) powder Take 17  g by mouth 2 (two) times daily. 08/14/14   Rexene Alberts, MD  predniSONE (DELTASONE) 2.5 MG tablet Take 3 tablets (7.5 mg total) by mouth daily with breakfast. 08/28/16   Orvan Falconer, MD  pregabalin (LYRICA) 25 MG capsule Take one capsule  by mouth at bedtime 10/05/16   Estill Dooms, MD  Tamsulosin HCl (FLOMAX) 0.4 MG CAPS Take 0.4 mg by mouth at bedtime.     Historical Provider, MD  thiamine (VITAMIN B-1) 100 MG tablet Take 100 mg by mouth daily.    Historical Provider, MD  torsemide (DEMADEX) 10 MG tablet Give 20 mg by mouth once a day every other day    Historical Provider, MD  traZODone (DESYREL) 50 MG tablet Take 50 mg by mouth at bedtime.    Historical Provider, MD  vitamin C (ASCORBIC ACID) 500 MG tablet Take 500 mg by mouth daily.    Historical Provider, MD  zinc sulfate 220 (50 Zn) MG capsule Take 220 mg by mouth daily.    Historical Provider, MD     Allergies:     Allergies  Allergen Reactions  . Sulfa Antibiotics Rash     Physical Exam:   Vitals  Blood pressure 119/62, pulse 103, temperature 99.7 F (37.6 C), temperature source Oral, resp. rate 19, SpO2 100 %.  1.  General: Appears very somnolent  2. Psychiatric: Somnolent but easily arousable  3. Neurologic: Not tested.  4. Eyes :  anicteric sclerae, moist conjunctivae with no lid lag.  5. ENMT:  Oropharynx clear with moist mucous membranes and good dentition  6. Neck:  supple, no cervical lymphadenopathy appriciated, No thyromegaly  7. Respiratory : Normal respiratory effort, good air movement bilaterally,clear to  auscultation bilaterally  8. Cardiovascular : RRR, no gallops, grade 3/6 systolic murmur at mitral area, no leg edema  9. Gastrointestinal:  Positive bowel sounds, abdomen soft, non-tender to palpation,no hepatosplenomegaly, no rigidity or guarding       10. Skin:  Left stump in dressing  11.Musculoskeletal:  Status post left BKA    Data Review:    CBC  Recent Labs Lab 10/11/16 0500  10/15/16 0219  WBC 8.4 18.5*  HGB 7.8* 9.1*  HCT 24.4* 28.9*  PLT 151 199  MCV 95.3 96.0  MCH 30.5 30.2  MCHC 32.0 31.5  RDW 15.4 15.5  LYMPHSABS 2.5 3.1  MONOABS 0.5 0.4  EOSABS 1.1* 1.8*  BASOSABS 0.0 0.0   ------------------------------------------------------------------------------------------------------------------  Chemistries   Recent Labs Lab 10/11/16 0500 10/15/16 0219  NA 138 137  K 4.0 4.6  CL 102 101  CO2 27 27  GLUCOSE 114* 182*  BUN 97* 83*  CREATININE 1.83* 2.08*  CALCIUM 8.6* 8.9  AST 21 38  ALT 23 40  ALKPHOS 295* 390*  BILITOT 0.5 0.7   ------------------------------------------------------------------------------------------------------------------  ------------------------------------------------------------------------------------------------------------------ GFR: CrCl cannot be calculated (Unknown ideal weight.). Liver Function Tests:  Recent Labs Lab 10/11/16 0500 10/15/16 0219  AST 21 38  ALT 23 40  ALKPHOS 295* 390*  BILITOT 0.5 0.7  PROT 5.9* 6.8  ALBUMIN 2.4* 2.7*    --------------------------------------------------------------------------------------------------------------- Urine analysis:    Component Value Date/Time   COLORURINE YELLOW 10/15/2016 0152   APPEARANCEUR HAZY (A) 10/15/2016 0152   LABSPEC 1.015 10/15/2016 0152   PHURINE 6.0 10/15/2016 0152   GLUCOSEU NEGATIVE 10/15/2016 0152   HGBUR LARGE (A) 10/15/2016 0152   BILIRUBINUR NEGATIVE 10/15/2016 0152   KETONESUR NEGATIVE 10/15/2016 0152   PROTEINUR 100 (A) 10/15/2016 0152   UROBILINOGEN 0.2 08/12/2014 2358   NITRITE POSITIVE (A) 10/15/2016 0152   LEUKOCYTESUR LARGE (A) 10/15/2016 0152      Imaging Results:    Ct Abdomen Pelvis Wo Contrast  Result Date: 10/15/2016 CLINICAL DATA:  Rectal pain tonight. History of diabetes, acute renal  failure, loculated pleural effusion. EXAM: CT ABDOMEN AND PELVIS WITHOUT CONTRAST TECHNIQUE: Multidetector CT  imaging of the abdomen and pelvis was performed following the standard protocol without IV contrast. Oral contrast administered. COMPARISON:  CT chest September 08, 2016 FINDINGS: LOWER CHEST: Small residual RIGHT pleural effusion with thickened pleura and RIGHT lower lobe consolidation. Tree-in-bud infiltrates through the included bilateral lungs. Resolution of LEFT pleural effusion with residual LEFT lung base atelectasis/scarring. Heart size is normal. Severe coronary artery calcifications. Status post median sternotomy. HEPATOBILIARY: Multiple tiny layering gallstones without CT findings of acute cholecystitis. Noncontrast liver is normal. PANCREAS: Predominate fatty replaced pancreas. SPLEEN: Normal. ADRENALS/URINARY TRACT: Kidneys are orthotopic, demonstrating normal size and morphology. Severe vascular calcifications. No nephrolithiasis, hydronephrosis; limited assessment for renal masses on this nonenhanced examination. The unopacified ureters are normal in course and caliber. Urinary bladder decompressed containing a Foley catheter and retaining bulb. Normal adrenal glands. STOMACH/BOWEL: The stomach, small and large bowel are normal in course and caliber without inflammatory changes. Enteric contrast has not yet reached the distal small bowel. Moderate to large amount of retained large bowel stool. Mild circumferential rectal wall thickening without fat stranding suggesting internal hemorrhoids. Small bowel feces compatible chronic stasis. Normal appendix. VASCULAR/LYMPHATIC: Aortoiliac vessels are normal in course and caliber, severe atherosclerosis. No lymphadenopathy by CT size criteria. REPRODUCTIVE: Normal. OTHER: No intraperitoneal free fluid or free air. MUSCULOSKELETAL: Non-acute. Moderate fat containing umbilical hernia. Moderate LEFT fat containing inguinal hernia. Status post LEFT hip total arthroplasty resulting in streak artifact. Moderate degenerative change of the RIGHT hip. Severe L3-4 disc  height loss, endplate irregularity compatible with degenerative disc resulting in severe in L3-4 neural foraminal narrowing. Moderate L2-3 degenerative disc. IMPRESSION: Moderate to large amount of retained large bowel stool without bowel obstruction or acute intra-abdominal/ pelvic process. Suspected internal hemorrhoids. Small residual RIGHT pleural effusion with pleural thickening, empyema not excluded. RIGHT lower lobe focal pneumonia. Diffuse tree-in-bud infiltrates may be infectious or inflammatory. Severe atherosclerosis. Electronically Signed   By: Elon Alas M.D.   On: 10/15/2016 05:02    My personal review of EKG: Rhythm NSR, QTc 478   Assessment & Plan:    Active Problems:   Insulin dependent diabetes mellitus ([place])   HCAP (healthcare-associated pneumonia)   Urinary tract infection without hematuria   Sepsis ([place])   1. Sepsis- patient presented with fever, tachycardia, leukocytosis, elevated lactate with possible source of infection from urine, as well as lung. We'll start patient on vancomycin and cefepime per pharmacy consultation, obtain blood cultures, urine culture. Follow lactate levels. Patient is not hypotensive, so was  given IV fluid bolus as per sepsis protocol. 2. Healthcare associated pneumonia- CT scan abdomen showed right pleural effusion with pleural thickening, empyema not excluded also showed right lower lobe focal pneumonia. Antibiotics has been started as above. 3. ? UTI- patient has chronic indwelling Foley catheter which was placed during previous admission at [facility], today UA is abnormal. Follow urine cultures. Antibiotics started as above. Consider discontinuing Foley catheter, I did not find any indication for continuing Foley catheter. 4. Rectal pain/bladder spasm- likely from chronic Foley catheter, constipation. Continue MiraLAX 17 g twice a day. Vicodin when necessary for pain 5. Diabetes mellitus- started sliding scale insulin with  NovoLog 6. Chronic left BKA wound- wound care consulted 7. Chronic kidney disease stage III- patient's BUN/ creatinine, is at baseline. He has received intermittent hemodialysis in the past, currently he is off hemodialysis. 8. Chronic systolic and diastolic CHF- currently compensated, will need  to closely monitor intake and output as patient started on IV normal saline at 75 mL per hour. He did receive  2.5 L fluid bolus in the ED. 9. Rheumatoid arthritis- currently treated with chronic prednisone and Plaquenil, stable.    DVT Prophylaxis-   Lovenox   AM Labs Ordered, also please review Full Orders  Family Communication: Admission, patients condition and plan of care including tests being ordered have been discussed with the patient and his wife at bedside who indicate understanding and agree with the plan and Code Status.  Code Status: Full code  Admission status: Inpatient    Time spent in minutes : 60 minutes   Nikkia Devoss S M.D on 10/15/2016 at 6:12 AM  Between 7am to 7pm - Pager - [phone]. After 7pm go to [email or web address] - password [facility]  [facility] - Office  [phone]

## 2016-10-15 NOTE — ED Provider Notes (Signed)
[facility] DEPT Provider Note   CSN: [id number] Arrival date & time: 10/15/16  0111     History   Chief Complaint Chief Complaint  Patient presents with  . Rectal Pain    HPI [name] is a 65 y.o. male.  Complicated medical history including diabetes, hypertension, prolonged QT interval, pancytopenia, rheumatoid arthritis. He has had an indwelling Foley catheter for about one month. At about 8:30 PM, he started having severe pain in the perineal and rectal area along with a sense that he has to urinate. He states that he actually has been having the sense of having to urinate fairly frequently while the catheter has been in place. He denies fever, chills, sweats. There has been nausea and vomiting. He has been constipated and has been given suppositories without any benefit. He rates pain at 10/10. He had been given 2 doses of oxycodone without relief.   The history is provided by the patient and the spouse.    Past Medical History:  Diagnosis Date  . Acute on chronic combined systolic and diastolic CHF (congestive heart failure) ([place]) 09/02/2016  . Chronic back pain   . Chronic neck pain   . Coronary artery disease 1993   angioplasty after AMI  . Diabetes mellitus   . Diabetic neuropathy ([place])   . Hyperkalemia   . Hypertension   . Loculated pleural effusion 09/09/2016  . Pancytopenia ([place]) 08/13/2014  . 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  . Rheumatoid arthritis(714.0)   . Seizure ([place]) 08/13/2014   Pt denies    Patient Active Problem List   Diagnosis Date Noted  . Loculated pleural effusion 09/09/2016  . Shortness of breath 09/08/2016  . Acute on chronic combined systolic and diastolic CHF (congestive heart failure) ([place]) 09/02/2016  . Acute renal failure superimposed on stage 3 chronic kidney disease ([place]) 09/02/2016  . HCAP (healthcare-associated pneumonia) 09/02/2016  . Hyperkalemia 09/02/2016  . Sacral  decubitus ulcer 09/02/2016  . Pressure ulcer 08/27/2016  . ESRD needing dialysis ([place]) 08/26/2016  . Acute hematogenous osteomyelitis of left foot ([place]) 08/09/2016  . Cellulitis and abscess of lower extremity 05/29/2016  . Anemia of chronic disease 04/27/2016  . Status post amputation of toe of left foot ([place]) 04/27/2016  . Systolic heart failure ([place]) 07/09/2015  . Bradycardia 08/14/2014  . Prolonged QT interval 08/14/2014  . Gait disorder 08/14/2014  . Seizure ([place]) 08/13/2014  . Pancytopenia ([place]) 08/13/2014  . ARF (acute renal failure) ([place]) 08/13/2014  . Altered mental status 06/18/2014  . Closed dislocation of metatarsophalangeal (joint) 03/26/2013  . Insulin dependent diabetes mellitus ([place]) 12/11/2012  . Osteomyelitis of metatarsal ([place]) 12/11/2012  . Hyperlipidemia 12/11/2012  . Coronary artery disease   . Rheumatoid arthritis ([place])   . Hypertension     Past Surgical History:  Procedure Laterality Date  . CARDIAC SURGERY    . FRACTURE SURGERY    . JOINT REPLACEMENT         Home Medications    Prior to Admission medications   Medication Sig Start Date End Date Taking? Authorizing Provider  acetaminophen (TYLENOL) 325 MG tablet Take 650 mg by mouth every 6 (six) hours as needed for mild pain or moderate pain.     Historical Provider, MD  ALPRAZolam Duanne Moron) 0.25 MG tablet Take 1 tablet (0.25 mg total) by mouth 3 (three) times daily as needed for anxiety. 09/09/16   Rexene Alberts, MD  Amino Acids-Protein Hydrolys (PRO-STAT 64 PO)  Take 30 mLs by mouth 2 (two) times daily.    Historical Provider, MD  Artificial Tear Ointment (REFRESH P.M. OP) Apply 1 drop to eye daily as needed (FOR DY EYES). ONE APPLICATION BOTH EYES. For dry eyes not relieved by theratears every 6 hours     Historical Provider, MD  aspirin 81 MG chewable tablet Chew 81 mg by mouth every morning.     Historical Provider, MD  atorvastatin (LIPITOR) 80 MG tablet Take 80 mg by mouth daily.    Historical  Provider, MD  Janne Lab Oil [facility]) OINT Apply to sacrum q shift & prn    Historical Provider, MD  Carboxymethylcellulose Sodium (THERATEARS) 0.25 % SOLN Apply 2 drops to both eyes three times a day    Historical Provider, MD  collagenase (SANTYL) ointment Apply to residual surgical site per tx orders    Historical Provider, MD  docusate sodium (COLACE) 100 MG capsule Take 100 mg by mouth 2 (two) times daily.    Historical Provider, MD  hydroxychloroquine (PLAQUENIL) 200 MG tablet Take 200 mg by mouth daily.     Historical Provider, MD  insulin NPH Human (HUMULIN N) 100 UNIT/ML injection Give 5 units subcutaneous twice a day    Historical Provider, MD  levalbuterol (XOPENEX) 0.63 MG/3ML nebulizer solution Take 3 mLs (0.63 mg total) by nebulization every 4 (four) hours as needed for wheezing or shortness of breath. 09/08/16   Rexene Alberts, MD  Melatonin 3 MG TABS Take 2 tablets by mouth at bedtime    Historical Provider, MD  metoprolol succinate (TOPROL-XL) 25 MG 24 hr tablet Take 1.5 tablets (37.5 mg total) by mouth every morning. 09/08/16   Rexene Alberts, MD  Multiple Vitamins-Minerals (THERA-M ENHANCED PO) 1 tablet by mouth daily    Historical Provider, MD  nystatin cream (MYCOSTATIN) Apply to bottom rash QD    Historical Provider, MD  olopatadine (PATANOL) 0.1 % ophthalmic solution Place 1 drop into both eyes 2 (two) times daily.    Historical Provider, MD  omega-3 acid ethyl esters (LOVAZA) 1 g capsule Take 2 g by mouth 2 (two) times daily.    Historical Provider, MD  oxyCODONE (OXY IR/ROXICODONE) 5 MG immediate release tablet Take one tablet by mouth every 6 hours as needed for pain 10/05/16   Estill Dooms, MD  OXYGEN Inhale 2 L into the lungs continuous. O 2 at 2 LPM via n/c to keep O2 sat above 90 % every shift     Historical Provider, MD  polyethylene glycol powder (MIRALAX) powder Take 17 g by mouth 2 (two) times daily. 08/14/14   Rexene Alberts, MD  predniSONE (DELTASONE) 2.5 MG  tablet Take 3 tablets (7.5 mg total) by mouth daily with breakfast. 08/28/16   Orvan Falconer, MD  pregabalin (LYRICA) 25 MG capsule Take one capsule by mouth at bedtime 10/05/16   Estill Dooms, MD  Tamsulosin HCl (FLOMAX) 0.4 MG CAPS Take 0.4 mg by mouth at bedtime.     Historical Provider, MD  thiamine (VITAMIN B-1) 100 MG tablet Take 100 mg by mouth daily.    Historical Provider, MD  torsemide (DEMADEX) 10 MG tablet Give 20 mg by mouth once a day every other day    Historical Provider, MD  traZODone (DESYREL) 50 MG tablet Take 50 mg by mouth at bedtime.    Historical Provider, MD  vitamin C (ASCORBIC ACID) 500 MG tablet Take 500 mg by mouth daily.    Historical Provider, MD  zinc  sulfate 220 (50 Zn) MG capsule Take 220 mg by mouth daily.    Historical Provider, MD    Family History Family History  Problem Relation Age of Onset  . Diabetes Father   . Dementia Father   . Heart attack Brother     multiple brothers with MIs  . Hypertension Mother   . Stroke Mother     Social History Social History  Substance Use Topics  . Smoking status: Never Smoker  . Smokeless tobacco: Never Used  . Alcohol use No     Allergies   Sulfa antibiotics   Review of Systems Review of Systems  All other systems reviewed and are negative.    Physical Exam Updated Vital Signs BP 122/75 (BP Location: Left Arm)   Pulse 79   Temp 97.6 F (36.4 C) (Oral)   Resp 20   SpO2 100%   Physical Exam  Nursing note and vitals reviewed.  65 year old male, resting comfortably and in no acute distress. Vital signs are Normal. Oxygen saturation is 100%, which is normal. Head is normocephalic and atraumatic. PERRLA, EOMI. Oropharynx is clear. Neck is nontender and supple without adenopathy or JVD. Back is nontender and there is no CVA tenderness. Lungs are clear without rales, wheezes, or rhonchi. Chest is nontender. Heart has regular rate and rhythm without murmur. Abdomen is soft, flat, nontender without  masses or hepatosplenomegaly and peristalsis is hypoactive. Small umbilical hernia present which is easily reducible. Genitalia: Circumcised penis with a Foley catheter in place. Testes descended without masses. There is no erythema or swelling in the scrotum or testes. Rectal: Decreased sphincter tone. Empty rectal ampulla. Extremities have no cyanosis or edema. Deformity of long-standing rheumatoid arthritis present - most obvious in the hands. Skin is warm and dry without rash. Neurologic: Mental status is normal, cranial nerves are intact, there are no gross motor or sensory deficits.  ED Treatments / Results  Labs (all labs ordered are listed, but only abnormal results are displayed) Labs Reviewed  CBC WITH DIFFERENTIAL/PLATELET - Abnormal; Notable for the following:       Result Value   WBC 18.5 (*)    RBC 3.01 (*)    Hemoglobin 9.1 (*)    HCT 28.9 (*)    Neutro Abs 13.1 (*)    Eosinophils Absolute 1.8 (*)    All other components within normal limits  COMPREHENSIVE METABOLIC PANEL - Abnormal; Notable for the following:    Glucose, Bld 182 (*)    BUN 83 (*)    Creatinine, Ser 2.08 (*)    Albumin 2.7 (*)    Alkaline Phosphatase 390 (*)    GFR calc non Af Amer 31 (*)    GFR calc Af Amer 36 (*)    All other components within normal limits  URINALYSIS, ROUTINE W REFLEX MICROSCOPIC (NOT AT [facility]) - Abnormal; Notable for the following:    APPearance HAZY (*)    Hgb urine dipstick LARGE (*)    Protein, ur 100 (*)    Nitrite POSITIVE (*)    Leukocytes, UA LARGE (*)    All other components within normal limits  URINE MICROSCOPIC-ADD ON - Abnormal; Notable for the following:    Bacteria, UA MANY (*)    All other components within normal limits  I-STAT CG4 LACTIC ACID, ED - Abnormal; Notable for the following:    Lactic Acid, Venous 2.32 (*)    All other components within normal limits  CULTURE, BLOOD (ROUTINE X 2)  CULTURE, BLOOD (ROUTINE X 2)  URINE CULTURE  I-STAT CG4 LACTIC  ACID, ED    EKG  EKG Interpretation  Date/Time:  Saturday October 15 2016 02:03:37 EDT Ventricular Rate:  85 PR Interval:    QRS Duration: 112 QT Interval:  404 QTC Calculation: 478 R Axis:   -2 Text Interpretation:  Sinus rhythm Incomplete right bundle branch block Abnormal R-wave progression, late transition ST elevation, consider inferior injury Borderline prolonged QT interval Artifact in lead(s) I II III aVR aVL aVF V3 V4 V6 and baseline wander in lead(s) V3 When compared with ECG of 09/08/2016, QT has shortened Premature ventricular complexes are no longer Present Confirmed by Roxanne Mins  MD, Leonela Kivi ([id number]) on 10/15/2016 2:13:26 AM       Radiology Ct Abdomen Pelvis Wo Contrast  Result Date: 10/15/2016 CLINICAL DATA:  Rectal pain tonight. History of diabetes, acute renal failure, loculated pleural effusion. EXAM: CT ABDOMEN AND PELVIS WITHOUT CONTRAST TECHNIQUE: Multidetector CT imaging of the abdomen and pelvis was performed following the standard protocol without IV contrast. Oral contrast administered. COMPARISON:  CT chest September 08, 2016 FINDINGS: LOWER CHEST: Small residual RIGHT pleural effusion with thickened pleura and RIGHT lower lobe consolidation. Tree-in-bud infiltrates through the included bilateral lungs. Resolution of LEFT pleural effusion with residual LEFT lung base atelectasis/scarring. Heart size is normal. Severe coronary artery calcifications. Status post median sternotomy. HEPATOBILIARY: Multiple tiny layering gallstones without CT findings of acute cholecystitis. Noncontrast liver is normal. PANCREAS: Predominate fatty replaced pancreas. SPLEEN: Normal. ADRENALS/URINARY TRACT: Kidneys are orthotopic, demonstrating normal size and morphology. Severe vascular calcifications. No nephrolithiasis, hydronephrosis; limited assessment for renal masses on this nonenhanced examination. The unopacified ureters are normal in course and caliber. Urinary bladder decompressed  containing a Foley catheter and retaining bulb. Normal adrenal glands. STOMACH/BOWEL: The stomach, small and large bowel are normal in course and caliber without inflammatory changes. Enteric contrast has not yet reached the distal small bowel. Moderate to large amount of retained large bowel stool. Mild circumferential rectal wall thickening without fat stranding suggesting internal hemorrhoids. Small bowel feces compatible chronic stasis. Normal appendix. VASCULAR/LYMPHATIC: Aortoiliac vessels are normal in course and caliber, severe atherosclerosis. No lymphadenopathy by CT size criteria. REPRODUCTIVE: Normal. OTHER: No intraperitoneal free fluid or free air. MUSCULOSKELETAL: Non-acute. Moderate fat containing umbilical hernia. Moderate LEFT fat containing inguinal hernia. Status post LEFT hip total arthroplasty resulting in streak artifact. Moderate degenerative change of the RIGHT hip. Severe L3-4 disc height loss, endplate irregularity compatible with degenerative disc resulting in severe in L3-4 neural foraminal narrowing. Moderate L2-3 degenerative disc. IMPRESSION: Moderate to large amount of retained large bowel stool without bowel obstruction or acute intra-abdominal/ pelvic process. Suspected internal hemorrhoids. Small residual RIGHT pleural effusion with pleural thickening, empyema not excluded. RIGHT lower lobe focal pneumonia. Diffuse tree-in-bud infiltrates may be infectious or inflammatory. Severe atherosclerosis. Electronically Signed   By: Elon Alas M.D.   On: 10/15/2016 05:02    Procedures Procedures (including critical care time) CRITICAL CARE Performed by: AOZHY,QMVHQ Total critical care time: 40 minutes Critical care time was exclusive of separately billable procedures and treating other patients. Critical care was necessary to treat or prevent imminent or life-threatening deterioration. Critical care was time spent personally by me on the following activities: development  of treatment plan with patient and/or surrogate as well as nursing, discussions with consultants, evaluation of patient's response to treatment, examination of patient, obtaining history from patient or surrogate, ordering and performing treatments and interventions, ordering and review of laboratory  studies, ordering and review of radiographic studies, pulse oximetry and re-evaluation of patient's condition.  Medications Ordered in ED Medications  iopamidol (ISOVUE-300) 61 % injection (not administered)  sodium chloride 0.9 % bolus 1,000 mL (0 mLs Intravenous Stopped 10/15/16 0531)    And  sodium chloride 0.9 % bolus 1,000 mL (1,000 mLs Intravenous New Bag/Given 10/15/16 0522)    And  sodium chloride 0.9 % bolus 500 mL (not administered)  vancomycin (VANCOCIN) IVPB 1000 mg/200 mL premix (not administered)  prochlorperazine (COMPAZINE) injection 10 mg (10 mg Intravenous Given 10/15/16 0224)  HYDROmorphone (DILAUDID) injection 1 mg (1 mg Intravenous Given 10/15/16 0224)  ondansetron (ZOFRAN) injection 4 mg (4 mg Intravenous Given 10/15/16 0336)  ceFEPIme (MAXIPIME) 2 g in dextrose 5 % 50 mL IVPB (0 g Intravenous Stopped 10/15/16 0501)  promethazine (PHENERGAN) injection 12.5 mg (12.5 mg Intravenous Given 10/15/16 0507)     Initial Impression / Assessment and Plan / ED Course  I have reviewed the triage vital signs and the nursing notes.  Pertinent labs & imaging results that were available during my care of the patient were reviewed by me and considered in my medical decision making (see chart for details).  Clinical Course   Perineal and rectal pain of uncertain cause. No evidence of abscess and no evidence of cellulitis. Old records are reviewed confirming that he did have an episode of acute renal failure with improving creatinine. Hospitalization last month for pulmonary edema. Most recent creatinine is 1.83. We'll send for CT of abdomen and pelvis to look for occult pathology. His sense  of urinary urgency is probably related to bladder spasms from his Foley catheter. He'll be given prochlorperazine for nausea and hydromorphone for pain.  Repeat temperature has come back elevated. WBC is markedly elevated at 18.5. Lactic acid is mildly elevated. Code sepsis is activated and is started on antibiotics for urinary tract infection. CT is still pending at this point. Renal insufficiency is present and unchanged from baseline. Alkaline phosphatase is noted to be rising - the cause for this is uncertain.  CT shows no acute process in the pelvic region. Right pleural effusion and right lower lobe pneumonia is noted. He does have significant constipation. Because of findings suggestive of pneumonia, vancomycin is added to his antibiotic regimen to cover for possible healthcare associated pneumonia. Case is discussed with Dr. Darrick Meigs of [facility] who agrees to admit the patient.  Final Clinical Impressions(s) / ED Diagnoses   Final diagnoses:  Urinary tract infection without hematuria, site unspecified  HCAP (healthcare-associated pneumonia)  Pleural effusion on right  Rectal pain  Renal insufficiency  Elevated lactic acid level  Normochromic normocytic anemia    New Prescriptions New Prescriptions   No medications on file     Delora Fuel, MD 35/46/56 [id number]

## 2016-10-15 NOTE — Progress Notes (Signed)
Patient admitted to the hospital by Dr. Darrick Meigs earlier today  Patient seen and examined. He is sleeping on my arrival, but wakes up to voice. Does not voice any complaints. Exam is unrevealing.  He has been admitted to the hospital with possible UTI and HCAP. He has been started on IV antibiotics. He has been bolused IV fluids per sepsis protocol. Since he has combined CHF with EF 30-35%, will discontinue maintenance IV fluids and bolus further fluids as needed. He has CKD 3 and creatinine is at baseline. Continue to follow. He is on chronic prednisone and plaquenil for rheumatoid arthritis. Will continue with current treatments and monitor closely  Rhenda [place]

## 2016-10-16 ENCOUNTER — Inpatient Hospital Stay (HOSPITAL_COMMUNITY): Payer: Medicare Other

## 2016-10-16 LAB — GLUCOSE, CAPILLARY
GLUCOSE-CAPILLARY: 84 mg/dL (ref 65–99)
GLUCOSE-CAPILLARY: 112 mg/dL — AB (ref 65–99)
Glucose-Capillary: 131 mg/dL — ABNORMAL HIGH (ref 65–99)

## 2016-10-16 LAB — HEMOGLOBIN A1C
Hgb A1c MFr Bld: 6.3 % — ABNORMAL HIGH (ref 4.8–5.6)
Mean Plasma Glucose: 134 mg/dL

## 2016-10-16 LAB — CBC
HCT: 22.1 % — ABNORMAL LOW (ref 39.0–52.0)
HEMOGLOBIN: 7.1 g/dL — AB (ref 13.0–17.0)
MCH: 31.4 pg (ref 26.0–34.0)
MCHC: 32.1 g/dL (ref 30.0–36.0)
MCV: 97.8 fL (ref 78.0–100.0)
Platelets: 138 10*3/uL — ABNORMAL LOW (ref 150–400)
RBC: 2.26 MIL/uL — AB (ref 4.22–5.81)
RDW: 16.2 % — ABNORMAL HIGH (ref 11.5–15.5)
WBC: 23.7 10*3/uL — AB (ref 4.0–10.5)

## 2016-10-16 LAB — BASIC METABOLIC PANEL
ANION GAP: 6 (ref 5–15)
BUN: 80 mg/dL — ABNORMAL HIGH (ref 6–20)
CALCIUM: 8.3 mg/dL — AB (ref 8.9–10.3)
CO2: 23 mmol/L (ref 22–32)
Chloride: 109 mmol/L (ref 101–111)
Creatinine, Ser: 1.88 mg/dL — ABNORMAL HIGH (ref 0.61–1.24)
GFR calc non Af Amer: 35 mL/min — ABNORMAL LOW (ref >60)
GFR, EST AFRICAN AMERICAN: 41 mL/min — AB (ref >60)
Glucose, Bld: 97 mg/dL (ref 65–99)
Potassium: 4.3 mmol/L (ref 3.5–5.1)
SODIUM: 138 mmol/L (ref 135–145)

## 2016-10-16 LAB — PREPARE RBC (CROSSMATCH)

## 2016-10-16 LAB — HIV ANTIBODY (ROUTINE TESTING W REFLEX): HIV Screen 4th Generation wRfx: NONREACTIVE

## 2016-10-16 LAB — FOLATE: Folate: 29.8 ng/mL (ref >5.9)

## 2016-10-16 LAB — RETICULOCYTES
RBC.: 2.3 MIL/uL — AB (ref 4.22–5.81)
RETIC COUNT ABSOLUTE: 39.1 10*3/uL (ref 19.0–186.0)
Retic Ct Pct: 1.7 % (ref 0.4–3.1)

## 2016-10-16 LAB — STREP PNEUMONIAE URINARY ANTIGEN: Strep Pneumo Urinary Antigen: NEGATIVE

## 2016-10-16 MED ORDER — ZOLPIDEM TARTRATE 5 MG PO TABS: 5.0000 mg | ORAL_TABLET | Freq: Every evening | ORAL | Status: DC | PRN

## 2016-10-16 MED ORDER — ZOLPIDEM TARTRATE 5 MG PO TABS
5.0000 mg | ORAL_TABLET | Freq: Every evening | ORAL | Status: DC | PRN
  Administered 2016-10-16 – 2016-10-20 (×2): 5 mg via ORAL
  Filled 2016-10-16 (×2): qty 1

## 2016-10-16 MED ORDER — COLLAGENASE 250 UNIT/GM EX OINT
TOPICAL_OINTMENT | Freq: Every day | CUTANEOUS | Status: DC
  Administered 2016-10-16 – 2016-10-20 (×5): via TOPICAL
  Administered 2016-10-21: 1 via TOPICAL
  Administered 2016-10-22: 12:00:00 via TOPICAL
  Filled 2016-10-16: qty 30

## 2016-10-16 MED ORDER — SODIUM CHLORIDE 0.9 % IV SOLN
Freq: Once | INTRAVENOUS | Status: AC
  Administered 2016-10-16: 15:00:00 via INTRAVENOUS

## 2016-10-16 MED ORDER — PIPERACILLIN-TAZOBACTAM 3.375 G IVPB
3.3750 g | Freq: Three times a day (TID) | INTRAVENOUS | Status: DC
  Administered 2016-10-16 – 2016-10-17 (×5): 3.375 g via INTRAVENOUS
  Filled 2016-10-16 (×6): qty 50

## 2016-10-16 NOTE — Progress Notes (Signed)
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 He had an indwelling catheter on arrival, which he stated made him feel a sense of frequent urination. He was found to have sepsis related to possible UTI and HCAP. He has been started on IV antibiotics.   Assessment & Plan:   Active Problems:   Insulin dependent diabetes mellitus ([place])   HCAP (healthcare-associated pneumonia)   Urinary tract infection without hematuria   Sepsis ([place])  1. Sepsis. Patient has been bolused IV fluids, per sepsis protocol. Blood pressures have improved. Blood cultures have shown no growth. Continue IV antibiotics. 2. UTI without hematuria. Patient was started on IV antibiotics. Urine culture has been ordered. Patient has indwelling foley catheter. 3. HCAP. CT abdomen had shown evidence of RLL pneumonia and effusion. Due to concerns for empyema, Dr. Luan Pulling has been consulted. He will undergo dedicated CT chest to further evaluate RLL. Currently on vancomycin. Will change cefepime to zosyn to provide anaerobic coverage. 4. Chronic combined CHF with EF 30-35%. Volume status appears to be stable. Demadex currently on hold due to sepsis. Continue to follow. 5. CKD stage 3. Creatinine is at baseline. Continue to monitor input and output. 6. Anemia. No reported evidence of bleeding. Possibly related to dilution as well as chronic disease. Check anemia panel and stool for occult blood. Since patient has a significant cardiac history and he symptomatic with generalized weakness and fatigue, will transfuse 2 unit prbc.  7. Rheumatoid arthritis. Steroid dependent. Continue current outpatient regimen of prednisone and plaquenil. Will monitor closely. 8. IDDM.  Blood sugars currently stable with SSI. Continue to monitor 9. HTN. Currently stable. Continue Toprol. 10. HLD. Continue statin. 11. BPH with indwelling foley catheter. Continue flomax 12. Constipation. Patient has had multiple bowel movements. Continue miralax 13. Left BKA wound. Wound care consulted   DVT prophylaxis: lovenox Code Status: Full Family Communication: discussed with wife at the bedside Disposition Plan: Discharge back to SNF once improved   Consultants:   None  Procedures:   None  Antimicrobials:   Vancomycin 10/21 >>  Zosyn 10/22>>   Subjective: Feels weak. Minimal cough, productive of clear sputum  Objective: Vitals:   10/15/16 1400 10/15/16 1851 10/15/16 2138 10/16/16 0607  BP: 109/61  (!) 109/44 105/71  Pulse: 99  68 80  Resp: 18     Temp: 98.7 F (37.1 C) 99.1 F (37.3 C) 99.9 F (37.7 C) 98.4 F (36.9 C)  TempSrc:  Axillary Axillary Oral  SpO2: 100%  100% 100%  Weight:        Intake/Output Summary (Last 24 hours) at 10/16/16 0708 Last data filed at 10/16/16 0609  Gross per 24 hour  Intake            502.5 ml  Output              655 ml  Net           -152.5 ml   Filed Weights   10/15/16 0700  Weight: 82 kg (180 lb 12.4 oz)    Examination:  General exam: Appears calm and comfortable  Respiratory system: diminished breath sounds at bases,  Respiratory effort normal. Cardiovascular system: S1 & S2 heard, RRR. No JVD, murmurs,  rubs, gallops or clicks. No pedal edema. Gastrointestinal system: Abdomen is nondistended, soft and nontender. No organomegaly or masses felt. Normal bowel sounds heard. Central nervous system: Alert and oriented. No focal neurological deficits. Extremities: Left BKA Skin: No rashes, lesions or ulcers Psychiatry: Judgement and insight appear normal. Mood & affect appropriate.     Data Reviewed: I have personally reviewed following labs and imaging studies  CBC:  Recent Labs Lab 10/11/16 0500  10/15/16 0219 10/16/16 0524  WBC 8.4 18.5* 23.7*  NEUTROABS 4.3 13.1*  --   HGB 7.8* 9.1* 7.1*  HCT 24.4* 28.9* 22.1*  MCV 95.3 96.0 97.8  PLT 151 199 174*   Basic Metabolic Panel:  Recent Labs Lab 10/11/16 0500 10/15/16 0219 10/16/16 0524  NA 138 137 138  K 4.0 4.6 4.3  CL 102 101 109  CO2 27 27 23   GLUCOSE 114* 182* 97  BUN 97* 83* 80*  CREATININE 1.83* 2.08* 1.88*  CALCIUM 8.6* 8.9 8.3*   GFR: Estimated Creatinine Clearance: 38.1 mL/min (by C-G formula based on SCr of 1.88 mg/dL (H)). Liver Function Tests:  Recent Labs Lab 10/11/16 0500 10/15/16 0219  AST 21 38  ALT 23 40  ALKPHOS 295* 390*  BILITOT 0.5 0.7  PROT 5.9* 6.8  ALBUMIN 2.4* 2.7*   No results for input(s): LIPASE, AMYLASE in the last 168 hours. No results for input(s): AMMONIA in the last 168 hours. Coagulation Profile: No results for input(s): INR, PROTIME in the last 168 hours. Cardiac Enzymes: No results for input(s): CKTOTAL, CKMB, CKMBINDEX, TROPONINI in the last 168 hours. BNP (last 3 results) No results for input(s): PROBNP in the last 8760 hours. HbA1C: No results for input(s): HGBA1C in the last 72 hours. CBG:  Recent Labs Lab 10/15/16 1206 10/15/16 1845 10/15/16 2142  GLUCAP 175* 216* 138*   Lipid Profile: No results for input(s): CHOL, HDL, LDLCALC, TRIG, CHOLHDL, LDLDIRECT in the last 72 hours. Thyroid Function Tests: No results for input(s): TSH, T4TOTAL, FREET4, T3FREE, THYROIDAB in the last 72 hours. Anemia Panel: No results for input(s): VITAMINB12, FOLATE, FERRITIN, TIBC, IRON, RETICCTPCT in the last 72 hours. Sepsis Labs:  Recent Labs Lab 10/15/16 0234 10/15/16 0605  LATICACIDVEN 2.32* 1.19    Recent Results (from the past 240 hour(s))  Blood Culture (routine x 2)     Status: None (Preliminary result)   Collection Time: 10/15/16  4:01 AM  Result Value Ref Range Status   Specimen Description BLOOD RIGHT HAND  Final   Special Requests BOTTLES DRAWN AEROBIC  AND ANAEROBIC 6CC EACH  Final   Culture NO GROWTH < 12 HOURS  Final   Report Status PENDING  Incomplete  Blood Culture (routine x 2)     Status: None (Preliminary result)   Collection Time: 10/15/16  4:18 AM  Result Value Ref Range Status   Specimen Description LEFT ANTECUBITAL  Final   Special Requests BOTTLES DRAWN AEROBIC AND ANAEROBIC 8CC EACH  Final   Culture NO GROWTH < 12 HOURS  Final   Report Status PENDING  Incomplete         Radiology Studies: Ct Abdomen Pelvis Wo Contrast  Result Date: 10/15/2016 CLINICAL DATA:  Rectal pain tonight. History of diabetes, acute renal failure, loculated pleural effusion. EXAM: CT ABDOMEN AND PELVIS WITHOUT CONTRAST TECHNIQUE: Multidetector CT imaging of the abdomen and pelvis was performed following the standard protocol without IV contrast. Oral contrast administered. COMPARISON:  CT chest September 08, 2016 FINDINGS: LOWER CHEST: Small residual RIGHT pleural  effusion with thickened pleura and RIGHT lower lobe consolidation. Tree-in-bud infiltrates through the included bilateral lungs. Resolution of LEFT pleural effusion with residual LEFT lung base atelectasis/scarring. Heart size is normal. Severe coronary artery calcifications. Status post median sternotomy. HEPATOBILIARY: Multiple tiny layering gallstones without CT findings of acute cholecystitis. Noncontrast liver is normal. PANCREAS: Predominate fatty replaced pancreas. SPLEEN: Normal. ADRENALS/URINARY TRACT: Kidneys are orthotopic, demonstrating normal size and morphology. Severe vascular calcifications. No nephrolithiasis, hydronephrosis; limited assessment for renal masses on this nonenhanced examination. The unopacified ureters are normal in course and caliber. Urinary bladder decompressed containing a Foley catheter and retaining bulb. Normal adrenal glands. STOMACH/BOWEL: The stomach, small and large bowel are normal in course and caliber without inflammatory changes. Enteric contrast has  not yet reached the distal small bowel. Moderate to large amount of retained large bowel stool. Mild circumferential rectal wall thickening without fat stranding suggesting internal hemorrhoids. Small bowel feces compatible chronic stasis. Normal appendix. VASCULAR/LYMPHATIC: Aortoiliac vessels are normal in course and caliber, severe atherosclerosis. No lymphadenopathy by CT size criteria. REPRODUCTIVE: Normal. OTHER: No intraperitoneal free fluid or free air. MUSCULOSKELETAL: Non-acute. Moderate fat containing umbilical hernia. Moderate LEFT fat containing inguinal hernia. Status post LEFT hip total arthroplasty resulting in streak artifact. Moderate degenerative change of the RIGHT hip. Severe L3-4 disc height loss, endplate irregularity compatible with degenerative disc resulting in severe in L3-4 neural foraminal narrowing. Moderate L2-3 degenerative disc. IMPRESSION: Moderate to large amount of retained large bowel stool without bowel obstruction or acute intra-abdominal/ pelvic process. Suspected internal hemorrhoids. Small residual RIGHT pleural effusion with pleural thickening, empyema not excluded. RIGHT lower lobe focal pneumonia. Diffuse tree-in-bud infiltrates may be infectious or inflammatory. Severe atherosclerosis. Electronically Signed   By: Elon Alas M.D.   On: 10/15/2016 05:02        Scheduled Meds: . aspirin  81 mg Oral q morning - 10a  . atorvastatin  80 mg Oral Daily  . ceFEPime (MAXIPIME) IV  2 g Intravenous Q24H  . docusate sodium  100 mg Oral BID  . enoxaparin (LOVENOX) injection  40 mg Subcutaneous Q24H  . feeding supplement (PRO-STAT SUGAR FREE 64)  30 mL Oral BID  . hydroxychloroquine  200 mg Oral Daily  . insulin aspart  0-9 Units Subcutaneous TID WC  . metoprolol succinate  37.5 mg Oral q morning - 10a  . olopatadine  1 drop Both Eyes BID  . omega-3 acid ethyl esters  2 g Oral BID  . polyethylene glycol  17 g Oral BID  . predniSONE  7.5 mg Oral Q breakfast    . pregabalin  25 mg Oral Daily  . sodium chloride  500 mL Intravenous Once  . tamsulosin  0.4 mg Oral QHS  . thiamine  100 mg Oral Daily  . vancomycin  1,250 mg Intravenous Q24H  . vitamin C  500 mg Oral Daily  . zinc sulfate  220 mg Oral Daily   Continuous Infusions: . sodium chloride 0.9 % 1,000 mL infusion 10 mL/hr at 10/15/16 0745     LOS: 1 day    Time spent: 25 minutes    Kathie Dike, MD [facility]  If 7PM-7AM, please contact night-coverage [email or web address] Password [email or web address] 10/16/2016, 7:08 AM

## 2016-10-16 NOTE — Consult Note (Signed)
Consult requested by:Dr. 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 He has been in a nursing home following the amputation of his leg with an indwelling Foley catheter. He developed pelvic pain and came to the emergency department where he was found to have a urinary tract infection. He is febrile. As part of his workup he had a CT of the abdomen and pelvis which showed right lower lobe consolidation and a small right pleural effusion with thickened pleura. He has multiple other findings on his CT including tree-in-bud infiltrates severe coronary artery calcifications and gallstones. He says he feels better. He is still very weak. He is not coughing very much but he is coughing up a little bit of sputum. No chest pain. No abdominal pain now. He has been essentially nonambulatory because of his rheumatoid arthritis and his recent amputation. He is able to roll over in the bed now but is very weak still  Past Medical History:  Diagnosis Date  . Acute on chronic combined systolic and diastolic CHF (congestive heart failure) ([place]) 09/02/2016  . Chronic back pain   . Chronic neck pain   . Coronary artery disease 1993   angioplasty after AMI  . Diabetes mellitus   . Diabetic neuropathy ([place])   . Hyperkalemia   . Hypertension   . Loculated pleural effusion 09/09/2016  . Pancytopenia ([place]) 08/13/2014  . 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  . Rheumatoid arthritis(714.0)   . Seizure ([place]) 08/13/2014   Pt denies     Family History  Problem Relation Age of Onset  . Diabetes Father   . Dementia Father   . Heart attack Brother     multiple brothers  with MIs  . Hypertension Mother   . Stroke Mother      Social History   Social History  . Marital status: Married    Spouse name: N/A  . Number of children: N/A  . Years of education: N/A   Social History Main Topics  . Smoking status: Never Smoker  . Smokeless tobacco: Never Used  . Alcohol use No  . Drug use: No  . Sexual activity: Not Asked   Other Topics Concern  . None   Social History Narrative  . None     ROS: No visual problems. He says he's weak. He had some nausea earlier but that's better. His abdominal pain has resolved. He is somewhat short of breath but is not very active so it's hard to tell how severe that is. He has coughed some and is coughing up some clear sputum. He had fever and chills. He has a chronic indwelling Foley catheter and was complaining of pelvic pain. He has pain diffusely in his joints from his rheumatoid arthritis. Otherwise review of systems is negative.    Objective: Vital signs in last 24 hours: Temp:  [98.4 F (36.9 C)-101.1 F (38.4 C)] 98.4 F (36.9 C) (10/22 0607) Pulse Rate:  [68-99] 80 (10/22 0607) Resp:  [18] 18 (10/21 1400) BP: (105-109)/(44-71) 105/71 (10/22 0607) SpO2:  [100 %] 100 % (10/22 0607) Weight change:  Last BM Date: 10/15/16  Intake/Output from previous day: 10/21 0701 - 10/22 0700 In: 502.5 [I.V.:202.5; IV Piggyback:300] Out: 655 [Urine:650;  Stool:5]  PHYSICAL EXAM He is a thin pale male in no acute distress. He has significant changes of rheumatoid arthritis in his hands and his remaining foot. His pupils are reactive. Conjunctiva are pale. Nose and throat are clear. His mucous membranes are moist. His neck is supple without masses. His lungs are relatively clear. His heart is regular without gallop. His abdomen is soft no masses are felt no tenderness bowel sounds are present and active. He has had amputation of his left leg. His right leg shows no edema and his pulses are diminished. Central nervous  system examination is grossly intact he has an indwelling Foley catheter  Lab Results: Basic Metabolic Panel:  Recent Labs  10/15/16 0219 10/16/16 0524  NA 137 138  K 4.6 4.3  CL 101 109  CO2 27 23  GLUCOSE 182* 97  BUN 83* 80*  CREATININE 2.08* 1.88*  CALCIUM 8.9 8.3*   Liver Function Tests:  Recent Labs  10/15/16 0219  AST 38  ALT 40  ALKPHOS 390*  BILITOT 0.7  PROT 6.8  ALBUMIN 2.7*   No results for input(s): LIPASE, AMYLASE in the last 72 hours. No results for input(s): AMMONIA in the last 72 hours. CBC:  Recent Labs  10/15/16 0219 10/16/16 0524  WBC 18.5* 23.7*  NEUTROABS 13.1*  --   HGB 9.1* 7.1*  HCT 28.9* 22.1*  MCV 96.0 97.8  PLT 199 138*   Cardiac Enzymes: No results for input(s): CKTOTAL, CKMB, CKMBINDEX, TROPONINI in the last 72 hours. BNP: No results for input(s): PROBNP in the last 72 hours. D-Dimer: No results for input(s): DDIMER in the last 72 hours. CBG:  Recent Labs  10/15/16 1206 10/15/16 1845 10/15/16 2142 10/16/16 0803  GLUCAP 175* 216* 138* 84   Hemoglobin A1C: No results for input(s): HGBA1C in the last 72 hours. Fasting Lipid Panel: No results for input(s): CHOL, HDL, LDLCALC, TRIG, CHOLHDL, LDLDIRECT in the last 72 hours. Thyroid Function Tests: No results for input(s): TSH, T4TOTAL, FREET4, T3FREE, THYROIDAB in the last 72 hours. Anemia Panel: No results for input(s): VITAMINB12, FOLATE, FERRITIN, TIBC, IRON, RETICCTPCT in the last 72 hours. Coagulation: No results for input(s): LABPROT, INR in the last 72 hours. Urine Drug Screen: Drugs of Abuse     Component Value Date/Time   LABOPIA NONE DETECTED 08/12/2014 2358   COCAINSCRNUR NONE DETECTED 08/12/2014 2358   LABBENZ NONE DETECTED 08/12/2014 2358   AMPHETMU NONE DETECTED 08/12/2014 2358   THCU NONE DETECTED 08/12/2014 2358   LABBARB NONE DETECTED 08/12/2014 2358    Alcohol Level: No results for input(s): ETH in the last 72 hours. Urinalysis:  Recent  Labs  10/15/16 0152  COLORURINE YELLOW  LABSPEC 1.015  PHURINE 6.0  GLUCOSEU NEGATIVE  HGBUR LARGE*  BILIRUBINUR NEGATIVE  KETONESUR NEGATIVE  PROTEINUR 100*  NITRITE POSITIVE*  LEUKOCYTESUR LARGE*   Misc. Labs:   ABGS: No results for input(s): PHART, PO2ART, TCO2, HCO3 in the last 72 hours.  Invalid input(s): PCO2   MICROBIOLOGY: Recent Results (from the past 240 hour(s))  Blood Culture (routine x 2)     Status: None (Preliminary result)   Collection Time: 10/15/16  4:01 AM  Result Value Ref Range Status   Specimen Description BLOOD RIGHT HAND  Final   Special Requests BOTTLES DRAWN AEROBIC AND ANAEROBIC 6CC EACH  Final   Culture NO GROWTH 1 DAY  Final   Report Status PENDING  Incomplete  Blood Culture (routine x 2)     Status: None (  Preliminary result)   Collection Time: 10/15/16  4:18 AM  Result Value Ref Range Status   Specimen Description LEFT ANTECUBITAL  Final   Special Requests BOTTLES DRAWN AEROBIC AND ANAEROBIC 8CC EACH  Final   Culture NO GROWTH 1 DAY  Final   Report Status PENDING  Incomplete    Studies/Results: Ct Abdomen Pelvis Wo Contrast  Result Date: 10/15/2016 CLINICAL DATA:  Rectal pain tonight. History of diabetes, acute renal failure, loculated pleural effusion. EXAM: CT ABDOMEN AND PELVIS WITHOUT CONTRAST TECHNIQUE: Multidetector CT imaging of the abdomen and pelvis was performed following the standard protocol without IV contrast. Oral contrast administered. COMPARISON:  CT chest September 08, 2016 FINDINGS: LOWER CHEST: Small residual RIGHT pleural effusion with thickened pleura and RIGHT lower lobe consolidation. Tree-in-bud infiltrates through the included bilateral lungs. Resolution of LEFT pleural effusion with residual LEFT lung base atelectasis/scarring. Heart size is normal. Severe coronary artery calcifications. Status post median sternotomy. HEPATOBILIARY: Multiple tiny layering gallstones without CT findings of acute cholecystitis.  Noncontrast liver is normal. PANCREAS: Predominate fatty replaced pancreas. SPLEEN: Normal. ADRENALS/URINARY TRACT: Kidneys are orthotopic, demonstrating normal size and morphology. Severe vascular calcifications. No nephrolithiasis, hydronephrosis; limited assessment for renal masses on this nonenhanced examination. The unopacified ureters are normal in course and caliber. Urinary bladder decompressed containing a Foley catheter and retaining bulb. Normal adrenal glands. STOMACH/BOWEL: The stomach, small and large bowel are normal in course and caliber without inflammatory changes. Enteric contrast has not yet reached the distal small bowel. Moderate to large amount of retained large bowel stool. Mild circumferential rectal wall thickening without fat stranding suggesting internal hemorrhoids. Small bowel feces compatible chronic stasis. Normal appendix. VASCULAR/LYMPHATIC: Aortoiliac vessels are normal in course and caliber, severe atherosclerosis. No lymphadenopathy by CT size criteria. REPRODUCTIVE: Normal. OTHER: No intraperitoneal free fluid or free air. MUSCULOSKELETAL: Non-acute. Moderate fat containing umbilical hernia. Moderate LEFT fat containing inguinal hernia. Status post LEFT hip total arthroplasty resulting in streak artifact. Moderate degenerative change of the RIGHT hip. Severe L3-4 disc height loss, endplate irregularity compatible with degenerative disc resulting in severe in L3-4 neural foraminal narrowing. Moderate L2-3 degenerative disc. IMPRESSION: Moderate to large amount of retained large bowel stool without bowel obstruction or acute intra-abdominal/ pelvic process. Suspected internal hemorrhoids. Small residual RIGHT pleural effusion with pleural thickening, empyema not excluded. RIGHT lower lobe focal pneumonia. Diffuse tree-in-bud infiltrates may be infectious or inflammatory. Severe atherosclerosis. Electronically Signed   By: Elon Alas M.D.   On: 10/15/2016 05:02     Medications:  Prior to Admission:  Prescriptions Prior to Admission  Medication Sig Dispense Refill Last Dose  . acetaminophen (TYLENOL) 325 MG tablet Take 650 mg by mouth every 6 (six) hours as needed for mild pain or moderate pain.    unknown  . ALPRAZolam (XANAX) 0.25 MG tablet Take 1 tablet (0.25 mg total) by mouth 3 (three) times daily as needed for anxiety. 30 tablet 0 10/14/2016 at Unknown time  . Amino Acids-Protein Hydrolys (PRO-STAT 64 PO) Take 30 mLs by mouth 2 (two) times daily.   10/14/2016 at Unknown time  . Artificial Tear Ointment (REFRESH P.M. OP) Apply 1 drop to eye daily as needed (FOR DY EYES). ONE APPLICATION BOTH EYES. For dry eyes not relieved by theratears every 6 hours    unknown  . aspirin 81 MG chewable tablet Chew 81 mg by mouth every morning.    10/14/2016 at Unknown time  . atorvastatin (LIPITOR) 80 MG tablet Take 80 mg by mouth daily.  10/14/2016 at Unknown time  . Balsam [place]-Castor Oil (VENELEX) OINT Apply 1 application topically 3 (three) times daily. Apply to sacrum every shift day, evening, Night and as needed.   10/14/2016 at Unknown time  . Carboxymethylcellulose Sodium (THERATEARS) 0.25 % SOLN Apply 2 drops to both eyes three times a day   10/14/2016 at Unknown time  . collagenase (SANTYL) ointment Apply 1 application topically daily. Apply to residual surgical site per tx orders   10/14/2016 at Unknown time  . docusate sodium (COLACE) 100 MG capsule Take 100 mg by mouth 2 (two) times daily.   10/14/2016 at Unknown time  . hydroxychloroquine (PLAQUENIL) 200 MG tablet Take 200 mg by mouth daily.    10/14/2016 at Unknown time  . insulin NPH Human (HUMULIN N) 100 UNIT/ML injection Give 5 units subcutaneous twice a day   10/14/2016 at Unknown time  . iron polysaccharides (NIFEREX) 150 MG capsule Take 150 mg by mouth daily.   10/14/2016 at Unknown time  . levalbuterol (XOPENEX) 0.63 MG/3ML nebulizer solution Take 3 mLs (0.63 mg total) by nebulization every 4  (four) hours as needed for wheezing or shortness of breath.   unknown  . Melatonin 3 MG TABS Take 2 tablets by mouth at bedtime   10/14/2016 at Unknown time  . metoprolol succinate (TOPROL-XL) 25 MG 24 hr tablet Take 1.5 tablets (37.5 mg total) by mouth every morning.   10/14/2016 at 0900  . Multiple Vitamins-Minerals (THERA-M ENHANCED PO) 1 tablet by mouth daily   10/14/2016 at Unknown time  . nystatin cream (MYCOSTATIN) Apply 1 application topically daily. Apply to bottom rash QD    10/14/2016 at Unknown time  . olopatadine (PATANOL) 0.1 % ophthalmic solution Place 1 drop into both eyes 2 (two) times daily.   10/14/2016 at Unknown time  . omega-3 acid ethyl esters (LOVAZA) 1 g capsule Take 2 g by mouth 2 (two) times daily.   10/14/2016 at Unknown time  . oxyCODONE (OXY IR/ROXICODONE) 5 MG immediate release tablet Take one tablet by mouth every 6 hours as needed for pain 120 tablet 0 10/14/2016 at Unknown time  . polyethylene glycol powder (MIRALAX) powder Take 17 g by mouth 2 (two) times daily.   10/14/2016 at Unknown time  . predniSONE (DELTASONE) 2.5 MG tablet Take 3 tablets (7.5 mg total) by mouth daily with breakfast. 30 tablet 1 10/14/2016 at Unknown time  . pregabalin (LYRICA) 25 MG capsule Take one capsule by mouth at bedtime 30 capsule 0 10/14/2016 at Unknown time  . Tamsulosin HCl (FLOMAX) 0.4 MG CAPS Take 0.4 mg by mouth at bedtime.    10/14/2016 at Unknown time  . thiamine (VITAMIN B-1) 100 MG tablet Take 100 mg by mouth daily.   10/14/2016 at Unknown time  . torsemide (DEMADEX) 10 MG tablet Give 20 mg by mouth once a day every other day   unknown  . traZODone (DESYREL) 50 MG tablet Take 50 mg by mouth at bedtime.   10/14/2016 at Unknown time  . vitamin C (ASCORBIC ACID) 500 MG tablet Take 500 mg by mouth daily.   10/14/2016 at Unknown time  . zinc sulfate 220 (50 Zn) MG capsule Take 220 mg by mouth daily.   10/14/2016 at Unknown time  . OXYGEN Inhale 2 L into the lungs continuous. O 2  at 2 LPM via n/c to keep O2 sat above 90 % every shift    Taking   Scheduled: . sodium chloride   Intravenous Once  .  aspirin  81 mg Oral q morning - 10a  . atorvastatin  80 mg Oral Daily  . ceFEPime (MAXIPIME) IV  2 g Intravenous Q24H  . docusate sodium  100 mg Oral BID  . enoxaparin (LOVENOX) injection  40 mg Subcutaneous Q24H  . feeding supplement (PRO-STAT SUGAR FREE 64)  30 mL Oral BID  . hydroxychloroquine  200 mg Oral Daily  . insulin aspart  0-9 Units Subcutaneous TID WC  . metoprolol succinate  37.5 mg Oral q morning - 10a  . olopatadine  1 drop Both Eyes BID  . omega-3 acid ethyl esters  2 g Oral BID  . polyethylene glycol  17 g Oral BID  . predniSONE  7.5 mg Oral Q breakfast  . pregabalin  25 mg Oral Daily  . sodium chloride  500 mL Intravenous Once  . tamsulosin  0.4 mg Oral QHS  . thiamine  100 mg Oral Daily  . vancomycin  1,250 mg Intravenous Q24H  . vitamin C  500 mg Oral Daily  . zinc sulfate  220 mg Oral Daily   Continuous: . sodium chloride 0.9 % 1,000 mL infusion 10 mL/hr at 10/15/16 0745   ZOX:[id number], ALPRAZolam, HYDROcodone-acetaminophen  Assesment:CT shows pneumonia. He is on appropriate treatment for that with the treatment for his UTI. He had pneumonia previously and this may be residual. He has what appears to be a small right pleural effusion with some pleural thickening and there is concern about empyema. Tree-in-bud infiltrates may be related to something like Mycobacterium avium complex disease.  He has urinary tract infection probably related to his chronic indwelling Foley catheter.  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 Active Problems:   Insulin dependent diabetes mellitus ([place])   HCAP (healthcare-associated pneumonia)   Urinary tract infection without  hematuria   Sepsis ([place])    Plan: I would proceed with CT chest without contrast. No other new treatments for now. He may need thoracentesis but he may not have enough fluid to get thoracentesis safely. Continue IV antibiotics    LOS: 1 day   Brianda Beitler L 10/16/2016, 9:59 AM

## 2016-10-16 NOTE — Progress Notes (Signed)
Pharmacy Antibiotic Note  [name] is a 65 y.o. male admitted on 10/15/2016 with sepsis/ UTI/Pneumonia.   Pharmacy has been consulted for zosyn and vancomycin dosing. Antibiotic change to zosyn for aspiration PNA along with vancomycin  Plan: Zosyn 3.375gm IV q8h EID over 4 hours Continue Vancomycin 1250mg  IV q24h. Goal trough 15-51mcg/ml F/U cultures, V/S and monitor clinical progress Vancomycin trough levels as indicated  Weight: 180 lb 12.4 oz (82 kg)  Temp (24hrs), Avg:99.4 F (37.4 C), Min:98.4 F (36.9 C), Max:101.1 F (38.4 C)   Recent Labs Lab 10/11/16 0500 10/15/16 0219 10/15/16 0234 10/15/16 0605 10/16/16 0524  WBC 8.4 18.5*  --   --  23.7*  CREATININE 1.83* 2.08*  --   --  1.88*  LATICACIDVEN  --   --  2.32* 1.19  --     Estimated Creatinine Clearance: 38.1 mL/min (by C-G formula based on SCr of 1.88 mg/dL (H)).    Allergies  Allergen Reactions  . Sulfa Antibiotics Rash    Antimicrobials this admission: Vancomycin 10/21 >>  Cefepime 10/21 >> 10/22 Zosyn 10/22>>  Dose adjustments this admission: n/a  Microbiology results: 10/21 BCx: ngtd 10/21 UCx: in process 10/21 Sputum: pending  08/26/16  MRSA PCR: negative  Thank you for allowing pharmacy to be a part of this patient's care.  Isac Sarna, BS Pharm D, [place] Clinical Pharmacist Pager [phone] 10/16/2016 10:59 AM

## 2016-10-16 NOTE — Consult Note (Signed)
[facility] Nurse wound consult note Reason for Consult:Chronic, noon-healing wounds on right stump.  MASD to sacrum, left heel at high risk for skin breakdown Wound type: dehisced surgical, moisture Pressure Ulcer POA: No Measurement: Proximal black eschar measures 6cm x 2cm and is firmly adherent with surrounding erythema measuring 0.4cm.  Wound at medial edge of 15cm incision measures 1cm x 2cm x 0.4cm and lateral edge measures 2.8cm x 4.5cm x 0.4cm.  Both wounds have a pink base with yellow eschar and dried serum at the periphery. Coccyx with blanching erythema secondary to moisture. The site of a muscle biopsy at the left lateral thigh is healing nicely and measures 3cm in length x 0.1cm with nearly imperceivable depth. A silicone dressing covers this. Wound bed: proximal eschar is firmly adherent with periwound erythema to 0.4cm.   Drainage (amount, consistency, odor) serous Periwound:As described above. Dressing procedure/placement/frequency: Patient is followed by a wound care physician at the Rehab facility and has been seen at the outpatient wound care center. I will continue the orders of those physicians and implement enzymatic debridement using collagenase (Santyl). A pressure redistribution heel boot is provided for the right heel and a prophylactic dressing is ordered for the sacrum/coccyx.  Patient and wife understand the benefits of turning and repositioning to relieve pressure to the coccyx. Patient uses a pressure redistribution chair pad when OOB at the Rehab facility. [facility] nursing team will not follow, but will remain available to this patient, the nursing and medical teams.  Please re-consult if needed. Thanks, Maudie Flakes, MSN, RN, [place], [facility]  Pager# [phone]

## 2016-10-17 ENCOUNTER — Other Ambulatory Visit: Payer: Self-pay | Admitting: *Deleted

## 2016-10-17 LAB — BASIC METABOLIC PANEL
Anion gap: 8 (ref 5–15)
BUN: 80 mg/dL — AB (ref 6–20)
CHLORIDE: 107 mmol/L (ref 101–111)
CO2: 20 mmol/L — AB (ref 22–32)
CREATININE: 1.72 mg/dL — AB (ref 0.61–1.24)
Calcium: 8.4 mg/dL — ABNORMAL LOW (ref 8.9–10.3)
GFR calc Af Amer: 46 mL/min — ABNORMAL LOW (ref >60)
GFR calc non Af Amer: 39 mL/min — ABNORMAL LOW (ref >60)
Glucose, Bld: 145 mg/dL — ABNORMAL HIGH (ref 65–99)
POTASSIUM: 4.1 mmol/L (ref 3.5–5.1)
Sodium: 135 mmol/L (ref 135–145)

## 2016-10-17 LAB — CBC
HEMATOCRIT: 30.1 % — AB (ref 39.0–52.0)
HEMOGLOBIN: 9.5 g/dL — AB (ref 13.0–17.0)
MCH: 29.3 pg (ref 26.0–34.0)
MCHC: 31.6 g/dL (ref 30.0–36.0)
MCV: 92.9 fL (ref 78.0–100.0)
Platelets: 142 10*3/uL — ABNORMAL LOW (ref 150–400)
RBC: 3.24 MIL/uL — ABNORMAL LOW (ref 4.22–5.81)
RDW: 17.8 % — ABNORMAL HIGH (ref 11.5–15.5)
WBC: 20.3 10*3/uL — ABNORMAL HIGH (ref 4.0–10.5)

## 2016-10-17 LAB — IRON AND TIBC
IRON: 12 ug/dL — AB (ref 45–182)
SATURATION RATIOS: 11 % — AB (ref 17.9–39.5)
TIBC: 108 ug/dL — AB (ref 250–450)
UIBC: 96 ug/dL

## 2016-10-17 LAB — VANCOMYCIN, TROUGH: VANCOMYCIN TR: 30 ug/mL — AB (ref 15–20)

## 2016-10-17 LAB — GLUCOSE, CAPILLARY
GLUCOSE-CAPILLARY: 166 mg/dL — AB (ref 65–99)
GLUCOSE-CAPILLARY: 210 mg/dL — AB (ref 65–99)
Glucose-Capillary: 128 mg/dL — ABNORMAL HIGH (ref 65–99)
Glucose-Capillary: 207 mg/dL — ABNORMAL HIGH (ref 65–99)

## 2016-10-17 LAB — OCCULT BLOOD X 1 CARD TO LAB, STOOL: Fecal Occult Bld: NEGATIVE

## 2016-10-17 LAB — VITAMIN B12: VITAMIN B 12: 474 pg/mL (ref 180–914)

## 2016-10-17 LAB — FERRITIN: Ferritin: 559 ng/mL — ABNORMAL HIGH (ref 24–336)

## 2016-10-17 LAB — MRSA PCR SCREENING: MRSA by PCR: POSITIVE — AB

## 2016-10-17 MED ORDER — CHLORHEXIDINE GLUCONATE CLOTH 2 % EX PADS
6.0000 | MEDICATED_PAD | Freq: Every day | CUTANEOUS | Status: AC
  Administered 2016-10-17 – 2016-10-21 (×5): 6 via TOPICAL

## 2016-10-17 MED ORDER — OXYCODONE HCL 5 MG PO TABS: ORAL_TABLET | ORAL | 0 refills | Status: DC

## 2016-10-17 MED ORDER — MUPIROCIN 2 % EX OINT
1.0000 "application " | TOPICAL_OINTMENT | Freq: Two times a day (BID) | CUTANEOUS | Status: AC
  Administered 2016-10-17 – 2016-10-21 (×9): 1 via NASAL
  Filled 2016-10-17 (×2): qty 22

## 2016-10-17 NOTE — Care Management Important Message (Signed)
Important Message  Patient Details  Name: [name] MRN: [id number] Date of Birth: 23-Jan-1951   Medicare Important Message Given:  Yes    Yazid Pop, Chauncey [place], RN 10/17/2016, 12:00 PM

## 2016-10-17 NOTE — Progress Notes (Signed)
Subjective: He says he feels better. He received blood and he's not as weak. He had CT of the chest done yesterday. He denies any chest pain nausea vomiting but still has some pelvic discomfort. He is less short of breath.  Objective: Vital signs in last 24 hours: Temp:  [87 F (30.6 C)-99.8 F (37.7 C)] 87 F (30.6 C) (10/23 0701) Pulse Rate:  [67-119] 119 (10/23 0537) Resp:  [18-20] 19 (10/23 0537) BP: (107-152)/(44-68) 107/68 (10/23 0537) SpO2:  [100 %] 100 % (10/23 0537) Weight change:  Last BM Date: 10/16/16  Intake/Output from previous day: 10/22 0701 - 10/23 0700 In: 1664 [P.O.:480; I.V.:80; Blood:804; IV Piggyback:300] Out: 601 [Urine:600; Stool:1]  PHYSICAL EXAM General appearance: alert, cooperative and mild distress Resp: Rhonchi on the right more than the left Cardio: regular rate and rhythm, S1, S2 normal, no murmur, click, rub or gallop GI: soft, non-tender; bowel sounds normal; no masses,  no organomegaly Extremities: 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 He has severe changes of rheumatoid arthritis in the joints of his hands and his remaining foot. His color is much better. Mucous membranes are moist.  Lab Results:  Results for orders placed or performed during the hospital encounter of 10/15/16 (from the past 48 hour(s))  Glucose, capillary     Status: Abnormal   Collection Time: 10/15/16 12:06 PM  Result Value Ref Range   Glucose-Capillary 175 (H) 65 - 99 mg/dL   Comment 1 Notify RN   Glucose, capillary     Status: Abnormal   Collection Time: 10/15/16  6:45 PM  Result Value Ref Range   Glucose-Capillary 216 (H) 65 - 99 mg/dL   Comment 1 Document in Chart   Glucose, capillary     Status: Abnormal   Collection Time: 10/15/16  9:42 PM  Result Value Ref Range   Glucose-Capillary 138 (H) 65 - 99 mg/dL   Comment 1 Notify RN    Comment 2 Document in Chart   Basic metabolic panel     Status: Abnormal   Collection Time: 10/16/16  5:24 AM   Result Value Ref Range   Sodium 138 135 - 145 mmol/L   Potassium 4.3 3.5 - 5.1 mmol/L   Chloride 109 101 - 111 mmol/L   CO2 23 22 - 32 mmol/L   Glucose, Bld 97 65 - 99 mg/dL   BUN 80 (H) 6 - 20 mg/dL   Creatinine, Ser 1.88 (H) 0.61 - 1.24 mg/dL   Calcium 8.3 (L) 8.9 - 10.3 mg/dL   GFR calc non Af Amer 35 (L) >60 mL/min   GFR calc Af Amer 41 (L) >60 mL/min    Comment: (NOTE) The eGFR has been calculated using the CKD EPI equation. This calculation has not been validated in all clinical situations. eGFR's persistently <60 mL/min signify possible Chronic Kidney Disease.    Anion gap 6 5 - 15  CBC     Status: Abnormal   Collection Time: 10/16/16  5:24 AM  Result Value Ref Range   WBC 23.7 (H) 4.0 - 10.5 K/uL   RBC 2.26 (L) 4.22 - 5.81 MIL/uL   Hemoglobin 7.1 (L) 13.0 - 17.0 g/dL    Comment: DELTA CHECK NOTED RESULT REPEATED AND VERIFIED    HCT 22.1 (L) 39.0 - 52.0 %   MCV 97.8 78.0 - 100.0 fL   MCH 31.4 26.0 - 34.0 pg   MCHC 32.1 30.0 - 36.0 g/dL   RDW 16.2 (H) 11.5 -  15.5 %   Platelets 138 (L) 150 - 400 K/uL  Glucose, capillary     Status: None   Collection Time: 10/16/16  8:03 AM  Result Value Ref Range   Glucose-Capillary 84 65 - 99 mg/dL   Comment 1 Notify RN    Comment 2 Document in Chart   Type and screen [facility]     Status: None (Preliminary result)   Collection Time: 10/16/16 10:36 AM  Result Value Ref Range   ABO/RH(D) O POS    Antibody Screen NEG    Sample Expiration 10/19/2016    Unit Number [id number]    Blood Component Type RED CELLS,LR    Unit division 00    Status of Unit ISSUED    Transfusion Status OK TO TRANSFUSE    Crossmatch Result Compatible    Unit Number [id number]    Blood Component Type RED CELLS,LR    Unit division 00    Status of Unit ISSUED    Transfusion Status OK TO TRANSFUSE    Crossmatch Result Compatible   Prepare RBC     Status: None   Collection Time: 10/16/16 10:36 AM  Result Value Ref Range   Order  Confirmation ORDER PROCESSED BY BLOOD BANK   Glucose, capillary     Status: Abnormal   Collection Time: 10/16/16 11:48 AM  Result Value Ref Range   Glucose-Capillary 112 (H) 65 - 99 mg/dL   Comment 1 Notify RN   Vitamin B12     Status: None   Collection Time: 10/16/16  1:53 PM  Result Value Ref Range   Vitamin B-12 474 180 - 914 pg/mL    Comment: (NOTE) This assay is not validated for testing neonatal or myeloproliferative syndrome specimens for Vitamin B12 levels. Performed at [facility]   Folate     Status: None   Collection Time: 10/16/16  1:53 PM  Result Value Ref Range   Folate 29.8 >5.9 ng/mL    Comment: Performed at [facility]  Iron and TIBC     Status: Abnormal   Collection Time: 10/16/16  1:53 PM  Result Value Ref Range   Iron 12 (L) 45 - 182 ug/dL   TIBC 108 (L) 250 - 450 ug/dL   Saturation Ratios 11 (L) 17.9 - 39.5 %   UIBC 96 ug/dL    Comment: Performed at [facility]  Ferritin     Status: Abnormal   Collection Time: 10/16/16  1:53 PM  Result Value Ref Range   Ferritin 559 (H) 24 - 336 ng/mL    Comment: Performed at [facility]  Reticulocytes     Status: Abnormal   Collection Time: 10/16/16  1:53 PM  Result Value Ref Range   Retic Ct Pct 1.7 0.4 - 3.1 %   RBC. 2.30 (L) 4.22 - 5.81 MIL/uL   Retic Count, Manual 39.1 19.0 - 186.0 K/uL  Strep pneumoniae urinary antigen     Status: None   Collection Time: 10/16/16  4:45 PM  Result Value Ref Range   Strep Pneumo Urinary Antigen NEGATIVE NEGATIVE    Comment: PERFORMED AT [facility]        Infection due to S. pneumoniae cannot be absolutely ruled out since the antigen present may be below the detection limit of the test. Performed at [facility]   Glucose, capillary     Status: Abnormal   Collection Time: 10/16/16  9:05 PM  Result Value Ref Range  Glucose-Capillary 131 (H) 65 - 99 mg/dL   Comment 1 Notify RN    Comment 2 Document in Chart   Occult blood  card to lab, stool     Status: None   Collection Time: 10/16/16 11:30 PM  Result Value Ref Range   Fecal Occult Bld NEGATIVE NEGATIVE  MRSA PCR Screening     Status: Abnormal   Collection Time: 10/17/16 12:53 AM  Result Value Ref Range   MRSA by PCR POSITIVE (A) NEGATIVE    Comment:        The GeneXpert MRSA Assay (FDA approved for NASAL specimens only), is one component of a comprehensive MRSA colonization surveillance program. It is not intended to diagnose MRSA infection nor to guide or monitor treatment for MRSA infections. RESULT CALLED TO, READ BACK BY AND VERIFIED WITH: THOMAS K AT 0301 ON [id number] BY FORSYTH K   CBC     Status: Abnormal   Collection Time: 10/17/16  6:10 AM  Result Value Ref Range   WBC 20.3 (H) 4.0 - 10.5 K/uL   RBC 3.24 (L) 4.22 - 5.81 MIL/uL   Hemoglobin 9.5 (L) 13.0 - 17.0 g/dL    Comment: DELTA CHECK NOTED POST TRANSFUSION SPECIMEN    HCT 30.1 (L) 39.0 - 52.0 %   MCV 92.9 78.0 - 100.0 fL   MCH 29.3 26.0 - 34.0 pg   MCHC 31.6 30.0 - 36.0 g/dL   RDW 17.8 (H) 11.5 - 15.5 %   Platelets 142 (L) 150 - 400 K/uL  Basic metabolic panel     Status: Abnormal   Collection Time: 10/17/16  6:10 AM  Result Value Ref Range   Sodium 135 135 - 145 mmol/L   Potassium 4.1 3.5 - 5.1 mmol/L   Chloride 107 101 - 111 mmol/L   CO2 20 (L) 22 - 32 mmol/L   Glucose, Bld 145 (H) 65 - 99 mg/dL   BUN 80 (H) 6 - 20 mg/dL   Creatinine, Ser 1.72 (H) 0.61 - 1.24 mg/dL   Calcium 8.4 (L) 8.9 - 10.3 mg/dL   GFR calc non Af Amer 39 (L) >60 mL/min   GFR calc Af Amer 46 (L) >60 mL/min    Comment: (NOTE) The eGFR has been calculated using the CKD EPI equation. This calculation has not been validated in all clinical situations. eGFR's persistently <60 mL/min signify possible Chronic Kidney Disease.    Anion gap 8 5 - 15  Glucose, capillary     Status: Abnormal   Collection Time: 10/17/16  7:45 AM  Result Value Ref Range   Glucose-Capillary 128 (H) 65 - 99 mg/dL     ABGS No results for input(s): PHART, PO2ART, TCO2, HCO3 in the last 72 hours.  Invalid input(s): PCO2 CULTURES Recent Results (from the past 240 hour(s))  Blood Culture (routine x 2)     Status: None (Preliminary result)   Collection Time: 10/15/16  4:01 AM  Result Value Ref Range Status   Specimen Description BLOOD RIGHT HAND  Final   Special Requests BOTTLES DRAWN AEROBIC AND ANAEROBIC [place]  Final   Culture NO GROWTH 1 DAY  Final   Report Status PENDING  Incomplete  Blood Culture (routine x 2)     Status: None (Preliminary result)   Collection Time: 10/15/16  4:18 AM  Result Value Ref Range Status   Specimen Description LEFT ANTECUBITAL  Final   Special Requests BOTTLES DRAWN AEROBIC AND ANAEROBIC [place]  Final   Culture NO  GROWTH 1 DAY  Final   Report Status PENDING  Incomplete  MRSA PCR Screening     Status: Abnormal   Collection Time: 10/17/16 12:53 AM  Result Value Ref Range Status   MRSA by PCR POSITIVE (A) NEGATIVE Final    Comment:        The GeneXpert MRSA Assay (FDA approved for NASAL specimens only), is one component of a comprehensive MRSA colonization surveillance program. It is not intended to diagnose MRSA infection nor to guide or monitor treatment for MRSA infections. RESULT CALLED TO, READ BACK BY AND VERIFIED WITH: THOMAS K AT 0301 ON [id number] BY FORSYTH K    Studies/Results: Ct Chest Wo Contrast  Result Date: 10/16/2016 CLINICAL DATA:  HCAP -seen on CT abdomen/pelvis done 10/15/16. Hx of recent left leg amputation,loculated pleural effusion 09/09/16. Hx of rheumatoid arthritis,HTN,CAD,DM/bbj EXAM: CT CHEST WITHOUT CONTRAST TECHNIQUE: Multidetector CT imaging of the chest was performed following the standard protocol without IV contrast. COMPARISON:  CT of the abdomen and pelvis 10/15/2016 FINDINGS: Cardiovascular: Heart is mildly enlarged. There is extensive coronary artery calcification. Calcification at the root of the aorta. Normal  noncontrast appearance of the pulmonary arteries. Mediastinum/Nodes: Status post median sternotomy. The visualized portion of the thyroid gland has a normal appearance. Esophagus is normal in appearance. Lungs/Pleura: Small right pleural effusion is present. There is consolidation other right lower lobe. Small nodular opacities are identified within the lung bases, right middle lobe, and lingula are favored to be infectious or inflammatory. Airways are patent. Upper Abdomen: Numerous layering calcified gallstones are present. There is extensive atherosclerotic calcification the branch vessels in the upper abdomen. Calcifications in the kidneys may be vascular calcifications or calculi. Musculoskeletal: No chest wall mass or suspicious bone lesions identified. Chronic changes are seen in both shoulders consistent with rotator cuff injury and glenohumeral osteoarthritis. IMPRESSION: 1. Right pleural effusion and right lower lobe consolidation. 2. Small nodular opacities within the lungs are likely infectious or inflammatory. 3. Coronary artery disease. 4. Status post median sternotomy. 5. Cholelithiasis. 6. Renal arterial calcifications versus intrarenal stones. Electronically Signed   By: Nolon Nations M.D.   On: 10/16/2016 11:29    Medications:  Prior to Admission:  Prescriptions Prior to Admission  Medication Sig Dispense Refill Last Dose  . acetaminophen (TYLENOL) 325 MG tablet Take 650 mg by mouth every 6 (six) hours as needed for mild pain or moderate pain.    unknown  . ALPRAZolam (XANAX) 0.25 MG tablet Take 1 tablet (0.25 mg total) by mouth 3 (three) times daily as needed for anxiety. 30 tablet 0 10/14/2016 at Unknown time  . Amino Acids-Protein Hydrolys (PRO-STAT 64 PO) Take 30 mLs by mouth 2 (two) times daily.   10/14/2016 at Unknown time  . Artificial Tear Ointment (REFRESH P.M. OP) Apply 1 drop to eye daily as needed (FOR DY EYES). ONE APPLICATION BOTH EYES. For dry eyes not relieved by  theratears every 6 hours    unknown  . aspirin 81 MG chewable tablet Chew 81 mg by mouth every morning.    10/14/2016 at Unknown time  . atorvastatin (LIPITOR) 80 MG tablet Take 80 mg by mouth daily.   10/14/2016 at Unknown time  . Balsam [place]-Castor Oil (VENELEX) OINT Apply 1 application topically 3 (three) times daily. Apply to sacrum every shift day, evening, Night and as needed.   10/14/2016 at Unknown time  . Carboxymethylcellulose Sodium (THERATEARS) 0.25 % SOLN Apply 2 drops to both eyes three times a day  10/14/2016 at Unknown time  . collagenase (SANTYL) ointment Apply 1 application topically daily. Apply to residual surgical site per tx orders   10/14/2016 at Unknown time  . docusate sodium (COLACE) 100 MG capsule Take 100 mg by mouth 2 (two) times daily.   10/14/2016 at Unknown time  . hydroxychloroquine (PLAQUENIL) 200 MG tablet Take 200 mg by mouth daily.    10/14/2016 at Unknown time  . insulin NPH Human (HUMULIN N) 100 UNIT/ML injection Give 5 units subcutaneous twice a day   10/14/2016 at Unknown time  . iron polysaccharides (NIFEREX) 150 MG capsule Take 150 mg by mouth daily.   10/14/2016 at Unknown time  . levalbuterol (XOPENEX) 0.63 MG/3ML nebulizer solution Take 3 mLs (0.63 mg total) by nebulization every 4 (four) hours as needed for wheezing or shortness of breath.   unknown  . Melatonin 3 MG TABS Take 2 tablets by mouth at bedtime   10/14/2016 at Unknown time  . metoprolol succinate (TOPROL-XL) 25 MG 24 hr tablet Take 1.5 tablets (37.5 mg total) by mouth every morning.   10/14/2016 at 0900  . Multiple Vitamins-Minerals (THERA-M ENHANCED PO) 1 tablet by mouth daily   10/14/2016 at Unknown time  . nystatin cream (MYCOSTATIN) Apply 1 application topically daily. Apply to bottom rash QD    10/14/2016 at Unknown time  . olopatadine (PATANOL) 0.1 % ophthalmic solution Place 1 drop into both eyes 2 (two) times daily.   10/14/2016 at Unknown time  . omega-3 acid ethyl esters (LOVAZA) 1  g capsule Take 2 g by mouth 2 (two) times daily.   10/14/2016 at Unknown time  . oxyCODONE (OXY IR/ROXICODONE) 5 MG immediate release tablet Take one tablet by mouth every 6 hours as needed for pain 120 tablet 0 10/14/2016 at Unknown time  . polyethylene glycol powder (MIRALAX) powder Take 17 g by mouth 2 (two) times daily.   10/14/2016 at Unknown time  . predniSONE (DELTASONE) 2.5 MG tablet Take 3 tablets (7.5 mg total) by mouth daily with breakfast. 30 tablet 1 10/14/2016 at Unknown time  . pregabalin (LYRICA) 25 MG capsule Take one capsule by mouth at bedtime 30 capsule 0 10/14/2016 at Unknown time  . Tamsulosin HCl (FLOMAX) 0.4 MG CAPS Take 0.4 mg by mouth at bedtime.    10/14/2016 at Unknown time  . thiamine (VITAMIN B-1) 100 MG tablet Take 100 mg by mouth daily.   10/14/2016 at Unknown time  . torsemide (DEMADEX) 10 MG tablet Give 20 mg by mouth once a day every other day   unknown  . traZODone (DESYREL) 50 MG tablet Take 50 mg by mouth at bedtime.   10/14/2016 at Unknown time  . vitamin C (ASCORBIC ACID) 500 MG tablet Take 500 mg by mouth daily.   10/14/2016 at Unknown time  . zinc sulfate 220 (50 Zn) MG capsule Take 220 mg by mouth daily.   10/14/2016 at Unknown time  . OXYGEN Inhale 2 L into the lungs continuous. O 2 at 2 LPM via n/c to keep O2 sat above 90 % every shift    Taking   Scheduled: . aspirin  81 mg Oral q morning - 10a  . atorvastatin  80 mg Oral Daily  . Chlorhexidine Gluconate Cloth  6 each Topical Q0600  . collagenase   Topical Daily  . docusate sodium  100 mg Oral BID  . enoxaparin (LOVENOX) injection  40 mg Subcutaneous Q24H  . feeding supplement (PRO-STAT SUGAR FREE 64)  30 mL Oral  BID  . hydroxychloroquine  200 mg Oral Daily  . insulin aspart  0-9 Units Subcutaneous TID WC  . metoprolol succinate  37.5 mg Oral q morning - 10a  . mupirocin ointment  1 application Nasal BID  . olopatadine  1 drop Both Eyes BID  . omega-3 acid ethyl esters  2 g Oral BID  .  piperacillin-tazobactam (ZOSYN)  IV  3.375 g Intravenous Q8H  . polyethylene glycol  17 g Oral BID  . predniSONE  7.5 mg Oral Q breakfast  . pregabalin  25 mg Oral Daily  . sodium chloride  500 mL Intravenous Once  . tamsulosin  0.4 mg Oral QHS  . thiamine  100 mg Oral Daily  . vancomycin  1,250 mg Intravenous Q24H  . vitamin C  500 mg Oral Daily  . zinc sulfate  220 mg Oral Daily   Continuous: . sodium chloride 0.9 % 1,000 mL infusion 10 mL/hr at 10/15/16 0745   NGI:[id number], ALPRAZolam, HYDROcodone-acetaminophen, zolpidem  Assesment:He has healthcare associated pneumonia. He has urinary tract infection probably related to his chronic indwelling Foley catheter. CT by my reading has less concern for empyema. He has a small right pleural effusion but this does not have a lot of internal  septations Active Problems:   Insulin dependent diabetes mellitus ([place])   HCAP (healthcare-associated pneumonia)   Urinary tract infection without hematuria   Sepsis ([place])    Plan: No change in treatments I don't think he needs further evaluation of the pleural effusion at this point unless he does not improve. Continue with IV antibiotics.    LOS: 2 days   Yossi Hinchman L 10/17/2016, 8:25 AM

## 2016-10-17 NOTE — Clinical Social Work Note (Signed)
Clinical Social Work Assessment  Patient Details  Name: [name] MRN: [id number] Date of Birth: Jul 02, 1951  Date of referral:  10/17/16               Reason for consult:  Discharge Planning                Permission sought to share information with:  Family Supports Permission granted to share information::  Yes, Verbal Permission Granted  Name::     [name]  Agency::     Relationship::  spouse  Contact Information:     Housing/Transportation Living arrangements for the past 2 months:  [place] of Information:  Patient, Spouse Patient Interpreter Needed:  None Criminal Activity/Legal Involvement Pertinent to Current Situation/Hospitalization:  No - Comment as needed Significant Relationships:  Spouse, Adult Children Lives with:  Facility Resident Do you feel safe going back to the place where you live?  Yes Need for family participation in patient care:  Yes (Comment)  Care giving concerns:  Pt from SNF and requires extensive care with ADLs and wounds.    Social Worker assessment / plan:  CSW met with pt and pt's wife at bedside. Pt has been a resident at [facility] for a few months. He had been on dialysis, but no longer requires this. He is no longer receiving therapy at facility and is nursing level of care. Pt has applied for Medicaid. Per admissions at [facility], facility is not willing for pt to return and have notified pt and wife of this decision. Pt and pt's wife report there have been some issues/concerns at facility, but they were hopeful that pt could return until they have their home ready. They indicate that some changes need to be done to accommodate him there. Discussed situation with supervisor. CSW offered placement at another SNF. Wife states they will talk with family to determine whether this will be needed or whether they will prepare to take him home. CSW will check back this afternoon.   Employment status:  Retired Forensic scientist:   Medicare PT Recommendations:  Not assessed at this time [place] / Referral to community resources:  Other (Comment Required) (To be determined)  Patient/Family's Response to care:  Pt and wife understanding that he cannot return to [facility] and will determine whether plan will be to return home or look for new SNF.   Patient/Family's Understanding of and Emotional Response to Diagnosis, Current Treatment, and Prognosis:  Pt and wife are aware of admission diagnosis and treatment plan.   Emotional Assessment Appearance:  Appears stated age Attitude/Demeanor/Rapport:  Other (Cooperative) Affect (typically observed):  Appropriate Orientation:  Oriented to Self, Oriented to Place, Oriented to  Time, Oriented to Situation Alcohol / Substance use:  Not Applicable Psych involvement (Current and /or in the community):  No (Comment)  Discharge Needs  Concerns to be addressed:  Discharge Planning Concerns Readmission within the last 30 days:  No Current discharge risk:  Physical Impairment Barriers to Discharge:  Continued Medical Work up   Salome Arnt, [place] 10/17/2016, 12:42 PM [phone]

## 2016-10-17 NOTE — Telephone Encounter (Signed)
[facility] #[phone] Fax: [phone]   

## 2016-10-17 NOTE — Progress Notes (Signed)
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 He had an indwelling catheter on arrival, which he stated made him feel a sense of frequent urination. He was found to have sepsis related to possible UTI and HCAP. He has been started on IV antibiotics.   Assessment & Plan:   Active Problems:   Insulin dependent diabetes mellitus ([place])   HCAP (healthcare-associated pneumonia)   Urinary tract infection without hematuria   Sepsis ([place])  1. Sepsis. Patient has been bolused IV fluids, per sepsis protocol. Blood pressures have improved. Blood cultures have shown no growth. Continue IV antibiotics. 2. UTI without hematuria. Patient was started on IV antibiotics. Patient has indwelling foley catheter. Urine culture positive for Pseudomonas. Follow-up sensitivities 3. HCAP. CT abdomen had shown evidence of RLL pneumonia and effusion. Patient had dedicated CT chest that showed persistent right lower lobe consolidation and associated effusion. Pulmonology following and feels that it is less likely to be an empyema. Recommendations are to continue current antibiotics, vancomycin and Zosyn.. 4. Chronic combined CHF with EF 30-35%. Volume status appears to be stable. Demadex currently on hold due to sepsis. Continue to follow. 5. CKD stage 3. Creatinine is at baseline. Continue to monitor input and output. 6. Anemia. No reported evidence of bleeding. Possibly related to dilution as well as chronic disease. Stool negative for occult blood. Anemia panel reviewed and likely a combination of iron deficiency and chronic disease. He was transfused 2 unit prbc won 10/22 with symptomatic improvement.  7. Rheumatoid arthritis. Steroid dependent.  Continue current outpatient regimen of prednisone and plaquenil. Will monitor closely. 8. IDDM. Blood sugars currently stable with SSI. Continue to monitor 9. HTN. Currently stable. Continue Toprol. 10. HLD. Continue statin. 11. BPH with indwelling foley catheter. Continue Flomax. 12. Constipation. Patient has had multiple bowel movements. Continue Miramax. 13. Left BKA wound. Per [place] nurse, will continue outpatient care regimen and implement enzymatic debridement using Santyl. A pressure redistribution heel boot has been provided for the right heel and a prophylactic dressing has been ordered for the coccyx. Patient has been advised to turn and reposition to relieve pressure to the coccyx.  DVT prophylaxis: Lovenox  Code Status: Full Family Communication:No family bedside Disposition Plan: Discharge back to SNF once improved  Consultants:   Pulmonology  Wound care  Procedures:   None  Antimicrobials:   Vancomycin 10/21 >>  Zosyn 10/22 >>   Subjective: Feeling better after transfusion of prbc. Not as weak. No shortness of breath  Objective: Vitals:   10/17/16 0220 10/17/16 0305 10/17/16 0537 10/17/16 0701  BP: (!) 128/50 (!) 130/56 107/68   Pulse: 68 67 (!) 119   Resp:   19   Temp: 97.5 F (36.4 C) 98.1 F (36.7 C) 97.7 F (36.5 C) (!) 87 F (30.6 C)  TempSrc: Oral Oral Oral   SpO2: 100% 100% 100%   Weight:        Intake/Output Summary (Last 24 hours) at 10/17/16 0756 Last data filed at 10/17/16 0537  Gross per 24 hour  Intake             1664 ml  Output              601  ml  Net             1063 ml   Filed Weights   10/15/16 0700  Weight: 82 kg (180 lb 12.4 oz)    Examination:  General exam: Appears calm and comfortable  Respiratory system: Clear to auscultation. Respiratory effort normal. Cardiovascular system: S1 & S2 heard, RRR. No JVD, murmurs, rubs, gallops or clicks. No pedal edema. Gastrointestinal system: Abdomen is nondistended, soft and  nontender. No organomegaly or masses felt. Normal bowel sounds heard. Central nervous system: Alert and oriented. No focal neurological deficits. Extremities: left BKA Skin: No rashes, lesions or ulcers Psychiatry: Judgement and insight appear normal. Mood & affect appropriate.     Data Reviewed: I have personally reviewed following labs and imaging studies  CBC:  Recent Labs Lab 10/11/16 0500 10/15/16 0219 10/16/16 0524 10/17/16 0610  WBC 8.4 18.5* 23.7* 20.3*  NEUTROABS 4.3 13.1*  --   --   HGB 7.8* 9.1* 7.1* 9.5*  HCT 24.4* 28.9* 22.1* 30.1*  MCV 95.3 96.0 97.8 92.9  PLT 151 199 138* 366*   Basic Metabolic Panel:  Recent Labs Lab 10/11/16 0500 10/15/16 0219 10/16/16 0524 10/17/16 0610  NA 138 137 138 135  K 4.0 4.6 4.3 4.1  CL 102 101 109 107  CO2 27 27 23  20*  GLUCOSE 114* 182* 97 145*  BUN 97* 83* 80* 80*  CREATININE 1.83* 2.08* 1.88* 1.72*  CALCIUM 8.6* 8.9 8.3* 8.4*   GFR: Estimated Creatinine Clearance: 41.7 mL/min (by C-G formula based on SCr of 1.72 mg/dL (H)). Liver Function Tests:  Recent Labs Lab 10/11/16 0500 10/15/16 0219  AST 21 38  ALT 23 40  ALKPHOS 295* 390*  BILITOT 0.5 0.7  PROT 5.9* 6.8  ALBUMIN 2.4* 2.7*   No results for input(s): LIPASE, AMYLASE in the last 168 hours. No results for input(s): AMMONIA in the last 168 hours. Coagulation Profile: No results for input(s): INR, PROTIME in the last 168 hours. Cardiac Enzymes: No results for input(s): CKTOTAL, CKMB, CKMBINDEX, TROPONINI in the last 168 hours. BNP (last 3 results) No results for input(s): PROBNP in the last 8760 hours. HbA1C:  Recent Labs  10/15/16 0219  HGBA1C 6.3*   CBG:  Recent Labs Lab 10/15/16 2142 10/16/16 0803 10/16/16 1148 10/16/16 2105 10/17/16 0745  GLUCAP 138* 84 112* 131* 128*   Lipid Profile: No results for input(s): CHOL, HDL, LDLCALC, TRIG, CHOLHDL, LDLDIRECT in the last 72 hours. Thyroid Function Tests: No results for input(s): TSH,  T4TOTAL, FREET4, T3FREE, THYROIDAB in the last 72 hours. Anemia Panel:  Recent Labs  10/16/16 1353  VITAMINB12 474  FOLATE 29.8  FERRITIN 559*  TIBC 108*  IRON 12*  RETICCTPCT 1.7   Sepsis Labs:  Recent Labs Lab 10/15/16 0234 10/15/16 0605  LATICACIDVEN 2.32* 1.19    Recent Results (from the past 240 hour(s))  Blood Culture (routine x 2)     Status: None (Preliminary result)   Collection Time: 10/15/16  4:01 AM  Result Value Ref Range Status   Specimen Description BLOOD RIGHT HAND  Final   Special Requests BOTTLES DRAWN AEROBIC AND ANAEROBIC 6CC EACH  Final   Culture NO GROWTH 1 DAY  Final   Report Status PENDING  Incomplete  Blood Culture (routine x 2)     Status: None (Preliminary result)   Collection Time: 10/15/16  4:18 AM  Result Value Ref Range Status   Specimen Description LEFT ANTECUBITAL  Final   Special Requests BOTTLES DRAWN  AEROBIC AND ANAEROBIC 8CC EACH  Final   Culture NO GROWTH 1 DAY  Final   Report Status PENDING  Incomplete  MRSA PCR Screening     Status: Abnormal   Collection Time: 10/17/16 12:53 AM  Result Value Ref Range Status   MRSA by PCR POSITIVE (A) NEGATIVE Final    Comment:        The GeneXpert MRSA Assay (FDA approved for NASAL specimens only), is one component of a comprehensive MRSA colonization surveillance program. It is not intended to diagnose MRSA infection nor to guide or monitor treatment for MRSA infections. RESULT CALLED TO, READ BACK BY AND VERIFIED WITH: THOMAS K AT 0301 ON [id number] BY FORSYTH K          Radiology Studies: Ct Chest Wo Contrast  Result Date: 10/16/2016 CLINICAL DATA:  HCAP -seen on CT abdomen/pelvis done 10/15/16. Hx of recent left leg amputation,loculated pleural effusion 09/09/16. Hx of rheumatoid arthritis,HTN,CAD,DM/bbj EXAM: CT CHEST WITHOUT CONTRAST TECHNIQUE: Multidetector CT imaging of the chest was performed following the standard protocol without IV contrast. COMPARISON:  CT of the abdomen  and pelvis 10/15/2016 FINDINGS: Cardiovascular: Heart is mildly enlarged. There is extensive coronary artery calcification. Calcification at the root of the aorta. Normal noncontrast appearance of the pulmonary arteries. Mediastinum/Nodes: Status post median sternotomy. The visualized portion of the thyroid gland has a normal appearance. Esophagus is normal in appearance. Lungs/Pleura: Small right pleural effusion is present. There is consolidation other right lower lobe. Small nodular opacities are identified within the lung bases, right middle lobe, and lingula are favored to be infectious or inflammatory. Airways are patent. Upper Abdomen: Numerous layering calcified gallstones are present. There is extensive atherosclerotic calcification the branch vessels in the upper abdomen. Calcifications in the kidneys may be vascular calcifications or calculi. Musculoskeletal: No chest wall mass or suspicious bone lesions identified. Chronic changes are seen in both shoulders consistent with rotator cuff injury and glenohumeral osteoarthritis. IMPRESSION: 1. Right pleural effusion and right lower lobe consolidation. 2. Small nodular opacities within the lungs are likely infectious or inflammatory. 3. Coronary artery disease. 4. Status post median sternotomy. 5. Cholelithiasis. 6. Renal arterial calcifications versus intrarenal stones. Electronically Signed   By: Nolon Nations M.D.   On: 10/16/2016 11:29        Scheduled Meds: . aspirin  81 mg Oral q morning - 10a  . atorvastatin  80 mg Oral Daily  . Chlorhexidine Gluconate Cloth  6 each Topical Q0600  . collagenase   Topical Daily  . docusate sodium  100 mg Oral BID  . enoxaparin (LOVENOX) injection  40 mg Subcutaneous Q24H  . feeding supplement (PRO-STAT SUGAR FREE 64)  30 mL Oral BID  . hydroxychloroquine  200 mg Oral Daily  . insulin aspart  0-9 Units Subcutaneous TID WC  . metoprolol succinate  37.5 mg Oral q morning - 10a  . mupirocin ointment  1  application Nasal BID  . olopatadine  1 drop Both Eyes BID  . omega-3 acid ethyl esters  2 g Oral BID  . piperacillin-tazobactam (ZOSYN)  IV  3.375 g Intravenous Q8H  . polyethylene glycol  17 g Oral BID  . predniSONE  7.5 mg Oral Q breakfast  . pregabalin  25 mg Oral Daily  . sodium chloride  500 mL Intravenous Once  . tamsulosin  0.4 mg Oral QHS  . thiamine  100 mg Oral Daily  . vancomycin  1,250 mg Intravenous Q24H  . vitamin C  500  mg Oral Daily  . zinc sulfate  220 mg Oral Daily   Continuous Infusions: . sodium chloride 0.9 % 1,000 mL infusion 10 mL/hr at 10/15/16 0745     LOS: 2 days    Time spent: 25 minutes    Kathie Dike, MD [facility]  If 7PM-7AM, please contact night-coverage [email or web address] Password [email or web address] 10/17/2016, 7:56 AM

## 2016-10-18 ENCOUNTER — Other Ambulatory Visit: Payer: Self-pay

## 2016-10-18 LAB — BASIC METABOLIC PANEL
ANION GAP: 5 (ref 5–15)
BUN: 67 mg/dL — ABNORMAL HIGH (ref 6–20)
CO2: 23 mmol/L (ref 22–32)
Calcium: 8.3 mg/dL — ABNORMAL LOW (ref 8.9–10.3)
Chloride: 109 mmol/L (ref 101–111)
Creatinine, Ser: 1.62 mg/dL — ABNORMAL HIGH (ref 0.61–1.24)
GFR calc Af Amer: 49 mL/min — ABNORMAL LOW (ref >60)
GFR, EST NON AFRICAN AMERICAN: 42 mL/min — AB (ref >60)
GLUCOSE: 123 mg/dL — AB (ref 65–99)
POTASSIUM: 3.8 mmol/L (ref 3.5–5.1)
Sodium: 137 mmol/L (ref 135–145)

## 2016-10-18 LAB — TYPE AND SCREEN
ABO/RH(D): O POS
Antibody Screen: NEGATIVE
UNIT DIVISION: 0
Unit division: 0

## 2016-10-18 LAB — GLUCOSE, CAPILLARY
GLUCOSE-CAPILLARY: 117 mg/dL — AB (ref 65–99)
GLUCOSE-CAPILLARY: 191 mg/dL — AB (ref 65–99)
GLUCOSE-CAPILLARY: 168 mg/dL — AB (ref 65–99)
Glucose-Capillary: 184 mg/dL — ABNORMAL HIGH (ref 65–99)

## 2016-10-18 LAB — URINE CULTURE

## 2016-10-18 LAB — CBC
HCT: 28.6 % — ABNORMAL LOW (ref 39.0–52.0)
Hemoglobin: 9.1 g/dL — ABNORMAL LOW (ref 13.0–17.0)
MCH: 29.3 pg (ref 26.0–34.0)
MCHC: 31.8 g/dL (ref 30.0–36.0)
MCV: 92 fL (ref 78.0–100.0)
PLATELETS: 139 10*3/uL — AB (ref 150–400)
RBC: 3.11 MIL/uL — AB (ref 4.22–5.81)
RDW: 18.2 % — ABNORMAL HIGH (ref 11.5–15.5)
WBC: 13.5 10*3/uL — AB (ref 4.0–10.5)

## 2016-10-18 MED ORDER — OXYCODONE HCL 5 MG PO TABS
10.0000 mg | ORAL_TABLET | ORAL | Status: DC | PRN
  Administered 2016-10-18 – 2016-10-22 (×20): 10 mg via ORAL
  Filled 2016-10-18 (×20): qty 2

## 2016-10-18 MED ORDER — PIPERACILLIN-TAZOBACTAM 3.375 G IVPB
3.3750 g | Freq: Three times a day (TID) | INTRAVENOUS | Status: DC
  Administered 2016-10-18 – 2016-10-22 (×14): 3.375 g via INTRAVENOUS
  Filled 2016-10-18 (×14): qty 50

## 2016-10-18 MED ORDER — TRAZODONE HCL 50 MG PO TABS
50.0000 mg | ORAL_TABLET | Freq: Every evening | ORAL | Status: DC | PRN
  Administered 2016-10-18: 50 mg via ORAL
  Filled 2016-10-18: qty 1

## 2016-10-18 MED ORDER — OXYCODONE HCL 5 MG PO TABS: ORAL_TABLET | ORAL | 0 refills | Status: DC

## 2016-10-18 MED ORDER — OXYCODONE HCL 5 MG PO TABS
5.0000 mg | ORAL_TABLET | Freq: Four times a day (QID) | ORAL | Status: DC | PRN
  Administered 2016-10-18 (×2): 5 mg via ORAL
  Filled 2016-10-18 (×2): qty 1

## 2016-10-18 MED ORDER — VANCOMYCIN HCL IN DEXTROSE 1-5 GM/200ML-% IV SOLN
1000.0000 mg | INTRAVENOUS | Status: DC
  Administered 2016-10-19 – 2016-10-22 (×3): 1000 mg via INTRAVENOUS
  Filled 2016-10-18 (×3): qty 200

## 2016-10-18 NOTE — Care Management Note (Signed)
Case Management Note  Patient Details  Name: [name] MRN: [id number] Date of Birth: 09/03/1951  Subjective/Objective:  Patient adm from SNF with UTI and pneumonia. He will not be able to return to [facility], family discussing a search for new SNF placement or possibly taking patient home.     Action/Plan: CSW working with family on possible new SNF placement. Will follow for CM needs.    Expected Discharge Date:       10/19/2016           Expected Discharge Plan:  Skilled Nursing Facility  In-House Referral:  Clinical Social Work  Discharge planning Services  CM Consult  Post Acute Care Choice:  NA Choice offered to:  NA  DME Arranged:    DME Agency:     HH Arranged:    [place] Agency:     Status of Service:  In process, will continue to follow  If discussed at Long Length of Stay Meetings, dates discussed:    Additional Comments:  Jassen Sarver, Chauncey Reading, RN 10/18/2016, 2:58 PM

## 2016-10-18 NOTE — Progress Notes (Unsigned)
o

## 2016-10-18 NOTE — Progress Notes (Addendum)
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 He had an indwelling catheter on arrival, which he stated made him feel a sense of frequent urination. He was found to have sepsis related to possible UTI and HCAP. He has been started on IV antibiotics.  Assessment & Plan:   Active Problems:   Insulin dependent diabetes mellitus ([place])   HCAP (healthcare-associated pneumonia)   Urinary tract infection without hematuria   Sepsis ([place])  1. Sepsis. Patient has been bolused IV fluids, per sepsis protocol. Blood pressures have improved. Blood cultures have shown no growth. We'll continue IV antibiotics Zosyn and Vancomycin.  2. UTI without hematuria. Patient was started on IV antibiotics. Patient had an indwelling foley catheter placed on arrival. Urine culture was  positive for Pseudomonas. Will follow-up sensitivities.  3. HCAP. CT abdomen had shown evidence of RLL pneumonia and effusion. Patient had dedicated CT chest that showed persistent right lower lobe consolidation and associated effusion. Pulmonology following and feels that it is less likely to be an empyema. Recommendations are to continue current antibiotics, vancomycin and Zosyn. 4. Chronic combined CHFwith EF 30-35%. Volume status appears to be stable. Demadex currently on hold due to sepsis. Continue to follow. 5. CKD stage 3. Creatinine is at baseline. Continue to monitor input and output. 6. Normocytic anemia. No reported evidence of bleeding. Possibly related to dilution as well as chronic disease. Stool negative for occult blood. Anemia panel reviewed and likely a combination of iron deficiency and chronic disease. He was transfused 2 units of packed red blood  cells on 10/22 with symptomatic improvement. 7. Rheumatoid arthritis. He is steroid dependent. Continue current outpatient regimen of prednisone and plaquenil. Will monitor closely. 8. IDDM. Blood sugars are currently stable with sliding scale insulin. Continue to monitor 9. HTN. Pressures are stable. Continue Toprol. 10. HLD. Continue statin. 11. BPH with indwelling foley catheter. Continue Flomax. 12. Constipation. Patient has had multiple bowel movements. Continue Miramax. 13. Left BKA wound. Per [place] nurse, will continue outpatient care regimen and implement enzymatic debridement using Santyl. A pressure redistribution heel boot has been provided for the right heel and a prophylactic dressing has been ordered for the coccyx. Patient has been advised to turn and reposition to relieve pressure to the coccyx.  DVT prophylaxis: Lovenox  Code Status: Full  Family Communication: Discussed with wife at bedside  Disposition Plan: Discharge to SNF when improved.    Consultants:   Pulmonology   Wound care   Procedures:   Transfused 2 units of packed red blood cells on 10/22   Antimicrobials:   Vancomycin 10/21 >>>   Zosyn 10/22 >>   Subjective: Complains of diffuse joint pain related to rheumatoid. He is still coughing  Objective: Vitals:   10/17/16 1539 10/17/16 2038 10/17/16 2056 10/18/16 0558  BP: (!) 141/55 (!) 139/55  (!) 134/52  Pulse: 73 71  68  Resp: 18 18  18   Temp: 98.2 F (36.8 C) 98.2 F (36.8 C)  97.7 F (36.5 C)  TempSrc: Oral Oral  Oral  SpO2: 100% 100% 98% 99%  Weight:        Intake/Output Summary (Last 24 hours) at 10/18/16 6160 Last data filed at 10/18/16 0700  Gross  per 24 hour  Intake              580 ml  Output             2851 ml  Net            -2271 ml   Filed Weights   10/15/16 0700  Weight: 82 kg (180 lb 12.4 oz)    Examination:  General exam: Appears calm and comfortable  Respiratory system: Clear to auscultation. Respiratory effort  normal. Cardiovascular system: S1 & S2 heard, RRR. No JVD, murmurs, rubs, gallops or clicks. No pedal edema. Gastrointestinal system: Abdomen is nondistended, soft and nontender. No organomegaly or masses felt. Normal bowel sounds heard. Central nervous system: Alert and oriented. No focal neurological deficits. Extremities: Left BKA with wound Skin: No rashes, lesions or ulcers Psychiatry: Judgement and insight appear normal. Mood & affect appropriate.     Data Reviewed: I have personally reviewed following labs and imaging studies  CBC:  Recent Labs Lab 10/15/16 0219 10/16/16 0524 10/17/16 0610 10/18/16 0609  WBC 18.5* 23.7* 20.3* 13.5*  NEUTROABS 13.1*  --   --   --   HGB 9.1* 7.1* 9.5* 9.1*  HCT 28.9* 22.1* 30.1* 28.6*  MCV 96.0 97.8 92.9 92.0  PLT 199 138* 142* 478*   Basic Metabolic Panel:  Recent Labs Lab 10/15/16 0219 10/16/16 0524 10/17/16 0610 10/18/16 0609  NA 137 138 135 137  K 4.6 4.3 4.1 3.8  CL 101 109 107 109  CO2 27 23 20* 23  GLUCOSE 182* 97 145* 123*  BUN 83* 80* 80* 67*  CREATININE 2.08* 1.88* 1.72* 1.62*  CALCIUM 8.9 8.3* 8.4* 8.3*   GFR: Estimated Creatinine Clearance: 44.2 mL/min (by C-G formula based on SCr of 1.62 mg/dL (H)). Liver Function Tests:  Recent Labs Lab 10/15/16 0219  AST 38  ALT 40  ALKPHOS 390*  BILITOT 0.7  PROT 6.8  ALBUMIN 2.7*   No results for input(s): LIPASE, AMYLASE in the last 168 hours. No results for input(s): AMMONIA in the last 168 hours. Coagulation Profile: No results for input(s): INR, PROTIME in the last 168 hours. Cardiac Enzymes: No results for input(s): CKTOTAL, CKMB, CKMBINDEX, TROPONINI in the last 168 hours. BNP (last 3 results) No results for input(s): PROBNP in the last 8760 hours. HbA1C: No results for input(s): HGBA1C in the last 72 hours. CBG:  Recent Labs Lab 10/17/16 0745 10/17/16 1113 10/17/16 1633 10/17/16 2119 10/18/16 0745  GLUCAP 128* 166* 210* 207* 117*   Lipid  Profile: No results for input(s): CHOL, HDL, LDLCALC, TRIG, CHOLHDL, LDLDIRECT in the last 72 hours. Thyroid Function Tests: No results for input(s): TSH, T4TOTAL, FREET4, T3FREE, THYROIDAB in the last 72 hours. Anemia Panel:  Recent Labs  10/16/16 1353  VITAMINB12 474  FOLATE 29.8  FERRITIN 559*  TIBC 108*  IRON 12*  RETICCTPCT 1.7   Sepsis Labs:  Recent Labs Lab 10/15/16 0234 10/15/16 0605  LATICACIDVEN 2.32* 1.19           Radiology Studies: Ct Chest Wo Contrast  Result Date: 10/16/2016 CLINICAL DATA:  HCAP -seen on CT abdomen/pelvis done 10/15/16. Hx of recent left leg amputation,loculated pleural effusion 09/09/16. Hx of rheumatoid arthritis,HTN,CAD,DM/bbj EXAM: CT CHEST WITHOUT CONTRAST TECHNIQUE: Multidetector CT imaging of the chest was performed following the standard protocol without IV contrast. COMPARISON:  CT of the abdomen and pelvis 10/15/2016 FINDINGS: Cardiovascular: Heart is mildly enlarged. There is extensive coronary artery calcification. Calcification at the root  of the aorta. Normal noncontrast appearance of the pulmonary arteries. Mediastinum/Nodes: Status post median sternotomy. The visualized portion of the thyroid gland has a normal appearance. Esophagus is normal in appearance. Lungs/Pleura: Small right pleural effusion is present. There is consolidation other right lower lobe. Small nodular opacities are identified within the lung bases, right middle lobe, and lingula are favored to be infectious or inflammatory. Airways are patent. Upper Abdomen: Numerous layering calcified gallstones are present. There is extensive atherosclerotic calcification the branch vessels in the upper abdomen. Calcifications in the kidneys may be vascular calcifications or calculi. Musculoskeletal: No chest wall mass or suspicious bone lesions identified. Chronic changes are seen in both shoulders consistent with rotator cuff injury and glenohumeral osteoarthritis. IMPRESSION:  1. Right pleural effusion and right lower lobe consolidation. 2. Small nodular opacities within the lungs are likely infectious or inflammatory. 3. Coronary artery disease. 4. Status post median sternotomy. 5. Cholelithiasis. 6. Renal arterial calcifications versus intrarenal stones. Electronically Signed   By: Nolon Nations M.D.   On: 10/16/2016 11:29        Scheduled Meds: . aspirin  81 mg Oral q morning - 10a  . atorvastatin  80 mg Oral Daily  . Chlorhexidine Gluconate Cloth  6 each Topical Q0600  . collagenase   Topical Daily  . docusate sodium  100 mg Oral BID  . enoxaparin (LOVENOX) injection  40 mg Subcutaneous Q24H  . feeding supplement (PRO-STAT SUGAR FREE 64)  30 mL Oral BID  . hydroxychloroquine  200 mg Oral Daily  . insulin aspart  0-9 Units Subcutaneous TID WC  . metoprolol succinate  37.5 mg Oral q morning - 10a  . mupirocin ointment  1 application Nasal BID  . olopatadine  1 drop Both Eyes BID  . omega-3 acid ethyl esters  2 g Oral BID  . piperacillin-tazobactam (ZOSYN)  IV  3.375 g Intravenous Q8H  . polyethylene glycol  17 g Oral BID  . predniSONE  7.5 mg Oral Q breakfast  . pregabalin  25 mg Oral Daily  . sodium chloride  500 mL Intravenous Once  . tamsulosin  0.4 mg Oral QHS  . thiamine  100 mg Oral Daily  . [START ON 10/19/2016] vancomycin  1,000 mg Intravenous Q36H  . vitamin C  500 mg Oral Daily  . zinc sulfate  220 mg Oral Daily   Continuous Infusions: . sodium chloride 0.9 % 1,000 mL infusion 10 mL/hr at 10/15/16 0745     LOS: 3 days    Time spent: 25 minutes     Kathie Dike, MD [facility] If 7PM-7AM, please contact night-coverage [email or web address] Password [email or web address] 10/18/2016, 8:23 AM

## 2016-10-18 NOTE — Clinical Social Work Placement (Signed)
[name] defers to daughter for decisions regarding d/c planning. Per Santiago Glad, request to send referral to [facility].   CLINICAL SOCIAL WORK PLACEMENT  NOTE  Date:  10/18/2016  Patient Details  Name: [name] MRN: [id number] Date of Birth: 05-30-1951  Clinical Social Work is seeking post-discharge placement for this patient at the [address] level of care (*CSW will initial, date and re-position this form in  chart as items are completed):  Yes   Patient/family provided with [facility] Work Department's list of facilities offering this level of care within the geographic area requested by the patient (or if unable, by the patient's family).  Yes   Patient/family informed of their freedom to choose among providers that offer the needed level of care, that participate in Medicare, Medicaid or managed care program needed by the patient, have an available bed and are willing to accept the patient.  Yes   Patient/family informed of [facility]'s ownership interest in [facility] and [facility], as well as of the fact that they are under no obligation to receive care at these facilities.  PASRR submitted to EDS on       PASRR number received on       Existing PASRR number confirmed on 10/18/16     FL2 transmitted to all facilities in geographic area requested by pt/family on 10/18/16     FL2 transmitted to all facilities within larger geographic area on       Patient informed that his/her managed care company has contracts with or will negotiate with certain facilities, including the following:            Patient/family informed of bed offers received.  Patient chooses bed at       Physician recommends and patient chooses bed at      Patient to be transferred to   on  .  Patient to be transferred to facility by       Patient family notified on   of transfer.  Name of family member notified:        PHYSICIAN       Additional  Comment:    _______________________________________________ Salome Arnt, LCSW 10/18/2016, 11:21 AM

## 2016-10-18 NOTE — Telephone Encounter (Signed)
Rx faxed to [facility] at [phone].   [address] [place], [place] [address]  Phone #: [phone] 

## 2016-10-18 NOTE — Progress Notes (Signed)
Pharmacy Antibiotic Note  [name] is a 65 y.o. male admitted on 10/15/2016 with sepsis/ UTI/Pneumonia.   Pharmacy has been consulted for zosyn and vancomycin dosing. Antibiotic change to zosyn for aspiration PNA along with vancomycin Urine culture growing out Pseudomonas, sens to zosyn Vanc trough is elevated at 30 mcg/ml  Plan: Zosyn 3.375gm IV q8h EID over 4 hours Change Vancomycin 1000mg  IV q36h. Goal trough 15-32mcg/ml F/U cultures, V/S and monitor clinical progress   Weight: 180 lb 12.4 oz (82 kg)  Temp (24hrs), Avg:98 F (36.7 C), Min:97.7 F (36.5 C), Max:98.2 F (36.8 C)   Recent Labs Lab 10/15/16 0219 10/15/16 0234 10/15/16 0605 10/16/16 0524 10/17/16 0610 10/17/16 2046 10/18/16 0609  WBC 18.5*  --   --  23.7* 20.3*  --  13.5*  CREATININE 2.08*  --   --  1.88* 1.72*  --  1.62*  LATICACIDVEN  --  2.32* 1.19  --   --   --   --   VANCOTROUGH  --   --   --   --   --  30*  --     Estimated Creatinine Clearance: 44.2 mL/min (by C-G formula based on SCr of 1.62 mg/dL (H)).    Allergies  Allergen Reactions  . Sulfa Antibiotics Rash    Antimicrobials this admission: Vancomycin 10/21 >>  Cefepime 10/21 >> 10/22 Zosyn 10/22>>  Dose adjustments this admission: 10/24.  Vanc dose changed to 1 gm q36 hours  Microbiology results: 10/21 BCx: ngtd 10/21 UCx: Pseudomonas 10/21 Sputum: pending  08/26/16  MRSA PCR: positive  Thank you for allowing pharmacy to be a part of this patient's care.  Excell Seltzer, PharmD Clinical Pharmacist 10/18/2016 8:18 AM

## 2016-10-18 NOTE — NC FL2 (Signed)
[place] MEDICAID FL2 LEVEL OF CARE SCREENING TOOL     IDENTIFICATION  Patient Name: [name] Birthdate: 1951/04/08 Sex: male Admission Date (Current Location): 10/15/2016  [facility] [place] Number:  [employer] and Address:  [place] [address] [place]      Provider Number: [id number]  Attending Physician Name and Address:  Kathie Dike, MD  Relative Name and Phone Number:       Current Level of Care: Hospital Recommended Level of Care: [place] Prior Approval Number:    Date Approved/Denied:   PASRR Number: [id number]  Discharge Plan: SNF    Current Diagnoses: Patient Active Problem List   Diagnosis Date Noted  . Urinary tract infection without hematuria 10/15/2016  . Sepsis ([place]) 10/15/2016  . Loculated pleural effusion 09/09/2016  . Shortness of breath 09/08/2016  . Acute on chronic combined systolic and diastolic CHF (congestive heart failure) ([place]) 09/02/2016  . Acute renal failure superimposed on stage 3 chronic kidney disease ([place]) 09/02/2016  . HCAP (healthcare-associated pneumonia) 09/02/2016  . Hyperkalemia 09/02/2016  . Sacral decubitus ulcer 09/02/2016  . Pressure ulcer 08/27/2016  . ESRD needing dialysis ([place]) 08/26/2016  . Acute hematogenous osteomyelitis of left foot ([place]) 08/09/2016  . Cellulitis and abscess of lower extremity 05/29/2016  . Anemia of chronic disease 04/27/2016  . Status post amputation of toe of left foot ([place]) 04/27/2016  . Systolic heart failure ([place]) 07/09/2015  . Bradycardia 08/14/2014  . Prolonged QT interval 08/14/2014  . Gait disorder 08/14/2014  . Seizure ([place]) 08/13/2014  . Pancytopenia ([place]) 08/13/2014  . ARF (acute renal failure) ([place]) 08/13/2014  . Altered mental status 06/18/2014  . Closed dislocation of metatarsophalangeal (joint) 03/26/2013  . Insulin dependent diabetes mellitus ([place]) 12/11/2012  . Osteomyelitis of metatarsal ([place])  12/11/2012  . Hyperlipidemia 12/11/2012  . Coronary artery disease   . Rheumatoid arthritis ([place])   . Hypertension     Orientation RESPIRATION BLADDER Height & Weight     Self, Time, Situation, Place  O2 (2 L) Indwelling catheter Weight: 180 lb 12.4 oz (82 kg) Height:     BEHAVIORAL SYMPTOMS/MOOD NEUROLOGICAL BOWEL NUTRITION STATUS  Other (Comment) (none) Convulsions/Seizures (history) Incontinent Diet (Dysphagia 3 with thin liquids)  AMBULATORY STATUS COMMUNICATION OF NEEDS Skin   Total Care Verbally Bruising, Other (Comment) (Moisture associated skin damage to sacrum. Left BKA wound with santyl. BID dressing changes. See wound care note. Pressure redistribution boot.)                       Personal Care Assistance Level of Assistance  Bathing, Feeding, Dressing Bathing Assistance: Maximum assistance Feeding assistance: Limited assistance Dressing Assistance: Maximum assistance     Functional Limitations Info  Sight, Hearing, Speech Sight Info: Adequate Hearing Info: Adequate Speech Info: Adequate    SPECIAL CARE FACTORS FREQUENCY                       Contractures Contractures Info: Not present    Additional Factors Info  Insulin Sliding Scale, Psychotropic Code Status Info: Full code Allergies Info: Sulfa antibiotics Psychotropic Info: Xanax   Isolation Precautions Info: 10/17/16 MRSA Screen = Positive (cj)     Current Medications (10/18/2016):  This is the current hospital active medication list Current Facility-Administered Medications  Medication Dose Route Frequency Provider Last Rate Last Dose  . acetaminophen (TYLENOL) tablet 650 mg  650 mg Oral Q6H PRN Frederich Chick  Toney Sang, MD   650 mg at 10/16/16 2245  . ALPRAZolam Duanne Moron) tablet 0.25 mg  0.25 mg Oral TID PRN Oswald Hillock, MD   0.25 mg at 10/18/16 0850  . aspirin chewable tablet 81 mg  81 mg Oral q morning - 10a Oswald Hillock, MD   81 mg at 10/18/16 1052  . atorvastatin (LIPITOR) tablet 80 mg  80 mg  Oral Daily Oswald Hillock, MD   80 mg at 10/18/16 1051  . Chlorhexidine Gluconate Cloth 2 % PADS 6 each  6 each Topical Q0600 Kathie Dike, MD   6 each at 10/18/16 0600  . collagenase (SANTYL) ointment   Topical Daily Kathie Dike, MD      . docusate sodium (COLACE) capsule 100 mg  100 mg Oral BID Oswald Hillock, MD   100 mg at 10/18/16 1052  . enoxaparin (LOVENOX) injection 40 mg  40 mg Subcutaneous Q24H Oswald Hillock, MD   40 mg at 10/18/16 1052  . feeding supplement (PRO-STAT SUGAR FREE 64) liquid 30 mL  30 mL Oral BID Oswald Hillock, MD   30 mL at 10/17/16 2300  . hydroxychloroquine (PLAQUENIL) tablet 200 mg  200 mg Oral Daily Oswald Hillock, MD   200 mg at 10/18/16 1052  . insulin aspart (novoLOG) injection 0-9 Units  0-9 Units Subcutaneous TID WC Oswald Hillock, MD   3 Units at 10/17/16 1700  . metoprolol succinate (TOPROL-XL) 24 hr tablet 37.5 mg  37.5 mg Oral q morning - 10a Oswald Hillock, MD   37.5 mg at 10/18/16 1053  . mupirocin ointment (BACTROBAN) 2 % 1 application  1 application Nasal BID Kathie Dike, MD   1 application at 77/82/42 1109  . olopatadine (PATANOL) 0.1 % ophthalmic solution 1 drop  1 drop Both Eyes BID Oswald Hillock, MD   1 drop at 10/18/16 1106  . omega-3 acid ethyl esters (LOVAZA) capsule 2 g  2 g Oral BID Oswald Hillock, MD   2 g at 10/18/16 1053  . oxyCODONE (Oxy IR/ROXICODONE) immediate release tablet 5 mg  5 mg Oral Q6H PRN Kathie Dike, MD   5 mg at 10/18/16 1054  . piperacillin-tazobactam (ZOSYN) IVPB 3.375 g  3.375 g Intravenous Q8H Kathie Dike, MD   3.375 g at 10/18/16 1052  . polyethylene glycol (MIRALAX / GLYCOLAX) packet 17 g  17 g Oral BID Kathie Dike, MD   17 g at 10/18/16 1108  . predniSONE (DELTASONE) tablet 7.5 mg  7.5 mg Oral Q breakfast Oswald Hillock, MD   7.5 mg at 10/18/16 0818  . pregabalin (LYRICA) capsule 25 mg  25 mg Oral Daily Oswald Hillock, MD   25 mg at 10/18/16 1052  . sodium chloride 0.9 % 1,000 mL infusion   Intravenous Continuous Kathie Dike, MD 10 mL/hr at 10/15/16 0745    . sodium chloride 0.9 % bolus 500 mL  500 mL Intravenous Once Delora Fuel, MD      . tamsulosin [facility]) capsule 0.4 mg  0.4 mg Oral QHS Oswald Hillock, MD   0.4 mg at 10/17/16 2300  . thiamine (VITAMIN B-1) tablet 100 mg  100 mg Oral Daily Oswald Hillock, MD   100 mg at 10/18/16 1052  . [START ON 10/19/2016] vancomycin (VANCOCIN) IVPB 1000 mg/200 mL premix  1,000 mg Intravenous Q36H Kathie Dike, MD      . vitamin C (ASCORBIC ACID) tablet 500 mg  500  mg Oral Daily Oswald Hillock, MD   500 mg at 10/18/16 1053  . zinc sulfate capsule 220 mg  220 mg Oral Daily Oswald Hillock, MD   220 mg at 10/18/16 1052  . zolpidem (AMBIEN) tablet 5 mg  5 mg Oral QHS PRN Jeryl Columbia, NP   5 mg at 10/16/16 2245     Discharge Medications: Please see discharge summary for a list of discharge medications.  Relevant Imaging Results:  Relevant Lab Results:   Additional Information SSN: [id number]. From another SNF.  Benay Pike [place], [place]

## 2016-10-18 NOTE — Progress Notes (Signed)
Subjective: He says his breathing is pretty good. He feels like his pain is not well controlled at this time. No other new complaints. He is not coughing very much. His pain is diffuse and from his rheumatoid arthritis. No nausea or vomiting. No significant chest pain  Objective: Vital signs in last 24 hours: Temp:  [97.7 F (36.5 C)-98.2 F (36.8 C)] 97.7 F (36.5 C) (10/24 0558) Pulse Rate:  [68-73] 68 (10/24 0558) Resp:  [18] 18 (10/24 0558) BP: (134-141)/(52-55) 134/52 (10/24 0558) SpO2:  [98 %-100 %] 99 % (10/24 0558) Weight change:  Last BM Date: 10/17/16  Intake/Output from previous day: 10/23 0701 - 10/24 0700 In: 42 [P.O.:480; IV Piggyback:100] Out: 2851 [Urine:2850; Stool:1]  PHYSICAL EXAM General appearance: alert, cooperative and mild distress Resp: Mild rhonchi on the right Cardio: regular rate and rhythm, S1, S2 normal, no murmur, click, rub or gallop GI: soft, non-tender; bowel sounds normal; no masses,  no organomegaly Extremities: Significant changes of rheumatoid arthritis and 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 His skin is warm and dry Lab Results:  Results for orders placed or performed during the hospital encounter of 10/15/16 (from the past 48 hour(s))  Type and screen [facility]     Status: None   Collection Time: 10/16/16 10:36 AM  Result Value Ref Range   ABO/RH(D) O POS    Antibody Screen NEG    Sample Expiration 10/19/2016    Unit Number [id number]    Blood Component Type RED CELLS,LR    Unit division 00    Status of Unit ISSUED,FINAL    Transfusion Status OK TO TRANSFUSE    Crossmatch Result Compatible    Unit Number [id number]    Blood Component Type RED CELLS,LR    Unit division 00    Status of Unit ISSUED,FINAL    Transfusion Status OK TO TRANSFUSE    Crossmatch Result Compatible   Prepare RBC     Status: None   Collection Time: 10/16/16 10:36 AM  Result Value Ref Range   Order  Confirmation ORDER PROCESSED BY BLOOD BANK   Glucose, capillary     Status: Abnormal   Collection Time: 10/16/16 11:48 AM  Result Value Ref Range   Glucose-Capillary 112 (H) 65 - 99 mg/dL   Comment 1 Notify RN   Vitamin B12     Status: None   Collection Time: 10/16/16  1:53 PM  Result Value Ref Range   Vitamin B-12 474 180 - 914 pg/mL    Comment: (NOTE) This assay is not validated for testing neonatal or myeloproliferative syndrome specimens for Vitamin B12 levels. Performed at [facility]   Folate     Status: None   Collection Time: 10/16/16  1:53 PM  Result Value Ref Range   Folate 29.8 >5.9 ng/mL    Comment: Performed at [facility]  Iron and TIBC     Status: Abnormal   Collection Time: 10/16/16  1:53 PM  Result Value Ref Range   Iron 12 (L) 45 - 182 ug/dL   TIBC 108 (L) 250 - 450 ug/dL   Saturation Ratios 11 (L) 17.9 - 39.5 %   UIBC 96 ug/dL    Comment: Performed at [facility]  Ferritin     Status: Abnormal   Collection Time: 10/16/16  1:53 PM  Result Value Ref Range   Ferritin 559 (H) 24 - 336 ng/mL    Comment: Performed at [facility]  Reticulocytes     Status: Abnormal   Collection Time: 10/16/16  1:53 PM  Result Value Ref Range   Retic Ct Pct 1.7 0.4 - 3.1 %   RBC. 2.30 (L) 4.22 - 5.81 MIL/uL   Retic Count, Manual 39.1 19.0 - 186.0 K/uL  Strep pneumoniae urinary antigen     Status: None   Collection Time: 10/16/16  4:45 PM  Result Value Ref Range   Strep Pneumo Urinary Antigen NEGATIVE NEGATIVE    Comment: PERFORMED AT [facility]        Infection due to S. pneumoniae cannot be absolutely ruled out since the antigen present may be below the detection limit of the test. Performed at [facility]   Culture, Urine     Status: Abnormal   Collection Time: 10/16/16  4:45 PM  Result Value Ref Range   Specimen Description URINE, CLEAN CATCH    Special Requests NONE    Culture >=100,000 COLONIES/mL PSEUDOMONAS  AERUGINOSA (A)    Report Status 10/18/2016 FINAL    Organism ID, Bacteria PSEUDOMONAS AERUGINOSA (A)       Susceptibility   Pseudomonas aeruginosa - MIC*    CEFTAZIDIME 2 SENSITIVE Sensitive     CIPROFLOXACIN <=0.25 SENSITIVE Sensitive     GENTAMICIN <=1 SENSITIVE Sensitive     IMIPENEM 2 SENSITIVE Sensitive     PIP/TAZO 8 SENSITIVE Sensitive     CEFEPIME <=1 SENSITIVE Sensitive     * >=100,000 COLONIES/mL PSEUDOMONAS AERUGINOSA  Glucose, capillary     Status: Abnormal   Collection Time: 10/16/16  9:05 PM  Result Value Ref Range   Glucose-Capillary 131 (H) 65 - 99 mg/dL   Comment 1 Notify RN    Comment 2 Document in Chart   Occult blood card to lab, stool     Status: None   Collection Time: 10/16/16 11:30 PM  Result Value Ref Range   Fecal Occult Bld NEGATIVE NEGATIVE  MRSA PCR Screening     Status: Abnormal   Collection Time: 10/17/16 12:53 AM  Result Value Ref Range   MRSA by PCR POSITIVE (A) NEGATIVE    Comment:        The GeneXpert MRSA Assay (FDA approved for NASAL specimens only), is one component of a comprehensive MRSA colonization surveillance program. It is not intended to diagnose MRSA infection nor to guide or monitor treatment for MRSA infections. RESULT CALLED TO, READ BACK BY AND VERIFIED WITH: THOMAS K AT 0301 ON [id number] BY FORSYTH K   CBC     Status: Abnormal   Collection Time: 10/17/16  6:10 AM  Result Value Ref Range   WBC 20.3 (H) 4.0 - 10.5 K/uL   RBC 3.24 (L) 4.22 - 5.81 MIL/uL   Hemoglobin 9.5 (L) 13.0 - 17.0 g/dL    Comment: DELTA CHECK NOTED POST TRANSFUSION SPECIMEN    HCT 30.1 (L) 39.0 - 52.0 %   MCV 92.9 78.0 - 100.0 fL   MCH 29.3 26.0 - 34.0 pg   MCHC 31.6 30.0 - 36.0 g/dL   RDW 17.8 (H) 11.5 - 15.5 %   Platelets 142 (L) 150 - 400 K/uL  Basic metabolic panel     Status: Abnormal   Collection Time: 10/17/16  6:10 AM  Result Value Ref Range   Sodium 135 135 - 145 mmol/L   Potassium 4.1 3.5 - 5.1 mmol/L   Chloride 107 101 - 111  mmol/L   CO2 20 (L) 22 - 32 mmol/L  Glucose, Bld 145 (H) 65 - 99 mg/dL   BUN 80 (H) 6 - 20 mg/dL   Creatinine, Ser 1.72 (H) 0.61 - 1.24 mg/dL   Calcium 8.4 (L) 8.9 - 10.3 mg/dL   GFR calc non Af Amer 39 (L) >60 mL/min   GFR calc Af Amer 46 (L) >60 mL/min    Comment: (NOTE) The eGFR has been calculated using the CKD EPI equation. This calculation has not been validated in all clinical situations. eGFR's persistently <60 mL/min signify possible Chronic Kidney Disease.    Anion gap 8 5 - 15  Glucose, capillary     Status: Abnormal   Collection Time: 10/17/16  7:45 AM  Result Value Ref Range   Glucose-Capillary 128 (H) 65 - 99 mg/dL  Glucose, capillary     Status: Abnormal   Collection Time: 10/17/16 11:13 AM  Result Value Ref Range   Glucose-Capillary 166 (H) 65 - 99 mg/dL  Glucose, capillary     Status: Abnormal   Collection Time: 10/17/16  4:33 PM  Result Value Ref Range   Glucose-Capillary 210 (H) 65 - 99 mg/dL  Vancomycin, trough     Status: Abnormal   Collection Time: 10/17/16  8:46 PM  Result Value Ref Range   Vancomycin Tr 30 (HH) 15 - 20 ug/mL    Comment: CRITICAL RESULT CALLED TO, READ BACK BY AND VERIFIED WITH: LURZ,J AT 2220 ON 10/17/2016 BY ISLEY,B   Glucose, capillary     Status: Abnormal   Collection Time: 10/17/16  9:19 PM  Result Value Ref Range   Glucose-Capillary 207 (H) 65 - 99 mg/dL   Comment 1 Notify RN    Comment 2 Document in Chart   CBC     Status: Abnormal   Collection Time: 10/18/16  6:09 AM  Result Value Ref Range   WBC 13.5 (H) 4.0 - 10.5 K/uL   RBC 3.11 (L) 4.22 - 5.81 MIL/uL   Hemoglobin 9.1 (L) 13.0 - 17.0 g/dL   HCT 28.6 (L) 39.0 - 52.0 %   MCV 92.0 78.0 - 100.0 fL   MCH 29.3 26.0 - 34.0 pg   MCHC 31.8 30.0 - 36.0 g/dL   RDW 18.2 (H) 11.5 - 15.5 %   Platelets 139 (L) 150 - 400 K/uL  Basic metabolic panel     Status: Abnormal   Collection Time: 10/18/16  6:09 AM  Result Value Ref Range   Sodium 137 135 - 145 mmol/L   Potassium 3.8  3.5 - 5.1 mmol/L   Chloride 109 101 - 111 mmol/L   CO2 23 22 - 32 mmol/L   Glucose, Bld 123 (H) 65 - 99 mg/dL   BUN 67 (H) 6 - 20 mg/dL   Creatinine, Ser 1.62 (H) 0.61 - 1.24 mg/dL   Calcium 8.3 (L) 8.9 - 10.3 mg/dL   GFR calc non Af Amer 42 (L) >60 mL/min   GFR calc Af Amer 49 (L) >60 mL/min    Comment: (NOTE) The eGFR has been calculated using the CKD EPI equation. This calculation has not been validated in all clinical situations. eGFR's persistently <60 mL/min signify possible Chronic Kidney Disease.    Anion gap 5 5 - 15  Glucose, capillary     Status: Abnormal   Collection Time: 10/18/16  7:45 AM  Result Value Ref Range   Glucose-Capillary 117 (H) 65 - 99 mg/dL   Comment 1 Notify RN    Comment 2 Document in Chart     ABGS No  results for input(s): PHART, PO2ART, TCO2, HCO3 in the last 72 hours.  Invalid input(s): PCO2 CULTURES Recent Results (from the past 240 hour(s))  Urine culture     Status: Abnormal   Collection Time: 10/15/16  3:56 AM  Result Value Ref Range Status   Specimen Description URINE, CLEAN CATCH  Final   Special Requests NONE  Final   Culture >=100,000 COLONIES/mL PSEUDOMONAS AERUGINOSA (A)  Final   Report Status 10/18/2016 FINAL  Final   Organism ID, Bacteria PSEUDOMONAS AERUGINOSA (A)  Final      Susceptibility   Pseudomonas aeruginosa - MIC*    CEFTAZIDIME 4 SENSITIVE Sensitive     CIPROFLOXACIN <=0.25 SENSITIVE Sensitive     GENTAMICIN <=1 SENSITIVE Sensitive     IMIPENEM 2 SENSITIVE Sensitive     PIP/TAZO 8 SENSITIVE Sensitive     CEFEPIME <=1 SENSITIVE Sensitive     * >=100,000 COLONIES/mL PSEUDOMONAS AERUGINOSA  Blood Culture (routine x 2)     Status: None (Preliminary result)   Collection Time: 10/15/16  4:01 AM  Result Value Ref Range Status   Specimen Description BLOOD RIGHT HAND  Final   Special Requests BOTTLES DRAWN AEROBIC AND ANAEROBIC 6CC EACH  Final   Culture NO GROWTH 2 DAYS  Final   Report Status PENDING  Incomplete  Blood  Culture (routine x 2)     Status: None (Preliminary result)   Collection Time: 10/15/16  4:18 AM  Result Value Ref Range Status   Specimen Description BLOOD LEFT ANTECUBITAL  Final   Special Requests BOTTLES DRAWN AEROBIC AND ANAEROBIC 8CC EACH  Final   Culture NO GROWTH 2 DAYS  Final   Report Status PENDING  Incomplete  Culture, Urine     Status: Abnormal   Collection Time: 10/16/16  4:45 PM  Result Value Ref Range Status   Specimen Description URINE, CLEAN CATCH  Final   Special Requests NONE  Final   Culture >=100,000 COLONIES/mL PSEUDOMONAS AERUGINOSA (A)  Final   Report Status 10/18/2016 FINAL  Final   Organism ID, Bacteria PSEUDOMONAS AERUGINOSA (A)  Final      Susceptibility   Pseudomonas aeruginosa - MIC*    CEFTAZIDIME 2 SENSITIVE Sensitive     CIPROFLOXACIN <=0.25 SENSITIVE Sensitive     GENTAMICIN <=1 SENSITIVE Sensitive     IMIPENEM 2 SENSITIVE Sensitive     PIP/TAZO 8 SENSITIVE Sensitive     CEFEPIME <=1 SENSITIVE Sensitive     * >=100,000 COLONIES/mL PSEUDOMONAS AERUGINOSA  MRSA PCR Screening     Status: Abnormal   Collection Time: 10/17/16 12:53 AM  Result Value Ref Range Status   MRSA by PCR POSITIVE (A) NEGATIVE Final    Comment:        The GeneXpert MRSA Assay (FDA approved for NASAL specimens only), is one component of a comprehensive MRSA colonization surveillance program. It is not intended to diagnose MRSA infection nor to guide or monitor treatment for MRSA infections. RESULT CALLED TO, READ BACK BY AND VERIFIED WITH: THOMAS K AT 0301 ON [id number] BY FORSYTH K    Studies/Results: Ct Chest Wo Contrast  Result Date: 10/16/2016 CLINICAL DATA:  HCAP -seen on CT abdomen/pelvis done 10/15/16. Hx of recent left leg amputation,loculated pleural effusion 09/09/16. Hx of rheumatoid arthritis,HTN,CAD,DM/bbj EXAM: CT CHEST WITHOUT CONTRAST TECHNIQUE: Multidetector CT imaging of the chest was performed following the standard protocol without IV contrast.  COMPARISON:  CT of the abdomen and pelvis 10/15/2016 FINDINGS: Cardiovascular: Heart is mildly enlarged. There is extensive  coronary artery calcification. Calcification at the root of the aorta. Normal noncontrast appearance of the pulmonary arteries. Mediastinum/Nodes: Status post median sternotomy. The visualized portion of the thyroid gland has a normal appearance. Esophagus is normal in appearance. Lungs/Pleura: Small right pleural effusion is present. There is consolidation other right lower lobe. Small nodular opacities are identified within the lung bases, right middle lobe, and lingula are favored to be infectious or inflammatory. Airways are patent. Upper Abdomen: Numerous layering calcified gallstones are present. There is extensive atherosclerotic calcification the branch vessels in the upper abdomen. Calcifications in the kidneys may be vascular calcifications or calculi. Musculoskeletal: No chest wall mass or suspicious bone lesions identified. Chronic changes are seen in both shoulders consistent with rotator cuff injury and glenohumeral osteoarthritis. IMPRESSION: 1. Right pleural effusion and right lower lobe consolidation. 2. Small nodular opacities within the lungs are likely infectious or inflammatory. 3. Coronary artery disease. 4. Status post median sternotomy. 5. Cholelithiasis. 6. Renal arterial calcifications versus intrarenal stones. Electronically Signed   By: Nolon Nations M.D.   On: 10/16/2016 11:29    Medications:  Prior to Admission:  Prescriptions Prior to Admission  Medication Sig Dispense Refill Last Dose  . acetaminophen (TYLENOL) 325 MG tablet Take 650 mg by mouth every 6 (six) hours as needed for mild pain or moderate pain.    unknown  . ALPRAZolam (XANAX) 0.25 MG tablet Take 1 tablet (0.25 mg total) by mouth 3 (three) times daily as needed for anxiety. 30 tablet 0 10/14/2016 at Unknown time  . Amino Acids-Protein Hydrolys (PRO-STAT 64 PO) Take 30 mLs by mouth 2 (two)  times daily.   10/14/2016 at Unknown time  . Artificial Tear Ointment (REFRESH P.M. OP) Apply 1 drop to eye daily as needed (FOR DY EYES). ONE APPLICATION BOTH EYES. For dry eyes not relieved by theratears every 6 hours    unknown  . aspirin 81 MG chewable tablet Chew 81 mg by mouth every morning.    10/14/2016 at Unknown time  . atorvastatin (LIPITOR) 80 MG tablet Take 80 mg by mouth daily.   10/14/2016 at Unknown time  . Balsam [place]-Castor Oil (VENELEX) OINT Apply 1 application topically 3 (three) times daily. Apply to sacrum every shift day, evening, Night and as needed.   10/14/2016 at Unknown time  . Carboxymethylcellulose Sodium (THERATEARS) 0.25 % SOLN Apply 2 drops to both eyes three times a day   10/14/2016 at Unknown time  . collagenase (SANTYL) ointment Apply 1 application topically daily. Apply to residual surgical site per tx orders   10/14/2016 at Unknown time  . docusate sodium (COLACE) 100 MG capsule Take 100 mg by mouth 2 (two) times daily.   10/14/2016 at Unknown time  . hydroxychloroquine (PLAQUENIL) 200 MG tablet Take 200 mg by mouth daily.    10/14/2016 at Unknown time  . insulin NPH Human (HUMULIN N) 100 UNIT/ML injection Give 5 units subcutaneous twice a day   10/14/2016 at Unknown time  . iron polysaccharides (NIFEREX) 150 MG capsule Take 150 mg by mouth daily.   10/14/2016 at Unknown time  . levalbuterol (XOPENEX) 0.63 MG/3ML nebulizer solution Take 3 mLs (0.63 mg total) by nebulization every 4 (four) hours as needed for wheezing or shortness of breath.   unknown  . Melatonin 3 MG TABS Take 2 tablets by mouth at bedtime   10/14/2016 at Unknown time  . metoprolol succinate (TOPROL-XL) 25 MG 24 hr tablet Take 1.5 tablets (37.5 mg total) by mouth every morning.  10/14/2016 at 0900  . Multiple Vitamins-Minerals (THERA-M ENHANCED PO) 1 tablet by mouth daily   10/14/2016 at Unknown time  . nystatin cream (MYCOSTATIN) Apply 1 application topically daily. Apply to bottom rash QD     10/14/2016 at Unknown time  . olopatadine (PATANOL) 0.1 % ophthalmic solution Place 1 drop into both eyes 2 (two) times daily.   10/14/2016 at Unknown time  . omega-3 acid ethyl esters (LOVAZA) 1 g capsule Take 2 g by mouth 2 (two) times daily.   10/14/2016 at Unknown time  . polyethylene glycol powder (MIRALAX) powder Take 17 g by mouth 2 (two) times daily.   10/14/2016 at Unknown time  . predniSONE (DELTASONE) 2.5 MG tablet Take 3 tablets (7.5 mg total) by mouth daily with breakfast. 30 tablet 1 10/14/2016 at Unknown time  . pregabalin (LYRICA) 25 MG capsule Take one capsule by mouth at bedtime 30 capsule 0 10/14/2016 at Unknown time  . Tamsulosin HCl (FLOMAX) 0.4 MG CAPS Take 0.4 mg by mouth at bedtime.    10/14/2016 at Unknown time  . thiamine (VITAMIN B-1) 100 MG tablet Take 100 mg by mouth daily.   10/14/2016 at Unknown time  . torsemide (DEMADEX) 10 MG tablet Give 20 mg by mouth once a day every other day   unknown  . traZODone (DESYREL) 50 MG tablet Take 50 mg by mouth at bedtime.   10/14/2016 at Unknown time  . vitamin C (ASCORBIC ACID) 500 MG tablet Take 500 mg by mouth daily.   10/14/2016 at Unknown time  . zinc sulfate 220 (50 Zn) MG capsule Take 220 mg by mouth daily.   10/14/2016 at Unknown time  . OXYGEN Inhale 2 L into the lungs continuous. O 2 at 2 LPM via n/c to keep O2 sat above 90 % every shift    Taking   Scheduled: . aspirin  81 mg Oral q morning - 10a  . atorvastatin  80 mg Oral Daily  . Chlorhexidine Gluconate Cloth  6 each Topical Q0600  . collagenase   Topical Daily  . docusate sodium  100 mg Oral BID  . enoxaparin (LOVENOX) injection  40 mg Subcutaneous Q24H  . feeding supplement (PRO-STAT SUGAR FREE 64)  30 mL Oral BID  . hydroxychloroquine  200 mg Oral Daily  . insulin aspart  0-9 Units Subcutaneous TID WC  . metoprolol succinate  37.5 mg Oral q morning - 10a  . mupirocin ointment  1 application Nasal BID  . olopatadine  1 drop Both Eyes BID  . omega-3 acid  ethyl esters  2 g Oral BID  . piperacillin-tazobactam (ZOSYN)  IV  3.375 g Intravenous Q8H  . polyethylene glycol  17 g Oral BID  . predniSONE  7.5 mg Oral Q breakfast  . pregabalin  25 mg Oral Daily  . sodium chloride  500 mL Intravenous Once  . tamsulosin  0.4 mg Oral QHS  . thiamine  100 mg Oral Daily  . [START ON 10/19/2016] vancomycin  1,000 mg Intravenous Q36H  . vitamin C  500 mg Oral Daily  . zinc sulfate  220 mg Oral Daily   Continuous: . sodium chloride 0.9 % 1,000 mL infusion 10 mL/hr at 10/15/16 0745   KGY:JEHUDJSHFWYOV, ALPRAZolam, oxyCODONE, zolpidem  Assesment:He was admitted with healthcare associated pneumonia and with urinary tract infection. He is improving. There was concern that he might have empyema but that is less of an issue now. Active Problems:   Insulin dependent diabetes mellitus ([place])  HCAP (healthcare-associated pneumonia)   Urinary tract infection without hematuria   Sepsis ([place])    Plan: I will follow more peripherally at this point. He lives at a skilled care facility and could receive continued IV antibiotics there if needed    LOS: 3 days   Hildegard Hlavac L 10/18/2016, 8:50 AM

## 2016-10-19 DIAGNOSIS — Z794 Long term (current) use of insulin: Secondary | ICD-10-CM

## 2016-10-19 DIAGNOSIS — E119 Type 2 diabetes mellitus without complications: Secondary | ICD-10-CM

## 2016-10-19 DIAGNOSIS — J189 Pneumonia, unspecified organism: Secondary | ICD-10-CM

## 2016-10-19 LAB — CBC
HEMATOCRIT: 29.4 % — AB (ref 39.0–52.0)
HEMOGLOBIN: 9.3 g/dL — AB (ref 13.0–17.0)
MCH: 29.5 pg (ref 26.0–34.0)
MCHC: 31.6 g/dL (ref 30.0–36.0)
MCV: 93.3 fL (ref 78.0–100.0)
Platelets: 132 10*3/uL — ABNORMAL LOW (ref 150–400)
RBC: 3.15 MIL/uL — ABNORMAL LOW (ref 4.22–5.81)
RDW: 17.4 % — AB (ref 11.5–15.5)
WBC: 10.1 10*3/uL (ref 4.0–10.5)

## 2016-10-19 LAB — GLUCOSE, CAPILLARY
Glucose-Capillary: 132 mg/dL — ABNORMAL HIGH (ref 65–99)
Glucose-Capillary: 155 mg/dL — ABNORMAL HIGH (ref 65–99)
Glucose-Capillary: 206 mg/dL — ABNORMAL HIGH (ref 65–99)

## 2016-10-19 LAB — BASIC METABOLIC PANEL
ANION GAP: 4 — AB (ref 5–15)
BUN: 55 mg/dL — AB (ref 6–20)
CO2: 24 mmol/L (ref 22–32)
Calcium: 8.3 mg/dL — ABNORMAL LOW (ref 8.9–10.3)
Chloride: 110 mmol/L (ref 101–111)
Creatinine, Ser: 1.55 mg/dL — ABNORMAL HIGH (ref 0.61–1.24)
GFR calc Af Amer: 52 mL/min — ABNORMAL LOW (ref >60)
GFR calc non Af Amer: 45 mL/min — ABNORMAL LOW (ref >60)
GLUCOSE: 104 mg/dL — AB (ref 65–99)
POTASSIUM: 4 mmol/L (ref 3.5–5.1)
Sodium: 138 mmol/L (ref 135–145)

## 2016-10-19 NOTE — Progress Notes (Signed)
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 He had an indwelling catheter on arrival, which he stated made him feel a sense of frequent urination. He was found to have sepsis related to possible UTI and HCAP. He has been started on IV antibiotics.  Dr Luan Pulling has recommended 10 days of IV antibiotics, and patient's SNF has refused to accept patient's back.   Assessment & Plan:   Active Problems:   Insulin dependent diabetes mellitus ([place])   HCAP (healthcare-associated pneumonia)   Urinary tract infection without hematuria   Sepsis ([place])  1. Sepsis. Patient has been bolused IV fluids, per sepsis protocol. Blood pressures have improved. Blood cultures have shown no growth. We'll continue IV antibiotics Zosyn and Vancomycin Day 4/10.  2. UTI without hematuria. Patient was started on IV antibiotics. Patient had an indwelling foley catheter placed on arrival.Urine culture was  positive for Pseudomonas. Will follow-up sensitivities.  3. HCAP. CT abdomen had shown evidence of RLL pneumonia and effusion.Patient had dedicated CT chest that showed persistent right lower lobe consolidation and associated effusion. Pulmonology following and feels that it is less likely to be an empyema. Recommendations are to continue current antibiotics, vancomycin and Zosyn. 4. Chronic combined CHFwith EF 30-35%. Volume status appears to be stable. Demadex currently on hold due to sepsis. Continue to follow. 5. CKD stage 3. Creatinine is at baseline. Continue to monitor input and output. 6. Normocytic anemia. No reported evidence of bleeding. Possibly related to dilution as well as chronic disease. Stool negative for occult blood. Anemia panel  reviewed and likely a combination of iron deficiency and chronic disease. He was transfused 2 units of packed red blood cells on 10/22 with symptomatic improvement. 7. Rheumatoid arthritis. He is steroid dependent. Continue current outpatient regimen of prednisone and plaquenil. Will monitor closely. 8. IDDM. Blood sugars are currently stable with sliding scale insulin. Continue to monitor 9. HTN. Pressures are stable. Continue Toprol. 10. HLD. Continue statin. 11. BPH with indwelling foley catheter. Continue Flomax. 12. Constipation. Patient has had multiple bowel movements. Continue Miramax. 13. Left BKA wound. Per [facility] nurse, will continue outpatient care regimen and implement enzymatic debridement using Santyl. A pressure redistribution heel boot has been provided for the right heel and a prophylactic dressing has been ordered for the coccyx. Patient has been advised to turn and reposition to relieve pressure to the coccyx.  DVT prophylaxis: Lovenox  Code Status: Full  Family Communication: Discussed with wife at bedside  Disposition Plan: Discharge to SNF when improved.    Consultants:   Pulmonary.   Procedures:   None     Antimicrobials:  IV Van/Zosyn.    Subjective:   Doing OK.    Objective: Vitals:   10/18/16 1423 10/18/16 2044 10/18/16 2120 10/19/16 0645  BP: (!) 133/46  118/69 121/64  Pulse: 73  75 70  Resp: 18  20 16   Temp: 97.6 F (36.4 C)  97 F (36.1 C) 98 F (36.7 C)  TempSrc: Oral  Oral Oral  SpO2: 100% 98% 100% 98%  Weight:        Intake/Output Summary (Last 24 hours) at 10/19/16 1612 Last data filed at 10/19/16 1530  Gross per 24 hour  Intake  779 ml  Output             1350 ml  Net             -571 ml   Filed Weights   10/15/16 0700  Weight: 82 kg (180 lb 12.4 oz)    Examination:  General exam: Appears calm and comfortable  Respiratory system: Clear to auscultation. Respiratory effort normal. Cardiovascular system: S1 & S2  heard, RRR. No JVD, murmurs, rubs, gallops or clicks. No pedal edema. Gastrointestinal system: Abdomen is nondistended, soft and nontender. No organomegaly or masses felt. Normal bowel sounds heard. Central nervous system: Alert and oriented. No focal neurological deficits. Extremities: Symmetric 5 x 5 power. Skin: No rashes, lesions or ulcers Psychiatry: Judgement and insight appear normal. Mood & affect appropriate.   Recent Labs Lab 10/15/16 0219 10/16/16 0524 10/17/16 0610 10/18/16 0609 10/19/16 0624  WBC 18.5* 23.7* 20.3* 13.5* 10.1  NEUTROABS 13.1*  --   --   --   --   HGB 9.1* 7.1* 9.5* 9.1* 9.3*  HCT 28.9* 22.1* 30.1* 28.6* 29.4*  MCV 96.0 97.8 92.9 92.0 93.3  PLT 199 138* 142* 139* 720*   Basic Metabolic Panel:  Recent Labs Lab 10/15/16 0219 10/16/16 0524 10/17/16 0610 10/18/16 0609 10/19/16 0624  NA 137 138 135 137 138  K 4.6 4.3 4.1 3.8 4.0  CL 101 109 107 109 110  CO2 27 23 20* 23 24  GLUCOSE 182* 97 145* 123* 104*  BUN 83* 80* 80* 67* 55*  CREATININE 2.08* 1.88* 1.72* 1.62* 1.55*  CALCIUM 8.9 8.3* 8.4* 8.3* 8.3*   GFR: Estimated Creatinine Clearance: 46.2 mL/min (by C-G formula based on SCr of 1.55 mg/dL (H)). Liver Function Tests:  Recent Labs Lab 10/15/16 0219  AST 38  ALT 40  ALKPHOS 390*  BILITOT 0.7  PROT 6.8  ALBUMIN 2.7*   CBG:  Recent Labs Lab 10/18/16 1127 10/18/16 1625 10/18/16 2117 10/19/16 0803 10/19/16 1210  GLUCAP 184* 191* 168* 132* 155*    Recent Labs Lab 10/15/16 0234 10/15/16 0605  LATICACIDVEN 2.32* 1.19    Recent Results (from the past 240 hour(s))  Urine culture     Status: Abnormal   Collection Time: 10/15/16  3:56 AM  Result Value Ref Range Status   Specimen Description URINE, CLEAN CATCH  Final   Special Requests NONE  Final   Culture >=100,000 COLONIES/mL PSEUDOMONAS AERUGINOSA (A)  Final   Report Status 10/18/2016 FINAL  Final   Organism ID, Bacteria PSEUDOMONAS AERUGINOSA (A)  Final       Susceptibility   Pseudomonas aeruginosa - MIC*    CEFTAZIDIME 4 SENSITIVE Sensitive     CIPROFLOXACIN <=0.25 SENSITIVE Sensitive     GENTAMICIN <=1 SENSITIVE Sensitive     IMIPENEM 2 SENSITIVE Sensitive     PIP/TAZO 8 SENSITIVE Sensitive     CEFEPIME <=1 SENSITIVE Sensitive     * >=100,000 COLONIES/mL PSEUDOMONAS AERUGINOSA  Blood Culture (routine x 2)     Status: None (Preliminary result)   Collection Time: 10/15/16  4:01 AM  Result Value Ref Range Status   Specimen Description BLOOD RIGHT HAND  Final   Special Requests BOTTLES DRAWN AEROBIC AND ANAEROBIC 6CC EACH  Final   Culture NO GROWTH 4 DAYS  Final   Report Status PENDING  Incomplete  Blood Culture (routine x 2)     Status: None (Preliminary result)   Collection Time: 10/15/16  4:18 AM  Result Value Ref Range Status  Specimen Description BLOOD LEFT ANTECUBITAL  Final   Special Requests BOTTLES DRAWN AEROBIC AND ANAEROBIC 8CC EACH  Final   Culture NO GROWTH 4 DAYS  Final   Report Status PENDING  Incomplete  Culture, Urine     Status: Abnormal   Collection Time: 10/16/16  4:45 PM  Result Value Ref Range Status   Specimen Description URINE, CLEAN CATCH  Final   Special Requests NONE  Final   Culture >=100,000 COLONIES/mL PSEUDOMONAS AERUGINOSA (A)  Final   Report Status 10/18/2016 FINAL  Final   Organism ID, Bacteria PSEUDOMONAS AERUGINOSA (A)  Final      Susceptibility   Pseudomonas aeruginosa - MIC*    CEFTAZIDIME 2 SENSITIVE Sensitive     CIPROFLOXACIN <=0.25 SENSITIVE Sensitive     GENTAMICIN <=1 SENSITIVE Sensitive     IMIPENEM 2 SENSITIVE Sensitive     PIP/TAZO 8 SENSITIVE Sensitive     CEFEPIME <=1 SENSITIVE Sensitive     * >=100,000 COLONIES/mL PSEUDOMONAS AERUGINOSA  MRSA PCR Screening     Status: Abnormal   Collection Time: 10/17/16 12:53 AM  Result Value Ref Range Status   MRSA by PCR POSITIVE (A) NEGATIVE Final    Comment:        The GeneXpert MRSA Assay (FDA approved for NASAL specimens only), is one  component of a comprehensive MRSA colonization surveillance program. It is not intended to diagnose MRSA infection nor to guide or monitor treatment for MRSA infections. RESULT CALLED TO, READ BACK BY AND VERIFIED WITH: THOMAS K AT 0301 ON 102317 BY FORSYTH K      Scheduled Meds: . aspirin  81 mg Oral q morning - 10a  . atorvastatin  80 mg Oral Daily  . Chlorhexidine Gluconate Cloth  6 each Topical Q0600  . collagenase   Topical Daily  . docusate sodium  100 mg Oral BID  . enoxaparin (LOVENOX) injection  40 mg Subcutaneous Q24H  . feeding supplement (PRO-STAT SUGAR FREE 64)  30 mL Oral BID  . hydroxychloroquine  200 mg Oral Daily  . insulin aspart  0-9 Units Subcutaneous TID WC  . metoprolol succinate  37.5 mg Oral q morning - 10a  . mupirocin ointment  1 application Nasal BID  . olopatadine  1 drop Both Eyes BID  . omega-3 acid ethyl esters  2 g Oral BID  . piperacillin-tazobactam (ZOSYN)  IV  3.375 g Intravenous Q8H  . polyethylene glycol  17 g Oral BID  . predniSONE  7.5 mg Oral Q breakfast  . pregabalin  25 mg Oral Daily  . sodium chloride  500 mL Intravenous Once  . tamsulosin  0.4 mg Oral QHS  . thiamine  100 mg Oral Daily  . vancomycin  1,000 mg Intravenous Q36H  . vitamin C  500 mg Oral Daily  . zinc sulfate  220 mg Oral Daily   Continuous Infusions: . sodium chloride 0.9 % 1,000 mL infusion 10 mL/hr at 10/15/16 0745     LOS: 4 days  Time spent: 62   Kieley Akter, MD FACP. [facility]   If 7PM-7AM, please contact night-coverage [email or web address] Password [email or web address] 10/19/2016, 4:12 PM

## 2016-10-19 NOTE — Progress Notes (Signed)
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 He had an indwelling catheter on arrival, which he stated made him feel a sense of frequent urination. He was found to have sepsis related to possible UTI and HCAP and he has been started on IV antibiotics.  He has been asking for symptomatic Tx of his pain and nausea.  He had been on Methadone 10mg  QID previously.  His QTc is 420 ms. He will require IV antibiotics for 10 days. He is thinking about changing his GOC to palliative care.  Currently, he is a full code.   Assessment & Plan:   Active Problems:   Insulin dependent diabetes mellitus ([place])   HCAP (healthcare-associated pneumonia)   Urinary tract infection without hematuria   Sepsis ([place])  1. Vomiting. Patient vomited today. Will Increase his ativan and give him Zofran.  2. Sepsis -  Patient has a fever of 100.3 today. Blood cultures have shown no growth since admission.  Will continue empiric antibiotics Zosyn and Vancomycin, day 5/10.  3. UTI- Patient had an indwelling foley catheter placed in the emergency department. Urine culture was positive for pseudomonas and she was started on vancomycin and Zosyn, will continue.  4. HCAP. Abdomen CT shows right pleural effusion. Chest CT also revealed RLL pneumonia. Continue above antibiotics.  5. Chronic combined CHF with an ejection fraction 30-35%. Continue to hold Demadex in the setting of sepsis. Continue to follow.   6. CKD stage 3. Creatinine is at baseline. Continue to monitor intake and output. 7. Normocytic anemia. No evidence of bleeding.  Anemia panel has been reviewed and she likely has a combination of iron deficiency and anemia of chronic disease.  8. Rheumatoid  arthritis - Continue prednisone and plaquenl . Monitor closely.  9. IDDM. Stable. Continue SSI.  10. HTN. Controlled. Continue Toprol.  11. HLD. Continue statin  12. BPH with indwelling foley catheter. Continue Flomax.  13. Constipation. Patient is now having multiple bowel movements. Continue Miramax.  14. Left BKA wound. Per [facility], will continue outpatient care regimen and implement enzymatic debridement using Santyl. A pressure redistribution heel boot has been provided for the right heel and a prophylactic dressing has been ordered for the coccyx. Patient has been advised to turn and reposition to relieve pressure to the coccyx.   DVT prophylaxis: Lovenox  Code Status: Full  Family Communication: Wife bedside Disposition Plan: Discharge home once improved.    Consultants:   Pulmonology   Wound care   Procedures:   Transfused 2 units of packed red blood cells on 10/22   Antimicrobials:   Vancomycin 10/21 >>  Zosyn 10/22 >>    Subjective: Vomiting today. Temperature was 100.3. Does not feel well.   Objective: Vitals:   10/18/16 1053 10/18/16 1423 10/18/16 2044 10/18/16 2120  BP: (!) 148/62 (!) 133/46  118/69  Pulse: 79 73  75  Resp:  18  20  Temp:  97.6 F (36.4 C)  97 F (36.1 C)  TempSrc:  Oral  Oral  SpO2:  100% 98% 100%  Weight:        Intake/Output Summary (Last 24 hours) at 10/19/16 0736 Last data filed at 10/19/16 0600  Gross per 24 hour  Intake  1414 ml  Output              850 ml  Net              564 ml   Filed Weights   10/15/16 0700  Weight: 82 kg (180 lb 12.4 oz)    Examination:  General exam: Appears calm and comfortable  Respiratory system: Clear to auscultation. Respiratory effort normal. Cardiovascular system: S1 & S2 heard, RRR. No JVD, murmurs, rubs, gallops or clicks. No pedal edema. Gastrointestinal system: Abdomen is nondistended, soft and nontender. No organomegaly or masses felt. Normal bowel sounds heard. Central  nervous system: Alert and oriented. No focal neurological deficits. Extremities: Symmetric 5 x 5 power. Skin: No rashes, lesions or ulcers Psychiatry: Judgement and insight appear normal. Mood & affect appropriate.     Data Reviewed: I have personally reviewed following labs and imaging studies  Recent Labs Lab 10/15/16 0219 10/16/16 0524 10/17/16 0610 10/18/16 0609 10/19/16 0624  WBC 18.5* 23.7* 20.3* 13.5* 10.1  NEUTROABS 13.1*  --   --   --   --   HGB 9.1* 7.1* 9.5* 9.1* 9.3*  HCT 28.9* 22.1* 30.1* 28.6* 29.4*  MCV 96.0 97.8 92.9 92.0 93.3  PLT 199 138* 142* 139* 132*    Recent Labs Lab 10/15/16 0219 10/16/16 0524 10/17/16 0610 10/18/16 0609 10/19/16 0624  NA 137 138 135 137 138  K 4.6 4.3 4.1 3.8 4.0  CL 101 109 107 109 110  CO2 27 23 20* 23 24  GLUCOSE 182* 97 145* 123* 104*  BUN 83* 80* 80* 67* 55*  CREATININE 2.08* 1.88* 1.72* 1.62* 1.55*  CALCIUM 8.9 8.3* 8.4* 8.3* 8.3*    Recent Labs Lab 10/15/16 0219  AST 38  ALT 40  ALKPHOS 390*  BILITOT 0.7  PROT 6.8  ALBUMIN 2.7*    Recent Labs Lab 10/17/16 2119 10/18/16 0745 10/18/16 1127 10/18/16 1625 10/18/16 2117  GLUCAP 207* 117* 184* 191* 168*    Recent Labs  10/16/16 1353  VITAMINB12 474  FOLATE 29.8  FERRITIN 559*  TIBC 108*  IRON 12*  RETICCTPCT 1.7    Recent Labs Lab 10/15/16 0234 10/15/16 0605  LATICACIDVEN 2.32* 1.19    Recent Results (from the past 240 hour(s))  Urine culture     Status: Abnormal   Collection Time: 10/15/16  3:56 AM  Result Value Ref Range Status   Specimen Description URINE, CLEAN CATCH  Final   Special Requests NONE  Final   Culture >=100,000 COLONIES/mL PSEUDOMONAS AERUGINOSA (A)  Final   Report Status 10/18/2016 FINAL  Final   Organism ID, Bacteria PSEUDOMONAS AERUGINOSA (A)  Final      Susceptibility   Pseudomonas aeruginosa - MIC*    CEFTAZIDIME 4 SENSITIVE Sensitive     CIPROFLOXACIN <=0.25 SENSITIVE Sensitive     GENTAMICIN <=1 SENSITIVE  Sensitive     IMIPENEM 2 SENSITIVE Sensitive     PIP/TAZO 8 SENSITIVE Sensitive     CEFEPIME <=1 SENSITIVE Sensitive     * >=100,000 COLONIES/mL PSEUDOMONAS AERUGINOSA  Blood Culture (routine x 2)     Status: None (Preliminary result)   Collection Time: 10/15/16  4:01 AM  Result Value Ref Range Status   Specimen Description BLOOD RIGHT HAND  Final   Special Requests BOTTLES DRAWN AEROBIC AND ANAEROBIC 6CC EACH  Final   Culture NO GROWTH 3 DAYS  Final   Report Status PENDING  Incomplete  Blood Culture (routine x 2)  Status: None (Preliminary result)   Collection Time: 10/15/16  4:18 AM  Result Value Ref Range Status   Specimen Description BLOOD LEFT ANTECUBITAL  Final   Special Requests BOTTLES DRAWN AEROBIC AND ANAEROBIC 8CC EACH  Final   Culture NO GROWTH 3 DAYS  Final   Report Status PENDING  Incomplete  Culture, Urine     Status: Abnormal   Collection Time: 10/16/16  4:45 PM  Result Value Ref Range Status   Specimen Description URINE, CLEAN CATCH  Final   Special Requests NONE  Final   Culture >=100,000 COLONIES/mL PSEUDOMONAS AERUGINOSA (A)  Final   Report Status 10/18/2016 FINAL  Final   Organism ID, Bacteria PSEUDOMONAS AERUGINOSA (A)  Final      Susceptibility   Pseudomonas aeruginosa - MIC*    CEFTAZIDIME 2 SENSITIVE Sensitive     CIPROFLOXACIN <=0.25 SENSITIVE Sensitive     GENTAMICIN <=1 SENSITIVE Sensitive     IMIPENEM 2 SENSITIVE Sensitive     PIP/TAZO 8 SENSITIVE Sensitive     CEFEPIME <=1 SENSITIVE Sensitive     * >=100,000 COLONIES/mL PSEUDOMONAS AERUGINOSA  MRSA PCR Screening     Status: Abnormal   Collection Time: 10/17/16 12:53 AM  Result Value Ref Range Status   MRSA by PCR POSITIVE (A) NEGATIVE Final    Comment:        The GeneXpert MRSA Assay (FDA approved for NASAL specimens only), is one component of a comprehensive MRSA colonization surveillance program. It is not intended to diagnose MRSA infection nor to guide or monitor treatment for MRSA  infections. RESULT CALLED TO, READ BACK BY AND VERIFIED WITH: THOMAS K AT 0301 ON 102317 BY FORSYTH K       Scheduled Meds: . aspirin  81 mg Oral q morning - 10a  . atorvastatin  80 mg Oral Daily  . Chlorhexidine Gluconate Cloth  6 each Topical Q0600  . collagenase   Topical Daily  . docusate sodium  100 mg Oral BID  . enoxaparin (LOVENOX) injection  40 mg Subcutaneous Q24H  . feeding supplement (PRO-STAT SUGAR FREE 64)  30 mL Oral BID  . hydroxychloroquine  200 mg Oral Daily  . insulin aspart  0-9 Units Subcutaneous TID WC  . metoprolol succinate  37.5 mg Oral q morning - 10a  . mupirocin ointment  1 application Nasal BID  . olopatadine  1 drop Both Eyes BID  . omega-3 acid ethyl esters  2 g Oral BID  . piperacillin-tazobactam (ZOSYN)  IV  3.375 g Intravenous Q8H  . polyethylene glycol  17 g Oral BID  . predniSONE  7.5 mg Oral Q breakfast  . pregabalin  25 mg Oral Daily  . sodium chloride  500 mL Intravenous Once  . tamsulosin  0.4 mg Oral QHS  . thiamine  100 mg Oral Daily  . vancomycin  1,000 mg Intravenous Q36H  . vitamin C  500 mg Oral Daily  . zinc sulfate  220 mg Oral Daily   Continuous Infusions: . sodium chloride 0.9 % 1,000 mL infusion 10 mL/hr at 10/15/16 0745     LOS: 4 days   Time spent: 25 minutes   Orvan Falconer, MD FACP [facility] If 7PM-7AM, please contact night-coverage [email or web address] Password [email or web address] 10/19/2016, 7:36 AM   By signing my name below, I, Collene Leyden, attest that this documentation has been prepared under the direction and in the presence of Orvan Falconer, MD. Electronically signed: Collene Leyden, Scribe. 10/20/16 12:06 PM

## 2016-10-19 NOTE — NC FL2 (Signed)
[place] MEDICAID FL2 LEVEL OF CARE SCREENING TOOL     IDENTIFICATION  Patient Name: [name] Birthdate: 1951-02-11 Sex: male Admission Date (Current Location): 10/15/2016  [facility] and [place] Number:  [employer] and Address:  [place] [address], [place]      Provider Number: [id number]  Attending Physician Name and Address:  Orvan Falconer, MD  Relative Name and Phone Number:       Current Level of Care: Hospital Recommended Level of Care: [facility] Prior Approval Number:    Date Approved/Denied:   PASRR Number: [id number]  Discharge Plan: SNF    Current Diagnoses: Patient Active Problem List   Diagnosis Date Noted  . Urinary tract infection without hematuria 10/15/2016  . Sepsis ([place]) 10/15/2016  . Loculated pleural effusion 09/09/2016  . Shortness of breath 09/08/2016  . Acute on chronic combined systolic and diastolic CHF (congestive heart failure) ([place]) 09/02/2016  . Acute renal failure superimposed on stage 3 chronic kidney disease ([place]) 09/02/2016  . HCAP (healthcare-associated pneumonia) 09/02/2016  . Hyperkalemia 09/02/2016  . Sacral decubitus ulcer 09/02/2016  . Pressure ulcer 08/27/2016  . ESRD needing dialysis ([place]) 08/26/2016  . Acute hematogenous osteomyelitis of left foot ([place]) 08/09/2016  . Cellulitis and abscess of lower extremity 05/29/2016  . Anemia of chronic disease 04/27/2016  . Status post amputation of toe of left foot ([place]) 04/27/2016  . Systolic heart failure ([place]) 07/09/2015  . Bradycardia 08/14/2014  . Prolonged QT interval 08/14/2014  . Gait disorder 08/14/2014  . Seizure ([place]) 08/13/2014  . Pancytopenia ([place]) 08/13/2014  . ARF (acute renal failure) ([place]) 08/13/2014  . Altered mental status 06/18/2014  . Closed dislocation of metatarsophalangeal (joint) 03/26/2013  . Insulin dependent diabetes mellitus ([place]) 12/11/2012  . Osteomyelitis of metatarsal ([place]) 12/11/2012  .  Hyperlipidemia 12/11/2012  . Coronary artery disease   . Rheumatoid arthritis ([place])   . Hypertension     Orientation RESPIRATION BLADDER Height & Weight     Self, Time, Situation, Place  O2 (2 L) Indwelling catheter Weight: 180 lb 12.4 oz (82 kg) Height:     BEHAVIORAL SYMPTOMS/MOOD NEUROLOGICAL BOWEL NUTRITION STATUS  Other (Comment) (none) Convulsions/Seizures (history) Incontinent Diet (Dysphagia 3 with thin liquids)  AMBULATORY STATUS COMMUNICATION OF NEEDS Skin   Total Care Verbally Bruising, Other (Comment) (Moisture associated skin damage to sacrum. Left BKA wound with santyl. BID dressing changes. See wound care note. Pressure redistribution boot.)                       Personal Care Assistance Level of Assistance  Bathing, Feeding, Dressing Bathing Assistance: Maximum assistance Feeding assistance: Limited assistance Dressing Assistance: Maximum assistance     Functional Limitations Info  Sight, Hearing, Speech Sight Info: Adequate Hearing Info: Adequate Speech Info: Adequate    SPECIAL CARE FACTORS FREQUENCY                       Contractures Contractures Info: Not present    Additional Factors Info  Insulin Sliding Scale, Psychotropic Code Status Info: Full code Allergies Info: Sulfa antibiotics Psychotropic Info: Xanax   Isolation Precautions Info: 10/17/16 MRSA Screen = Positive (cj)     Current Medications (10/19/2016):  This is the current hospital active medication list Current Facility-Administered Medications  Medication Dose Route Frequency Provider Last Rate Last Dose  . acetaminophen (TYLENOL) tablet 650 mg  650 mg Oral Q6H PRN Frederich Chick  Toney Sang, MD   650 mg at 10/16/16 2245  . ALPRAZolam Duanne Moron) tablet 0.25 mg  0.25 mg Oral TID PRN Oswald Hillock, MD   0.25 mg at 10/18/16 2238  . aspirin chewable tablet 81 mg  81 mg Oral q morning - 10a Oswald Hillock, MD   81 mg at 10/19/16 0849  . atorvastatin (LIPITOR) tablet 80 mg  80 mg Oral Daily Oswald Hillock, MD   80 mg at 10/19/16 0846  . Chlorhexidine Gluconate Cloth 2 % PADS 6 each  6 each Topical Q0600 Kathie Dike, MD   6 each at 10/19/16 0505  . collagenase (SANTYL) ointment   Topical Daily Kathie Dike, MD      . docusate sodium (COLACE) capsule 100 mg  100 mg Oral BID Oswald Hillock, MD   100 mg at 10/19/16 0846  . enoxaparin (LOVENOX) injection 40 mg  40 mg Subcutaneous Q24H Oswald Hillock, MD   40 mg at 10/19/16 0849  . feeding supplement (PRO-STAT SUGAR FREE 64) liquid 30 mL  30 mL Oral BID Oswald Hillock, MD   30 mL at 10/19/16 0845  . hydroxychloroquine (PLAQUENIL) tablet 200 mg  200 mg Oral Daily Oswald Hillock, MD   200 mg at 10/19/16 0846  . insulin aspart (novoLOG) injection 0-9 Units  0-9 Units Subcutaneous TID WC Oswald Hillock, MD   1 Units at 10/19/16 0845  . metoprolol succinate (TOPROL-XL) 24 hr tablet 37.5 mg  37.5 mg Oral q morning - 10a Oswald Hillock, MD   37.5 mg at 10/19/16 0848  . mupirocin ointment (BACTROBAN) 2 % 1 application  1 application Nasal BID Kathie Dike, MD   1 application at 67/59/16 0850  . olopatadine (PATANOL) 0.1 % ophthalmic solution 1 drop  1 drop Both Eyes BID Oswald Hillock, MD   1 drop at 10/19/16 0850  . omega-3 acid ethyl esters (LOVAZA) capsule 2 g  2 g Oral BID Oswald Hillock, MD   2 g at 10/18/16 2223  . oxyCODONE (Oxy IR/ROXICODONE) immediate release tablet 10 mg  10 mg Oral Q4H PRN Jeryl Columbia, NP   10 mg at 10/19/16 0850  . piperacillin-tazobactam (ZOSYN) IVPB 3.375 g  3.375 g Intravenous Q8H Kathie Dike, MD   3.375 g at 10/19/16 0846  . polyethylene glycol (MIRALAX / GLYCOLAX) packet 17 g  17 g Oral BID Kathie Dike, MD   17 g at 10/19/16 0845  . predniSONE (DELTASONE) tablet 7.5 mg  7.5 mg Oral Q breakfast Oswald Hillock, MD   7.5 mg at 10/19/16 0848  . pregabalin (LYRICA) capsule 25 mg  25 mg Oral Daily Oswald Hillock, MD   25 mg at 10/19/16 0846  . sodium chloride 0.9 % 1,000 mL infusion   Intravenous Continuous Kathie Dike, MD 10  mL/hr at 10/15/16 0745    . sodium chloride 0.9 % bolus 500 mL  500 mL Intravenous Once Delora Fuel, MD      . tamsulosin [facility]) capsule 0.4 mg  0.4 mg Oral QHS Oswald Hillock, MD   0.4 mg at 10/18/16 2224  . thiamine (VITAMIN B-1) tablet 100 mg  100 mg Oral Daily Oswald Hillock, MD   100 mg at 10/19/16 0847  . traZODone (DESYREL) tablet 50 mg  50 mg Oral QHS PRN Jeryl Columbia, NP   50 mg at 10/18/16 2224  . vancomycin (VANCOCIN) IVPB 1000 mg/200 mL premix  1,000 mg Intravenous Q36H Kathie Dike, MD   1,000 mg at 10/19/16 0453  . vitamin C (ASCORBIC ACID) tablet 500 mg  500 mg Oral Daily Oswald Hillock, MD   500 mg at 10/19/16 0849  . zinc sulfate capsule 220 mg  220 mg Oral Daily Oswald Hillock, MD   220 mg at 10/19/16 0847  . zolpidem (AMBIEN) tablet 5 mg  5 mg Oral QHS PRN Jeryl Columbia, NP   5 mg at 10/16/16 2245     Discharge Medications: Please see discharge summary for a list of discharge medications.  Relevant Imaging Results:  Relevant Lab Results:   Additional Information SSN: [id number]. From another SNF. Peripheral IV for several more days IV antibiotics.   Benay Pike [place], [place]

## 2016-10-19 NOTE — Progress Notes (Signed)
Plans are being made for transfer to skilled care facility which I think is appropriate. He is on IV antibiotics. He was septic on admission. I think since he has been in and out of the hospital with pneumonia and other problems is reasonable to continue his antibiotics IV for a total of 10 days.  I will plan to sign off.  Thanks for allowing me to see him with you

## 2016-10-19 NOTE — Clinical Social Work Note (Signed)
CSW spoke to pt's daughter, Santiago Glad again this morning. Informed her that per admissions at [facility], facility is unable to take pt. Discussed doing a county wide search in [place] and she agrees, but will also be making plans for pt to return home if needed.   Benay Pike, [place]

## 2016-10-20 LAB — CULTURE, BLOOD (ROUTINE X 2)
CULTURE: NO GROWTH
CULTURE: NO GROWTH

## 2016-10-20 LAB — GLUCOSE, CAPILLARY
GLUCOSE-CAPILLARY: 162 mg/dL — AB (ref 65–99)
GLUCOSE-CAPILLARY: 126 mg/dL — AB (ref 65–99)
GLUCOSE-CAPILLARY: 175 mg/dL — AB (ref 65–99)
Glucose-Capillary: 129 mg/dL — ABNORMAL HIGH (ref 65–99)
Glucose-Capillary: 149 mg/dL — ABNORMAL HIGH (ref 65–99)

## 2016-10-20 MED ORDER — ORAL CARE MOUTH RINSE
15.0000 mL | Freq: Two times a day (BID) | OROMUCOSAL | Status: DC
  Administered 2016-10-20 – 2016-10-22 (×5): 15 mL via OROMUCOSAL

## 2016-10-20 MED ORDER — ONDANSETRON HCL 4 MG/2ML IJ SOLN
4.0000 mg | Freq: Four times a day (QID) | INTRAMUSCULAR | Status: DC | PRN
  Administered 2016-10-20: 4 mg via INTRAVENOUS
  Filled 2016-10-20: qty 2

## 2016-10-20 MED ORDER — ALPRAZOLAM 0.5 MG PO TABS
0.5000 mg | ORAL_TABLET | Freq: Three times a day (TID) | ORAL | Status: DC | PRN
  Administered 2016-10-20 – 2016-10-22 (×6): 0.5 mg via ORAL
  Filled 2016-10-20 (×6): qty 1

## 2016-10-20 NOTE — Progress Notes (Signed)
Patient had 2 episodes of vomiting this AM.  MD made aware via text page and verbally, no PRN antiemetics.  No new orders at this time

## 2016-10-20 NOTE — Progress Notes (Signed)
At approximately 0900, patient vomited small amount of liquid. Patient tearful, c/o generalized pain rated 10/10. Patient states that Oxycodone only lasts about 2 hours. Dr. Marin Comment notified. Patient given Oxycodone 10 mg at 0927 per PRN orders. Approximately 5 minutes later, patient vomited small amount again. PO meds for this AM held d/t vomiting. At approximately 1100, patient was resting in bed with eyes closed. No distress noted, no complaints. Wife in room. Dr. Marin Comment recently in room to assess patient and spoke to patient and wife. Megan, Agricultural consultant in patient's room at this time to attempt to give medications. Will continue to monitor.

## 2016-10-21 DIAGNOSIS — N390 Urinary tract infection, site not specified: Secondary | ICD-10-CM

## 2016-10-21 LAB — GLUCOSE, CAPILLARY
GLUCOSE-CAPILLARY: 163 mg/dL — AB (ref 65–99)
GLUCOSE-CAPILLARY: 231 mg/dL — AB (ref 65–99)
Glucose-Capillary: 90 mg/dL (ref 65–99)
Glucose-Capillary: 180 mg/dL — ABNORMAL HIGH (ref 65–99)

## 2016-10-21 LAB — BASIC METABOLIC PANEL
Anion gap: 5 (ref 5–15)
BUN: 43 mg/dL — AB (ref 6–20)
CHLORIDE: 109 mmol/L (ref 101–111)
CO2: 22 mmol/L (ref 22–32)
CREATININE: 1.51 mg/dL — AB (ref 0.61–1.24)
Calcium: 8.2 mg/dL — ABNORMAL LOW (ref 8.9–10.3)
GFR calc non Af Amer: 46 mL/min — ABNORMAL LOW (ref >60)
GFR, EST AFRICAN AMERICAN: 53 mL/min — AB (ref >60)
GLUCOSE: 94 mg/dL (ref 65–99)
Potassium: 3.7 mmol/L (ref 3.5–5.1)
Sodium: 136 mmol/L (ref 135–145)

## 2016-10-21 MED ORDER — SODIUM CHLORIDE 0.9 % IV SOLN
INTRAVENOUS | Status: DC
  Administered 2016-10-21: 12:00:00 via INTRAVENOUS

## 2016-10-21 NOTE — Progress Notes (Signed)
LCSW met with patient and family at the bedside and spoke with his daughter via phone.  Family has decided to take patient home with home health. CM is working on setting patient up for home.  No SW needs at this time.  Bed offers were given to patient and family. Discussed placement from home and qualifying placement.  Explained time line and qualifying stay for placement from home. Daughter verbalized understanding.  [name], MSW Clinical Social Work: [employer] Coverage for :  [phone]

## 2016-10-21 NOTE — Care Management (Signed)
[name] of [facility] notified patient will be discharging with [place] services.

## 2016-10-21 NOTE — Progress Notes (Signed)
Pharmacy Antibiotic Note  [name] is a 65 y.o. male admitted on 10/15/2016 with sepsis/ UTI/Pneumonia.   Pharmacy has been consulted for zosyn and vancomycin dosing. Antibiotic change to zosyn for aspiration PNA along with vancomycin Urine culture growing out Pseudomonas, sens to zosyn.  Vanc dose adjusted earlier due to elevated trough.  Plan: Zosyn 3.375gm IV q8h EID over 4 hours Vancomycin 1000mg  IV q36h. Goal trough 15-54mcg/ml F/U cultures, V/S and monitor clinical progress  Weight: 180 lb 12.4 oz (82 kg)  Temp (24hrs), Avg:99.1 F (37.3 C), Min:98.6 F (37 C), Max:100.3 F (37.9 C)   Recent Labs Lab 10/15/16 0219 10/15/16 0234 10/15/16 0605 10/16/16 0524 10/17/16 0610 10/17/16 2046 10/18/16 0609 10/19/16 0624 10/21/16 0500  WBC 18.5*  --   --  23.7* 20.3*  --  13.5* 10.1  --   CREATININE 2.08*  --   --  1.88* 1.72*  --  1.62* 1.55* 1.51*  LATICACIDVEN  --  2.32* 1.19  --   --   --   --   --   --   VANCOTROUGH  --   --   --   --   --  30*  --   --   --     Estimated Creatinine Clearance: 47.5 mL/min (by C-G formula based on SCr of 1.51 mg/dL (H)).    Allergies  Allergen Reactions  . Sulfa Antibiotics Rash   Antimicrobials this admission: Vancomycin 10/21 >>  Cefepime 10/21 >> 10/22 Zosyn 10/22>>  Dose adjustments this admission: 10/24.  Vanc dose changed to 1 gm q36 hours  Microbiology results: 10/21 BCx: ng 10/21 UCx: Pseudomonas 10/21 Sputum: pending  08/26/16  MRSA PCR: positive  Recent Results (from the past 240 hour(s))  Urine culture     Status: Abnormal   Collection Time: 10/15/16  3:56 AM  Result Value Ref Range Status   Specimen Description URINE, CLEAN CATCH  Final   Special Requests NONE  Final   Culture >=100,000 COLONIES/mL PSEUDOMONAS AERUGINOSA (A)  Final   Report Status 10/18/2016 FINAL  Final   Organism ID, Bacteria PSEUDOMONAS AERUGINOSA (A)  Final      Susceptibility   Pseudomonas aeruginosa - MIC*    CEFTAZIDIME 4  SENSITIVE Sensitive     CIPROFLOXACIN <=0.25 SENSITIVE Sensitive     GENTAMICIN <=1 SENSITIVE Sensitive     IMIPENEM 2 SENSITIVE Sensitive     PIP/TAZO 8 SENSITIVE Sensitive     CEFEPIME <=1 SENSITIVE Sensitive     * >=100,000 COLONIES/mL PSEUDOMONAS AERUGINOSA  Blood Culture (routine x 2)     Status: None   Collection Time: 10/15/16  4:01 AM  Result Value Ref Range Status   Specimen Description BLOOD RIGHT HAND  Final   Special Requests BOTTLES DRAWN AEROBIC AND ANAEROBIC [place]  Final   Culture NO GROWTH 5 DAYS  Final   Report Status 10/20/2016 FINAL  Final  Blood Culture (routine x 2)     Status: None   Collection Time: 10/15/16  4:18 AM  Result Value Ref Range Status   Specimen Description BLOOD LEFT ANTECUBITAL  Final   Special Requests BOTTLES DRAWN AEROBIC AND ANAEROBIC [place]  Final   Culture NO GROWTH 5 DAYS  Final   Report Status 10/20/2016 FINAL  Final  Culture, Urine     Status: Abnormal   Collection Time: 10/16/16  4:45 PM  Result Value Ref Range Status   Specimen Description URINE, CLEAN CATCH  Final  Special Requests NONE  Final   Culture >=100,000 COLONIES/mL PSEUDOMONAS AERUGINOSA (A)  Final   Report Status 10/18/2016 FINAL  Final   Organism ID, Bacteria PSEUDOMONAS AERUGINOSA (A)  Final      Susceptibility   Pseudomonas aeruginosa - MIC*    CEFTAZIDIME 2 SENSITIVE Sensitive     CIPROFLOXACIN <=0.25 SENSITIVE Sensitive     GENTAMICIN <=1 SENSITIVE Sensitive     IMIPENEM 2 SENSITIVE Sensitive     PIP/TAZO 8 SENSITIVE Sensitive     CEFEPIME <=1 SENSITIVE Sensitive     * >=100,000 COLONIES/mL PSEUDOMONAS AERUGINOSA  MRSA PCR Screening     Status: Abnormal   Collection Time: 10/17/16 12:53 AM  Result Value Ref Range Status   MRSA by PCR POSITIVE (A) NEGATIVE Final    Comment:        The GeneXpert MRSA Assay (FDA approved for NASAL specimens only), is one component of a comprehensive MRSA colonization surveillance program. It is not intended to  diagnose MRSA infection nor to guide or monitor treatment for MRSA infections. RESULT CALLED TO, READ BACK BY AND VERIFIED WITH: [place] ON [id number] BY FORSYTH K    Thank you for allowing pharmacy to be a part of this patient's care.  Hart Robinsons, PharmD Clinical Pharmacist Pager:  [phone] 10/21/2016   10/21/2016 9:23 AM

## 2016-10-21 NOTE — Progress Notes (Signed)
PROGRESS NOTE    [name]  AOZ:[id number] DOB: 27-Mar-1951 DOA: 10/15/2016 PCP: Lala Lund, MD    Brief Narrative:  65 year-old male with a PMHx of IDDM, steroid dependent Rheumatoid arthritis, PVD s/p L BKA, Combined CHF (EF30-35%), CKD stage 3, and HLD presented with complaints of perineal and rectal pain. He had an indwelling catheter on arrival, which he stated made him feel a sense of frequent urination. He was found to have sepsis related to possible UTI and HCAP. He has been started on IV antibiotics.  Dr Luan Pulling has recommended 10 days of IV antibiotics, and patient's SNF has refused to accept the patient back. Patient and family would like to go home instead of SNF.  Daughter felt that he is ready by Monday.    Assessment & Plan:   Active Problems:   Insulin dependent diabetes mellitus ([place])   HCAP (healthcare-associated pneumonia)   Urinary tract infection without hematuria   Sepsis ([place])  1. Sepsis. Patient has been bolused IV fluids, per sepsis protocol. Blood pressures have improved. Blood cultures have shown no growth. We'll continue IV antibiotics Zosyn and Vancomycin Day 6/10.  2. UTI without hematuria. Patient was started on IV antibiotics. Patient had an indwelling foley catheter placed on arrival.Urine culture was positive for Pseudomonas. Will follow-up sensitivities. Urology Dr Tammi Klippel has been contacted and he will likely follow up outpatient for chronic foley maintenance.  3. HCAP. CT abdomen had shown evidence of RLL pneumonia and effusion.Patient had dedicated CT chest that showed persistent right lower lobe consolidation and associated effusion. Pulmonology following and feels that it is less likely to be an empyema. Recommendations are to continue current antibiotics, vancomycin and Zosyn.  It is day 6/10.  4. Chronic combined CHFwith EF 30-35%. Volume status appears to be stable. Demadex currently on hold due to sepsis. Continue to follow. 5. CKD stage  3. Creatinine is at baseline. Continue to monitor input and output. 6. Normocytic anemia. No reported evidence of bleeding. Possibly related to dilution as well as chronic disease. Stool negative for occult blood. Anemia panel reviewed and likely a combination of iron deficiency and chronic disease. He was transfused 2 units of packed red blood cells on 10/22 with symptomatic improvement. 7. Rheumatoid arthritis. He is steroid dependent. Continue current outpatient regimen of prednisone and plaquenil. Will monitor closely. 8. IDDM. Blood sugars are currently stable with sliding scale insulin. Continue to monitor 9. HTN. Pressures arestable. Continue Toprol. 10. HLD. Continue statin. 11. BPH with indwelling foley catheter. Continue Flomax. 12. Constipation. Patient has had multiple bowel movements. Continue Miramax. 13. Left BKA wound. Per [place] nurse, will continue outpatient care regimen and implement enzymatic debridement using Santyl. A pressure redistribution heel boot has been provided for the right heel and a prophylactic dressing has been ordered for the coccyx. Patient has been advised to turn and reposition to relieve pressure to the coccyx.  DVT prophylaxis: Lovenox  Code Status: FULL  Family Communication: Wife bedside Disposition Plan: Discharge Monday.   Consultants:   Pulmonologist   Procedures:   Transfused 2 units of PRBCs   Antimicrobials:   Cefepime 10/21 >> 10/22   Vancomycin 10/21 >>   Zosyn 10/22 >>    Subjective: Feels better. No longer vomiting.   Objective: Vitals:   10/20/16 2206 10/21/16 0225 10/21/16 0641 10/21/16 0838  BP: 123/61 (!) 142/52 (!) 131/45 (!) 138/52  Pulse: 86 87 88 90  Resp: 16 20 20    Temp: 99.1 F (37.3  C) 98.7 F (37.1 C) 98.6 F (37 C)   TempSrc: Oral Oral Oral   SpO2: 97% 100% 100%   Weight:        Intake/Output Summary (Last 24 hours) at 10/21/16 1047 Last data filed at 10/21/16 0641  Gross per 24 hour  Intake               300 ml  Output              650 ml  Net             -350 ml   Filed Weights   10/15/16 0700  Weight: 82 kg (180 lb 12.4 oz)   Examination: General exam: Appears calm and comfortable  Respiratory system: Clear to auscultation. Respiratory effort normal. Cardiovascular system: S1 & S2 heard, RRR. No JVD, murmurs, rubs, gallops or clicks. No pedal edema. Gastrointestinal system: Abdomen is nondistended, soft and nontender. No organomegaly or masses felt. Normal bowel sounds heard. Central nervous system: Alert and oriented. No focal neurological deficits. Extremities: Symmetric 5 x 5 power. Skin: No rashes, lesions or ulcers Psychiatry: Judgement and insight appear normal. Mood & affect appropriate.   Data Reviewed: I have personally reviewed following labs and imaging studies  Recent Labs Lab 10/15/16 0219 10/16/16 0524 10/17/16 0610 10/18/16 0609 10/19/16 0624  WBC 18.5* 23.7* 20.3* 13.5* 10.1  NEUTROABS 13.1*  --   --   --   --   HGB 9.1* 7.1* 9.5* 9.1* 9.3*  HCT 28.9* 22.1* 30.1* 28.6* 29.4*  MCV 96.0 97.8 92.9 92.0 93.3  PLT 199 138* 142* 139* 132*    Recent Labs Lab 10/16/16 0524 10/17/16 0610 10/18/16 0609 10/19/16 0624 10/21/16 0500  NA 138 135 137 138 136  K 4.3 4.1 3.8 4.0 3.7  CL 109 107 109 110 109  CO2 23 20* 23 24 22   GLUCOSE 97 145* 123* 104* 94  BUN 80* 80* 67* 55* 43*  CREATININE 1.88* 1.72* 1.62* 1.55* 1.51*  CALCIUM 8.3* 8.4* 8.3* 8.3* 8.2*    Recent Labs Lab 10/15/16 0219  AST 38  ALT 40  ALKPHOS 390*  BILITOT 0.7  PROT 6.8  ALBUMIN 2.7*    Recent Labs Lab 10/20/16 0740 10/20/16 1130 10/20/16 1723 10/20/16 2106 10/21/16 0747  GLUCAP 126* 129* 149* 175* 90    Recent Labs Lab 10/15/16 0234 10/15/16 0605  LATICACIDVEN 2.32* 1.19    Recent Results (from the past 240 hour(s))  Urine culture     Status: Abnormal   Collection Time: 10/15/16  3:56 AM  Result Value Ref Range Status   Specimen Description URINE, CLEAN  CATCH  Final   Special Requests NONE  Final   Culture >=100,000 COLONIES/mL PSEUDOMONAS AERUGINOSA (A)  Final   Report Status 10/18/2016 FINAL  Final   Organism ID, Bacteria PSEUDOMONAS AERUGINOSA (A)  Final      Susceptibility   Pseudomonas aeruginosa - MIC*    CEFTAZIDIME 4 SENSITIVE Sensitive     CIPROFLOXACIN <=0.25 SENSITIVE Sensitive     GENTAMICIN <=1 SENSITIVE Sensitive     IMIPENEM 2 SENSITIVE Sensitive     PIP/TAZO 8 SENSITIVE Sensitive     CEFEPIME <=1 SENSITIVE Sensitive     * >=100,000 COLONIES/mL PSEUDOMONAS AERUGINOSA  Blood Culture (routine x 2)     Status: None   Collection Time: 10/15/16  4:01 AM  Result Value Ref Range Status   Specimen Description BLOOD RIGHT HAND  Final   Special Requests BOTTLES DRAWN  AEROBIC AND ANAEROBIC 6CC EACH  Final   Culture NO GROWTH 5 DAYS  Final   Report Status 10/20/2016 FINAL  Final  Blood Culture (routine x 2)     Status: None   Collection Time: 10/15/16  4:18 AM  Result Value Ref Range Status   Specimen Description BLOOD LEFT ANTECUBITAL  Final   Special Requests BOTTLES DRAWN AEROBIC AND ANAEROBIC 8CC EACH  Final   Culture NO GROWTH 5 DAYS  Final   Report Status 10/20/2016 FINAL  Final  Culture, Urine     Status: Abnormal   Collection Time: 10/16/16  4:45 PM  Result Value Ref Range Status   Specimen Description URINE, CLEAN CATCH  Final   Special Requests NONE  Final   Culture >=100,000 COLONIES/mL PSEUDOMONAS AERUGINOSA (A)  Final   Report Status 10/18/2016 FINAL  Final   Organism ID, Bacteria PSEUDOMONAS AERUGINOSA (A)  Final      Susceptibility   Pseudomonas aeruginosa - MIC*    CEFTAZIDIME 2 SENSITIVE Sensitive     CIPROFLOXACIN <=0.25 SENSITIVE Sensitive     GENTAMICIN <=1 SENSITIVE Sensitive     IMIPENEM 2 SENSITIVE Sensitive     PIP/TAZO 8 SENSITIVE Sensitive     CEFEPIME <=1 SENSITIVE Sensitive     * >=100,000 COLONIES/mL PSEUDOMONAS AERUGINOSA  MRSA PCR Screening     Status: Abnormal   Collection Time:  10/17/16 12:53 AM  Result Value Ref Range Status   MRSA by PCR POSITIVE (A) NEGATIVE Final    Comment:        The GeneXpert MRSA Assay (FDA approved for NASAL specimens only), is one component of a comprehensive MRSA colonization surveillance program. It is not intended to diagnose MRSA infection nor to guide or monitor treatment for MRSA infections. RESULT CALLED TO, READ BACK BY AND VERIFIED WITH: THOMAS K AT 0301 ON [id number] BY FORSYTH K       Scheduled Meds: . aspirin  81 mg Oral q morning - 10a  . atorvastatin  80 mg Oral Daily  . collagenase   Topical Daily  . docusate sodium  100 mg Oral BID  . enoxaparin (LOVENOX) injection  40 mg Subcutaneous Q24H  . feeding supplement (PRO-STAT SUGAR FREE 64)  30 mL Oral BID  . hydroxychloroquine  200 mg Oral Daily  . insulin aspart  0-9 Units Subcutaneous TID WC  . mouth rinse  15 mL Mouth Rinse BID  . metoprolol succinate  37.5 mg Oral q morning - 10a  . mupirocin ointment  1 application Nasal BID  . olopatadine  1 drop Both Eyes BID  . omega-3 acid ethyl esters  2 g Oral BID  . piperacillin-tazobactam (ZOSYN)  IV  3.375 g Intravenous Q8H  . polyethylene glycol  17 g Oral BID  . predniSONE  7.5 mg Oral Q breakfast  . pregabalin  25 mg Oral Daily  . sodium chloride  500 mL Intravenous Once  . tamsulosin  0.4 mg Oral QHS  . thiamine  100 mg Oral Daily  . vancomycin  1,000 mg Intravenous Q36H  . vitamin C  500 mg Oral Daily  . zinc sulfate  220 mg Oral Daily   Continuous Infusions: . sodium chloride       LOS: 6 days   Time spent: 25 minutes   Orvan Falconer, MD [facility] If 7PM-7AM, please contact night-coverage [email or web address] Password [email or web address] 10/21/2016, 10:47 AM   By signing my name below, I, Collene Leyden, attest that this documentation  has been prepared under the direction and in the presence of Orvan Falconer, MD. Electronically signed: Collene Leyden, Scribe. 10/21/16 10:47 AM

## 2016-10-21 NOTE — Care Management (Signed)
Patient has decided to go home instead of SNF. Home Health ordered RN, SW, aide and PT orders. He will be apart of the [facility] program with [place]. Orders placed for a hospital bed, lift, WC and air mattress. Romualdo Bolk of [facility] notified and will obtain orders from chart. CSW and CM spoke with family and advised of all options, family still wants to take patient home.

## 2016-10-22 DIAGNOSIS — A4152 Sepsis due to Pseudomonas: Principal | ICD-10-CM

## 2016-10-22 LAB — GLUCOSE, CAPILLARY
GLUCOSE-CAPILLARY: 264 mg/dL — AB (ref 65–99)
GLUCOSE-CAPILLARY: 242 mg/dL — AB (ref 65–99)
Glucose-Capillary: 206 mg/dL — ABNORMAL HIGH (ref 65–99)
Glucose-Capillary: 264 mg/dL — ABNORMAL HIGH (ref 65–99)

## 2016-10-22 MED ORDER — PIPERACILLIN-TAZOBACTAM 3.375 G IVPB: 3.3750 g | Freq: Three times a day (TID) | INTRAVENOUS | 0 refills | Status: DC

## 2016-10-22 MED ORDER — TAMSULOSIN HCL 0.4 MG PO CAPS: 0.4000 mg | ORAL_CAPSULE | Freq: Every day | ORAL | 0 refills | Status: DC

## 2016-10-22 MED ORDER — PREDNISONE 2.5 MG PO TABS: 7.5000 mg | ORAL_TABLET | Freq: Every day | ORAL | 0 refills | Status: DC

## 2016-10-22 MED ORDER — INSULIN NPH (HUMAN) (ISOPHANE) 100 UNIT/ML ~~LOC~~ SUSP: 5.0000 [IU] | Freq: Two times a day (BID) | SUBCUTANEOUS | 11 refills | Status: DC

## 2016-10-22 MED ORDER — ALPRAZOLAM 0.25 MG PO TABS: 0.2500 mg | ORAL_TABLET | Freq: Three times a day (TID) | ORAL | 0 refills | Status: DC | PRN

## 2016-10-22 MED ORDER — PREGABALIN 25 MG PO CAPS
ORAL_CAPSULE | ORAL | 0 refills | Status: DC
Start: 2016-10-22 — End: 2016-11-11

## 2016-10-22 MED ORDER — METOPROLOL SUCCINATE ER 25 MG PO TB24: 37.5000 mg | ORAL_TABLET | Freq: Every morning | ORAL | 0 refills | Status: DC

## 2016-10-22 MED ORDER — ATORVASTATIN CALCIUM 80 MG PO TABS: 80.0000 mg | ORAL_TABLET | Freq: Every day | ORAL | 0 refills | Status: DC

## 2016-10-22 MED ORDER — OXYCODONE HCL 5 MG PO TABS: ORAL_TABLET | ORAL | 0 refills | Status: DC

## 2016-10-22 NOTE — Progress Notes (Signed)
CM refaxed prescription for IV antibiotic and glucometer order and home health.  Contacted and representative confirmed information was received.

## 2016-10-22 NOTE — Discharge Summary (Signed)
Physician Discharge Summary  [name] ZDG:[id number] DOB: 06-Jun-1951 DOA: 10/15/2016  PCP: Lala Lund, MD  Admit date: 10/15/2016 Discharge date: 10/22/2016  Admitted From: SNF.  Disposition:  Home  Recommendations for Outpatient Follow-up:  1. Follow up with PCP in 1-2 weeks. 2. Follow up with Dr. Tammi Klippel, Urology for chronic foley maintenance.  Home Health: Yes Equipment/Devices: Wound care, hospital bed, lift, and air mattress.  Discharge Condition: Stable CODE STATUS: Full Diet recommendation: Carb Modified   HPI:  Patient was admitted by Dr Darrick Meigs on Oct 15, 2016 for rectal and perineal pain, found to have UTI and HCAP, and was treated.  As per his H and P:  " [name]  is a 65 y.o. male, with history of diabetes mellitus, combined CHF, CAD status post CABG,  chronic kidney disease stage III requiring intermittent hemodialysis, currently off dialysis, steroid dependent rheumatoid arthritis, recent long hospitalization at [facility] for complications from peripheral vascular disease status post left BKA, was brought to the hospital from [facility] center for rectal pain, bladder spasm.  Patient was recently discharged from [facility] on 09/09/2016 after he was treated for healthcare associated pneumonia, at that time patient was discharged with Foley catheter as per patient's wife. He also has chronic wound on left stump, and has been followed by orthopedics and wound care at [facility].  Today in the ED patient found to have WBC 18,000, temperature 101, lactate 2.32.  UA showed positive nitrite, too numerous to count WBC CT scan of the abdomen and pelvis showed moderate to large amount of retained large stool without bowel obstruction, also showed small residual right pleural effusion with pleural thickening. Right lower lobe focal pneumonia. Patient empirically started on vancomycin and cefepime per pharmacy consultation.  At this time patient is very somnolent after he  got Dilaudid and Phenergan in the ED, and unable to provide any significant history. As per wife, he did not have any chest pain, no shortness of breath. No nausea vomiting or diarrhea.  1. HOSPITAL COURE:  65 year-old male with a PMHx of IDDM, steroid dependent Rheumatoid arthritis, PVD s/p L BKA, Combined CHF (EF30-35%), CKD stage 3, and HLD presented with complaints of perineal and rectal pain. He had an indwelling catheter on arrival, which he stated made him feel a sense of frequent urination. He was found to have sepsis related to possible UTI and HCAP. He was immediately started on IV antibiotics, namingly Van/Zosyn, with pharmacy dosing.  His urine culture showed pseudomonas sensistive to Zosyn.  He insisted on having his foley change, and I spoke with Dr Tresa Moore, our urology consult.  He recommended outpatient follow up to have it changed.  He will assist with making follow up appointment for [name].  He was seen in consultation with  Dr Luan Pulling had recommended 10 days of IV antibiotics which he is currently on day 7 out of 10. His last dose of IV Vancomycin was given today.  He will need to continue with IV Zosyn for another 3 days. He complained of pain, and requested methadone and increasing dose of narcotics.  I spoke with him and his wife, and giving that he is a full code, with all scopes of care provided, that it would be difficult to give him the palliative care medication he requested.  Also during his admission, he was noted to have a very low Hgb level. He was transfused with 2 units of PRBC on 10/22 with significant improvements  and no further complications. Unfortunately, the SNF where he came from did not want to take him back.  His wife stated that the SNF feels that he and his wife were too demanding.  Case manager has been working fervently and was able to get him accepted, however, his family did not want to go there.  He and his family wants to go home.  However, he said his house  wasn't ready.  Plan then was to keep him until Monday, hopefully his house would be ready.  He was encourage to accept to go to SNF, then after his house is ready, he can go home.  His daughter Santiago Glad subsequently call me and said it is ready today.  Case manager was able to coordinate his hospital bed, his lift, so he can go home today.  His wife was taught to give 3 more days of Zosyn.  He no longer need IV Lucianne Lei.  He will have RN and HHA at home.  Furthermore, he did not have a local PCP.  Coordinate with Dr Wolfgang Phoenix, and our secretary was able to secure a follow up with Dr Wolfgang Phoenix as new patient.  Today, he said his glucometer was n't working.  He will need to get another glucometer.  He is stable medically to be discharge, though disposition has been difficult since he was fired from his SNF.  He will have his heplock so that he can finish another 3 days of Zosyn.  PICC line was considered, but it has its own risk, and toward the end, it was decided that he would be better off with peripheral IV.  For his left BKA wound. Per [place] nurse, will continue outpatient care regimen and implement enzymatic debridement using Santyl. A pressure redistribution heel boot has been provided for the right heel and a prophylactic dressing has been ordered for the coccyx. Patient has been advised to turn and reposition to relieve pressure to the coccyx.  He is now stable for discharge and will follow up as outline above.   Thank you and Good Day.   Discharge Diagnoses:  Active Problems:   Insulin dependent diabetes mellitus ([place])   HCAP (healthcare-associated pneumonia)   Urinary tract infection without hematuria   Sepsis [facility])   Consultants:   Pulmonologist   Wound care  Urology  Procedures:   Transfused 2 units of PRBCs on 10/22  Antimicrobials:   Cefepime 10/21 >> 10/22   Vancomycin 10/21 >>   Zosyn 10/22 >>   Discharge Instructions  Discharge Instructions    Diet - low sodium heart healthy     Complete by:  As directed    Discharge instructions    Complete by:  As directed    Finish the antibiotic Zosyn IV until end of Monday.  Follow up with Dr Wolfgang Phoenix as scheduled.  Follow up next week with urologist so your foley can be changed at certain interval.   Increase activity slowly    Complete by:  As directed        Medication List    STOP taking these medications   hydroxychloroquine 200 MG tablet Commonly known as:  PLAQUENIL     TAKE these medications   acetaminophen 325 MG tablet Commonly known as:  TYLENOL Take 650 mg by mouth every 6 (six) hours as needed for mild pain or moderate pain.   ALPRAZolam 0.25 MG tablet Commonly known as:  XANAX Take 1 tablet (0.25 mg total) by mouth 3 (three) times daily as  needed for anxiety.   aspirin 81 MG chewable tablet Chew 81 mg by mouth every morning.   atorvastatin 80 MG tablet Commonly known as:  LIPITOR Take 80 mg by mouth daily.   docusate sodium 100 MG capsule Commonly known as:  COLACE Take 100 mg by mouth 2 (two) times daily.   HUMULIN N 100 UNIT/ML injection Generic drug:  insulin NPH Human Give 5 units subcutaneous twice a day   iron polysaccharides 150 MG capsule Commonly known as:  NIFEREX Take 150 mg by mouth daily.   levalbuterol 0.63 MG/3ML nebulizer solution Commonly known as:  XOPENEX Take 3 mLs (0.63 mg total) by nebulization every 4 (four) hours as needed for wheezing or shortness of breath.   Melatonin 3 MG Tabs Take 2 tablets by mouth at bedtime   metoprolol succinate 25 MG 24 hr tablet Commonly known as:  TOPROL-XL Take 1.5 tablets (37.5 mg total) by mouth every morning.   nystatin cream Commonly known as:  MYCOSTATIN Apply 1 application topically daily. Apply to bottom rash QD   olopatadine 0.1 % ophthalmic solution Commonly known as:  PATANOL Place 1 drop into both eyes 2 (two) times daily.   omega-3 acid ethyl esters 1 g capsule Commonly known as:  LOVAZA Take 2 g by mouth 2  (two) times daily.   oxyCODONE 5 MG immediate release tablet Commonly known as:  Oxy IR/ROXICODONE Take 1 tablet by mouth three times daily.   OXYGEN Inhale 2 L into the lungs continuous. O 2 at 2 LPM via n/c to keep O2 sat above 90 % every shift   piperacillin-tazobactam 3.375 GM/50ML IVPB Commonly known as:  ZOSYN Inject 50 mLs (3.375 g total) into the vein every 8 (eight) hours.   polyethylene glycol powder powder Commonly known as:  MIRALAX Take 17 g by mouth 2 (two) times daily.   predniSONE 2.5 MG tablet Commonly known as:  DELTASONE Take 3 tablets (7.5 mg total) by mouth daily with breakfast.   pregabalin 25 MG capsule Commonly known as:  LYRICA Take one capsule by mouth at bedtime   PRO-STAT 64 PO Take 30 mLs by mouth 2 (two) times daily.   REFRESH P.M. OP Apply 1 drop to eye daily as needed (FOR DY EYES). ONE APPLICATION BOTH EYES. For dry eyes not relieved by theratears every 6 hours   SANTYL ointment Generic drug:  collagenase Apply 1 application topically daily. Apply to residual surgical site per tx orders   tamsulosin 0.4 MG Caps capsule Commonly known as:  FLOMAX Take 0.4 mg by mouth at bedtime.   THERA-M ENHANCED PO 1 tablet by mouth daily   THERATEARS 0.25 % Soln Generic drug:  Carboxymethylcellulose Sodium Apply 2 drops to both eyes three times a day   thiamine 100 MG tablet Commonly known as:  VITAMIN B-1 Take 100 mg by mouth daily.   torsemide 10 MG tablet Commonly known as:  DEMADEX Give 20 mg by mouth once a day every other day   traZODone 50 MG tablet Commonly known as:  DESYREL Take 50 mg by mouth at bedtime.   VENELEX Oint Apply 1 application topically 3 (three) times daily. Apply to sacrum every shift day, evening, Night and as needed.   vitamin C 500 MG tablet Commonly known as:  ASCORBIC ACID Take 500 mg by mouth daily.   zinc sulfate 220 (50 Zn) MG capsule Take 220 mg by mouth daily.       Allergies  Allergen  Reactions  .  Sulfa Antibiotics Rash    Procedures/Studies: Ct Abdomen Pelvis Wo Contrast  Result Date: 10/15/2016 CLINICAL DATA:  Rectal pain tonight. History of diabetes, acute renal failure, loculated pleural effusion. EXAM: CT ABDOMEN AND PELVIS WITHOUT CONTRAST TECHNIQUE: Multidetector CT imaging of the abdomen and pelvis was performed following the standard protocol without IV contrast. Oral contrast administered. COMPARISON:  CT chest September 08, 2016 FINDINGS: LOWER CHEST: Small residual RIGHT pleural effusion with thickened pleura and RIGHT lower lobe consolidation. Tree-in-bud infiltrates through the included bilateral lungs. Resolution of LEFT pleural effusion with residual LEFT lung base atelectasis/scarring. Heart size is normal. Severe coronary artery calcifications. Status post median sternotomy. HEPATOBILIARY: Multiple tiny layering gallstones without CT findings of acute cholecystitis. Noncontrast liver is normal. PANCREAS: Predominate fatty replaced pancreas. SPLEEN: Normal. ADRENALS/URINARY TRACT: Kidneys are orthotopic, demonstrating normal size and morphology. Severe vascular calcifications. No nephrolithiasis, hydronephrosis; limited assessment for renal masses on this nonenhanced examination. The unopacified ureters are normal in course and caliber. Urinary bladder decompressed containing a Foley catheter and retaining bulb. Normal adrenal glands. STOMACH/BOWEL: The stomach, small and large bowel are normal in course and caliber without inflammatory changes. Enteric contrast has not yet reached the distal small bowel. Moderate to large amount of retained large bowel stool. Mild circumferential rectal wall thickening without fat stranding suggesting internal hemorrhoids. Small bowel feces compatible chronic stasis. Normal appendix. VASCULAR/LYMPHATIC: Aortoiliac vessels are normal in course and caliber, severe atherosclerosis. No lymphadenopathy by CT size criteria. REPRODUCTIVE:  Normal. OTHER: No intraperitoneal free fluid or free air. MUSCULOSKELETAL: Non-acute. Moderate fat containing umbilical hernia. Moderate LEFT fat containing inguinal hernia. Status post LEFT hip total arthroplasty resulting in streak artifact. Moderate degenerative change of the RIGHT hip. Severe L3-4 disc height loss, endplate irregularity compatible with degenerative disc resulting in severe in L3-4 neural foraminal narrowing. Moderate L2-3 degenerative disc. IMPRESSION: Moderate to large amount of retained large bowel stool without bowel obstruction or acute intra-abdominal/ pelvic process. Suspected internal hemorrhoids. Small residual RIGHT pleural effusion with pleural thickening, empyema not excluded. RIGHT lower lobe focal pneumonia. Diffuse tree-in-bud infiltrates may be infectious or inflammatory. Severe atherosclerosis. Electronically Signed   By: Elon Alas M.D.   On: 10/15/2016 05:02   Ct Chest Wo Contrast  Result Date: 10/16/2016 CLINICAL DATA:  HCAP -seen on CT abdomen/pelvis done 10/15/16. Hx of recent left leg amputation,loculated pleural effusion 09/09/16. Hx of rheumatoid arthritis,HTN,CAD,DM/bbj EXAM: CT CHEST WITHOUT CONTRAST TECHNIQUE: Multidetector CT imaging of the chest was performed following the standard protocol without IV contrast. COMPARISON:  CT of the abdomen and pelvis 10/15/2016 FINDINGS: Cardiovascular: Heart is mildly enlarged. There is extensive coronary artery calcification. Calcification at the root of the aorta. Normal noncontrast appearance of the pulmonary arteries. Mediastinum/Nodes: Status post median sternotomy. The visualized portion of the thyroid gland has a normal appearance. Esophagus is normal in appearance. Lungs/Pleura: Small right pleural effusion is present. There is consolidation other right lower lobe. Small nodular opacities are identified within the lung bases, right middle lobe, and lingula are favored to be infectious or inflammatory. Airways  are patent. Upper Abdomen: Numerous layering calcified gallstones are present. There is extensive atherosclerotic calcification the branch vessels in the upper abdomen. Calcifications in the kidneys may be vascular calcifications or calculi. Musculoskeletal: No chest wall mass or suspicious bone lesions identified. Chronic changes are seen in both shoulders consistent with rotator cuff injury and glenohumeral osteoarthritis. IMPRESSION: 1. Right pleural effusion and right lower lobe consolidation. 2. Small nodular opacities within the lungs are likely  infectious or inflammatory. 3. Coronary artery disease. 4. Status post median sternotomy. 5. Cholelithiasis. 6. Renal arterial calcifications versus intrarenal stones. Electronically Signed   By: Nolon Nations M.D.   On: 10/16/2016 11:29    (Echo, Carotid, EGD, Colonoscopy, ERCP)    Subjective:   Discharge Exam: Vitals:   10/21/16 1500 10/21/16 1919  BP: 140/73 115/75  Pulse: 72 68  Resp: 20 18  Temp: 98.2 F (36.8 C) 98 F (36.7 C)   Vitals:   10/21/16 0838 10/21/16 1500 10/21/16 1919 10/21/16 2033  BP: (!) 138/52 140/73 115/75   Pulse: 90 72 68   Resp:  20 18   Temp:  98.2 F (36.8 C) 98 F (36.7 C)   TempSrc:  Oral Oral   SpO2:  97% 99% 97%  Weight:        General: Pt is alert, awake, not in acute distress Cardiovascular: RRR, S1/S2 +, no rubs, no gallops Respiratory: CTA bilaterally, no wheezing, no rhonchi Abdominal: Soft, NT, ND, bowel sounds + Extremities: no edema, no cyanosis    The results of significant diagnostics from this hospitalization (including imaging, microbiology, ancillary and laboratory) are listed below for reference.     Microbiology: Recent Results (from the past 240 hour(s))  Urine culture     Status: Abnormal   Collection Time: 10/15/16  3:56 AM  Result Value Ref Range Status   Specimen Description URINE, CLEAN CATCH  Final   Special Requests NONE  Final   Culture >=100,000 COLONIES/mL  PSEUDOMONAS AERUGINOSA (A)  Final   Report Status 10/18/2016 FINAL  Final   Organism ID, Bacteria PSEUDOMONAS AERUGINOSA (A)  Final      Susceptibility   Pseudomonas aeruginosa - MIC*    CEFTAZIDIME 4 SENSITIVE Sensitive     CIPROFLOXACIN <=0.25 SENSITIVE Sensitive     GENTAMICIN <=1 SENSITIVE Sensitive     IMIPENEM 2 SENSITIVE Sensitive     PIP/TAZO 8 SENSITIVE Sensitive     CEFEPIME <=1 SENSITIVE Sensitive     * >=100,000 COLONIES/mL PSEUDOMONAS AERUGINOSA  Blood Culture (routine x 2)     Status: None   Collection Time: 10/15/16  4:01 AM  Result Value Ref Range Status   Specimen Description BLOOD RIGHT HAND  Final   Special Requests BOTTLES DRAWN AEROBIC AND ANAEROBIC [place]  Final   Culture NO GROWTH 5 DAYS  Final   Report Status 10/20/2016 FINAL  Final  Blood Culture (routine x 2)     Status: None   Collection Time: 10/15/16  4:18 AM  Result Value Ref Range Status   Specimen Description BLOOD LEFT ANTECUBITAL  Final   Special Requests BOTTLES DRAWN AEROBIC AND ANAEROBIC 8CC EACH  Final   Culture NO GROWTH 5 DAYS  Final   Report Status 10/20/2016 FINAL  Final  Culture, Urine     Status: Abnormal   Collection Time: 10/16/16  4:45 PM  Result Value Ref Range Status   Specimen Description URINE, CLEAN CATCH  Final   Special Requests NONE  Final   Culture >=100,000 COLONIES/mL PSEUDOMONAS AERUGINOSA (A)  Final   Report Status 10/18/2016 FINAL  Final   Organism ID, Bacteria PSEUDOMONAS AERUGINOSA (A)  Final      Susceptibility   Pseudomonas aeruginosa - MIC*    CEFTAZIDIME 2 SENSITIVE Sensitive     CIPROFLOXACIN <=0.25 SENSITIVE Sensitive     GENTAMICIN <=1 SENSITIVE Sensitive     IMIPENEM 2 SENSITIVE Sensitive     PIP/TAZO 8 SENSITIVE Sensitive  CEFEPIME <=1 SENSITIVE Sensitive     * >=100,000 COLONIES/mL PSEUDOMONAS AERUGINOSA  MRSA PCR Screening     Status: Abnormal   Collection Time: 10/17/16 12:53 AM  Result Value Ref Range Status   MRSA by PCR POSITIVE (A)  NEGATIVE Final    Comment:        The GeneXpert MRSA Assay (FDA approved for NASAL specimens only), is one component of a comprehensive MRSA colonization surveillance program. It is not intended to diagnose MRSA infection nor to guide or monitor treatment for MRSA infections. RESULT CALLED TO, READ BACK BY AND VERIFIED WITH: THOMAS K AT 0301 ON [id number] BY FORSYTH K      Labs: BNP (last 3 results)  Recent Labs  08/26/16 0509 09/02/16 1948  BNP >4,500.0* >3,299.2*   Basic Metabolic Panel:  Recent Labs Lab 10/16/16 0524 10/17/16 0610 10/18/16 0609 10/19/16 0624 10/21/16 0500  NA 138 135 137 138 136  K 4.3 4.1 3.8 4.0 3.7  CL 109 107 109 110 109  CO2 23 20* 23 24 22   GLUCOSE 97 145* 123* 104* 94  BUN 80* 80* 67* 55* 43*  CREATININE 1.88* 1.72* 1.62* 1.55* 1.51*  CALCIUM 8.3* 8.4* 8.3* 8.3* 8.2*   Liver Function Tests: No results for input(s): AST, ALT, ALKPHOS, BILITOT, PROT, ALBUMIN in the last 168 hours. No results for input(s): LIPASE, AMYLASE in the last 168 hours. No results for input(s): AMMONIA in the last 168 hours. CBC:  Recent Labs Lab 10/16/16 0524 10/17/16 0610 10/18/16 0609 10/19/16 0624  WBC 23.7* 20.3* 13.5* 10.1  HGB 7.1* 9.5* 9.1* 9.3*  HCT 22.1* 30.1* 28.6* 29.4*  MCV 97.8 92.9 92.0 93.3  PLT 138* 142* 139* 132*   CBG:  Recent Labs Lab 10/21/16 1127 10/21/16 1624 10/21/16 2318 10/22/16 0802 10/22/16 1125  GLUCAP 163* 180* 231* 206* 264*  264*  Urinalysis    Component Value Date/Time   COLORURINE YELLOW 10/15/2016 0152   APPEARANCEUR HAZY (A) 10/15/2016 0152   LABSPEC 1.015 10/15/2016 0152   PHURINE 6.0 10/15/2016 0152   GLUCOSEU NEGATIVE 10/15/2016 0152   HGBUR LARGE (A) 10/15/2016 0152   BILIRUBINUR NEGATIVE 10/15/2016 0152   KETONESUR NEGATIVE 10/15/2016 0152   PROTEINUR 100 (A) 10/15/2016 0152   UROBILINOGEN 0.2 08/12/2014 2358   NITRITE POSITIVE (A) 10/15/2016 0152   LEUKOCYTESUR LARGE (A) 10/15/2016 0152    Microbiology Recent Results (from the past 240 hour(s))  Urine culture     Status: Abnormal   Collection Time: 10/15/16  3:56 AM  Result Value Ref Range Status   Specimen Description URINE, CLEAN CATCH  Final   Special Requests NONE  Final   Culture >=100,000 COLONIES/mL PSEUDOMONAS AERUGINOSA (A)  Final   Report Status 10/18/2016 FINAL  Final   Organism ID, Bacteria PSEUDOMONAS AERUGINOSA (A)  Final      Susceptibility   Pseudomonas aeruginosa - MIC*    CEFTAZIDIME 4 SENSITIVE Sensitive     CIPROFLOXACIN <=0.25 SENSITIVE Sensitive     GENTAMICIN <=1 SENSITIVE Sensitive     IMIPENEM 2 SENSITIVE Sensitive     PIP/TAZO 8 SENSITIVE Sensitive     CEFEPIME <=1 SENSITIVE Sensitive     * >=100,000 COLONIES/mL PSEUDOMONAS AERUGINOSA  Blood Culture (routine x 2)     Status: None   Collection Time: 10/15/16  4:01 AM  Result Value Ref Range Status   Specimen Description BLOOD RIGHT HAND  Final   Special Requests BOTTLES DRAWN AEROBIC AND ANAEROBIC [place]  Final  Culture NO GROWTH 5 DAYS  Final   Report Status 10/20/2016 FINAL  Final  Blood Culture (routine x 2)     Status: None   Collection Time: 10/15/16  4:18 AM  Result Value Ref Range Status   Specimen Description BLOOD LEFT ANTECUBITAL  Final   Special Requests BOTTLES DRAWN AEROBIC AND ANAEROBIC 8CC EACH  Final   Culture NO GROWTH 5 DAYS  Final   Report Status 10/20/2016 FINAL  Final  Culture, Urine     Status: Abnormal   Collection Time: 10/16/16  4:45 PM  Result Value Ref Range Status   Specimen Description URINE, CLEAN CATCH  Final   Special Requests NONE  Final   Culture >=100,000 COLONIES/mL PSEUDOMONAS AERUGINOSA (A)  Final   Report Status 10/18/2016 FINAL  Final   Organism ID, Bacteria PSEUDOMONAS AERUGINOSA (A)  Final      Susceptibility   Pseudomonas aeruginosa - MIC*    CEFTAZIDIME 2 SENSITIVE Sensitive     CIPROFLOXACIN <=0.25 SENSITIVE Sensitive     GENTAMICIN <=1 SENSITIVE Sensitive     IMIPENEM 2 SENSITIVE  Sensitive     PIP/TAZO 8 SENSITIVE Sensitive     CEFEPIME <=1 SENSITIVE Sensitive     * >=100,000 COLONIES/mL PSEUDOMONAS AERUGINOSA  MRSA PCR Screening     Status: Abnormal   Collection Time: 10/17/16 12:53 AM  Result Value Ref Range Status   MRSA by PCR POSITIVE (A) NEGATIVE Final    Comment:        The GeneXpert MRSA Assay (FDA approved for NASAL specimens only), is one component of a comprehensive MRSA colonization surveillance program. It is not intended to diagnose MRSA infection nor to guide or monitor treatment for MRSA infections. RESULT CALLED TO, READ BACK BY AND VERIFIED WITH: THOMAS K AT 0301 ON [id number] BY FORSYTH K      Time coordinating discharge: Over 30 minutes  SIGNED:   Orvan Falconer, MD FACP [facility] 10/22/2016, 11:59 AM Pager   If 7PM-7AM, please contact night-coverage [email or web address] Password [email or web address]  By signing my name below, I, Hilbert Odor, attest that this documentation has been prepared under the direction and in the presence of Orvan Falconer, MD. Electronically signed: Hilbert Odor, Scribe. 10/22/16

## 2016-10-22 NOTE — Progress Notes (Addendum)
CM faxed information regarding equipment and home health.  Equipment to be delivered to home between 1:00 to 5:00 pm family aware.  Talked with pharmacy regarding IV antibiotic patient to receive.  RN to be sent to some to get next dosage of Zosyn IV.  RN from home health scheduled to visit home 6:00 pm.  RN to contact family to make sure at home.  Per pharmacy IV pole not needed to administered medication.  Also patient and family also requested an order  for a glucometer.  Order for glucometer placed and faxed to [facility] to add for equipment delivery.

## 2016-10-22 NOTE — Progress Notes (Signed)
Spoke with pharmacy at [facility] regarding IV antibiotic.  The [facility] RN will be able to come to home on Sunday morning to administer the Zosyn dose.  Patient will receive the IV antibiotic here at the hospital for this evening.  Staff notified.

## 2016-10-22 NOTE — Progress Notes (Signed)
Pt's IV catheter clean dry and intact. Pt has received dose of IV Zosyn as requested by [place]. Discharge instructions including medications and follow up appointments were reviewed and discussed with patient's wife. [name] verbalized understanding of discharge instructions including medications and follow up appointments. Pt in stable condition and in no acute distress at time of discharge. Per physician patient will leave facility with IV catheter to receive IV vancomycin and zosyn from [facility]. Pt will be transported via [facility]

## 2016-10-23 DIAGNOSIS — N183 Chronic kidney disease, stage 3 (moderate): Secondary | ICD-10-CM | POA: Diagnosis not present

## 2016-10-23 DIAGNOSIS — Z5181 Encounter for therapeutic drug level monitoring: Secondary | ICD-10-CM | POA: Diagnosis not present

## 2016-10-23 DIAGNOSIS — G894 Chronic pain syndrome: Secondary | ICD-10-CM | POA: Diagnosis not present

## 2016-10-23 DIAGNOSIS — Z466 Encounter for fitting and adjustment of urinary device: Secondary | ICD-10-CM | POA: Diagnosis not present

## 2016-10-23 DIAGNOSIS — E1122 Type 2 diabetes mellitus with diabetic chronic kidney disease: Secondary | ICD-10-CM | POA: Diagnosis not present

## 2016-10-23 DIAGNOSIS — M069 Rheumatoid arthritis, unspecified: Secondary | ICD-10-CM | POA: Diagnosis not present

## 2016-10-23 DIAGNOSIS — Z452 Encounter for adjustment and management of vascular access device: Secondary | ICD-10-CM | POA: Diagnosis not present

## 2016-10-23 DIAGNOSIS — B965 Pseudomonas (aeruginosa) (mallei) (pseudomallei) as the cause of diseases classified elsewhere: Secondary | ICD-10-CM | POA: Diagnosis not present

## 2016-10-23 DIAGNOSIS — L89312 Pressure ulcer of right buttock, stage 2: Secondary | ICD-10-CM | POA: Diagnosis not present

## 2016-10-23 DIAGNOSIS — J90 Pleural effusion, not elsewhere classified: Secondary | ICD-10-CM | POA: Diagnosis not present

## 2016-10-23 DIAGNOSIS — A419 Sepsis, unspecified organism: Secondary | ICD-10-CM | POA: Diagnosis not present

## 2016-10-23 DIAGNOSIS — G40909 Epilepsy, unspecified, not intractable, without status epilepticus: Secondary | ICD-10-CM | POA: Diagnosis not present

## 2016-10-23 DIAGNOSIS — E1142 Type 2 diabetes mellitus with diabetic polyneuropathy: Secondary | ICD-10-CM | POA: Diagnosis not present

## 2016-10-23 DIAGNOSIS — J189 Pneumonia, unspecified organism: Secondary | ICD-10-CM | POA: Diagnosis not present

## 2016-10-23 DIAGNOSIS — L89890 Pressure ulcer of other site, unstageable: Secondary | ICD-10-CM | POA: Diagnosis not present

## 2016-10-23 DIAGNOSIS — T8781 Dehiscence of amputation stump: Secondary | ICD-10-CM | POA: Diagnosis not present

## 2016-10-23 DIAGNOSIS — M549 Dorsalgia, unspecified: Secondary | ICD-10-CM | POA: Diagnosis not present

## 2016-10-23 DIAGNOSIS — I5043 Acute on chronic combined systolic (congestive) and diastolic (congestive) heart failure: Secondary | ICD-10-CM | POA: Diagnosis not present

## 2016-10-23 DIAGNOSIS — Z7952 Long term (current) use of systemic steroids: Secondary | ICD-10-CM | POA: Diagnosis not present

## 2016-10-23 DIAGNOSIS — Z794 Long term (current) use of insulin: Secondary | ICD-10-CM | POA: Diagnosis not present

## 2016-10-23 DIAGNOSIS — N390 Urinary tract infection, site not specified: Secondary | ICD-10-CM | POA: Diagnosis not present

## 2016-10-23 DIAGNOSIS — I2510 Atherosclerotic heart disease of native coronary artery without angina pectoris: Secondary | ICD-10-CM | POA: Diagnosis not present

## 2016-10-24 DIAGNOSIS — J189 Pneumonia, unspecified organism: Secondary | ICD-10-CM | POA: Diagnosis not present

## 2016-10-24 DIAGNOSIS — B965 Pseudomonas (aeruginosa) (mallei) (pseudomallei) as the cause of diseases classified elsewhere: Secondary | ICD-10-CM | POA: Diagnosis not present

## 2016-10-24 DIAGNOSIS — L89312 Pressure ulcer of right buttock, stage 2: Secondary | ICD-10-CM | POA: Diagnosis not present

## 2016-10-24 DIAGNOSIS — N390 Urinary tract infection, site not specified: Secondary | ICD-10-CM | POA: Diagnosis not present

## 2016-10-24 DIAGNOSIS — A419 Sepsis, unspecified organism: Secondary | ICD-10-CM | POA: Diagnosis not present

## 2016-10-24 DIAGNOSIS — T8781 Dehiscence of amputation stump: Secondary | ICD-10-CM | POA: Diagnosis not present

## 2016-10-25 DIAGNOSIS — B965 Pseudomonas (aeruginosa) (mallei) (pseudomallei) as the cause of diseases classified elsewhere: Secondary | ICD-10-CM | POA: Diagnosis not present

## 2016-10-25 DIAGNOSIS — J189 Pneumonia, unspecified organism: Secondary | ICD-10-CM | POA: Diagnosis not present

## 2016-10-25 DIAGNOSIS — A419 Sepsis, unspecified organism: Secondary | ICD-10-CM | POA: Diagnosis not present

## 2016-10-25 DIAGNOSIS — N390 Urinary tract infection, site not specified: Secondary | ICD-10-CM | POA: Diagnosis not present

## 2016-10-25 DIAGNOSIS — T8781 Dehiscence of amputation stump: Secondary | ICD-10-CM | POA: Diagnosis not present

## 2016-10-25 DIAGNOSIS — L89312 Pressure ulcer of right buttock, stage 2: Secondary | ICD-10-CM | POA: Diagnosis not present

## 2016-10-26 DIAGNOSIS — A419 Sepsis, unspecified organism: Secondary | ICD-10-CM | POA: Diagnosis not present

## 2016-10-26 DIAGNOSIS — N390 Urinary tract infection, site not specified: Secondary | ICD-10-CM | POA: Diagnosis not present

## 2016-10-26 DIAGNOSIS — J189 Pneumonia, unspecified organism: Secondary | ICD-10-CM | POA: Diagnosis not present

## 2016-10-26 DIAGNOSIS — B965 Pseudomonas (aeruginosa) (mallei) (pseudomallei) as the cause of diseases classified elsewhere: Secondary | ICD-10-CM | POA: Diagnosis not present

## 2016-10-26 DIAGNOSIS — L89312 Pressure ulcer of right buttock, stage 2: Secondary | ICD-10-CM | POA: Diagnosis not present

## 2016-10-26 DIAGNOSIS — T8781 Dehiscence of amputation stump: Secondary | ICD-10-CM | POA: Diagnosis not present

## 2016-10-28 DIAGNOSIS — N390 Urinary tract infection, site not specified: Secondary | ICD-10-CM | POA: Diagnosis not present

## 2016-10-28 DIAGNOSIS — T8781 Dehiscence of amputation stump: Secondary | ICD-10-CM | POA: Diagnosis not present

## 2016-10-28 DIAGNOSIS — A419 Sepsis, unspecified organism: Secondary | ICD-10-CM | POA: Diagnosis not present

## 2016-10-28 DIAGNOSIS — J189 Pneumonia, unspecified organism: Secondary | ICD-10-CM | POA: Diagnosis not present

## 2016-10-28 DIAGNOSIS — B965 Pseudomonas (aeruginosa) (mallei) (pseudomallei) as the cause of diseases classified elsewhere: Secondary | ICD-10-CM | POA: Diagnosis not present

## 2016-10-28 DIAGNOSIS — L89312 Pressure ulcer of right buttock, stage 2: Secondary | ICD-10-CM | POA: Diagnosis not present

## 2016-10-31 DIAGNOSIS — L89312 Pressure ulcer of right buttock, stage 2: Secondary | ICD-10-CM | POA: Diagnosis not present

## 2016-10-31 DIAGNOSIS — Z794 Long term (current) use of insulin: Secondary | ICD-10-CM | POA: Diagnosis not present

## 2016-10-31 DIAGNOSIS — Z5181 Encounter for therapeutic drug level monitoring: Secondary | ICD-10-CM | POA: Diagnosis not present

## 2016-10-31 DIAGNOSIS — J189 Pneumonia, unspecified organism: Secondary | ICD-10-CM | POA: Diagnosis not present

## 2016-10-31 DIAGNOSIS — N183 Chronic kidney disease, stage 3 (moderate): Secondary | ICD-10-CM | POA: Diagnosis not present

## 2016-10-31 DIAGNOSIS — Z7952 Long term (current) use of systemic steroids: Secondary | ICD-10-CM | POA: Diagnosis not present

## 2016-10-31 DIAGNOSIS — N390 Urinary tract infection, site not specified: Secondary | ICD-10-CM | POA: Diagnosis not present

## 2016-10-31 DIAGNOSIS — G40909 Epilepsy, unspecified, not intractable, without status epilepticus: Secondary | ICD-10-CM | POA: Diagnosis not present

## 2016-10-31 DIAGNOSIS — T8781 Dehiscence of amputation stump: Secondary | ICD-10-CM | POA: Diagnosis not present

## 2016-10-31 DIAGNOSIS — J90 Pleural effusion, not elsewhere classified: Secondary | ICD-10-CM | POA: Diagnosis not present

## 2016-10-31 DIAGNOSIS — B965 Pseudomonas (aeruginosa) (mallei) (pseudomallei) as the cause of diseases classified elsewhere: Secondary | ICD-10-CM | POA: Diagnosis not present

## 2016-10-31 DIAGNOSIS — I5043 Acute on chronic combined systolic (congestive) and diastolic (congestive) heart failure: Secondary | ICD-10-CM | POA: Diagnosis not present

## 2016-10-31 DIAGNOSIS — A419 Sepsis, unspecified organism: Secondary | ICD-10-CM | POA: Diagnosis not present

## 2016-11-02 DIAGNOSIS — N390 Urinary tract infection, site not specified: Secondary | ICD-10-CM | POA: Diagnosis not present

## 2016-11-02 DIAGNOSIS — J189 Pneumonia, unspecified organism: Secondary | ICD-10-CM | POA: Diagnosis not present

## 2016-11-02 DIAGNOSIS — B965 Pseudomonas (aeruginosa) (mallei) (pseudomallei) as the cause of diseases classified elsewhere: Secondary | ICD-10-CM | POA: Diagnosis not present

## 2016-11-02 DIAGNOSIS — A419 Sepsis, unspecified organism: Secondary | ICD-10-CM | POA: Diagnosis not present

## 2016-11-02 DIAGNOSIS — L89312 Pressure ulcer of right buttock, stage 2: Secondary | ICD-10-CM | POA: Diagnosis not present

## 2016-11-02 DIAGNOSIS — T8781 Dehiscence of amputation stump: Secondary | ICD-10-CM | POA: Diagnosis not present

## 2016-11-03 DIAGNOSIS — L89312 Pressure ulcer of right buttock, stage 2: Secondary | ICD-10-CM | POA: Diagnosis not present

## 2016-11-03 DIAGNOSIS — N390 Urinary tract infection, site not specified: Secondary | ICD-10-CM | POA: Diagnosis not present

## 2016-11-03 DIAGNOSIS — T8781 Dehiscence of amputation stump: Secondary | ICD-10-CM | POA: Diagnosis not present

## 2016-11-03 DIAGNOSIS — A419 Sepsis, unspecified organism: Secondary | ICD-10-CM | POA: Diagnosis not present

## 2016-11-03 DIAGNOSIS — J189 Pneumonia, unspecified organism: Secondary | ICD-10-CM | POA: Diagnosis not present

## 2016-11-03 DIAGNOSIS — B965 Pseudomonas (aeruginosa) (mallei) (pseudomallei) as the cause of diseases classified elsewhere: Secondary | ICD-10-CM | POA: Diagnosis not present

## 2016-11-04 DIAGNOSIS — N182 Chronic kidney disease, stage 2 (mild): Secondary | ICD-10-CM | POA: Diagnosis not present

## 2016-11-04 DIAGNOSIS — D638 Anemia in other chronic diseases classified elsewhere: Secondary | ICD-10-CM | POA: Diagnosis not present

## 2016-11-04 DIAGNOSIS — E1129 Type 2 diabetes mellitus with other diabetic kidney complication: Secondary | ICD-10-CM | POA: Diagnosis not present

## 2016-11-07 DIAGNOSIS — N390 Urinary tract infection, site not specified: Secondary | ICD-10-CM | POA: Diagnosis not present

## 2016-11-07 DIAGNOSIS — A419 Sepsis, unspecified organism: Secondary | ICD-10-CM | POA: Diagnosis not present

## 2016-11-07 DIAGNOSIS — J189 Pneumonia, unspecified organism: Secondary | ICD-10-CM | POA: Diagnosis not present

## 2016-11-07 DIAGNOSIS — T8781 Dehiscence of amputation stump: Secondary | ICD-10-CM | POA: Diagnosis not present

## 2016-11-07 DIAGNOSIS — B965 Pseudomonas (aeruginosa) (mallei) (pseudomallei) as the cause of diseases classified elsewhere: Secondary | ICD-10-CM | POA: Diagnosis not present

## 2016-11-07 DIAGNOSIS — L89312 Pressure ulcer of right buttock, stage 2: Secondary | ICD-10-CM | POA: Diagnosis not present

## 2016-11-08 DIAGNOSIS — A419 Sepsis, unspecified organism: Secondary | ICD-10-CM | POA: Diagnosis not present

## 2016-11-08 DIAGNOSIS — N390 Urinary tract infection, site not specified: Secondary | ICD-10-CM | POA: Diagnosis not present

## 2016-11-08 DIAGNOSIS — B965 Pseudomonas (aeruginosa) (mallei) (pseudomallei) as the cause of diseases classified elsewhere: Secondary | ICD-10-CM | POA: Diagnosis not present

## 2016-11-08 DIAGNOSIS — T8781 Dehiscence of amputation stump: Secondary | ICD-10-CM | POA: Diagnosis not present

## 2016-11-08 DIAGNOSIS — L89312 Pressure ulcer of right buttock, stage 2: Secondary | ICD-10-CM | POA: Diagnosis not present

## 2016-11-08 DIAGNOSIS — J189 Pneumonia, unspecified organism: Secondary | ICD-10-CM | POA: Diagnosis not present

## 2016-11-09 DIAGNOSIS — J189 Pneumonia, unspecified organism: Secondary | ICD-10-CM | POA: Diagnosis not present

## 2016-11-09 DIAGNOSIS — B965 Pseudomonas (aeruginosa) (mallei) (pseudomallei) as the cause of diseases classified elsewhere: Secondary | ICD-10-CM | POA: Diagnosis not present

## 2016-11-09 DIAGNOSIS — L89312 Pressure ulcer of right buttock, stage 2: Secondary | ICD-10-CM | POA: Diagnosis not present

## 2016-11-09 DIAGNOSIS — A419 Sepsis, unspecified organism: Secondary | ICD-10-CM | POA: Diagnosis not present

## 2016-11-09 DIAGNOSIS — T8781 Dehiscence of amputation stump: Secondary | ICD-10-CM | POA: Diagnosis not present

## 2016-11-09 DIAGNOSIS — N390 Urinary tract infection, site not specified: Secondary | ICD-10-CM | POA: Diagnosis not present

## 2016-11-10 DIAGNOSIS — J189 Pneumonia, unspecified organism: Secondary | ICD-10-CM | POA: Diagnosis not present

## 2016-11-10 DIAGNOSIS — B965 Pseudomonas (aeruginosa) (mallei) (pseudomallei) as the cause of diseases classified elsewhere: Secondary | ICD-10-CM | POA: Diagnosis not present

## 2016-11-10 DIAGNOSIS — N390 Urinary tract infection, site not specified: Secondary | ICD-10-CM | POA: Diagnosis not present

## 2016-11-10 DIAGNOSIS — T8781 Dehiscence of amputation stump: Secondary | ICD-10-CM | POA: Diagnosis not present

## 2016-11-10 DIAGNOSIS — A419 Sepsis, unspecified organism: Secondary | ICD-10-CM | POA: Diagnosis not present

## 2016-11-10 DIAGNOSIS — L89312 Pressure ulcer of right buttock, stage 2: Secondary | ICD-10-CM | POA: Diagnosis not present

## 2016-11-11 ENCOUNTER — Ambulatory Visit (INDEPENDENT_AMBULATORY_CARE_PROVIDER_SITE_OTHER): Payer: Medicare Other | Admitting: Family Medicine

## 2016-11-11 ENCOUNTER — Encounter: Payer: Self-pay | Admitting: Family Medicine

## 2016-11-11 VITALS — BP 112/72 | Ht 68.0 in | Wt 160.0 lb

## 2016-11-11 DIAGNOSIS — IMO0001 Reserved for inherently not codable concepts without codable children: Secondary | ICD-10-CM

## 2016-11-11 DIAGNOSIS — E119 Type 2 diabetes mellitus without complications: Secondary | ICD-10-CM

## 2016-11-11 DIAGNOSIS — I10 Essential (primary) hypertension: Secondary | ICD-10-CM

## 2016-11-11 DIAGNOSIS — Z794 Long term (current) use of insulin: Secondary | ICD-10-CM | POA: Diagnosis not present

## 2016-11-11 DIAGNOSIS — M069 Rheumatoid arthritis, unspecified: Secondary | ICD-10-CM | POA: Diagnosis not present

## 2016-11-11 DIAGNOSIS — I2510 Atherosclerotic heart disease of native coronary artery without angina pectoris: Secondary | ICD-10-CM | POA: Diagnosis not present

## 2016-11-11 DIAGNOSIS — I5020 Unspecified systolic (congestive) heart failure: Secondary | ICD-10-CM

## 2016-11-11 MED ORDER — TAMSULOSIN HCL 0.4 MG PO CAPS: 0.4000 mg | ORAL_CAPSULE | Freq: Every day | ORAL | 5 refills | Status: DC

## 2016-11-11 MED ORDER — TORSEMIDE 10 MG PO TABS: ORAL_TABLET | ORAL | 5 refills | Status: DC

## 2016-11-11 MED ORDER — METOPROLOL SUCCINATE ER 25 MG PO TB24: 37.5000 mg | ORAL_TABLET | Freq: Every morning | ORAL | 5 refills | Status: DC

## 2016-11-11 MED ORDER — AMITRIPTYLINE HCL 50 MG PO TABS: 75.0000 mg | ORAL_TABLET | Freq: Every day | ORAL | 6 refills | Status: DC

## 2016-11-11 MED ORDER — PREGABALIN 25 MG PO CAPS: ORAL_CAPSULE | ORAL | 5 refills | Status: DC

## 2016-11-11 MED ORDER — ATORVASTATIN CALCIUM 80 MG PO TABS: 80.0000 mg | ORAL_TABLET | Freq: Every day | ORAL | 5 refills | Status: DC

## 2016-11-11 MED ORDER — PREGABALIN 25 MG PO CAPS: ORAL_CAPSULE | ORAL | 0 refills | Status: DC

## 2016-11-11 NOTE — Progress Notes (Signed)
   Subjective:    Patient ID: [name], male    DOB: [birth year], 65 y.o.   MRN: [id number] Patient is an enormously complicated patient who arrives to the office as a first-time patient. HPI Patient arrives to be established as a new patient. [facility] with [facility]. In fact, most recent discharge was directly from [facility]. Was in the hospital with catheter related sepsis. Received a week's worth of antibiotic therapy. In this regard states things are certainly better.  Extensive review of the chart reveals a couple prior admissions earlier in the past several months for exacerbation of congestive heart failure.  Earlier in the year patient had bypass surgery via [facility]. He had a very protracted hospitalization. 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  Patient has had difficulty with healing of this amputation site. Initially skilled nursing and now Home health nurses along with family packing wound daily. Seen by physical therapist and nurse  Long-standing history of rheumatoid arthritis. Has rheumatologist in [place]. Currently the rheumatologist provides care for his rheumatoid arthritis along with 4 times a day methadone supplementation. Family is wondering if he can qualify for a local rheumatologist to provide the same services. 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 Claims compliance with medications. Reports sugars have started to improve on current regimen. Claims compliance with meds  Would like to have a closer cardiologist. First angioplasty occurred greater than 20 years ago. Has noted congestive heart failure with multiple admissions this year. Also bypass surgery lately. Dr Berenda Morale [place] Review of Systems Positive fatigue, slight constipation, shortness of breath with exertion  but not at rest. No chest pain or abdominal pain. Diet has improved.    Objective:   Physical Exam Alert vitals stable in a wheelchair. No acute distress but chronically ill appearing. Blood pressure good on repeat. HEENT normal. Lungs basilar crackles no tachypnea heart regular in rhythm abdomen benign left leg inspected stump reveals open wound packed with saline. Right foot diffuse neurosensory loss with diminished arterial pulses       Assessment & Plan:  Impression 1 status post below the knee amputation with residual ulcer wound care discussed #2 recent admissions for congestive heart failure. Parameters avoiding this discussed. Compliance with medication discussed. #3 type 2 diabetes insulin requiring. Sugars suboptimum until very lately on current regimen #4 status post multiple angioplasties and cardiac surgery bypass #5 chronic renal failure. Stage III. Currently seen Dr. the in the community #6 generalized weakness slowly improving #7 chronic methadone usage for pain control primarily of rheumatoid arthritis plan we will work on cardiology referral. I am not optimistic about finding a rheumatologist for rheumatoid arthritis management along with 4 times a day methadone. Patient to maintain same medications for now. Of note no longer using oxygen at home. Diet exercise warning signs discussed. One full hour spent with the patient and family and chart in discussion primarily in review of all symptoms history medications status etc. and discussion are short to medium term plans

## 2016-11-14 DIAGNOSIS — J189 Pneumonia, unspecified organism: Secondary | ICD-10-CM | POA: Diagnosis not present

## 2016-11-14 DIAGNOSIS — T8781 Dehiscence of amputation stump: Secondary | ICD-10-CM | POA: Diagnosis not present

## 2016-11-14 DIAGNOSIS — B965 Pseudomonas (aeruginosa) (mallei) (pseudomallei) as the cause of diseases classified elsewhere: Secondary | ICD-10-CM | POA: Diagnosis not present

## 2016-11-14 DIAGNOSIS — L89312 Pressure ulcer of right buttock, stage 2: Secondary | ICD-10-CM | POA: Diagnosis not present

## 2016-11-14 DIAGNOSIS — A419 Sepsis, unspecified organism: Secondary | ICD-10-CM | POA: Diagnosis not present

## 2016-11-14 DIAGNOSIS — N390 Urinary tract infection, site not specified: Secondary | ICD-10-CM | POA: Diagnosis not present

## 2016-11-15 DIAGNOSIS — N390 Urinary tract infection, site not specified: Secondary | ICD-10-CM | POA: Diagnosis not present

## 2016-11-15 DIAGNOSIS — A419 Sepsis, unspecified organism: Secondary | ICD-10-CM | POA: Diagnosis not present

## 2016-11-15 DIAGNOSIS — T8781 Dehiscence of amputation stump: Secondary | ICD-10-CM | POA: Diagnosis not present

## 2016-11-15 DIAGNOSIS — L89312 Pressure ulcer of right buttock, stage 2: Secondary | ICD-10-CM | POA: Diagnosis not present

## 2016-11-15 DIAGNOSIS — J189 Pneumonia, unspecified organism: Secondary | ICD-10-CM | POA: Diagnosis not present

## 2016-11-15 DIAGNOSIS — B965 Pseudomonas (aeruginosa) (mallei) (pseudomallei) as the cause of diseases classified elsewhere: Secondary | ICD-10-CM | POA: Diagnosis not present

## 2016-11-16 ENCOUNTER — Ambulatory Visit (INDEPENDENT_AMBULATORY_CARE_PROVIDER_SITE_OTHER): Payer: Medicare Other | Admitting: Urology

## 2016-11-16 DIAGNOSIS — R338 Other retention of urine: Secondary | ICD-10-CM | POA: Diagnosis not present

## 2016-11-18 DIAGNOSIS — B965 Pseudomonas (aeruginosa) (mallei) (pseudomallei) as the cause of diseases classified elsewhere: Secondary | ICD-10-CM | POA: Diagnosis not present

## 2016-11-18 DIAGNOSIS — J189 Pneumonia, unspecified organism: Secondary | ICD-10-CM | POA: Diagnosis not present

## 2016-11-18 DIAGNOSIS — N390 Urinary tract infection, site not specified: Secondary | ICD-10-CM | POA: Diagnosis not present

## 2016-11-18 DIAGNOSIS — T8781 Dehiscence of amputation stump: Secondary | ICD-10-CM | POA: Diagnosis not present

## 2016-11-18 DIAGNOSIS — L89312 Pressure ulcer of right buttock, stage 2: Secondary | ICD-10-CM | POA: Diagnosis not present

## 2016-11-18 DIAGNOSIS — A419 Sepsis, unspecified organism: Secondary | ICD-10-CM | POA: Diagnosis not present

## 2016-11-22 ENCOUNTER — Encounter: Payer: Self-pay | Admitting: Family Medicine

## 2016-11-22 DIAGNOSIS — B965 Pseudomonas (aeruginosa) (mallei) (pseudomallei) as the cause of diseases classified elsewhere: Secondary | ICD-10-CM | POA: Diagnosis not present

## 2016-11-22 DIAGNOSIS — A419 Sepsis, unspecified organism: Secondary | ICD-10-CM | POA: Diagnosis not present

## 2016-11-22 DIAGNOSIS — T8781 Dehiscence of amputation stump: Secondary | ICD-10-CM | POA: Diagnosis not present

## 2016-11-22 DIAGNOSIS — L89312 Pressure ulcer of right buttock, stage 2: Secondary | ICD-10-CM | POA: Diagnosis not present

## 2016-11-22 DIAGNOSIS — N390 Urinary tract infection, site not specified: Secondary | ICD-10-CM | POA: Diagnosis not present

## 2016-11-22 DIAGNOSIS — J189 Pneumonia, unspecified organism: Secondary | ICD-10-CM | POA: Diagnosis not present

## 2016-11-23 ENCOUNTER — Telehealth: Payer: Self-pay | Admitting: Family Medicine

## 2016-11-23 DIAGNOSIS — B965 Pseudomonas (aeruginosa) (mallei) (pseudomallei) as the cause of diseases classified elsewhere: Secondary | ICD-10-CM | POA: Diagnosis not present

## 2016-11-23 DIAGNOSIS — L89312 Pressure ulcer of right buttock, stage 2: Secondary | ICD-10-CM | POA: Diagnosis not present

## 2016-11-23 DIAGNOSIS — S81802A Unspecified open wound, left lower leg, initial encounter: Secondary | ICD-10-CM

## 2016-11-23 DIAGNOSIS — N390 Urinary tract infection, site not specified: Secondary | ICD-10-CM | POA: Diagnosis not present

## 2016-11-23 DIAGNOSIS — A419 Sepsis, unspecified organism: Secondary | ICD-10-CM | POA: Diagnosis not present

## 2016-11-23 DIAGNOSIS — T8781 Dehiscence of amputation stump: Secondary | ICD-10-CM | POA: Diagnosis not present

## 2016-11-23 DIAGNOSIS — J189 Pneumonia, unspecified organism: Secondary | ICD-10-CM | POA: Diagnosis not present

## 2016-11-23 NOTE — Telephone Encounter (Signed)
Ok lets do 

## 2016-11-23 NOTE — Telephone Encounter (Signed)
[facility]-(Liz) called about patient wounds on left leg not healing. She wants a referral to wound center [place]. [phone]

## 2016-11-23 NOTE — Telephone Encounter (Signed)
Dr. Steve to see 

## 2016-11-24 DIAGNOSIS — A419 Sepsis, unspecified organism: Secondary | ICD-10-CM | POA: Diagnosis not present

## 2016-11-24 DIAGNOSIS — B965 Pseudomonas (aeruginosa) (mallei) (pseudomallei) as the cause of diseases classified elsewhere: Secondary | ICD-10-CM | POA: Diagnosis not present

## 2016-11-24 DIAGNOSIS — N390 Urinary tract infection, site not specified: Secondary | ICD-10-CM | POA: Diagnosis not present

## 2016-11-24 DIAGNOSIS — L89312 Pressure ulcer of right buttock, stage 2: Secondary | ICD-10-CM | POA: Diagnosis not present

## 2016-11-24 DIAGNOSIS — T8781 Dehiscence of amputation stump: Secondary | ICD-10-CM | POA: Diagnosis not present

## 2016-11-24 DIAGNOSIS — J189 Pneumonia, unspecified organism: Secondary | ICD-10-CM | POA: Diagnosis not present

## 2016-11-24 NOTE — Telephone Encounter (Signed)
Left message return call 11/24/16 

## 2016-11-25 DIAGNOSIS — T8781 Dehiscence of amputation stump: Secondary | ICD-10-CM | POA: Diagnosis not present

## 2016-11-25 DIAGNOSIS — N390 Urinary tract infection, site not specified: Secondary | ICD-10-CM | POA: Diagnosis not present

## 2016-11-25 DIAGNOSIS — L89312 Pressure ulcer of right buttock, stage 2: Secondary | ICD-10-CM | POA: Diagnosis not present

## 2016-11-25 DIAGNOSIS — B965 Pseudomonas (aeruginosa) (mallei) (pseudomallei) as the cause of diseases classified elsewhere: Secondary | ICD-10-CM | POA: Diagnosis not present

## 2016-11-25 DIAGNOSIS — J189 Pneumonia, unspecified organism: Secondary | ICD-10-CM | POA: Diagnosis not present

## 2016-11-25 DIAGNOSIS — A419 Sepsis, unspecified organism: Secondary | ICD-10-CM | POA: Diagnosis not present

## 2016-11-28 NOTE — Addendum Note (Signed)
Addended by: Ofilia Neas R on: 11/28/2016 04:24 PM   Modules accepted: Orders

## 2016-11-28 NOTE — Telephone Encounter (Signed)
Left message return call 11/28/16

## 2016-11-29 DIAGNOSIS — A419 Sepsis, unspecified organism: Secondary | ICD-10-CM | POA: Diagnosis not present

## 2016-11-29 DIAGNOSIS — B965 Pseudomonas (aeruginosa) (mallei) (pseudomallei) as the cause of diseases classified elsewhere: Secondary | ICD-10-CM | POA: Diagnosis not present

## 2016-11-29 DIAGNOSIS — T8781 Dehiscence of amputation stump: Secondary | ICD-10-CM | POA: Diagnosis not present

## 2016-11-29 DIAGNOSIS — J189 Pneumonia, unspecified organism: Secondary | ICD-10-CM | POA: Diagnosis not present

## 2016-11-29 DIAGNOSIS — L89312 Pressure ulcer of right buttock, stage 2: Secondary | ICD-10-CM | POA: Diagnosis not present

## 2016-11-29 DIAGNOSIS — N390 Urinary tract infection, site not specified: Secondary | ICD-10-CM | POA: Diagnosis not present

## 2016-11-29 NOTE — Telephone Encounter (Signed)
Referral was ordered in [facility] and home health nurse notified.

## 2016-11-30 ENCOUNTER — Encounter: Payer: Self-pay | Admitting: Family Medicine

## 2016-11-30 DIAGNOSIS — J189 Pneumonia, unspecified organism: Secondary | ICD-10-CM | POA: Diagnosis not present

## 2016-11-30 DIAGNOSIS — T8781 Dehiscence of amputation stump: Secondary | ICD-10-CM | POA: Diagnosis not present

## 2016-11-30 DIAGNOSIS — L89312 Pressure ulcer of right buttock, stage 2: Secondary | ICD-10-CM | POA: Diagnosis not present

## 2016-11-30 DIAGNOSIS — N390 Urinary tract infection, site not specified: Secondary | ICD-10-CM | POA: Diagnosis not present

## 2016-11-30 DIAGNOSIS — B965 Pseudomonas (aeruginosa) (mallei) (pseudomallei) as the cause of diseases classified elsewhere: Secondary | ICD-10-CM | POA: Diagnosis not present

## 2016-11-30 DIAGNOSIS — A419 Sepsis, unspecified organism: Secondary | ICD-10-CM | POA: Diagnosis not present

## 2016-12-05 DIAGNOSIS — A419 Sepsis, unspecified organism: Secondary | ICD-10-CM | POA: Diagnosis not present

## 2016-12-05 DIAGNOSIS — J189 Pneumonia, unspecified organism: Secondary | ICD-10-CM | POA: Diagnosis not present

## 2016-12-05 DIAGNOSIS — N390 Urinary tract infection, site not specified: Secondary | ICD-10-CM | POA: Diagnosis not present

## 2016-12-05 DIAGNOSIS — T8781 Dehiscence of amputation stump: Secondary | ICD-10-CM | POA: Diagnosis not present

## 2016-12-05 DIAGNOSIS — L89312 Pressure ulcer of right buttock, stage 2: Secondary | ICD-10-CM | POA: Diagnosis not present

## 2016-12-05 DIAGNOSIS — B965 Pseudomonas (aeruginosa) (mallei) (pseudomallei) as the cause of diseases classified elsewhere: Secondary | ICD-10-CM | POA: Diagnosis not present

## 2016-12-06 ENCOUNTER — Telehealth: Payer: Self-pay | Admitting: Family Medicine

## 2016-12-06 DIAGNOSIS — N390 Urinary tract infection, site not specified: Secondary | ICD-10-CM | POA: Diagnosis not present

## 2016-12-06 DIAGNOSIS — B965 Pseudomonas (aeruginosa) (mallei) (pseudomallei) as the cause of diseases classified elsewhere: Secondary | ICD-10-CM | POA: Diagnosis not present

## 2016-12-06 DIAGNOSIS — J189 Pneumonia, unspecified organism: Secondary | ICD-10-CM | POA: Diagnosis not present

## 2016-12-06 DIAGNOSIS — T8781 Dehiscence of amputation stump: Secondary | ICD-10-CM | POA: Diagnosis not present

## 2016-12-06 DIAGNOSIS — A419 Sepsis, unspecified organism: Secondary | ICD-10-CM | POA: Diagnosis not present

## 2016-12-06 DIAGNOSIS — L89312 Pressure ulcer of right buttock, stage 2: Secondary | ICD-10-CM | POA: Diagnosis not present

## 2016-12-06 NOTE — Telephone Encounter (Signed)
ok 

## 2016-12-06 NOTE — Telephone Encounter (Signed)
Patients spouse wanted to inform Dr. Richardson Landry that [name] appointment with Dr. Nils Pyle at the [facility] has been changed appointment to January 10 @ 8:30.

## 2016-12-07 DIAGNOSIS — T8781 Dehiscence of amputation stump: Secondary | ICD-10-CM | POA: Diagnosis not present

## 2016-12-07 DIAGNOSIS — L89312 Pressure ulcer of right buttock, stage 2: Secondary | ICD-10-CM | POA: Diagnosis not present

## 2016-12-07 DIAGNOSIS — N390 Urinary tract infection, site not specified: Secondary | ICD-10-CM | POA: Diagnosis not present

## 2016-12-07 DIAGNOSIS — A419 Sepsis, unspecified organism: Secondary | ICD-10-CM | POA: Diagnosis not present

## 2016-12-07 DIAGNOSIS — J189 Pneumonia, unspecified organism: Secondary | ICD-10-CM | POA: Diagnosis not present

## 2016-12-07 DIAGNOSIS — B965 Pseudomonas (aeruginosa) (mallei) (pseudomallei) as the cause of diseases classified elsewhere: Secondary | ICD-10-CM | POA: Diagnosis not present

## 2016-12-09 DIAGNOSIS — T8781 Dehiscence of amputation stump: Secondary | ICD-10-CM | POA: Diagnosis not present

## 2016-12-09 DIAGNOSIS — L89312 Pressure ulcer of right buttock, stage 2: Secondary | ICD-10-CM | POA: Diagnosis not present

## 2016-12-09 DIAGNOSIS — J189 Pneumonia, unspecified organism: Secondary | ICD-10-CM | POA: Diagnosis not present

## 2016-12-09 DIAGNOSIS — B965 Pseudomonas (aeruginosa) (mallei) (pseudomallei) as the cause of diseases classified elsewhere: Secondary | ICD-10-CM | POA: Diagnosis not present

## 2016-12-09 DIAGNOSIS — N390 Urinary tract infection, site not specified: Secondary | ICD-10-CM | POA: Diagnosis not present

## 2016-12-09 DIAGNOSIS — A419 Sepsis, unspecified organism: Secondary | ICD-10-CM | POA: Diagnosis not present

## 2016-12-12 DIAGNOSIS — T8781 Dehiscence of amputation stump: Secondary | ICD-10-CM | POA: Diagnosis not present

## 2016-12-12 DIAGNOSIS — N390 Urinary tract infection, site not specified: Secondary | ICD-10-CM | POA: Diagnosis not present

## 2016-12-12 DIAGNOSIS — A419 Sepsis, unspecified organism: Secondary | ICD-10-CM | POA: Diagnosis not present

## 2016-12-12 DIAGNOSIS — B965 Pseudomonas (aeruginosa) (mallei) (pseudomallei) as the cause of diseases classified elsewhere: Secondary | ICD-10-CM | POA: Diagnosis not present

## 2016-12-12 DIAGNOSIS — J189 Pneumonia, unspecified organism: Secondary | ICD-10-CM | POA: Diagnosis not present

## 2016-12-12 DIAGNOSIS — L89312 Pressure ulcer of right buttock, stage 2: Secondary | ICD-10-CM | POA: Diagnosis not present

## 2016-12-14 DIAGNOSIS — B965 Pseudomonas (aeruginosa) (mallei) (pseudomallei) as the cause of diseases classified elsewhere: Secondary | ICD-10-CM | POA: Diagnosis not present

## 2016-12-14 DIAGNOSIS — N390 Urinary tract infection, site not specified: Secondary | ICD-10-CM | POA: Diagnosis not present

## 2016-12-14 DIAGNOSIS — T8781 Dehiscence of amputation stump: Secondary | ICD-10-CM | POA: Diagnosis not present

## 2016-12-14 DIAGNOSIS — J189 Pneumonia, unspecified organism: Secondary | ICD-10-CM | POA: Diagnosis not present

## 2016-12-14 DIAGNOSIS — A419 Sepsis, unspecified organism: Secondary | ICD-10-CM | POA: Diagnosis not present

## 2016-12-14 DIAGNOSIS — L89312 Pressure ulcer of right buttock, stage 2: Secondary | ICD-10-CM | POA: Diagnosis not present

## 2016-12-15 ENCOUNTER — Telehealth: Payer: Self-pay | Admitting: Family Medicine

## 2016-12-15 DIAGNOSIS — N390 Urinary tract infection, site not specified: Secondary | ICD-10-CM | POA: Diagnosis not present

## 2016-12-15 DIAGNOSIS — L89312 Pressure ulcer of right buttock, stage 2: Secondary | ICD-10-CM | POA: Diagnosis not present

## 2016-12-15 DIAGNOSIS — B965 Pseudomonas (aeruginosa) (mallei) (pseudomallei) as the cause of diseases classified elsewhere: Secondary | ICD-10-CM | POA: Diagnosis not present

## 2016-12-15 DIAGNOSIS — A419 Sepsis, unspecified organism: Secondary | ICD-10-CM | POA: Diagnosis not present

## 2016-12-15 DIAGNOSIS — T8781 Dehiscence of amputation stump: Secondary | ICD-10-CM | POA: Diagnosis not present

## 2016-12-15 DIAGNOSIS — J189 Pneumonia, unspecified organism: Secondary | ICD-10-CM | POA: Diagnosis not present

## 2016-12-15 NOTE — Telephone Encounter (Signed)
Verbal order given to Home health nurse

## 2016-12-15 NOTE — Telephone Encounter (Signed)
Ok to the nurse, hapt will be going to wound center shortly I believe

## 2016-12-15 NOTE — Telephone Encounter (Signed)
Patient has wounds on his left leg where he had his amputation.  He has one particular wound that has black tissue which is falling off. Kathlee Nations, nurse at [facility], would like to do wet to dry dressing, change daily.

## 2016-12-16 ENCOUNTER — Ambulatory Visit: Payer: Medicare Other | Admitting: Family Medicine

## 2016-12-20 DIAGNOSIS — B965 Pseudomonas (aeruginosa) (mallei) (pseudomallei) as the cause of diseases classified elsewhere: Secondary | ICD-10-CM | POA: Diagnosis not present

## 2016-12-20 DIAGNOSIS — A419 Sepsis, unspecified organism: Secondary | ICD-10-CM | POA: Diagnosis not present

## 2016-12-20 DIAGNOSIS — T8781 Dehiscence of amputation stump: Secondary | ICD-10-CM | POA: Diagnosis not present

## 2016-12-20 DIAGNOSIS — L89312 Pressure ulcer of right buttock, stage 2: Secondary | ICD-10-CM | POA: Diagnosis not present

## 2016-12-20 DIAGNOSIS — N390 Urinary tract infection, site not specified: Secondary | ICD-10-CM | POA: Diagnosis not present

## 2016-12-20 DIAGNOSIS — J189 Pneumonia, unspecified organism: Secondary | ICD-10-CM | POA: Diagnosis not present

## 2016-12-21 ENCOUNTER — Ambulatory Visit: Payer: Medicare Other | Admitting: Urology

## 2016-12-22 DIAGNOSIS — Z960 Presence of urogenital implants: Secondary | ICD-10-CM | POA: Diagnosis not present

## 2016-12-22 DIAGNOSIS — Z794 Long term (current) use of insulin: Secondary | ICD-10-CM | POA: Diagnosis not present

## 2016-12-22 DIAGNOSIS — Z5181 Encounter for therapeutic drug level monitoring: Secondary | ICD-10-CM | POA: Diagnosis not present

## 2016-12-22 DIAGNOSIS — J90 Pleural effusion, not elsewhere classified: Secondary | ICD-10-CM | POA: Diagnosis not present

## 2016-12-22 DIAGNOSIS — M549 Dorsalgia, unspecified: Secondary | ICD-10-CM | POA: Diagnosis not present

## 2016-12-22 DIAGNOSIS — E1142 Type 2 diabetes mellitus with diabetic polyneuropathy: Secondary | ICD-10-CM | POA: Diagnosis not present

## 2016-12-22 DIAGNOSIS — I130 Hypertensive heart and chronic kidney disease with heart failure and stage 1 through stage 4 chronic kidney disease, or unspecified chronic kidney disease: Secondary | ICD-10-CM | POA: Diagnosis not present

## 2016-12-22 DIAGNOSIS — I5042 Chronic combined systolic (congestive) and diastolic (congestive) heart failure: Secondary | ICD-10-CM | POA: Diagnosis not present

## 2016-12-22 DIAGNOSIS — L89890 Pressure ulcer of other site, unstageable: Secondary | ICD-10-CM | POA: Diagnosis not present

## 2016-12-22 DIAGNOSIS — G894 Chronic pain syndrome: Secondary | ICD-10-CM | POA: Diagnosis not present

## 2016-12-22 DIAGNOSIS — I2510 Atherosclerotic heart disease of native coronary artery without angina pectoris: Secondary | ICD-10-CM | POA: Diagnosis not present

## 2016-12-22 DIAGNOSIS — G40909 Epilepsy, unspecified, not intractable, without status epilepticus: Secondary | ICD-10-CM | POA: Diagnosis not present

## 2016-12-22 DIAGNOSIS — Z7952 Long term (current) use of systemic steroids: Secondary | ICD-10-CM | POA: Diagnosis not present

## 2016-12-22 DIAGNOSIS — T8781 Dehiscence of amputation stump: Secondary | ICD-10-CM | POA: Diagnosis not present

## 2016-12-22 DIAGNOSIS — N183 Chronic kidney disease, stage 3 (moderate): Secondary | ICD-10-CM | POA: Diagnosis not present

## 2016-12-22 DIAGNOSIS — E1122 Type 2 diabetes mellitus with diabetic chronic kidney disease: Secondary | ICD-10-CM | POA: Diagnosis not present

## 2016-12-22 DIAGNOSIS — M069 Rheumatoid arthritis, unspecified: Secondary | ICD-10-CM | POA: Diagnosis not present

## 2016-12-23 ENCOUNTER — Ambulatory Visit: Payer: Medicare Other | Admitting: Internal Medicine

## 2016-12-23 DIAGNOSIS — E1122 Type 2 diabetes mellitus with diabetic chronic kidney disease: Secondary | ICD-10-CM | POA: Diagnosis not present

## 2016-12-23 DIAGNOSIS — N183 Chronic kidney disease, stage 3 (moderate): Secondary | ICD-10-CM | POA: Diagnosis not present

## 2016-12-23 DIAGNOSIS — E1142 Type 2 diabetes mellitus with diabetic polyneuropathy: Secondary | ICD-10-CM | POA: Diagnosis not present

## 2016-12-23 DIAGNOSIS — I130 Hypertensive heart and chronic kidney disease with heart failure and stage 1 through stage 4 chronic kidney disease, or unspecified chronic kidney disease: Secondary | ICD-10-CM | POA: Diagnosis not present

## 2016-12-23 DIAGNOSIS — I5042 Chronic combined systolic (congestive) and diastolic (congestive) heart failure: Secondary | ICD-10-CM | POA: Diagnosis not present

## 2016-12-23 DIAGNOSIS — T8781 Dehiscence of amputation stump: Secondary | ICD-10-CM | POA: Diagnosis not present

## 2016-12-27 DIAGNOSIS — E1142 Type 2 diabetes mellitus with diabetic polyneuropathy: Secondary | ICD-10-CM | POA: Diagnosis not present

## 2016-12-27 DIAGNOSIS — N183 Chronic kidney disease, stage 3 (moderate): Secondary | ICD-10-CM | POA: Diagnosis not present

## 2016-12-27 DIAGNOSIS — I5042 Chronic combined systolic (congestive) and diastolic (congestive) heart failure: Secondary | ICD-10-CM | POA: Diagnosis not present

## 2016-12-27 DIAGNOSIS — T8781 Dehiscence of amputation stump: Secondary | ICD-10-CM | POA: Diagnosis not present

## 2016-12-27 DIAGNOSIS — I130 Hypertensive heart and chronic kidney disease with heart failure and stage 1 through stage 4 chronic kidney disease, or unspecified chronic kidney disease: Secondary | ICD-10-CM | POA: Diagnosis not present

## 2016-12-27 DIAGNOSIS — E1122 Type 2 diabetes mellitus with diabetic chronic kidney disease: Secondary | ICD-10-CM | POA: Diagnosis not present

## 2016-12-29 DIAGNOSIS — E1122 Type 2 diabetes mellitus with diabetic chronic kidney disease: Secondary | ICD-10-CM | POA: Diagnosis not present

## 2016-12-29 DIAGNOSIS — I5042 Chronic combined systolic (congestive) and diastolic (congestive) heart failure: Secondary | ICD-10-CM | POA: Diagnosis not present

## 2016-12-29 DIAGNOSIS — I130 Hypertensive heart and chronic kidney disease with heart failure and stage 1 through stage 4 chronic kidney disease, or unspecified chronic kidney disease: Secondary | ICD-10-CM | POA: Diagnosis not present

## 2016-12-29 DIAGNOSIS — N183 Chronic kidney disease, stage 3 (moderate): Secondary | ICD-10-CM | POA: Diagnosis not present

## 2016-12-29 DIAGNOSIS — T8781 Dehiscence of amputation stump: Secondary | ICD-10-CM | POA: Diagnosis not present

## 2016-12-29 DIAGNOSIS — E1142 Type 2 diabetes mellitus with diabetic polyneuropathy: Secondary | ICD-10-CM | POA: Diagnosis not present

## 2017-01-02 DIAGNOSIS — T8781 Dehiscence of amputation stump: Secondary | ICD-10-CM | POA: Diagnosis not present

## 2017-01-02 DIAGNOSIS — N183 Chronic kidney disease, stage 3 (moderate): Secondary | ICD-10-CM | POA: Diagnosis not present

## 2017-01-02 DIAGNOSIS — I130 Hypertensive heart and chronic kidney disease with heart failure and stage 1 through stage 4 chronic kidney disease, or unspecified chronic kidney disease: Secondary | ICD-10-CM | POA: Diagnosis not present

## 2017-01-02 DIAGNOSIS — E1122 Type 2 diabetes mellitus with diabetic chronic kidney disease: Secondary | ICD-10-CM | POA: Diagnosis not present

## 2017-01-02 DIAGNOSIS — I5042 Chronic combined systolic (congestive) and diastolic (congestive) heart failure: Secondary | ICD-10-CM | POA: Diagnosis not present

## 2017-01-02 DIAGNOSIS — E1142 Type 2 diabetes mellitus with diabetic polyneuropathy: Secondary | ICD-10-CM | POA: Diagnosis not present

## 2017-01-03 ENCOUNTER — Ambulatory Visit (INDEPENDENT_AMBULATORY_CARE_PROVIDER_SITE_OTHER): Payer: Medicare Other | Admitting: Family Medicine

## 2017-01-03 ENCOUNTER — Encounter: Payer: Self-pay | Admitting: Family Medicine

## 2017-01-03 VITALS — BP 118/72 | Ht 68.0 in

## 2017-01-03 DIAGNOSIS — I2510 Atherosclerotic heart disease of native coronary artery without angina pectoris: Secondary | ICD-10-CM

## 2017-01-03 DIAGNOSIS — E119 Type 2 diabetes mellitus without complications: Secondary | ICD-10-CM

## 2017-01-03 DIAGNOSIS — Z794 Long term (current) use of insulin: Secondary | ICD-10-CM

## 2017-01-03 DIAGNOSIS — I10 Essential (primary) hypertension: Secondary | ICD-10-CM

## 2017-01-03 DIAGNOSIS — S81802A Unspecified open wound, left lower leg, initial encounter: Secondary | ICD-10-CM | POA: Diagnosis not present

## 2017-01-03 DIAGNOSIS — I5020 Unspecified systolic (congestive) heart failure: Secondary | ICD-10-CM | POA: Diagnosis not present

## 2017-01-03 DIAGNOSIS — IMO0001 Reserved for inherently not codable concepts without codable children: Secondary | ICD-10-CM

## 2017-01-03 MED ORDER — PREDNISONE 5 MG PO TABS: 5.0000 mg | ORAL_TABLET | Freq: Every day | ORAL | 6 refills | Status: DC

## 2017-01-03 NOTE — Progress Notes (Signed)
   Subjective:    Patient ID: [name], male    DOB: 12/01/1951, 66 y.o.   MRN: [id number] Patient arrives office for follow-up of his many chronic concerns HPI Patient arrives for a follow up visit. Patient states he feels like he is doing pretty good-no problems.  Patient claims compliance with diabetes medication. No obvious side effects. Reports no substantial low sugar spells. Most numbers are generally in good range when checked fasting. Generally does not miss a dose of medication. Watching diabetic diet closely  Blood pressure medicine and blood pressure levels reviewed today with patient. Compliant with blood pressure medicine. States does not miss a dose. No obvious side effects. Blood pressure generally good when checked elsewhere. Watching salt intake.   Continues to see Dr. be for his chronic renal failure. We have not received a prior note. Past patient asked to specialist to be sure to send [place] a copy.  Patient due to see the wound center tomorrow for his chronic ulceration on his amputation stump. Performing self-care regularly with help of family along with a tentative nurse compliance. Next  Known congestive heart failure. Reports no excessive weight gain no shortness breath compliant with meds number next   Also compliant with lipid meds. Prior blood work reviewed.   Review of Systems No headache, no major weight loss or weight gain, no chest pain no back pain abdominal pain no change in bowel habits complete ROS otherwise negative     Objective:   Physical Exam  Alert, chronically ill. Appearing gentleman in wheelchair in no acute distress. Blood pressure good on repeat. Lungs clear. Heart regular rate and rhythm. Left leg chronic ulceration at stump. Dressing intact. Slight erythema around days      Assessment & Plan:  Impression 1 type 2 diabetes discussed good control maintain same approach #2 hypertension good control discussed maintain same #3  hyperlipidemia prior blood work reviewed good control discussed maintain same #4 congestive heart failure clinically stable discussed #5 chronic leg ulceration due to see specialists soon #6 history recent pneumonia clinically stable #7 rheumatoid arthritis followed by specialist who also writes his methadone plan medications refilled diet exercise discussed. Recheck in several months WSL

## 2017-01-05 DIAGNOSIS — I130 Hypertensive heart and chronic kidney disease with heart failure and stage 1 through stage 4 chronic kidney disease, or unspecified chronic kidney disease: Secondary | ICD-10-CM | POA: Diagnosis not present

## 2017-01-05 DIAGNOSIS — T8781 Dehiscence of amputation stump: Secondary | ICD-10-CM | POA: Diagnosis not present

## 2017-01-05 DIAGNOSIS — I5042 Chronic combined systolic (congestive) and diastolic (congestive) heart failure: Secondary | ICD-10-CM | POA: Diagnosis not present

## 2017-01-05 DIAGNOSIS — E1142 Type 2 diabetes mellitus with diabetic polyneuropathy: Secondary | ICD-10-CM | POA: Diagnosis not present

## 2017-01-09 DIAGNOSIS — N183 Chronic kidney disease, stage 3 (moderate): Secondary | ICD-10-CM | POA: Diagnosis not present

## 2017-01-09 DIAGNOSIS — I130 Hypertensive heart and chronic kidney disease with heart failure and stage 1 through stage 4 chronic kidney disease, or unspecified chronic kidney disease: Secondary | ICD-10-CM | POA: Diagnosis not present

## 2017-01-09 DIAGNOSIS — T8781 Dehiscence of amputation stump: Secondary | ICD-10-CM | POA: Diagnosis not present

## 2017-01-09 DIAGNOSIS — E1142 Type 2 diabetes mellitus with diabetic polyneuropathy: Secondary | ICD-10-CM | POA: Diagnosis not present

## 2017-01-09 DIAGNOSIS — I5042 Chronic combined systolic (congestive) and diastolic (congestive) heart failure: Secondary | ICD-10-CM | POA: Diagnosis not present

## 2017-01-09 DIAGNOSIS — E1122 Type 2 diabetes mellitus with diabetic chronic kidney disease: Secondary | ICD-10-CM | POA: Diagnosis not present

## 2017-01-11 ENCOUNTER — Ambulatory Visit: Payer: Medicare Other | Admitting: Urology

## 2017-01-13 DIAGNOSIS — T8781 Dehiscence of amputation stump: Secondary | ICD-10-CM | POA: Diagnosis not present

## 2017-01-13 DIAGNOSIS — I130 Hypertensive heart and chronic kidney disease with heart failure and stage 1 through stage 4 chronic kidney disease, or unspecified chronic kidney disease: Secondary | ICD-10-CM | POA: Diagnosis not present

## 2017-01-13 DIAGNOSIS — I5042 Chronic combined systolic (congestive) and diastolic (congestive) heart failure: Secondary | ICD-10-CM | POA: Diagnosis not present

## 2017-01-13 DIAGNOSIS — E1142 Type 2 diabetes mellitus with diabetic polyneuropathy: Secondary | ICD-10-CM | POA: Diagnosis not present

## 2017-01-13 DIAGNOSIS — N183 Chronic kidney disease, stage 3 (moderate): Secondary | ICD-10-CM | POA: Diagnosis not present

## 2017-01-13 DIAGNOSIS — E1122 Type 2 diabetes mellitus with diabetic chronic kidney disease: Secondary | ICD-10-CM | POA: Diagnosis not present

## 2017-01-17 DIAGNOSIS — M2550 Pain in unspecified joint: Secondary | ICD-10-CM | POA: Diagnosis not present

## 2017-01-17 DIAGNOSIS — M0589 Other rheumatoid arthritis with rheumatoid factor of multiple sites: Secondary | ICD-10-CM | POA: Diagnosis not present

## 2017-01-17 DIAGNOSIS — M069 Rheumatoid arthritis, unspecified: Secondary | ICD-10-CM | POA: Diagnosis not present

## 2017-01-17 DIAGNOSIS — M25569 Pain in unspecified knee: Secondary | ICD-10-CM | POA: Diagnosis not present

## 2017-01-17 DIAGNOSIS — M79643 Pain in unspecified hand: Secondary | ICD-10-CM | POA: Diagnosis not present

## 2017-01-18 ENCOUNTER — Ambulatory Visit (INDEPENDENT_AMBULATORY_CARE_PROVIDER_SITE_OTHER): Payer: Medicare Other | Admitting: Urology

## 2017-01-18 DIAGNOSIS — R338 Other retention of urine: Secondary | ICD-10-CM | POA: Diagnosis not present

## 2017-01-19 ENCOUNTER — Telehealth: Payer: Self-pay | Admitting: Family Medicine

## 2017-01-19 NOTE — Telephone Encounter (Signed)
I honestly don't know.  Have only ever seen a couple of these ever  I think we write a Rx and fax to a company in [place]  I can not remember the name (I'll research & let you know)

## 2017-01-19 NOTE — Telephone Encounter (Signed)
We can do referral, power vehicle requires a face to face to document all the reasons because insurance is very resistant to covering

## 2017-01-19 NOTE — Telephone Encounter (Signed)
[employer] Ph# [phone], Fx# [phone] [place]  This is the only place I remember ordering those kinds of things from

## 2017-01-19 NOTE — Telephone Encounter (Signed)
Brendale, Not sure how to put this referral in. Does home health do this?

## 2017-01-19 NOTE — Telephone Encounter (Signed)
[name] called to state that the patient needs a referral to be fitted for a prosthesis and also would like to discuss getting patient a power wheelchair  Please advise

## 2017-01-20 DIAGNOSIS — I130 Hypertensive heart and chronic kidney disease with heart failure and stage 1 through stage 4 chronic kidney disease, or unspecified chronic kidney disease: Secondary | ICD-10-CM | POA: Diagnosis not present

## 2017-01-20 DIAGNOSIS — T8781 Dehiscence of amputation stump: Secondary | ICD-10-CM | POA: Diagnosis not present

## 2017-01-20 DIAGNOSIS — N183 Chronic kidney disease, stage 3 (moderate): Secondary | ICD-10-CM | POA: Diagnosis not present

## 2017-01-20 DIAGNOSIS — E1122 Type 2 diabetes mellitus with diabetic chronic kidney disease: Secondary | ICD-10-CM | POA: Diagnosis not present

## 2017-01-20 DIAGNOSIS — E1142 Type 2 diabetes mellitus with diabetic polyneuropathy: Secondary | ICD-10-CM | POA: Diagnosis not present

## 2017-01-20 DIAGNOSIS — I5042 Chronic combined systolic (congestive) and diastolic (congestive) heart failure: Secondary | ICD-10-CM | POA: Diagnosis not present

## 2017-01-20 NOTE — Telephone Encounter (Signed)
Discussed with pt. rx sent for prosthesis. Pt states he will call back to make an appt for face to face for power wheelchair.

## 2017-01-26 DIAGNOSIS — E1142 Type 2 diabetes mellitus with diabetic polyneuropathy: Secondary | ICD-10-CM | POA: Diagnosis not present

## 2017-01-26 DIAGNOSIS — E1122 Type 2 diabetes mellitus with diabetic chronic kidney disease: Secondary | ICD-10-CM | POA: Diagnosis not present

## 2017-01-26 DIAGNOSIS — T8781 Dehiscence of amputation stump: Secondary | ICD-10-CM | POA: Diagnosis not present

## 2017-01-26 DIAGNOSIS — I130 Hypertensive heart and chronic kidney disease with heart failure and stage 1 through stage 4 chronic kidney disease, or unspecified chronic kidney disease: Secondary | ICD-10-CM | POA: Diagnosis not present

## 2017-01-26 DIAGNOSIS — N183 Chronic kidney disease, stage 3 (moderate): Secondary | ICD-10-CM | POA: Diagnosis not present

## 2017-01-26 DIAGNOSIS — I5042 Chronic combined systolic (congestive) and diastolic (congestive) heart failure: Secondary | ICD-10-CM | POA: Diagnosis not present

## 2017-01-27 ENCOUNTER — Ambulatory Visit: Payer: Medicare Other | Admitting: Internal Medicine

## 2017-02-02 DIAGNOSIS — I130 Hypertensive heart and chronic kidney disease with heart failure and stage 1 through stage 4 chronic kidney disease, or unspecified chronic kidney disease: Secondary | ICD-10-CM | POA: Diagnosis not present

## 2017-02-02 DIAGNOSIS — N183 Chronic kidney disease, stage 3 (moderate): Secondary | ICD-10-CM | POA: Diagnosis not present

## 2017-02-02 DIAGNOSIS — E1122 Type 2 diabetes mellitus with diabetic chronic kidney disease: Secondary | ICD-10-CM | POA: Diagnosis not present

## 2017-02-02 DIAGNOSIS — E1142 Type 2 diabetes mellitus with diabetic polyneuropathy: Secondary | ICD-10-CM | POA: Diagnosis not present

## 2017-02-02 DIAGNOSIS — I5042 Chronic combined systolic (congestive) and diastolic (congestive) heart failure: Secondary | ICD-10-CM | POA: Diagnosis not present

## 2017-02-02 DIAGNOSIS — T8781 Dehiscence of amputation stump: Secondary | ICD-10-CM | POA: Diagnosis not present

## 2017-02-06 DIAGNOSIS — I5042 Chronic combined systolic (congestive) and diastolic (congestive) heart failure: Secondary | ICD-10-CM | POA: Diagnosis not present

## 2017-02-06 DIAGNOSIS — E1122 Type 2 diabetes mellitus with diabetic chronic kidney disease: Secondary | ICD-10-CM | POA: Diagnosis not present

## 2017-02-06 DIAGNOSIS — E1142 Type 2 diabetes mellitus with diabetic polyneuropathy: Secondary | ICD-10-CM | POA: Diagnosis not present

## 2017-02-06 DIAGNOSIS — T8781 Dehiscence of amputation stump: Secondary | ICD-10-CM | POA: Diagnosis not present

## 2017-02-06 DIAGNOSIS — N183 Chronic kidney disease, stage 3 (moderate): Secondary | ICD-10-CM | POA: Diagnosis not present

## 2017-02-06 DIAGNOSIS — I130 Hypertensive heart and chronic kidney disease with heart failure and stage 1 through stage 4 chronic kidney disease, or unspecified chronic kidney disease: Secondary | ICD-10-CM | POA: Diagnosis not present

## 2017-02-16 DIAGNOSIS — T8781 Dehiscence of amputation stump: Secondary | ICD-10-CM | POA: Diagnosis not present

## 2017-02-16 DIAGNOSIS — I130 Hypertensive heart and chronic kidney disease with heart failure and stage 1 through stage 4 chronic kidney disease, or unspecified chronic kidney disease: Secondary | ICD-10-CM | POA: Diagnosis not present

## 2017-02-16 DIAGNOSIS — E1142 Type 2 diabetes mellitus with diabetic polyneuropathy: Secondary | ICD-10-CM | POA: Diagnosis not present

## 2017-02-16 DIAGNOSIS — I5042 Chronic combined systolic (congestive) and diastolic (congestive) heart failure: Secondary | ICD-10-CM | POA: Diagnosis not present

## 2017-02-16 DIAGNOSIS — N183 Chronic kidney disease, stage 3 (moderate): Secondary | ICD-10-CM | POA: Diagnosis not present

## 2017-02-16 DIAGNOSIS — E1122 Type 2 diabetes mellitus with diabetic chronic kidney disease: Secondary | ICD-10-CM | POA: Diagnosis not present

## 2017-02-17 ENCOUNTER — Encounter: Payer: Self-pay | Admitting: Cardiology

## 2017-02-17 ENCOUNTER — Ambulatory Visit (INDEPENDENT_AMBULATORY_CARE_PROVIDER_SITE_OTHER): Payer: Medicare Other | Admitting: Cardiology

## 2017-02-17 VITALS — BP 108/58 | HR 76 | Ht 68.0 in | Wt 160.0 lb

## 2017-02-17 DIAGNOSIS — I5022 Chronic systolic (congestive) heart failure: Secondary | ICD-10-CM

## 2017-02-17 DIAGNOSIS — I2510 Atherosclerotic heart disease of native coronary artery without angina pectoris: Secondary | ICD-10-CM

## 2017-02-17 DIAGNOSIS — I10 Essential (primary) hypertension: Secondary | ICD-10-CM | POA: Diagnosis not present

## 2017-02-17 DIAGNOSIS — E782 Mixed hyperlipidemia: Secondary | ICD-10-CM

## 2017-02-17 NOTE — Patient Instructions (Addendum)
Your physician recommends that you schedule a follow-up appointment in: 3 months Dr [name]   Your physician recommends that you continue on your current medications as directed. Please refer to the Current Medication list given to you today.       Thank you for choosing [facility] !

## 2017-02-17 NOTE — Progress Notes (Signed)
Clinical Summary [name] is a 66 y.o.male seen as new patient. Previously followed at [facility]  1. CAD with prior CABG/Chronic systolic HF/ICM - prior angioplasty in 1993 and 1995.  - s/p CABG 06/2015 at [facility] with LIMA-LAD, SVG-PDA, SVG-intermediate. - LVEF prior to CABG 30-35%. After CABG LVEF improved to 45-50%.  - 08/2016 echo LVEF 30-35%, grade II diastolic dysfunction  - no recent chest pain. No SOB or DOE. He is wheelchair bound.  - stable weights. Occaisonal abdominal distension.   2. HTN - compliant with meds  3. Hyperlipidemia 09/2016 TC 118 TG 44 HDL 39 LDL 70  - compliant with statin  4. Postop afib - develoed after CABG in 06/2015 - now off meds  5. Valvular hear disease.  - 08/2016 mild to mod AS. Mean grad 12, AVA VTI 1.2. Moderate MR. Mild TR.    6. Rheumatoid arthritis - followed by pcp  7. CKD 3 - was transiently on HD, now off.   8. PAD - s/p left BKA - followed in wound clinic for chronic ulceration     Past Medical History:  Diagnosis Date  . Acute on chronic combined systolic and diastolic CHF (congestive heart failure) ([place]) 09/02/2016  . Chronic back pain   . Chronic neck pain   . Coronary artery disease 1993   angioplasty after AMI  . Diabetes mellitus   . Diabetic neuropathy ([place])   . Hyperkalemia   . Hypertension   . Loculated pleural effusion 09/09/2016  . Pancytopenia ([place]) 08/13/2014  . 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  . Rheumatoid arthritis(714.0)   . Seizure ([place]) 08/13/2014   Pt denies     Allergies  Allergen Reactions  . Sulfa Antibiotics Rash     Current Outpatient Prescriptions  Medication Sig Dispense Refill  . acetaminophen (TYLENOL) 325 MG tablet Take 650 mg by mouth every 6 (six) hours as needed for mild pain or moderate pain.     . Amino Acids-Protein Hydrolys (PRO-STAT 64 PO) Take 30 mLs by mouth 2 (two) times daily.    [name] amitriptyline (ELAVIL) 50 MG tablet  Take 1.5 tablets (75 mg total) by mouth at bedtime. 45 tablet 6  . Artificial Tear Ointment (REFRESH P.M. OP) Apply 1 drop to eye daily as needed (FOR DY EYES). ONE APPLICATION BOTH EYES. For dry eyes not relieved by theratears every 6 hours     . aspirin 81 MG chewable tablet Chew 81 mg by mouth every morning.     [name] atorvastatin (LIPITOR) 80 MG tablet Take 1 tablet (80 mg total) by mouth daily. 30 tablet 5  . Balsam [place]-Castor Oil (VENELEX) OINT Apply 1 application topically 3 (three) times daily. Apply to sacrum every shift day, evening, Night and as needed.    . Carboxymethylcellulose Sodium (THERATEARS) 0.25 % SOLN Apply 2 drops to both eyes three times a day    . collagenase (SANTYL) ointment Apply 1 application topically daily. Apply to residual surgical site per tx orders    . docusate sodium (COLACE) 100 MG capsule Take 100 mg by mouth 2 (two) times daily.    . insulin aspart protamine- aspart (NOVOLOG MIX 70/30) (70-30) 100 UNIT/ML injection 14 units in am and 10 units pm    . insulin NPH Human (HUMULIN N) 100 UNIT/ML injection Inject 0.05 mLs (5 Units total) into the skin 2 (two) times daily before a meal. Give 5 units subcutaneous twice a day (Patient  not taking: Reported on 01/03/2017) 10 mL 11  . iron polysaccharides (NIFEREX) 150 MG capsule Take 150 mg by mouth daily.    [name] levalbuterol (XOPENEX) 0.63 MG/3ML nebulizer solution Take 3 mLs (0.63 mg total) by nebulization every 4 (four) hours as needed for wheezing or shortness of breath.    . Melatonin 3 MG TABS Take 2 tablets by mouth at bedtime    . metoprolol succinate (TOPROL-XL) 25 MG 24 hr tablet Take 1.5 tablets (37.5 mg total) by mouth every morning. 30 tablet 5  . Multiple Vitamins-Minerals (THERA-M ENHANCED PO) 1 tablet by mouth daily    . nystatin cream (MYCOSTATIN) Apply 1 application topically daily. Apply to bottom rash QD     . olopatadine (PATANOL) 0.1 % ophthalmic solution Place 1 drop into both eyes 2 (two) times daily.      [name] omega-3 acid ethyl esters (LOVAZA) 1 g capsule Take 2 g by mouth 2 (two) times daily.    [name] oxyCODONE (OXY IR/ROXICODONE) 5 MG immediate release tablet Take 1 tablet by mouth three times daily. 10 tablet 0  . OXYGEN Inhale 2 L into the lungs continuous. O 2 at 2 LPM via n/c to keep O2 sat above 90 % every shift     . piperacillin-tazobactam (ZOSYN) 3.375 GM/50ML IVPB Inject 50 mLs (3.375 g total) into the vein every 8 (eight) hours. 650 mL 0  . polyethylene glycol powder (MIRALAX) powder Take 17 g by mouth 2 (two) times daily.    . predniSONE (DELTASONE) 2.5 MG tablet Take 3 tablets (7.5 mg total) by mouth daily with breakfast. 30 tablet 0  . predniSONE (DELTASONE) 5 MG tablet Take 1 tablet (5 mg total) by mouth daily with breakfast. 30 tablet 6  . pregabalin (LYRICA) 25 MG capsule Take one capsule by mouth at bedtime 30 capsule 5  . tamsulosin (FLOMAX) 0.4 MG CAPS capsule Take 1 capsule (0.4 mg total) by mouth at bedtime. 30 capsule 5  . thiamine (VITAMIN B-1) 100 MG tablet Take 100 mg by mouth daily.    [name] torsemide (DEMADEX) 10 MG tablet Give 20 mg by mouth once a day every other day Give 20 mg by mouth once a day every other day 30 tablet 5  . vitamin C (ASCORBIC ACID) 500 MG tablet Take 500 mg by mouth daily.    [name] zinc sulfate 220 (50 Zn) MG capsule Take 220 mg by mouth daily.     No current facility-administered medications for this visit.      Past Surgical History:  Procedure Laterality Date  . CARDIAC SURGERY    . FRACTURE SURGERY    . JOINT REPLACEMENT       Allergies  Allergen Reactions  . Sulfa Antibiotics Rash      Family History  Problem Relation Age of Onset  . Diabetes Father   . Dementia Father   . Heart attack Brother     multiple brothers with MIs  . Hypertension Mother   . Stroke Mother      Social History [name] reports that he has never smoked. He has never used smokeless tobacco. [name] reports that he does not drink alcohol.   Review of  Systems CONSTITUTIONAL: No weight loss, fever, chills, weakness or fatigue.  HEENT: Eyes: No visual loss, blurred vision, double vision or yellow sclerae.No hearing loss, sneezing, congestion, runny nose or sore throat.  SKIN: No rash or itching.  CARDIOVASCULAR: per HPI RESPIRATORY: No shortness of breath, cough  or sputum.  GASTROINTESTINAL: No anorexia, nausea, vomiting or diarrhea. No abdominal pain or blood.  GENITOURINARY: No burning on urination, no polyuria NEUROLOGICAL: No headache, dizziness, syncope, paralysis, ataxia, numbness or tingling in the extremities. No change in bowel or bladder control.  MUSCULOSKELETAL: No muscle, back pain, joint pain or stiffness.  LYMPHATICS: No enlarged nodes. No history of splenectomy.  PSYCHIATRIC: No history of depression or anxiety.  ENDOCRINOLOGIC: No reports of sweating, cold or heat intolerance. No polyuria or polydipsia.  [name]   Physical Examination Vitals:   02/17/17 1422  BP: (!) 108/58  Pulse: 76   Vitals:   02/17/17 1422  Weight: 160 lb (72.6 kg)  Height: 5\' 8"  (1.727 m)    Gen: resting comfortably, no acute distress HEENT: no scleral icterus, pupils equal round and reactive, no palptable cervical adenopathy,  CV: 2/6 systolic murmur RUSB, no m/r/g, no jvd Resp: Clear to auscultation bilaterally GI: abdomen is soft, non-tender, non-distended, normal bowel sounds, no hepatosplenomegaly MSK: extremities are warm, no edema.  Skin: warm, no rash Neuro:  no focal deficits Psych: appropriate affect   Diagnostic Studies [facility] 07/07/2015 Clinical Diagnoses and Echocardiographic Findings Technically difficult study due to chest wall/lung interference Contractile left ventricular dysfunction (severe) Likely anteroseptal and apical myocardial infarction Decreased left ventricular ejection fraction (50-53%) Diastolic left ventricular dysfunction Dilated left atrium (mild) Aortic sclerosis Dilated right ventricle Contractile right  ventricular dysfunction Dilated right atrium  [facility] 07/09/2015 Cardiac cath 1. Significant 3-vessel coronary artery disease. 1. Suggest LIMA-LAD, SVG-PDA, SVG-[place] 2. Normal LV filling pressures  [facility] 07/16/2015 Carotid Final Interpretation  Right Carotid: There is evidence in the ICA of a less than 40%  stenosis. Non-hemodynamically significant plaque  noted in the CCA. Plaque noted in the ECA.  Left Carotid: There is evidence in the ICA of a 40-59% stenosis.  Non-hemodynamically significant plaque noted in the  CCA. Plaque noted in the ECA.  Vertebrals: Both vertebral arteries were patent with antegrade flow.  Subclavians: Right subclavian artery flow was disturbed. Normal flow  hemodynamics were seen in the left subclavian artery.    08/2016 echo Study Conclusions  - Left ventricle: The cavity size was normal. Wall thickness was   normal. Systolic function was moderately to severely reduced. The   estimated ejection fraction was in the range of 30% to 35%.   Features are consistent with a pseudonormal left ventricular   filling pattern, with concomitant abnormal relaxation and   increased filling pressure (grade 2 diastolic dysfunction).   Doppler parameters are consistent with high ventricular filling   pressure. - Regional wall motion abnormality: Severe hypokinesis of the   mid-apical anterior and mid anteroseptal myocardium. - Aortic valve: Moderately to severely calcified annulus. Mildly   thickened, moderately calcified leaflets. There was mild to   moderate stenosis. Peak velocity (S): 233 cm/s. Mean gradient   (S): 12 mm Hg. Valve area (VTI): 1.23 cm^2. Valve area (Vmax):   1.31 cm^2. Valve area (Vmean): 1.19 cm^2. - Mitral valve: Mildly calcified annulus. Mildly calcified leaflets   . There was moderate regurgitation. - Left atrium: The atrium was mildly dilated. - Right ventricle: Systolic function was moderately reduced. - Tricuspid valve: There was mild  regurgitation.     Assessment and Plan  1. CAD/ICM/Chronic systolic HF - no current symptoms - continue current meds. Medical therapy limited by poor renal function  2. HTN - at goal, continue current meds  3. Hyperlipidemia - at goal, continue statin  4. Postop afib -  occurred after prior CABG. No recurrence per previous clinic notes, now off therapy - continue to monitor.  5. Valvular heart disease - mild to mod AS, mod MR, mild TR based on 08/2016 echo - continue to monitor, no current symptoms        Arnoldo Lenis, M.D.

## 2017-02-22 ENCOUNTER — Ambulatory Visit: Payer: Medicare Other | Admitting: Urology

## 2017-02-28 ENCOUNTER — Ambulatory Visit: Payer: Medicare Other | Admitting: Urology

## 2017-03-07 ENCOUNTER — Ambulatory Visit: Payer: Medicare Other | Admitting: Urology

## 2017-03-14 DIAGNOSIS — M0579 Rheumatoid arthritis with rheumatoid factor of multiple sites without organ or systems involvement: Secondary | ICD-10-CM | POA: Diagnosis not present

## 2017-03-14 DIAGNOSIS — G8929 Other chronic pain: Secondary | ICD-10-CM | POA: Diagnosis not present

## 2017-03-15 ENCOUNTER — Ambulatory Visit: Payer: Medicare Other | Admitting: Urology

## 2017-03-17 ENCOUNTER — Ambulatory Visit (INDEPENDENT_AMBULATORY_CARE_PROVIDER_SITE_OTHER): Payer: Medicare Other | Admitting: Urology

## 2017-03-17 DIAGNOSIS — R338 Other retention of urine: Secondary | ICD-10-CM | POA: Diagnosis not present

## 2017-03-30 ENCOUNTER — Ambulatory Visit: Payer: Medicare Other | Admitting: Family Medicine

## 2017-04-12 ENCOUNTER — Encounter: Payer: Self-pay | Admitting: Family Medicine

## 2017-04-12 ENCOUNTER — Ambulatory Visit (INDEPENDENT_AMBULATORY_CARE_PROVIDER_SITE_OTHER): Payer: Medicare HMO | Admitting: Family Medicine

## 2017-04-12 VITALS — BP 128/76 | Ht 68.0 in | Wt 175.0 lb

## 2017-04-12 DIAGNOSIS — E119 Type 2 diabetes mellitus without complications: Secondary | ICD-10-CM

## 2017-04-12 DIAGNOSIS — Z79899 Other long term (current) drug therapy: Secondary | ICD-10-CM

## 2017-04-12 DIAGNOSIS — M069 Rheumatoid arthritis, unspecified: Secondary | ICD-10-CM

## 2017-04-12 DIAGNOSIS — E785 Hyperlipidemia, unspecified: Secondary | ICD-10-CM

## 2017-04-12 LAB — POCT GLYCOSYLATED HEMOGLOBIN (HGB A1C): Hemoglobin A1C: 5.1

## 2017-04-12 MED ORDER — INSULIN ASPART PROT & ASPART (70-30 MIX) 100 UNIT/ML ~~LOC~~ SUSP: SUBCUTANEOUS | 2 refills | Status: DC

## 2017-04-12 NOTE — Patient Instructions (Signed)
plz cut morning insulin to ten units, and eve insulin to 8 units

## 2017-04-12 NOTE — Progress Notes (Signed)
   Subjective:    Patient ID: [name], male    DOB: 28-Oct-1951, 66 y.o.   MRN: [id number]  Hypertension  This is a chronic problem. The current episode started more than 1 year ago.  A1C 5.1  Wants rx for electric wheelchair. Patient has history of left below the knee amputation. Has history of substantial right partial foot amputation. Has history of progressive disabling rheumatoid arthritis.  Results for orders placed or performed in visit on 04/12/17  POCT glycosylated hemoglobin (Hb A1C)  Result Value Ref Range   Hemoglobin A1C 5.1    Patient claims compliance with diabetes medication. No obvious side effects. Reports no substantial low sugar spells. Most numbers are generally in good range when checked fasting. Generally does not miss a dose of medication. Watching diabetic diet closely 14 and 10 on the insuli Low spell times one  Shaking and jumpy with it.  Patient claims compliance with diabetes medication. No obvious side effects. Reports no substantial low sugar spells. Most numbers are generally in good range when checked fasting. Generally does not miss a dose of medication. Watching diabetic diet closely  Blood pressure medicine and blood pressure levels reviewed today with patient. Compliant with blood pressure medicine. States does not miss a dose. No obvious side effects. Blood pressure generally good when checked elsewhere. Watching salt intake.     Review of Systems No headache, no major weight loss or weight gain, no chest pain no back pain abdominal pain no change in bowel habits complete ROS otherwise negative     Objective:   Physical Exam  Alert and oriented, vitals reviewed and stable, NAD ENT-TM's and ext canals WNL bilat via otoscopic exam Soft palate, tonsils and post pharynx WNL via oropharyngeal exam Neck-symmetric, no masses; thyroid nonpalpable and nontender Pulmonary-no tachypnea or accessory muscle use; Clear without wheezes via  auscultation Card--no abnrml murmurs, rhythm reg and rate WNL Carotid pulses symmetric, without bruits Below the knee amputation left side stopped healing well right partial foot amputation pulses fair sensation overall intact. Hands tremendously deformed with progressive rheumatoid arthritis.      Assessment & Plan:  Impression 1 wheelchair ridden patient left below the knee amputation right foot partial amputation severe rheumatoid arthritis. I do think this patient should have an electric wheelchair to improve his mobility. Currently confined to wheelchair. #2 type 2 diabetes control good but to tight discuss #3 hyperlipidemia status uncertain plan appropriate blood work. Prescription written for electric wheelchair. Insulin adjusted downward. Diet exercise discussed. Recheck in several months. WSL

## 2017-04-19 ENCOUNTER — Ambulatory Visit (INDEPENDENT_AMBULATORY_CARE_PROVIDER_SITE_OTHER): Payer: Medicare HMO | Admitting: Urology

## 2017-04-19 DIAGNOSIS — R338 Other retention of urine: Secondary | ICD-10-CM | POA: Diagnosis not present

## 2017-04-19 DIAGNOSIS — N401 Enlarged prostate with lower urinary tract symptoms: Secondary | ICD-10-CM | POA: Diagnosis not present

## 2017-04-22 DIAGNOSIS — L8990 Pressure ulcer of unspecified site, unspecified stage: Secondary | ICD-10-CM | POA: Diagnosis not present

## 2017-04-22 DIAGNOSIS — L03116 Cellulitis of left lower limb: Secondary | ICD-10-CM | POA: Diagnosis not present

## 2017-04-22 DIAGNOSIS — J189 Pneumonia, unspecified organism: Secondary | ICD-10-CM | POA: Diagnosis not present

## 2017-04-22 DIAGNOSIS — M069 Rheumatoid arthritis, unspecified: Secondary | ICD-10-CM | POA: Diagnosis not present

## 2017-04-22 DIAGNOSIS — L02416 Cutaneous abscess of left lower limb: Secondary | ICD-10-CM | POA: Diagnosis not present

## 2017-04-22 DIAGNOSIS — A419 Sepsis, unspecified organism: Secondary | ICD-10-CM | POA: Diagnosis not present

## 2017-04-22 DIAGNOSIS — R7881 Bacteremia: Secondary | ICD-10-CM | POA: Diagnosis not present

## 2017-05-05 DIAGNOSIS — E785 Hyperlipidemia, unspecified: Secondary | ICD-10-CM | POA: Diagnosis not present

## 2017-05-05 DIAGNOSIS — Z79899 Other long term (current) drug therapy: Secondary | ICD-10-CM | POA: Diagnosis not present

## 2017-05-06 LAB — LIPID PANEL
CHOLESTEROL TOTAL: 136 mg/dL (ref 100–199)
Chol/HDL Ratio: 3 ratio (ref 0.0–5.0)
HDL: 45 mg/dL (ref >39)
LDL CALC: 64 mg/dL (ref 0–99)
TRIGLYCERIDES: 135 mg/dL (ref 0–149)
VLDL Cholesterol Cal: 27 mg/dL (ref 5–40)

## 2017-05-06 LAB — BASIC METABOLIC PANEL
BUN/Creatinine Ratio: 21 (ref 10–24)
BUN: 26 mg/dL (ref 8–27)
CO2: 29 mmol/L (ref 18–29)
Calcium: 9.1 mg/dL (ref 8.6–10.2)
Chloride: 97 mmol/L (ref 96–106)
Creatinine, Ser: 1.23 mg/dL (ref 0.76–1.27)
GFR calc Af Amer: 70 mL/min/{1.73_m2} (ref >59)
GFR, EST NON AFRICAN AMERICAN: 60 mL/min/{1.73_m2} (ref >59)
Glucose: 106 mg/dL — ABNORMAL HIGH (ref 65–99)
Potassium: 4.2 mmol/L (ref 3.5–5.2)
Sodium: 142 mmol/L (ref 134–144)

## 2017-05-06 LAB — HEPATIC FUNCTION PANEL
ALBUMIN: 3.3 g/dL — AB (ref 3.6–4.8)
ALK PHOS: 406 IU/L — AB (ref 39–117)
ALT: 38 IU/L (ref 0–44)
AST: 37 IU/L (ref 0–40)
BILIRUBIN TOTAL: 0.4 mg/dL (ref 0.0–1.2)
Bilirubin, Direct: 0.16 mg/dL (ref 0.00–0.40)
Total Protein: 6.7 g/dL (ref 6.0–8.5)

## 2017-05-17 ENCOUNTER — Other Ambulatory Visit: Payer: Self-pay | Admitting: Family Medicine

## 2017-05-18 ENCOUNTER — Encounter: Payer: Self-pay | Admitting: Cardiology

## 2017-05-18 ENCOUNTER — Ambulatory Visit: Payer: Medicare HMO | Admitting: Cardiology

## 2017-05-18 NOTE — Progress Notes (Deleted)
Clinical Summary Mr. Barich is a 66 y.o.male  1. CAD with prior CABG/Chronic systolic HF/ICM - prior angioplasty in 1993 and 1995.  - s/p CABG 06/2015 at [facility] with LIMA-LAD, SVG-PDA, SVG-intermediate. - LVEF prior to CABG 30-35%. After CABG LVEF improved to 45-50%.  - 08/2016 echo LVEF 30-35%, grade II diastolic dysfunction  - no recent chest pain. No SOB or DOE. He is wheelchair bound.  - stable weights. Occaisonal abdominal distension.   2. HTN - compliant with meds  3. Hyperlipidemia 09/2016 TC 118 TG 44 HDL 39 LDL 70  - compliant with statin  4. Postop afib - develoed after CABG in 06/2015 - now off meds  5. Valvular hear disease.  - 08/2016 mild to mod AS. Mean grad 12, AVA VTI 1.2. Moderate MR. Mild TR.    6. Rheumatoid arthritis - followed by pcp  7. CKD 3 - was transiently on HD, now off.   8. PAD - s/p left BKA - followed in wound clinic for chronic ulceration Past Medical History:  Diagnosis Date  . Acute on chronic combined systolic and diastolic CHF (congestive heart failure) ([place]) 09/02/2016  . Chronic back pain   . Chronic neck pain   . Coronary artery disease 1993   angioplasty after AMI  . Diabetes mellitus   . Diabetic neuropathy ([place])   . Hyperkalemia   . Hypertension   . Loculated pleural effusion 09/09/2016  . Pancytopenia ([place]) 08/13/2014  . 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  . Rheumatoid arthritis(714.0)   . Seizure ([place]) 08/13/2014   Pt denies     Allergies  Allergen Reactions  . Sulfa Antibiotics Rash     Current Outpatient Prescriptions  Medication Sig Dispense Refill  . acetaminophen (TYLENOL) 325 MG tablet Take 650 mg by mouth every 6 (six) hours as needed for mild pain or moderate pain.     . Amino Acids-Protein Hydrolys (PRO-STAT 64 PO) Take 30 mLs by mouth 2 (two) times daily.    [name] amitriptyline (ELAVIL) 50 MG tablet Take 1.5 tablets (75 mg total) by mouth at  bedtime. 45 tablet 6  . Artificial Tear Ointment (REFRESH P.M. OP) Apply 1 drop to eye daily as needed (FOR DY EYES). ONE APPLICATION BOTH EYES. For dry eyes not relieved by theratears every 6 hours     . aspirin 81 MG chewable tablet Chew 81 mg by mouth every morning.     [name] atorvastatin (LIPITOR) 80 MG tablet Take 1 tablet (80 mg total) by mouth daily. 30 tablet 5  . Carboxymethylcellulose Sodium (THERATEARS) 0.25 % SOLN Apply 2 drops to both eyes three times a day    . docusate sodium (COLACE) 100 MG capsule Take 100 mg by mouth 2 (two) times daily.    . insulin aspart protamine- aspart (NOVOLOG MIX 70/30) (70-30) 100 UNIT/ML injection 10 units in am and 8 units pm 10 mL 2  . LYRICA 25 MG capsule TAKE ONE CAPSULE BY MOUTH AT BEDTIME. 30 capsule 0  . metoprolol succinate (TOPROL-XL) 25 MG 24 hr tablet Take 1.5 tablets (37.5 mg total) by mouth every morning. 30 tablet 5  . polyethylene glycol powder (MIRALAX) powder Take 17 g by mouth 2 (two) times daily.    . predniSONE (DELTASONE) 5 MG tablet Take 1 tablet (5 mg total) by mouth daily with breakfast. 30 tablet 6  . tamsulosin (FLOMAX) 0.4 MG CAPS capsule Take 1 capsule (0.4 mg total) by  mouth at bedtime. 30 capsule 5  . torsemide (DEMADEX) 10 MG tablet Give 20 mg by mouth once a day every other day Give 20 mg by mouth once a day every other day 30 tablet 5   No current facility-administered medications for this visit.      Past Surgical History:  Procedure Laterality Date  . CARDIAC SURGERY    . FRACTURE SURGERY    . JOINT REPLACEMENT    . LEG AMPUTATION  06/216   left leg  . MUSCLE BIOPSY  06/2016     Allergies  Allergen Reactions  . Sulfa Antibiotics Rash      Family History  Problem Relation Age of Onset  . Diabetes Father   . Dementia Father   . Heart attack Brother        multiple brothers with MIs  . Hypertension Mother   . Stroke Mother      Social History Mr. Shiffler reports that he has never smoked. He has  never used smokeless tobacco. Mr. Reister reports that he does not drink alcohol.   Review of Systems CONSTITUTIONAL: No weight loss, fever, chills, weakness or fatigue.  HEENT: Eyes: No visual loss, blurred vision, double vision or yellow sclerae.No hearing loss, sneezing, congestion, runny nose or sore throat.  SKIN: No rash or itching.  CARDIOVASCULAR:  RESPIRATORY: No shortness of breath, cough or sputum.  GASTROINTESTINAL: No anorexia, nausea, vomiting or diarrhea. No abdominal pain or blood.  GENITOURINARY: No burning on urination, no polyuria NEUROLOGICAL: No headache, dizziness, syncope, paralysis, ataxia, numbness or tingling in the extremities. No change in bowel or bladder control.  MUSCULOSKELETAL: No muscle, back pain, joint pain or stiffness.  LYMPHATICS: No enlarged nodes. No history of splenectomy.  PSYCHIATRIC: No history of depression or anxiety.  ENDOCRINOLOGIC: No reports of sweating, cold or heat intolerance. No polyuria or polydipsia.  [name]   Physical Examination There were no vitals filed for this visit. There were no vitals filed for this visit.  Gen: resting comfortably, no acute distress HEENT: no scleral icterus, pupils equal round and reactive, no palptable cervical adenopathy,  CV Resp: Clear to auscultation bilaterally GI: abdomen is soft, non-tender, non-distended, normal bowel sounds, no hepatosplenomegaly MSK: extremities are warm, no edema.  Skin: warm, no rash Neuro:  no focal deficits Psych: appropriate affect   Diagnostic Studies [facility] 07/07/2015 Clinical Diagnoses and Echocardiographic Findings Technically difficult study due to chest wall/lung interference Contractile left ventricular dysfunction (severe) Likely anteroseptal and apical myocardial infarction Decreased left ventricular ejection fraction (21-19%) Diastolic left ventricular dysfunction Dilated left atrium (mild) Aortic sclerosis Dilated right ventricle Contractile right  ventricular dysfunction Dilated right atrium  [facility] 07/09/2015 Cardiac cath 1. Significant 3-vessel coronary artery disease. 1. Suggest LIMA-LAD, SVG-PDA, SVG-[place] 2. Normal LV filling pressures  [facility] 07/16/2015 Carotid Final Interpretation  Right Carotid: There is evidence in the ICA of a less than 40%  stenosis. Non-hemodynamically significant plaque  noted in the CCA. Plaque noted in the ECA.  Left Carotid: There is evidence in the ICA of a 40-59% stenosis.  Non-hemodynamically significant plaque noted in the  CCA. Plaque noted in the ECA.  Vertebrals: Both vertebral arteries were patent with antegrade flow.  Subclavians: Right subclavian artery flow was disturbed. Normal flow  hemodynamics were seen in the left subclavian artery.    08/2016 echo Study Conclusions  - Left ventricle: The cavity size was normal. Wall thickness was normal. Systolic function was moderately to severely reduced. The estimated ejection fraction was  in the range of 30% to 35%. Features are consistent with a pseudonormal left ventricular filling pattern, with concomitant abnormal relaxation and increased filling pressure (grade 2 diastolic dysfunction). Doppler parameters are consistent with high ventricular filling pressure. - Regional wall motion abnormality: Severe hypokinesis of the mid-apical anterior and mid anteroseptal myocardium. - Aortic valve: Moderately to severely calcified annulus. Mildly thickened, moderately calcified leaflets. There was mild to moderate stenosis. Peak velocity (S): 233 cm/s. Mean gradient (S): 12 mm Hg. Valve area (VTI): 1.23 cm^2. Valve area (Vmax): 1.31 cm^2. Valve area (Vmean): 1.19 cm^2. - Mitral valve: Mildly calcified annulus. Mildly calcified leaflets . There was moderate regurgitation. - Left atrium: The atrium was mildly dilated. - Right ventricle: Systolic function was moderately reduced. - Tricuspid valve: There was mild  regurgitation.      Assessment and Plan  1. CAD/ICM/Chronic systolic HF - no current symptoms - continue current meds. Medical therapy limited by poor renal function  2. HTN - at goal, continue current meds  3. Hyperlipidemia - at goal, continue statin  4. Postop afib - occurred after prior CABG. No recurrence per previous clinic notes, now off therapy - continue to monitor.  5. Valvular heart disease - mild to mod AS, mod MR, mild TR based on 08/2016 echo - continue to monitor, no current symptoms      Arnoldo Lenis, M.D., F.A.C.C.

## 2017-05-22 DIAGNOSIS — L03116 Cellulitis of left lower limb: Secondary | ICD-10-CM | POA: Diagnosis not present

## 2017-05-22 DIAGNOSIS — L02416 Cutaneous abscess of left lower limb: Secondary | ICD-10-CM | POA: Diagnosis not present

## 2017-05-22 DIAGNOSIS — L8990 Pressure ulcer of unspecified site, unspecified stage: Secondary | ICD-10-CM | POA: Diagnosis not present

## 2017-05-22 DIAGNOSIS — R7881 Bacteremia: Secondary | ICD-10-CM | POA: Diagnosis not present

## 2017-05-22 DIAGNOSIS — M069 Rheumatoid arthritis, unspecified: Secondary | ICD-10-CM | POA: Diagnosis not present

## 2017-05-22 DIAGNOSIS — A419 Sepsis, unspecified organism: Secondary | ICD-10-CM | POA: Diagnosis not present

## 2017-05-22 DIAGNOSIS — J189 Pneumonia, unspecified organism: Secondary | ICD-10-CM | POA: Diagnosis not present

## 2017-05-24 ENCOUNTER — Ambulatory Visit: Payer: Medicare HMO | Admitting: Urology

## 2017-05-25 ENCOUNTER — Other Ambulatory Visit: Payer: Self-pay | Admitting: Family Medicine

## 2017-05-31 ENCOUNTER — Ambulatory Visit (INDEPENDENT_AMBULATORY_CARE_PROVIDER_SITE_OTHER): Payer: Medicare HMO | Admitting: Urology

## 2017-05-31 ENCOUNTER — Ambulatory Visit (INDEPENDENT_AMBULATORY_CARE_PROVIDER_SITE_OTHER): Payer: Medicare HMO | Admitting: Family Medicine

## 2017-05-31 ENCOUNTER — Encounter: Payer: Self-pay | Admitting: Family Medicine

## 2017-05-31 VITALS — BP 130/82 | Ht 68.0 in

## 2017-05-31 DIAGNOSIS — N401 Enlarged prostate with lower urinary tract symptoms: Secondary | ICD-10-CM

## 2017-05-31 DIAGNOSIS — R748 Abnormal levels of other serum enzymes: Secondary | ICD-10-CM

## 2017-05-31 DIAGNOSIS — R809 Proteinuria, unspecified: Secondary | ICD-10-CM | POA: Diagnosis not present

## 2017-05-31 DIAGNOSIS — L989 Disorder of the skin and subcutaneous tissue, unspecified: Secondary | ICD-10-CM | POA: Diagnosis not present

## 2017-05-31 DIAGNOSIS — E559 Vitamin D deficiency, unspecified: Secondary | ICD-10-CM | POA: Diagnosis not present

## 2017-05-31 DIAGNOSIS — N183 Chronic kidney disease, stage 3 (moderate): Secondary | ICD-10-CM | POA: Diagnosis not present

## 2017-05-31 DIAGNOSIS — M069 Rheumatoid arthritis, unspecified: Secondary | ICD-10-CM | POA: Diagnosis not present

## 2017-05-31 DIAGNOSIS — I10 Essential (primary) hypertension: Secondary | ICD-10-CM | POA: Diagnosis not present

## 2017-05-31 DIAGNOSIS — Z79899 Other long term (current) drug therapy: Secondary | ICD-10-CM | POA: Diagnosis not present

## 2017-05-31 DIAGNOSIS — D509 Iron deficiency anemia, unspecified: Secondary | ICD-10-CM | POA: Diagnosis not present

## 2017-05-31 NOTE — Progress Notes (Signed)
   Subjective:    Patient ID: [name], male    DOB: [birth year], 66 y.o.   MRN: [id number] Patient arrives office with numerous concerns HPI Patient in today to discuss labs from 05/05/17 . Liver enzyme tests at that time revealed ongoing elevated alkaline phosphatase. Now it is well over 400. Discussed. No alcohol use per patient. He was unaware he had gallstones but review of chart in patient's presence day revealed tiny gallstones and workup last year. No abdominal pain.   Patient is worried about "excess fluid" he points towards a fatty area sagging under both arms and around his abdomen. No shortness of breath no chest pain. Patient has concerns of fall to back out of wheelchair,   Patient has a lesion on left arm. Worried about it. He gets actinic keratosis on the arm and now this one appears worse  and swelling to left side of face and place on right arm. Review of Systems No headache, no major weight loss or weight gain, no chest pain no back pain abdominal pain no change in bowel habits complete ROS otherwise negative     Objective:   Physical Exam Alert and oriented, vitals reviewed and stable, NAD ENT-TM's and ext canals WNL bilat via otoscopic exam Soft palate, tonsils and post pharynx WNL via oropharyngeal exam Neck-symmetric, no masses; thyroid nonpalpable and nontender Pulmonary-no tachypnea or accessory muscle use; Clear without wheezes via auscultation Card--no abnrml murmurs, rhythm reg and rate WNL Carotid pulses symmetric, without bruits  Left forearm hyperkeratotic lesion next  Ankles no edema lungs clear  Right posterior elbow some mild swelling in AN in       Assessment & Plan:  Impression 1 elbow contusion with secondary inflammation and swelling highly doubt fracture discussed  #2 left forearm lesion potentially skin cancer discuss  #3 perception excess fluid which I think is fat discussed  #4 elevated liver enzymes needs workup. We'll start  with a GGT. If positive will workup the liver. If negative will workup the bones discussed expect  Dermatology referral will be arranged also.  Greater than 50% of this 25 minute face to face visit was spent in counseling and discussion and coordination of care regarding the above diagnosis/diagnosies

## 2017-06-01 ENCOUNTER — Encounter: Payer: Self-pay | Admitting: Family Medicine

## 2017-06-01 LAB — GAMMA GT: GGT: 362 IU/L — AB (ref 0–65)

## 2017-06-07 DIAGNOSIS — E559 Vitamin D deficiency, unspecified: Secondary | ICD-10-CM | POA: Diagnosis not present

## 2017-06-07 DIAGNOSIS — D638 Anemia in other chronic diseases classified elsewhere: Secondary | ICD-10-CM | POA: Diagnosis not present

## 2017-06-07 DIAGNOSIS — N182 Chronic kidney disease, stage 2 (mild): Secondary | ICD-10-CM | POA: Diagnosis not present

## 2017-06-12 DIAGNOSIS — D225 Melanocytic nevi of trunk: Secondary | ICD-10-CM | POA: Diagnosis not present

## 2017-06-12 DIAGNOSIS — L308 Other specified dermatitis: Secondary | ICD-10-CM | POA: Diagnosis not present

## 2017-06-15 DIAGNOSIS — Z89519 Acquired absence of unspecified leg below knee: Secondary | ICD-10-CM | POA: Diagnosis not present

## 2017-06-15 DIAGNOSIS — Z89512 Acquired absence of left leg below knee: Secondary | ICD-10-CM | POA: Diagnosis not present

## 2017-06-19 NOTE — Addendum Note (Signed)
Addended by: Launa Grill on: 06/19/2017 03:59 PM   Modules accepted: Orders

## 2017-06-20 ENCOUNTER — Other Ambulatory Visit: Payer: Self-pay | Admitting: Family Medicine

## 2017-06-20 DIAGNOSIS — M0579 Rheumatoid arthritis with rheumatoid factor of multiple sites without organ or systems involvement: Secondary | ICD-10-CM | POA: Diagnosis not present

## 2017-06-20 DIAGNOSIS — G8929 Other chronic pain: Secondary | ICD-10-CM | POA: Diagnosis not present

## 2017-06-20 NOTE — Telephone Encounter (Signed)
Ok six ref 

## 2017-06-22 DIAGNOSIS — M069 Rheumatoid arthritis, unspecified: Secondary | ICD-10-CM | POA: Diagnosis not present

## 2017-06-22 DIAGNOSIS — J189 Pneumonia, unspecified organism: Secondary | ICD-10-CM | POA: Diagnosis not present

## 2017-06-22 DIAGNOSIS — L8990 Pressure ulcer of unspecified site, unspecified stage: Secondary | ICD-10-CM | POA: Diagnosis not present

## 2017-06-22 DIAGNOSIS — R7881 Bacteremia: Secondary | ICD-10-CM | POA: Diagnosis not present

## 2017-06-22 DIAGNOSIS — A419 Sepsis, unspecified organism: Secondary | ICD-10-CM | POA: Diagnosis not present

## 2017-06-22 DIAGNOSIS — L02416 Cutaneous abscess of left lower limb: Secondary | ICD-10-CM | POA: Diagnosis not present

## 2017-06-22 DIAGNOSIS — L03116 Cellulitis of left lower limb: Secondary | ICD-10-CM | POA: Diagnosis not present

## 2017-06-30 ENCOUNTER — Ambulatory Visit (HOSPITAL_COMMUNITY): Payer: Medicare HMO

## 2017-07-07 ENCOUNTER — Ambulatory Visit (HOSPITAL_COMMUNITY)
Admission: RE | Admit: 2017-07-07 | Discharge: 2017-07-07 | Disposition: A | Discharge status: Home / Self Care | Payer: Medicare HMO | Source: Ambulatory Visit | Attending: Family Medicine | Admitting: Family Medicine

## 2017-07-07 DIAGNOSIS — J90 Pleural effusion, not elsewhere classified: Secondary | ICD-10-CM | POA: Insufficient documentation

## 2017-07-07 DIAGNOSIS — S59901A Unspecified injury of right elbow, initial encounter: Secondary | ICD-10-CM | POA: Diagnosis not present

## 2017-07-07 DIAGNOSIS — R6884 Jaw pain: Secondary | ICD-10-CM | POA: Diagnosis not present

## 2017-07-07 DIAGNOSIS — Z7982 Long term (current) use of aspirin: Secondary | ICD-10-CM | POA: Diagnosis not present

## 2017-07-07 DIAGNOSIS — K8020 Calculus of gallbladder without cholecystitis without obstruction: Secondary | ICD-10-CM | POA: Insufficient documentation

## 2017-07-07 DIAGNOSIS — R220 Localized swelling, mass and lump, head: Secondary | ICD-10-CM | POA: Diagnosis not present

## 2017-07-07 DIAGNOSIS — M47812 Spondylosis without myelopathy or radiculopathy, cervical region: Secondary | ICD-10-CM | POA: Diagnosis not present

## 2017-07-07 DIAGNOSIS — E7800 Pure hypercholesterolemia, unspecified: Secondary | ICD-10-CM | POA: Diagnosis not present

## 2017-07-07 DIAGNOSIS — M25521 Pain in right elbow: Secondary | ICD-10-CM | POA: Diagnosis not present

## 2017-07-07 DIAGNOSIS — K760 Fatty (change of) liver, not elsewhere classified: Secondary | ICD-10-CM | POA: Diagnosis not present

## 2017-07-07 DIAGNOSIS — I110 Hypertensive heart disease with heart failure: Secondary | ICD-10-CM | POA: Diagnosis not present

## 2017-07-07 DIAGNOSIS — R748 Abnormal levels of other serum enzymes: Secondary | ICD-10-CM | POA: Diagnosis not present

## 2017-07-07 DIAGNOSIS — I2510 Atherosclerotic heart disease of native coronary artery without angina pectoris: Secondary | ICD-10-CM | POA: Diagnosis not present

## 2017-07-07 DIAGNOSIS — I4891 Unspecified atrial fibrillation: Secondary | ICD-10-CM | POA: Diagnosis not present

## 2017-07-07 DIAGNOSIS — E119 Type 2 diabetes mellitus without complications: Secondary | ICD-10-CM | POA: Diagnosis not present

## 2017-07-07 DIAGNOSIS — I5022 Chronic systolic (congestive) heart failure: Secondary | ICD-10-CM | POA: Diagnosis not present

## 2017-07-07 DIAGNOSIS — R221 Localized swelling, mass and lump, neck: Secondary | ICD-10-CM | POA: Diagnosis not present

## 2017-07-10 ENCOUNTER — Telehealth: Payer: Self-pay | Admitting: Family Medicine

## 2017-07-10 DIAGNOSIS — Z89422 Acquired absence of other left toe(s): Secondary | ICD-10-CM

## 2017-07-10 NOTE — Telephone Encounter (Signed)
[name] called to request referral to [place] for PT  Pt now has his prosthesis  Please advise & initiate referral in system so that I may process

## 2017-07-10 NOTE — Telephone Encounter (Signed)
I called and left a vm asked that r/c. Also another note in result notes to make pt aware of.

## 2017-07-10 NOTE — Telephone Encounter (Signed)
[place] do. Also see ultrasound report and my resoinse so you an let pt know

## 2017-07-12 ENCOUNTER — Telehealth: Payer: Self-pay | Admitting: Family Medicine

## 2017-07-12 ENCOUNTER — Encounter: Payer: Self-pay | Admitting: Family Medicine

## 2017-07-12 ENCOUNTER — Ambulatory Visit (INDEPENDENT_AMBULATORY_CARE_PROVIDER_SITE_OTHER): Payer: Medicare HMO | Admitting: Family Medicine

## 2017-07-12 VITALS — BP 128/74 | Ht 68.0 in

## 2017-07-12 DIAGNOSIS — M05712 Rheumatoid arthritis with rheumatoid factor of left shoulder without organ or systems involvement: Secondary | ICD-10-CM | POA: Diagnosis not present

## 2017-07-12 DIAGNOSIS — M069 Rheumatoid arthritis, unspecified: Secondary | ICD-10-CM

## 2017-07-12 DIAGNOSIS — Z794 Long term (current) use of insulin: Secondary | ICD-10-CM | POA: Diagnosis not present

## 2017-07-12 DIAGNOSIS — IMO0001 Reserved for inherently not codable concepts without codable children: Secondary | ICD-10-CM

## 2017-07-12 DIAGNOSIS — I2510 Atherosclerotic heart disease of native coronary artery without angina pectoris: Secondary | ICD-10-CM

## 2017-07-12 DIAGNOSIS — I10 Essential (primary) hypertension: Secondary | ICD-10-CM | POA: Diagnosis not present

## 2017-07-12 DIAGNOSIS — M05711 Rheumatoid arthritis with rheumatoid factor of right shoulder without organ or systems involvement: Secondary | ICD-10-CM | POA: Diagnosis not present

## 2017-07-12 DIAGNOSIS — E119 Type 2 diabetes mellitus without complications: Secondary | ICD-10-CM | POA: Diagnosis not present

## 2017-07-12 LAB — POCT GLYCOSYLATED HEMOGLOBIN (HGB A1C): HEMOGLOBIN A1C: 5.4

## 2017-07-12 MED ORDER — INSULIN ASPART PROT & ASPART (70-30 MIX) 100 UNIT/ML ~~LOC~~ SUSP: SUBCUTANEOUS | 2 refills | Status: DC

## 2017-07-12 NOTE — Telephone Encounter (Signed)
Script wrote for wheelchair and faxed to [facility]. Fax number [phone]. Left message to return call to notify pt.

## 2017-07-12 NOTE — Progress Notes (Addendum)
   Subjective:    Patient ID: [name], male    DOB: 1951/11/16, 66 y.o.   MRN: [id number]  Diabetes  He presents for his follow-up diabetic visit. He has type 2 diabetes mellitus. He has not had a previous visit with a dietitian. He does not see a podiatrist.Eye exam is not current.   Results for orders placed or performed in visit on 07/12/17  POCT HgB A1C  Result Value Ref Range   Hemoglobin A1C 5.4    Patient claims compliance with diabetes medication. No obvious side effects. Reports no substantial low sugar spells. Most numbers are generally in good range when checked fasting. Generally does not miss a dose of medication. Watching diabetic diet closely  Blood pressure medicine and blood pressure levels reviewed today with patient. Compliant with blood pressure medicine. States does not miss a dose. No obvious side effects. Blood pressure generally good when checked elsewhere. Watching salt intake.    Patient would like to discuss ultrasound of liver. Please see results. Discussed. Gallstones were present but no inflammation. Architecturally liver which could. Persistent effusion at right base after last falls challenging loculated effusion which required tapping Review of Systems No headache, no major weight loss or weight gain, no chest pain no back pain abdominal pain no change in bowel habits complete ROS otherwise negative     Objective:   Physical Exam Alert and oriented, vitals reviewed and stable, NAD ENT-TM's and ext canals WNL bilat via otoscopic exam Soft palate, tonsils and post pharynx WNL via oropharyngeal exam Neck-symmetric, no masses; thyroid nonpalpable and nontender Pulmonary-no tachypnea or accessory muscle use; Clear without wheezes via auscultation Card--no abnrml murmurs, rhythm reg and rate WNL Carotid pulses symmetric, without bruits Status post amputation/stump appears well-healed         Assessment & Plan:  Impression 1 hyperlipidemia prior  blood work discussed maintain same #2 hypertension good control discussed maintain same #3 prostate hypertrophy stable on Flomax patient would like to maintain #4 coronary artery disease clinically stable #5 rheumatoid arthritis followed by specialist #6 status post amputatio  Referral to physical therapy since patient receiving a new prosthesis and needs assessment and training. Also skilled nursing referral for general assessment and diabetes education.WSL

## 2017-07-12 NOTE — Telephone Encounter (Signed)
[name] at [facility] states they never received order for wheelchair and they do fill orders for [employer]. Pt needs to use [facility] for wheelchair order.

## 2017-07-12 NOTE — Telephone Encounter (Signed)
Per Dr.Steve Luking call [employer] and get the status of electronic wheelchair. We sent prescription for it a few months ago and the patient nor our office has heard anything regarding the status.

## 2017-07-19 ENCOUNTER — Ambulatory Visit (INDEPENDENT_AMBULATORY_CARE_PROVIDER_SITE_OTHER): Payer: Medicare HMO | Admitting: Urology

## 2017-07-19 DIAGNOSIS — R338 Other retention of urine: Secondary | ICD-10-CM

## 2017-07-19 DIAGNOSIS — N401 Enlarged prostate with lower urinary tract symptoms: Secondary | ICD-10-CM | POA: Diagnosis not present

## 2017-07-21 NOTE — Telephone Encounter (Signed)
Spoke with patient's wife and informed her that prescription for wheelchair was faxed to [place]. Patient's wife verbalized understanding.

## 2017-07-21 NOTE — Telephone Encounter (Signed)
See result note 07/21/17

## 2017-07-21 NOTE — Telephone Encounter (Signed)
Left message return call 07/21/17

## 2017-07-22 DIAGNOSIS — L02416 Cutaneous abscess of left lower limb: Secondary | ICD-10-CM | POA: Diagnosis not present

## 2017-07-22 DIAGNOSIS — A419 Sepsis, unspecified organism: Secondary | ICD-10-CM | POA: Diagnosis not present

## 2017-07-22 DIAGNOSIS — L03116 Cellulitis of left lower limb: Secondary | ICD-10-CM | POA: Diagnosis not present

## 2017-07-22 DIAGNOSIS — M069 Rheumatoid arthritis, unspecified: Secondary | ICD-10-CM | POA: Diagnosis not present

## 2017-07-22 DIAGNOSIS — L8990 Pressure ulcer of unspecified site, unspecified stage: Secondary | ICD-10-CM | POA: Diagnosis not present

## 2017-07-22 DIAGNOSIS — J189 Pneumonia, unspecified organism: Secondary | ICD-10-CM | POA: Diagnosis not present

## 2017-07-22 DIAGNOSIS — R7881 Bacteremia: Secondary | ICD-10-CM | POA: Diagnosis not present

## 2017-07-24 ENCOUNTER — Telehealth: Payer: Self-pay | Admitting: Family Medicine

## 2017-07-24 NOTE — Telephone Encounter (Signed)
OV note needs addendum for home health order, please see note below from [facility], [facility], [place]  Please have Dr. Mickie Hillier amend his OVN of 07/12/17 to mention need for [facility] PT due to new prosthesis and SN for general assessment and DM general education and this note will act as the F2F encounter. Then I can refax so they will see him tomorrow. Thank you Izora Ribas!

## 2017-07-27 DIAGNOSIS — Z794 Long term (current) use of insulin: Secondary | ICD-10-CM | POA: Diagnosis not present

## 2017-07-27 DIAGNOSIS — N400 Enlarged prostate without lower urinary tract symptoms: Secondary | ICD-10-CM | POA: Diagnosis not present

## 2017-07-27 DIAGNOSIS — I509 Heart failure, unspecified: Secondary | ICD-10-CM | POA: Diagnosis not present

## 2017-07-27 DIAGNOSIS — Z960 Presence of urogenital implants: Secondary | ICD-10-CM | POA: Diagnosis not present

## 2017-07-27 DIAGNOSIS — I110 Hypertensive heart disease with heart failure: Secondary | ICD-10-CM | POA: Diagnosis not present

## 2017-07-27 DIAGNOSIS — M069 Rheumatoid arthritis, unspecified: Secondary | ICD-10-CM | POA: Diagnosis not present

## 2017-07-27 DIAGNOSIS — E119 Type 2 diabetes mellitus without complications: Secondary | ICD-10-CM | POA: Diagnosis not present

## 2017-07-27 DIAGNOSIS — Z7982 Long term (current) use of aspirin: Secondary | ICD-10-CM | POA: Diagnosis not present

## 2017-07-27 DIAGNOSIS — Z89512 Acquired absence of left leg below knee: Secondary | ICD-10-CM | POA: Diagnosis not present

## 2017-07-28 DIAGNOSIS — M069 Rheumatoid arthritis, unspecified: Secondary | ICD-10-CM | POA: Diagnosis not present

## 2017-07-28 DIAGNOSIS — Z89512 Acquired absence of left leg below knee: Secondary | ICD-10-CM | POA: Diagnosis not present

## 2017-07-28 DIAGNOSIS — N400 Enlarged prostate without lower urinary tract symptoms: Secondary | ICD-10-CM | POA: Diagnosis not present

## 2017-07-28 DIAGNOSIS — Z794 Long term (current) use of insulin: Secondary | ICD-10-CM | POA: Diagnosis not present

## 2017-07-28 DIAGNOSIS — Z960 Presence of urogenital implants: Secondary | ICD-10-CM | POA: Diagnosis not present

## 2017-07-28 DIAGNOSIS — Z7982 Long term (current) use of aspirin: Secondary | ICD-10-CM | POA: Diagnosis not present

## 2017-07-28 DIAGNOSIS — I110 Hypertensive heart disease with heart failure: Secondary | ICD-10-CM | POA: Diagnosis not present

## 2017-07-28 DIAGNOSIS — E119 Type 2 diabetes mellitus without complications: Secondary | ICD-10-CM | POA: Diagnosis not present

## 2017-07-28 DIAGNOSIS — I509 Heart failure, unspecified: Secondary | ICD-10-CM | POA: Diagnosis not present

## 2017-07-31 ENCOUNTER — Telehealth: Payer: Self-pay | Admitting: Family Medicine

## 2017-07-31 DIAGNOSIS — Z794 Long term (current) use of insulin: Secondary | ICD-10-CM | POA: Diagnosis not present

## 2017-07-31 DIAGNOSIS — M069 Rheumatoid arthritis, unspecified: Secondary | ICD-10-CM | POA: Diagnosis not present

## 2017-07-31 DIAGNOSIS — I509 Heart failure, unspecified: Secondary | ICD-10-CM | POA: Diagnosis not present

## 2017-07-31 DIAGNOSIS — I110 Hypertensive heart disease with heart failure: Secondary | ICD-10-CM | POA: Diagnosis not present

## 2017-07-31 DIAGNOSIS — Z89512 Acquired absence of left leg below knee: Secondary | ICD-10-CM | POA: Diagnosis not present

## 2017-07-31 DIAGNOSIS — E119 Type 2 diabetes mellitus without complications: Secondary | ICD-10-CM | POA: Diagnosis not present

## 2017-07-31 DIAGNOSIS — Z960 Presence of urogenital implants: Secondary | ICD-10-CM | POA: Diagnosis not present

## 2017-07-31 DIAGNOSIS — N400 Enlarged prostate without lower urinary tract symptoms: Secondary | ICD-10-CM | POA: Diagnosis not present

## 2017-07-31 DIAGNOSIS — Z7982 Long term (current) use of aspirin: Secondary | ICD-10-CM | POA: Diagnosis not present

## 2017-07-31 NOTE — Telephone Encounter (Signed)
Requesting order for walker with bilateral platform.

## 2017-08-01 ENCOUNTER — Other Ambulatory Visit: Payer: Self-pay | Admitting: Family Medicine

## 2017-08-01 NOTE — Telephone Encounter (Signed)
Patient is working on standing.  They are working on prosthesis and Raquel Sarna thinks the platform can help.  She is hoping for a call back soon.

## 2017-08-01 NOTE — Telephone Encounter (Signed)
Left verbal ok on [name]'s voicemail and message for her to call back with fax number if she needs a written order

## 2017-08-01 NOTE — Telephone Encounter (Signed)
Yes lets order ill sign

## 2017-08-03 DIAGNOSIS — E119 Type 2 diabetes mellitus without complications: Secondary | ICD-10-CM | POA: Diagnosis not present

## 2017-08-03 DIAGNOSIS — Z89512 Acquired absence of left leg below knee: Secondary | ICD-10-CM | POA: Diagnosis not present

## 2017-08-03 DIAGNOSIS — Z960 Presence of urogenital implants: Secondary | ICD-10-CM | POA: Diagnosis not present

## 2017-08-03 DIAGNOSIS — N400 Enlarged prostate without lower urinary tract symptoms: Secondary | ICD-10-CM | POA: Diagnosis not present

## 2017-08-03 DIAGNOSIS — I509 Heart failure, unspecified: Secondary | ICD-10-CM | POA: Diagnosis not present

## 2017-08-03 DIAGNOSIS — M069 Rheumatoid arthritis, unspecified: Secondary | ICD-10-CM | POA: Diagnosis not present

## 2017-08-03 DIAGNOSIS — Z794 Long term (current) use of insulin: Secondary | ICD-10-CM | POA: Diagnosis not present

## 2017-08-03 DIAGNOSIS — Z7982 Long term (current) use of aspirin: Secondary | ICD-10-CM | POA: Diagnosis not present

## 2017-08-03 DIAGNOSIS — I110 Hypertensive heart disease with heart failure: Secondary | ICD-10-CM | POA: Diagnosis not present

## 2017-08-04 DIAGNOSIS — I110 Hypertensive heart disease with heart failure: Secondary | ICD-10-CM | POA: Diagnosis not present

## 2017-08-04 DIAGNOSIS — N400 Enlarged prostate without lower urinary tract symptoms: Secondary | ICD-10-CM | POA: Diagnosis not present

## 2017-08-04 DIAGNOSIS — Z89512 Acquired absence of left leg below knee: Secondary | ICD-10-CM | POA: Diagnosis not present

## 2017-08-04 DIAGNOSIS — Z960 Presence of urogenital implants: Secondary | ICD-10-CM | POA: Diagnosis not present

## 2017-08-04 DIAGNOSIS — Z7982 Long term (current) use of aspirin: Secondary | ICD-10-CM | POA: Diagnosis not present

## 2017-08-04 DIAGNOSIS — I509 Heart failure, unspecified: Secondary | ICD-10-CM | POA: Diagnosis not present

## 2017-08-04 DIAGNOSIS — E119 Type 2 diabetes mellitus without complications: Secondary | ICD-10-CM | POA: Diagnosis not present

## 2017-08-04 DIAGNOSIS — Z794 Long term (current) use of insulin: Secondary | ICD-10-CM | POA: Diagnosis not present

## 2017-08-04 DIAGNOSIS — M069 Rheumatoid arthritis, unspecified: Secondary | ICD-10-CM | POA: Diagnosis not present

## 2017-08-07 DIAGNOSIS — M069 Rheumatoid arthritis, unspecified: Secondary | ICD-10-CM | POA: Diagnosis not present

## 2017-08-07 DIAGNOSIS — Z794 Long term (current) use of insulin: Secondary | ICD-10-CM | POA: Diagnosis not present

## 2017-08-07 DIAGNOSIS — N400 Enlarged prostate without lower urinary tract symptoms: Secondary | ICD-10-CM | POA: Diagnosis not present

## 2017-08-07 DIAGNOSIS — Z7982 Long term (current) use of aspirin: Secondary | ICD-10-CM | POA: Diagnosis not present

## 2017-08-07 DIAGNOSIS — Z89512 Acquired absence of left leg below knee: Secondary | ICD-10-CM | POA: Diagnosis not present

## 2017-08-07 DIAGNOSIS — I509 Heart failure, unspecified: Secondary | ICD-10-CM | POA: Diagnosis not present

## 2017-08-07 DIAGNOSIS — I110 Hypertensive heart disease with heart failure: Secondary | ICD-10-CM | POA: Diagnosis not present

## 2017-08-07 DIAGNOSIS — E119 Type 2 diabetes mellitus without complications: Secondary | ICD-10-CM | POA: Diagnosis not present

## 2017-08-07 DIAGNOSIS — Z960 Presence of urogenital implants: Secondary | ICD-10-CM | POA: Diagnosis not present

## 2017-08-07 NOTE — Telephone Encounter (Signed)
Please see note below for needed documentation for home health order, please advise

## 2017-08-08 NOTE — Telephone Encounter (Signed)
Done (addendum)

## 2017-08-09 DIAGNOSIS — Z794 Long term (current) use of insulin: Secondary | ICD-10-CM | POA: Diagnosis not present

## 2017-08-09 DIAGNOSIS — Z7982 Long term (current) use of aspirin: Secondary | ICD-10-CM | POA: Diagnosis not present

## 2017-08-09 DIAGNOSIS — L02416 Cutaneous abscess of left lower limb: Secondary | ICD-10-CM | POA: Diagnosis not present

## 2017-08-09 DIAGNOSIS — Z960 Presence of urogenital implants: Secondary | ICD-10-CM | POA: Diagnosis not present

## 2017-08-09 DIAGNOSIS — E119 Type 2 diabetes mellitus without complications: Secondary | ICD-10-CM | POA: Diagnosis not present

## 2017-08-09 DIAGNOSIS — L8990 Pressure ulcer of unspecified site, unspecified stage: Secondary | ICD-10-CM | POA: Diagnosis not present

## 2017-08-09 DIAGNOSIS — A419 Sepsis, unspecified organism: Secondary | ICD-10-CM | POA: Diagnosis not present

## 2017-08-09 DIAGNOSIS — J189 Pneumonia, unspecified organism: Secondary | ICD-10-CM | POA: Diagnosis not present

## 2017-08-09 DIAGNOSIS — R7881 Bacteremia: Secondary | ICD-10-CM | POA: Diagnosis not present

## 2017-08-09 DIAGNOSIS — N400 Enlarged prostate without lower urinary tract symptoms: Secondary | ICD-10-CM | POA: Diagnosis not present

## 2017-08-09 DIAGNOSIS — L03116 Cellulitis of left lower limb: Secondary | ICD-10-CM | POA: Diagnosis not present

## 2017-08-09 DIAGNOSIS — I5043 Acute on chronic combined systolic (congestive) and diastolic (congestive) heart failure: Secondary | ICD-10-CM | POA: Diagnosis not present

## 2017-08-09 DIAGNOSIS — I110 Hypertensive heart disease with heart failure: Secondary | ICD-10-CM | POA: Diagnosis not present

## 2017-08-09 DIAGNOSIS — Z89512 Acquired absence of left leg below knee: Secondary | ICD-10-CM | POA: Diagnosis not present

## 2017-08-09 DIAGNOSIS — M069 Rheumatoid arthritis, unspecified: Secondary | ICD-10-CM | POA: Diagnosis not present

## 2017-08-09 DIAGNOSIS — I509 Heart failure, unspecified: Secondary | ICD-10-CM | POA: Diagnosis not present

## 2017-08-11 DIAGNOSIS — I509 Heart failure, unspecified: Secondary | ICD-10-CM | POA: Diagnosis not present

## 2017-08-11 DIAGNOSIS — Z7982 Long term (current) use of aspirin: Secondary | ICD-10-CM | POA: Diagnosis not present

## 2017-08-11 DIAGNOSIS — Z960 Presence of urogenital implants: Secondary | ICD-10-CM | POA: Diagnosis not present

## 2017-08-11 DIAGNOSIS — Z794 Long term (current) use of insulin: Secondary | ICD-10-CM | POA: Diagnosis not present

## 2017-08-11 DIAGNOSIS — E119 Type 2 diabetes mellitus without complications: Secondary | ICD-10-CM | POA: Diagnosis not present

## 2017-08-11 DIAGNOSIS — I110 Hypertensive heart disease with heart failure: Secondary | ICD-10-CM | POA: Diagnosis not present

## 2017-08-11 DIAGNOSIS — N400 Enlarged prostate without lower urinary tract symptoms: Secondary | ICD-10-CM | POA: Diagnosis not present

## 2017-08-11 DIAGNOSIS — Z89512 Acquired absence of left leg below knee: Secondary | ICD-10-CM | POA: Diagnosis not present

## 2017-08-11 DIAGNOSIS — M069 Rheumatoid arthritis, unspecified: Secondary | ICD-10-CM | POA: Diagnosis not present

## 2017-08-14 ENCOUNTER — Telehealth: Payer: Self-pay | Admitting: Family Medicine

## 2017-08-14 DIAGNOSIS — Z794 Long term (current) use of insulin: Secondary | ICD-10-CM | POA: Diagnosis not present

## 2017-08-14 DIAGNOSIS — E119 Type 2 diabetes mellitus without complications: Secondary | ICD-10-CM | POA: Diagnosis not present

## 2017-08-14 DIAGNOSIS — I110 Hypertensive heart disease with heart failure: Secondary | ICD-10-CM | POA: Diagnosis not present

## 2017-08-14 DIAGNOSIS — Z960 Presence of urogenital implants: Secondary | ICD-10-CM | POA: Diagnosis not present

## 2017-08-14 DIAGNOSIS — N400 Enlarged prostate without lower urinary tract symptoms: Secondary | ICD-10-CM | POA: Diagnosis not present

## 2017-08-14 DIAGNOSIS — Z89512 Acquired absence of left leg below knee: Secondary | ICD-10-CM | POA: Diagnosis not present

## 2017-08-14 DIAGNOSIS — Z7982 Long term (current) use of aspirin: Secondary | ICD-10-CM | POA: Diagnosis not present

## 2017-08-14 DIAGNOSIS — M069 Rheumatoid arthritis, unspecified: Secondary | ICD-10-CM | POA: Diagnosis not present

## 2017-08-14 DIAGNOSIS — I509 Heart failure, unspecified: Secondary | ICD-10-CM | POA: Diagnosis not present

## 2017-08-14 NOTE — Telephone Encounter (Signed)
Requesting to change frequency of PT to 3 times weekly for the next couple of weeks.  Patient has started to show improvement with walking.

## 2017-08-15 NOTE — Telephone Encounter (Signed)
Spoke with Suezanne Jacquet at [facility] and gave verbal orders per Dr.Steve Luking- to increase PT for 3 times per week for the next few weeks. Ben verbalized understanding.

## 2017-08-15 NOTE — Telephone Encounter (Signed)
ok 

## 2017-08-15 NOTE — Telephone Encounter (Signed)
Left message return call 08/15/17

## 2017-08-16 DIAGNOSIS — Z7982 Long term (current) use of aspirin: Secondary | ICD-10-CM | POA: Diagnosis not present

## 2017-08-16 DIAGNOSIS — Z89512 Acquired absence of left leg below knee: Secondary | ICD-10-CM | POA: Diagnosis not present

## 2017-08-16 DIAGNOSIS — I110 Hypertensive heart disease with heart failure: Secondary | ICD-10-CM | POA: Diagnosis not present

## 2017-08-16 DIAGNOSIS — N400 Enlarged prostate without lower urinary tract symptoms: Secondary | ICD-10-CM | POA: Diagnosis not present

## 2017-08-16 DIAGNOSIS — Z794 Long term (current) use of insulin: Secondary | ICD-10-CM | POA: Diagnosis not present

## 2017-08-16 DIAGNOSIS — Z960 Presence of urogenital implants: Secondary | ICD-10-CM | POA: Diagnosis not present

## 2017-08-16 DIAGNOSIS — E119 Type 2 diabetes mellitus without complications: Secondary | ICD-10-CM | POA: Diagnosis not present

## 2017-08-16 DIAGNOSIS — I509 Heart failure, unspecified: Secondary | ICD-10-CM | POA: Diagnosis not present

## 2017-08-16 DIAGNOSIS — M069 Rheumatoid arthritis, unspecified: Secondary | ICD-10-CM | POA: Diagnosis not present

## 2017-08-17 DIAGNOSIS — N400 Enlarged prostate without lower urinary tract symptoms: Secondary | ICD-10-CM | POA: Diagnosis not present

## 2017-08-17 DIAGNOSIS — E119 Type 2 diabetes mellitus without complications: Secondary | ICD-10-CM | POA: Diagnosis not present

## 2017-08-17 DIAGNOSIS — I509 Heart failure, unspecified: Secondary | ICD-10-CM | POA: Diagnosis not present

## 2017-08-17 DIAGNOSIS — Z89512 Acquired absence of left leg below knee: Secondary | ICD-10-CM | POA: Diagnosis not present

## 2017-08-17 DIAGNOSIS — I110 Hypertensive heart disease with heart failure: Secondary | ICD-10-CM | POA: Diagnosis not present

## 2017-08-17 DIAGNOSIS — Z7982 Long term (current) use of aspirin: Secondary | ICD-10-CM | POA: Diagnosis not present

## 2017-08-17 DIAGNOSIS — Z794 Long term (current) use of insulin: Secondary | ICD-10-CM | POA: Diagnosis not present

## 2017-08-17 DIAGNOSIS — M069 Rheumatoid arthritis, unspecified: Secondary | ICD-10-CM | POA: Diagnosis not present

## 2017-08-17 DIAGNOSIS — Z960 Presence of urogenital implants: Secondary | ICD-10-CM | POA: Diagnosis not present

## 2017-08-21 DIAGNOSIS — N400 Enlarged prostate without lower urinary tract symptoms: Secondary | ICD-10-CM | POA: Diagnosis not present

## 2017-08-21 DIAGNOSIS — M069 Rheumatoid arthritis, unspecified: Secondary | ICD-10-CM | POA: Diagnosis not present

## 2017-08-21 DIAGNOSIS — I110 Hypertensive heart disease with heart failure: Secondary | ICD-10-CM | POA: Diagnosis not present

## 2017-08-21 DIAGNOSIS — E119 Type 2 diabetes mellitus without complications: Secondary | ICD-10-CM | POA: Diagnosis not present

## 2017-08-21 DIAGNOSIS — Z7982 Long term (current) use of aspirin: Secondary | ICD-10-CM | POA: Diagnosis not present

## 2017-08-21 DIAGNOSIS — Z960 Presence of urogenital implants: Secondary | ICD-10-CM | POA: Diagnosis not present

## 2017-08-21 DIAGNOSIS — Z794 Long term (current) use of insulin: Secondary | ICD-10-CM | POA: Diagnosis not present

## 2017-08-21 DIAGNOSIS — Z89512 Acquired absence of left leg below knee: Secondary | ICD-10-CM | POA: Diagnosis not present

## 2017-08-21 DIAGNOSIS — I509 Heart failure, unspecified: Secondary | ICD-10-CM | POA: Diagnosis not present

## 2017-08-22 ENCOUNTER — Other Ambulatory Visit: Payer: Self-pay | Admitting: Family Medicine

## 2017-08-22 DIAGNOSIS — L03116 Cellulitis of left lower limb: Secondary | ICD-10-CM | POA: Diagnosis not present

## 2017-08-22 DIAGNOSIS — Z89512 Acquired absence of left leg below knee: Secondary | ICD-10-CM | POA: Diagnosis not present

## 2017-08-22 DIAGNOSIS — N400 Enlarged prostate without lower urinary tract symptoms: Secondary | ICD-10-CM | POA: Diagnosis not present

## 2017-08-22 DIAGNOSIS — I509 Heart failure, unspecified: Secondary | ICD-10-CM | POA: Diagnosis not present

## 2017-08-22 DIAGNOSIS — Z960 Presence of urogenital implants: Secondary | ICD-10-CM | POA: Diagnosis not present

## 2017-08-22 DIAGNOSIS — Z794 Long term (current) use of insulin: Secondary | ICD-10-CM | POA: Diagnosis not present

## 2017-08-22 DIAGNOSIS — I110 Hypertensive heart disease with heart failure: Secondary | ICD-10-CM | POA: Diagnosis not present

## 2017-08-22 DIAGNOSIS — E119 Type 2 diabetes mellitus without complications: Secondary | ICD-10-CM | POA: Diagnosis not present

## 2017-08-22 DIAGNOSIS — Z7982 Long term (current) use of aspirin: Secondary | ICD-10-CM | POA: Diagnosis not present

## 2017-08-22 DIAGNOSIS — A419 Sepsis, unspecified organism: Secondary | ICD-10-CM | POA: Diagnosis not present

## 2017-08-22 DIAGNOSIS — M069 Rheumatoid arthritis, unspecified: Secondary | ICD-10-CM | POA: Diagnosis not present

## 2017-08-22 DIAGNOSIS — R7881 Bacteremia: Secondary | ICD-10-CM | POA: Diagnosis not present

## 2017-08-22 DIAGNOSIS — L8990 Pressure ulcer of unspecified site, unspecified stage: Secondary | ICD-10-CM | POA: Diagnosis not present

## 2017-08-22 DIAGNOSIS — J189 Pneumonia, unspecified organism: Secondary | ICD-10-CM | POA: Diagnosis not present

## 2017-08-22 DIAGNOSIS — L02416 Cutaneous abscess of left lower limb: Secondary | ICD-10-CM | POA: Diagnosis not present

## 2017-08-22 NOTE — Telephone Encounter (Signed)
Last seen 07/12/17

## 2017-08-23 ENCOUNTER — Ambulatory Visit: Payer: Medicare HMO | Admitting: Urology

## 2017-08-25 DIAGNOSIS — I110 Hypertensive heart disease with heart failure: Secondary | ICD-10-CM | POA: Diagnosis not present

## 2017-08-25 DIAGNOSIS — M069 Rheumatoid arthritis, unspecified: Secondary | ICD-10-CM | POA: Diagnosis not present

## 2017-08-25 DIAGNOSIS — Z7982 Long term (current) use of aspirin: Secondary | ICD-10-CM | POA: Diagnosis not present

## 2017-08-25 DIAGNOSIS — Z89512 Acquired absence of left leg below knee: Secondary | ICD-10-CM | POA: Diagnosis not present

## 2017-08-25 DIAGNOSIS — N400 Enlarged prostate without lower urinary tract symptoms: Secondary | ICD-10-CM | POA: Diagnosis not present

## 2017-08-25 DIAGNOSIS — E119 Type 2 diabetes mellitus without complications: Secondary | ICD-10-CM | POA: Diagnosis not present

## 2017-08-25 DIAGNOSIS — Z794 Long term (current) use of insulin: Secondary | ICD-10-CM | POA: Diagnosis not present

## 2017-08-25 DIAGNOSIS — I509 Heart failure, unspecified: Secondary | ICD-10-CM | POA: Diagnosis not present

## 2017-08-25 DIAGNOSIS — Z960 Presence of urogenital implants: Secondary | ICD-10-CM | POA: Diagnosis not present

## 2017-08-29 ENCOUNTER — Other Ambulatory Visit: Payer: Self-pay | Admitting: Family Medicine

## 2017-08-30 ENCOUNTER — Ambulatory Visit: Payer: Medicare HMO | Admitting: Urology

## 2017-08-30 DIAGNOSIS — Z794 Long term (current) use of insulin: Secondary | ICD-10-CM | POA: Diagnosis not present

## 2017-08-30 DIAGNOSIS — N400 Enlarged prostate without lower urinary tract symptoms: Secondary | ICD-10-CM | POA: Diagnosis not present

## 2017-08-30 DIAGNOSIS — I509 Heart failure, unspecified: Secondary | ICD-10-CM | POA: Diagnosis not present

## 2017-08-30 DIAGNOSIS — I110 Hypertensive heart disease with heart failure: Secondary | ICD-10-CM | POA: Diagnosis not present

## 2017-08-30 DIAGNOSIS — M069 Rheumatoid arthritis, unspecified: Secondary | ICD-10-CM | POA: Diagnosis not present

## 2017-08-30 DIAGNOSIS — Z960 Presence of urogenital implants: Secondary | ICD-10-CM | POA: Diagnosis not present

## 2017-08-30 DIAGNOSIS — Z7982 Long term (current) use of aspirin: Secondary | ICD-10-CM | POA: Diagnosis not present

## 2017-08-30 DIAGNOSIS — Z89512 Acquired absence of left leg below knee: Secondary | ICD-10-CM | POA: Diagnosis not present

## 2017-08-30 DIAGNOSIS — E119 Type 2 diabetes mellitus without complications: Secondary | ICD-10-CM | POA: Diagnosis not present

## 2017-09-01 DIAGNOSIS — Z89512 Acquired absence of left leg below knee: Secondary | ICD-10-CM | POA: Diagnosis not present

## 2017-09-01 DIAGNOSIS — Z794 Long term (current) use of insulin: Secondary | ICD-10-CM | POA: Diagnosis not present

## 2017-09-01 DIAGNOSIS — E119 Type 2 diabetes mellitus without complications: Secondary | ICD-10-CM | POA: Diagnosis not present

## 2017-09-01 DIAGNOSIS — Z7982 Long term (current) use of aspirin: Secondary | ICD-10-CM | POA: Diagnosis not present

## 2017-09-01 DIAGNOSIS — I509 Heart failure, unspecified: Secondary | ICD-10-CM | POA: Diagnosis not present

## 2017-09-01 DIAGNOSIS — M069 Rheumatoid arthritis, unspecified: Secondary | ICD-10-CM | POA: Diagnosis not present

## 2017-09-01 DIAGNOSIS — Z960 Presence of urogenital implants: Secondary | ICD-10-CM | POA: Diagnosis not present

## 2017-09-01 DIAGNOSIS — I110 Hypertensive heart disease with heart failure: Secondary | ICD-10-CM | POA: Diagnosis not present

## 2017-09-01 DIAGNOSIS — N400 Enlarged prostate without lower urinary tract symptoms: Secondary | ICD-10-CM | POA: Diagnosis not present

## 2017-09-04 DIAGNOSIS — Z960 Presence of urogenital implants: Secondary | ICD-10-CM | POA: Diagnosis not present

## 2017-09-04 DIAGNOSIS — I509 Heart failure, unspecified: Secondary | ICD-10-CM | POA: Diagnosis not present

## 2017-09-04 DIAGNOSIS — E119 Type 2 diabetes mellitus without complications: Secondary | ICD-10-CM | POA: Diagnosis not present

## 2017-09-04 DIAGNOSIS — M069 Rheumatoid arthritis, unspecified: Secondary | ICD-10-CM | POA: Diagnosis not present

## 2017-09-04 DIAGNOSIS — Z7982 Long term (current) use of aspirin: Secondary | ICD-10-CM | POA: Diagnosis not present

## 2017-09-04 DIAGNOSIS — Z89512 Acquired absence of left leg below knee: Secondary | ICD-10-CM | POA: Diagnosis not present

## 2017-09-04 DIAGNOSIS — Z794 Long term (current) use of insulin: Secondary | ICD-10-CM | POA: Diagnosis not present

## 2017-09-04 DIAGNOSIS — I110 Hypertensive heart disease with heart failure: Secondary | ICD-10-CM | POA: Diagnosis not present

## 2017-09-04 DIAGNOSIS — N400 Enlarged prostate without lower urinary tract symptoms: Secondary | ICD-10-CM | POA: Diagnosis not present

## 2017-09-06 ENCOUNTER — Ambulatory Visit: Payer: Medicare HMO | Admitting: Urology

## 2017-09-07 DIAGNOSIS — Z960 Presence of urogenital implants: Secondary | ICD-10-CM | POA: Diagnosis not present

## 2017-09-07 DIAGNOSIS — Z794 Long term (current) use of insulin: Secondary | ICD-10-CM | POA: Diagnosis not present

## 2017-09-07 DIAGNOSIS — Z89512 Acquired absence of left leg below knee: Secondary | ICD-10-CM | POA: Diagnosis not present

## 2017-09-07 DIAGNOSIS — E119 Type 2 diabetes mellitus without complications: Secondary | ICD-10-CM | POA: Diagnosis not present

## 2017-09-07 DIAGNOSIS — M069 Rheumatoid arthritis, unspecified: Secondary | ICD-10-CM | POA: Diagnosis not present

## 2017-09-07 DIAGNOSIS — Z7982 Long term (current) use of aspirin: Secondary | ICD-10-CM | POA: Diagnosis not present

## 2017-09-07 DIAGNOSIS — N400 Enlarged prostate without lower urinary tract symptoms: Secondary | ICD-10-CM | POA: Diagnosis not present

## 2017-09-07 DIAGNOSIS — I110 Hypertensive heart disease with heart failure: Secondary | ICD-10-CM | POA: Diagnosis not present

## 2017-09-07 DIAGNOSIS — I509 Heart failure, unspecified: Secondary | ICD-10-CM | POA: Diagnosis not present

## 2017-09-12 DIAGNOSIS — G8929 Other chronic pain: Secondary | ICD-10-CM | POA: Diagnosis not present

## 2017-09-14 DIAGNOSIS — Z794 Long term (current) use of insulin: Secondary | ICD-10-CM | POA: Diagnosis not present

## 2017-09-14 DIAGNOSIS — Z89512 Acquired absence of left leg below knee: Secondary | ICD-10-CM | POA: Diagnosis not present

## 2017-09-14 DIAGNOSIS — Z960 Presence of urogenital implants: Secondary | ICD-10-CM | POA: Diagnosis not present

## 2017-09-14 DIAGNOSIS — N400 Enlarged prostate without lower urinary tract symptoms: Secondary | ICD-10-CM | POA: Diagnosis not present

## 2017-09-14 DIAGNOSIS — I110 Hypertensive heart disease with heart failure: Secondary | ICD-10-CM | POA: Diagnosis not present

## 2017-09-14 DIAGNOSIS — M069 Rheumatoid arthritis, unspecified: Secondary | ICD-10-CM | POA: Diagnosis not present

## 2017-09-14 DIAGNOSIS — I509 Heart failure, unspecified: Secondary | ICD-10-CM | POA: Diagnosis not present

## 2017-09-14 DIAGNOSIS — E119 Type 2 diabetes mellitus without complications: Secondary | ICD-10-CM | POA: Diagnosis not present

## 2017-09-14 DIAGNOSIS — Z7982 Long term (current) use of aspirin: Secondary | ICD-10-CM | POA: Diagnosis not present

## 2017-09-15 DIAGNOSIS — I509 Heart failure, unspecified: Secondary | ICD-10-CM | POA: Diagnosis not present

## 2017-09-15 DIAGNOSIS — N400 Enlarged prostate without lower urinary tract symptoms: Secondary | ICD-10-CM | POA: Diagnosis not present

## 2017-09-15 DIAGNOSIS — Z7982 Long term (current) use of aspirin: Secondary | ICD-10-CM | POA: Diagnosis not present

## 2017-09-15 DIAGNOSIS — Z794 Long term (current) use of insulin: Secondary | ICD-10-CM | POA: Diagnosis not present

## 2017-09-15 DIAGNOSIS — E119 Type 2 diabetes mellitus without complications: Secondary | ICD-10-CM | POA: Diagnosis not present

## 2017-09-15 DIAGNOSIS — M069 Rheumatoid arthritis, unspecified: Secondary | ICD-10-CM | POA: Diagnosis not present

## 2017-09-15 DIAGNOSIS — Z960 Presence of urogenital implants: Secondary | ICD-10-CM | POA: Diagnosis not present

## 2017-09-15 DIAGNOSIS — Z89512 Acquired absence of left leg below knee: Secondary | ICD-10-CM | POA: Diagnosis not present

## 2017-09-15 DIAGNOSIS — I110 Hypertensive heart disease with heart failure: Secondary | ICD-10-CM | POA: Diagnosis not present

## 2017-09-18 ENCOUNTER — Other Ambulatory Visit: Payer: Self-pay | Admitting: Family Medicine

## 2017-09-18 ENCOUNTER — Telehealth: Payer: Self-pay | Admitting: Family Medicine

## 2017-09-18 DIAGNOSIS — Z960 Presence of urogenital implants: Secondary | ICD-10-CM | POA: Diagnosis not present

## 2017-09-18 DIAGNOSIS — M069 Rheumatoid arthritis, unspecified: Secondary | ICD-10-CM | POA: Diagnosis not present

## 2017-09-18 DIAGNOSIS — Z7982 Long term (current) use of aspirin: Secondary | ICD-10-CM | POA: Diagnosis not present

## 2017-09-18 DIAGNOSIS — Z89512 Acquired absence of left leg below knee: Secondary | ICD-10-CM | POA: Diagnosis not present

## 2017-09-18 DIAGNOSIS — N400 Enlarged prostate without lower urinary tract symptoms: Secondary | ICD-10-CM | POA: Diagnosis not present

## 2017-09-18 DIAGNOSIS — I509 Heart failure, unspecified: Secondary | ICD-10-CM | POA: Diagnosis not present

## 2017-09-18 DIAGNOSIS — I110 Hypertensive heart disease with heart failure: Secondary | ICD-10-CM | POA: Diagnosis not present

## 2017-09-18 DIAGNOSIS — E119 Type 2 diabetes mellitus without complications: Secondary | ICD-10-CM | POA: Diagnosis not present

## 2017-09-18 DIAGNOSIS — Z794 Long term (current) use of insulin: Secondary | ICD-10-CM | POA: Diagnosis not present

## 2017-09-18 NOTE — Telephone Encounter (Signed)
Dr. Steve to see 

## 2017-09-18 NOTE — Telephone Encounter (Signed)
Ok lets do 

## 2017-09-18 NOTE — Telephone Encounter (Signed)
Requesting order for 3 in 1 bedside commode.  F [phone]

## 2017-09-19 ENCOUNTER — Ambulatory Visit (INDEPENDENT_AMBULATORY_CARE_PROVIDER_SITE_OTHER): Payer: Medicare HMO | Admitting: Urology

## 2017-09-19 DIAGNOSIS — R338 Other retention of urine: Secondary | ICD-10-CM

## 2017-09-19 NOTE — Telephone Encounter (Signed)
Order faxed to number provided

## 2017-09-21 DIAGNOSIS — Z89512 Acquired absence of left leg below knee: Secondary | ICD-10-CM | POA: Diagnosis not present

## 2017-09-21 DIAGNOSIS — I509 Heart failure, unspecified: Secondary | ICD-10-CM | POA: Diagnosis not present

## 2017-09-21 DIAGNOSIS — E119 Type 2 diabetes mellitus without complications: Secondary | ICD-10-CM | POA: Diagnosis not present

## 2017-09-21 DIAGNOSIS — I110 Hypertensive heart disease with heart failure: Secondary | ICD-10-CM | POA: Diagnosis not present

## 2017-09-21 DIAGNOSIS — Z794 Long term (current) use of insulin: Secondary | ICD-10-CM | POA: Diagnosis not present

## 2017-09-21 DIAGNOSIS — N400 Enlarged prostate without lower urinary tract symptoms: Secondary | ICD-10-CM | POA: Diagnosis not present

## 2017-09-21 DIAGNOSIS — Z7982 Long term (current) use of aspirin: Secondary | ICD-10-CM | POA: Diagnosis not present

## 2017-09-21 DIAGNOSIS — M069 Rheumatoid arthritis, unspecified: Secondary | ICD-10-CM | POA: Diagnosis not present

## 2017-09-21 DIAGNOSIS — Z960 Presence of urogenital implants: Secondary | ICD-10-CM | POA: Diagnosis not present

## 2017-09-22 ENCOUNTER — Telehealth: Payer: Self-pay | Admitting: Family Medicine

## 2017-09-22 DIAGNOSIS — L03116 Cellulitis of left lower limb: Secondary | ICD-10-CM | POA: Diagnosis not present

## 2017-09-22 DIAGNOSIS — M069 Rheumatoid arthritis, unspecified: Secondary | ICD-10-CM | POA: Diagnosis not present

## 2017-09-22 DIAGNOSIS — A419 Sepsis, unspecified organism: Secondary | ICD-10-CM | POA: Diagnosis not present

## 2017-09-22 DIAGNOSIS — L8990 Pressure ulcer of unspecified site, unspecified stage: Secondary | ICD-10-CM | POA: Diagnosis not present

## 2017-09-22 DIAGNOSIS — J189 Pneumonia, unspecified organism: Secondary | ICD-10-CM | POA: Diagnosis not present

## 2017-09-22 DIAGNOSIS — L02416 Cutaneous abscess of left lower limb: Secondary | ICD-10-CM | POA: Diagnosis not present

## 2017-09-22 DIAGNOSIS — R7881 Bacteremia: Secondary | ICD-10-CM | POA: Diagnosis not present

## 2017-09-22 NOTE — Telephone Encounter (Signed)
Requesting recerification for therapy 2 times weekly for 4 weeks.

## 2017-09-24 NOTE — Telephone Encounter (Signed)
sure

## 2017-09-25 NOTE — Telephone Encounter (Signed)
Stacy notified. 

## 2017-09-26 DIAGNOSIS — I509 Heart failure, unspecified: Secondary | ICD-10-CM | POA: Diagnosis not present

## 2017-09-26 DIAGNOSIS — M069 Rheumatoid arthritis, unspecified: Secondary | ICD-10-CM | POA: Diagnosis not present

## 2017-09-26 DIAGNOSIS — I110 Hypertensive heart disease with heart failure: Secondary | ICD-10-CM | POA: Diagnosis not present

## 2017-09-26 DIAGNOSIS — E119 Type 2 diabetes mellitus without complications: Secondary | ICD-10-CM | POA: Diagnosis not present

## 2017-09-27 DIAGNOSIS — I110 Hypertensive heart disease with heart failure: Secondary | ICD-10-CM | POA: Diagnosis not present

## 2017-09-27 DIAGNOSIS — Z7982 Long term (current) use of aspirin: Secondary | ICD-10-CM | POA: Diagnosis not present

## 2017-09-27 DIAGNOSIS — I509 Heart failure, unspecified: Secondary | ICD-10-CM | POA: Diagnosis not present

## 2017-09-27 DIAGNOSIS — N400 Enlarged prostate without lower urinary tract symptoms: Secondary | ICD-10-CM | POA: Diagnosis not present

## 2017-09-27 DIAGNOSIS — M069 Rheumatoid arthritis, unspecified: Secondary | ICD-10-CM | POA: Diagnosis not present

## 2017-09-27 DIAGNOSIS — Z794 Long term (current) use of insulin: Secondary | ICD-10-CM | POA: Diagnosis not present

## 2017-09-27 DIAGNOSIS — E119 Type 2 diabetes mellitus without complications: Secondary | ICD-10-CM | POA: Diagnosis not present

## 2017-09-27 DIAGNOSIS — Z960 Presence of urogenital implants: Secondary | ICD-10-CM | POA: Diagnosis not present

## 2017-09-27 DIAGNOSIS — Z89512 Acquired absence of left leg below knee: Secondary | ICD-10-CM | POA: Diagnosis not present

## 2017-10-03 DIAGNOSIS — Z89512 Acquired absence of left leg below knee: Secondary | ICD-10-CM | POA: Diagnosis not present

## 2017-10-03 DIAGNOSIS — M069 Rheumatoid arthritis, unspecified: Secondary | ICD-10-CM | POA: Diagnosis not present

## 2017-10-03 DIAGNOSIS — Z7982 Long term (current) use of aspirin: Secondary | ICD-10-CM | POA: Diagnosis not present

## 2017-10-03 DIAGNOSIS — Z794 Long term (current) use of insulin: Secondary | ICD-10-CM | POA: Diagnosis not present

## 2017-10-03 DIAGNOSIS — E119 Type 2 diabetes mellitus without complications: Secondary | ICD-10-CM | POA: Diagnosis not present

## 2017-10-03 DIAGNOSIS — I110 Hypertensive heart disease with heart failure: Secondary | ICD-10-CM | POA: Diagnosis not present

## 2017-10-03 DIAGNOSIS — Z960 Presence of urogenital implants: Secondary | ICD-10-CM | POA: Diagnosis not present

## 2017-10-03 DIAGNOSIS — I509 Heart failure, unspecified: Secondary | ICD-10-CM | POA: Diagnosis not present

## 2017-10-03 DIAGNOSIS — N400 Enlarged prostate without lower urinary tract symptoms: Secondary | ICD-10-CM | POA: Diagnosis not present

## 2017-10-06 DIAGNOSIS — E119 Type 2 diabetes mellitus without complications: Secondary | ICD-10-CM | POA: Diagnosis not present

## 2017-10-06 DIAGNOSIS — Z794 Long term (current) use of insulin: Secondary | ICD-10-CM | POA: Diagnosis not present

## 2017-10-06 DIAGNOSIS — I110 Hypertensive heart disease with heart failure: Secondary | ICD-10-CM | POA: Diagnosis not present

## 2017-10-06 DIAGNOSIS — N400 Enlarged prostate without lower urinary tract symptoms: Secondary | ICD-10-CM | POA: Diagnosis not present

## 2017-10-06 DIAGNOSIS — M069 Rheumatoid arthritis, unspecified: Secondary | ICD-10-CM | POA: Diagnosis not present

## 2017-10-06 DIAGNOSIS — Z960 Presence of urogenital implants: Secondary | ICD-10-CM | POA: Diagnosis not present

## 2017-10-06 DIAGNOSIS — Z7982 Long term (current) use of aspirin: Secondary | ICD-10-CM | POA: Diagnosis not present

## 2017-10-06 DIAGNOSIS — I509 Heart failure, unspecified: Secondary | ICD-10-CM | POA: Diagnosis not present

## 2017-10-06 DIAGNOSIS — Z89512 Acquired absence of left leg below knee: Secondary | ICD-10-CM | POA: Diagnosis not present

## 2017-10-09 DIAGNOSIS — R7881 Bacteremia: Secondary | ICD-10-CM | POA: Diagnosis not present

## 2017-10-09 DIAGNOSIS — L02416 Cutaneous abscess of left lower limb: Secondary | ICD-10-CM | POA: Diagnosis not present

## 2017-10-09 DIAGNOSIS — J189 Pneumonia, unspecified organism: Secondary | ICD-10-CM | POA: Diagnosis not present

## 2017-10-09 DIAGNOSIS — L03116 Cellulitis of left lower limb: Secondary | ICD-10-CM | POA: Diagnosis not present

## 2017-10-09 DIAGNOSIS — I5043 Acute on chronic combined systolic (congestive) and diastolic (congestive) heart failure: Secondary | ICD-10-CM | POA: Diagnosis not present

## 2017-10-09 DIAGNOSIS — R260 Ataxic gait: Secondary | ICD-10-CM | POA: Diagnosis not present

## 2017-10-09 DIAGNOSIS — A419 Sepsis, unspecified organism: Secondary | ICD-10-CM | POA: Diagnosis not present

## 2017-10-09 DIAGNOSIS — L8990 Pressure ulcer of unspecified site, unspecified stage: Secondary | ICD-10-CM | POA: Diagnosis not present

## 2017-10-09 DIAGNOSIS — M069 Rheumatoid arthritis, unspecified: Secondary | ICD-10-CM | POA: Diagnosis not present

## 2017-10-12 ENCOUNTER — Ambulatory Visit: Payer: Medicare HMO | Admitting: Family Medicine

## 2017-10-12 DIAGNOSIS — Z89512 Acquired absence of left leg below knee: Secondary | ICD-10-CM | POA: Diagnosis not present

## 2017-10-12 DIAGNOSIS — Z7982 Long term (current) use of aspirin: Secondary | ICD-10-CM | POA: Diagnosis not present

## 2017-10-12 DIAGNOSIS — M069 Rheumatoid arthritis, unspecified: Secondary | ICD-10-CM | POA: Diagnosis not present

## 2017-10-12 DIAGNOSIS — N400 Enlarged prostate without lower urinary tract symptoms: Secondary | ICD-10-CM | POA: Diagnosis not present

## 2017-10-12 DIAGNOSIS — Z794 Long term (current) use of insulin: Secondary | ICD-10-CM | POA: Diagnosis not present

## 2017-10-12 DIAGNOSIS — I110 Hypertensive heart disease with heart failure: Secondary | ICD-10-CM | POA: Diagnosis not present

## 2017-10-12 DIAGNOSIS — Z960 Presence of urogenital implants: Secondary | ICD-10-CM | POA: Diagnosis not present

## 2017-10-12 DIAGNOSIS — E119 Type 2 diabetes mellitus without complications: Secondary | ICD-10-CM | POA: Diagnosis not present

## 2017-10-12 DIAGNOSIS — I509 Heart failure, unspecified: Secondary | ICD-10-CM | POA: Diagnosis not present

## 2017-10-13 DIAGNOSIS — I509 Heart failure, unspecified: Secondary | ICD-10-CM | POA: Diagnosis not present

## 2017-10-13 DIAGNOSIS — M069 Rheumatoid arthritis, unspecified: Secondary | ICD-10-CM | POA: Diagnosis not present

## 2017-10-13 DIAGNOSIS — Z794 Long term (current) use of insulin: Secondary | ICD-10-CM | POA: Diagnosis not present

## 2017-10-13 DIAGNOSIS — N400 Enlarged prostate without lower urinary tract symptoms: Secondary | ICD-10-CM | POA: Diagnosis not present

## 2017-10-13 DIAGNOSIS — E119 Type 2 diabetes mellitus without complications: Secondary | ICD-10-CM | POA: Diagnosis not present

## 2017-10-13 DIAGNOSIS — Z89512 Acquired absence of left leg below knee: Secondary | ICD-10-CM | POA: Diagnosis not present

## 2017-10-13 DIAGNOSIS — I110 Hypertensive heart disease with heart failure: Secondary | ICD-10-CM | POA: Diagnosis not present

## 2017-10-13 DIAGNOSIS — Z960 Presence of urogenital implants: Secondary | ICD-10-CM | POA: Diagnosis not present

## 2017-10-13 DIAGNOSIS — Z7982 Long term (current) use of aspirin: Secondary | ICD-10-CM | POA: Diagnosis not present

## 2017-10-16 DIAGNOSIS — N400 Enlarged prostate without lower urinary tract symptoms: Secondary | ICD-10-CM | POA: Diagnosis not present

## 2017-10-16 DIAGNOSIS — Z960 Presence of urogenital implants: Secondary | ICD-10-CM | POA: Diagnosis not present

## 2017-10-16 DIAGNOSIS — Z7982 Long term (current) use of aspirin: Secondary | ICD-10-CM | POA: Diagnosis not present

## 2017-10-16 DIAGNOSIS — M069 Rheumatoid arthritis, unspecified: Secondary | ICD-10-CM | POA: Diagnosis not present

## 2017-10-16 DIAGNOSIS — E119 Type 2 diabetes mellitus without complications: Secondary | ICD-10-CM | POA: Diagnosis not present

## 2017-10-16 DIAGNOSIS — I509 Heart failure, unspecified: Secondary | ICD-10-CM | POA: Diagnosis not present

## 2017-10-16 DIAGNOSIS — Z89512 Acquired absence of left leg below knee: Secondary | ICD-10-CM | POA: Diagnosis not present

## 2017-10-16 DIAGNOSIS — I110 Hypertensive heart disease with heart failure: Secondary | ICD-10-CM | POA: Diagnosis not present

## 2017-10-16 DIAGNOSIS — Z794 Long term (current) use of insulin: Secondary | ICD-10-CM | POA: Diagnosis not present

## 2017-10-19 DIAGNOSIS — E119 Type 2 diabetes mellitus without complications: Secondary | ICD-10-CM | POA: Diagnosis not present

## 2017-10-19 DIAGNOSIS — N400 Enlarged prostate without lower urinary tract symptoms: Secondary | ICD-10-CM | POA: Diagnosis not present

## 2017-10-19 DIAGNOSIS — Z960 Presence of urogenital implants: Secondary | ICD-10-CM | POA: Diagnosis not present

## 2017-10-19 DIAGNOSIS — Z89512 Acquired absence of left leg below knee: Secondary | ICD-10-CM | POA: Diagnosis not present

## 2017-10-19 DIAGNOSIS — I509 Heart failure, unspecified: Secondary | ICD-10-CM | POA: Diagnosis not present

## 2017-10-19 DIAGNOSIS — Z7982 Long term (current) use of aspirin: Secondary | ICD-10-CM | POA: Diagnosis not present

## 2017-10-19 DIAGNOSIS — Z794 Long term (current) use of insulin: Secondary | ICD-10-CM | POA: Diagnosis not present

## 2017-10-19 DIAGNOSIS — I110 Hypertensive heart disease with heart failure: Secondary | ICD-10-CM | POA: Diagnosis not present

## 2017-10-19 DIAGNOSIS — M069 Rheumatoid arthritis, unspecified: Secondary | ICD-10-CM | POA: Diagnosis not present

## 2017-10-23 ENCOUNTER — Other Ambulatory Visit: Payer: Self-pay | Admitting: Family Medicine

## 2017-10-26 DIAGNOSIS — R338 Other retention of urine: Secondary | ICD-10-CM | POA: Diagnosis not present

## 2017-10-26 DIAGNOSIS — N401 Enlarged prostate with lower urinary tract symptoms: Secondary | ICD-10-CM | POA: Diagnosis not present

## 2017-10-30 ENCOUNTER — Ambulatory Visit: Payer: Medicare HMO | Admitting: Family Medicine

## 2017-11-07 ENCOUNTER — Ambulatory Visit: Payer: Medicare HMO | Admitting: Family Medicine

## 2017-11-20 ENCOUNTER — Encounter: Payer: Self-pay | Admitting: Family Medicine

## 2017-11-20 ENCOUNTER — Ambulatory Visit: Payer: Medicare HMO | Admitting: Family Medicine

## 2017-11-20 VITALS — Ht 67.0 in | Wt 175.0 lb

## 2017-11-20 DIAGNOSIS — M05711 Rheumatoid arthritis with rheumatoid factor of right shoulder without organ or systems involvement: Secondary | ICD-10-CM

## 2017-11-20 DIAGNOSIS — E119 Type 2 diabetes mellitus without complications: Secondary | ICD-10-CM

## 2017-11-20 DIAGNOSIS — Z23 Encounter for immunization: Secondary | ICD-10-CM | POA: Diagnosis not present

## 2017-11-20 DIAGNOSIS — L989 Disorder of the skin and subcutaneous tissue, unspecified: Secondary | ICD-10-CM

## 2017-11-20 DIAGNOSIS — I10 Essential (primary) hypertension: Secondary | ICD-10-CM

## 2017-11-20 DIAGNOSIS — M05712 Rheumatoid arthritis with rheumatoid factor of left shoulder without organ or systems involvement: Secondary | ICD-10-CM

## 2017-11-20 LAB — POCT GLYCOSYLATED HEMOGLOBIN (HGB A1C): HEMOGLOBIN A1C: 5.5

## 2017-11-20 MED ORDER — INSULIN ASPART PROT & ASPART (70-30 MIX) 100 UNIT/ML ~~LOC~~ SUSP: SUBCUTANEOUS | 2 refills | Status: DC

## 2017-11-20 NOTE — Progress Notes (Signed)
   Subjective:    Patient ID: [name], male    DOB: 20-Feb-1951, 66 y.o.   MRN: [id number]  HPI  Patient is here today to follow up on DM. He is currently on Novolog 6 units am and 6 units pm.He eats pretty healthy,does not get much exercise. Would like to be sent to a dermatologist for skin irritations in [place] was referred here in [place] he did not care for the Dr he seen.   Would like flu and pneumonia shots. Results for orders placed or performed in visit on 11/20/17  POCT glycosylated hemoglobin (Hb A1C)  Result Value Ref Range   Hemoglobin A1C 5.5     Results for orders placed or performed in visit on 11/20/17  POCT glycosylated hemoglobin (Hb A1C)  Result Value Ref Range   Hemoglobin A1C 5.5    Patient claims compliance with diabetes medication. No obvious side effects. Reports no substantial low sugar spells. Most numbers are generally in good range when checked fasting. Generally does not miss a dose of medication. Watching diabetic diet closely  Patient continues to take lipid medication regularly. No obvious side effects from it. Generally does not miss a dose. Prior blood work results are reviewed with patient. Patient continues to work on fat intake in diet  Blood pressure medicine and blood pressure levels reviewed today with patient. Compliant with blood pressure medicine. States does not miss a dose. No obvious side effects. Blood pressure generally good when checked elsewhere. Watching salt intake.   Patient continues to have difficulties getting around.  He is receiving amputation.  See prior notes.  He has severe rheumatoid arthritis which we have him unable to use his hands.  He has chronic low back pain which he takes from narcotics.  All these reasons he needs additional ambulation with an electric wheelchair.  Discussed.  Ongoing challenges with multiple spots on his arms.  Concerned about this.  Saw Dr. [place] Crane and did not like him.  Would like to see Dr.  Tarri Glenn hands  Review of Systems No headache, no major weight loss or weight gain, no chest pain no back pain abdominal pain no change in bowel habits complete ROS otherwise negative     Objective:   Physical Exam  Alert and oriented, vitals reviewed and stable, NAD ENT-TM's and ext canals WNL bilat via otoscopic exam Soft palate, tonsils and post pharynx WNL via oropharyngeal exam Neck-symmetric, no masses; thyroid nonpalpable and nontender Pulmonary-no tachypnea or accessory muscle use; Clear without wheezes via auscultation Card--no abnrml murmurs, rhythm reg and rate WNL Carotid pulses symmetric, without bruits Very advanced deformity secondary to rheumatoid arthritis.  Left below the knee amputation.  Stump healing well.  Low back pain to percussion impression 1 type 2 diabetes good control discussed in fact will decrease insulin      Assessment & Plan:    2.  Ongoing chronic rash and skin lesions would like to see Dr. Tarri Glenn will arrange  3.  Ongoing challenges with ambulation.  Discussed.  Will work towards an Clinical research associate.  4.  Hypertension good control discussed to maintain same

## 2017-11-23 ENCOUNTER — Other Ambulatory Visit: Payer: Self-pay | Admitting: Family Medicine

## 2017-11-23 NOTE — Telephone Encounter (Signed)
Last seen 11/20/17

## 2017-11-23 NOTE — Telephone Encounter (Signed)
Yes six mo worth

## 2017-11-24 ENCOUNTER — Ambulatory Visit: Payer: Medicare HMO | Admitting: Urology

## 2017-11-28 ENCOUNTER — Encounter: Payer: Self-pay | Admitting: Family Medicine

## 2017-12-01 ENCOUNTER — Ambulatory Visit (INDEPENDENT_AMBULATORY_CARE_PROVIDER_SITE_OTHER): Payer: Medicare HMO | Admitting: Urology

## 2017-12-01 DIAGNOSIS — R338 Other retention of urine: Secondary | ICD-10-CM

## 2017-12-01 DIAGNOSIS — N401 Enlarged prostate with lower urinary tract symptoms: Secondary | ICD-10-CM

## 2017-12-11 DIAGNOSIS — D509 Iron deficiency anemia, unspecified: Secondary | ICD-10-CM | POA: Diagnosis not present

## 2017-12-11 DIAGNOSIS — N183 Chronic kidney disease, stage 3 (moderate): Secondary | ICD-10-CM | POA: Diagnosis not present

## 2017-12-11 DIAGNOSIS — R809 Proteinuria, unspecified: Secondary | ICD-10-CM | POA: Diagnosis not present

## 2017-12-11 DIAGNOSIS — E559 Vitamin D deficiency, unspecified: Secondary | ICD-10-CM | POA: Diagnosis not present

## 2017-12-11 DIAGNOSIS — Z79899 Other long term (current) drug therapy: Secondary | ICD-10-CM | POA: Diagnosis not present

## 2017-12-11 DIAGNOSIS — I10 Essential (primary) hypertension: Secondary | ICD-10-CM | POA: Diagnosis not present

## 2017-12-13 DIAGNOSIS — R809 Proteinuria, unspecified: Secondary | ICD-10-CM | POA: Diagnosis not present

## 2017-12-13 DIAGNOSIS — D638 Anemia in other chronic diseases classified elsewhere: Secondary | ICD-10-CM | POA: Diagnosis not present

## 2017-12-13 DIAGNOSIS — E559 Vitamin D deficiency, unspecified: Secondary | ICD-10-CM | POA: Diagnosis not present

## 2017-12-13 DIAGNOSIS — N183 Chronic kidney disease, stage 3 (moderate): Secondary | ICD-10-CM | POA: Diagnosis not present

## 2017-12-20 ENCOUNTER — Encounter: Payer: Self-pay | Admitting: Family Medicine

## 2018-01-02 ENCOUNTER — Emergency Department (HOSPITAL_COMMUNITY)
Admission: EM | Admit: 2018-01-02 | Discharge: 2018-01-02 | Disposition: A | Discharge status: Home / Self Care | Payer: Medicare HMO | Attending: Emergency Medicine | Admitting: Emergency Medicine

## 2018-01-02 ENCOUNTER — Encounter (HOSPITAL_COMMUNITY): Payer: Self-pay | Admitting: *Deleted

## 2018-01-02 ENCOUNTER — Emergency Department (HOSPITAL_COMMUNITY): Payer: Medicare HMO

## 2018-01-02 DIAGNOSIS — E1122 Type 2 diabetes mellitus with diabetic chronic kidney disease: Secondary | ICD-10-CM | POA: Insufficient documentation

## 2018-01-02 DIAGNOSIS — Z794 Long term (current) use of insulin: Secondary | ICD-10-CM | POA: Diagnosis not present

## 2018-01-02 DIAGNOSIS — M25511 Pain in right shoulder: Secondary | ICD-10-CM | POA: Insufficient documentation

## 2018-01-02 DIAGNOSIS — I2510 Atherosclerotic heart disease of native coronary artery without angina pectoris: Secondary | ICD-10-CM | POA: Diagnosis not present

## 2018-01-02 DIAGNOSIS — Z7982 Long term (current) use of aspirin: Secondary | ICD-10-CM | POA: Diagnosis not present

## 2018-01-02 DIAGNOSIS — I120 Hypertensive chronic kidney disease with stage 5 chronic kidney disease or end stage renal disease: Secondary | ICD-10-CM | POA: Diagnosis not present

## 2018-01-02 DIAGNOSIS — M25562 Pain in left knee: Secondary | ICD-10-CM | POA: Diagnosis not present

## 2018-01-02 DIAGNOSIS — M542 Cervicalgia: Secondary | ICD-10-CM | POA: Diagnosis not present

## 2018-01-02 DIAGNOSIS — M7918 Myalgia, other site: Secondary | ICD-10-CM

## 2018-01-02 DIAGNOSIS — N186 End stage renal disease: Secondary | ICD-10-CM | POA: Insufficient documentation

## 2018-01-02 DIAGNOSIS — M25521 Pain in right elbow: Secondary | ICD-10-CM | POA: Diagnosis not present

## 2018-01-02 DIAGNOSIS — I5042 Chronic combined systolic (congestive) and diastolic (congestive) heart failure: Secondary | ICD-10-CM | POA: Diagnosis not present

## 2018-01-02 DIAGNOSIS — S59901A Unspecified injury of right elbow, initial encounter: Secondary | ICD-10-CM | POA: Diagnosis not present

## 2018-01-02 DIAGNOSIS — M0579 Rheumatoid arthritis with rheumatoid factor of multiple sites without organ or systems involvement: Secondary | ICD-10-CM | POA: Diagnosis not present

## 2018-01-02 DIAGNOSIS — I110 Hypertensive heart disease with heart failure: Secondary | ICD-10-CM | POA: Insufficient documentation

## 2018-01-02 DIAGNOSIS — Z89512 Acquired absence of left leg below knee: Secondary | ICD-10-CM | POA: Diagnosis not present

## 2018-01-02 DIAGNOSIS — Z041 Encounter for examination and observation following transport accident: Secondary | ICD-10-CM | POA: Insufficient documentation

## 2018-01-02 DIAGNOSIS — S8992XA Unspecified injury of left lower leg, initial encounter: Secondary | ICD-10-CM | POA: Diagnosis not present

## 2018-01-02 MED ORDER — NAPROXEN 500 MG PO TABS: 500.0000 mg | ORAL_TABLET | Freq: Two times a day (BID) | ORAL | 0 refills | Status: DC
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
## 2018-01-02 NOTE — ED Notes (Signed)
Pt alert & oriented x4. Patient given discharge instructions, paperwork & prescription(s). Patient verbalized understanding. Pt left department w/ no further questions.  

## 2018-01-02 NOTE — Discharge Instructions (Addendum)
Use the medicine prescribed for inflammation and continue to take your other home pain medications.  A heating pad applied to the areas that are sore 20 minutes several times daily will be helpful.  Get rechecked if not improving over the next 7-10 days.  You may at the naproxen as prescribed, however make sure you take this with food and if you are developing stomach upset or acid reflux discontinue this medication.  You may need further evaluation if your symptoms persist, give your symptoms another week to improve.  Use ice and heat as discussed, elevation of your left leg can help with your knee swelling.

## 2018-01-05 ENCOUNTER — Ambulatory Visit (INDEPENDENT_AMBULATORY_CARE_PROVIDER_SITE_OTHER): Payer: Medicare HMO | Admitting: Urology

## 2018-01-05 DIAGNOSIS — R338 Other retention of urine: Secondary | ICD-10-CM | POA: Diagnosis not present

## 2018-01-16 ENCOUNTER — Other Ambulatory Visit: Payer: Self-pay | Admitting: Family Medicine

## 2018-01-26 ENCOUNTER — Other Ambulatory Visit: Payer: Self-pay | Admitting: Family Medicine

## 2018-02-09 ENCOUNTER — Ambulatory Visit: Payer: Medicare HMO | Admitting: Urology

## 2018-02-20 ENCOUNTER — Ambulatory Visit: Payer: Medicare HMO | Admitting: Family Medicine

## 2018-02-22 ENCOUNTER — Other Ambulatory Visit: Payer: Self-pay | Admitting: Family Medicine

## 2018-03-01 ENCOUNTER — Encounter: Payer: Self-pay | Admitting: Family Medicine

## 2018-03-01 ENCOUNTER — Ambulatory Visit (INDEPENDENT_AMBULATORY_CARE_PROVIDER_SITE_OTHER): Payer: Medicare HMO | Admitting: Family Medicine

## 2018-03-01 VITALS — BP 132/84 | Ht 68.0 in

## 2018-03-01 DIAGNOSIS — M25519 Pain in unspecified shoulder: Secondary | ICD-10-CM | POA: Diagnosis not present

## 2018-03-01 DIAGNOSIS — E782 Mixed hyperlipidemia: Secondary | ICD-10-CM | POA: Diagnosis not present

## 2018-03-01 DIAGNOSIS — Z79899 Other long term (current) drug therapy: Secondary | ICD-10-CM | POA: Diagnosis not present

## 2018-03-01 DIAGNOSIS — Z794 Long term (current) use of insulin: Secondary | ICD-10-CM

## 2018-03-01 DIAGNOSIS — R5383 Other fatigue: Secondary | ICD-10-CM

## 2018-03-01 DIAGNOSIS — IMO0001 Reserved for inherently not codable concepts without codable children: Secondary | ICD-10-CM

## 2018-03-01 DIAGNOSIS — Z125 Encounter for screening for malignant neoplasm of prostate: Secondary | ICD-10-CM | POA: Diagnosis not present

## 2018-03-01 DIAGNOSIS — E119 Type 2 diabetes mellitus without complications: Secondary | ICD-10-CM | POA: Diagnosis not present

## 2018-03-01 LAB — POCT GLYCOSYLATED HEMOGLOBIN (HGB A1C): Hemoglobin A1C: 6

## 2018-03-01 MED ORDER — PREDNISONE 5 MG PO TABS: 5.0000 mg | ORAL_TABLET | Freq: Every day | ORAL | 5 refills | Status: DC

## 2018-03-01 MED ORDER — TAMSULOSIN HCL 0.4 MG PO CAPS: 0.4000 mg | ORAL_CAPSULE | Freq: Every day | ORAL | 5 refills | Status: DC

## 2018-03-01 MED ORDER — AMITRIPTYLINE HCL 50 MG PO TABS: ORAL_TABLET | ORAL | 5 refills | Status: DC

## 2018-03-01 MED ORDER — NAPROXEN 500 MG PO TABS: 500.0000 mg | ORAL_TABLET | Freq: Two times a day (BID) | ORAL | 0 refills | Status: DC

## 2018-03-01 MED ORDER — METOPROLOL SUCCINATE ER 25 MG PO TB24: ORAL_TABLET | ORAL | 5 refills | Status: DC

## 2018-03-01 MED ORDER — TORSEMIDE 10 MG PO TABS: ORAL_TABLET | ORAL | 5 refills | Status: DC

## 2018-03-01 MED ORDER — ATORVASTATIN CALCIUM 80 MG PO TABS: 80.0000 mg | ORAL_TABLET | Freq: Every day | ORAL | 5 refills | Status: DC

## 2018-03-01 MED ORDER — POLYETHYLENE GLYCOL 3350 17 GM/SCOOP PO POWD: 17.0000 g | Freq: Two times a day (BID) | ORAL | Status: DC

## 2018-03-01 MED ORDER — PREGABALIN 25 MG PO CAPS: 25.0000 mg | ORAL_CAPSULE | Freq: Every day | ORAL | 5 refills | Status: DC

## 2018-03-01 MED ORDER — INSULIN ASPART PROT & ASPART (70-30 MIX) 100 UNIT/ML ~~LOC~~ SUSP: SUBCUTANEOUS | 2 refills | Status: DC

## 2018-03-01 NOTE — Progress Notes (Signed)
   Subjective:    Patient ID: [name], male    DOB: [birth year], 67 y.o.   MRN: [id number]  Diabetes  He presents for his follow-up diabetic visit. He has type 2 diabetes mellitus. There are no diabetic associated symptoms. He has not had a previous visit with a dietitian. He does not see a podiatrist.Eye exam is not current.   Results for orders placed or performed in visit on 03/01/18  POCT HgB A1C  Result Value Ref Range   Hemoglobin A1C 6.0     Blood pressure medicine and blood pressure levels reviewed today with patient. Compliant with blood pressure medicine. States does not miss a dose. No obvious side effects. Blood pressure generally good when checked elsewhere. Watching salt intake.   Patient claims compliance with diabetes medication. No obvious side effects. Reports no substantial low sugar spells. Most numbers are generally in good range when checked fasting. Generally does not miss a dose of medication. Watching diabetic diet closely   Patient continues to take lipid medication regularly. No obvious side effects from it. Generally does not miss a dose. Prior blood work results are reviewed with patient. Patient continues to work on fat intake in diet     Review of Systems No headache, no major weight loss or weight gain, no chest pain no back pain abdominal pain no change in bowel habits complete ROS otherwise negative     Objective:   Physical Exam Alert and oriented, vitals reviewed and stable, NAD ENT-TM's and ext canals WNL bilat via otoscopic exam Soft palate, tonsils and post pharynx WNL via oropharyngeal exam Neck-symmetric, no masses; thyroid nonpalpable and nontender Pulmonary-no tachypnea or accessory muscle use; Clear without wheezes via auscultation Card--no abnrml murmurs, rhythm reg and rate WNL Carotid pulses symmetric, without bruits Impression 1       Assessment & Plan:  111 impression 1 type 2 diabetes symptom care discussed diet exercise  discussed.  Excellent A1c.  Continue same treatment  2.  Hypertension good control discussed to maintain same  3.  Hyperlipidemia prior blood work reviewed discussed to maintain same  Number status post amputation overall doing reasonably well with it.  Ambulation discussed

## 2018-03-02 ENCOUNTER — Ambulatory Visit (INDEPENDENT_AMBULATORY_CARE_PROVIDER_SITE_OTHER): Payer: Medicare HMO | Admitting: Urology

## 2018-03-02 DIAGNOSIS — N401 Enlarged prostate with lower urinary tract symptoms: Secondary | ICD-10-CM | POA: Diagnosis not present

## 2018-03-05 ENCOUNTER — Encounter: Payer: Self-pay | Admitting: Family Medicine

## 2018-04-03 ENCOUNTER — Ambulatory Visit (INDEPENDENT_AMBULATORY_CARE_PROVIDER_SITE_OTHER): Payer: Medicare HMO | Admitting: Urology

## 2018-04-03 DIAGNOSIS — N401 Enlarged prostate with lower urinary tract symptoms: Secondary | ICD-10-CM | POA: Diagnosis not present

## 2018-04-03 DIAGNOSIS — Z125 Encounter for screening for malignant neoplasm of prostate: Secondary | ICD-10-CM | POA: Diagnosis not present

## 2018-04-03 DIAGNOSIS — E782 Mixed hyperlipidemia: Secondary | ICD-10-CM | POA: Diagnosis not present

## 2018-04-03 DIAGNOSIS — Z79899 Other long term (current) drug therapy: Secondary | ICD-10-CM | POA: Diagnosis not present

## 2018-04-03 DIAGNOSIS — R5383 Other fatigue: Secondary | ICD-10-CM | POA: Diagnosis not present

## 2018-04-04 LAB — HEPATIC FUNCTION PANEL
ALT: 35 IU/L (ref 0–44)
AST: 39 IU/L (ref 0–40)
Albumin: 3.7 g/dL (ref 3.6–4.8)
Alkaline Phosphatase: 169 IU/L — ABNORMAL HIGH (ref 39–117)
BILIRUBIN TOTAL: 0.4 mg/dL (ref 0.0–1.2)
BILIRUBIN, DIRECT: 0.18 mg/dL (ref 0.00–0.40)
Total Protein: 6.5 g/dL (ref 6.0–8.5)

## 2018-04-04 LAB — BASIC METABOLIC PANEL
BUN / CREAT RATIO: 19 (ref 10–24)
BUN: 26 mg/dL (ref 8–27)
CALCIUM: 9.6 mg/dL (ref 8.6–10.2)
CHLORIDE: 101 mmol/L (ref 96–106)
CO2: 23 mmol/L (ref 20–29)
Creatinine, Ser: 1.38 mg/dL — ABNORMAL HIGH (ref 0.76–1.27)
GFR calc non Af Amer: 52 mL/min/{1.73_m2} — ABNORMAL LOW (ref >59)
GFR, EST AFRICAN AMERICAN: 60 mL/min/{1.73_m2} (ref >59)
Glucose: 118 mg/dL — ABNORMAL HIGH (ref 65–99)
POTASSIUM: 4.1 mmol/L (ref 3.5–5.2)
Sodium: 141 mmol/L (ref 134–144)

## 2018-04-04 LAB — LIPID PANEL
CHOLESTEROL TOTAL: 125 mg/dL (ref 100–199)
Chol/HDL Ratio: 2.3 ratio (ref 0.0–5.0)
HDL: 55 mg/dL (ref >39)
LDL CALC: 48 mg/dL (ref 0–99)
TRIGLYCERIDES: 111 mg/dL (ref 0–149)
VLDL Cholesterol Cal: 22 mg/dL (ref 5–40)

## 2018-04-04 LAB — PROTEIN / CREATININE RATIO, URINE
Creatinine, Urine: 95.6 mg/dL
Protein, Ur: 55.7 mg/dL
Protein/Creat Ratio: 583 mg/g creat — ABNORMAL HIGH (ref 0–200)

## 2018-04-04 LAB — PSA: Prostate Specific Ag, Serum: 0.2 ng/mL (ref 0.0–4.0)

## 2018-04-05 ENCOUNTER — Encounter: Payer: Self-pay | Admitting: Family Medicine

## 2018-04-05 DIAGNOSIS — C44619 Basal cell carcinoma of skin of left upper limb, including shoulder: Secondary | ICD-10-CM | POA: Diagnosis not present

## 2018-04-05 DIAGNOSIS — L819 Disorder of pigmentation, unspecified: Secondary | ICD-10-CM | POA: Diagnosis not present

## 2018-04-05 DIAGNOSIS — D485 Neoplasm of uncertain behavior of skin: Secondary | ICD-10-CM | POA: Diagnosis not present

## 2018-04-05 DIAGNOSIS — R233 Spontaneous ecchymoses: Secondary | ICD-10-CM | POA: Diagnosis not present

## 2018-04-05 DIAGNOSIS — L570 Actinic keratosis: Secondary | ICD-10-CM | POA: Diagnosis not present

## 2018-04-17 DIAGNOSIS — D509 Iron deficiency anemia, unspecified: Secondary | ICD-10-CM | POA: Diagnosis not present

## 2018-04-17 DIAGNOSIS — Z79899 Other long term (current) drug therapy: Secondary | ICD-10-CM | POA: Diagnosis not present

## 2018-04-17 DIAGNOSIS — E559 Vitamin D deficiency, unspecified: Secondary | ICD-10-CM | POA: Diagnosis not present

## 2018-04-17 DIAGNOSIS — N183 Chronic kidney disease, stage 3 (moderate): Secondary | ICD-10-CM | POA: Diagnosis not present

## 2018-04-17 DIAGNOSIS — R809 Proteinuria, unspecified: Secondary | ICD-10-CM | POA: Diagnosis not present

## 2018-04-17 DIAGNOSIS — I10 Essential (primary) hypertension: Secondary | ICD-10-CM | POA: Diagnosis not present

## 2018-04-23 DIAGNOSIS — L0100 Impetigo, unspecified: Secondary | ICD-10-CM | POA: Diagnosis not present

## 2018-05-02 ENCOUNTER — Ambulatory Visit: Payer: Medicare HMO | Admitting: Urology

## 2018-05-07 ENCOUNTER — Ambulatory Visit: Payer: Medicare HMO | Admitting: Orthopedic Surgery

## 2018-05-07 ENCOUNTER — Encounter: Payer: Self-pay | Admitting: Orthopedic Surgery

## 2018-05-07 VITALS — BP 137/79 | HR 75 | Ht 68.0 in | Wt 175.0 lb

## 2018-05-07 DIAGNOSIS — G8929 Other chronic pain: Secondary | ICD-10-CM

## 2018-05-07 DIAGNOSIS — M12811 Other specific arthropathies, not elsewhere classified, right shoulder: Secondary | ICD-10-CM | POA: Diagnosis not present

## 2018-05-07 DIAGNOSIS — M25511 Pain in right shoulder: Secondary | ICD-10-CM
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
## 2018-05-09 ENCOUNTER — Ambulatory Visit (INDEPENDENT_AMBULATORY_CARE_PROVIDER_SITE_OTHER): Payer: Medicare HMO | Admitting: Urology

## 2018-05-09 DIAGNOSIS — R338 Other retention of urine: Secondary | ICD-10-CM | POA: Diagnosis not present

## 2018-05-09 DIAGNOSIS — N401 Enlarged prostate with lower urinary tract symptoms: Secondary | ICD-10-CM

## 2018-05-10 DIAGNOSIS — C44619 Basal cell carcinoma of skin of left upper limb, including shoulder: Secondary | ICD-10-CM | POA: Diagnosis not present

## 2018-05-24 ENCOUNTER — Other Ambulatory Visit: Payer: Self-pay | Admitting: Family Medicine

## 2018-05-31 ENCOUNTER — Ambulatory Visit: Payer: Medicare HMO | Admitting: Family Medicine

## 2018-06-06 ENCOUNTER — Ambulatory Visit: Payer: Medicare HMO | Admitting: Urology

## 2018-06-07 ENCOUNTER — Ambulatory Visit: Payer: Medicare HMO | Admitting: Family Medicine

## 2018-06-13 DIAGNOSIS — N183 Chronic kidney disease, stage 3 (moderate): Secondary | ICD-10-CM | POA: Diagnosis not present

## 2018-06-13 DIAGNOSIS — D638 Anemia in other chronic diseases classified elsewhere: Secondary | ICD-10-CM | POA: Diagnosis not present

## 2018-06-13 DIAGNOSIS — E559 Vitamin D deficiency, unspecified: Secondary | ICD-10-CM | POA: Diagnosis not present

## 2018-06-14 ENCOUNTER — Encounter: Payer: Self-pay | Admitting: Family Medicine

## 2018-06-14 ENCOUNTER — Ambulatory Visit (INDEPENDENT_AMBULATORY_CARE_PROVIDER_SITE_OTHER): Payer: Medicare HMO | Admitting: Family Medicine

## 2018-06-14 VITALS — BP 110/76 | Ht 68.0 in

## 2018-06-14 DIAGNOSIS — E782 Mixed hyperlipidemia: Secondary | ICD-10-CM

## 2018-06-14 DIAGNOSIS — I10 Essential (primary) hypertension: Secondary | ICD-10-CM | POA: Diagnosis not present

## 2018-06-14 DIAGNOSIS — E119 Type 2 diabetes mellitus without complications: Secondary | ICD-10-CM

## 2018-06-14 LAB — POCT GLYCOSYLATED HEMOGLOBIN (HGB A1C): HEMOGLOBIN A1C: 6.4 % — AB (ref 4.0–5.6)

## 2018-06-14 MED ORDER — METOPROLOL SUCCINATE ER 25 MG PO TB24: ORAL_TABLET | ORAL | 5 refills | Status: DC

## 2018-06-14 MED ORDER — TORSEMIDE 10 MG PO TABS: ORAL_TABLET | ORAL | 5 refills | Status: DC

## 2018-06-14 MED ORDER — INSULIN ASPART PROT & ASPART (70-30 MIX) 100 UNIT/ML ~~LOC~~ SUSP: SUBCUTANEOUS | 5 refills | Status: DC

## 2018-06-14 MED ORDER — PREGABALIN 25 MG PO CAPS: 25.0000 mg | ORAL_CAPSULE | Freq: Every day | ORAL | 5 refills | Status: DC

## 2018-06-14 MED ORDER — AMITRIPTYLINE HCL 50 MG PO TABS: ORAL_TABLET | ORAL | 5 refills | Status: DC

## 2018-06-14 MED ORDER — ATORVASTATIN CALCIUM 80 MG PO TABS: 80.0000 mg | ORAL_TABLET | Freq: Every day | ORAL | 5 refills | Status: DC
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
## 2018-06-15 ENCOUNTER — Ambulatory Visit (INDEPENDENT_AMBULATORY_CARE_PROVIDER_SITE_OTHER): Payer: Medicare HMO | Admitting: Urology

## 2018-06-15 DIAGNOSIS — R338 Other retention of urine: Secondary | ICD-10-CM | POA: Diagnosis not present

## 2018-06-27 ENCOUNTER — Other Ambulatory Visit: Payer: Self-pay | Admitting: Family Medicine

## 2018-07-03 DIAGNOSIS — G8929 Other chronic pain: Secondary | ICD-10-CM | POA: Diagnosis not present

## 2018-07-03 DIAGNOSIS — M069 Rheumatoid arthritis, unspecified: Secondary | ICD-10-CM | POA: Diagnosis not present

## 2018-07-11 ENCOUNTER — Ambulatory Visit (HOSPITAL_COMMUNITY)
Admission: RE | Admit: 2018-07-11 | Discharge: 2018-07-11 | Disposition: A | Discharge status: Home / Self Care | Payer: Medicare HMO | Source: Ambulatory Visit | Attending: Medical | Admitting: Medical

## 2018-07-11 ENCOUNTER — Other Ambulatory Visit (HOSPITAL_COMMUNITY): Payer: Self-pay | Admitting: Medical

## 2018-07-11 DIAGNOSIS — J90 Pleural effusion, not elsewhere classified: Secondary | ICD-10-CM | POA: Diagnosis not present

## 2018-07-11 DIAGNOSIS — R918 Other nonspecific abnormal finding of lung field: Secondary | ICD-10-CM | POA: Insufficient documentation

## 2018-07-11 DIAGNOSIS — R0602 Shortness of breath: Secondary | ICD-10-CM | POA: Diagnosis not present

## 2018-07-20 ENCOUNTER — Ambulatory Visit: Payer: Medicare HMO | Admitting: Urology

## 2018-07-25 ENCOUNTER — Ambulatory Visit: Payer: Medicare HMO | Admitting: Urology

## 2018-07-25 DIAGNOSIS — M069 Rheumatoid arthritis, unspecified: Secondary | ICD-10-CM | POA: Diagnosis not present

## 2018-07-25 DIAGNOSIS — G8929 Other chronic pain: Secondary | ICD-10-CM | POA: Diagnosis not present

## 2018-07-25 DIAGNOSIS — M2550 Pain in unspecified joint: Secondary | ICD-10-CM | POA: Diagnosis not present

## 2018-07-25 DIAGNOSIS — M545 Low back pain: Secondary | ICD-10-CM | POA: Diagnosis not present

## 2018-07-31 ENCOUNTER — Ambulatory Visit (INDEPENDENT_AMBULATORY_CARE_PROVIDER_SITE_OTHER): Payer: Medicare HMO | Admitting: Urology

## 2018-07-31 DIAGNOSIS — N401 Enlarged prostate with lower urinary tract symptoms: Secondary | ICD-10-CM

## 2018-08-05 IMAGING — CR DG CHEST 1V PORT
1 series · 1 of 1 positions shown · non-contrast
Comparison: 09/02/2016

CLINICAL DATA: Congestive heart failure

EXAM:
PORTABLE CHEST 1 VIEW

[ap portable]
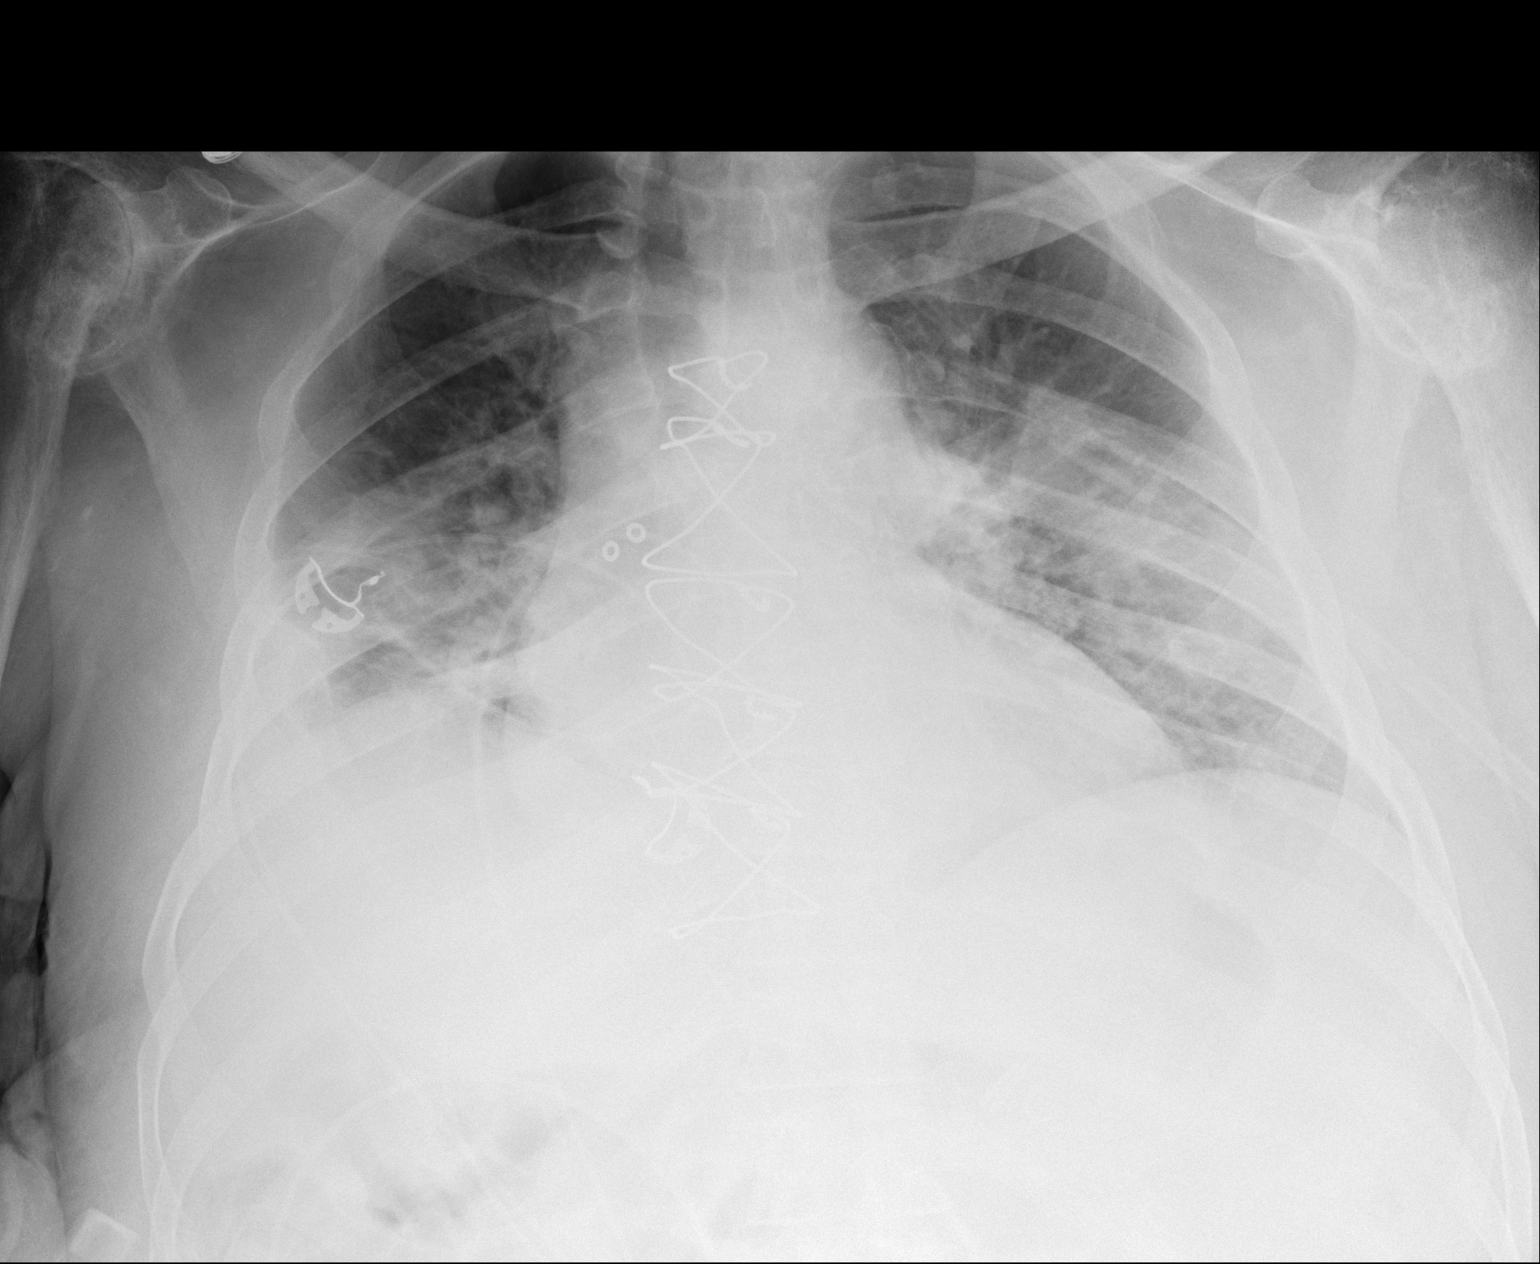

[1 of 1 positions shown; findings below may reference images not displayed]

FINDINGS: Cardiomegaly again noted. Status post CABG. Slight worsening in
aeration. Central vascular congestion and mild interstitial
prominence bilaterally suspicious for pulmonary edema. Small right
pleural effusion with right basilar atelectasis or infiltrate.
Extensive degenerative changes bilateral shoulders.
IMPRESSION: Slight worsening in aeration. Central vascular congestion and mild
interstitial prominence bilaterally suspicious for pulmonary edema.
Small right pleural effusion with right basilar atelectasis or
infiltrate. Extensive degenerative changes bilateral shoulders.

## 2018-09-04 ENCOUNTER — Ambulatory Visit: Payer: Medicare HMO | Admitting: Urology

## 2018-09-14 ENCOUNTER — Ambulatory Visit: Payer: Medicare HMO | Admitting: Urology

## 2018-09-18 ENCOUNTER — Ambulatory Visit (INDEPENDENT_AMBULATORY_CARE_PROVIDER_SITE_OTHER): Payer: Medicare HMO | Admitting: Urology

## 2018-09-18 DIAGNOSIS — R338 Other retention of urine: Secondary | ICD-10-CM | POA: Diagnosis not present

## 2018-09-20 ENCOUNTER — Other Ambulatory Visit: Payer: Self-pay | Admitting: Family Medicine

## 2018-09-20 NOTE — Telephone Encounter (Signed)
Ok six mo 

## 2018-10-09 ENCOUNTER — Encounter: Payer: Self-pay | Admitting: Gastroenterology

## 2018-10-13 ENCOUNTER — Other Ambulatory Visit: Payer: Self-pay | Admitting: Family Medicine

## 2018-10-15 DIAGNOSIS — R809 Proteinuria, unspecified: Secondary | ICD-10-CM | POA: Diagnosis not present

## 2018-10-15 DIAGNOSIS — N183 Chronic kidney disease, stage 3 (moderate): Secondary | ICD-10-CM | POA: Diagnosis not present

## 2018-10-15 DIAGNOSIS — I10 Essential (primary) hypertension: Secondary | ICD-10-CM | POA: Diagnosis not present

## 2018-10-15 DIAGNOSIS — Z79899 Other long term (current) drug therapy: Secondary | ICD-10-CM | POA: Diagnosis not present

## 2018-10-15 DIAGNOSIS — E559 Vitamin D deficiency, unspecified: Secondary | ICD-10-CM | POA: Diagnosis not present

## 2018-10-15 DIAGNOSIS — D509 Iron deficiency anemia, unspecified: Secondary | ICD-10-CM | POA: Diagnosis not present

## 2018-10-16 DIAGNOSIS — Z23 Encounter for immunization: Secondary | ICD-10-CM | POA: Diagnosis not present

## 2018-10-16 DIAGNOSIS — M059 Rheumatoid arthritis with rheumatoid factor, unspecified: Secondary | ICD-10-CM | POA: Diagnosis not present

## 2018-10-16 DIAGNOSIS — E1122 Type 2 diabetes mellitus with diabetic chronic kidney disease: Secondary | ICD-10-CM | POA: Diagnosis not present

## 2018-10-16 DIAGNOSIS — Z89512 Acquired absence of left leg below knee: Secondary | ICD-10-CM | POA: Diagnosis not present

## 2018-10-16 DIAGNOSIS — E11319 Type 2 diabetes mellitus with unspecified diabetic retinopathy without macular edema: Secondary | ICD-10-CM | POA: Diagnosis not present

## 2018-10-16 DIAGNOSIS — E1151 Type 2 diabetes mellitus with diabetic peripheral angiopathy without gangrene: Secondary | ICD-10-CM | POA: Diagnosis not present

## 2018-10-16 DIAGNOSIS — G8929 Other chronic pain: Secondary | ICD-10-CM | POA: Diagnosis not present

## 2018-10-16 DIAGNOSIS — I2510 Atherosclerotic heart disease of native coronary artery without angina pectoris: Secondary | ICD-10-CM | POA: Diagnosis not present

## 2018-10-16 DIAGNOSIS — Z7952 Long term (current) use of systemic steroids: Secondary | ICD-10-CM | POA: Diagnosis not present

## 2018-10-16 DIAGNOSIS — N183 Chronic kidney disease, stage 3 (moderate): Secondary | ICD-10-CM | POA: Diagnosis not present

## 2018-10-16 NOTE — Telephone Encounter (Signed)
No, this must be provided by pt's specfialist

## 2018-10-17 DIAGNOSIS — R809 Proteinuria, unspecified: Secondary | ICD-10-CM | POA: Diagnosis not present

## 2018-10-17 DIAGNOSIS — N183 Chronic kidney disease, stage 3 (moderate): Secondary | ICD-10-CM | POA: Diagnosis not present

## 2018-10-17 DIAGNOSIS — D638 Anemia in other chronic diseases classified elsewhere: Secondary | ICD-10-CM | POA: Diagnosis not present

## 2018-10-18 ENCOUNTER — Ambulatory Visit (INDEPENDENT_AMBULATORY_CARE_PROVIDER_SITE_OTHER): Payer: Medicare HMO | Admitting: Family Medicine

## 2018-10-18 ENCOUNTER — Encounter: Payer: Self-pay | Admitting: Family Medicine

## 2018-10-18 VITALS — Temp 98.2°F | Ht 68.0 in

## 2018-10-18 DIAGNOSIS — I10 Essential (primary) hypertension: Secondary | ICD-10-CM

## 2018-10-18 DIAGNOSIS — R413 Other amnesia: Secondary | ICD-10-CM

## 2018-10-18 DIAGNOSIS — Z1211 Encounter for screening for malignant neoplasm of colon: Secondary | ICD-10-CM | POA: Diagnosis not present

## 2018-10-18 DIAGNOSIS — E785 Hyperlipidemia, unspecified: Secondary | ICD-10-CM

## 2018-10-18 DIAGNOSIS — Z0000 Encounter for general adult medical examination without abnormal findings: Secondary | ICD-10-CM

## 2018-10-18 DIAGNOSIS — Z79899 Other long term (current) drug therapy: Secondary | ICD-10-CM | POA: Diagnosis not present

## 2018-10-18 DIAGNOSIS — D638 Anemia in other chronic diseases classified elsewhere: Secondary | ICD-10-CM

## 2018-10-18 DIAGNOSIS — S0990XA Unspecified injury of head, initial encounter: Secondary | ICD-10-CM

## 2018-10-18 DIAGNOSIS — I5020 Unspecified systolic (congestive) heart failure: Secondary | ICD-10-CM

## 2018-10-18 DIAGNOSIS — E119 Type 2 diabetes mellitus without complications: Secondary | ICD-10-CM

## 2018-10-18 LAB — POCT GLYCOSYLATED HEMOGLOBIN (HGB A1C): HEMOGLOBIN A1C: 7.9 % — AB (ref 4.0–5.6)

## 2018-10-18 MED ORDER — INSULIN ASPART PROT & ASPART (70-30 MIX) 100 UNIT/ML ~~LOC~~ SUSP: SUBCUTANEOUS | 5 refills | Status: DC
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
## 2018-10-19 LAB — TSH: TSH: 1.66 u[IU]/mL (ref 0.450–4.500)

## 2018-10-19 LAB — FOLATE: Folate: 12.1 ng/mL (ref >3.0)

## 2018-10-19 LAB — HEPATIC FUNCTION PANEL
ALT: 12 IU/L (ref 0–44)
AST: 11 IU/L (ref 0–40)
Albumin: 3.5 g/dL — ABNORMAL LOW (ref 3.6–4.8)
Alkaline Phosphatase: 99 IU/L (ref 39–117)
Bilirubin Total: 0.3 mg/dL (ref 0.0–1.2)
Bilirubin, Direct: 0.17 mg/dL (ref 0.00–0.40)
Total Protein: 6.3 g/dL (ref 6.0–8.5)

## 2018-10-19 LAB — LIPID PANEL
CHOL/HDL RATIO: 2.1 ratio (ref 0.0–5.0)
Cholesterol, Total: 142 mg/dL (ref 100–199)
HDL: 68 mg/dL (ref >39)
LDL Calculated: 59 mg/dL (ref 0–99)
TRIGLYCERIDES: 77 mg/dL (ref 0–149)
VLDL Cholesterol Cal: 15 mg/dL (ref 5–40)

## 2018-10-19 LAB — BASIC METABOLIC PANEL
BUN/Creatinine Ratio: 18 (ref 10–24)
BUN: 25 mg/dL (ref 8–27)
CALCIUM: 9.3 mg/dL (ref 8.6–10.2)
CHLORIDE: 101 mmol/L (ref 96–106)
CO2: 28 mmol/L (ref 20–29)
CREATININE: 1.36 mg/dL — AB (ref 0.76–1.27)
GFR calc Af Amer: 61 mL/min/{1.73_m2} (ref >59)
GFR calc non Af Amer: 53 mL/min/{1.73_m2} — ABNORMAL LOW (ref >59)
GLUCOSE: 160 mg/dL — AB (ref 65–99)
Potassium: 4.6 mmol/L (ref 3.5–5.2)
Sodium: 141 mmol/L (ref 134–144)

## 2018-10-19 LAB — VITAMIN B12: VITAMIN B 12: 671 pg/mL (ref 232–1245)

## 2018-10-23 ENCOUNTER — Encounter: Payer: Self-pay | Admitting: Family Medicine

## 2018-10-24 ENCOUNTER — Encounter: Payer: Self-pay | Admitting: Gastroenterology

## 2018-10-26 ENCOUNTER — Ambulatory Visit (HOSPITAL_COMMUNITY): Payer: Medicare HMO

## 2018-10-27 ENCOUNTER — Other Ambulatory Visit: Payer: Self-pay | Admitting: Family Medicine

## 2018-10-29 DIAGNOSIS — C44519 Basal cell carcinoma of skin of other part of trunk: Secondary | ICD-10-CM | POA: Diagnosis not present

## 2018-10-29 DIAGNOSIS — C44619 Basal cell carcinoma of skin of left upper limb, including shoulder: Secondary | ICD-10-CM | POA: Diagnosis not present

## 2018-10-29 DIAGNOSIS — L570 Actinic keratosis: Secondary | ICD-10-CM | POA: Diagnosis not present

## 2018-10-29 DIAGNOSIS — D485 Neoplasm of uncertain behavior of skin: Secondary | ICD-10-CM | POA: Diagnosis not present

## 2018-11-04 ENCOUNTER — Encounter: Payer: Self-pay | Admitting: Family Medicine

## 2018-11-06 ENCOUNTER — Ambulatory Visit: Payer: 59 | Admitting: Urology

## 2018-11-09 ENCOUNTER — Ambulatory Visit (INDEPENDENT_AMBULATORY_CARE_PROVIDER_SITE_OTHER): Payer: 59 | Admitting: Urology

## 2018-11-09 DIAGNOSIS — R338 Other retention of urine: Secondary | ICD-10-CM | POA: Diagnosis not present

## 2018-11-09 DIAGNOSIS — N401 Enlarged prostate with lower urinary tract symptoms: Secondary | ICD-10-CM | POA: Diagnosis not present

## 2018-11-21 ENCOUNTER — Ambulatory Visit (HOSPITAL_COMMUNITY): Payer: 59

## 2018-11-29 DIAGNOSIS — C44519 Basal cell carcinoma of skin of other part of trunk: Secondary | ICD-10-CM | POA: Diagnosis not present

## 2018-11-29 DIAGNOSIS — C44612 Basal cell carcinoma of skin of right upper limb, including shoulder: Secondary | ICD-10-CM | POA: Diagnosis not present

## 2018-11-30 ENCOUNTER — Ambulatory Visit: Payer: 59 | Admitting: Gastroenterology

## 2018-11-30 ENCOUNTER — Other Ambulatory Visit: Payer: Self-pay | Admitting: Family Medicine

## 2018-12-03 ENCOUNTER — Ambulatory Visit (HOSPITAL_COMMUNITY): Payer: Medicare HMO

## 2018-12-07 ENCOUNTER — Ambulatory Visit: Payer: Medicare HMO | Admitting: Urology

## 2018-12-11 ENCOUNTER — Ambulatory Visit: Payer: Medicare HMO | Admitting: Urology

## 2018-12-13 DIAGNOSIS — L570 Actinic keratosis: Secondary | ICD-10-CM | POA: Diagnosis not present

## 2018-12-14 ENCOUNTER — Ambulatory Visit (INDEPENDENT_AMBULATORY_CARE_PROVIDER_SITE_OTHER): Payer: Medicare HMO | Admitting: Urology

## 2018-12-14 DIAGNOSIS — N401 Enlarged prostate with lower urinary tract symptoms: Secondary | ICD-10-CM

## 2018-12-20 ENCOUNTER — Ambulatory Visit (HOSPITAL_COMMUNITY)
Admission: RE | Admit: 2018-12-20 | Discharge: 2018-12-20 | Disposition: A | Discharge status: Home / Self Care | Payer: Medicare HMO | Source: Ambulatory Visit | Attending: Family Medicine | Admitting: Family Medicine

## 2018-12-20 DIAGNOSIS — S0990XA Unspecified injury of head, initial encounter: Secondary | ICD-10-CM | POA: Diagnosis not present

## 2018-12-20 DIAGNOSIS — R413 Other amnesia: Secondary | ICD-10-CM | POA: Diagnosis not present

## 2018-12-20 DIAGNOSIS — R51 Headache: Secondary | ICD-10-CM | POA: Diagnosis not present

## 2018-12-21 ENCOUNTER — Telehealth: Payer: Self-pay | Admitting: *Deleted

## 2018-12-21 NOTE — Telephone Encounter (Signed)
Discussed with pt's wife who is on pt's dpr.

## 2018-12-21 NOTE — Telephone Encounter (Signed)
Notify pt scan shows no stroke, no tumor, no abnomrality other than normal for age, this is good news  f u as sched

## 2018-12-21 NOTE — Telephone Encounter (Signed)
Opened phone message for dr Richardson Landry

## 2018-12-28 ENCOUNTER — Other Ambulatory Visit: Payer: Self-pay | Admitting: Family Medicine

## 2019-01-08 ENCOUNTER — Ambulatory Visit (INDEPENDENT_AMBULATORY_CARE_PROVIDER_SITE_OTHER): Payer: Medicare HMO | Admitting: Family Medicine

## 2019-01-08 ENCOUNTER — Encounter: Payer: Self-pay | Admitting: Family Medicine

## 2019-01-08 VITALS — BP 118/70 | Ht 68.0 in

## 2019-01-08 DIAGNOSIS — R413 Other amnesia: Secondary | ICD-10-CM

## 2019-01-08 DIAGNOSIS — E119 Type 2 diabetes mellitus without complications: Secondary | ICD-10-CM | POA: Diagnosis not present

## 2019-01-08 DIAGNOSIS — E785 Hyperlipidemia, unspecified: Secondary | ICD-10-CM | POA: Diagnosis not present

## 2019-01-08 DIAGNOSIS — I10 Essential (primary) hypertension: Secondary | ICD-10-CM

## 2019-01-08 DIAGNOSIS — R490 Dysphonia: Secondary | ICD-10-CM

## 2019-01-08 DIAGNOSIS — R053 Chronic cough: Secondary | ICD-10-CM

## 2019-01-08 DIAGNOSIS — R05 Cough: Secondary | ICD-10-CM | POA: Diagnosis not present

## 2019-01-08 DIAGNOSIS — S0990XA Unspecified injury of head, initial encounter: Secondary | ICD-10-CM

## 2019-01-08 LAB — POCT GLYCOSYLATED HEMOGLOBIN (HGB A1C): HEMOGLOBIN A1C: 6.3 % — AB (ref 4.0–5.6)

## 2019-01-08 MED ORDER — AMOXICILLIN 500 MG PO CAPS: 500.0000 mg | ORAL_CAPSULE | Freq: Three times a day (TID) | ORAL | 0 refills | Status: DC
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
## 2019-01-18 ENCOUNTER — Encounter: Payer: Self-pay | Admitting: Family Medicine

## 2019-01-21 ENCOUNTER — Ambulatory Visit (HOSPITAL_COMMUNITY)
Admission: RE | Admit: 2019-01-21 | Discharge: 2019-01-21 | Disposition: A | Discharge status: Home / Self Care | Payer: Medicare HMO | Source: Ambulatory Visit | Attending: Family Medicine | Admitting: Family Medicine

## 2019-01-21 DIAGNOSIS — R05 Cough: Secondary | ICD-10-CM | POA: Insufficient documentation

## 2019-01-23 ENCOUNTER — Ambulatory Visit: Payer: Medicare HMO | Admitting: Urology

## 2019-02-05 ENCOUNTER — Other Ambulatory Visit: Payer: Self-pay | Admitting: Family Medicine

## 2019-02-05 NOTE — Telephone Encounter (Signed)
6 mo worth both

## 2019-02-05 NOTE — Telephone Encounter (Signed)
Still 6 mo worth

## 2019-02-06 ENCOUNTER — Ambulatory Visit: Payer: Medicare HMO | Admitting: Urology

## 2019-02-06 DIAGNOSIS — R338 Other retention of urine: Secondary | ICD-10-CM

## 2019-02-06 DIAGNOSIS — N401 Enlarged prostate with lower urinary tract symptoms: Secondary | ICD-10-CM | POA: Diagnosis not present

## 2019-02-20 NOTE — Progress Notes (Deleted)
2009 TCS by Dr. Oneida Alar  Slightly tortuous colon.  Otherwise, normal colon without evidence      of polyps, masses, inflammatory changes or diverticular AVMs.  2. Small internal hemorrhoids.  Otherwise, normal retroflexed view of      the rectum.

## 2019-02-21 ENCOUNTER — Ambulatory Visit: Payer: Medicare HMO | Admitting: Gastroenterology

## 2019-02-21 ENCOUNTER — Ambulatory Visit (INDEPENDENT_AMBULATORY_CARE_PROVIDER_SITE_OTHER): Payer: Medicare HMO | Admitting: Otolaryngology

## 2019-02-21 ENCOUNTER — Other Ambulatory Visit: Payer: Self-pay | Admitting: Family Medicine

## 2019-03-06 ENCOUNTER — Ambulatory Visit: Payer: Self-pay | Admitting: Urology

## 2019-03-25 ENCOUNTER — Other Ambulatory Visit: Payer: Self-pay | Admitting: Family Medicine

## 2019-03-25 NOTE — Telephone Encounter (Signed)
May ref times 3 mo

## 2019-04-23 ENCOUNTER — Ambulatory Visit (INDEPENDENT_AMBULATORY_CARE_PROVIDER_SITE_OTHER): Payer: Medicare HMO | Admitting: Urology

## 2019-04-23 DIAGNOSIS — R338 Other retention of urine: Secondary | ICD-10-CM

## 2019-05-01 ENCOUNTER — Other Ambulatory Visit: Payer: Self-pay | Admitting: Family Medicine

## 2019-05-06 ENCOUNTER — Other Ambulatory Visit: Payer: Self-pay

## 2019-05-06 ENCOUNTER — Ambulatory Visit (INDEPENDENT_AMBULATORY_CARE_PROVIDER_SITE_OTHER): Payer: Medicare HMO | Admitting: Family Medicine

## 2019-05-06 DIAGNOSIS — R490 Dysphonia: Secondary | ICD-10-CM | POA: Diagnosis not present

## 2019-05-06 DIAGNOSIS — E785 Hyperlipidemia, unspecified: Secondary | ICD-10-CM | POA: Diagnosis not present

## 2019-05-06 DIAGNOSIS — E119 Type 2 diabetes mellitus without complications: Secondary | ICD-10-CM | POA: Diagnosis not present

## 2019-05-06 DIAGNOSIS — I10 Essential (primary) hypertension: Secondary | ICD-10-CM

## 2019-05-06 MED ORDER — TAMSULOSIN HCL 0.4 MG PO CAPS: 0.4000 mg | ORAL_CAPSULE | Freq: Every day | ORAL | 5 refills | Status: AC

## 2019-05-06 MED ORDER — ATORVASTATIN CALCIUM 80 MG PO TABS: 80.0000 mg | ORAL_TABLET | Freq: Every day | ORAL | 5 refills | Status: DC

## 2019-05-06 MED ORDER — METOPROLOL SUCCINATE ER 25 MG PO TB24: ORAL_TABLET | ORAL | 5 refills | Status: DC

## 2019-05-06 MED ORDER — TORSEMIDE 10 MG PO TABS: ORAL_TABLET | ORAL | 5 refills | Status: AC

## 2019-05-06 MED ORDER — PREGABALIN 25 MG PO CAPS: 25.0000 mg | ORAL_CAPSULE | Freq: Every day | ORAL | 5 refills | Status: DC

## 2019-05-06 MED ORDER — INSULIN ASPART PROT & ASPART (70-30 MIX) 100 UNIT/ML ~~LOC~~ SUSP: SUBCUTANEOUS | 5 refills | Status: AC

## 2019-05-06 MED ORDER — AMITRIPTYLINE HCL 50 MG PO TABS: ORAL_TABLET | ORAL | 5 refills | Status: AC

## 2019-05-06 MED ORDER — CEFDINIR 300 MG PO CAPS: ORAL_CAPSULE | ORAL | 0 refills | Status: DC

## 2019-05-06 NOTE — Progress Notes (Signed)
   Subjective:    Patient ID: [name], male    DOB: Jun 25, 1951, 68 y.o.   MRN: [id number] Audio only Diabetes  He presents for his follow-up diabetic visit. He has type 2 diabetes mellitus. There are no hypoglycemic associated symptoms. There are no diabetic associated symptoms. There are no hypoglycemic complications. There are no diabetic complications.  Hypertension  This is a chronic problem. Risk factors for coronary artery disease include diabetes mellitus. There are no compliance problems.   Hyperlipidemia  This is a chronic problem. There are no compliance problems.  Risk factors for coronary artery disease include diabetes mellitus and hypertension.   Pt having concerns due to coughing up green mucus daily. Pt states this has been going on for 6-8 months but has became worse.   Virtual Visit via Video Note  I connected with [name] on 05/06/19 at  2:00 PM EDT by a video enabled telemedicine application and verified that I am speaking with the correct person using two identifiers.  Location: Patient: home Provider: office   I discussed the limitations of evaluation and management by telemedicine and the availability of in person appointments. The patient expressed understanding and agreed to proceed.  History of Present Illness:    Observations/Objective:   Assessment and Plan:   Follow Up Instructions:    I discussed the assessment and treatment plan with the patient. The patient was provided an opportunity to ask questions and all were answered. The patient agreed with the plan and demonstrated an understanding of the instructions.   The patient was advised to call back or seek an in-person evaluation if the symptoms worsen or if the condition fails to improve as anticipated.  I provided 25 minutes of non-face-to-face time during this encounter.  Patient continues to manifest substantial hoarseness, has had it off and on for years.  More recently since  requiring intubation with hospitalization a couple of years ago.  Would like for it to be gone if at all possible.  Also concerned about it. [name] Males, LPN  No headache, no major weight loss or weight gain, no chest pain no back pain abdominal pain no change in bowel habits complete ROS otherwise negative   Review of Systems     Objective:   Physical Exam   Virtual visit     Assessment & Plan:  Impression 1 hypertension good control discussed maintain same meds  2.  Hyperlipidemia prior blood work reviewed discussed maintain same  3.  Type 2 diabetes.  Sugar control discussed.  Compliance discussed.  Diet discussed  4.  Chronic hoarseness  5.  Elements of subacute bronchitis  ENT referral.  Trial of another round of antibiotics.  Other medications maintain.  Follow-up in 4 months as discussed diet exercise discussed

## 2019-05-07 ENCOUNTER — Ambulatory Visit: Payer: Medicare HMO | Admitting: Gastroenterology

## 2019-05-07 ENCOUNTER — Encounter: Payer: Self-pay | Admitting: Family Medicine

## 2019-05-07 NOTE — Addendum Note (Signed)
Addended by: [name] on: 05/07/2019 11:58 AM   Modules accepted: Level of Service

## 2019-05-31 ENCOUNTER — Ambulatory Visit: Payer: Medicare HMO | Admitting: Urology

## 2019-06-01 ENCOUNTER — Other Ambulatory Visit: Payer: Self-pay | Admitting: Family Medicine

## 2019-06-03 NOTE — Telephone Encounter (Signed)
Six mo worth 

## 2019-06-05 ENCOUNTER — Encounter: Payer: Self-pay | Admitting: Family Medicine

## 2019-06-11 ENCOUNTER — Ambulatory Visit: Payer: Medicare HMO | Admitting: Urology

## 2019-06-12 ENCOUNTER — Ambulatory Visit: Payer: Medicare HMO | Admitting: Gastroenterology

## 2019-06-22 DIAGNOSIS — R0902 Hypoxemia: Secondary | ICD-10-CM | POA: Diagnosis not present

## 2019-06-22 DIAGNOSIS — Z209 Contact with and (suspected) exposure to unspecified communicable disease: Secondary | ICD-10-CM | POA: Diagnosis not present

## 2019-06-22 DIAGNOSIS — R079 Chest pain, unspecified: Secondary | ICD-10-CM | POA: Diagnosis not present

## 2019-06-22 DIAGNOSIS — I959 Hypotension, unspecified: Secondary | ICD-10-CM | POA: Diagnosis not present

## 2019-06-22 DIAGNOSIS — I499 Cardiac arrhythmia, unspecified: Secondary | ICD-10-CM | POA: Diagnosis not present

## 2019-06-24 ENCOUNTER — Ambulatory Visit (INDEPENDENT_AMBULATORY_CARE_PROVIDER_SITE_OTHER): Payer: Medicare HMO | Admitting: Family Medicine

## 2019-06-24 ENCOUNTER — Other Ambulatory Visit: Payer: Self-pay

## 2019-06-24 DIAGNOSIS — R079 Chest pain, unspecified: Secondary | ICD-10-CM | POA: Diagnosis not present

## 2019-06-24 MED ORDER — CLONAZEPAM 0.25 MG PO TBDP: ORAL_TABLET | ORAL | 1 refills | Status: DC
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
## 2019-06-25 ENCOUNTER — Ambulatory Visit (INDEPENDENT_AMBULATORY_CARE_PROVIDER_SITE_OTHER): Payer: Medicare HMO | Admitting: Urology

## 2019-06-25 ENCOUNTER — Encounter: Payer: Self-pay | Admitting: Family Medicine

## 2019-06-25 DIAGNOSIS — R338 Other retention of urine: Secondary | ICD-10-CM | POA: Diagnosis not present

## 2019-07-01 ENCOUNTER — Emergency Department (HOSPITAL_COMMUNITY): Payer: Medicare HMO

## 2019-07-01 ENCOUNTER — Other Ambulatory Visit: Payer: Self-pay

## 2019-07-01 ENCOUNTER — Inpatient Hospital Stay (HOSPITAL_COMMUNITY): Payer: Medicare HMO

## 2019-07-01 ENCOUNTER — Encounter (HOSPITAL_COMMUNITY): Payer: Self-pay

## 2019-07-01 ENCOUNTER — Inpatient Hospital Stay (HOSPITAL_COMMUNITY)
Admission: EM | Admit: 2019-07-01 | Discharge: 2019-07-03 | DRG: 640 | Disposition: A | Discharge status: Home / Self Care | Payer: Medicare HMO | Attending: Internal Medicine | Admitting: Internal Medicine

## 2019-07-01 DIAGNOSIS — M05712 Rheumatoid arthritis with rheumatoid factor of left shoulder without organ or systems involvement: Secondary | ICD-10-CM

## 2019-07-01 DIAGNOSIS — E1151 Type 2 diabetes mellitus with diabetic peripheral angiopathy without gangrene: Secondary | ICD-10-CM | POA: Diagnosis present

## 2019-07-01 DIAGNOSIS — I5032 Chronic diastolic (congestive) heart failure: Secondary | ICD-10-CM | POA: Diagnosis not present

## 2019-07-01 DIAGNOSIS — K5900 Constipation, unspecified: Secondary | ICD-10-CM | POA: Diagnosis present

## 2019-07-01 DIAGNOSIS — E860 Dehydration: Secondary | ICD-10-CM | POA: Diagnosis not present

## 2019-07-01 DIAGNOSIS — K8020 Calculus of gallbladder without cholecystitis without obstruction: Secondary | ICD-10-CM | POA: Diagnosis not present

## 2019-07-01 DIAGNOSIS — Z823 Family history of stroke: Secondary | ICD-10-CM

## 2019-07-01 DIAGNOSIS — F1120 Opioid dependence, uncomplicated: Secondary | ICD-10-CM | POA: Diagnosis not present

## 2019-07-01 DIAGNOSIS — I361 Nonrheumatic tricuspid (valve) insufficiency: Secondary | ICD-10-CM | POA: Diagnosis not present

## 2019-07-01 DIAGNOSIS — I10 Essential (primary) hypertension: Secondary | ICD-10-CM | POA: Diagnosis not present

## 2019-07-01 DIAGNOSIS — M05711 Rheumatoid arthritis with rheumatoid factor of right shoulder without organ or systems involvement: Secondary | ICD-10-CM | POA: Diagnosis not present

## 2019-07-01 DIAGNOSIS — I255 Ischemic cardiomyopathy: Secondary | ICD-10-CM | POA: Diagnosis present

## 2019-07-01 DIAGNOSIS — I4891 Unspecified atrial fibrillation: Secondary | ICD-10-CM | POA: Diagnosis present

## 2019-07-01 DIAGNOSIS — R001 Bradycardia, unspecified: Secondary | ICD-10-CM | POA: Diagnosis not present

## 2019-07-01 DIAGNOSIS — R740 Nonspecific elevation of levels of transaminase and lactic acid dehydrogenase [LDH]: Secondary | ICD-10-CM

## 2019-07-01 DIAGNOSIS — Z20828 Contact with and (suspected) exposure to other viral communicable diseases: Secondary | ICD-10-CM | POA: Diagnosis not present

## 2019-07-01 DIAGNOSIS — R54 Age-related physical debility: Secondary | ICD-10-CM | POA: Diagnosis present

## 2019-07-01 DIAGNOSIS — I130 Hypertensive heart and chronic kidney disease with heart failure and stage 1 through stage 4 chronic kidney disease, or unspecified chronic kidney disease: Secondary | ICD-10-CM | POA: Diagnosis not present

## 2019-07-01 DIAGNOSIS — G894 Chronic pain syndrome: Secondary | ICD-10-CM | POA: Diagnosis present

## 2019-07-01 DIAGNOSIS — E785 Hyperlipidemia, unspecified: Secondary | ICD-10-CM | POA: Diagnosis present

## 2019-07-01 DIAGNOSIS — Z833 Family history of diabetes mellitus: Secondary | ICD-10-CM

## 2019-07-01 DIAGNOSIS — R7401 Elevation of levels of liver transaminase levels: Secondary | ICD-10-CM

## 2019-07-01 DIAGNOSIS — Z955 Presence of coronary angioplasty implant and graft: Secondary | ICD-10-CM

## 2019-07-01 DIAGNOSIS — E1122 Type 2 diabetes mellitus with diabetic chronic kidney disease: Secondary | ICD-10-CM | POA: Diagnosis present

## 2019-07-01 DIAGNOSIS — R143 Flatulence: Secondary | ICD-10-CM | POA: Diagnosis not present

## 2019-07-01 DIAGNOSIS — R079 Chest pain, unspecified: Secondary | ICD-10-CM | POA: Diagnosis not present

## 2019-07-01 DIAGNOSIS — N179 Acute kidney failure, unspecified: Secondary | ICD-10-CM | POA: Diagnosis not present

## 2019-07-01 DIAGNOSIS — K760 Fatty (change of) liver, not elsewhere classified: Secondary | ICD-10-CM | POA: Diagnosis present

## 2019-07-01 DIAGNOSIS — I447 Left bundle-branch block, unspecified: Secondary | ICD-10-CM | POA: Diagnosis not present

## 2019-07-01 DIAGNOSIS — S2690XA Unspecified injury of heart, unspecified with or without hemopericardium, initial encounter: Secondary | ICD-10-CM

## 2019-07-01 DIAGNOSIS — IMO0001 Reserved for inherently not codable concepts without codable children: Secondary | ICD-10-CM

## 2019-07-01 DIAGNOSIS — R490 Dysphonia: Secondary | ICD-10-CM | POA: Diagnosis present

## 2019-07-01 DIAGNOSIS — R778 Other specified abnormalities of plasma proteins: Secondary | ICD-10-CM | POA: Diagnosis present

## 2019-07-01 DIAGNOSIS — I2510 Atherosclerotic heart disease of native coronary artery without angina pectoris: Secondary | ICD-10-CM | POA: Diagnosis present

## 2019-07-01 DIAGNOSIS — Z882 Allergy status to sulfonamides status: Secondary | ICD-10-CM

## 2019-07-01 DIAGNOSIS — E1140 Type 2 diabetes mellitus with diabetic neuropathy, unspecified: Secondary | ICD-10-CM | POA: Diagnosis present

## 2019-07-01 DIAGNOSIS — Z7982 Long term (current) use of aspirin: Secondary | ICD-10-CM

## 2019-07-01 DIAGNOSIS — Z96642 Presence of left artificial hip joint: Secondary | ICD-10-CM | POA: Diagnosis present

## 2019-07-01 DIAGNOSIS — R1110 Vomiting, unspecified: Secondary | ICD-10-CM | POA: Diagnosis present

## 2019-07-01 DIAGNOSIS — N183 Chronic kidney disease, stage 3 (moderate): Secondary | ICD-10-CM | POA: Diagnosis present

## 2019-07-01 DIAGNOSIS — Z951 Presence of aortocoronary bypass graft: Secondary | ICD-10-CM

## 2019-07-01 DIAGNOSIS — I248 Other forms of acute ischemic heart disease: Secondary | ICD-10-CM | POA: Diagnosis not present

## 2019-07-01 DIAGNOSIS — Z7952 Long term (current) use of systemic steroids: Secondary | ICD-10-CM

## 2019-07-01 DIAGNOSIS — I959 Hypotension, unspecified: Secondary | ICD-10-CM | POA: Diagnosis present

## 2019-07-01 DIAGNOSIS — Z79899 Other long term (current) drug therapy: Secondary | ICD-10-CM

## 2019-07-01 DIAGNOSIS — I340 Nonrheumatic mitral (valve) insufficiency: Secondary | ICD-10-CM | POA: Diagnosis not present

## 2019-07-01 DIAGNOSIS — Z209 Contact with and (suspected) exposure to unspecified communicable disease: Secondary | ICD-10-CM | POA: Diagnosis not present

## 2019-07-01 DIAGNOSIS — I350 Nonrheumatic aortic (valve) stenosis: Secondary | ICD-10-CM | POA: Diagnosis present

## 2019-07-01 DIAGNOSIS — M549 Dorsalgia, unspecified: Secondary | ICD-10-CM | POA: Diagnosis present

## 2019-07-01 DIAGNOSIS — I499 Cardiac arrhythmia, unspecified: Secondary | ICD-10-CM | POA: Diagnosis not present

## 2019-07-01 DIAGNOSIS — Z8249 Family history of ischemic heart disease and other diseases of the circulatory system: Secondary | ICD-10-CM

## 2019-07-01 DIAGNOSIS — E1165 Type 2 diabetes mellitus with hyperglycemia: Secondary | ICD-10-CM | POA: Diagnosis not present

## 2019-07-01 DIAGNOSIS — R7989 Other specified abnormal findings of blood chemistry: Secondary | ICD-10-CM | POA: Diagnosis not present

## 2019-07-01 DIAGNOSIS — Z794 Long term (current) use of insulin: Secondary | ICD-10-CM

## 2019-07-01 DIAGNOSIS — R570 Cardiogenic shock: Secondary | ICD-10-CM | POA: Diagnosis not present

## 2019-07-01 DIAGNOSIS — M069 Rheumatoid arthritis, unspecified: Secondary | ICD-10-CM | POA: Diagnosis present

## 2019-07-01 DIAGNOSIS — Z993 Dependence on wheelchair: Secondary | ICD-10-CM

## 2019-07-01 DIAGNOSIS — Z89512 Acquired absence of left leg below knee: Secondary | ICD-10-CM

## 2019-07-01 LAB — BASIC METABOLIC PANEL
Anion gap: 18 — ABNORMAL HIGH (ref 5–15)
BUN: 33 mg/dL — ABNORMAL HIGH (ref 8–23)
CO2: 23 mmol/L (ref 22–32)
Calcium: 8.4 mg/dL — ABNORMAL LOW (ref 8.9–10.3)
Chloride: 96 mmol/L — ABNORMAL LOW (ref 98–111)
Creatinine, Ser: 3.28 mg/dL — ABNORMAL HIGH (ref 0.61–1.24)
GFR calc Af Amer: 21 mL/min — ABNORMAL LOW (ref >60)
GFR calc non Af Amer: 18 mL/min — ABNORMAL LOW (ref >60)
Glucose, Bld: 261 mg/dL — ABNORMAL HIGH (ref 70–99)
Potassium: 4.2 mmol/L (ref 3.5–5.1)
Sodium: 137 mmol/L (ref 135–145)

## 2019-07-01 LAB — POCT I-STAT EG7
Acid-base deficit: 2 mmol/L (ref 0.0–2.0)
Bicarbonate: 23.8 mmol/L (ref 20.0–28.0)
Calcium, Ion: 1.12 mmol/L — ABNORMAL LOW (ref 1.15–1.40)
HCT: 30 % — ABNORMAL LOW (ref 39.0–52.0)
Hemoglobin: 10.2 g/dL — ABNORMAL LOW (ref 13.0–17.0)
O2 Saturation: 44 %
Potassium: 4.4 mmol/L (ref 3.5–5.1)
Sodium: 136 mmol/L (ref 135–145)
TCO2: 25 mmol/L (ref 22–32)
pCO2, Ven: 45.6 mmHg (ref 44.0–60.0)
pH, Ven: 7.326 (ref 7.250–7.430)
pO2, Ven: 27 mmHg — CL (ref 32.0–45.0)

## 2019-07-01 LAB — GLUCOSE, CAPILLARY: Glucose-Capillary: 187 mg/dL — ABNORMAL HIGH (ref 70–99)

## 2019-07-01 LAB — HEPATIC FUNCTION PANEL
ALT: 130 U/L — ABNORMAL HIGH (ref 0–44)
AST: 157 U/L — ABNORMAL HIGH (ref 15–41)
Albumin: 2.7 g/dL — ABNORMAL LOW (ref 3.5–5.0)
Alkaline Phosphatase: 68 U/L (ref 38–126)
Bilirubin, Direct: 0.5 mg/dL — ABNORMAL HIGH (ref 0.0–0.2)
Indirect Bilirubin: 0.9 mg/dL (ref 0.3–0.9)
Total Bilirubin: 1.4 mg/dL — ABNORMAL HIGH (ref 0.3–1.2)
Total Protein: 5.6 g/dL — ABNORMAL LOW (ref 6.5–8.1)

## 2019-07-01 LAB — BRAIN NATRIURETIC PEPTIDE: B Natriuretic Peptide: 3872.1 pg/mL — ABNORMAL HIGH (ref 0.0–100.0)

## 2019-07-01 LAB — SARS CORONAVIRUS 2 BY RT PCR (HOSPITAL ORDER, PERFORMED IN ~~LOC~~ HOSPITAL LAB): SARS Coronavirus 2: NEGATIVE

## 2019-07-01 LAB — CBC
HCT: 32.3 % — ABNORMAL LOW (ref 39.0–52.0)
Hemoglobin: 10.2 g/dL — ABNORMAL LOW (ref 13.0–17.0)
MCH: 30.6 pg (ref 26.0–34.0)
MCHC: 31.6 g/dL (ref 30.0–36.0)
MCV: 97 fL (ref 80.0–100.0)
Platelets: 181 10*3/uL (ref 150–400)
RBC: 3.33 MIL/uL — ABNORMAL LOW (ref 4.22–5.81)
RDW: 13.6 % (ref 11.5–15.5)
WBC: 12.6 10*3/uL — ABNORMAL HIGH (ref 4.0–10.5)
nRBC: 0 % (ref 0.0–0.2)

## 2019-07-01 LAB — MRSA PCR SCREENING: MRSA by PCR: NEGATIVE

## 2019-07-01 LAB — CBG MONITORING, ED: Glucose-Capillary: 201 mg/dL — ABNORMAL HIGH (ref 70–99)

## 2019-07-01 LAB — TROPONIN I (HIGH SENSITIVITY)
Troponin I (High Sensitivity): 600 ng/L (ref <18)
Troponin I (High Sensitivity): 567 ng/L (ref <18)

## 2019-07-01 LAB — LACTIC ACID, PLASMA: Lactic Acid, Venous: 5.2 mmol/L (ref 0.5–1.9)

## 2019-07-01 LAB — LIPASE, BLOOD: Lipase: 18 U/L (ref 11–51)

## 2019-07-01 MED ORDER — EPINEPHRINE 1 MG/10ML IJ SOSY
PREFILLED_SYRINGE | INTRAMUSCULAR | Status: AC | PRN
  Administered 2019-07-01: 1 mg via INTRAVENOUS

## 2019-07-01 MED ORDER — ENOXAPARIN SODIUM 40 MG/0.4ML ~~LOC~~ SOLN: 40.0000 mg | SUBCUTANEOUS | Status: DC

## 2019-07-01 MED ORDER — ACETAMINOPHEN 650 MG RE SUPP: 650.0000 mg | Freq: Four times a day (QID) | RECTAL | Status: DC | PRN

## 2019-07-01 MED ORDER — HYDROMORPHONE HCL 1 MG/ML IJ SOLN: 0.5000 mg | INTRAMUSCULAR | Status: DC | PRN

## 2019-07-01 MED ORDER — PREDNISONE 5 MG PO TABS
5.0000 mg | ORAL_TABLET | Freq: Every day | ORAL | Status: DC
  Administered 2019-07-02 – 2019-07-03 (×2): 5 mg via ORAL
  Filled 2019-07-01 (×2): qty 1

## 2019-07-01 MED ORDER — TAMSULOSIN HCL 0.4 MG PO CAPS
0.4000 mg | ORAL_CAPSULE | Freq: Every day | ORAL | Status: DC
  Administered 2019-07-01 – 2019-07-02 (×2): 0.4 mg via ORAL
  Filled 2019-07-01 (×2): qty 1

## 2019-07-01 MED ORDER — SODIUM CHLORIDE 0.9 % IV BOLUS (SEPSIS)
500.0000 mL | Freq: Once | INTRAVENOUS | Status: AC
  Administered 2019-07-01: 500 mL via INTRAVENOUS

## 2019-07-01 MED ORDER — POLYETHYLENE GLYCOL 3350 17 GM/SCOOP PO POWD: 17.0000 g | Freq: Two times a day (BID) | ORAL | Status: DC

## 2019-07-01 MED ORDER — PREGABALIN 25 MG PO CAPS
25.0000 mg | ORAL_CAPSULE | Freq: Every day | ORAL | Status: DC
  Administered 2019-07-01 – 2019-07-02 (×2): 25 mg via ORAL
  Filled 2019-07-01 (×2): qty 1

## 2019-07-01 MED ORDER — INSULIN ASPART 100 UNIT/ML ~~LOC~~ SOLN
0.0000 [IU] | SUBCUTANEOUS | Status: DC
  Administered 2019-07-01: 3 [IU] via SUBCUTANEOUS
  Administered 2019-07-02 (×2): 1 [IU] via SUBCUTANEOUS
  Administered 2019-07-02 – 2019-07-03 (×5): 2 [IU] via SUBCUTANEOUS

## 2019-07-01 MED ORDER — HEPARIN SODIUM (PORCINE) 5000 UNIT/ML IJ SOLN
5000.0000 [IU] | Freq: Three times a day (TID) | INTRAMUSCULAR | Status: DC
  Administered 2019-07-01 – 2019-07-03 (×4): 5000 [IU] via SUBCUTANEOUS
  Filled 2019-07-01 (×4): qty 1

## 2019-07-01 MED ORDER — ONDANSETRON HCL 4 MG/2ML IJ SOLN: 4.0000 mg | Freq: Four times a day (QID) | INTRAMUSCULAR | Status: DC | PRN

## 2019-07-01 MED ORDER — EPINEPHRINE 1 MG/10ML IJ SOSY
PREFILLED_SYRINGE | INTRAMUSCULAR | Status: AC
  Filled 2019-07-01: qty 10

## 2019-07-01 MED ORDER — SODIUM BICARBONATE 8.4 % IV SOLN
INTRAVENOUS | Status: AC | PRN
  Administered 2019-07-01: 50 meq via INTRAVENOUS

## 2019-07-01 MED ORDER — HYDROXYCHLOROQUINE SULFATE 200 MG PO TABS: 200.0000 mg | ORAL_TABLET | Freq: Every day | ORAL | Status: DC

## 2019-07-01 MED ORDER — ASPIRIN EC 81 MG PO TBEC
81.0000 mg | DELAYED_RELEASE_TABLET | Freq: Every day | ORAL | Status: DC
  Administered 2019-07-02 – 2019-07-03 (×2): 81 mg via ORAL
  Filled 2019-07-01 (×2): qty 1

## 2019-07-01 MED ORDER — CLONAZEPAM 0.25 MG PO TBDP: 0.2500 mg | ORAL_TABLET | Freq: Every day | ORAL | Status: DC | PRN

## 2019-07-01 MED ORDER — ATORVASTATIN CALCIUM 80 MG PO TABS
80.0000 mg | ORAL_TABLET | Freq: Every day | ORAL | Status: DC
  Administered 2019-07-02: 80 mg via ORAL
  Filled 2019-07-01: qty 1

## 2019-07-01 MED ORDER — ASPIRIN 325 MG PO TABS
325.0000 mg | ORAL_TABLET | Freq: Once | ORAL | Status: AC
  Administered 2019-07-01: 325 mg via ORAL
  Filled 2019-07-01: qty 1

## 2019-07-01 MED ORDER — METHADONE HCL 10 MG PO TABS
10.0000 mg | ORAL_TABLET | Freq: Two times a day (BID) | ORAL | Status: DC
  Administered 2019-07-02 – 2019-07-03 (×3): 10 mg via ORAL
  Filled 2019-07-01 (×3): qty 1

## 2019-07-01 MED ORDER — POLYETHYLENE GLYCOL 3350 17 G PO PACK
17.0000 g | PACK | Freq: Two times a day (BID) | ORAL | Status: DC
  Administered 2019-07-01 – 2019-07-03 (×3): 17 g via ORAL
  Filled 2019-07-01 (×3): qty 1

## 2019-07-01 MED ORDER — POLYVINYL ALCOHOL-POVIDONE PF 1.4-0.6 % OP SOLN: Freq: Every day | OPHTHALMIC | Status: DC | PRN

## 2019-07-01 MED ORDER — ONDANSETRON HCL 4 MG PO TABS: 4.0000 mg | ORAL_TABLET | Freq: Four times a day (QID) | ORAL | Status: DC | PRN

## 2019-07-01 MED ORDER — DOCUSATE SODIUM 100 MG PO CAPS
100.0000 mg | ORAL_CAPSULE | Freq: Three times a day (TID) | ORAL | Status: DC
  Administered 2019-07-01 – 2019-07-03 (×4): 100 mg via ORAL
  Filled 2019-07-01 (×4): qty 1

## 2019-07-01 MED ORDER — POLYVINYL ALCOHOL 1.4 % OP SOLN
1.0000 [drp] | Freq: Every day | OPHTHALMIC | Status: DC | PRN
  Filled 2019-07-01: qty 15

## 2019-07-01 MED ORDER — ACETAMINOPHEN 325 MG PO TABS: 650.0000 mg | ORAL_TABLET | Freq: Four times a day (QID) | ORAL | Status: DC | PRN

## 2019-07-01 MED ORDER — DULOXETINE HCL 30 MG PO CPEP
30.0000 mg | ORAL_CAPSULE | Freq: Every day | ORAL | Status: DC
  Administered 2019-07-02 – 2019-07-03 (×2): 30 mg via ORAL
  Filled 2019-07-01 (×2): qty 1

## 2019-07-01 MED ORDER — SODIUM CHLORIDE 0.9 % IV SOLN: 1000.0000 mL | INTRAVENOUS | Status: DC

## 2019-07-01 MED ORDER — FINASTERIDE 5 MG PO TABS
5.0000 mg | ORAL_TABLET | Freq: Every day | ORAL | Status: DC
  Administered 2019-07-02 – 2019-07-03 (×2): 5 mg via ORAL
  Filled 2019-07-01 (×2): qty 1

## 2019-07-01 NOTE — ED Triage Notes (Signed)
Per [facility], pt from home after wife found him very lethargic and concerned that he wasn't breathing. On scene, pt was lethargic, with a HR of 29-45, then pt had run of vtach. Given 1mg  atropine, 565ml of NS. NO change to HR. BP 91/41. Axox4.

## 2019-07-01 NOTE — ED Provider Notes (Addendum)
[facility] EMERGENCY DEPARTMENT Provider Note   CSN: [id number] Arrival date & time: 07/01/19  1632    History   Chief Complaint Chief Complaint  Patient presents with   Bradycardia    HPI [name] is a 68 y.o. male.     HPI Patient states that a few days ago he started having trouble with vomiting and pain in his upper abdomen and chest.  Patient states he has not been able to keep anything down.  He denies having any diarrhea but has not had a bowel movement a couple days.  Patient began feeling very weak and lightheaded.  EMS was called.  They noted that the patient was bradycardic and hypotensive.  He was given a dose of atropine without much relief.  Patient denies any trouble with fevers.  He is not currently feeling short of breath.  He denies any chest pain right now.  Does have history of multiple medical problems and states he previously was on dialysis but not currently. 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 failure) ([place]) 09/02/2016   Acute renal failure superimposed on stage 3 chronic kidney disease ([place]) 09/02/2016   HCAP  (healthcare-associated pneumonia) 09/02/2016   Hyperkalemia 09/02/2016   Sacral decubitus ulcer 09/02/2016   Pressure ulcer 08/27/2016   ESRD needing dialysis ([place]) 08/26/2016   Acute hematogenous osteomyelitis of left foot ([place]) 08/09/2016   Cellulitis and abscess of lower extremity 05/29/2016   Anemia of chronic disease 04/27/2016   Status post amputation of toe of left foot ([place]) 01/75/1025   Systolic heart failure ([place]) 07/09/2015   Bradycardia 08/14/2014   Prolonged QT interval 08/14/2014   Gait disorder 08/14/2014   Seizure ([place]) 08/13/2014   Pancytopenia ([place]) 08/13/2014   ARF (acute renal failure) (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 Authorizing Provider  amitriptyline (ELAVIL) 50 MG tablet TAKE 1 AND 1/2 TABLETS BY MOUTH AT BEDTIME. Patient taking differently: Take 75 mg by mouth at bedtime.  05/06/19  Yes [name], MD  aspirin EC 81 MG tablet Take 81 mg by mouth daily.   Yes [provider]  atorvastatin (LIPITOR) 80 MG tablet TAKE ONE TABLET BY MOUTH ONCE DAILY. Patient taking differently: Take 80 mg by mouth daily.  06/03/19  Yes [name], MD  Cholecalciferol (VITAMIN D3) 25 MCG (1000 UT) CHEW Chew 1 tablet by mouth daily.   Yes [provider]  clonazePAM (KLONOPIN) 0.25 MG disintegrating tablet One po qd prn anxiety Patient taking differently:  Take 0.25 mg by mouth daily as needed (anxiety).  06/24/19  Yes [name], MD  docusate sodium (COLACE) 100 MG capsule Take 100 mg by mouth 3 (three) times daily.    Yes [provider]  DULoxetine (CYMBALTA) 30 MG capsule Take 30 mg by mouth daily.  10/16/18 10/16/19 Yes [provider]  finasteride (PROSCAR) 5 MG tablet Take 5 mg by mouth daily.  04/24/18  Yes [provider]  hydroxychloroquine (PLAQUENIL) 200 MG tablet Take 200 mg by mouth daily.  04/24/18  Yes [provider]  insulin aspart protamine- aspart (NOVOLOG MIX 70/30) (70-30) 100 UNIT/ML injection Inject 12 units into skin in the morning  and 8 units evening Patient taking differently: Inject 8-12 Units into the skin See admin instructions. Inject 12 units subcutaneously after breakfast and 8 units after supper 05/06/19  Yes [name], MD  methadone (DOLOPHINE) 10 MG tablet Take 10 mg by mouth every 6 (six) hours.  04/18/18  Yes [provider]  methadone (DOLOPHINE) 5 MG tablet Take 5 mg by mouth 3 (three) times daily as needed (breakthrough pain).  04/18/18  Yes [provider]  metoprolol succinate (TOPROL-XL) 25 MG 24 hr tablet TAKE 1 AND 1/2 TABLETS BY MOUTH EVERY MORNING. Patient taking differently: Take 37.5 mg by mouth daily.  05/06/19  Yes [name], MD  polyethylene glycol powder (MIRALAX) powder Take 17 g by mouth 2 (two) times daily. 03/01/18  Yes [name], MD  Polyvinyl Alcohol-Povidone (REFRESH OP) Place 1 drop into both eyes daily as needed (dry eyes).   Yes [provider]  predniSONE (DELTASONE) 5 MG tablet TAKE 1 TABLET BY MOUTH ONCE A DAY WITH BREAKFAST. Patient taking differently: Take 5 mg by mouth daily with breakfast.  06/03/19  Yes [name], MD  pregabalin (LYRICA) 25 MG capsule TAKE ONE CAPSULE BY MOUTH AT BEDTIME. Patient taking differently: Take 25 mg by mouth at bedtime.  06/03/19  Yes [name], MD    tamsulosin (FLOMAX) 0.4 MG CAPS capsule Take 1 capsule (0.4 mg total) by mouth at bedtime. 05/06/19  Yes [name], MD  torsemide (DEMADEX) 10 MG tablet TAKE 2 TABLETS BY MOUTH EVERY OTHER DAY. Patient taking differently: Take 20 mg by mouth every other day.  05/06/19  Yes [name], MD    Family History Family History  Problem Relation Age of Onset   Diabetes Father    Dementia Father    Heart attack Brother        multiple brothers with MIs   Hypertension Mother    Stroke Mother     Social History Social History   Tobacco Use   Smoking status: Never Smoker   Smokeless tobacco: Never Used  Substance Use Topics   Alcohol use: No   Drug use: No     Allergies   Sulfa antibiotics   Review of Systems Review of Systems  All other  systems reviewed and are negative.    Physical Exam Updated Vital Signs BP 96/74    Pulse 64    Resp 18    SpO2 94%   Physical Exam Vitals signs and nursing note reviewed.  Constitutional:      Appearance: He is well-developed. He is ill-appearing and toxic-appearing.  HENT:     Head: Normocephalic and atraumatic.     Right Ear: External ear normal.     Left Ear: External ear normal.     Mouth/Throat:     Comments: Mucous membranes are dry Eyes:     General: No scleral icterus.       Right eye: No discharge.        Left eye: No discharge.     Conjunctiva/sclera: Conjunctivae normal.  Neck:     Musculoskeletal: Neck supple.     Trachea: No tracheal deviation.  Cardiovascular:     Rate and Rhythm: Regular rhythm. Bradycardia present.  Pulmonary:     Effort: Pulmonary effort is normal. No respiratory distress.     Breath sounds: Normal breath sounds. No stridor. No wheezing or rales.  Abdominal:     General: Bowel sounds are normal. There is no distension.     Palpations: Abdomen is soft.     Tenderness: There is no abdominal tenderness. There is no guarding or rebound.  Musculoskeletal:        General:  No tenderness.  Skin:    General: Skin is warm and dry.     Findings: No rash.  Neurological:     Mental Status: He is alert.     Cranial Nerves: No cranial nerve deficit (no facial droop, extraocular movements intact, no slurred speech).     Sensory: No sensory deficit.     Motor: No abnormal muscle tone or seizure activity.     Coordination: Coordination normal.      ED Treatments / Results  Labs (all labs ordered are listed, but only abnormal results are displayed) Labs Reviewed  BASIC METABOLIC PANEL - Abnormal; Notable for the following components:      Result Value   Chloride 96 (*)    Glucose, Bld 261 (*)    BUN 33 (*)    Creatinine, Ser 3.28 (*)    Calcium 8.4 (*)    GFR calc non Af Amer 18 (*)    GFR calc Af Amer 21 (*)    Anion gap 18 (*)    All other components within normal limits  CBC - Abnormal; Notable for the following components:   WBC 12.6 (*)    RBC 3.33 (*)    Hemoglobin 10.2 (*)    HCT 32.3 (*)    All other components within normal limits  TROPONIN I (HIGH SENSITIVITY) - Abnormal; Notable for the following components:   Troponin I (High Sensitivity) 600 (*)    All other components within normal limits  BRAIN NATRIURETIC PEPTIDE - Abnormal; Notable for the following components:   B Natriuretic Peptide 3,872.1 (*)    All other components within normal limits  SARS CORONAVIRUS 2 (HOSPITAL ORDER, [place] [facility])  CULTURE, BLOOD (ROUTINE X 2)  CULTURE, BLOOD (ROUTINE X 2)  TROPONIN I (HIGH SENSITIVITY)  HEPATIC FUNCTION PANEL  BLOOD GAS, VENOUS  URINALYSIS, ROUTINE W REFLEX MICROSCOPIC  LACTIC ACID, PLASMA  LIPASE, BLOOD    EKG EKG Interpretation  Date/Time:  Monday July 01 2019 16:46:28 EDT Ventricular Rate:  59 PR Interval:  QRS Duration: 163 QT Interval:  580 QTC Calculation: 575 R Axis:   -44 Text Interpretation:  Atrial fibrillation Left bundle branch block bundle brach block new since last tracing Confirmed  by Dorie Rank [phone]) on 07/01/2019 5:51:40 PM   Radiology Dg Abdomen 1 View  Result Date: 07/01/2019 CLINICAL DATA:  Lethargic EXAM: ABDOMEN - 1 VIEW COMPARISON:  None. FINDINGS: Scattered large and small bowel gas is noted. No abnormal mass or abnormal calcifications are seen. No obstructive changes are noted. No free air is seen. Left hip replacement is noted. IMPRESSION: No acute abnormality seen. Electronically Signed   By: Inez Catalina M.D.   On: 07/01/2019 18:34   Dg Chest Portable 1 View  Result Date: 07/01/2019 CLINICAL DATA:  Lethargic with chest pain EXAM: PORTABLE CHEST 1 VIEW COMPARISON:  01/21/2019 FINDINGS: Postsurgical changes are again noted. Cardiac shadow is mildly enlarged but accentuated by the frontal technique. Right jugular central line is noted in satisfactory position. No pneumothorax is seen. Mild right basilar scarring is again noted. No focal infiltrate is seen. No bony abnormality is noted. IMPRESSION: Right jugular central line without pneumothorax. Right basilar scarring stable from the prior exam. Electronically Signed   By: Inez Catalina M.D.   On: 07/01/2019 18:27    Procedures .Critical Care Performed by: Dorie Rank, MD Authorized by: Dorie Rank, MD   Critical care provider statement:    Critical care time (minutes):  45   Critical care was time spent personally by me on the following activities:  Discussions with consultants, evaluation of patient's response to treatment, examination of patient, ordering and performing treatments and interventions, ordering and review of laboratory studies, ordering and review of radiographic studies, pulse oximetry, re-evaluation of patient's condition, obtaining history from patient or surrogate and review of old charts .Central Line  Date/Time: 07/01/2019 6:46 PM Performed by: Dorie Rank, MD Authorized by: Dorie Rank, MD   Consent:    Consent obtained:  Verbal   Consent given by:  Patient Universal protocol:     Procedure explained and questions answered to patient or proxy's satisfaction: yes     Relevant documents present and verified: yes     Immediately prior to procedure, a time out was called: yes     Patient identity confirmed:  Verbally with patient Pre-procedure details:    Hand hygiene: Hand hygiene performed prior to insertion     Sterile barrier technique: All elements of maximal sterile technique followed     Skin preparation:  2% chlorhexidine   Skin preparation agent: Skin preparation agent completely dried prior to procedure   Anesthesia (see MAR for exact dosages):    Anesthesia method:  Local infiltration   Local anesthetic:  Lidocaine 1% w/o epi Procedure details:    Location:  R internal jugular   Patient position:  Flat   Procedural supplies:  Triple lumen   Landmarks identified: yes     Ultrasound guidance: yes     Sterile ultrasound techniques: Sterile gel and sterile probe covers were used     Number of attempts:  1   Successful placement: yes   Post-procedure details:    Post-procedure:  Dressing applied and line sutured   Assessment:  Blood return through all ports   Patient tolerance of procedure:  Tolerated well, no immediate complications   (including critical care time)  Medications Ordered in ED Medications  EPINEPHrine (ADRENALIN) 1 MG/10ML injection (has no administration in time range)  sodium chloride 0.9 % bolus 500  mL (has no administration in time range)    Followed by  0.9 %  sodium chloride infusion (has no administration in time range)  EPINEPHrine (ADRENALIN) 1 MG/10ML injection (1 mg Intravenous Given 07/01/19 1643)  sodium bicarbonate injection (50 mEq Intravenous Given 07/01/19 1644)     Initial Impression / Assessment and Plan / ED Course  I have reviewed the triage vital signs and the nursing notes.  Pertinent labs & imaging results that were available during my care of the patient were reviewed by me and considered in my medical decision  making (see chart for details).  Clinical Course as of Jun 30 1936  Mon Jul 01, 2019  1723 Upon arrival the patient was given a dose of epinephrine as well as a dose of bicarb.  I was concerned about the possibility of hyperkalemia based on his bradycardic rhythm on the monitor.  Patient improved with that treatment.  His most recent heart rate is 59 and his blood pressure is greater than 465 systolic.   [KP]  5465 Patient's troponin is significantly elevated.  BNP is also elevated at 3800.  Patient denies chest pain currently   [JK]  1849 D/w Dr Cardiology.  Recommend medical admission, serial troponin, will follow along   [JK]  1901 Wife mentioned he has not been urinating much and just had a catheter removed.   [JK]  1937 D/w Dr Alcario Drought regarding admission   [JK]    Clinical Course User Index [JK] Dorie Rank, MD     Patient's work-up is notable for an elevated white blood cell count as well as an elevated troponin and BNP.  Lipase and hepatic panel are still pending.  He does have an AK I with an elevated BUN and creatinine.  No signs of hyperkalemia however.  I suspect the patient symptoms initially started with dehydration causing his AK I resulting in his weakness.  He likely has a component of myocardial injury associated with his hypotension although I doubt acute coronary syndrome at this time.  Patient has improved with IV fluids, the dose of epinephrine and bicarb.  He has remained stable over the last few hours since then.  Cardiology has been consulted.  I will consult the medical service.  Plan on admission for further treatment.  Final Clinical Impressions(s) / ED Diagnoses   Final diagnoses:  AKI (acute kidney injury) ([place])  Myocardial injury, initial encounter  Dehydration     Dorie Rank, MD 07/01/19 Marko Stai    Dorie Rank, MD 07/01/19 [phone]

## 2019-07-01 NOTE — ED Notes (Signed)
Attempted report x1. 

## 2019-07-01 NOTE — H&P (Signed)
History and Physical    [name] SINNING PYK:[id number] DOB: 1951-07-15 DOA: 07/01/2019  PCP: [name], MD  Patient coming from: Home  I have personally briefly reviewed patient's old medical records in [place]  Chief Complaint: Bradycardia  HPI: [name] is a 68 y.o. male with medical history significant of CKD stage 3, DM2 on insulin, CAD s/p PCI, chronic systolic and diastolic CHF, RA, chronic methadone use, HTN.  Patient had been having vomiting, upper abd pain and CP for past several days.  Patient states he has not been able to keep anything down.  He denies having any diarrhea but has not had a bowel movement a couple days.  Patient began feeling very weak and lightheaded.  Found lethargic by wife this afternoon apparently.  EMS was called.  They noted that the patient was bradycardic and hypotensive.  HR 29-45.  Had run of V tach.  Atropine without much improvement.   ED Course: BP 75 systolic on arrival with HR in 30s.  EKG shows LBBB, new since 2017.  EDP gave amp of bicarb and 1mg  epinephrine with good response to BP and HR.  Interestingly BP and HR response has been sustained despite several hours since epinephrine given.  Also got 500 NS bolus and NS at 125 cc/hr  Remainder of labs are concerning including: Creat 3.28 (1.36 baseline)  BUN 33 AST 157 ALT 130 Bladder scan only has ~150 despite a recent history of having foley catheter in place for retention and only removed recently. Lactate 5.2 Troponin 600  Patient with mild chest "soreness" still.  No SOB, no fevers.  COVID negative.  Cards consulted and felt that the trop elevation was demand ischemia due to another underlying medical condition and wanted medical admission.   Review of Systems: As per HPI otherwise 10 point review of systems negative.   Past Medical History:  Diagnosis Date  . Acute on chronic combined systolic and diastolic CHF (congestive heart failure) ([place]) 09/02/2016  .  Chronic back pain   . Chronic neck pain   . Coronary artery disease 1993   angioplasty after AMI  . Diabetes mellitus   . Diabetic neuropathy ([place])   . Hyperkalemia   . Hypertension   . Loculated pleural effusion 09/09/2016  . Pancytopenia ([place]) 08/13/2014  . 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  . Rheumatoid arthritis(714.0)   . Seizure ([place]) 08/13/2014   Pt denies    Past Surgical History:  Procedure Laterality Date  . CARDIAC SURGERY    . FRACTURE SURGERY    . JOINT REPLACEMENT    . LEG AMPUTATION  06/216   left leg  . MUSCLE BIOPSY  06/2016     reports that he has never smoked. He has never used smokeless tobacco. He reports that he does not drink alcohol or use drugs.  Allergies  Allergen Reactions  . Sulfa Antibiotics Rash    Family History  Problem Relation Age of Onset  . Diabetes Father   . Dementia Father   . Heart attack Brother        multiple brothers with MIs  . Hypertension Mother   . Stroke Mother      Prior to Admission medications   Medication Sig Start Date End Date Taking? Authorizing Provider  amitriptyline (ELAVIL) 50 MG tablet TAKE 1 AND 1/2 TABLETS BY MOUTH AT BEDTIME. Patient taking differently: Take 75 mg by mouth at bedtime.  05/06/19  Yes [name], MD  aspirin EC 81 MG tablet Take 81 mg by mouth daily.   Yes [provider]  atorvastatin (LIPITOR) 80 MG tablet TAKE ONE TABLET BY MOUTH ONCE DAILY. Patient taking differently: Take 80 mg by mouth daily.  06/03/19  Yes [name], MD  Cholecalciferol (VITAMIN D3) 25 MCG (1000 UT) CHEW Chew 1 tablet by mouth daily.   Yes [provider]  clonazePAM (KLONOPIN) 0.25 MG disintegrating tablet One po qd prn anxiety Patient taking differently: Take 0.25 mg by mouth daily as needed (anxiety).  06/24/19  Yes [name], MD  docusate sodium (COLACE) 100 MG capsule Take 100 mg by mouth 3 (three) times daily.    Yes  [provider]  DULoxetine (CYMBALTA) 30 MG capsule Take 30 mg by mouth daily.  10/16/18 10/16/19 Yes [provider]  finasteride (PROSCAR) 5 MG tablet Take 5 mg by mouth daily.  04/24/18  Yes [provider]  hydroxychloroquine (PLAQUENIL) 200 MG tablet Take 200 mg by mouth daily.  04/24/18  Yes [provider]  insulin aspart protamine- aspart (NOVOLOG MIX 70/30) (70-30) 100 UNIT/ML injection Inject 12 units into skin in the morning  and 8 units evening Patient taking differently: Inject 8-12 Units into the skin See admin instructions. Inject 12 units subcutaneously after breakfast and 8 units after supper 05/06/19  Yes [name], MD  methadone (DOLOPHINE) 10 MG tablet Take 10 mg by mouth every 6 (six) hours.  04/18/18  Yes [provider]  methadone (DOLOPHINE) 5 MG tablet Take 5 mg by mouth 3 (three) times daily as needed (breakthrough pain).  04/18/18  Yes [provider]  metoprolol succinate (TOPROL-XL) 25 MG 24 hr tablet TAKE 1 AND 1/2 TABLETS BY MOUTH EVERY MORNING. Patient taking differently: Take 37.5 mg by mouth daily.  05/06/19  Yes [name], MD  polyethylene glycol powder (MIRALAX) powder Take 17 g by mouth 2 (two) times daily. 03/01/18  Yes [name], MD  Polyvinyl Alcohol-Povidone (REFRESH OP) Place 1 drop into both eyes daily as needed (dry eyes).   Yes [provider]  predniSONE (DELTASONE) 5 MG tablet TAKE 1 TABLET BY MOUTH ONCE A DAY WITH BREAKFAST. Patient taking differently: Take 5 mg by mouth daily with breakfast.  06/03/19  Yes [name], MD  pregabalin (LYRICA) 25 MG capsule TAKE ONE CAPSULE BY MOUTH AT BEDTIME. Patient taking differently: Take 25 mg by mouth at bedtime.  06/03/19  Yes [name], MD  tamsulosin (FLOMAX) 0.4 MG CAPS capsule Take 1 capsule (0.4 mg total) by mouth at bedtime. 05/06/19  Yes [name], MD  torsemide (DEMADEX) 10 MG tablet TAKE 2 TABLETS BY MOUTH  EVERY OTHER DAY. Patient taking differently: Take 20 mg by mouth every other day.  05/06/19  Yes [name], MD    Physical Exam: Vitals:   07/01/19 1800 07/01/19 1815 07/01/19 1900 07/01/19 1930  BP: (!) 125/99 (!) 143/42 (!) 156/73 128/65  Pulse: 76 76 74 73  Resp: 17 (!) 34 18 (!) 21  SpO2: 98% 95% 100% 98%    Constitutional: NAD, calm, comfortable Eyes: PERRL, lids and conjunctivae normal ENMT: Mucous membranes are moist. Posterior pharynx clear of any exudate or lesions.Normal dentition.  Neck: normal, supple, no masses, no thyromegaly Respiratory: clear to auscultation bilaterally, no wheezing, no crackles. Normal respiratory effort. No accessory muscle use.  Cardiovascular: Regular rate and rhythm, no murmurs / rubs /  gallops. No extremity edema. 2+ pedal pulses. No carotid bruits.  Abdomen: no tenderness, no masses palpated. No hepatosplenomegaly. Bowel sounds positive.  Musculoskeletal: no clubbing / cyanosis. No joint deformity upper and lower extremities. Good ROM, no contractures. Normal muscle tone.  Skin: no rashes, lesions, ulcers. No induration Neurologic: CN 2-12 grossly intact. Sensation intact, DTR normal. Strength 5/5 in all 4.  Psychiatric: Normal judgment and insight. Alert and oriented x 3. Normal mood.    Labs on Admission: I have personally reviewed following labs and imaging studies  CBC: Recent Labs  Lab 07/01/19 1638 07/01/19 1906  WBC 12.6*  --   HGB 10.2* 10.2*  HCT 32.3* 30.0*  MCV 97.0  --   PLT 181  --    Basic Metabolic Panel: Recent Labs  Lab 07/01/19 1638 07/01/19 1906  NA 137 136  K 4.2 4.4  CL 96*  --   CO2 23  --   GLUCOSE 261*  --   BUN 33*  --   CREATININE 3.28*  --   CALCIUM 8.4*  --    GFR: CrCl cannot be calculated (Unknown ideal weight.). Liver Function Tests: Recent Labs  Lab 07/01/19 1855  AST 157*  ALT 130*  ALKPHOS 68  BILITOT 1.4*  PROT 5.6*  ALBUMIN 2.7*   Recent Labs  Lab 07/01/19 1855   LIPASE 18   No results for input(s): AMMONIA in the last 168 hours. Coagulation Profile: No results for input(s): INR, PROTIME in the last 168 hours. Cardiac Enzymes: No results for input(s): CKTOTAL, CKMB, CKMBINDEX, TROPONINI in the last 168 hours. BNP (last 3 results) No results for input(s): PROBNP in the last 8760 hours. HbA1C: No results for input(s): HGBA1C in the last 72 hours. CBG: Recent Labs  Lab 07/01/19 1948  GLUCAP 201*   Lipid Profile: No results for input(s): CHOL, HDL, LDLCALC, TRIG, CHOLHDL, LDLDIRECT in the last 72 hours. Thyroid Function Tests: No results for input(s): TSH, T4TOTAL, FREET4, T3FREE, THYROIDAB in the last 72 hours. Anemia Panel: No results for input(s): VITAMINB12, FOLATE, FERRITIN, TIBC, IRON, RETICCTPCT in the last 72 hours. Urine analysis:    Component Value Date/Time   COLORURINE YELLOW 10/15/2016 0152   APPEARANCEUR HAZY (A) 10/15/2016 0152   LABSPEC 1.015 10/15/2016 0152   PHURINE 6.0 10/15/2016 0152   GLUCOSEU NEGATIVE 10/15/2016 0152   HGBUR LARGE (A) 10/15/2016 0152   BILIRUBINUR NEGATIVE 10/15/2016 0152   KETONESUR NEGATIVE 10/15/2016 0152   PROTEINUR 100 (A) 10/15/2016 0152   UROBILINOGEN 0.2 08/12/2014 2358   NITRITE POSITIVE (A) 10/15/2016 0152   LEUKOCYTESUR LARGE (A) 10/15/2016 0152    Radiological Exams on Admission: Dg Abdomen 1 View  Result Date: 07/01/2019 CLINICAL DATA:  Lethargic EXAM: ABDOMEN - 1 VIEW COMPARISON:  None. FINDINGS: Scattered large and small bowel gas is noted. No abnormal mass or abnormal calcifications are seen. No obstructive changes are noted. No free air is seen. Left hip replacement is noted. IMPRESSION: No acute abnormality seen. Electronically Signed   By: Inez Catalina M.D.   On: 07/01/2019 18:34   Dg Chest Portable 1 View  Result Date: 07/01/2019 CLINICAL DATA:  Lethargic with chest pain EXAM: PORTABLE CHEST 1 VIEW COMPARISON:  01/21/2019 FINDINGS: Postsurgical changes are again noted.  Cardiac shadow is mildly enlarged but accentuated by the frontal technique. Right jugular central line is noted in satisfactory position. No pneumothorax is seen. Mild right basilar scarring is again noted. No focal infiltrate is seen. No bony abnormality is noted. IMPRESSION:  Right jugular central line without pneumothorax. Right basilar scarring stable from the prior exam. Electronically Signed   By: Inez Catalina M.D.   On: 07/01/2019 18:27    EKG: Independently reviewed.  Assessment/Plan Principal Problem:   Cardiogenic shock (HCC) Active Problems:   Rheumatoid arthritis ([place])   Hypertension   Bradycardia   Acute renal failure superimposed on stage 3 chronic kidney disease (HCC)   Opioid dependence on maintenance agonist therapy, no symptoms (HCC)   Elevated troponin   Hypotension   LBBB (left bundle branch block)   Current chronic use of systemic steroids    1. Shock - ?Cardiogenic 1. Patient with initial presentation of bradycardia, CP, hypotension, elevated lactate, elevated troponin.  Worrisome for initial cardiogenic shock.  Seems to have responded to IVF and initial epinephrine. 2. IVF: 500 cc bolus and NS at 125 cc/hr 3. Serial lactates 2. Elevated trop, bradycardia, initial hypotension, LBBB 1. Apparently resolved after initial epinephrine + IVF dose. 2. Tele monitor 3. Holding hydroxychloroquine and amatryptiline and betablocker 4. Serial trops 5. ASA 325 6. 2d echo ordered 7. Clear liquid diet for the moment 8. Cards consulted 3. Current chronic use of steroids - 1. Continue prednisone daily 2. If hypotension re-occurs, then go to stress dose steroids, will hold off for the moment though since BPs improved 4. AKF on CKD stage 3 - 1. Pre-renal due to hypotension?  EVO:[id number] ratio unexpectedly low for this though. 2. IVF 3. Strict intake and output 4. US renal 5. UA and urine lytes 6. Repeat CMP in AM 5. Transaminitis - 1. ? Secondary to recurrent N/V over past  several days vs shock liver vs ? 2. RUQ [place] 3. Repeat CMP in AM 6. RA - 1. Cont prednisone 2. Holding hydroxychloroquine 7. Chronic methadone use - 1. Resume half home methadone dosing due to AKF 2. PRN dilaudid for breakthrough pain if needed  DVT prophylaxis: Lovenox Code Status: Full Family Communication: No family in room Disposition Plan: Home after admit Consults called: EDP spoke with cards Admission status: Admit to inpatient  Severity of Illness: The appropriate patient status for this patient is INPATIENT. Inpatient status is judged to be reasonable and necessary in order to provide the required intensity of service to ensure the patient's safety. The patient's presenting symptoms, physical exam findings, and initial radiographic and laboratory data in the context of their chronic comorbidities is felt to place them at high risk for further clinical deterioration. Furthermore, it is not anticipated that the patient will be medically stable for discharge from the hospital within 2 midnights of admission. The following factors support the patient status of inpatient.   IP status for treatment of what appears to be shock state with multiple organ dysfunction and involvement.   * I certify that at the point of admission it is my clinical judgment that the patient will require inpatient hospital care spanning beyond 2 midnights from the point of admission due to high intensity of service, high risk for further deterioration and high frequency of surveillance required.*    ,  M. DO [facility]  How to contact the [facility] Attending or Consulting provider [place] or covering provider during after hours [place], for this patient?  1. Check the care team in [facility] and look for a) attending/consulting [facility] provider listed and b) the [facility] team listed 2. Log into [email or web address]  Amion Physician Scheduling and messaging for groups and whole hospitals  On call and physician  scheduling  software for group practices, residents, hospitalists and other medical providers for call, clinic, rotation and shift schedules. On[employer] is a hospital-wide system for scheduling doctors and paging doctors on call. EasyPlot is for scientific plotting and data analysis.  [email or web address]  and use [place]'s universal password to access. If you do not have the password, please contact the hospital operator.  3. Locate the [facility] provider you are looking for under [facility] and page to a number that you can be directly reached. 4. If you still have difficulty reaching the provider, please page the [facility] (Director on Call) for the Hospitalists listed on amion for assistance.  07/01/2019, 8:05 PM

## 2019-07-01 NOTE — Plan of Care (Signed)
Problem: Education: Goal: Knowledge of General Education information will improve Description: Including pain rating scale, medication(s)/side effects and non-pharmacologic comfort measures 07/01/2019 2339 by Brand Males, RN Outcome: Progressing 07/01/2019 2338 by Brand Males, RN Outcome: Progressing   Problem: Health Behavior/Discharge Planning: Goal: Ability to manage health-related needs will improve 07/01/2019 2339 by Brand Males, RN Outcome: Progressing 07/01/2019 2338 by Brand Males, RN Outcome: Progressing   Problem: Clinical Measurements: Goal: Ability to maintain clinical measurements within normal limits will improve 07/01/2019 2339 by Brand Males, RN Outcome: Progressing 07/01/2019 2338 by Brand Males, RN Outcome: Progressing Goal: Will remain free from infection 07/01/2019 2339 by Brand Males, RN Outcome: Progressing 07/01/2019 2338 by Brand Males, RN Outcome: Progressing Goal: Diagnostic test results will improve 07/01/2019 2339 by Brand Males, RN Outcome: Progressing 07/01/2019 2338 by Brand Males, RN Outcome: Progressing Goal: Respiratory complications will improve 07/01/2019 2339 by Brand Males, RN Outcome: Progressing 07/01/2019 2338 by Brand Males, RN Outcome: Progressing Goal: Cardiovascular complication will be avoided 07/01/2019 2339 by Brand Males, RN Outcome: Progressing 07/01/2019 2338 by Brand Males, RN Outcome: Progressing   Problem: Activity: Goal: Risk for activity intolerance will decrease 07/01/2019 2339 by Brand Males, RN Outcome: Progressing 07/01/2019 2338 by Brand Males, RN Outcome: Progressing   Problem: Nutrition: Goal: Adequate nutrition will be maintained 07/01/2019 2339 by Brand Males, RN Outcome: Progressing 07/01/2019 2338 by Brand Males, RN Outcome: Progressing   Problem: Coping: Goal: Level of anxiety will decrease 07/01/2019 2339 by Brand Males, RN Outcome:  Progressing 07/01/2019 2338 by Brand Males, RN Outcome: Progressing   Problem: Elimination: Goal: Will not experience complications related to bowel motility 07/01/2019 2339 by Brand Males, RN Outcome: Progressing 07/01/2019 2338 by Brand Males, RN Outcome: Progressing Goal: Will not experience complications related to urinary retention Outcome: Progressing   Problem: Pain Managment: Goal: General experience of comfort will improve 07/01/2019 2339 by Brand Males, RN Outcome: Progressing 07/01/2019 2338 by Brand Males, RN Outcome: Progressing   Problem: Safety: Goal: Ability to remain free from injury will improve 07/01/2019 2339 by Brand Males, RN Outcome: Progressing 07/01/2019 2338 by Brand Males, RN Outcome: Progressing   Problem: Skin Integrity: Goal: Risk for impaired skin integrity will decrease 07/01/2019 2339 by Brand Males, RN Outcome: Progressing 07/01/2019 2338 by Brand Males, RN Outcome: Progressing   Problem: Education: Goal: Knowledge of General Education information will improve Description: Including pain rating scale, medication(s)/side effects and non-pharmacologic comfort measures Outcome: Progressing   Problem: Health Behavior/Discharge Planning: Goal: Ability to manage health-related needs will improve Outcome: Progressing   Problem: Clinical Measurements: Goal: Ability to maintain clinical measurements within normal limits will improve Outcome: Progressing Goal: Will remain free from infection Outcome: Progressing Goal: Diagnostic test results will improve Outcome: Progressing Goal: Respiratory complications will improve Outcome: Progressing Goal: Cardiovascular complication will be avoided Outcome: Progressing   Problem: Activity: Goal: Risk for activity intolerance will decrease Outcome: Progressing   Problem: Nutrition: Goal: Adequate nutrition will be maintained Outcome: Progressing   Problem:  Coping: Goal: Level of anxiety will decrease Outcome: Progressing   Problem: Elimination: Goal: Will not experience complications related to bowel motility Outcome: Progressing Goal: Will not experience complications related to urinary retention Outcome: Progressing   Problem: Pain Managment: Goal: General experience of comfort will improve Outcome: Progressing   Problem: Safety: Goal: Ability to remain free from injury will  improve Outcome: Progressing   Problem: Skin Integrity: Goal: Risk for impaired skin integrity will decrease Outcome: Progressing

## 2019-07-02 ENCOUNTER — Inpatient Hospital Stay (HOSPITAL_COMMUNITY): Payer: Medicare HMO

## 2019-07-02 DIAGNOSIS — I361 Nonrheumatic tricuspid (valve) insufficiency: Secondary | ICD-10-CM

## 2019-07-02 DIAGNOSIS — I447 Left bundle-branch block, unspecified: Secondary | ICD-10-CM

## 2019-07-02 DIAGNOSIS — R001 Bradycardia, unspecified: Secondary | ICD-10-CM

## 2019-07-02 DIAGNOSIS — N179 Acute kidney failure, unspecified: Secondary | ICD-10-CM

## 2019-07-02 DIAGNOSIS — I10 Essential (primary) hypertension: Secondary | ICD-10-CM

## 2019-07-02 DIAGNOSIS — N183 Chronic kidney disease, stage 3 (moderate): Secondary | ICD-10-CM

## 2019-07-02 DIAGNOSIS — I350 Nonrheumatic aortic (valve) stenosis: Secondary | ICD-10-CM

## 2019-07-02 DIAGNOSIS — R7989 Other specified abnormal findings of blood chemistry: Secondary | ICD-10-CM

## 2019-07-02 DIAGNOSIS — I340 Nonrheumatic mitral (valve) insufficiency: Secondary | ICD-10-CM

## 2019-07-02 DIAGNOSIS — R570 Cardiogenic shock: Secondary | ICD-10-CM

## 2019-07-02 LAB — CBC
HCT: 26.1 % — ABNORMAL LOW (ref 39.0–52.0)
Hemoglobin: 8.5 g/dL — ABNORMAL LOW (ref 13.0–17.0)
MCH: 31.1 pg (ref 26.0–34.0)
MCHC: 32.6 g/dL (ref 30.0–36.0)
MCV: 95.6 fL (ref 80.0–100.0)
Platelets: 106 10*3/uL — ABNORMAL LOW (ref 150–400)
RBC: 2.73 MIL/uL — ABNORMAL LOW (ref 4.22–5.81)
RDW: 13.6 % (ref 11.5–15.5)
WBC: 7.7 10*3/uL (ref 4.0–10.5)
nRBC: 0 % (ref 0.0–0.2)

## 2019-07-02 LAB — COMPREHENSIVE METABOLIC PANEL
ALT: 233 U/L — ABNORMAL HIGH (ref 0–44)
AST: 297 U/L — ABNORMAL HIGH (ref 15–41)
Albumin: 2.6 g/dL — ABNORMAL LOW (ref 3.5–5.0)
Alkaline Phosphatase: 65 U/L (ref 38–126)
Anion gap: 8 (ref 5–15)
BUN: 33 mg/dL — ABNORMAL HIGH (ref 8–23)
CO2: 27 mmol/L (ref 22–32)
Calcium: 8.4 mg/dL — ABNORMAL LOW (ref 8.9–10.3)
Chloride: 102 mmol/L (ref 98–111)
Creatinine, Ser: 2.54 mg/dL — ABNORMAL HIGH (ref 0.61–1.24)
GFR calc Af Amer: 29 mL/min — ABNORMAL LOW (ref >60)
GFR calc non Af Amer: 25 mL/min — ABNORMAL LOW (ref >60)
Glucose, Bld: 137 mg/dL — ABNORMAL HIGH (ref 70–99)
Potassium: 4.3 mmol/L (ref 3.5–5.1)
Sodium: 137 mmol/L (ref 135–145)
Total Bilirubin: 1 mg/dL (ref 0.3–1.2)
Total Protein: 5.3 g/dL — ABNORMAL LOW (ref 6.5–8.1)

## 2019-07-02 LAB — GLUCOSE, CAPILLARY
Glucose-Capillary: 141 mg/dL — ABNORMAL HIGH (ref 70–99)
Glucose-Capillary: 125 mg/dL — ABNORMAL HIGH (ref 70–99)
Glucose-Capillary: 158 mg/dL — ABNORMAL HIGH (ref 70–99)
Glucose-Capillary: 163 mg/dL — ABNORMAL HIGH (ref 70–99)
Glucose-Capillary: 216 mg/dL — ABNORMAL HIGH (ref 70–99)

## 2019-07-02 LAB — ECHOCARDIOGRAM COMPLETE
Height: 71 in
Weight: 2737.23 oz

## 2019-07-02 LAB — CORTISOL: Cortisol, Plasma: 7.4 ug/dL

## 2019-07-02 LAB — LACTIC ACID, PLASMA: Lactic Acid, Venous: 1.4 mmol/L (ref 0.5–1.9)

## 2019-07-02 MED ORDER — PERFLUTREN LIPID MICROSPHERE
1.0000 mL | INTRAVENOUS | Status: AC | PRN
  Administered 2019-07-02: 3 mL via INTRAVENOUS
  Filled 2019-07-02: qty 10

## 2019-07-02 MED ORDER — ORAL CARE MOUTH RINSE
15.0000 mL | Freq: Two times a day (BID) | OROMUCOSAL | Status: DC
  Administered 2019-07-02 – 2019-07-03 (×2): 15 mL via OROMUCOSAL

## 2019-07-02 NOTE — Progress Notes (Addendum)
  Echocardiogram 2D Echocardiogram has been performed with [facility].  [name] 07/02/2019, 3:46 PM

## 2019-07-02 NOTE — Consult Note (Addendum)
Cardiology Consultation:   Patient ID: [name] MRN: [id number]; DOB: 08-Jun-1951  Admit date: 07/01/2019 Date of Consult: 07/02/2019  Primary Care Provider: [name], MD Primary Cardiologist: Dr. Harl [name] (R)   Patient Profile:   [name] is a 68 y.o. male with a hx of CAD status post prior stenting and CABG, postop atrial fibrillation, chronic combined CHF, ischemic cardiomyopathy, hypertension, hyperlipidemia, diabetes, mild to moderate aortic stenosis, peripheral vascular disease status post left BKA, rheumatoid arthritis, chronic kidney disease stage III, chronic hoarseness and chronic pain syndrome who is being seen today for the evaluation of possible cardiogenic shock at the request of Dr. Tawanna [name].   Patient has longstanding history of CAD with prior angioplasty in 1993 and 1995. He had CABG 06/2015 at [facility] with LIMA-LAD, SVG-PDA, SVG-intermediate with post op afib. LVEF prior to CABG 30-35%. After CABG LVEF improved to 45-50%. Last echo 08/2016 showed LVEF 30-35%, grade II diastolic dysfunction.   He was seen by Dr. Harl [name] February 2018 to establish cardiac care.  No follow-up since then.  History of Present Illness:   [name] had chronic Foley/catheter for past 3 years which was removed about a week ago.  This was followed by his nephrologist in [place], [place].  About a week ago patient had an episode of chest pain at rest and EMS was called.  He continued to have intermittent sharp pain.  No work-up.  Patient is a wheelchair-bound.  Patient has not voided properly or had bowel movement for past 3 days since removal of his Foley.  Yesterday found very lethargic, dyspneic and lightheaded by wife.  Also had sharp chest pain pain without radiation.  EMS was called and found to be bradycardic at 29-45 bpm and hypotensive.  He was given atropine and 500 cc bolus of normal saline without significant improvement.  Had a run of V. tach in route, strip not available for  review.  On arrival to ER, his heart rate was in 38H systolic blood pressure in 70s.  He was given bicarb and 1 mg of epinephrine with good response.  Admitted for possible cardiogenic shock.  Bladder scan with 150cc.  Held ydroxychloroquine and amatryptiline and betablocker. Feeling better. HR stable in 60-70s.   Serum creatinine improved 3.28 >>2.54. Low albumin 2.7>>2.6 Worsening AST/ALT 157/130>>>>297/233 Lactic acid 5.2 Hemoglobin 10.2>>8.5 COVID negative CXR unremarkable BNP 3872 Hs-Troponin 600->569  Abdominal US: IMPRESSION: 1. Sludge with multiple tiny intermingled gallstones. No gallbladder wall thickening or pericholecystic fluid.  2. Diffuse increase in liver echogenicity, a finding indicative of hepatic steatosis. While no focal liver lesions are evident on this study, it must be cautioned that the sensitivity of ultrasound for detection of focal liver lesions is diminished significantly in this circumstance.  3. Pancreas and most of spleen obscured by gas. Much of the inferior vena cava obscured by gas. Portions of aorta obscured by gas.  Patient was seen by PCP 06/24/2019.  Limited note indicates atypical chest pain.  Intermittent agitation, memory loss and confusion.  No work-up.  ENT referral made for chronic hoarseness. Denies alcohol abuse or tobacco smoking.  Heart Pathway Score:     Past Medical History:  Diagnosis Date   Acute on chronic combined systolic and diastolic CHF (congestive heart failure) (HCC) 09/02/2016   Chronic back pain    Chronic neck pain    Coronary artery disease 1993   angioplasty after AMI   Diabetes mellitus    Diabetic neuropathy ([place])  Hyperkalemia    Hypertension    Loculated pleural effusion 09/09/2016   Pancytopenia ([place]) 08/13/2014   Prolonged QT interval 08/14/2014   Possibly secondary to methadone and amitriptyline.   Rheumatoid arthritis(714.0)    Seizure ([place]) 08/13/2014   Pt denies    Past Surgical  History:  Procedure Laterality Date   CARDIAC SURGERY     FRACTURE SURGERY     JOINT REPLACEMENT     LEG AMPUTATION  06/216   left leg   MUSCLE BIOPSY  06/2016    Inpatient Medications: Scheduled Meds:  aspirin EC  81 mg Oral Daily   docusate sodium  100 mg Oral TID   DULoxetine  30 mg Oral Daily   finasteride  5 mg Oral Daily   heparin injection (subcutaneous)  5,000 Units Subcutaneous Q8H   insulin aspart  0-9 Units Subcutaneous Q4H   mouth rinse  15 mL Mouth Rinse BID   methadone  10 mg Oral Q12H   polyethylene glycol  17 g Oral BID   predniSONE  5 mg Oral Q breakfast   pregabalin  25 mg Oral QHS   tamsulosin  0.4 mg Oral QHS   Continuous Infusions:  sodium chloride     PRN Meds: acetaminophen **OR** acetaminophen, clonazePAM, HYDROmorphone (DILAUDID) injection, polyvinyl alcohol  Allergies:    Allergies  Allergen Reactions   Sulfa Antibiotics Rash    Social History:   Social History   Socioeconomic History   Marital status: Married    Spouse name: Not on file   Number of children: Not on file   Years of education: Not on file   Highest education level: Not on file  Occupational History   Not on file  Social Needs   Financial resource strain: Not on file   Food insecurity    Worry: Not on file    Inability: Not on file   Transportation needs    Medical: Not on file    Non-medical: Not on file  Tobacco Use   Smoking status: Never Smoker   Smokeless tobacco: Never Used  Substance and Sexual Activity   Alcohol use: No   Drug use: No   Sexual activity: Not on file  Lifestyle   Physical activity    Days per week: Not on file    Minutes per session: Not on file   Stress: Not on file  Relationships   Social connections    Talks on phone: Not on file    Gets together: Not on file    Attends religious service: Not on file    Active member of club or organization: Not on file    Attends meetings of clubs or  organizations: Not on file    Relationship status: Not on file   Intimate partner violence    Fear of current or ex partner: Not on file    Emotionally abused: Not on file    Physically abused: Not on file    Forced sexual activity: Not on file  Other Topics Concern   Not on file  Social History Narrative   Not on file    Family History:   Family History  Problem Relation Age of Onset   Diabetes Father    Dementia Father    Heart attack Brother        multiple brothers with MIs   Hypertension Mother    Stroke Mother      ROS:  Please see the history of present illness.  All  other ROS reviewed and negative.     Physical Exam/Data:   Vitals:   07/02/19 0900 07/02/19 1000 07/02/19 1055 07/02/19 1100  BP: (!) 120/55 132/67  (!) 152/73  Pulse: 68 63 69 72  Resp: 19 16 (!) 31 (!) 35  Temp:   97.7 F (36.5 C)   TempSrc:   Oral   SpO2: 97% 94% 96% 100%  Weight:      Height:        Intake/Output Summary (Last 24 hours) at 07/02/2019 1301 Last data filed at 07/02/2019 0420 Gross per 24 hour  Intake --  Output 250 ml  Net -250 ml   Last 3 Weights 07/01/2019 01/08/2019 10/18/2018  Weight (lbs) 171 lb 1.2 oz (No Data) (No Data)  Weight (kg) 77.6 kg (No Data) (No Data)     Body mass index is 23.86 kg/m.  General:  Frail ill appearing male in no acute distress HEENT: Horseness Lymph: no adenopathy Neck: + JVD Endocrine:  No thryomegaly Vascular: No carotid bruits; FA pulses 2+ bilaterally without bruits  Cardiac:  normal S1, S2; RRR; 4-6 systolic murmur Lungs:  clear to auscultation bilaterally, no wheezing, rhonchi or rales  Abd: soft, nontender, no hepatomegaly  Ext/MSK  s/p L BKA Skin: warm and dry  Neuro:  CNs 2-12 intact, no focal abnormalities noted Psych:  Normal affect   EKG:  The EKG was personally reviewed and demonstrates:  SR at rate of 59 bpm (read as afib) , new LBBB,  Telemetry:  Telemetry was personally reviewed and demonstrates:  SR at rate of  60-70s with frequent PACs and PVCs  Relevant CV Studies:  [facility] 07/09/2015 Cardiac cath 1. Significant 3-vessel coronary artery disease. 1. Suggest LIMA-LAD, SVG-PDA, SVG-[place] 2. Normal LV filling pressures  [facility] 07/16/2015 Carotid Final Interpretation  Right Carotid: There is evidence in the ICA of a less than 40%  stenosis. Non-hemodynamically significant plaque  noted in the CCA. Plaque noted in the ECA.  Left Carotid: There is evidence in the ICA of a 40-59% stenosis.  Non-hemodynamically significant plaque noted in the  CCA. Plaque noted in the ECA.  Vertebrals: Both vertebral arteries were patent with antegrade flow.  Subclavians: Right subclavian artery flow was disturbed. Normal flow  hemodynamics were seen in the left subclavian artery.    08/2016 echo Study Conclusions  - Left ventricle: The cavity size was normal. Wall thickness was normal. Systolic function was moderately to severely reduced. The estimated ejection fraction was in the range of 30% to 35%. Features are consistent with a pseudonormal left ventricular filling pattern, with concomitant abnormal relaxation and increased filling pressure (grade 2 diastolic dysfunction). Doppler parameters are consistent with high ventricular filling pressure. - Regional wall motion abnormality: Severe hypokinesis of the mid-apical anterior and mid anteroseptal myocardium. - Aortic valve: Moderately to severely calcified annulus. Mildly thickened, moderately calcified leaflets. There was mild to moderate stenosis. Peak velocity (S): 233 cm/s. Mean gradient (S): 12 mm Hg. Valve area (VTI): 1.23 cm^2. Valve area (Vmax): 1.31 cm^2. Valve area (Vmean): 1.19 cm^2. - Mitral valve: Mildly calcified annulus. Mildly calcified leaflets . There was moderate regurgitation. - Left atrium: The atrium was mildly dilated. - Right ventricle: Systolic function was moderately reduced. - Tricuspid valve: There was  mild regurgitation.  Laboratory Data:  High Sensitivity Troponin:   Recent Labs  Lab 07/01/19 1638 07/01/19 1838  TROPONINIHS 600* 567*      Chemistry Recent Labs  Lab 07/01/19 1638 07/01/19 1906 07/02/19 0359  NA 137 136 137  K 4.2 4.4 4.3  CL 96*  --  102  CO2 23  --  27  GLUCOSE 261*  --  137*  BUN 33*  --  33*  CREATININE 3.28*  --  2.54*  CALCIUM 8.4*  --  8.4*  GFRNONAA 18*  --  25*  GFRAA 21*  --  29*  ANIONGAP 18*  --  8    Recent Labs  Lab 07/01/19 1855 07/02/19 0359  PROT 5.6* 5.3*  ALBUMIN 2.7* 2.6*  AST 157* 297*  ALT 130* 233*  ALKPHOS 68 65  BILITOT 1.4* 1.0   Hematology Recent Labs  Lab 07/01/19 1638 07/01/19 1906 07/02/19 0359  WBC 12.6*  --  7.7  RBC 3.33*  --  2.73*  HGB 10.2* 10.2* 8.5*  HCT 32.3* 30.0* 26.1*  MCV 97.0  --  95.6  MCH 30.6  --  31.1  MCHC 31.6  --  32.6  RDW 13.6  --  13.6  PLT 181  --  106*   BNP Recent Labs  Lab 07/01/19 1639  BNP 3,872.1*    Radiology/Studies:  Dg Abdomen 1 View  Result Date: 07/01/2019 CLINICAL DATA:  Lethargic EXAM: ABDOMEN - 1 VIEW COMPARISON:  None. FINDINGS: Scattered large and small bowel gas is noted. No abnormal mass or abnormal calcifications are seen. No obstructive changes are noted. No free air is seen. Left hip replacement is noted. IMPRESSION: No acute abnormality seen. Electronically Signed   By: Inez Catalina M.D.   On: 07/01/2019 18:34   US Abdomen Complete  Result Date: 07/01/2019 CLINICAL DATA:  Elevated liver enzymes EXAM: ABDOMEN ULTRASOUND COMPLETE COMPARISON:  None. FINDINGS: Gallbladder: There is sludge with multiple tiny intermingled gallstones, likely measuring 1-2 mm in size. There is no appreciable gallbladder wall thickening or pericholecystic fluid. No sonographic Murphy sign noted by sonographer. Common bile duct: Diameter: 3 mm. No intrahepatic, common hepatic, or common bile duct dilatation. Liver: No focal lesion identified. Liver echogenicity is increased  diffusely. Portal vein is patent on color Doppler imaging with normal direction of blood flow towards the liver. IVC: No abnormality visualized in regions which can be visualized. Much of the inferior vena cava is obscured by gas. Pancreas: Essentially completely obscured by gas. Spleen: Limited visualization due to gas. Right Kidney: Length: 9.8 cm. Echogenicity within normal limits. No mass or hydronephrosis visualized. Left Kidney: Length: 11.3 cm. Echogenicity within normal limits. No mass or hydronephrosis visualized. Abdominal aorta: No aneurysm visualized. Portions of aorta obscured by gas. Other findings: No demonstrable ascites. IMPRESSION: 1. Sludge with multiple tiny intermingled gallstones. No gallbladder wall thickening or pericholecystic fluid. 2. Diffuse increase in liver echogenicity, a finding indicative of hepatic steatosis. While no focal liver lesions are evident on this study, it must be cautioned that the sensitivity of ultrasound for detection of focal liver lesions is diminished significantly in this circumstance. 3. Pancreas and most of spleen obscured by gas. Much of the inferior vena cava obscured by gas. Portions of aorta obscured by gas. Electronically Signed   By: Lowella Grip III M.D.   On: 07/01/2019 20:41   Dg Chest Portable 1 View  Result Date: 07/01/2019 CLINICAL DATA:  Lethargic with chest pain EXAM: PORTABLE CHEST 1 VIEW COMPARISON:  01/21/2019 FINDINGS: Postsurgical changes are again noted. Cardiac shadow is mildly enlarged but accentuated by the frontal technique. Right jugular central line is noted in satisfactory position. No pneumothorax is seen. Mild right basilar scarring is again  noted. No focal infiltrate is seen. No bony abnormality is noted. IMPRESSION: Right jugular central line without pneumothorax. Right basilar scarring stable from the prior exam. Electronically Signed   By: Inez Catalina M.D.   On: 07/01/2019 18:27    Assessment and Plan:   1. Chronic  combined CHF - BNP 3872. CXR without vascular congestion. Last echo in 2017 with LVEF of 30-35% and grade II DD.  - Likely recent episode of dyspnea due to unable to void since removal of foley - Held BB 2nd to bradycardia. No ACE/ARB due to CKD.   2. Chest pain / elevated troponin with CAD s/p remote stenting and CABG in 2016 - Intermittent chest pain recently. Unable to tell if similar to prior angina or not. Troponin mild trend 600-567. Consistent with demand ischemia. He has new LBBB on EKG.  - Given CP and new LBBB he will need ischemic evaluation at some point. Get echo first. If needs invasive evaluation, needs to consult nephology given renal function.  - Continue ASA . - Held BB for bradycardia and statin due to elevated transaminitis.   3. Bradycardia - given atropine and epi yesterday. Held BB. - HR now improved to 60-70s - No High grade bradyarrhythmias  4. Aortic stenosis - Mild to moderate AS by last echo in 2017. Suspect worsening with ? Low output leading to hepatic congestion.  - Will get echo this admission for further evaluation   5. PVD s/p L BKA - Wheelchair bound   6. Transaminitis / vomiting / constipation  - AST/ALT trending up - Abdominal US as above - Work up per primary team   7. Acute on CKD IIII - Recent removal of chronic foley - 150cc by bladder scan - Serum creatinine improved 3.28 >>2.54. - Followed by nephrologist   8. DM - Per primary team   9. Recent transient neurologic deficit per PCP note - Work up per primary team  10. Hx of post op afib - No afib noted this admission  Will follow.  For questions or updates, please contact [place] Please consult [email or web address] for contact info under     Signed, Leanor Kail, PA  07/02/2019 1:01 PM   Agree with note by Robbie Lis PA-C  We are asked to see Mr. Deery because of hypotension, bradycardia and questionable A. fib.  He sees Dr. Harl [name] in the past but he has not seen him  for several years.  He has had CABG at [facility], diabetes status post left BKA, chronic renal insufficiency and severe LV dysfunction with moderate to severe aortic stenosis.  He has a prosthesis at home and walks with a walker.  He does not smoke.  He was admitted with altered mental status, hypotension and elevated serum lactic acid level which has since normalized.  His serum creatinine was in the mid 3 range and this is starting to improve as well with minimal fluid bolus.  He has atypical chest pain which I do not think is anginal and denies shortness of breath.  He really ambulates minimally and mostly is in a wheelchair.  On exam his lungs are clear.  He has a loud outflow tract murmur consistent with aortic stenosis.  There is no JVD.  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  It is unclear the etiology of his hypotension, bradycardia.  This may be all related to dehydration since he responded to fluid resuscitation.  His troponins are low and flat.[name]  His EKG shows sinus rhythm with left bundle branch block which is new, occasional PVCs and PACs.  At this point, we will check a 2D echocardiogram.  He is not a candidate to have any invasive procedure given his renal insufficiency and other comorbidities.  We can certainly check a pharmacologic Myoview stress test as an outpatient.  Lorretta Harp, M.D., [place], [facility], [place] [address] [place], [place]  [id number]  [phone] 07/02/2019 2:33 PM

## 2019-07-02 NOTE — Progress Notes (Addendum)
PROGRESS NOTE    [name]  VFI:[id number] DOB: 06-05-1951 DOA: 07/01/2019 PCP: [name], MD   Brief Narrative:  Patient is a 68 year old male with history of CKD stage III, diabetes type 2 on insulin, coronary artery disease status post PCI, chronic combined CHF, rheumatoid arthritis, chronic pain syndrome on methadone, hypertension who presented with complaints of vomiting, abdominal pain, chest pain, generalized weakness, lightheadedness.  He was found to be lethargic by his wife and EMS was called.  Patient was bradycardic and hypotensive on presentation with heart rate ranging in 30s.  Also had a run of V. tach.  Given atropine without much improvement.  When he arrived in the emergency department, his blood pressure was in the range of 70s/30s.  EKG shows new left bundle branch block.  Patient was also found to have severe acute kidney injury, elevated lactate level, elevated liver enzymes, elevated troponin.  Cardiology consulted today.  Currently he is hemodynamically stable.  Assessment & Plan:   Principal Problem:   Cardiogenic shock ([place]) Active Problems:   Rheumatoid arthritis ([place])   Hypertension   Bradycardia   Acute renal failure superimposed on stage 3 chronic kidney disease (HCC)   Opioid dependence on maintenance agonist therapy, no symptoms (HCC)   Elevated troponin   Hypotension   LBBB (left bundle branch block)   Current chronic use of systemic steroids  Hypotension/bradycardia:Currently hemodynamically stable.  Blood pressure and heart rate has improved.  Continue IV fluids.  Chest pain/elevated troponin/left bundle branch block: Cardiology following.  No plan for any invasive procedure for now. Elevated troponin most likely due to supply demand ischemia.  Pending echocardiogram.  Denies any chest pain this morning.  Planning for outpatient stress test.  Acute kidney injury on CKD stage III: Most likely prerenal precipitated by hypotension/bradycardia.   Kidney function improving with IV fluids.  Ultrasound of the abdomen showed normal echogenicity of the kidneys.  Elevated lactic acid level: Most likely precipitated by hypotension, bradycardia.  Lactate level improving.  Continue IV fluids  History of combined CHF/aortic stenosis: Looks dehydrated on presentation.  Started on IV fluids.  Will be cautious with IV fluids.  Follow-up echocardiogram.  History of coronary artery disease: Status post stents and CABG.  Hypertension: Currently blood pressure stable.  Continue to monitor.  Hyperlipidemia: Continue Lipitor  Diabetes mellitus type 2: On insulin at home.  Continue current insulin regimen.  Elevated liver enzymes: Likely secondary to hepatic steatosis.  No history of alcohol abuse.  Continue to monitor.  Rheumatoid arthritis: Has deformities on the hands.  Continue prednisone. On prednisone hydroxychloroquine at home. Follows with rheumatology as an outpatient  Chronic pain syndrome: On methadone at home.  Debility/deconditioning: Status post left BKA.  Use prosthesis, walker, wheelchair for ambulation.  Requested physical therapy  for generalized weakness           DVT prophylaxis: Heparin [place] Code Status: Full Family Communication: Discussed with wife on phone Disposition Plan: Likely home in 1 to 2 days   Consultants: Cardiology  Procedures: None  Antimicrobials:  Anti-infectives (From admission, onward)   Start     Dose/Rate Route Frequency Ordered Stop   07/02/19 1000  hydroxychloroquine (PLAQUENIL) tablet 200 mg  Status:  Discontinued     200 mg Oral Daily 07/01/19 1936 07/01/19 1946      Subjective: Patient seen and examined at bedside this morning.  Hemodynamically stable during my evaluation.  Blood pressure stable.  Heart rate well maintained.  Denies any  chest pain.  Feels much better today.  No abdominal pain, nausea or vomiting.  Objective: Vitals:   07/02/19 0900 07/02/19 1000 07/02/19 1055  07/02/19 1100  BP: (!) 120/55 132/67  (!) 152/73  Pulse: 68 63 69 72  Resp: 19 16 (!) 31 (!) 35  Temp:   97.7 F (36.5 C)   TempSrc:   Oral   SpO2: 97% 94% 96% 100%  Weight:      Height:        Intake/Output Summary (Last 24 hours) at 07/02/2019 1538 Last data filed at 07/02/2019 0420 Gross per 24 hour  Intake --  Output 250 ml  Net -250 ml   Filed Weights   07/01/19 2138  Weight: 77.6 kg    Examination:  General exam: Not in distress,debilitated HEENT:PERRL,Oral mucosa moist, Ear/Nose normal on gross exam,haorseness of voice Respiratory system: Crackles on the right base Cardiovascular system: S1 & S2 heard, RRR. Murmur present. No pedal edema. Gastrointestinal system: Abdomen is nondistended, soft and nontender. No organomegaly or masses felt. Normal bowel sounds heard. Central nervous system: Alert and oriented. No focal neurological deficits. Extremities: No edema, no clubbing ,no cyanosis .Left BKA Deformities of the hands from rheumatoid arthritis Skin: No rashes, lesions or ulcers,no icterus ,no pallor  Data Reviewed: I have personally reviewed following labs and imaging studies  CBC: Recent Labs  Lab 07/01/19 1638 07/01/19 1906 07/02/19 0359  WBC 12.6*  --  7.7  HGB 10.2* 10.2* 8.5*  HCT 32.3* 30.0* 26.1*  MCV 97.0  --  95.6  PLT 181  --  287*   Basic Metabolic Panel: Recent Labs  Lab 07/01/19 1638 07/01/19 1906 07/02/19 0359  NA 137 136 137  K 4.2 4.4 4.3  CL 96*  --  102  CO2 23  --  27  GLUCOSE 261*  --  137*  BUN 33*  --  33*  CREATININE 3.28*  --  2.54*  CALCIUM 8.4*  --  8.4*   GFR: Estimated Creatinine Clearance: 29.2 mL/min (A) (by C-G formula based on SCr of 2.54 mg/dL (H)). Liver Function Tests: Recent Labs  Lab 07/01/19 1855 07/02/19 0359  AST 157* 297*  ALT 130* 233*  ALKPHOS 68 65  BILITOT 1.4* 1.0  PROT 5.6* 5.3*  ALBUMIN 2.7* 2.6*   Recent Labs  Lab 07/01/19 1855  LIPASE 18   No results for input(s): AMMONIA in the  last 168 hours. Coagulation Profile: No results for input(s): INR, PROTIME in the last 168 hours. Cardiac Enzymes: No results for input(s): CKTOTAL, CKMB, CKMBINDEX, TROPONINI in the last 168 hours. BNP (last 3 results) No results for input(s): PROBNP in the last 8760 hours. HbA1C: No results for input(s): HGBA1C in the last 72 hours. CBG: Recent Labs  Lab 07/01/19 1948 07/01/19 2335 07/02/19 0402 07/02/19 0810 07/02/19 1105  GLUCAP 201* 187* 141* 125* 158*   Lipid Profile: No results for input(s): CHOL, HDL, LDLCALC, TRIG, CHOLHDL, LDLDIRECT in the last 72 hours. Thyroid Function Tests: No results for input(s): TSH, T4TOTAL, FREET4, T3FREE, THYROIDAB in the last 72 hours. Anemia Panel: No results for input(s): VITAMINB12, FOLATE, FERRITIN, TIBC, IRON, RETICCTPCT in the last 72 hours. Sepsis Labs: Recent Labs  Lab 07/01/19 1855 07/02/19 1241  LATICACIDVEN 5.2* 1.4    Recent Results (from the past 240 hour(s))  SARS Coronavirus 2 (CEPHEID - Performed in [facility] hospital lab), Hosp Order     Status: None   Collection Time: 07/01/19  5:44 PM  Specimen: Nasopharyngeal Swab  Result Value Ref Range Status   SARS Coronavirus 2 NEGATIVE NEGATIVE Final    Comment: (NOTE) If result is NEGATIVE SARS-CoV-2 target nucleic acids are NOT DETECTED. The SARS-CoV-2 RNA is generally detectable in upper and lower  respiratory specimens during the acute phase of infection. The lowest  concentration of SARS-CoV-2 viral copies this assay can detect is 250  copies / mL. A negative result does not preclude SARS-CoV-2 infection  and should not be used as the sole basis for treatment or other  patient management decisions.  A negative result may occur with  improper specimen collection / handling, submission of specimen other  than nasopharyngeal swab, presence of viral mutation(s) within the  areas targeted by this assay, and inadequate number of viral copies  (<250 copies / mL). A  negative result must be combined with clinical  observations, patient history, and epidemiological information. If result is POSITIVE SARS-CoV-2 target nucleic acids are DETECTED. The SARS-CoV-2 RNA is generally detectable in upper and lower  respiratory specimens dur ing the acute phase of infection.  Positive  results are indicative of active infection with SARS-CoV-2.  Clinical  correlation with patient history and other diagnostic information is  necessary to determine patient infection status.  Positive results do  not rule out bacterial infection or co-infection with other viruses. If result is PRESUMPTIVE POSTIVE SARS-CoV-2 nucleic acids MAY BE PRESENT.   A presumptive positive result was obtained on the submitted specimen  and confirmed on repeat testing.  While 2019 novel coronavirus  (SARS-CoV-2) nucleic acids may be present in the submitted sample  additional confirmatory testing may be necessary for epidemiological  and / or clinical management purposes  to differentiate between  SARS-CoV-2 and other Sarbecovirus currently known to infect humans.  If clinically indicated additional testing with an alternate test  methodology [phone]) is advised. The SARS-CoV-2 RNA is generally  detectable in upper and lower respiratory sp ecimens during the acute  phase of infection. The expected result is Negative. Fact Sheet for Patients:  StrictlyIdeas.no Fact Sheet for Healthcare Providers: BankingDealers.co.za This test is not yet approved or cleared by the [place] FDA and has been authorized for detection and/or diagnosis of SARS-CoV-2 by FDA under an Emergency Use Authorization (EUA).  This EUA will remain in effect (meaning this test can be used) for the duration of the COVID-19 declaration under Section 564(b)(1) of the Act, 21 U.S.C. section 360bbb-3(b)(1), unless the authorization is terminated or revoked sooner. Performed  at [facility], [place] [address] [place], [place] [address]   Blood culture (routine x 2)     Status: None (Preliminary result)   Collection Time: 07/01/19  6:50 PM   Specimen: BLOOD  Result Value Ref Range Status   Specimen Description BLOOD LEFT ANTECUBITAL  Final   Special Requests   Final    BOTTLES DRAWN AEROBIC AND ANAEROBIC Blood Culture adequate volume   Culture   Final    NO GROWTH < 24 HOURS Performed at [facility], [place] [address] [place], [place] [address]    Report Status PENDING  Incomplete  Blood culture (routine x 2)     Status: None (Preliminary result)   Collection Time: 07/01/19  6:55 PM   Specimen: BLOOD LEFT FOREARM  Result Value Ref Range Status   Specimen Description BLOOD LEFT FOREARM  Final   Special Requests   Final    BOTTLES DRAWN AEROBIC ONLY Blood Culture adequate volume  Culture   Final    NO GROWTH < 24 HOURS Performed at [facility], [place] [address] [place], [place] [address]    Report Status PENDING  Incomplete  MRSA PCR Screening     Status: None   Collection Time: 07/01/19  9:57 PM   Specimen: Nasal Mucosa; Nasopharyngeal  Result Value Ref Range Status   MRSA by PCR NEGATIVE NEGATIVE Final    Comment:        The GeneXpert MRSA Assay (FDA approved for NASAL specimens only), is one component of a comprehensive MRSA colonization surveillance program. It is not intended to diagnose MRSA infection nor to guide or monitor treatment for MRSA infections. Performed at [facility], [place] [address], [place], [place] [address]          Radiology Studies: Dg Abdomen 1 View  Result Date: 07/01/2019 CLINICAL DATA:  Lethargic EXAM: ABDOMEN - 1 VIEW COMPARISON:  None. FINDINGS: Scattered large and small bowel gas is noted. No abnormal mass or abnormal calcifications are seen. No obstructive changes are noted. No free air is seen. Left hip replacement is noted. IMPRESSION: No acute abnormality seen. Electronically  Signed   By: Inez Catalina M.D.   On: 07/01/2019 18:34   US Abdomen Complete  Result Date: 07/01/2019 CLINICAL DATA:  Elevated liver enzymes EXAM: ABDOMEN ULTRASOUND COMPLETE COMPARISON:  None. FINDINGS: Gallbladder: There is sludge with multiple tiny intermingled gallstones, likely measuring 1-2 mm in size. There is no appreciable gallbladder wall thickening or pericholecystic fluid. No sonographic Murphy sign noted by sonographer. Common bile duct: Diameter: 3 mm. No intrahepatic, common hepatic, or common bile duct dilatation. Liver: No focal lesion identified. Liver echogenicity is increased diffusely. Portal vein is patent on color Doppler imaging with normal direction of blood flow towards the liver. IVC: No abnormality visualized in regions which can be visualized. Much of the inferior vena cava is obscured by gas. Pancreas: Essentially completely obscured by gas. Spleen: Limited visualization due to gas. Right Kidney: Length: 9.8 cm. Echogenicity within normal limits. No mass or hydronephrosis visualized. Left Kidney: Length: 11.3 cm. Echogenicity within normal limits. No mass or hydronephrosis visualized. Abdominal aorta: No aneurysm visualized. Portions of aorta obscured by gas. Other findings: No demonstrable ascites. IMPRESSION: 1. Sludge with multiple tiny intermingled gallstones. No gallbladder wall thickening or pericholecystic fluid. 2. Diffuse increase in liver echogenicity, a finding indicative of hepatic steatosis. While no focal liver lesions are evident on this study, it must be cautioned that the sensitivity of ultrasound for detection of focal liver lesions is diminished significantly in this circumstance. 3. Pancreas and most of spleen obscured by gas. Much of the inferior vena cava obscured by gas. Portions of aorta obscured by gas. Electronically Signed   By: Lowella Grip III M.D.   On: 07/01/2019 20:41   Dg Chest Portable 1 View  Result Date: 07/01/2019 CLINICAL DATA:  Lethargic  with chest pain EXAM: PORTABLE CHEST 1 VIEW COMPARISON:  01/21/2019 FINDINGS: Postsurgical changes are again noted. Cardiac shadow is mildly enlarged but accentuated by the frontal technique. Right jugular central line is noted in satisfactory position. No pneumothorax is seen. Mild right basilar scarring is again noted. No focal infiltrate is seen. No bony abnormality is noted. IMPRESSION: Right jugular central line without pneumothorax. Right basilar scarring stable from the prior exam. Electronically Signed   By: Inez Catalina M.D.   On: 07/01/2019 18:27        Scheduled Meds:  aspirin EC  81 mg Oral Daily   docusate sodium  100 mg Oral TID   DULoxetine  30 mg Oral Daily   finasteride  5 mg Oral Daily   heparin injection (subcutaneous)  5,000 Units Subcutaneous Q8H   insulin aspart  0-9 Units Subcutaneous Q4H   mouth rinse  15 mL Mouth Rinse BID   methadone  10 mg Oral Q12H   polyethylene glycol  17 g Oral BID   predniSONE  5 mg Oral Q breakfast   pregabalin  25 mg Oral QHS   tamsulosin  0.4 mg Oral QHS   Continuous Infusions:  sodium chloride       LOS: 1 day    Time spent: 35 mins.More than 50% of that time was spent in counseling and/or coordination of care.      Shelly Coss, MD [facility] Pager [phone]  If 7PM-7AM, please contact night-coverage [email or web address] Password [facility] 07/02/2019, 3:38 PM

## 2019-07-03 ENCOUNTER — Other Ambulatory Visit: Payer: Self-pay

## 2019-07-03 ENCOUNTER — Other Ambulatory Visit: Payer: Self-pay | Admitting: Physician Assistant

## 2019-07-03 DIAGNOSIS — R079 Chest pain, unspecified: Secondary | ICD-10-CM

## 2019-07-03 DIAGNOSIS — I5043 Acute on chronic combined systolic (congestive) and diastolic (congestive) heart failure: Secondary | ICD-10-CM

## 2019-07-03 DIAGNOSIS — Z951 Presence of aortocoronary bypass graft: Secondary | ICD-10-CM

## 2019-07-03 DIAGNOSIS — I959 Hypotension, unspecified: Secondary | ICD-10-CM

## 2019-07-03 LAB — COMPREHENSIVE METABOLIC PANEL
ALT: 327 U/L — ABNORMAL HIGH (ref 0–44)
AST: 268 U/L — ABNORMAL HIGH (ref 15–41)
Albumin: 2.4 g/dL — ABNORMAL LOW (ref 3.5–5.0)
Alkaline Phosphatase: 54 U/L (ref 38–126)
Anion gap: 7 (ref 5–15)
BUN: 27 mg/dL — ABNORMAL HIGH (ref 8–23)
CO2: 27 mmol/L (ref 22–32)
Calcium: 8.2 mg/dL — ABNORMAL LOW (ref 8.9–10.3)
Chloride: 103 mmol/L (ref 98–111)
Creatinine, Ser: 1.65 mg/dL — ABNORMAL HIGH (ref 0.61–1.24)
GFR calc Af Amer: 48 mL/min — ABNORMAL LOW (ref >60)
GFR calc non Af Amer: 42 mL/min — ABNORMAL LOW (ref >60)
Glucose, Bld: 114 mg/dL — ABNORMAL HIGH (ref 70–99)
Potassium: 4 mmol/L (ref 3.5–5.1)
Sodium: 137 mmol/L (ref 135–145)
Total Bilirubin: 0.9 mg/dL (ref 0.3–1.2)
Total Protein: 4.8 g/dL — ABNORMAL LOW (ref 6.5–8.1)

## 2019-07-03 LAB — GLUCOSE, CAPILLARY
Glucose-Capillary: 108 mg/dL — ABNORMAL HIGH (ref 70–99)
Glucose-Capillary: 118 mg/dL — ABNORMAL HIGH (ref 70–99)
Glucose-Capillary: 154 mg/dL — ABNORMAL HIGH (ref 70–99)
Glucose-Capillary: 187 mg/dL — ABNORMAL HIGH (ref 70–99)

## 2019-07-03 LAB — CBC WITH DIFFERENTIAL/PLATELET
Abs Immature Granulocytes: 0.02 10*3/uL (ref 0.00–0.07)
Basophils Absolute: 0 10*3/uL (ref 0.0–0.1)
Basophils Relative: 0 %
Eosinophils Absolute: 0.3 10*3/uL (ref 0.0–0.5)
Eosinophils Relative: 6 %
HCT: 25.3 % — ABNORMAL LOW (ref 39.0–52.0)
Hemoglobin: 8 g/dL — ABNORMAL LOW (ref 13.0–17.0)
Immature Granulocytes: 0 %
Lymphocytes Relative: 27 %
Lymphs Abs: 1.3 10*3/uL (ref 0.7–4.0)
MCH: 30.1 pg (ref 26.0–34.0)
MCHC: 31.6 g/dL (ref 30.0–36.0)
MCV: 95.1 fL (ref 80.0–100.0)
Monocytes Absolute: 0.3 10*3/uL (ref 0.1–1.0)
Monocytes Relative: 6 %
Neutro Abs: 2.8 10*3/uL (ref 1.7–7.7)
Neutrophils Relative %: 61 %
Platelets: 91 10*3/uL — ABNORMAL LOW (ref 150–400)
RBC: 2.66 MIL/uL — ABNORMAL LOW (ref 4.22–5.81)
RDW: 13.5 % (ref 11.5–15.5)
WBC: 4.6 10*3/uL (ref 4.0–10.5)
nRBC: 0 % (ref 0.0–0.2)

## 2019-07-03 LAB — URINALYSIS, COMPLETE (UACMP) WITH MICROSCOPIC
Bilirubin Urine: NEGATIVE
Glucose, UA: NEGATIVE mg/dL
Ketones, ur: NEGATIVE mg/dL
Nitrite: NEGATIVE
Protein, ur: NEGATIVE mg/dL
Specific Gravity, Urine: 1.01 (ref 1.005–1.030)
pH: 7 (ref 5.0–8.0)

## 2019-07-03 LAB — HIV ANTIBODY (ROUTINE TESTING W REFLEX): HIV Screen 4th Generation wRfx: NONREACTIVE

## 2019-07-03 LAB — CREATININE, URINE, RANDOM: Creatinine, Urine: 59.79 mg/dL

## 2019-07-03 LAB — SODIUM, URINE, RANDOM: Sodium, Ur: 66 mmol/L

## 2019-07-03 NOTE — Plan of Care (Signed)

## 2019-07-03 NOTE — Discharge Summary (Signed)
Physician Discharge Summary  [name] IRW:[id number] DOB: 10/10/1951 DOA: 07/01/2019  PCP: [name] Kirschner, MD  Admit date: 07/01/2019 Discharge date: 07/03/2019  Time spent: 30 minutes  Recommendations for Outpatient Follow-up:   Coronary artery disease - history of CAD status post stenting remotely, CABG at [facility] 7/16 with atypical chest pain.  Admitted with question cardiogenic shock.  His enzymes are low and flat.  Doubt ACS.  Given his atypical chest pain he would benefit from having outpatient pharmacologic Myoview stress test.  2: Diastolic heart failure - BNP elevated at 3000.  His lungs are clear.  He is on torsemide at home.  He denies shortness of breath.  I suspect this is related both to diastolic heart failure and aortic stenosis. - Cardiology recommendations Hold beta-blocker, continue torsemide -Cardiology has arranged follow-up 7/15  @0  830 outpatient pharmacologic Myoview stress test -Cardiology has arranged follow-up for 8/5 @1300  with Dr. [name] PA.   3: Cardiogenic shock - patient was admitted with altered mental status, hypotension and bradycardia.  He responded to atropine, epinephrine and fluid resuscitation.  His vital signs have improved.  I am not sure the etiology of his admitting presentation.  It could potentially have been related to dehydration.  He was on a beta-blocker prior to admission which can be held.  4: Hyperlipidemia -on high-dose statin therapy  5: Severe aortic stenosis - 2D echo yesterday revealed normal LV systolic function with severe aortic stenosis.  The patient however is asymptomatic from this and would be a poor candidate for intervention.  6: Acute on chronic renal insufficiency - serum creatinine has continued to fall from the mid 3 range down to 1.65.     Discharge Diagnoses:  Principal Problem:   Cardiogenic shock ([place]) Active Problems:   Rheumatoid arthritis ([place])   Hypertension   Bradycardia   Acute  renal failure superimposed on stage 3 chronic kidney disease (HCC)   Opioid dependence on maintenance agonist therapy, no symptoms (HCC)   Elevated troponin   Hypotension   LBBB (left bundle branch block)   Current chronic use of systemic steroids   Discharge Condition: Stable  Diet recommendation: Heart healthy  Filed Weights   07/01/19 2138 07/03/19 0500  Weight: 77.6 kg 77.9 kg    History of present illness:  68 y.o.WM PMHx seizure, CKD stage 3, 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  Patient states he has not been able to keep anything down. He denies having any diarrhea but has not had a bowel movement a couple days. Patient began feeling very weak and lightheaded.Found lethargic by wife this afternoon apparently.EMS was called. They noted that the patient was bradycardic and hypotensive. HR 29-45. Had run of V tach. Atropine without much improvement.    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  Patient states he has not been able to keep anything down. He denies having any diarrhea but has not had a bowel movement a couple days. Patient began feeling very weak and lightheaded.Found lethargic by wife  this afternoon apparently.EMS was called. They noted that the patient was bradycardic and hypotensive. HR 29-45. Had run of V tach. Atropine without much improvement Cardiology was consulted made multiple changes to patient's medications with improvement in  patient's status.  Follow-up has been arranged with cardiology CHF clinic.  For further work-up.    Consultations: Cardiology    Antibiotics Anti-infectives (From admission, onward)   Start     Ordered Stop   07/02/19 1000  hydroxychloroquine (PLAQUENIL) tablet 200 mg  Status:  Discontinued     07/01/19 1936 07/01/19 1946       Discharge Exam: Vitals:   07/03/19 0300 07/03/19 0500 07/03/19 0804 07/03/19 1045  BP: 133/86  135/65 114/80  Pulse: 72     Resp: 17     Temp: 98.2 F (36.8 C)  97.7 F (36.5 C) 97.6 F (36.4 C)  TempSrc: Oral  Oral Oral  SpO2: 95%     Weight:  77.9 kg    Height:        General: A/O x4, no acute respiratory distress Eyes: negative scleral hemorrhage, negative anisocoria, negative icterus ENT: Negative Runny nose, negative gingival bleeding, Neck:  Negative scars, masses, torticollis, lymphadenopathy, JVD Lungs: Clear to auscultation bilaterally without wheezes or crackles Cardiovascular: Regular rate and rhythm, grade 3/6 holosystolic murmur, negative gallop or rub normal S1 and S2 Abdomen: negative abdominal pain, nondistended, positive soft, bowel sounds, no rebound, no ascites, no appreciable mass Extremities: No significant cyanosis, clubbing, or edema RLE.  LEFT BKA    Discharge Instructions   Allergies as of 07/03/2019      Reactions   Sulfa Antibiotics Rash      Medication List    STOP taking these medications   metoprolol succinate 25 MG 24 hr tablet Commonly known as: TOPROL-XL     TAKE these medications   amitriptyline 50 MG tablet Commonly known as: ELAVIL TAKE 1 AND 1/2 TABLETS BY MOUTH AT BEDTIME. What changed:   how much to take  how to take this  when to take this  additional instructions   aspirin EC 81 MG tablet Take 81 mg by mouth daily.   atorvastatin 80 MG tablet Commonly known as: LIPITOR TAKE ONE TABLET BY MOUTH ONCE DAILY.   clonazePAM 0.25 MG disintegrating tablet Commonly known as:  KLONOPIN One po qd prn anxiety What changed:   how much to take  how to take this  when to take this  reasons to take this  additional instructions   docusate sodium 100 MG capsule Commonly known as: COLACE Take 100 mg by mouth 3 (three) times daily.   DULoxetine 30 MG capsule Commonly known as: CYMBALTA Take 30 mg by mouth daily.   finasteride 5 MG tablet Commonly known as: PROSCAR Take 5 mg by mouth daily.   hydroxychloroquine 200 MG tablet Commonly known as: PLAQUENIL Take 200 mg by mouth daily.   insulin aspart protamine- aspart (70-30) 100 UNIT/ML injection Commonly known as: NOVOLOG MIX 70/30 Inject 12 units into skin in the morning  and 8 units evening What changed:   how much to take  how to take this  when to take this  additional instructions   methadone 10 MG tablet Commonly known as: DOLOPHINE Take 10 mg by mouth every 6 (six) hours.   methadone 5 MG tablet Commonly known as: DOLOPHINE Take 5 mg by mouth 3 (three) times daily as needed (breakthrough pain).   polyethylene glycol powder 17 GM/SCOOP powder Commonly known as:  MiraLax Take 17 g by mouth 2 (two) times daily.   predniSONE 5 MG tablet Commonly known as: DELTASONE TAKE 1 TABLET BY MOUTH ONCE A DAY WITH BREAKFAST. What changed: See the new instructions.   pregabalin 25 MG capsule Commonly known as: LYRICA TAKE ONE CAPSULE BY MOUTH AT BEDTIME.   REFRESH OP Place 1 drop into both eyes daily as needed (dry eyes).   tamsulosin 0.4 MG Caps capsule Commonly known as: FLOMAX Take 1 capsule (0.4 mg total) by mouth at bedtime.   torsemide 10 MG tablet Commonly known as: DEMADEX TAKE 2 TABLETS BY MOUTH EVERY OTHER DAY. What changed:   how much to take  how to take this  when to take this  additional instructions   Vitamin D3 25 MCG (1000 UT) Chew Chew 1 tablet by mouth daily.      Allergies  Allergen Reactions  . Sulfa Antibiotics Rash   Follow-up Information     [facility] Follow up on 07/10/2019.   Specialty: Cardiology Why: For stress test @ 8:30 am  come to [facility] for stress test (NPO after midnight)  Contact information: [place] [address] [address], [place], PA-C Follow up on 07/31/2019.   Specialties: Physician Assistant, Cardiology Why: @1pm  for hospital and stress test follow up with Dr. Nelly Laurence PA Contact information: [address] [place] [address] [phone]            The results of significant diagnostics from this hospitalization (including imaging, microbiology, ancillary and laboratory) are listed below for reference.    Significant Diagnostic Studies: Dg Abdomen 1 View  Result Date: 07/01/2019 CLINICAL DATA:  Lethargic EXAM: ABDOMEN - 1 VIEW COMPARISON:  None. FINDINGS: Scattered large and small bowel gas is noted. No abnormal mass or abnormal calcifications are seen. No obstructive changes are noted. No free air is seen. Left hip replacement is noted. IMPRESSION: No acute abnormality seen. Electronically Signed   By: Inez Catalina M.D.   On: 07/01/2019 18:34   US Abdomen Complete  Result Date: 07/01/2019 CLINICAL DATA:  Elevated liver enzymes EXAM: ABDOMEN ULTRASOUND COMPLETE COMPARISON:  None. FINDINGS: Gallbladder: There is sludge with multiple tiny intermingled gallstones, likely measuring 1-2 mm in size. There is no appreciable gallbladder wall thickening or pericholecystic fluid. No sonographic Murphy sign noted by sonographer. Common bile duct: Diameter: 3 mm. No intrahepatic, common hepatic, or common bile duct dilatation. Liver: No focal lesion identified. Liver echogenicity is increased diffusely. Portal vein is patent on color Doppler imaging with normal direction of blood flow towards the liver. IVC: No abnormality visualized in regions which can be visualized. Much of the inferior vena cava is obscured by gas. Pancreas: Essentially completely  obscured by gas. Spleen: Limited visualization due to gas. Right Kidney: Length: 9.8 cm. Echogenicity within normal limits. No mass or hydronephrosis visualized. Left Kidney: Length: 11.3 cm. Echogenicity within normal limits. No mass or hydronephrosis visualized. Abdominal aorta: No aneurysm visualized. Portions of aorta obscured by gas. Other findings: No demonstrable ascites. IMPRESSION: 1. Sludge with multiple tiny intermingled gallstones. No gallbladder wall thickening or pericholecystic fluid. 2. Diffuse increase in liver echogenicity, a finding indicative of hepatic steatosis. While no focal liver lesions are evident on this study, it must be cautioned that the sensitivity of ultrasound for detection of focal liver lesions is diminished significantly in this circumstance. 3. Pancreas and most of spleen obscured by gas. Much of the inferior  vena cava obscured by gas. Portions of aorta obscured by gas. Electronically Signed   By: Lowella Grip III M.D.   On: 07/01/2019 20:41   Dg Chest Portable 1 View  Result Date: 07/01/2019 CLINICAL DATA:  Lethargic with chest pain EXAM: PORTABLE CHEST 1 VIEW COMPARISON:  01/21/2019 FINDINGS: Postsurgical changes are again noted. Cardiac shadow is mildly enlarged but accentuated by the frontal technique. Right jugular central line is noted in satisfactory position. No pneumothorax is seen. Mild right basilar scarring is again noted. No focal infiltrate is seen. No bony abnormality is noted. IMPRESSION: Right jugular central line without pneumothorax. Right basilar scarring stable from the prior exam. Electronically Signed   By: Inez Catalina M.D.   On: 07/01/2019 18:27    Microbiology: Recent Results (from the past 240 hour(s))  SARS Coronavirus 2 (CEPHEID - Performed in [place] hospital lab), Hosp Order     Status: None   Collection Time: 07/01/19  5:44 PM   Specimen: Nasopharyngeal Swab  Result Value Ref Range Status   SARS Coronavirus 2 NEGATIVE NEGATIVE  Final    Comment: (NOTE) If result is NEGATIVE SARS-CoV-2 target nucleic acids are NOT DETECTED. The SARS-CoV-2 RNA is generally detectable in upper and lower  respiratory specimens during the acute phase of infection. The lowest  concentration of SARS-CoV-2 viral copies this assay can detect is 250  copies / mL. A negative result does not preclude SARS-CoV-2 infection  and should not be used as the sole basis for treatment or other  patient management decisions.  A negative result may occur with  improper specimen collection / handling, submission of specimen other  than nasopharyngeal swab, presence of viral mutation(s) within the  areas targeted by this assay, and inadequate number of viral copies  (<250 copies / mL). A negative result must be combined with clinical  observations, patient history, and epidemiological information. If result is POSITIVE SARS-CoV-2 target nucleic acids are DETECTED. The SARS-CoV-2 RNA is generally detectable in upper and lower  respiratory specimens dur ing the acute phase of infection.  Positive  results are indicative of active infection with SARS-CoV-2.  Clinical  correlation with patient history and other diagnostic information is  necessary to determine patient infection status.  Positive results do  not rule out bacterial infection or co-infection with other viruses. If result is PRESUMPTIVE POSTIVE SARS-CoV-2 nucleic acids MAY BE PRESENT.   A presumptive positive result was obtained on the submitted specimen  and confirmed on repeat testing.  While 2019 novel coronavirus  (SARS-CoV-2) nucleic acids may be present in the submitted sample  additional confirmatory testing may be necessary for epidemiological  and / or clinical management purposes  to differentiate between  SARS-CoV-2 and other Sarbecovirus currently known to infect humans.  If clinically indicated additional testing with an alternate test  methodology [phone]) is advised. The  SARS-CoV-2 RNA is generally  detectable in upper and lower respiratory sp ecimens during the acute  phase of infection. The expected result is Negative. Fact Sheet for Patients:  StrictlyIdeas.no Fact Sheet for Healthcare Providers: BankingDealers.co.za This test is not yet approved or cleared by the [place] FDA and has been authorized for detection and/or diagnosis of SARS-CoV-2 by FDA under an Emergency Use Authorization (EUA).  This EUA will remain in effect (meaning this test can be used) for the duration of the COVID-19 declaration under Section 564(b)(1) of the Act, 21 U.S.C. section 360bbb-3(b)(1), unless the authorization is terminated or revoked sooner. Performed at  [facility], [place] [address], [place], [place] [address]   Blood culture (routine x 2)     Status: None (Preliminary result)   Collection Time: 07/01/19  6:50 PM   Specimen: BLOOD  Result Value Ref Range Status   Specimen Description BLOOD LEFT ANTECUBITAL  Final   Special Requests   Final    BOTTLES DRAWN AEROBIC AND ANAEROBIC Blood Culture adequate volume   Culture   Final    NO GROWTH 2 DAYS Performed at [facility], [place] [address] [place], [place] [address]    Report Status PENDING  Incomplete  Blood culture (routine x 2)     Status: None (Preliminary result)   Collection Time: 07/01/19  6:55 PM   Specimen: BLOOD LEFT FOREARM  Result Value Ref Range Status   Specimen Description BLOOD LEFT FOREARM  Final   Special Requests   Final    BOTTLES DRAWN AEROBIC ONLY Blood Culture adequate volume   Culture   Final    NO GROWTH 2 DAYS Performed at [facility], [place] [address] [place], [place] [address]    Report Status PENDING  Incomplete  MRSA PCR Screening     Status: None   Collection Time: 07/01/19  9:57 PM   Specimen: Nasal Mucosa; Nasopharyngeal  Result Value Ref Range Status   MRSA by PCR NEGATIVE NEGATIVE Final    Comment:         The GeneXpert MRSA Assay (FDA approved for NASAL specimens only), is one component of a comprehensive MRSA colonization surveillance program. It is not intended to diagnose MRSA infection nor to guide or monitor treatment for MRSA infections. Performed at [facility], [place] [address], [place], [place] [address]      Labs: Basic Metabolic Panel: Recent Labs  Lab 07/01/19 1638 07/01/19 1906 07/02/19 0359 07/03/19 0441  NA 137 136 137 137  K 4.2 4.4 4.3 4.0  CL 96*  --  102 103  CO2 23  --  27 27  GLUCOSE 261*  --  137* 114*  BUN 33*  --  33* 27*  CREATININE 3.28*  --  2.54* 1.65*  CALCIUM 8.4*  --  8.4* 8.2*   Liver Function Tests: Recent Labs  Lab 07/01/19 1855 07/02/19 0359 07/03/19 0441  AST 157* 297* 268*  ALT 130* 233* 327*  ALKPHOS 68 65 54  BILITOT 1.4* 1.0 0.9  PROT 5.6* 5.3* 4.8*  ALBUMIN 2.7* 2.6* 2.4*   Recent Labs  Lab 07/01/19 1855  LIPASE 18   No results for input(s): AMMONIA in the last 168 hours. CBC: Recent Labs  Lab 07/01/19 1638 07/01/19 1906 07/02/19 0359 07/03/19 0441  WBC 12.6*  --  7.7 4.6  NEUTROABS  --   --   --  2.8  HGB 10.2* 10.2* 8.5* 8.0*  HCT 32.3* 30.0* 26.1* 25.3*  MCV 97.0  --  95.6 95.1  PLT 181  --  106* 91*   Cardiac Enzymes: No results for input(s): CKTOTAL, CKMB, CKMBINDEX, TROPONINI in the last 168 hours. BNP: BNP (last 3 results) Recent Labs    07/01/19 1639  BNP 3,872.1*    ProBNP (last 3 results) No results for input(s): PROBNP in the last 8760 hours.  CBG: Recent Labs  Lab 07/02/19 2005 07/03/19 0039 07/03/19 0433 07/03/19 0828 07/03/19 1135  GLUCAP 216* 108* 118* 187* 154*       Signed:  Dia Crawford, MD [facility] [phone] pager

## 2019-07-03 NOTE — Plan of Care (Signed)

## 2019-07-03 NOTE — Evaluation (Signed)
PT Cancellation Note  Patient Details Name: [name] MRN: [id number] DOB: 1951-12-14   Cancelled Treatment:    Reason Eval/Treat Not Completed: PT screened, no needs identified, will sign off   RN reports patient has been transferring to chair with one person assist. She spoke with patient's daughter today and he has been managing well at home. They feel safe with him returning home. Discussed with patient and he has no PT needs. (He "graduated" from [employer] and continues to do his exercises daily.    Jeanie Cooks Vermelle Cammarata, PT 07/03/2019, 11:35 AM

## 2019-07-03 NOTE — Progress Notes (Signed)
Progress Note  Patient Name: [name] Date of Encounter: 07/03/2019  Primary Cardiologist: Dr. Carlyle Dolly  Subjective   [name] feels clinically improved today.  He denies chest pain or shortness of breath.  He has much more alert and conversant.  Inpatient Medications    Scheduled Meds:  aspirin EC  81 mg Oral Daily   docusate sodium  100 mg Oral TID   DULoxetine  30 mg Oral Daily   finasteride  5 mg Oral Daily   heparin injection (subcutaneous)  5,000 Units Subcutaneous Q8H   insulin aspart  0-9 Units Subcutaneous Q4H   mouth rinse  15 mL Mouth Rinse BID   methadone  10 mg Oral Q12H   polyethylene glycol  17 g Oral BID   predniSONE  5 mg Oral Q breakfast   pregabalin  25 mg Oral QHS   tamsulosin  0.4 mg Oral QHS   Continuous Infusions:  sodium chloride     PRN Meds: acetaminophen **OR** acetaminophen, clonazePAM, HYDROmorphone (DILAUDID) injection, polyvinyl alcohol   Vital Signs    Vitals:   07/02/19 2010 07/02/19 2300 07/03/19 0300 07/03/19 0500  BP: 108/73 (!) 154/81 133/86   Pulse: 79 79 72   Resp: 19 19 17    Temp:  98.4 F (36.9 C) 98.2 F (36.8 C)   TempSrc:  Oral Oral   SpO2: 100% 98% 95%   Weight:    77.9 kg  Height:        Intake/Output Summary (Last 24 hours) at 07/03/2019 0754 Last data filed at 07/02/2019 1559 Gross per 24 hour  Intake --  Output 400 ml  Net -400 ml   Last 3 Weights 07/03/2019 07/01/2019 01/08/2019  Weight (lbs) 171 lb 11.8 oz 171 lb 1.2 oz (No Data)  Weight (kg) 77.9 kg 77.6 kg (No Data)      Telemetry    Sinus rhythm- Personally Reviewed  ECG    Not performed today- Personally Reviewed  Physical Exam   GEN: No acute distress.   Neck: No JVD Cardiac: RRR, 2-3 over 6 outflow tract murmur consistent with aortic stenosis, rubs, or gallops.  Respiratory: Clear to auscultation bilaterally. GI: Soft, nontender, non-distended  MS: No edema; No deformity. Neuro:  Nonfocal  Psych: Normal affect    Labs    High Sensitivity Troponin:   Recent Labs  Lab 07/01/19 1638 07/01/19 1838  TROPONINIHS 600* 567*      Cardiac EnzymesNo results for input(s): TROPONINI in the last 168 hours. No results for input(s): TROPIPOC in the last 168 hours.   Chemistry Recent Labs  Lab 07/01/19 1638 07/01/19 1855 07/01/19 1906 07/02/19 0359 07/03/19 0441  NA 137  --  136 137 137  K 4.2  --  4.4 4.3 4.0  CL 96*  --   --  102 103  CO2 23  --   --  27 27  GLUCOSE 261*  --   --  137* 114*  BUN 33*  --   --  33* 27*  CREATININE 3.28*  --   --  2.54* 1.65*  CALCIUM 8.4*  --   --  8.4* 8.2*  PROT  --  5.6*  --  5.3* 4.8*  ALBUMIN  --  2.7*  --  2.6* 2.4*  AST  --  157*  --  297* 268*  ALT  --  130*  --  233* 327*  ALKPHOS  --  68  --  65 54  BILITOT  --  1.4*  --  1.0 0.9  GFRNONAA 18*  --   --  25* 42*  GFRAA 21*  --   --  29* 48*  ANIONGAP 18*  --   --  8 7     Hematology Recent Labs  Lab 07/01/19 1638 07/01/19 1906 07/02/19 0359 07/03/19 0441  WBC 12.6*  --  7.7 4.6  RBC 3.33*  --  2.73* 2.66*  HGB 10.2* 10.2* 8.5* 8.0*  HCT 32.3* 30.0* 26.1* 25.3*  MCV 97.0  --  95.6 95.1  MCH 30.6  --  31.1 30.1  MCHC 31.6  --  32.6 31.6  RDW 13.6  --  13.6 13.5  PLT 181  --  106* 91*    BNP Recent Labs  Lab 07/01/19 1639  BNP 3,872.1*     DDimer No results for input(s): DDIMER in the last 168 hours.   Radiology    Dg Abdomen 1 View  Result Date: 07/01/2019 CLINICAL DATA:  Lethargic EXAM: ABDOMEN - 1 VIEW COMPARISON:  None. FINDINGS: Scattered large and small bowel gas is noted. No abnormal mass or abnormal calcifications are seen. No obstructive changes are noted. No free air is seen. Left hip replacement is noted. IMPRESSION: No acute abnormality seen. Electronically Signed   By: Inez Catalina M.D.   On: 07/01/2019 18:34   US Abdomen Complete  Result Date: 07/01/2019 CLINICAL DATA:  Elevated liver enzymes EXAM: ABDOMEN ULTRASOUND COMPLETE COMPARISON:  None. FINDINGS:  Gallbladder: There is sludge with multiple tiny intermingled gallstones, likely measuring 1-2 mm in size. There is no appreciable gallbladder wall thickening or pericholecystic fluid. No sonographic Murphy sign noted by sonographer. Common bile duct: Diameter: 3 mm. No intrahepatic, common hepatic, or common bile duct dilatation. Liver: No focal lesion identified. Liver echogenicity is increased diffusely. Portal vein is patent on color Doppler imaging with normal direction of blood flow towards the liver. IVC: No abnormality visualized in regions which can be visualized. Much of the inferior vena cava is obscured by gas. Pancreas: Essentially completely obscured by gas. Spleen: Limited visualization due to gas. Right Kidney: Length: 9.8 cm. Echogenicity within normal limits. No mass or hydronephrosis visualized. Left Kidney: Length: 11.3 cm. Echogenicity within normal limits. No mass or hydronephrosis visualized. Abdominal aorta: No aneurysm visualized. Portions of aorta obscured by gas. Other findings: No demonstrable ascites. IMPRESSION: 1. Sludge with multiple tiny intermingled gallstones. No gallbladder wall thickening or pericholecystic fluid. 2. Diffuse increase in liver echogenicity, a finding indicative of hepatic steatosis. While no focal liver lesions are evident on this study, it must be cautioned that the sensitivity of ultrasound for detection of focal liver lesions is diminished significantly in this circumstance. 3. Pancreas and most of spleen obscured by gas. Much of the inferior vena cava obscured by gas. Portions of aorta obscured by gas. Electronically Signed   By: Lowella Grip III M.D.   On: 07/01/2019 20:41   Dg Chest Portable 1 View  Result Date: 07/01/2019 CLINICAL DATA:  Lethargic with chest pain EXAM: PORTABLE CHEST 1 VIEW COMPARISON:  01/21/2019 FINDINGS: Postsurgical changes are again noted. Cardiac shadow is mildly enlarged but accentuated by the frontal technique. Right jugular  central line is noted in satisfactory position. No pneumothorax is seen. Mild right basilar scarring is again noted. No focal infiltrate is seen. No bony abnormality is noted. IMPRESSION: Right jugular central line without pneumothorax. Right basilar scarring stable from the prior exam. Electronically Signed   By: Linus Mako.D.  On: 07/01/2019 18:27    Cardiac Studies   2D echo (07/02/2019)    1. The left ventricle has normal systolic function with an ejection fraction of 60-65%. The cavity size was normal. Left ventricular diastolic Doppler parameters are consistent with pseudonormalization.  2. The right ventricle has normal systolic function. The cavity was normal. There is no increase in right ventricular wall thickness.  3. The mitral valve is abnormal. Mild thickening of the mitral valve leaflet. There is moderate mitral annular calcification present.  4. The aortic valve is abnormal. Severely thickening of the aortic valve. Severe calcifcation of the aortic valve. Aortic valve regurgitation is mild to moderate by color flow Doppler. Severe stenosis of the aortic valve.  5. The aortic root and ascending aorta are normal in size and structure.  6. The interatrial septum was not assessed.   Patient Profile     DEMONTRAY FRANTA is a 68 y.o. male with a hx of CAD status post prior stenting and CABG, postop atrial fibrillation, chronic combined CHF, ischemic cardiomyopathy, hypertension, hyperlipidemia, diabetes, mild to moderate aortic stenosis, peripheral vascular disease status post left BKA, rheumatoid arthritis, chronic kidney disease stage III, chronic hoarseness and chronic pain syndrome who is being seen today for the evaluation of possible cardiogenic shock at the request of Dr. Tawanna Solo.   Patient has longstanding history of CAD with prior angioplasty in 1993 and 1995. He had CABG 06/2015 at [facility] with LIMA-LAD, SVG-PDA, SVG-intermediate with post op afib. LVEF prior to CABG 30-35%.  After CABG LVEF improved to 45-50%. Last echo 08/2016 showed LVEF 30-35%, grade II diastolic dysfunction.   Assessment & Plan    1: Coronary artery disease- history of CAD status post stenting remotely, CABG at [facility] 7/16 with atypical chest pain.  Admitted with question cardiogenic shock.  His enzymes are low and flat.  Doubt ACS.  Given his atypical chest pain he would benefit from having outpatient pharmacologic Myoview stress test.  2: Diastolic heart failure- BNP elevated at 3000.  His lungs are clear.  He is on torsemide at home.  He denies shortness of breath.  I suspect this is related both to diastolic heart failure and aortic stenosis.  3: Cardiogenic shock- patient was admitted with altered mental status, hypotension and bradycardia.  He responded to atropine, epinephrine and fluid resuscitation.  His vital signs have improved.  I am not sure the etiology of his admitting presentation.  It could potentially have been related to dehydration.  He was on a beta-blocker prior to admission which can be held.  4: Hyperlipidemia-on high-dose statin therapy  5: Severe aortic stenosis- 2D echo yesterday revealed normal LV systolic function with severe aortic stenosis.  The patient however is asymptomatic from this and would be a poor candidate for intervention.  6: Acute on chronic renal insufficiency- serum creatinine has continued to fall from the mid 3 range down to 1.65.   [facility] will sign off.   Medication Recommendations: Hold beta-blocker, continue torsemide Other recommendations (labs, testing, etc): Outpatient pharmacologic Myoview stress test Follow up as an outpatient: We will arrange follow-up with Dr. [name] as an outpatient.  For questions or updates, please contact [place] Please consult [email or web address] for contact info under        Signed, Quay Burow, MD  07/03/2019, 7:54 AM

## 2019-07-06 LAB — CULTURE, BLOOD (ROUTINE X 2)
Culture: NO GROWTH
Culture: NO GROWTH
Special Requests: ADEQUATE
Special Requests: ADEQUATE

## 2019-07-08 NOTE — Progress Notes (Deleted)
Referring Provider: Dr. Wolfgang Phoenix Primary Care Physician:  [name], MD Primary Gastroenterologist:  Dr. Oneida Alar  No chief complaint on file.   HPI:   RAMAJ FRANGOS is a 68 y.o. male presenting today at the request of Dr. Wolfgang Phoenix for TCS. Past medical history significant for pancytopenia, HTN, T2DM, CAD status post prior stenting and CABG,  PAD s/p left BKA, chronic combined CHF, mild to moderate AS, RA, CKD, chronic methadone use  Patient was recently hospitalized from 07/01/19-07/03/19 with cardiogenic shock with unclear etiology. Possibly dehydration as patient has several days of vomiting, upper abdominal pain, and CP. Doubt ACS as enzymes were low and flat. Thought elevated troponin was due to demand ischemia. Patient is scheduled for Myoview stress test on 07/10/19 and cardiology follow up on 07/31/19.  LFTs elevated on admission and continued to increase during hospitalization. US abdomen with hepatic steatosis. No other evaluation.   Today he states   ? Ischemic hepatitis vs acute hepatitis with vomiting, upper abdominal pain, and CP for several days. However, LFTs not in range consistent with acute viral or ischemic hepatitis. History of elevated alk phos, now normal ?PBC, RA- Repeat HFP and CBC? ? Anemia, iron studies? Ferritin high, TIBC low, iron normal Past Medical History:  Diagnosis Date  . Acute on chronic combined systolic and diastolic CHF (congestive heart failure) ([place]) 09/02/2016  . Chronic back pain   . Chronic neck pain   . Coronary artery disease 1993   angioplasty after AMI  . Diabetes mellitus   . Diabetic neuropathy ([place])   . Hyperkalemia   . Hypertension   . Loculated pleural effusion 09/09/2016  . Pancytopenia ([place]) 08/13/2014  . 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  . Rheumatoid arthritis(714.0)   . Seizure ([place]) 08/13/2014   Pt denies    Past Surgical History:  Procedure Laterality Date  . CARDIAC  SURGERY    . FRACTURE SURGERY    . JOINT REPLACEMENT    . LEG AMPUTATION  06/216   left leg  . MUSCLE BIOPSY  06/2016    Current Outpatient Medications  Medication Sig Dispense Refill  . amitriptyline (ELAVIL) 50 MG tablet TAKE 1 AND 1/2 TABLETS BY MOUTH AT BEDTIME. (Patient taking differently: Take 75 mg by mouth at bedtime. ) 45 tablet 5  . aspirin EC 81 MG tablet Take 81 mg by mouth daily.    [name] atorvastatin (LIPITOR) 80 MG tablet TAKE ONE TABLET BY MOUTH ONCE DAILY. (Patient taking differently: Take 80 mg by mouth daily. ) 30 tablet 5  . Cholecalciferol (VITAMIN D3) 25 MCG (1000 UT) CHEW Chew 1 tablet by mouth daily.    . clonazePAM (KLONOPIN) 0.25 MG disintegrating tablet One po qd prn anxiety (Patient taking differently: Take 0.25 mg by mouth daily as needed (anxiety). ) 20 tablet 1  . docusate sodium (COLACE) 100 MG capsule Take 100 mg by mouth 3 (three) times daily.     . DULoxetine (CYMBALTA) 30 MG capsule Take 30 mg by mouth daily.     . finasteride (PROSCAR) 5 MG tablet Take 5 mg by mouth daily.     . hydroxychloroquine (PLAQUENIL) 200 MG tablet Take 200 mg by mouth daily.     . insulin aspart protamine- aspart (NOVOLOG MIX 70/30) (70-30) 100 UNIT/ML injection Inject 12 units into skin in the morning  and 8 units evening (Patient taking differently: Inject 8-12 Units into the skin See admin instructions. Inject 12 units  subcutaneously after breakfast and 8 units after supper) 10 mL 5  . methadone (DOLOPHINE) 10 MG tablet Take 10 mg by mouth every 6 (six) hours.     . methadone (DOLOPHINE) 5 MG tablet Take 5 mg by mouth 3 (three) times daily as needed (breakthrough pain).     . polyethylene glycol powder (MIRALAX) powder Take 17 g by mouth 2 (two) times daily. 255 g   . Polyvinyl Alcohol-Povidone (REFRESH OP) Place 1 drop into both eyes daily as needed (dry eyes).    . predniSONE (DELTASONE) 5 MG tablet TAKE 1 TABLET BY MOUTH ONCE A DAY WITH BREAKFAST. (Patient taking differently:  Take 5 mg by mouth daily with breakfast. ) 30 tablet 5  . pregabalin (LYRICA) 25 MG capsule TAKE ONE CAPSULE BY MOUTH AT BEDTIME. (Patient taking differently: Take 25 mg by mouth at bedtime. ) 30 capsule 5  . tamsulosin (FLOMAX) 0.4 MG CAPS capsule Take 1 capsule (0.4 mg total) by mouth at bedtime. 30 capsule 5  . torsemide (DEMADEX) 10 MG tablet TAKE 2 TABLETS BY MOUTH EVERY OTHER DAY. (Patient taking differently: Take 20 mg by mouth every other day. ) 30 tablet 5   No current facility-administered medications for this visit.     Allergies as of 07/09/2019 - Review Complete 07/01/2019  Allergen Reaction Noted  . Sulfa antibiotics Rash 05/22/2014    Family History  Problem Relation Age of Onset  . Diabetes Father   . Dementia Father   . Heart attack Brother        multiple brothers with MIs  . Hypertension Mother   . Stroke Mother     Social History   Socioeconomic History  . Marital status: Married    Spouse name: Not on file  . Number of children: Not on file  . Years of education: Not on file  . Highest education level: Not on file  Occupational History  . Not on file  Social Needs  . Financial resource strain: Not on file  . Food insecurity    Worry: Not on file    Inability: Not on file  . Transportation needs    Medical: Not on file    Non-medical: Not on file  Tobacco Use  . Smoking status: Never Smoker  . Smokeless tobacco: Never Used  Substance and Sexual Activity  . Alcohol use: No  . Drug use: No  . Sexual activity: Not on file  Lifestyle  . Physical activity    Days per week: Not on file    Minutes per session: Not on file  . Stress: Not on file  Relationships  . Social Herbalist on phone: Not on file    Gets together: Not on file    Attends religious service: Not on file    Active member of club or organization: Not on file    Attends meetings of clubs or organizations: Not on file    Relationship status: Not on file  . Intimate  partner violence    Fear of current or ex partner: Not on file    Emotionally abused: Not on file    Physically abused: Not on file    Forced sexual activity: Not on file  Other Topics Concern  . Not on file  Social History Narrative  . Not on file    Review of Systems: Gen: Denies any fever, chills, fatigue, weight loss, lack of appetite.  CV: Denies chest pain, heart palpitations, peripheral edema,  syncope.  Resp: Denies shortness of breath at rest or with exertion. Denies wheezing or cough.  GI: Denies dysphagia or odynophagia. Denies jaundice, hematemesis, fecal incontinence. GU : Denies urinary burning, urinary frequency, urinary hesitancy MS: Denies joint pain, muscle weakness, cramps, or limitation of movement.  Derm: Denies rash, itching, dry skin Psych: Denies depression, anxiety, memory loss, and confusion Heme: Denies bruising, bleeding, and enlarged lymph nodes.  Physical Exam: There were no vitals taken for this visit. General:   Alert and oriented. Pleasant and cooperative. Well-nourished and well-developed.  Head:  Normocephalic and atraumatic. Eyes:  Without icterus, sclera clear and conjunctiva pink.  Ears:  Normal auditory acuity. Nose:  No deformity, discharge,  or lesions. Mouth:  No deformity or lesions, oral mucosa pink.  Neck:  Supple, without mass or thyromegaly. Lungs:  Clear to auscultation bilaterally. No wheezes, rales, or rhonchi. No distress.  Heart:  S1, S2 present without murmurs appreciated.  Abdomen:  +BS, soft, non-tender and non-distended. No HSM noted. No guarding or rebound. No masses appreciated.  Rectal:  Deferred  Msk:  Symmetrical without gross deformities. Normal posture. Pulses:  Normal pulses noted. Extremities:  Without clubbing or edema. Neurologic:  Alert and  oriented x4;  grossly normal neurologically. Skin:  Intact without significant lesions or rashes. Cervical Nodes:  No significant cervical adenopathy. Psych:  Alert and  cooperative. Normal mood and affect.

## 2019-07-09 ENCOUNTER — Telehealth: Payer: Self-pay | Admitting: Gastroenterology

## 2019-07-09 ENCOUNTER — Ambulatory Visit: Payer: Medicare HMO | Admitting: Gastroenterology

## 2019-07-09 NOTE — Telephone Encounter (Signed)
Patient was scheduled for an appt today to arrange screening colonoscopy with Propofol. He was just inpatient with cardiogenic shock. In order to provide the best continuity of care, we have postponed his visit until August 13th, after he has seen cardiology and completed appropriate testing. Patient aware and no concerns.

## 2019-07-10 ENCOUNTER — Encounter (HOSPITAL_COMMUNITY)
Admission: RE | Admit: 2019-07-10 | Discharge: 2019-07-10 | Disposition: A | Discharge status: Home / Self Care | Payer: Medicare HMO | Source: Ambulatory Visit | Attending: Physician Assistant | Admitting: Physician Assistant

## 2019-07-10 ENCOUNTER — Other Ambulatory Visit: Payer: Self-pay

## 2019-07-10 ENCOUNTER — Ambulatory Visit (HOSPITAL_COMMUNITY)
Admission: RE | Admit: 2019-07-10 | Discharge: 2019-07-10 | Disposition: A | Discharge status: Home / Self Care | Payer: Medicare HMO | Source: Ambulatory Visit | Attending: Physician Assistant | Admitting: Physician Assistant

## 2019-07-10 ENCOUNTER — Encounter (HOSPITAL_COMMUNITY): Payer: Medicare HMO

## 2019-07-10 DIAGNOSIS — I5043 Acute on chronic combined systolic (congestive) and diastolic (congestive) heart failure: Secondary | ICD-10-CM | POA: Insufficient documentation

## 2019-07-10 LAB — NM MYOCAR MULTI W/SPECT W/WALL MOTION / EF
LV dias vol: 55 mL (ref 62–150)
LV sys vol: 34 mL
Peak HR: 100 {beats}/min
RATE: 0.35
Rest HR: 92 {beats}/min
SDS: 6
SRS: 0
SSS: 6
TID: 0.99

## 2019-07-10 MED ORDER — TECHNETIUM TC 99M TETROFOSMIN IV KIT
10.0000 | PACK | Freq: Once | INTRAVENOUS | Status: AC | PRN
  Administered 2019-07-10: 9.75 via INTRAVENOUS

## 2019-07-10 MED ORDER — TECHNETIUM TC 99M TETROFOSMIN IV KIT
30.0000 | PACK | Freq: Once | INTRAVENOUS | Status: AC | PRN
  Administered 2019-07-10: 27.6 via INTRAVENOUS

## 2019-07-10 MED ORDER — REGADENOSON 0.4 MG/5ML IV SOLN
INTRAVENOUS | Status: AC
  Administered 2019-07-10: 0.4 mg via INTRAVENOUS
  Filled 2019-07-10: qty 5

## 2019-07-10 MED ORDER — SODIUM CHLORIDE 0.9% FLUSH
INTRAVENOUS | Status: AC
  Administered 2019-07-10: 10 mL via INTRAVENOUS
  Filled 2019-07-10: qty 10

## 2019-07-11 ENCOUNTER — Ambulatory Visit (INDEPENDENT_AMBULATORY_CARE_PROVIDER_SITE_OTHER): Payer: Medicare HMO | Admitting: Otolaryngology

## 2019-07-11 DIAGNOSIS — R490 Dysphonia: Secondary | ICD-10-CM | POA: Diagnosis not present

## 2019-07-11 DIAGNOSIS — J382 Nodules of vocal cords: Secondary | ICD-10-CM | POA: Diagnosis not present

## 2019-07-22 ENCOUNTER — Other Ambulatory Visit: Payer: Self-pay | Admitting: Family Medicine

## 2019-07-22 NOTE — Telephone Encounter (Signed)
Ok times one plus one ref

## 2019-07-25 ENCOUNTER — Telehealth: Payer: Self-pay | Admitting: Student

## 2019-07-25 NOTE — Telephone Encounter (Signed)
PER PATIENT WIFE THEY HAVE A SCALE AND BP - THEY WILL HAVE MEDICATION LIST       Virtual Visit Pre-Appointment Phone Call  "(Name), I am calling you today to discuss your upcoming appointment. We are currently trying to limit exposure to the virus that causes COVID-19 by seeing patients at home rather than in the office."  1. "What is the BEST phone number to call the day of the visit?" - include this in appointment notes  2. Do you have or have access to (through a family member/friend) a smartphone with video capability that we can use for your visit?" a. If yes - list this number in appt notes as cell (if different from BEST phone #) and list the appointment type as a VIDEO visit in appointment notes b. If no - list the appointment type as a PHONE visit in appointment notes  3. Confirm consent - "In the setting of the current Covid19 crisis, you are scheduled for a (phone or video) visit with your provider on (date) at (time).  Just as we do with many in-office visits, in order for you to participate in this visit, we must obtain consent.  If you'd like, I can send this to your mychart (if signed up) or email for you to review.  Otherwise, I can obtain your verbal consent now.  All virtual visits are billed to your insurance company just like a normal visit would be.  By agreeing to a virtual visit, we'd like you to understand that the technology does not allow for your provider to perform an examination, and thus may limit your provider's ability to fully assess your condition. If your provider identifies any concerns that need to be evaluated in person, we will make arrangements to do so.  Finally, though the technology is pretty good, we cannot assure that it will always work on either your or our end, and in the setting of a video visit, we may have to convert it to a phone-only visit.  In either situation, we cannot ensure that we have a secure connection.  Are you willing to proceed?"  STAFF: Did the patient verbally acknowledge consent to telehealth visit? Document YES/NO here:  YES  4. Advise patient to be prepared - "Two hours prior to your appointment, go ahead and check your blood pressure, pulse, oxygen saturation, and your weight (if you have the equipment to check those) and write them all down. When your visit starts, your provider will ask you for this information. If you have an Apple Watch or Kardia device, please plan to have heart rate information ready on the day of your appointment. Please have a pen and paper handy nearby the day of the visit as well."  5. Give patient instructions for MyChart download to smartphone OR Doximity/Doxy.me as below if video visit (depending on what platform provider is using)  6. Inform patient they will receive a phone call 15 minutes prior to their appointment time (may be from unknown caller ID) so they should be prepared to answer    TELEPHONE CALL NOTE  [name] has been deemed a candidate for a follow-up tele-health visit to limit community exposure during the Covid-19 pandemic. I spoke with the patient via phone to ensure availability of phone/video source, confirm preferred email & phone number, and discuss instructions and expectations.  I reminded [name] to be prepared with any vital sign and/or heart rhythm information that could potentially be obtained via  home monitoring, at the time of his visit. I reminded [name] to expect a phone call prior to his visit.  [name] 07/25/2019 11:13 AM   INSTRUCTIONS FOR DOWNLOADING THE MYCHART APP TO SMARTPHONE  - The patient must first make sure to have activated MyChart and know their login information - If Apple, go to [employer] and type in MyChart in the search bar and download the app. If Android, ask patient to go to [employer] and type in [place] in the search bar and download the app. The app is free but as with any other app downloads, their  phone may require them to verify saved payment information or Apple/Android password.  - The patient will need to then log into the app with their MyChart username and password, and select [facility] as their healthcare provider to link the account. When it is time for your visit, go to the MyChart app, find appointments, and click Begin Video Visit. Be sure to Select Allow for your device to access the Microphone and Camera for your visit. You will then be connected, and your provider will be with you shortly.  **If they have any issues connecting, or need assistance please contact MyChart service desk [phone])**  **If using a computer, in order to ensure the best quality for their visit they will need to use either of the following Internet Browsers: [employer], or Google Chrome**  IF USING DOXIMITY or DOXY.ME - The patient will receive a link just prior to their visit by text.     FULL LENGTH CONSENT FOR TELE-HEALTH VISIT   I hereby voluntarily request, consent and authorize [facility] and its employed or contracted physicians, physician assistants, nurse practitioners or other licensed health care professionals (the Practitioner), to provide me with telemedicine health care services (the Services") as deemed necessary by the treating Practitioner. I acknowledge and consent to receive the Services by the Practitioner via telemedicine. I understand that the telemedicine visit will involve communicating with the Practitioner through live audiovisual communication technology and the disclosure of certain medical information by electronic transmission. I acknowledge that I have been given the opportunity to request an in-person assessment or other available alternative prior to the telemedicine visit and am voluntarily participating in the telemedicine visit.  I understand that I have the right to withhold or withdraw my consent to the use of telemedicine in the course of  my care at any time, without affecting my right to future care or treatment, and that the Practitioner or I may terminate the telemedicine visit at any time. I understand that I have the right to inspect all information obtained and/or recorded in the course of the telemedicine visit and may receive copies of available information for a reasonable fee.  I understand that some of the potential risks of receiving the Services via telemedicine include:   Delay or interruption in medical evaluation due to technological equipment failure or disruption;  Information transmitted may not be sufficient (e.g. poor resolution of images) to allow for appropriate medical decision making by the Practitioner; and/or   In rare instances, security protocols could fail, causing a breach of personal health information.  Furthermore, I acknowledge that it is my responsibility to provide information about my medical history, conditions and care that is complete and accurate to the best of my ability. I acknowledge that Practitioner's advice, recommendations, and/or decision may be based on factors not within their control, such as incomplete  or inaccurate data provided by me or distortions of diagnostic images or specimens that may result from electronic transmissions. I understand that the practice of medicine is not an exact science and that Practitioner makes no warranties or guarantees regarding treatment outcomes. I acknowledge that I will receive a copy of this consent concurrently upon execution via email to the email address I last provided but may also request a printed copy by calling the office of [facility].    I understand that my insurance will be billed for this visit.   I have read or had this consent read to me.  I understand the contents of this consent, which adequately explains the benefits and risks of the Services being provided via telemedicine.   I have been provided ample opportunity to ask  questions regarding this consent and the Services and have had my questions answered to my satisfaction.  I give my informed consent for the services to be provided through the use of telemedicine in my medical care  By participating in this telemedicine visit I agree to the above.

## 2019-07-31 ENCOUNTER — Telehealth (INDEPENDENT_AMBULATORY_CARE_PROVIDER_SITE_OTHER): Payer: Medicare HMO | Admitting: Student

## 2019-07-31 ENCOUNTER — Encounter: Payer: Self-pay | Admitting: Student

## 2019-07-31 ENCOUNTER — Encounter: Payer: Self-pay | Admitting: *Deleted

## 2019-07-31 VITALS — BP 123/65 | Ht 69.0 in

## 2019-07-31 DIAGNOSIS — I2510 Atherosclerotic heart disease of native coronary artery without angina pectoris: Secondary | ICD-10-CM

## 2019-07-31 DIAGNOSIS — I5042 Chronic combined systolic (congestive) and diastolic (congestive) heart failure: Secondary | ICD-10-CM

## 2019-07-31 DIAGNOSIS — I739 Peripheral vascular disease, unspecified: Secondary | ICD-10-CM

## 2019-07-31 DIAGNOSIS — I350 Nonrheumatic aortic (valve) stenosis: Secondary | ICD-10-CM

## 2019-07-31 DIAGNOSIS — N183 Chronic kidney disease, stage 3 unspecified: Secondary | ICD-10-CM

## 2019-07-31 DIAGNOSIS — E785 Hyperlipidemia, unspecified: Secondary | ICD-10-CM

## 2019-07-31 NOTE — Progress Notes (Signed)
Virtual Visit via Telephone Note   This visit type was conducted due to national recommendations for restrictions regarding the COVID-19 Pandemic (e.g. social distancing) in an effort to limit this patient's exposure and mitigate transmission in our community.  Due to his co-morbid illnesses, this patient is at least at moderate risk for complications without adequate follow up.  This format is felt to be most appropriate for this patient at this time.  The patient did not have access to video technology/had technical difficulties with video requiring transitioning to audio format only (telephone).  All issues noted in this document were discussed and addressed.  No physical exam could be performed with this format.  Please refer to the patient's chart for his  consent to telehealth for [facility].   Date:  07/31/2019   ID:  [name], DOB 1951/01/18, MRN [id number]  Patient Location: Home Provider Location: Office  PCP:  [name], MD  Cardiologist:  Carlyle Dolly, MD  Electrophysiologist:  None   Evaluation Performed:  Follow-Up Visit  Chief Complaint:  Hospital Follow-Up  History of Present Illness:    [name] is a 68 y.o. 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  He was most recently admitted to [facility] from 07/01/2019 to 07/03/2019 for evaluation of chest pain and AMS. While in the ED, he was bradycardic with heart rate in the 30's and hypotensive.  Given concern for cardiogenic shock, he was admitted for further evaluation.  He was treated with Atropine, Epinephrine, and fluid resuscitation. Troponin values were flat at 600 and 567. A repeat echocardiogram  was obtained and showed that his EF actually improved to 60 to 65% with no regional wall motion abnormalities. Was noted to have severe aortic stenosis. Creatinine was elevated to 3.28 on admission and this improved to 1.65 at the time of discharge and his AKI was thought to be secondary to dehydration. A stress test was recommended for further evaluation with close cardiology follow-up. This was performed on 07/10/2019 and showed a medium defect of mild severity present in the mid inferoseptal and mid inferior location. There was signficant gut uptake which was likely causing this defect and no large ischemic territories were noted. Was read as intermediate-risk due to reduced EF but this was normal by recent echocardiogram.   In talking with the patient and his wife today, he reports overall doing well since hospital discharge. He denies any recurrent episodes of chest pain. He denies any recent dyspnea but is not active at baseline as he is wheelchair-bound. Has a prosthesis but does not use this regularly due to a decrease in his strength. He denies any specific orthopnea, PND, or lower extremity edema. He does notice intermittent abdominal distention. He is unable to weigh himself daily.  He reports good compliance with his current medication regimen. They have been following his blood pressure closely and this has been stable. At 123/65 on most recent check.  The patient does not have symptoms concerning for COVID-19 infection (fever, chills, cough, or new shortness of breath).    Past Medical History:  Diagnosis Date  . Chronic back pain   . Chronic combined systolic (congestive) and diastolic (congestive) heart  failure ([place])    a. EF 30-35% in 2016 b. at 30-35% by echo in 08/2016 c. 06/2019: echo showing EF of 60-65% but found to have severe AS.   [name] Chronic neck pain   . Coronary artery disease    a. s/p CABG in 06/2015 with LIMA-LAD, SVG-PDA, SVG-Intermediate  . Diabetes mellitus   .  Diabetic neuropathy ([place])   . Hyperkalemia   . Hypertension   . Loculated pleural effusion 09/09/2016  . Pancytopenia ([place]) 08/13/2014  . 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  . Rheumatoid arthritis(714.0)   . Seizure ([place]) 08/13/2014   Pt denies   Past Surgical History:  Procedure Laterality Date  . CARDIAC SURGERY    . FRACTURE SURGERY    . JOINT REPLACEMENT    . LEG AMPUTATION  06/216   left leg  . MUSCLE BIOPSY  06/2016     Current Meds  Medication Sig  . amitriptyline (ELAVIL) 50 MG tablet TAKE 1 AND 1/2 TABLETS BY MOUTH AT BEDTIME. (Patient taking differently: Take 75 mg by mouth at bedtime. )  . aspirin EC 81 MG tablet Take 81 mg by mouth daily.  [name] atorvastatin (LIPITOR) 80 MG tablet TAKE ONE TABLET BY MOUTH ONCE DAILY. (Patient taking differently: Take 80 mg by mouth daily. )  . Cholecalciferol (VITAMIN D3) 25 MCG (1000 UT) CHEW Chew 1 tablet by mouth daily.  . clonazePAM (KLONOPIN) 0.25 MG disintegrating tablet DISSOLVE 1 TABLET BY MOUTH DAILY AS NEEDED FOR ANXIETY.  [name] docusate sodium (COLACE) 100 MG capsule Take 100 mg by mouth 3 (three) times daily.   . DULoxetine (CYMBALTA) 30 MG capsule Take 30 mg by mouth daily.   . finasteride (PROSCAR) 5 MG tablet Take 5 mg by mouth daily.   . hydroxychloroquine (PLAQUENIL) 200 MG tablet Take 200 mg by mouth daily.   . insulin aspart protamine- aspart (NOVOLOG MIX 70/30) (70-30) 100 UNIT/ML injection Inject 12 units into skin in the morning  and 8 units evening (Patient taking differently: Inject 8-12 Units into the skin See admin instructions. Inject 12 units subcutaneously after breakfast and 8 units after supper)  . methadone (DOLOPHINE) 10 MG tablet Take 10 mg by mouth every 6 (six) hours.   . methadone (DOLOPHINE) 5 MG tablet Take 5 mg by mouth 3 (three) times daily as needed (breakthrough pain).   . polyethylene glycol powder (MIRALAX) powder Take 17 g by mouth 2 (two) times daily.   . Polyvinyl Alcohol-Povidone (REFRESH OP) Place 1 drop into both eyes daily as needed (dry eyes).  . predniSONE (DELTASONE) 5 MG tablet TAKE 1 TABLET BY MOUTH ONCE A DAY WITH BREAKFAST. (Patient taking differently: Take 5 mg by mouth daily with breakfast. )  . pregabalin (LYRICA) 25 MG capsule TAKE ONE CAPSULE BY MOUTH AT BEDTIME. (Patient taking differently: Take 25 mg by mouth at bedtime. )  . tamsulosin (FLOMAX) 0.4 MG CAPS capsule Take 1 capsule (0.4 mg total) by mouth at bedtime.  . torsemide (DEMADEX) 10 MG tablet TAKE 2 TABLETS BY MOUTH EVERY OTHER DAY. (Patient taking differently: Take 20 mg by mouth every other day. )     Allergies:   Sulfa antibiotics   Social History   Tobacco Use  . Smoking status: Never Smoker  . Smokeless tobacco: Never Used  Substance Use Topics  . Alcohol use: No  . Drug use: No     Family Hx: The patient's family history includes Dementia in his  father; Diabetes in his father; Heart attack in his brother; Hypertension in his mother; Stroke in his mother.  ROS:   Please see the history of present illness.     All other systems reviewed and are negative.   Prior CV studies:   The following studies were reviewed today:  Echocardiogram: 07/02/2019 IMPRESSIONS    1. The left ventricle has normal systolic function with an ejection fraction of 60-65%. The cavity size was normal. Left ventricular diastolic Doppler parameters are consistent with pseudonormalization.  2. The right ventricle has normal systolic function. The cavity was normal. There is no increase in right ventricular wall thickness.  3. The mitral valve is abnormal. Mild thickening of the mitral valve leaflet. There is moderate mitral annular calcification present.  4. The aortic valve is abnormal. Severely thickening of the aortic valve. Severe calcifcation of the aortic valve. Aortic valve regurgitation is mild to moderate by color flow Doppler. Severe stenosis of the aortic valve.   5. The aortic root and ascending aorta are normal in size and structure.  6. The interatrial septum was not assessed.  NST: 07/10/2019  There was no ST segment deviation noted during stress.  Defect 1: There is a medium defect of mild severity present in the mid inferoseptal and mid inferior location. There is signficant gut uptake which is likely causing this defect. I suspect it is all soft tissue attenuation. No large ischemic territories.  This is an intermediate risk study based primarily on calculated LVEF. However, there appears to be grossly normal function. Likely due to improper gating.  Nuclear stress EF: 38%.  Labs/Other Tests and Data Reviewed:    EKG:  No ECG reviewed.  Recent Labs: 10/18/2018: TSH 1.660 07/01/2019: B Natriuretic Peptide 3,872.1 07/03/2019: ALT 327; BUN 27; Creatinine, Ser 1.65; Hemoglobin 8.0; Platelets 91; Potassium 4.0; Sodium 137   Recent Lipid Panel Lab Results  Component Value Date/Time   CHOL 142 10/18/2018 03:46 PM   TRIG 77 10/18/2018 03:46 PM   HDL 68 10/18/2018 03:46 PM   CHOLHDL 2.1 10/18/2018 03:46 PM   CHOLHDL 3.0 10/11/2016 05:00 AM   LDLCALC 59 10/18/2018 03:46 PM    Wt Readings from Last 3 Encounters:  07/03/19 171 lb 11.8 oz (77.9 kg)  05/07/18 175 lb (79.4 kg)  01/02/18 175 lb (79.4 kg)     Objective:    Vital Signs:  BP 123/65   Ht 5\' 9"  (1.753 m)   BMI 25.36 kg/m    General: Pleasant male sounding in NAD Psych: Normal affect. Neuro: Alert and oriented X 3. Lungs:  Resp regular and unlabored while talking on the phone.   ASSESSMENT & PLAN:    1. CAD - he is s/p CABG in 06/2015 with LIMA-LAD, SVG-PDA, SVG-Intermediate. Recent NST last month showed a medium defect of mild severity present in the mid inferoseptal and mid inferior location. There was signficant gut uptake which was likely causing this defect and no large ischemic territories were noted.  - he denies any recent chest pain or dyspnea on exertion.  -  continue ASA and statin therapy. BB therapy recently discontinued due to hypotension and will not restart at this time given SBP has been in the low-100's at times per their report.   2. Chronic Combined Systolic and Diastolic CHF - EF 32-35% in 2016, at 30-35% by echo in 08/2016. Most recent echo showed his EF had improved to 60-65%. He denies any orthopnea, PND, or lower extremity edema. Does experience occasional  abdominal distention.  - continue Torsemide 20mg  every other day. We reviewed that he can take an extra tablet for worsening edema or weight gain (once able to weigh on his scales).   3. Aortic Stenosis - was severe by most recent echocardiogram. Reviewed this with the patient and his wife today. He is not an ideal surgical candidate given his multiple medical issues and prior CABG but could perhaps be evaluated for TAVR. Will refer to the Structural Heart Team.  4. HLD - followed by PCP. LDL at 59 in 2019. Continue Atorvastatin 80mg  daily.   5. Stage 3 CKD - baseline 1.5 - 1.6. Peaked at 3.28 during recent admission, improved to 1.65 on most recent check. He is scheduled for repeat labs with his PCP in 2-3 weeks. Will request a copy of records once available.   6. PAD - s/p L BKA. Wheelchair bound at baseline but does have a prosthesis he uses intermittently.    COVID-19 Education: The signs and symptoms of COVID-19 were discussed with the patient and how to seek care for testing (follow up with PCP or arrange E-visit).  The importance of social distancing was discussed today.  Time:   Today, I have spent 27 minutes with the patient with telehealth technology discussing the above problems.     Medication Adjustments/Labs and Tests Ordered: Current medicines are reviewed at length with the patient today.  Concerns regarding medicines are outlined above.   Tests Ordered: No orders of the defined types were placed in this encounter.   Medication Changes: No orders of the  defined types were placed in this encounter.   Follow Up:  In Person in 3 month(s)  Signed, Erma Heritage, PA-C  07/31/2019 2:21 PM    [place]

## 2019-07-31 NOTE — Patient Instructions (Signed)
Medication Instructions:  Your physician recommends that you continue on your current medications as directed. Please refer to the Current Medication list given to you today.  If you need a refill on your cardiac medications before your next appointment, please call your pharmacy.   Lab work: NONE   If you have labs (blood work) drawn today and your tests are completely normal, you will receive your results only by: [name] MyChart Message (if you have MyChart) OR . A paper copy in the mail If you have any lab test that is abnormal or we need to change your treatment, we will call you to review the results.  Testing/Procedures: NONE   Follow-Up: At [facility], you and your health needs are our priority.  As part of our continuing mission to provide you with exceptional heart care, we have created designated Provider Care Teams.  These Care Teams include your primary Cardiologist (physician) and Advanced Practice Providers (APPs -  Physician Assistants and Nurse Practitioners) who all work together to provide you with the care you need, when you need it. You will need a follow up appointment in 3 months.  Please call our office 2 months in advance to schedule this appointment.  You may see Carlyle Dolly, MD or one of the following Advanced Practice Providers on your designated Care Team:   Bernerd Pho, PA-C [facility]) . Ermalinda Barrios, PA-C ([facility])  Any Other Special Instructions Will Be Listed Below (If Applicable). You have been referred to Structural Heart   Thank you for choosing [facility]!

## 2019-08-08 ENCOUNTER — Encounter: Payer: Self-pay | Admitting: Gastroenterology

## 2019-08-08 ENCOUNTER — Telehealth: Payer: Self-pay | Admitting: Gastroenterology

## 2019-08-08 ENCOUNTER — Ambulatory Visit: Payer: Medicare HMO | Admitting: Gastroenterology

## 2019-08-08 NOTE — Telephone Encounter (Signed)
PATIENT WAS A NO SHOW AND LETTER SENT  °

## 2019-08-08 NOTE — Telephone Encounter (Signed)
Noted  

## 2019-08-15 ENCOUNTER — Ambulatory Visit (INDEPENDENT_AMBULATORY_CARE_PROVIDER_SITE_OTHER): Payer: Medicare HMO | Admitting: Family Medicine

## 2019-08-15 ENCOUNTER — Encounter: Payer: Self-pay | Admitting: Family Medicine

## 2019-08-15 ENCOUNTER — Other Ambulatory Visit: Payer: Self-pay

## 2019-08-15 DIAGNOSIS — R21 Rash and other nonspecific skin eruption: Secondary | ICD-10-CM | POA: Diagnosis not present

## 2019-08-15 MED ORDER — DOXYCYCLINE HYCLATE 100 MG PO TABS: 100.0000 mg | ORAL_TABLET | Freq: Two times a day (BID) | ORAL | 0 refills | Status: DC

## 2019-08-15 NOTE — Progress Notes (Signed)
   Subjective:  Audio  Patient ID: [name], male    DOB: 10-19-1951, 68 y.o.   MRN: [id number]  HPI   Wife calls and states patient scratched his right leg on deck the first part of the week. Wife states the scratch is not draining but is a little red and she thinks he needs an antibiotic.  Virtual Visit via Video Note  I connected with Duanne Moron on 08/15/19 at 10:00 AM EDT by a video enabled telemedicine application and verified that I am speaking with the correct person using two identifiers.  Location: Patient: home Provider: office   I discussed the limitations of evaluation and management by telemedicine and the availability of in person appointments. The patient expressed understanding and agreed to proceed.  History of Present Illness:    Observations/Objective:   Assessment and Plan:   Follow Up Instructions:    I discussed the assessment and treatment plan with the patient. The patient was provided an opportunity to ask questions and all were answered. The patient agreed with the plan and demonstrated an understanding of the instructions.   The patient was advised to call back or seek an in-person evaluation if the symptoms worsen or if the condition fails to improve as anticipated.  I provided 18 minutes of non-face-to-face time during this encounter.  Patient has been through quite a bit lately.  Was recently hospitalized.  Now feeling quite a bit better.  Injured his leg.  Scraped it on his wheelchair.  Now has a sore the heel.  Slight redness around it.  No obvious discharge.  Somewhat tender.  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  Antibiotics prescribed symptom care discussed warning signs discussed

## 2019-08-19 ENCOUNTER — Institutional Professional Consult (permissible substitution): Payer: Medicare HMO | Admitting: Cardiovascular Disease

## 2019-08-19 DIAGNOSIS — E11311 Type 2 diabetes mellitus with unspecified diabetic retinopathy with macular edema: Secondary | ICD-10-CM | POA: Diagnosis not present

## 2019-08-19 DIAGNOSIS — I2510 Atherosclerotic heart disease of native coronary artery without angina pectoris: Secondary | ICD-10-CM | POA: Diagnosis not present

## 2019-08-19 DIAGNOSIS — I5020 Unspecified systolic (congestive) heart failure: Secondary | ICD-10-CM | POA: Diagnosis not present

## 2019-08-19 DIAGNOSIS — E1142 Type 2 diabetes mellitus with diabetic polyneuropathy: Secondary | ICD-10-CM | POA: Diagnosis not present

## 2019-08-19 DIAGNOSIS — I10 Essential (primary) hypertension: Secondary | ICD-10-CM | POA: Diagnosis not present

## 2019-08-19 DIAGNOSIS — M069 Rheumatoid arthritis, unspecified: Secondary | ICD-10-CM | POA: Diagnosis not present

## 2019-08-19 DIAGNOSIS — G8929 Other chronic pain: Secondary | ICD-10-CM | POA: Diagnosis not present

## 2019-08-19 NOTE — Progress Notes (Deleted)
[facility] Consult Note  No chief complaint on file.  History of Present Illness:67 yo male with history of chronic combined systolic and diastolic CHF, CAD s/p CABG in 2016, DM, HTN, PAD s/p left BKA, stage 3 CKD, rheumatoid arthritis, seizure disorder and severe aortic stenosis who is here today as a new consult for discussion regarding his aortic stenosis and possible TAVR. He is followed by Dr. [name]. He had a 3V CABG in 2016 and had atrial fibrillation following his bypass surgery. He has stage 3 chronic kidney disease. He has PAD and has undergone prior left BKA. Recent admission to [facility] July 2020 with chest pain and altered mental status. He was found to be bradycardic and hypotensive on admission. He was hydrated and his beta blocker was held. Echo 07/02/19 with normal LV systolic function and severe stenosis of the aortic valve.   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 Chronic back pain   . Chronic combined systolic (congestive) and diastolic (congestive) heart failure (HCC)    a. EF 30-35% in 2016 b. at 30-35% by echo in 08/2016 c. 06/2019: echo showing EF of 60-65% but found to have severe AS.   [name] Chronic neck pain   . Coronary artery disease    a. s/p CABG in 06/2015 with LIMA-LAD, SVG-PDA, SVG-Intermediate  . Diabetes mellitus   . Diabetic neuropathy ([place])   . Hyperkalemia   . Hypertension   . Loculated pleural effusion 09/09/2016  . Pancytopenia ([place]) 08/13/2014  . 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  . Rheumatoid arthritis(714.0)   . Seizure ([place]) 08/13/2014   Pt denies    Past Surgical History:  Procedure Laterality Date  . CARDIAC SURGERY    . FRACTURE SURGERY    . JOINT REPLACEMENT    . LEG AMPUTATION  06/216   left leg  . MUSCLE BIOPSY  06/2016     Current Outpatient Medications  Medication Sig Dispense Refill  . amitriptyline (ELAVIL) 50 MG tablet TAKE 1 AND 1/2 TABLETS BY MOUTH AT BEDTIME. (Patient taking differently: Take 75 mg by mouth at bedtime. ) 45 tablet 5  . aspirin EC 81 MG tablet Take 81 mg by mouth daily.    [name] atorvastatin (LIPITOR) 80 MG tablet TAKE ONE TABLET BY MOUTH ONCE DAILY. (Patient taking differently: Take 80 mg by mouth daily. ) 30 tablet 5  . Cholecalciferol (VITAMIN D3) 25 MCG (1000 UT) CHEW Chew 1 tablet by mouth daily.    . clonazePAM (KLONOPIN) 0.25 MG disintegrating tablet DISSOLVE 1 TABLET BY MOUTH DAILY AS NEEDED FOR ANXIETY. 20 tablet 1  . docusate sodium (COLACE) 100 MG capsule Take 100 mg by mouth 3 (three) times daily.     [name] doxycycline (VIBRA-TABS) 100 MG tablet Take 1 tablet (100 mg total) by mouth 2 (two) times daily. 20 tablet 0  . DULoxetine (CYMBALTA) 30 MG capsule Take 30 mg by mouth daily.     . finasteride (PROSCAR) 5 MG tablet Take 5 mg by mouth daily.     . hydroxychloroquine (PLAQUENIL) 200 MG tablet Take 200 mg by mouth daily.     . insulin aspart protamine- aspart (NOVOLOG MIX 70/30) (70-30) 100 UNIT/ML injection Inject 12 units into skin in the morning  and 8 units evening (Patient taking differently: Inject 8-12 Units into the skin See  admin instructions. Inject 12 units subcutaneously after breakfast and 8 units after supper) 10 mL 5  . methadone (DOLOPHINE) 10 MG tablet Take 10 mg by mouth every 6 (six) hours.     . methadone (DOLOPHINE) 5 MG tablet Take 5 mg by mouth 3 (three) times daily as needed (breakthrough pain).     . polyethylene glycol powder (MIRALAX) powder Take 17 g by mouth 2 (two) times daily. 255 g   . Polyvinyl Alcohol-Povidone (REFRESH OP) Place 1 drop into both eyes daily as needed (dry eyes).    . predniSONE (DELTASONE) 5 MG tablet TAKE 1 TABLET BY MOUTH ONCE A DAY WITH BREAKFAST. (Patient taking differently: Take 5 mg by mouth daily with breakfast. ) 30 tablet 5  .  pregabalin (LYRICA) 25 MG capsule TAKE ONE CAPSULE BY MOUTH AT BEDTIME. (Patient taking differently: Take 25 mg by mouth at bedtime. ) 30 capsule 5  . tamsulosin (FLOMAX) 0.4 MG CAPS capsule Take 1 capsule (0.4 mg total) by mouth at bedtime. 30 capsule 5  . torsemide (DEMADEX) 10 MG tablet TAKE 2 TABLETS BY MOUTH EVERY OTHER DAY. (Patient taking differently: Take 20 mg by mouth every other day. ) 30 tablet 5   No current facility-administered medications for this visit.     Allergies  Allergen Reactions  . Sulfa Antibiotics Rash    Social History   Socioeconomic History  . Marital status: Married    Spouse name: Not on file  . Number of children: Not on file  . Years of education: Not on file  . Highest education level: Not on file  Occupational History  . Not on file  Social Needs  . Financial resource strain: Not on file  . Food insecurity    Worry: Not on file    Inability: Not on file  . Transportation needs    Medical: Not on file    Non-medical: Not on file  Tobacco Use  . Smoking status: Never Smoker  . Smokeless tobacco: Never Used  Substance and Sexual Activity  . Alcohol use: No  . Drug use: No  . Sexual activity: Not on file  Lifestyle  . Physical activity    Days per week: Not on file    Minutes per session: Not on file  . Stress: Not on file  Relationships  . Social Herbalist on phone: Not on file    Gets together: Not on file    Attends religious service: Not on file    Active member of club or organization: Not on file    Attends meetings of clubs or organizations: Not on file    Relationship status: Not on file  . Intimate partner violence    Fear of current or ex partner: Not on file    Emotionally abused: Not on file    Physically abused: Not on file    Forced sexual activity: Not on file  Other Topics Concern  . Not on file  Social History Narrative  . Not on file    Family History  Problem Relation Age of Onset  . Diabetes  Father   . Dementia Father   . Heart attack Brother        multiple brothers with MIs  . Hypertension Mother   . Stroke Mother     Review of Systems:  As stated in the HPI and otherwise negative.   There were no vitals taken for this visit.  Physical Examination: General: Well  developed, well nourished, NAD  HEENT: OP clear, mucus membranes moist  SKIN: warm, dry. No rashes. Neuro: No focal deficits  Musculoskeletal: Muscle strength 5/5 all ext  Psychiatric: Mood and affect normal  Neck: No JVD, no carotid bruits, no thyromegaly, no lymphadenopathy.  Lungs:Clear bilaterally, no wheezes, rhonci, crackles Cardiovascular: Regular rate and rhythm. *** Loud, harsh, late peaking systolic murmur.  Abdomen:Soft. Bowel sounds present. Non-tender.  Extremities: *** No lower extremity edema. Pulses are 2 + in the bilateral DP/PT.  EKG:  EKG {ACTION; IS/IS BPZ:02585277} ordered today. The ekg ordered today demonstrates ***  Echo 07/02/19:  1. The left ventricle has normal systolic function with an ejection fraction of 60-65%. The cavity size was normal. Left ventricular diastolic Doppler parameters are consistent with pseudonormalization.  2. The right ventricle has normal systolic function. The cavity was normal. There is no increase in right ventricular wall thickness.  3. The mitral valve is abnormal. Mild thickening of the mitral valve leaflet. There is moderate mitral annular calcification present.  4. The aortic valve is abnormal. Severely thickening of the aortic valve. Severe calcifcation of the aortic valve. Aortic valve regurgitation is mild to moderate by color flow Doppler. Severe stenosis of the aortic valve.  5. The aortic root and ascending aorta are normal in size and structure.  6. The interatrial septum was not assessed.  FINDINGS  Left Ventricle: The left ventricle has normal systolic function, with an ejection fraction of 60-65%. The cavity size was normal. There is no  increase in left ventricular wall thickness. Left ventricular diastolic Doppler parameters are consistent with  pseudonormalization. Definity contrast agent was given IV to delineate the left ventricular endocardial borders.  Right Ventricle: The right ventricle has normal systolic function. The cavity was normal. There is no increase in right ventricular wall thickness.  Left Atrium: Left atrial size was normal in size.  Right Atrium: Right atrial size was normal in size. Right atrial pressure is estimated at 10 mmHg.  Interatrial Septum: The interatrial septum was not assessed.  Pericardium: There is no evidence of pericardial effusion.  Mitral Valve: The mitral valve is abnormal. Mild thickening of the mitral valve leaflet. There is moderate mitral annular calcification present. Mitral valve regurgitation is mild by color flow Doppler.  Tricuspid Valve: The tricuspid valve is normal in structure. Tricuspid valve regurgitation is mild by color flow Doppler.  Aortic Valve: The aortic valve is abnormal Severely thickening of the aortic valve. Severe calcifcation of the aortic valve. Aortic valve regurgitation is mild to moderate by color flow Doppler. There is Severe stenosis of the aortic valve, with a  calculated valve area of 0.92 cm.  Pulmonic Valve: The pulmonic valve was grossly normal. Pulmonic valve regurgitation is not visualized by color flow Doppler.  Aorta: The aortic root and ascending aorta are normal in size and structure.    +--------------+--------++ LEFT VENTRICLE         +----------------+---------++ +--------------+--------++ Diastology                PLAX 2D                +----------------+---------++ +--------------+--------++ LV e' lateral:  9.03 cm/s LVIDd:        4.70 cm  +----------------+---------++ +--------------+--------++ LV E/e' lateral:9.6       LVIDs:        3.20 cm  +----------------+---------++  +--------------+--------++ LV e' medial:   5.55 cm/s LV PW:        1.10 cm  +----------------+---------++ +--------------+--------++  LV E/e' medial: 15.7      LV IVS:       1.10 cm  +----------------+---------++ +--------------+--------++ LVOT diam:    2.00 cm  +--------------+--------++ LV SV:        61 ml    +--------------+--------++ LV SV Index:  31.08    +--------------+--------++ LVOT Area:    3.14 cm +--------------+--------++                        +--------------+--------++  +---------------+-------++-----------++ LEFT ATRIUM           Index       +---------------+-------++-----------++ LA diam:       3.10 cm1.57 cm/m  +---------------+-------++-----------++ LA Vol (A2C):  64.8 ml32.84 ml/m +---------------+-------++-----------++ LA Vol (A4C):  55.3 ml28.03 ml/m +---------------+-------++-----------++ LA Biplane Vol:60.6 ml30.71 ml/m +---------------+-------++-----------++ +------------+---------++-----------++ RIGHT ATRIUM         Index       +------------+---------++-----------++ RA Area:    10.80 cm            +------------+---------++-----------++ RA Volume:  21.40 ml 10.85 ml/m +------------+---------++-----------++  +------------------+------------++ AORTIC VALVE                   +------------------+------------++ AV Area (Vmax):   0.79 cm     +------------------+------------++ AV Area (Vmean):  0.83 cm     +------------------+------------++ AV Area (VTI):    0.92 cm     +------------------+------------++ AV Vmax:          262.00 cm/s  +------------------+------------++ AV Vmean:         179.400 cm/s +------------------+------------++ AV VTI:           0.630 m      +------------------+------------++ AV Peak Grad:     27.5 mmHg    +------------------+------------++ AV Mean Grad:     16.8 mmHg     +------------------+------------++ LVOT Vmax:        65.87 cm/s   +------------------+------------++ LVOT Vmean:       47.467 cm/s  +------------------+------------++ LVOT VTI:         0.184 m      +------------------+------------++ LVOT/AV VTI ratio:0.29         +------------------+------------++   +-------------+-------++ AORTA                +-------------+-------++ Ao Root diam:3.00 cm +-------------+-------++  +--------------+----------++  +---------------+-----------++ MITRAL VALVE              TRICUSPID VALVE            +--------------+----------++  +---------------+-----------++ MV Area (PHT):3.85 cm    TR Peak grad:  10.2 mmHg   +--------------+----------++  +---------------+-----------++ MV Peak grad: 3.7 mmHg    TR Vmax:       170.00 cm/s +--------------+----------++  +---------------+-----------++ MV Mean grad: 2.0 mmHg   +--------------+----------++  +--------------+-------+ MV Vmax:      0.97 m/s    SHUNTS                +--------------+----------++  +--------------+-------+ MV Vmean:     60.0 cm/s   Systemic VTI: 0.18 m  +--------------+----------++  +--------------+-------+ MV VTI:       0.33 m      Systemic Diam:2.00 cm +--------------+----------++  +--------------+-------+ MV PHT:       57.13 msec +--------------+----------++ MV Decel Time:197 msec   +--------------+----------++ +--------------+-----------++ MV E velocity:87.00 cm/s  +--------------+-----------++ MV A velocity:100.00 cm/s +--------------+-----------++ MV E/A ratio: 0.87        +--------------+-----------++  Recent Labs: 10/18/2018: TSH 1.660 07/01/2019: B Natriuretic Peptide 3,872.1 07/03/2019: ALT 327; BUN 27; Creatinine, Ser 1.65; Hemoglobin 8.0; Platelets 91; Potassium 4.0; Sodium 137   Lipid Panel    Component Value Date/Time   CHOL 142 10/18/2018 1546   TRIG 77 10/18/2018 1546   HDL  68 10/18/2018 1546   CHOLHDL 2.1 10/18/2018 1546   CHOLHDL 3.0 10/11/2016 0500   VLDL 9 10/11/2016 0500   LDLCALC 59 10/18/2018 1546     Wt Readings from Last 3 Encounters:  07/03/19 171 lb 11.8 oz (77.9 kg)  05/07/18 175 lb (79.4 kg)  01/02/18 175 lb (79.4 kg)     Other studies Reviewed: Additional studies/ records that were reviewed today include: ***. Review of the above records demonstrates: ***   Assessment and Plan:   1. Severe Aortic Valve Stenosis: He has severe, stage D aortic valve stenosis. I have personally reviewed the echo images. The aortic valve is thickened, calcified with limited leaflet mobility. I think he would benefit from AVR. Given his comorbidities, he is not a good candidate for conventional AVR by surgical approach. I think he may be a good candidate for TAVR.   STS Risk Score: ***  I have reviewed the natural history of aortic stenosis with the patient and their family members  who are present today. We have discussed the limitations of medical therapy and the poor prognosis associated with symptomatic aortic stenosis. We have reviewed potential treatment options, including palliative medical therapy, conventional surgical aortic valve replacement, and transcatheter aortic valve replacement. We discussed treatment options in the context of the patient's specific comorbid medical conditions.    *** would like to proceed with planning for TAVR. I will arrange a right and left heart catheterization at [facility] ***. Risks and benefits of the cath procedure and the valve procedure are reviewed with the patient. After the cath, *** will have a cardiac CT, CTA of the chest/abdomen and pelvis, carotid artery dopplers, PT assessment and PFTs and will then be referred to see one of the CT surgeons on our TAVR team.      Current medicines are reviewed at length with the patient today.  The patient {ACTIONS; HAS/DOES NOT HAVE:19233} concerns regarding medicines.  The  following changes have been made:  {PLAN; NO CHANGE:13088:s}  Labs/ tests ordered today include: *** No orders of the defined types were placed in this encounter.    Disposition:   FU with *** in {gen number 6-72:094709} {TIME; UNITS DAY/WEEK/MONTH:19136}   Signed, Lauree Chandler, MD 08/19/2019 8:28 AM    [facility] [address], [place], [place]  [address] Phone: [phone]; Fax: [phone]

## 2019-08-21 ENCOUNTER — Telehealth: Payer: Self-pay | Admitting: Family Medicine

## 2019-08-21 MED ORDER — BLOOD GLUCOSE MONITOR KIT: PACK | 0 refills | Status: AC

## 2019-08-21 NOTE — Telephone Encounter (Signed)
Pt needs orders for a new glucometer & test strips. His current machine has quit working  Please advise & call pt when done      [employer]

## 2019-08-22 NOTE — Telephone Encounter (Signed)
Prescription faxed to pharmacy. Patient notified. 

## 2019-08-26 ENCOUNTER — Telehealth: Payer: Self-pay | Admitting: Family Medicine

## 2019-08-26 ENCOUNTER — Other Ambulatory Visit: Payer: Self-pay | Admitting: *Deleted

## 2019-08-26 MED ORDER — CEPHALEXIN 500 MG PO CAPS: 500.0000 mg | ORAL_CAPSULE | Freq: Three times a day (TID) | ORAL | 0 refills | Status: DC

## 2019-08-26 NOTE — Telephone Encounter (Signed)
Med sent to pharm. Pt notified.  

## 2019-08-26 NOTE — Telephone Encounter (Signed)
Keflex 500 tid ten d

## 2019-08-26 NOTE — Telephone Encounter (Signed)
Patient leg still not healing and requesting another around of antibiotics called into [place]

## 2019-08-28 ENCOUNTER — Telehealth: Payer: Self-pay | Admitting: Family Medicine

## 2019-08-28 ENCOUNTER — Encounter: Payer: Self-pay | Admitting: Family Medicine

## 2019-08-28 DIAGNOSIS — S81801A Unspecified open wound, right lower leg, initial encounter: Secondary | ICD-10-CM

## 2019-08-28 NOTE — Telephone Encounter (Signed)
Wound care referral placed and wife is aware

## 2019-08-28 NOTE — Telephone Encounter (Signed)
Wife doesn't think place on his leg is getting much better.  Thinks he needs to be referred to the wound clinic or needs iv antibiotics.

## 2019-08-28 NOTE — Telephone Encounter (Signed)
Pt wife states that she has been giving pt antibiotics as directed but does not seem to be working. Wife states pt usually needs PICC line placed or wound care. Wife states the wound is scabbed over in 2-3 places, still red around the area(redness has not went down any), no drainage, it is a little warm at the red areas and is a little tender per wife. Please advise. Thank you

## 2019-08-28 NOTE — Progress Notes (Signed)
Structural Heart Clinic Consult Note  Chief Complaint  Patient presents with  . Follow-up    CAD   History of Present Illness: 68 yo male with history of chronic combined systolic and diastolic CHF, ischemic cardiomyopathy, CAD s/p 3V CABG in 2016, DM, HTN, post-operative atrial fibrillation, stage 3 chronic kidney disease, hyperlipidemia, PAD s/p left below knee amputation, rheumatoid arthritis and chronic neck pain and neuropathy with recent finding of severe aortic stenosis. He is here today as a new consult in the structural heart clinic for further discussion regarding his aortic stenosis and possible TAVR. He is followed in our office by Dr. [name]. He was admitted to [facility] July 01, 2019 with chest pain and found to have hypotensive and bradycardic. Troponin was mildly elevated. He was fluid resuscitated. Echo 07/02/19 with LVEF=60-65%. The aortic valve leaflets are thickened and calcified with limited leaflets mobility. The mean gradient is 16.8 mmHg, peak gradient 27.5 mmHg, AVA 0.79 cm2, dimensionless index 0.29. He had acute renal failure during this admission felt to be due to dehydration. Nuclear stress test July 2020 with an inferior wall perfusion defect not felt to be due to ischemia.   He was seen for follow up by virtual visit 07/31/19 by Bernerd Pho, PA-C in our [facility] office in [place], [place]. He reported no dyspnea or chest pain. He is wheelchair bound. He denied weight gain or lower extremity edema.   He tells me today that he has been having severe chest pain over the past 48 hours. This is described as a burning sensation in the center of his chest pain. Worsened dyspnea. No edema in right lower extremity. He has been feeling weak. He lives with his wife in [place], [place]. He has full dentures. He is retired from [employer]. He is having no active chest pain at this time.   Primary Care Physician: [name], MD Primary Cardiologist: Carlyle Dolly Referring  Cardiologist: Carlyle Dolly  Past Medical History:  Diagnosis Date  . Aortic stenosis   . Chronic back pain   . Chronic combined systolic (congestive) and diastolic (congestive) heart failure (HCC)    a. EF 30-35% in 2016 b. at 30-35% by echo in 08/2016 c. 06/2019: echo showing EF of 60-65% but found to have severe AS.   [name] Chronic neck pain   . Coronary artery disease    a. s/p CABG in 06/2015 with LIMA-LAD, SVG-PDA, SVG-Intermediate  . Diabetes mellitus   . Diabetic neuropathy ([place])   . Hyperkalemia   . Hypertension   . Loculated pleural effusion 09/09/2016  . Pancytopenia ([place]) 08/13/2014  . 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  . Rheumatoid arthritis(714.0)   . Seizure ([place]) 08/13/2014   Pt denies    Past Surgical History:  Procedure Laterality Date  . 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 CARDIAC SURGERY    . FRACTURE SURGERY    . MUSCLE BIOPSY  06/2016  . right foot surgery-Toe amputations    . TOTAL HIP ARTHROPLASTY Left     Current Outpatient Medications  Medication Sig Dispense Refill  . amitriptyline (ELAVIL) 50 MG tablet TAKE 1 AND 1/2 TABLETS BY MOUTH AT BEDTIME. (Patient taking differently: Take 75 mg by mouth at bedtime. ) 45 tablet 5  . aspirin EC 81 MG tablet Take 81 mg by mouth daily.    [name] atorvastatin (LIPITOR) 80 MG tablet TAKE ONE TABLET BY MOUTH ONCE DAILY. (Patient taking differently:  Take 80 mg by mouth daily. ) 30 tablet 5  . blood glucose meter kit and supplies KIT Dispense based on patient and insurance preference. Use up to 3 times daily as directed. DX:E11.9 1 each 0  . cephALEXin (KEFLEX) 500 MG capsule Take 1 capsule (500 mg total) by mouth 3 (three) times daily. 30 capsule 0  . Cholecalciferol (VITAMIN D3) 25 MCG (1000 UT) CHEW Chew 1 tablet by mouth daily.    . clonazePAM (KLONOPIN) 0.25 MG disintegrating tablet DISSOLVE 1 TABLET BY MOUTH DAILY AS NEEDED FOR ANXIETY. 20 tablet 1  .  docusate sodium (COLACE) 100 MG capsule Take 100 mg by mouth 3 (three) times daily.     [name] doxycycline (VIBRA-TABS) 100 MG tablet Take 1 tablet (100 mg total) by mouth 2 (two) times daily. 20 tablet 0  . DULoxetine (CYMBALTA) 30 MG capsule Take 30 mg by mouth daily.     . finasteride (PROSCAR) 5 MG tablet Take 5 mg by mouth daily.     . hydroxychloroquine (PLAQUENIL) 200 MG tablet Take 200 mg by mouth daily.     . insulin aspart protamine- aspart (NOVOLOG MIX 70/30) (70-30) 100 UNIT/ML injection Inject 12 units into skin in the morning  and 8 units evening (Patient taking differently: Inject 8-12 Units into the skin See admin instructions. Inject 12 units subcutaneously after breakfast and 8 units after supper) 10 mL 5  . methadone (DOLOPHINE) 10 MG tablet Take 10 mg by mouth every 6 (six) hours.     . methadone (DOLOPHINE) 5 MG tablet Take 5 mg by mouth 3 (three) times daily as needed (breakthrough pain).     . polyethylene glycol powder (MIRALAX) powder Take 17 g by mouth 2 (two) times daily. 255 g   . Polyvinyl Alcohol-Povidone (REFRESH OP) Place 1 drop into both eyes daily as needed (dry eyes).    . predniSONE (DELTASONE) 5 MG tablet TAKE 1 TABLET BY MOUTH ONCE A DAY WITH BREAKFAST. (Patient taking differently: Take 5 mg by mouth daily with breakfast. ) 30 tablet 5  . pregabalin (LYRICA) 25 MG capsule TAKE ONE CAPSULE BY MOUTH AT BEDTIME. (Patient taking differently: Take 25 mg by mouth at bedtime. ) 30 capsule 5  . tamsulosin (FLOMAX) 0.4 MG CAPS capsule Take 1 capsule (0.4 mg total) by mouth at bedtime. 30 capsule 5  . torsemide (DEMADEX) 10 MG tablet TAKE 2 TABLETS BY MOUTH EVERY OTHER DAY. (Patient taking differently: Take 20 mg by mouth every other day. ) 30 tablet 5   No current facility-administered medications for this visit.     Allergies  Allergen Reactions  . Sulfa Antibiotics Rash    Social History   Socioeconomic History  . Marital status: Married    Spouse name: Not on  file  . Number of children: 2  . Years of education: Not on file  . Highest education level: Not on file  Occupational History  . Occupation: Retired Barista  . Financial resource strain: Not on file  . Food insecurity    Worry: Not on file    Inability: Not on file  . Transportation needs    Medical: Not on file    Non-medical: Not on file  Tobacco Use  . Smoking status: Never Smoker  . Smokeless tobacco: Never Used  Substance and Sexual Activity  . Alcohol use: No  . Drug use: No  . Sexual activity: Not on file  Lifestyle  . Physical activity  Days per week: Not on file    Minutes per session: Not on file  . Stress: Not on file  Relationships  . Social Herbalist on phone: Not on file    Gets together: Not on file    Attends religious service: Not on file    Active member of club or organization: Not on file    Attends meetings of clubs or organizations: Not on file    Relationship status: Not on file  . Intimate partner violence    Fear of current or ex partner: Not on file    Emotionally abused: Not on file    Physically abused: Not on file    Forced sexual activity: Not on file  Other Topics Concern  . Not on file  Social History Narrative  . Not on file    Family History  Problem Relation Age of Onset  . Diabetes Father   . Dementia Father   . Heart attack Brother        multiple brothers with MIs  . Hypertension Mother   . Stroke Mother     Review of Systems:  As stated in the HPI and otherwise negative.   BP 118/74   Pulse 100   Ht _0  (1.753 m)   Wt 180 lb (81.6 kg)   SpO2 95%   BMI 26.58 kg/m   Physical Examination: General: Well developed, well nourished, NAD  HEENT: OP clear, mucus membranes moist  SKIN: warm, dry. No rashes. Neuro: No focal deficits  Musculoskeletal: Muscle strength 5/5 all ext  Psychiatric: Mood and affect normal  Neck: No JVD, no carotid bruits, no thyromegaly, no lymphadenopathy.   Lungs:Clear bilaterally, no wheezes, rhonci, crackles Cardiovascular: Regular rate and rhythm. Loud, harsh, late peaking systolic murmur.  Abdomen:Soft. Bowel sounds present. Non-tender.  Extremities: No right lower extremity edema. Left BKA.     Echo 07/02/19:  1. The left ventricle has normal systolic function with an ejection fraction of 60-65%. The cavity size was normal. Left ventricular diastolic Doppler parameters are consistent with pseudonormalization.  2. The right ventricle has normal systolic function. The cavity was normal. There is no increase in right ventricular wall thickness.  3. The mitral valve is abnormal. Mild thickening of the mitral valve leaflet. There is moderate mitral annular calcification present.  4. The aortic valve is abnormal. Severely thickening of the aortic valve. Severe calcifcation of the aortic valve. Aortic valve regurgitation is mild to moderate by color flow Doppler. Severe stenosis of the aortic valve.  5. The aortic root and ascending aorta are normal in size and structure.  6. The interatrial septum was not assessed.  FINDINGS  Left Ventricle: The left ventricle has normal systolic function, with an ejection fraction of 60-65%. The cavity size was normal. There is no increase in left ventricular wall thickness. Left ventricular diastolic Doppler parameters are consistent with  pseudonormalization. Definity contrast agent was given IV to delineate the left ventricular endocardial borders.  Right Ventricle: The right ventricle has normal systolic function. The cavity was normal. There is no increase in right ventricular wall thickness.  Left Atrium: Left atrial size was normal in size.  Right Atrium: Right atrial size was normal in size. Right atrial pressure is estimated at 10 mmHg.  Interatrial Septum: The interatrial septum was not assessed.  Pericardium: There is no evidence of pericardial effusion.  Mitral Valve: The mitral valve is  abnormal. Mild thickening of the mitral valve leaflet. There  is moderate mitral annular calcification present. Mitral valve regurgitation is mild by color flow Doppler.  Tricuspid Valve: The tricuspid valve is normal in structure. Tricuspid valve regurgitation is mild by color flow Doppler.  Aortic Valve: The aortic valve is abnormal Severely thickening of the aortic valve. Severe calcifcation of the aortic valve. Aortic valve regurgitation is mild to moderate by color flow Doppler. There is Severe stenosis of the aortic valve, with a  calculated valve area of 0.92 cm.  Pulmonic Valve: The pulmonic valve was grossly normal. Pulmonic valve regurgitation is not visualized by color flow Doppler.  Aorta: The aortic root and ascending aorta are normal in size and structure.    +--------------+--------++ LEFT VENTRICLE         +----------------+---------++ +--------------+--------++ Diastology                PLAX 2D                +----------------+---------++ +--------------+--------++ LV e' lateral:  9.03 cm/s LVIDd:        4.70 cm  +----------------+---------++ +--------------+--------++ LV E/e' lateral:9.6       LVIDs:        3.20 cm  +----------------+---------++ +--------------+--------++ LV e' medial:   5.55 cm/s LV PW:        1.10 cm  +----------------+---------++ +--------------+--------++ LV E/e' medial: 15.7      LV IVS:       1.10 cm  +----------------+---------++ +--------------+--------++ LVOT diam:    2.00 cm  +--------------+--------++ LV SV:        61 ml    +--------------+--------++ LV SV Index:  31.08    +--------------+--------++ LVOT Area:    3.14 cm +--------------+--------++                        +--------------+--------++  +---------------+-------++-----------++ LEFT ATRIUM           Index       +---------------+-------++-----------++ LA diam:       3.10 cm1.57 cm/m   +---------------+-------++-----------++ LA Vol (A2C):  64.8 ml32.84 ml/m +---------------+-------++-----------++ LA Vol (A4C):  55.3 ml28.03 ml/m +---------------+-------++-----------++ LA Biplane Vol:60.6 ml30.71 ml/m +---------------+-------++-----------++ +------------+---------++-----------++ RIGHT ATRIUM         Index       +------------+---------++-----------++ RA Area:    10.80 cm            +------------+---------++-----------++ RA Volume:  21.40 ml 10.85 ml/m +------------+---------++-----------++  +------------------+------------++ AORTIC VALVE                   +------------------+------------++ AV Area (Vmax):   0.79 cm     +------------------+------------++ AV Area (Vmean):  0.83 cm     +------------------+------------++ AV Area (VTI):    0.92 cm     +------------------+------------++ AV Vmax:          262.00 cm/s  +------------------+------------++ AV Vmean:         179.400 cm/s +------------------+------------++ AV VTI:           0.630 m      +------------------+------------++ AV Peak Grad:     27.5 mmHg    +------------------+------------++ AV Mean Grad:     16.8 mmHg    +------------------+------------++ LVOT Vmax:        65.87 cm/s   +------------------+------------++ LVOT Vmean:       47.467 cm/s  +------------------+------------++ LVOT VTI:         0.184 m      +------------------+------------++ LVOT/AV VTI ratio:0.29         +------------------+------------++   +-------------+-------++  AORTA                +-------------+-------++ Ao Root diam:3.00 cm +-------------+-------++  +--------------+----------++  +---------------+-----------++ MITRAL VALVE              TRICUSPID VALVE            +--------------+----------++  +---------------+-----------++ MV Area (PHT):3.85 cm    TR Peak grad:  10.2 mmHg    +--------------+----------++  +---------------+-----------++ MV Peak grad: 3.7 mmHg    TR Vmax:       170.00 cm/s +--------------+----------++  +---------------+-----------++ MV Mean grad: 2.0 mmHg   +--------------+----------++  +--------------+-------+ MV Vmax:      0.97 m/s    SHUNTS                +--------------+----------++  +--------------+-------+ MV Vmean:     60.0 cm/s   Systemic VTI: 0.18 m  +--------------+----------++  +--------------+-------+ MV VTI:       0.33 m      Systemic Diam:2.00 cm +--------------+----------++  +--------------+-------+ MV PHT:       57.13 msec +--------------+----------++ MV Decel Time:197 msec   +--------------+----------++ +--------------+-----------++ MV E velocity:87.00 cm/s  +--------------+-----------++ MV A velocity:100.00 cm/s +--------------+-----------++ MV E/A ratio: 0.87        +--------------+-----------++   Recent Labs: 10/18/2018: TSH 1.660 07/01/2019: B Natriuretic Peptide 3,872.1 07/03/2019: ALT 327; BUN 27; Creatinine, Ser 1.65; Hemoglobin 8.0; Platelets 91; Potassium 4.0; Sodium 137    Wt Readings from Last 3 Encounters:  08/29/19 180 lb (81.6 kg)  07/03/19 171 lb 11.8 oz (77.9 kg)  05/07/18 175 lb (79.4 kg)     Other studies Reviewed: Additional studies/ records that were reviewed today include: Echo images, office notes, hospital notes.   STS Risk Score:  Risk of Mortality: 2.731% Renal Failure: 6.697% Permanent Stroke: 1.520% Prolonged Ventilation: 9.262% DSW Infection: 0.165% Reoperation: 5.325% Morbidity or Mortality: 17.850% Short Length of Stay: 26.376% Long Length of Stay: 10.727%  Assessment and Plan:   1. CAD s/p CABG with unstable angina: He is having chest pain that is concerning for unstable angina. He is not having chest pain at this time. He will need to be admitted. There are no beds at [facility] right now. He will go to the ED and will be  admitted by the cardiology team. I will plan to cycle troponin, admit to telemetry. Will only start heparin if he has elevated troponin. Will plan right and left heart catheterization tomorrow. Risks and benefits of cath reviewed with pt. Continue ASA and statin.   Severe Aortic Valve Stenosis: He likely has severe aortic valve stenosis. He is now having chest pain dyspnea. I suspect we will need to move toward TAVR after his cardiac cath is completed. Will assess valve in the cath lab tomorrow. I have personally reviewed the echo images. The aortic valve is thickened, calcified with limited leaflet mobility but his mean gradient is only 16 mmHg. His LV systolic function is low normal.  I reviewed the TAVR procedure in detail.      Current medicines are reviewed at length with the patient today.  The patient does not have concerns regarding medicines.  The following changes have been made:  no change  Labs/ tests ordered today include:  No orders of the defined types were placed in this encounter.    Disposition:   FU will be planned post cath.    Signed, Lauree Chandler, MD 08/29/2019 2:53 PM    [facility]  [address] [place],   [address] Phone: [phone]; Fax: [phone]

## 2019-08-28 NOTE — Telephone Encounter (Signed)
Ok refer to wound clinic same he's used in the past

## 2019-08-29 ENCOUNTER — Inpatient Hospital Stay (HOSPITAL_COMMUNITY)
Admission: EM | Admit: 2019-08-29 | Discharge: 2019-09-04 | DRG: 243 | Disposition: A | Discharge status: Home / Self Care | Payer: Medicare HMO | Attending: Cardiovascular Disease | Admitting: Cardiovascular Disease

## 2019-08-29 ENCOUNTER — Encounter: Payer: Self-pay | Admitting: Cardiovascular Disease

## 2019-08-29 ENCOUNTER — Ambulatory Visit (INDEPENDENT_AMBULATORY_CARE_PROVIDER_SITE_OTHER): Payer: Medicare HMO | Admitting: Cardiovascular Disease

## 2019-08-29 ENCOUNTER — Other Ambulatory Visit: Payer: Self-pay

## 2019-08-29 ENCOUNTER — Emergency Department (HOSPITAL_COMMUNITY): Payer: Medicare HMO

## 2019-08-29 ENCOUNTER — Encounter (HOSPITAL_COMMUNITY): Payer: Self-pay

## 2019-08-29 ENCOUNTER — Encounter (HOSPITAL_COMMUNITY)
Admission: EM | Disposition: A | Discharge status: Home / Self Care | Payer: Self-pay | Source: Home / Self Care | Attending: Cardiovascular Disease

## 2019-08-29 VITALS — BP 118/74 | HR 100 | Ht 69.0 in | Wt 180.0 lb

## 2019-08-29 DIAGNOSIS — L7632 Postprocedural hematoma of skin and subcutaneous tissue following other procedure: Secondary | ICD-10-CM | POA: Diagnosis not present

## 2019-08-29 DIAGNOSIS — I5032 Chronic diastolic (congestive) heart failure: Secondary | ICD-10-CM

## 2019-08-29 DIAGNOSIS — N179 Acute kidney failure, unspecified: Secondary | ICD-10-CM | POA: Diagnosis not present

## 2019-08-29 DIAGNOSIS — Z20828 Contact with and (suspected) exposure to other viral communicable diseases: Secondary | ICD-10-CM | POA: Diagnosis present

## 2019-08-29 DIAGNOSIS — D631 Anemia in chronic kidney disease: Secondary | ICD-10-CM | POA: Diagnosis present

## 2019-08-29 DIAGNOSIS — Z79899 Other long term (current) drug therapy: Secondary | ICD-10-CM | POA: Diagnosis not present

## 2019-08-29 DIAGNOSIS — I447 Left bundle-branch block, unspecified: Secondary | ICD-10-CM | POA: Diagnosis present

## 2019-08-29 DIAGNOSIS — Z833 Family history of diabetes mellitus: Secondary | ICD-10-CM

## 2019-08-29 DIAGNOSIS — Z89512 Acquired absence of left leg below knee: Secondary | ICD-10-CM

## 2019-08-29 DIAGNOSIS — I350 Nonrheumatic aortic (valve) stenosis: Secondary | ICD-10-CM

## 2019-08-29 DIAGNOSIS — Z993 Dependence on wheelchair: Secondary | ICD-10-CM

## 2019-08-29 DIAGNOSIS — I200 Unstable angina: Secondary | ICD-10-CM | POA: Diagnosis present

## 2019-08-29 DIAGNOSIS — Z951 Presence of aortocoronary bypass graft: Secondary | ICD-10-CM

## 2019-08-29 DIAGNOSIS — E1129 Type 2 diabetes mellitus with other diabetic kidney complication: Secondary | ICD-10-CM

## 2019-08-29 DIAGNOSIS — Z882 Allergy status to sulfonamides status: Secondary | ICD-10-CM

## 2019-08-29 DIAGNOSIS — Z8249 Family history of ischemic heart disease and other diseases of the circulatory system: Secondary | ICD-10-CM

## 2019-08-29 DIAGNOSIS — E785 Hyperlipidemia, unspecified: Secondary | ICD-10-CM | POA: Diagnosis present

## 2019-08-29 DIAGNOSIS — M069 Rheumatoid arthritis, unspecified: Secondary | ICD-10-CM | POA: Diagnosis present

## 2019-08-29 DIAGNOSIS — Z96642 Presence of left artificial hip joint: Secondary | ICD-10-CM | POA: Diagnosis present

## 2019-08-29 DIAGNOSIS — I5042 Chronic combined systolic (congestive) and diastolic (congestive) heart failure: Secondary | ICD-10-CM | POA: Diagnosis present

## 2019-08-29 DIAGNOSIS — Z23 Encounter for immunization: Secondary | ICD-10-CM

## 2019-08-29 DIAGNOSIS — R7989 Other specified abnormal findings of blood chemistry: Secondary | ICD-10-CM | POA: Diagnosis not present

## 2019-08-29 DIAGNOSIS — Z7952 Long term (current) use of systemic steroids: Secondary | ICD-10-CM

## 2019-08-29 DIAGNOSIS — I255 Ischemic cardiomyopathy: Secondary | ICD-10-CM | POA: Diagnosis present

## 2019-08-29 DIAGNOSIS — N183 Chronic kidney disease, stage 3 (moderate): Secondary | ICD-10-CM | POA: Diagnosis not present

## 2019-08-29 DIAGNOSIS — R69 Illness, unspecified: Secondary | ICD-10-CM

## 2019-08-29 DIAGNOSIS — I10 Essential (primary) hypertension: Secondary | ICD-10-CM | POA: Diagnosis present

## 2019-08-29 DIAGNOSIS — I083 Combined rheumatic disorders of mitral, aortic and tricuspid valves: Secondary | ICD-10-CM | POA: Diagnosis present

## 2019-08-29 DIAGNOSIS — I130 Hypertensive heart and chronic kidney disease with heart failure and stage 1 through stage 4 chronic kidney disease, or unspecified chronic kidney disease: Secondary | ICD-10-CM | POA: Diagnosis present

## 2019-08-29 DIAGNOSIS — I442 Atrioventricular block, complete: Secondary | ICD-10-CM | POA: Diagnosis not present

## 2019-08-29 DIAGNOSIS — I25110 Atherosclerotic heart disease of native coronary artery with unstable angina pectoris: Secondary | ICD-10-CM | POA: Diagnosis not present

## 2019-08-29 DIAGNOSIS — Y831 Surgical operation with implant of artificial internal device as the cause of abnormal reaction of the patient, or of later complication, without mention of misadventure at the time of the procedure: Secondary | ICD-10-CM | POA: Diagnosis not present

## 2019-08-29 DIAGNOSIS — Z950 Presence of cardiac pacemaker: Secondary | ICD-10-CM | POA: Diagnosis not present

## 2019-08-29 DIAGNOSIS — Z823 Family history of stroke: Secondary | ICD-10-CM | POA: Diagnosis not present

## 2019-08-29 DIAGNOSIS — L8990 Pressure ulcer of unspecified site, unspecified stage: Secondary | ICD-10-CM | POA: Insufficient documentation

## 2019-08-29 DIAGNOSIS — E1151 Type 2 diabetes mellitus with diabetic peripheral angiopathy without gangrene: Secondary | ICD-10-CM | POA: Diagnosis present

## 2019-08-29 DIAGNOSIS — I25700 Atherosclerosis of coronary artery bypass graft(s), unspecified, with unstable angina pectoris: Secondary | ICD-10-CM | POA: Diagnosis present

## 2019-08-29 DIAGNOSIS — E1122 Type 2 diabetes mellitus with diabetic chronic kidney disease: Secondary | ICD-10-CM | POA: Diagnosis present

## 2019-08-29 DIAGNOSIS — E875 Hyperkalemia: Secondary | ICD-10-CM | POA: Diagnosis present

## 2019-08-29 DIAGNOSIS — Z794 Long term (current) use of insulin: Secondary | ICD-10-CM | POA: Diagnosis not present

## 2019-08-29 DIAGNOSIS — I2510 Atherosclerotic heart disease of native coronary artery without angina pectoris: Secondary | ICD-10-CM | POA: Diagnosis not present

## 2019-08-29 DIAGNOSIS — Z7982 Long term (current) use of aspirin: Secondary | ICD-10-CM | POA: Diagnosis not present

## 2019-08-29 DIAGNOSIS — E1140 Type 2 diabetes mellitus with diabetic neuropathy, unspecified: Secondary | ICD-10-CM | POA: Diagnosis present

## 2019-08-29 DIAGNOSIS — R079 Chest pain, unspecified: Secondary | ICD-10-CM | POA: Diagnosis not present

## 2019-08-29 HISTORY — DX: Atrioventricular block, complete: I44.2

## 2019-08-29 HISTORY — PX: TEMPORARY PACEMAKER: CATH118268

## 2019-08-29 LAB — CBC
HCT: 34.5 % — ABNORMAL LOW (ref 39.0–52.0)
HCT: 30.1 % — ABNORMAL LOW (ref 39.0–52.0)
Hemoglobin: 10.6 g/dL — ABNORMAL LOW (ref 13.0–17.0)
Hemoglobin: 9.5 g/dL — ABNORMAL LOW (ref 13.0–17.0)
MCH: 30 pg (ref 26.0–34.0)
MCH: 30.6 pg (ref 26.0–34.0)
MCHC: 30.7 g/dL (ref 30.0–36.0)
MCHC: 31.6 g/dL (ref 30.0–36.0)
MCV: 97.7 fL (ref 80.0–100.0)
MCV: 97.1 fL (ref 80.0–100.0)
Platelets: 138 10*3/uL — ABNORMAL LOW (ref 150–400)
Platelets: 113 10*3/uL — ABNORMAL LOW (ref 150–400)
RBC: 3.53 MIL/uL — ABNORMAL LOW (ref 4.22–5.81)
RBC: 3.1 MIL/uL — ABNORMAL LOW (ref 4.22–5.81)
RDW: 14 % (ref 11.5–15.5)
RDW: 14.1 % (ref 11.5–15.5)
WBC: 12 10*3/uL — ABNORMAL HIGH (ref 4.0–10.5)
WBC: 9.9 10*3/uL (ref 4.0–10.5)
nRBC: 0 % (ref 0.0–0.2)
nRBC: 0 % (ref 0.0–0.2)

## 2019-08-29 LAB — BASIC METABOLIC PANEL
Anion gap: 14 (ref 5–15)
BUN: 44 mg/dL — ABNORMAL HIGH (ref 8–23)
CO2: 20 mmol/L — ABNORMAL LOW (ref 22–32)
Calcium: 9.6 mg/dL (ref 8.9–10.3)
Chloride: 99 mmol/L (ref 98–111)
Creatinine, Ser: 2.77 mg/dL — ABNORMAL HIGH (ref 0.61–1.24)
GFR calc Af Amer: 26 mL/min — ABNORMAL LOW (ref >60)
GFR calc non Af Amer: 22 mL/min — ABNORMAL LOW (ref >60)
Glucose, Bld: 249 mg/dL — ABNORMAL HIGH (ref 70–99)
Potassium: 5.6 mmol/L — ABNORMAL HIGH (ref 3.5–5.1)
Sodium: 133 mmol/L — ABNORMAL LOW (ref 135–145)

## 2019-08-29 LAB — CREATININE, SERUM
Creatinine, Ser: 2.91 mg/dL — ABNORMAL HIGH (ref 0.61–1.24)
GFR calc Af Amer: 24 mL/min — ABNORMAL LOW (ref >60)
GFR calc non Af Amer: 21 mL/min — ABNORMAL LOW (ref >60)

## 2019-08-29 LAB — HEMOGLOBIN A1C
Hgb A1c MFr Bld: 6.5 % — ABNORMAL HIGH (ref 4.8–5.6)
Mean Plasma Glucose: 139.85 mg/dL

## 2019-08-29 LAB — TROPONIN I (HIGH SENSITIVITY)
Troponin I (High Sensitivity): 505 ng/L (ref <18)
Troponin I (High Sensitivity): 544 ng/L (ref <18)
Troponin I (High Sensitivity): 528 ng/L (ref <18)
Troponin I (High Sensitivity): 427 ng/L (ref <18)

## 2019-08-29 LAB — SARS CORONAVIRUS 2 (TAT 6-24 HRS): SARS Coronavirus 2: NEGATIVE

## 2019-08-29 LAB — GLUCOSE, CAPILLARY: Glucose-Capillary: 242 mg/dL — ABNORMAL HIGH (ref 70–99)

## 2019-08-29 SURGERY — TEMPORARY PACEMAKER
Anesthesia: LOCAL

## 2019-08-29 MED ORDER — ENOXAPARIN SODIUM 40 MG/0.4ML ~~LOC~~ SOLN: 40.0000 mg | SUBCUTANEOUS | Status: DC

## 2019-08-29 MED ORDER — POLYETHYLENE GLYCOL 3350 17 G PO PACK
17.0000 g | PACK | Freq: Two times a day (BID) | ORAL | Status: DC
  Administered 2019-08-30 – 2019-09-04 (×10): 17 g via ORAL
  Filled 2019-08-29 (×11): qty 1

## 2019-08-29 MED ORDER — DULOXETINE HCL 30 MG PO CPEP
30.0000 mg | ORAL_CAPSULE | Freq: Every day | ORAL | Status: DC
  Administered 2019-08-30 – 2019-09-04 (×6): 30 mg via ORAL
  Filled 2019-08-29 (×6): qty 1

## 2019-08-29 MED ORDER — NITROGLYCERIN 0.4 MG SL SUBL: 0.4000 mg | SUBLINGUAL_TABLET | SUBLINGUAL | Status: DC | PRN

## 2019-08-29 MED ORDER — HEPARIN (PORCINE) IN NACL 1000-0.9 UT/500ML-% IV SOLN
INTRAVENOUS | Status: AC
  Filled 2019-08-29: qty 500

## 2019-08-29 MED ORDER — TAMSULOSIN HCL 0.4 MG PO CAPS
0.4000 mg | ORAL_CAPSULE | Freq: Every day | ORAL | Status: DC
  Administered 2019-08-29 – 2019-09-03 (×6): 0.4 mg via ORAL
  Filled 2019-08-29 (×6): qty 1

## 2019-08-29 MED ORDER — PREDNISONE 5 MG PO TABS
5.0000 mg | ORAL_TABLET | Freq: Every day | ORAL | Status: DC
  Administered 2019-08-30 – 2019-09-04 (×5): 5 mg via ORAL
  Filled 2019-08-29 (×5): qty 1

## 2019-08-29 MED ORDER — ASPIRIN EC 81 MG PO TBEC
81.0000 mg | DELAYED_RELEASE_TABLET | Freq: Every day | ORAL | Status: DC
  Administered 2019-08-30 – 2019-09-04 (×5): 81 mg via ORAL
  Filled 2019-08-29 (×5): qty 1

## 2019-08-29 MED ORDER — CHLORHEXIDINE GLUCONATE CLOTH 2 % EX PADS
6.0000 | MEDICATED_PAD | Freq: Every day | CUTANEOUS | Status: DC
  Administered 2019-08-29 – 2019-09-04 (×5): 6 via TOPICAL

## 2019-08-29 MED ORDER — AMITRIPTYLINE HCL 75 MG PO TABS
75.0000 mg | ORAL_TABLET | Freq: Every day | ORAL | Status: DC
  Filled 2019-08-29: qty 1

## 2019-08-29 MED ORDER — HEPARIN (PORCINE) 25000 UT/250ML-% IV SOLN
950.0000 [IU]/h | INTRAVENOUS | Status: DC
  Administered 2019-08-30: 02:00:00 950 [IU]/h via INTRAVENOUS
  Filled 2019-08-29: qty 250

## 2019-08-29 MED ORDER — AMITRIPTYLINE HCL 25 MG PO TABS
75.0000 mg | ORAL_TABLET | Freq: Every day | ORAL | Status: DC
  Administered 2019-08-29 – 2019-09-03 (×5): 75 mg via ORAL
  Filled 2019-08-29 (×10): qty 3

## 2019-08-29 MED ORDER — INSULIN ASPART 100 UNIT/ML ~~LOC~~ SOLN
0.0000 [IU] | Freq: Every day | SUBCUTANEOUS | Status: DC
  Administered 2019-08-29: 2 [IU] via SUBCUTANEOUS
  Administered 2019-08-30: 4 [IU] via SUBCUTANEOUS
  Administered 2019-08-31: 23:00:00 2 [IU] via SUBCUTANEOUS

## 2019-08-29 MED ORDER — HEPARIN BOLUS VIA INFUSION
4000.0000 [IU] | Freq: Once | INTRAVENOUS | Status: DC
  Filled 2019-08-29: qty 4000

## 2019-08-29 MED ORDER — HEPARIN (PORCINE) 25000 UT/250ML-% IV SOLN: 950.0000 [IU]/h | INTRAVENOUS | Status: DC

## 2019-08-29 MED ORDER — CLONAZEPAM 0.25 MG PO TBDP: 0.2500 mg | ORAL_TABLET | Freq: Every day | ORAL | Status: DC | PRN

## 2019-08-29 MED ORDER — SODIUM CHLORIDE 0.9 % IV SOLN
INTRAVENOUS | Status: AC | PRN
  Administered 2019-08-29: 75 mL/h via INTRAVENOUS

## 2019-08-29 MED ORDER — SODIUM CHLORIDE 0.9% FLUSH
3.0000 mL | Freq: Two times a day (BID) | INTRAVENOUS | Status: DC
  Administered 2019-08-29 – 2019-09-04 (×9): 3 mL via INTRAVENOUS

## 2019-08-29 MED ORDER — LIDOCAINE HCL (PF) 1 % IJ SOLN
INTRAMUSCULAR | Status: AC
  Filled 2019-08-29: qty 30

## 2019-08-29 MED ORDER — ATORVASTATIN CALCIUM 80 MG PO TABS
80.0000 mg | ORAL_TABLET | Freq: Every day | ORAL | Status: DC
  Administered 2019-08-30 – 2019-09-03 (×5): 80 mg via ORAL
  Filled 2019-08-29 (×5): qty 1

## 2019-08-29 MED ORDER — ONDANSETRON HCL 4 MG/2ML IJ SOLN: 4.0000 mg | Freq: Four times a day (QID) | INTRAMUSCULAR | Status: DC | PRN

## 2019-08-29 MED ORDER — INSULIN ASPART 100 UNIT/ML ~~LOC~~ SOLN
0.0000 [IU] | Freq: Three times a day (TID) | SUBCUTANEOUS | Status: DC
  Administered 2019-08-30 – 2019-08-31 (×2): 3 [IU] via SUBCUTANEOUS
  Administered 2019-08-31 (×2): 5 [IU] via SUBCUTANEOUS
  Administered 2019-09-01 (×2): 3 [IU] via SUBCUTANEOUS
  Administered 2019-09-01: 2 [IU] via SUBCUTANEOUS
  Administered 2019-09-02: 12:00:00 3 [IU] via SUBCUTANEOUS
  Administered 2019-09-02: 2 [IU] via SUBCUTANEOUS
  Administered 2019-09-02: 18:00:00 5 [IU] via SUBCUTANEOUS
  Administered 2019-09-03: 2 [IU] via SUBCUTANEOUS
  Administered 2019-09-03: 17:00:00 3 [IU] via SUBCUTANEOUS
  Administered 2019-09-04: 07:00:00 2 [IU] via SUBCUTANEOUS
  Administered 2019-09-04: 3 [IU] via SUBCUTANEOUS

## 2019-08-29 MED ORDER — SODIUM CHLORIDE 0.9% FLUSH
3.0000 mL | Freq: Once | INTRAVENOUS | Status: AC
  Administered 2019-08-30: 3 mL via INTRAVENOUS

## 2019-08-29 MED ORDER — HEPARIN (PORCINE) IN NACL 1000-0.9 UT/500ML-% IV SOLN
INTRAVENOUS | Status: DC | PRN
  Administered 2019-08-29: 500 mL

## 2019-08-29 MED ORDER — DOCUSATE SODIUM 100 MG PO CAPS: 100.0000 mg | ORAL_CAPSULE | Freq: Three times a day (TID) | ORAL | Status: DC

## 2019-08-29 MED ORDER — CEPHALEXIN 500 MG PO CAPS
500.0000 mg | ORAL_CAPSULE | Freq: Three times a day (TID) | ORAL | Status: DC
  Filled 2019-08-29: qty 1

## 2019-08-29 MED ORDER — METHADONE HCL 5 MG PO TABS
10.0000 mg | ORAL_TABLET | Freq: Four times a day (QID) | ORAL | Status: DC
  Administered 2019-08-29 – 2019-09-04 (×22): 10 mg via ORAL
  Filled 2019-08-29 (×22): qty 2

## 2019-08-29 MED ORDER — PREGABALIN 25 MG PO CAPS
25.0000 mg | ORAL_CAPSULE | Freq: Every day | ORAL | Status: DC
  Administered 2019-08-29 – 2019-09-03 (×6): 25 mg via ORAL
  Filled 2019-08-29 (×6): qty 1

## 2019-08-29 MED ORDER — HYDROXYCHLOROQUINE SULFATE 200 MG PO TABS
200.0000 mg | ORAL_TABLET | Freq: Every day | ORAL | Status: DC
  Administered 2019-08-30 – 2019-09-04 (×6): 200 mg via ORAL
  Filled 2019-08-29 (×7): qty 1

## 2019-08-29 MED ORDER — FINASTERIDE 5 MG PO TABS
5.0000 mg | ORAL_TABLET | Freq: Every day | ORAL | Status: DC
  Administered 2019-08-30 – 2019-09-04 (×6): 5 mg via ORAL
  Filled 2019-08-29 (×6): qty 1

## 2019-08-29 MED ORDER — SODIUM CHLORIDE 0.9 % IV SOLN: INTRAVENOUS | Status: DC | PRN

## 2019-08-29 MED ORDER — POLYETHYLENE GLYCOL 3350 17 GM/SCOOP PO POWD: 17.0000 g | Freq: Two times a day (BID) | ORAL | Status: DC

## 2019-08-29 MED ORDER — METHADONE HCL 10 MG PO TABS: 10.0000 mg | ORAL_TABLET | Freq: Four times a day (QID) | ORAL | Status: DC

## 2019-08-29 MED ORDER — DOCUSATE SODIUM 100 MG PO CAPS
100.0000 mg | ORAL_CAPSULE | Freq: Three times a day (TID) | ORAL | Status: DC
  Administered 2019-08-29 – 2019-09-04 (×17): 100 mg via ORAL
  Filled 2019-08-29 (×17): qty 1

## 2019-08-29 MED ORDER — TORSEMIDE 20 MG PO TABS
20.0000 mg | ORAL_TABLET | ORAL | Status: DC
  Administered 2019-08-30 – 2019-09-04 (×4): 20 mg via ORAL
  Filled 2019-08-29 (×5): qty 1

## 2019-08-29 MED ORDER — CEPHALEXIN 500 MG PO CAPS
500.0000 mg | ORAL_CAPSULE | Freq: Three times a day (TID) | ORAL | Status: DC
  Administered 2019-08-29 – 2019-08-30 (×2): 500 mg via ORAL
  Filled 2019-08-29 (×5): qty 1

## 2019-08-29 MED ORDER — SODIUM CHLORIDE 0.9 % IV SOLN
INTRAVENOUS | Status: AC | PRN
  Administered 2019-08-29: 10 mL/h via INTRAVENOUS

## 2019-08-29 MED ORDER — ATORVASTATIN CALCIUM 80 MG PO TABS: 80.0000 mg | ORAL_TABLET | Freq: Every day | ORAL | Status: DC

## 2019-08-29 MED ORDER — ACETAMINOPHEN 325 MG PO TABS: 650.0000 mg | ORAL_TABLET | ORAL | Status: DC | PRN

## 2019-08-29 MED ORDER — LIDOCAINE HCL (PF) 1 % IJ SOLN
INTRAMUSCULAR | Status: DC | PRN
  Administered 2019-08-29: 16 mL via INTRADERMAL

## 2019-08-29 MED ORDER — METHADONE HCL 5 MG PO TABS: 5.0000 mg | ORAL_TABLET | Freq: Three times a day (TID) | ORAL | Status: DC | PRN

## 2019-08-29 SURGICAL SUPPLY — 5 items
CABLE ADAPT PACING TEMP 12FT (ADAPTER) ×1 IMPLANT
CATH S G BIP PACING (CATHETERS) ×1 IMPLANT
SHEATH BRITE TIP 7FR 35CM (SHEATH) ×1 IMPLANT
SLEEVE REPOSITIONING LENGTH 30 (MISCELLANEOUS) ×1 IMPLANT
WIRE EMERALD 3MM-J .035X150CM (WIRE) ×1 IMPLANT

## 2019-08-29 NOTE — Progress Notes (Signed)
   Patient admitted from the office today given concerns for unstable angina with plans for [facility] tomorrow to evaluate AS and coronary anatomy. RN called to report patient was in third degree AV block. EKG and telemetry reviewed confirming third degree AV block with rates in the 30s which is new for this patient. He is currently without complaints of chest pain, dizziness, lightheadedness, or syncope. He did have some chest pain and lightheadedness last night which resolved. He is not on any AV nodal blocking agents.  Seems somewhat confused with history taking. He is oriented to self, place, and year, but unable to state the month. He is bradycardic on exam but otherwise benign cardiopulmonary exam.   BP is elevated. His admission labs reveal mild hyperkalemia with K 5.6, Na 133, Cr 2.77 (up from 1.65 06/2019), Hgb 9.5 (baseline), PLT 138, Trop 505>544.  Case discussed with Drs. [place] and [place]. Plan for temporary pacer wire this evening with transfer to [facility] for ongoing care. Risks/benefits of the procedure were discussed with the patient and his wife, [name], at bedside and patient/wife in agreement to proceed. Will need to medically optimize patient prior to [facility] given AoCKD, electrolyte imbalance, and new development of CHB.     Abigail Butts, PA-C 08/29/19; 6:36 PM

## 2019-08-29 NOTE — Progress Notes (Addendum)
Pt arrived from Cath Lab with TVP on RA wearing face mask without event.

## 2019-08-29 NOTE — H&P (Signed)
Cardiology Admission History and Physical  History of Present Illness: 68 yo male with history of chronic combined systolic and diastolic CHF, ischemic cardiomyopathy, CAD s/p 3V CABG in 2016, DM, HTN, post-operative atrial fibrillation, stage 3 chronic kidney disease, hyperlipidemia, PAD s/p left below knee amputation, rheumatoid arthritis and chronic neck pain and neuropathy with recent finding of severe aortic stenosis. He is here today as a new consult in the structural heart clinic for further discussion regarding his aortic stenosis and possible TAVR. He is followed in our office by Dr. [name]. He was admitted to [facility] July 01, 2019 with chest pain and found to have hypotensive and bradycardic. Troponin was mildly elevated. He was fluid resuscitated. Echo 07/02/19 with LVEF=60-65%. The aortic valve leaflets are thickened and calcified with limited leaflets mobility. The mean gradient is 16.8 mmHg, peak gradient 27.5 mmHg, AVA 0.79 cm2, dimensionless index 0.29. He had acute renal failure during this admission felt to be due to dehydration. Nuclear stress test July 2020 with an inferior wall perfusion defect not felt to be due to ischemia.   He was seen for follow up by virtual visit 07/31/19 by Bernerd Pho, PA-C in our [facility] office in [place], [place]. He reported no dyspnea or chest pain. He is wheelchair bound. He denied weight gain or lower extremity edema.   He tells me today that he has been having severe chest pain over the past 48 hours. This is described as a burning sensation in the center of his chest pain. Worsened dyspnea. No edema in right lower extremity. He has been feeling weak. He lives with his wife in [place], [place]. He has full dentures. He is retired from [employer]. He is having no active chest pain at this time.   Primary Care Physician: [name], MD Primary Cardiologist: Carlyle Dolly Referring Cardiologist: Carlyle Dolly      Past Medical  History:  Diagnosis Date  . Aortic stenosis   . Chronic back pain   . Chronic combined systolic (congestive) and diastolic (congestive) heart failure (HCC)    a. EF 30-35% in 2016 b. at 30-35% by echo in 08/2016 c. 06/2019: echo showing EF of 60-65% but found to have severe AS.   [name] Chronic neck pain   . Coronary artery disease    a. s/p CABG in 06/2015 with LIMA-LAD, SVG-PDA, SVG-Intermediate  . Diabetes mellitus   . Diabetic neuropathy ([place])   . Hyperkalemia   . Hypertension   . Loculated pleural effusion 09/09/2016  . Pancytopenia ([place]) 08/13/2014  . 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  . Rheumatoid arthritis(714.0)   . Seizure ([place]) 08/13/2014   Pt denies         Past Surgical History:  Procedure Laterality Date  . 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 CARDIAC SURGERY    . FRACTURE SURGERY    . MUSCLE BIOPSY  06/2016  . right foot surgery-Toe amputations    . TOTAL HIP ARTHROPLASTY Left           Current Outpatient Medications  Medication Sig Dispense Refill  . amitriptyline (ELAVIL) 50 MG tablet TAKE 1 AND 1/2 TABLETS BY MOUTH AT BEDTIME. (Patient taking differently: Take 75 mg by mouth at bedtime. ) 45 tablet 5  . aspirin EC 81 MG tablet Take 81 mg by mouth daily.    [name] atorvastatin (LIPITOR) 80 MG tablet TAKE ONE TABLET BY MOUTH ONCE DAILY. (Patient taking differently:  Take 80 mg by mouth daily. ) 30 tablet 5  . blood glucose meter kit and supplies KIT Dispense based on patient and insurance preference. Use up to 3 times daily as directed. DX:E11.9 1 each 0  . cephALEXin (KEFLEX) 500 MG capsule Take 1 capsule (500 mg total) by mouth 3 (three) times daily. 30 capsule 0  . Cholecalciferol (VITAMIN D3) 25 MCG (1000 UT) CHEW Chew 1 tablet by mouth daily.    . clonazePAM (KLONOPIN) 0.25 MG disintegrating tablet DISSOLVE 1 TABLET BY MOUTH DAILY AS NEEDED FOR ANXIETY. 20 tablet 1  .  docusate sodium (COLACE) 100 MG capsule Take 100 mg by mouth 3 (three) times daily.     [name] doxycycline (VIBRA-TABS) 100 MG tablet Take 1 tablet (100 mg total) by mouth 2 (two) times daily. 20 tablet 0  . DULoxetine (CYMBALTA) 30 MG capsule Take 30 mg by mouth daily.     . finasteride (PROSCAR) 5 MG tablet Take 5 mg by mouth daily.     . hydroxychloroquine (PLAQUENIL) 200 MG tablet Take 200 mg by mouth daily.     . insulin aspart protamine- aspart (NOVOLOG MIX 70/30) (70-30) 100 UNIT/ML injection Inject 12 units into skin in the morning  and 8 units evening (Patient taking differently: Inject 8-12 Units into the skin See admin instructions. Inject 12 units subcutaneously after breakfast and 8 units after supper) 10 mL 5  . methadone (DOLOPHINE) 10 MG tablet Take 10 mg by mouth every 6 (six) hours.     . methadone (DOLOPHINE) 5 MG tablet Take 5 mg by mouth 3 (three) times daily as needed (breakthrough pain).     . polyethylene glycol powder (MIRALAX) powder Take 17 g by mouth 2 (two) times daily. 255 g   . Polyvinyl Alcohol-Povidone (REFRESH OP) Place 1 drop into both eyes daily as needed (dry eyes).    . predniSONE (DELTASONE) 5 MG tablet TAKE 1 TABLET BY MOUTH ONCE A DAY WITH BREAKFAST. (Patient taking differently: Take 5 mg by mouth daily with breakfast. ) 30 tablet 5  . pregabalin (LYRICA) 25 MG capsule TAKE ONE CAPSULE BY MOUTH AT BEDTIME. (Patient taking differently: Take 25 mg by mouth at bedtime. ) 30 capsule 5  . tamsulosin (FLOMAX) 0.4 MG CAPS capsule Take 1 capsule (0.4 mg total) by mouth at bedtime. 30 capsule 5  . torsemide (DEMADEX) 10 MG tablet TAKE 2 TABLETS BY MOUTH EVERY OTHER DAY. (Patient taking differently: Take 20 mg by mouth every other day. ) 30 tablet 5   No current facility-administered medications for this visit.         Allergies  Allergen Reactions  . Sulfa Antibiotics Rash    Social History        Socioeconomic History  . Marital status:  Married    Spouse name: Not on file  . Number of children: 2  . Years of education: Not on file  . Highest education level: Not on file  Occupational History  . Occupation: Retired Barista  . Financial resource strain: Not on file  . Food insecurity    Worry: Not on file    Inability: Not on file  . Transportation needs    Medical: Not on file    Non-medical: Not on file  Tobacco Use  . Smoking status: Never Smoker  . Smokeless tobacco: Never Used  Substance and Sexual Activity  . Alcohol use: No  . Drug use: No  . Sexual activity: Not on  file  Lifestyle  . Physical activity    Days per week: Not on file    Minutes per session: Not on file  . Stress: Not on file  Relationships  . Social Herbalist on phone: Not on file    Gets together: Not on file    Attends religious service: Not on file    Active member of club or organization: Not on file    Attends meetings of clubs or organizations: Not on file    Relationship status: Not on file  . Intimate partner violence    Fear of current or ex partner: Not on file    Emotionally abused: Not on file    Physically abused: Not on file    Forced sexual activity: Not on file  Other Topics Concern  . Not on file  Social History Narrative  . Not on file         Family History  Problem Relation Age of Onset  . Diabetes Father   . Dementia Father   . Heart attack Brother        multiple brothers with MIs  . Hypertension Mother   . Stroke Mother     Review of Systems:  As stated in the HPI and otherwise negative.   BP 118/74   Pulse 100   Ht '5\' 9"'  (1.753 m)   Wt 180 lb (81.6 kg)   SpO2 95%   BMI 26.58 kg/m   Physical Examination: General: Well developed, well nourished, NAD  HEENT: OP clear, mucus membranes moist  SKIN: warm, dry. No rashes. Neuro: No focal deficits  Musculoskeletal: Muscle strength 5/5 all ext  Psychiatric: Mood and  affect normal  Neck: No JVD, no carotid bruits, no thyromegaly, no lymphadenopathy.  Lungs:Clear bilaterally, no wheezes, rhonci, crackles Cardiovascular: Regular rate and rhythm. Loud, harsh, late peaking systolic murmur.  Abdomen:Soft. Bowel sounds present. Non-tender.  Extremities: No right lower extremity edema. Left BKA.     Echo 07/02/19: 1. The left ventricle has normal systolic function with an ejection fraction of 60-65%. The cavity size was normal. Left ventricular diastolic Doppler parameters are consistent with pseudonormalization. 2. The right ventricle has normal systolic function. The cavity was normal. There is no increase in right ventricular wall thickness. 3. The mitral valve is abnormal. Mild thickening of the mitral valve leaflet. There is moderate mitral annular calcification present. 4. The aortic valve is abnormal. Severely thickening of the aortic valve. Severe calcifcation of the aortic valve. Aortic valve regurgitation is mild to moderate by color flow Doppler. Severe stenosis of the aortic valve. 5. The aortic root and ascending aorta are normal in size and structure. 6. The interatrial septum was not assessed.  FINDINGS Left Ventricle: The left ventricle has normal systolic function, with an ejection fraction of 60-65%. The cavity size was normal. There is no increase in left ventricular wall thickness. Left ventricular diastolic Doppler parameters are consistent with  pseudonormalization. Definity contrast agent was given IV to delineate the left ventricular endocardial borders.  Right Ventricle: The right ventricle has normal systolic function. The cavity was normal. There is no increase in right ventricular wall thickness.  Left Atrium: Left atrial size was normal in size.  Right Atrium: Right atrial size was normal in size. Right atrial pressure is estimated at 10 mmHg.  Interatrial Septum: The interatrial septum was not assessed.  Pericardium:  There is no evidence of pericardial effusion.  Mitral Valve: The mitral valve is abnormal.  Mild thickening of the mitral valve leaflet. There is moderate mitral annular calcification present. Mitral valve regurgitation is mild by color flow Doppler.  Tricuspid Valve: The tricuspid valve is normal in structure. Tricuspid valve regurgitation is mild by color flow Doppler.  Aortic Valve: The aortic valve is abnormal Severely thickening of the aortic valve. Severe calcifcation of the aortic valve. Aortic valve regurgitation is mild to moderate by color flow Doppler. There is Severe stenosis of the aortic valve, with a  calculated valve area of 0.92 cm.  Pulmonic Valve: The pulmonic valve was grossly normal. Pulmonic valve regurgitation is not visualized by color flow Doppler.  Aorta: The aortic root and ascending aorta are normal in size and structure.   +--------------+--------++ LEFT VENTRICLE  +----------------+---------++ +--------------+--------++ Diastology   PLAX 2D   +----------------+---------++ +--------------+--------++ LV e' lateral: 9.03 cm/s LVIDd: 4.70 cm  +----------------+---------++ +--------------+--------++ LV E/e' lateral:9.6  LVIDs: 3.20 cm  +----------------+---------++ +--------------+--------++ LV e' medial: 5.55 cm/s LV PW: 1.10 cm  +----------------+---------++ +--------------+--------++ LV E/e' medial: 15.7  LV IVS: 1.10 cm  +----------------+---------++ +--------------+--------++ LVOT diam: 2.00 cm  +--------------+--------++ LV SV: 61 ml  +--------------+--------++ LV SV Index: 31.08  +--------------+--------++ LVOT Area: 3.14 cm +--------------+--------++    +--------------+--------++  +---------------+-------++-----------++ LEFT ATRIUM  Index   +---------------+-------++-----------++ LA diam: 3.10 cm1.57 cm/m  +---------------+-------++-----------++ LA Vol (A2C): 64.8 ml32.84 ml/m +---------------+-------++-----------++ LA Vol (A4C): 55.3 ml28.03 ml/m +---------------+-------++-----------++ LA Biplane Vol:60.6 ml30.71 ml/m +---------------+-------++-----------++ +------------+---------++-----------++ RIGHT ATRIUM Index  +------------+---------++-----------++ RA Area: 10.80 cm  +------------+---------++-----------++ RA Volume: 21.40 ml 10.85 ml/m +------------+---------++-----------++ +------------------+------------++ AORTIC VALVE   +------------------+------------++ AV Area (Vmax): 0.79 cm  +------------------+------------++ AV Area (Vmean): 0.83 cm  +------------------+------------++ AV Area (VTI): 0.92 cm  +------------------+------------++ AV Vmax: 262.00 cm/s  +------------------+------------++ AV Vmean: 179.400 cm/s +------------------+------------++ AV VTI: 0.630 m  +------------------+------------++ AV Peak Grad: 27.5 mmHg  +------------------+------------++ AV Mean Grad: 16.8 mmHg  +------------------+------------++ LVOT Vmax: 65.87 cm/s  +------------------+------------++ LVOT Vmean: 47.467 cm/s  +------------------+------------++ LVOT VTI: 0.184 m  +------------------+------------++ LVOT/AV VTI ratio:0.29  +------------------+------------++  +-------------+-------++ AORTA   +-------------+-------++ Ao Root diam:3.00 cm +-------------+-------++  +--------------+----------++ +---------------+-----------++ MITRAL VALVE   TRICUSPID VALVE  +--------------+----------++ +---------------+-----------++ MV  Area (PHT):3.85 cm  TR Peak grad: 10.2 mmHg  +--------------+----------++ +---------------+-----------++ MV Peak grad: 3.7 mmHg  TR Vmax: 170.00 cm/s +--------------+----------++ +---------------+-----------++ MV Mean grad: 2.0 mmHg  +--------------+----------++ +--------------+-------+ MV Vmax: 0.97 m/s  SHUNTS   +--------------+----------++ +--------------+-------+ MV Vmean: 60.0 cm/s  Systemic VTI: 0.18 m  +--------------+----------++ +--------------+-------+ MV VTI: 0.33 m  Systemic Diam:2.00 cm +--------------+----------++ +--------------+-------+ MV PHT: 57.13 msec +--------------+----------++ MV Decel Time:197 msec  +--------------+----------++ +--------------+-----------++ MV E velocity:87.00 cm/s  +--------------+-----------++ MV A velocity:100.00 cm/s +--------------+-----------++ MV E/A ratio: 0.87  +--------------+-----------++   Recent Labs: 10/18/2018: TSH 1.660 07/01/2019: B Natriuretic Peptide 3,872.1 07/03/2019: ALT 327; BUN 27; Creatinine, Ser 1.65; Hemoglobin 8.0; Platelets 91; Potassium 4.0; Sodium 137       Wt Readings from Last 3 Encounters:  08/29/19 180 lb (81.6 kg)  07/03/19 171 lb 11.8 oz (77.9 kg)  05/07/18 175 lb (79.4 kg)     Other studies Reviewed: Additional studies/ records that were reviewed today include: Echo images, office notes, hospital notes.   STS Risk Score:  Risk of Mortality: 2.731% Renal Failure: 6.697% Permanent Stroke: 1.520% Prolonged Ventilation: 9.262% DSW Infection: 0.165% Reoperation: 5.325% Morbidity or Mortality: 17.850% Short Length of Stay: 26.376% Long Length of Stay: 10.727%  Assessment and Plan:   1. CAD s/p CABG with unstable angina: He has  been having chest pain that is concerning for unstable angina over the past 48 hours. He is not having chest pain at this time. He  will need to be admitted. There are no beds at [facility] right now. He will go to the ED and will be admitted by the cardiology team. I will plan to cycle troponin, admit to telemetry. Will only start heparin if he has elevated troponin. Will plan right and left heart catheterization tomorrow. Risks and benefits of cath reviewed with pt. He has been added to the cath schedule. Continue ASA and statin.   Severe Aortic Valve Stenosis: He likely has severe aortic valve stenosis. He is now having chest pain and dyspnea. I suspect we will need to move toward TAVR after his cardiac cath is completed. Will assess valve in the cath lab tomorrow. I have personally reviewed the echo images. The aortic valve is thickened, calcified with limited leaflet mobility but his mean gradient is only 16 mmHg. His LV systolic function is low normal.  I reviewed the TAVR procedure in detail.      Lauree Chandler 08/29/2019 4:33 PM   Please call our on call team for questions tonight.

## 2019-08-29 NOTE — Significant Event (Signed)
Rapid Response Event Note  Overview: Cardiac - HR 30s - CHB  Initial Focused Assessment: Per nurse, patient arrived to 6E, HR in the 30s - CHB. Nurse had already contacted CARDS PA/MD and the patient was going to be taken to Cath Lab for emergent temp wire placement for pacing. Patient was awake, alert, and oriented. He denied CP/SOB and he stated he felt good. Skin quite pale in color and cool to touch, per the patient's wife, his color is usually better than this. BP 122/43 (66), HR 34, 99% on RA. Not in acute distress.   CARDS APP consented patient and wife, patient taken to CCL. Defib pads were on patient when I arrived at 1745.   Interventions: -- Taken to CCL   Plan of Care: -- Taken to CCL   Event Summary:  Call Time 1744 Arrival Time 1745 End Time 1815  Garet Hooton R

## 2019-08-29 NOTE — ED Triage Notes (Signed)
Per spouse pt sent her by cardiologist for mid-sternum chest pain radiating to the right, SOB, dry cough, and nausea starting last night.  Pt recently dc'd here a few days ago for the same. HR 36, 90% RA

## 2019-08-29 NOTE — Progress Notes (Signed)
   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 Was not noted in their note from the H&P. PA up to see patient, rapid response called, consents signed by wife, patient taken to cath lab for pacer wire by RR & Charge RN. Report will be call to [facility] for bed 12.  Saddie Benders RN BSN

## 2019-08-29 NOTE — Patient Instructions (Signed)
Medication Instructions:  Your physician recommends that you continue on your current medications as directed. Please refer to the Current Medication list given to you today.  If you need a refill on your cardiac medications before your next appointment, please call your pharmacy.   Lab work: None If you have labs (blood work) drawn today and your tests are completely normal, you will receive your results only by: [name] MyChart Message (if you have MyChart) OR . A paper copy in the mail If you have any lab test that is abnormal or we need to change your treatment, we will call you to review the results.  Testing/Procedures: None  Follow-Up: Theodosia Quay, RN will contact you after heart catheterization  Any Other Special Instructions Will Be Listed Below (If Applicable).

## 2019-08-29 NOTE — ED Provider Notes (Signed)
[facility] EMERGENCY DEPARTMENT Provider Note   CSN: [id number] Arrival date & time: 08/29/19  1515     History   Chief Complaint Chief Complaint  Patient presents with  . Chest Pain    HPI [name] is a 68 y.o. male.     HPI Patient is sent from outpatient cardiology office by Dr. Angelena Form for admission.  Patient has complex medical history of combined systolic and diastolic CHF, ischemic cardiomyopathy, history of CABG in 2016, diabetes, hypertension, stage III chronic kidney disease and recent diagnosis of severe aortic stenosis.  Patient reports he is been having chest pain that started last night.  He reports it was pretty severe and went on through much of the night.  He denies he is having active chest pain now as he was in the emergency department.  Reports he was feeling short of breath.  He has some cough which she identifies as longstanding and no acute change.  His wife reports she has been taking his temperature and he has not had any fever.  Patient has a amputation on the left lower extremity.  No swelling on the right. Past Medical History:  Diagnosis Date  . Aortic stenosis   . Chronic back pain   . Chronic combined systolic (congestive) and diastolic (congestive) heart failure (HCC)    a. EF 30-35% in 2016 b. at 30-35% by echo in 08/2016 c. 06/2019: echo showing EF of 60-65% but found to have severe AS.   [name] Chronic neck pain   . Coronary artery disease    a. s/p CABG in 06/2015 with LIMA-LAD, SVG-PDA, SVG-Intermediate  . Diabetes mellitus   . Diabetic neuropathy ([place])   . Hyperkalemia   . Hypertension   . Loculated pleural effusion 09/09/2016  . Pancytopenia ([place]) 08/13/2014  . 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  . Rheumatoid arthritis(714.0)   . Seizure ([place]) 08/13/2014   Pt denies    Patient Active Problem List   Diagnosis Date Noted  . Opioid dependence on maintenance agonist  therapy, no symptoms ([place]) 07/01/2019  . Elevated troponin 07/01/2019  . Hypotension 07/01/2019  . LBBB (left bundle branch block) 07/01/2019  . Cardiogenic shock ([place]) 07/01/2019  . Current chronic use of systemic steroids 07/01/2019  . Urinary tract infection without hematuria 10/15/2016  . Sepsis ([place]) 10/15/2016  . Loculated pleural effusion 09/09/2016  . Shortness of breath 09/08/2016  . Acute on chronic combined systolic and diastolic CHF (congestive heart failure) ([place]) 09/02/2016  . Acute renal failure superimposed on stage 3 chronic kidney disease ([place]) 09/02/2016  . HCAP (healthcare-associated pneumonia) 09/02/2016  . Hyperkalemia 09/02/2016  . Sacral decubitus ulcer 09/02/2016  . Pressure ulcer 08/27/2016  . Acute hematogenous osteomyelitis of left foot ([place]) 08/09/2016  . Cellulitis and abscess of lower extremity 05/29/2016  . Anemia of chronic disease 04/27/2016  . Status post amputation of toe of left foot ([place]) 04/27/2016  . Systolic heart failure ([place]) 07/09/2015  . Bradycardia 08/14/2014  . Prolonged QT interval 08/14/2014  . Gait disorder 08/14/2014  . Seizure ([place]) 08/13/2014  . Pancytopenia ([place]) 08/13/2014  . ARF (acute renal failure) ([place]) 08/13/2014  . Altered mental status 06/18/2014  . Closed dislocation of metatarsophalangeal (joint) 03/26/2013  . Insulin dependent diabetes mellitus ([place]) 12/11/2012  . Osteomyelitis of metatarsal ([place]) 12/11/2012  . Hyperlipidemia 12/11/2012  . Coronary artery disease   . Rheumatoid arthritis ([place])   . Hypertension  Past Surgical History:  Procedure Laterality Date  . 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 CARDIAC SURGERY    . FRACTURE SURGERY    . MUSCLE BIOPSY  06/2016  . right foot surgery-Toe amputations    . TOTAL HIP ARTHROPLASTY Left         Home Medications    Prior to Admission medications   Medication Sig Start Date End Date Taking? Authorizing Provider  amitriptyline (ELAVIL)  50 MG tablet TAKE 1 AND 1/2 TABLETS BY MOUTH AT BEDTIME. Patient taking differently: Take 75 mg by mouth at bedtime.  05/06/19   [name], MD  aspirin EC 81 MG tablet Take 81 mg by mouth daily.    [provider]  atorvastatin (LIPITOR) 80 MG tablet TAKE ONE TABLET BY MOUTH ONCE DAILY. Patient taking differently: Take 80 mg by mouth daily.  06/03/19   [name], MD  blood glucose meter kit and supplies KIT Dispense based on patient and insurance preference. Use up to 3 times daily as directed. DX:E11.9 08/21/19   [name], MD  cephALEXin (KEFLEX) 500 MG capsule Take 1 capsule (500 mg total) by mouth 3 (three) times daily. 08/26/19   [name], MD  Cholecalciferol (VITAMIN D3) 25 MCG (1000 UT) CHEW Chew 1 tablet by mouth daily.    [provider]  clonazePAM (KLONOPIN) 0.25 MG disintegrating tablet DISSOLVE 1 TABLET BY MOUTH DAILY AS NEEDED FOR ANXIETY. 07/22/19   [name], MD  docusate sodium (COLACE) 100 MG capsule Take 100 mg by mouth 3 (three) times daily.     [provider]  doxycycline (VIBRA-TABS) 100 MG tablet Take 1 tablet (100 mg total) by mouth 2 (two) times daily. 08/15/19   [name], MD  DULoxetine (CYMBALTA) 30 MG capsule Take 30 mg by mouth daily.  10/16/18 10/16/19  [provider]  finasteride (PROSCAR) 5 MG tablet Take 5 mg by mouth daily.  04/24/18   [provider]  hydroxychloroquine (PLAQUENIL) 200 MG tablet Take 200 mg by mouth daily.  04/24/18   [provider]  insulin aspart protamine- aspart (NOVOLOG MIX 70/30) (70-30) 100 UNIT/ML injection Inject 12 units into skin in the morning  and 8 units evening Patient taking differently: Inject 8-12 Units into the skin See admin instructions. Inject 12 units subcutaneously after breakfast and 8 units after supper 05/06/19   [name], MD  methadone (DOLOPHINE) 10 MG tablet Take 10 mg by mouth every 6 (six) hours.  04/18/18    [provider]  methadone (DOLOPHINE) 5 MG tablet Take 5 mg by mouth 3 (three) times daily as needed (breakthrough pain).  04/18/18   [provider]  polyethylene glycol powder (MIRALAX) powder Take 17 g by mouth 2 (two) times daily. 03/01/18   [name], MD  Polyvinyl Alcohol-Povidone (REFRESH OP) Place 1 drop into both eyes daily as needed (dry eyes).    [provider]  predniSONE (DELTASONE) 5 MG tablet TAKE 1 TABLET BY MOUTH ONCE A DAY WITH BREAKFAST. Patient taking differently: Take 5 mg by mouth daily with breakfast.  06/03/19   [name], MD  pregabalin (LYRICA) 25 MG capsule TAKE ONE CAPSULE BY MOUTH AT BEDTIME. Patient taking differently: Take 25 mg by mouth at bedtime.  06/03/19   [name], MD  tamsulosin (FLOMAX) 0.4 MG CAPS capsule Take 1 capsule (0.4 mg total) by mouth at bedtime. 05/06/19  [name], MD  torsemide (DEMADEX) 10 MG tablet TAKE 2 TABLETS BY MOUTH EVERY OTHER DAY. Patient taking differently: Take 20 mg by mouth every other day.  05/06/19   [name], MD    Family History Family History  Problem Relation Age of Onset  . Diabetes Father   . Dementia Father   . Heart attack Brother        multiple brothers with MIs  . Hypertension Mother   . Stroke Mother     Social History Social History   Tobacco Use  . Smoking status: Never Smoker  . Smokeless tobacco: Never Used  Substance Use Topics  . Alcohol use: No  . Drug use: No     Allergies   Sulfa antibiotics   Review of Systems Review of Systems 10 Systems reviewed and are negative for acute change except as noted in the HPI.   Physical Exam Updated Vital Signs Ht _0  (1.753 m)   Wt 81.6 kg   SpO2 90%   BMI 26.58 kg/m   Physical Exam Constitutional:      Comments: Patient is very frail and pale in appearance.  He is alert with clear mental status.  No respiratory distress at rest.  HENT:     Head: Normocephalic and  atraumatic.     Mouth/Throat:     Pharynx: Oropharynx is clear.  Eyes:     Extraocular Movements: Extraocular movements intact.  Neck:     Musculoskeletal: Neck supple.  Cardiovascular:     Comments: Bradycardia correlating with monitor rate of 35.  Harsh systolic murmur. Pulmonary:     Comments: No respiratory distress at rest.  Breath sounds soft.  Grossly clear. Abdominal:     General: There is no distension.     Palpations: Abdomen is soft.     Tenderness: There is no abdominal tenderness. There is no guarding.  Musculoskeletal:     Comments: Right lower extremity with forefoot toe amputation.  No edema of the ankle or foot.  Calf is soft and nontender.  Left lower extremity BKA no swelling or erythema.  Skin:    General: Skin is warm and dry.     Coloration: Skin is pale.  Neurological:     General: No focal deficit present.     Mental Status: He is oriented to person, place, and time.     Coordination: Coordination normal.  Psychiatric:        Mood and Affect: Mood normal.      ED Treatments / Results  Labs (all labs ordered are listed, but only abnormal results are displayed) Labs Reviewed  BASIC METABOLIC PANEL  CBC  TROPONIN I (HIGH SENSITIVITY)    EKG EKG Interpretation  Date/Time:  Thursday August 29 2019 15:35:37 EDT Ventricular Rate:  36 PR Interval:    QRS Duration: 96 QT Interval:  607 QTC Calculation: 470 R Axis:   -23 Text Interpretation:  Junctional rhythm Abnormal R-wave progression, late transition Inferior infarct, old Third degree heart block change from previous Confirmed by Charlesetta Shanks [phone]) on 08/29/2019 4:08:08 PM   Radiology No results found.  Procedures Procedures (including critical care time)  Medications Ordered in ED Medications  sodium chloride flush (NS) 0.9 % injection 3 mL (has no administration in time range)     Initial Impression / Assessment and Plan / ED Course  I have reviewed the triage vital signs and  the nursing notes.  Pertinent labs & imaging results that were  available during my care of the patient were reviewed by me and considered in my medical decision making (see chart for details).       Patient with severe underlying medical conditions including aortic stenosis and history of coronary artery disease with CABG.  He had significant chest pain throughout the night last night.  He is sent by cardiology for direct admission.  Patient is alert and appropriate.  He does have clear mental status.  Blood pressures are normal.  Heart rate is in the 30s and EKG suggestive of third-degree AV block.  He is however perfusing with clear mental status and no respiratory distress and stable airway.  Cardiology writing orders for admission and management..  Final Clinical Impressions(s) / ED Diagnoses   Final diagnoses:  Heart block AV third degree ([place])  Severe comorbid illness    ED Discharge Orders    None       Charlesetta Shanks, MD 08/29/19 [phone]

## 2019-08-29 NOTE — Progress Notes (Signed)
ANTICOAGULATION CONSULT NOTE - Initial Consult  Pharmacy Consult for heparin  Indication: chest pain/ACS  Allergies  Allergen Reactions  . Sulfa Antibiotics Rash    Patient Measurements: Height: 5\' 9"  (175.3 cm) Weight: 180 lb (81.6 kg) IBW/kg (Calculated) : 70.7 Heparin Dosing Weight: 80kg  Vital Signs: Temp: 98.5 F (36.9 C) (09/03 1915) Temp Source: Oral (09/03 1915) BP: 130/56 (09/03 2015) Pulse Rate: 80 (09/03 2015)  Labs: Recent Labs    08/29/19 1545 08/29/19 1727 08/29/19 1751  HGB 10.6*  --  9.5*  HCT 34.5*  --  30.1*  PLT 138*  --  113*  CREATININE 2.77*  --  2.91*  TROPONINIHS 505* 544* 528*    Estimated Creatinine Clearance: 24 mL/min (A) (by C-G formula based on SCr of 2.91 mg/dL (H)).   Medical History: Past Medical History:  Diagnosis Date  . Aortic stenosis   . Chronic back pain   . Chronic combined systolic (congestive) and diastolic (congestive) heart failure (HCC)    a. EF 30-35% in 2016 b. at 30-35% by echo in 08/2016 c. 06/2019: echo showing EF of 60-65% but found to have severe AS.   [name] Chronic neck pain   . Coronary artery disease    a. s/p CABG in 06/2015 with LIMA-LAD, SVG-PDA, SVG-Intermediate  . Diabetes mellitus   . Diabetic neuropathy ([place])   . Hyperkalemia   . Hypertension   . Loculated pleural effusion 09/09/2016  . Pancytopenia ([place]) 08/13/2014  . 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  . Rheumatoid arthritis(714.0)   . Seizure ([place]) 08/13/2014   Pt denies   Assessment: 68 year old male now back from cath lab for temp pacer wire in rt groin. Heparin ordered for r/o acs prior to cath, d/w on call fellow and will restart heparin 6h post procedure w/o bolus. Plan for cath in am. Hgb down slightly to 9.5, no bleeding noted.   Goal of Therapy:  Heparin level 0.3-0.7 units/ml Monitor platelets by anticoagulation protocol: Yes   Plan:  Start heparin infusion at 950 units/hr>>9/4  0100 Check anti-Xa level in 8 hours and daily while on heparin Continue to monitor H&H and platelets  Erin Hearing PharmD., BCPS Clinical Pharmacist 08/29/2019 8:46 PM

## 2019-08-29 NOTE — Interval H&P Note (Signed)
History and Physical Interval Note:  08/29/2019 6:15 PM  [name]  has presented today for surgery, with the diagnosis of complete heart block.  The various methods of treatment have been discussed with the patient and family. After consideration of risks, benefits and other options for treatment, the patient has consented to  Procedure(s): TEMPORARY PACEMAKER (N/A) as a surgical intervention.  The patient's history has been reviewed, patient examined, no change in status, stable for surgery.  I have reviewed the patient's chart and labs.  Questions were answered to the patient's satisfaction.    Pt found to be in complete heart block. With aortic stenosis and multiple medical problems, temporary pacing is indicated.   Sherren Mocha

## 2019-08-30 ENCOUNTER — Inpatient Hospital Stay (HOSPITAL_COMMUNITY)
Admission: EM | Disposition: A | Discharge status: Home / Self Care | Payer: Self-pay | Source: Home / Self Care | Attending: Cardiovascular Disease

## 2019-08-30 ENCOUNTER — Encounter (HOSPITAL_COMMUNITY): Payer: Self-pay | Admitting: Cardiovascular Disease

## 2019-08-30 ENCOUNTER — Telehealth: Payer: Self-pay

## 2019-08-30 ENCOUNTER — Ambulatory Visit (HOSPITAL_COMMUNITY): Admission: RE | Admit: 2019-08-30 | Payer: Medicare HMO | Source: Home / Self Care | Admitting: Cardiovascular Disease

## 2019-08-30 DIAGNOSIS — I442 Atrioventricular block, complete: Secondary | ICD-10-CM

## 2019-08-30 DIAGNOSIS — I200 Unstable angina: Secondary | ICD-10-CM

## 2019-08-30 DIAGNOSIS — Z951 Presence of aortocoronary bypass graft: Secondary | ICD-10-CM

## 2019-08-30 DIAGNOSIS — N184 Chronic kidney disease, stage 4 (severe): Secondary | ICD-10-CM

## 2019-08-30 HISTORY — PX: PACEMAKER IMPLANT: EP1218

## 2019-08-30 LAB — MRSA PCR SCREENING: MRSA by PCR: NEGATIVE

## 2019-08-30 LAB — LIPID PANEL
Cholesterol: 115 mg/dL (ref 0–200)
HDL: 48 mg/dL (ref >40)
LDL Cholesterol: 58 mg/dL (ref 0–99)
Total CHOL/HDL Ratio: 2.4 RATIO
Triglycerides: 43 mg/dL (ref <150)
VLDL: 9 mg/dL (ref 0–40)

## 2019-08-30 LAB — CBC
HCT: 27.7 % — ABNORMAL LOW (ref 39.0–52.0)
Hemoglobin: 8.9 g/dL — ABNORMAL LOW (ref 13.0–17.0)
MCH: 30.6 pg (ref 26.0–34.0)
MCHC: 32.1 g/dL (ref 30.0–36.0)
MCV: 95.2 fL (ref 80.0–100.0)
Platelets: 96 10*3/uL — ABNORMAL LOW (ref 150–400)
RBC: 2.91 MIL/uL — ABNORMAL LOW (ref 4.22–5.81)
RDW: 13.9 % (ref 11.5–15.5)
WBC: 7.8 10*3/uL (ref 4.0–10.5)
nRBC: 0 % (ref 0.0–0.2)

## 2019-08-30 LAB — BASIC METABOLIC PANEL
Anion gap: 9 (ref 5–15)
BUN: 43 mg/dL — ABNORMAL HIGH (ref 8–23)
CO2: 24 mmol/L (ref 22–32)
Calcium: 9 mg/dL (ref 8.9–10.3)
Chloride: 102 mmol/L (ref 98–111)
Creatinine, Ser: 2.32 mg/dL — ABNORMAL HIGH (ref 0.61–1.24)
GFR calc Af Amer: 32 mL/min — ABNORMAL LOW (ref >60)
GFR calc non Af Amer: 28 mL/min — ABNORMAL LOW (ref >60)
Glucose, Bld: 166 mg/dL — ABNORMAL HIGH (ref 70–99)
Potassium: 4.9 mmol/L (ref 3.5–5.1)
Sodium: 135 mmol/L (ref 135–145)

## 2019-08-30 LAB — HEPARIN LEVEL (UNFRACTIONATED): Heparin Unfractionated: 0.23 IU/mL — ABNORMAL LOW (ref 0.30–0.70)

## 2019-08-30 LAB — SURGICAL PCR SCREEN
MRSA, PCR: NEGATIVE
Staphylococcus aureus: NEGATIVE

## 2019-08-30 LAB — GLUCOSE, CAPILLARY
Glucose-Capillary: 147 mg/dL — ABNORMAL HIGH (ref 70–99)
Glucose-Capillary: 163 mg/dL — ABNORMAL HIGH (ref 70–99)
Glucose-Capillary: 178 mg/dL — ABNORMAL HIGH (ref 70–99)
Glucose-Capillary: 313 mg/dL — ABNORMAL HIGH (ref 70–99)

## 2019-08-30 SURGERY — PACEMAKER IMPLANT

## 2019-08-30 MED ORDER — MIDAZOLAM HCL 5 MG/5ML IJ SOLN
INTRAMUSCULAR | Status: AC
  Filled 2019-08-30: qty 5

## 2019-08-30 MED ORDER — LIDOCAINE HCL (PF) 1 % IJ SOLN
INTRAMUSCULAR | Status: DC | PRN
  Administered 2019-08-30: 60 mL

## 2019-08-30 MED ORDER — MIDAZOLAM HCL 5 MG/5ML IJ SOLN
INTRAMUSCULAR | Status: DC | PRN
  Administered 2019-08-30: 1 mg via INTRAVENOUS

## 2019-08-30 MED ORDER — HEPARIN (PORCINE) IN NACL 1000-0.9 UT/500ML-% IV SOLN
INTRAVENOUS | Status: DC | PRN
  Administered 2019-08-30: 500 mL

## 2019-08-30 MED ORDER — HEPARIN (PORCINE) 25000 UT/250ML-% IV SOLN
1150.0000 [IU]/h | INTRAVENOUS | Status: DC
  Administered 2019-09-01: 1050 [IU]/h via INTRAVENOUS
  Administered 2019-09-02: 10:00:00 1250 [IU]/h via INTRAVENOUS
  Filled 2019-08-30 (×2): qty 250

## 2019-08-30 MED ORDER — SODIUM CHLORIDE 0.9 % IV SOLN: INTRAVENOUS | Status: DC

## 2019-08-30 MED ORDER — FENTANYL CITRATE (PF) 100 MCG/2ML IJ SOLN
INTRAMUSCULAR | Status: AC
  Filled 2019-08-30: qty 2

## 2019-08-30 MED ORDER — SODIUM CHLORIDE 0.9 % IV SOLN
INTRAVENOUS | Status: DC
  Administered 2019-08-30: 10:00:00 via INTRAVENOUS

## 2019-08-30 MED ORDER — SODIUM CHLORIDE 0.9 % IV SOLN: 250.0000 mL | INTRAVENOUS | Status: DC | PRN

## 2019-08-30 MED ORDER — SODIUM CHLORIDE 0.9% FLUSH: 3.0000 mL | INTRAVENOUS | Status: DC | PRN

## 2019-08-30 MED ORDER — ASPIRIN 81 MG PO CHEW: 81.0000 mg | CHEWABLE_TABLET | ORAL | Status: AC

## 2019-08-30 MED ORDER — LIDOCAINE HCL (PF) 1 % IJ SOLN
INTRAMUSCULAR | Status: AC
  Filled 2019-08-30: qty 60

## 2019-08-30 MED ORDER — ONDANSETRON HCL 4 MG/2ML IJ SOLN: 4.0000 mg | Freq: Four times a day (QID) | INTRAMUSCULAR | Status: DC | PRN

## 2019-08-30 MED ORDER — CEFAZOLIN SODIUM-DEXTROSE 2-4 GM/100ML-% IV SOLN
INTRAVENOUS | Status: AC
  Filled 2019-08-30: qty 100

## 2019-08-30 MED ORDER — SODIUM CHLORIDE 0.9 % IV SOLN
INTRAVENOUS | Status: AC
  Filled 2019-08-30: qty 2

## 2019-08-30 MED ORDER — HEPARIN (PORCINE) IN NACL 1000-0.9 UT/500ML-% IV SOLN
INTRAVENOUS | Status: AC
  Filled 2019-08-30: qty 500

## 2019-08-30 MED ORDER — CEFAZOLIN SODIUM-DEXTROSE 2-4 GM/100ML-% IV SOLN
2.0000 g | INTRAVENOUS | Status: AC
  Administered 2019-08-30: 12:00:00 2 g via INTRAVENOUS
  Filled 2019-08-30: qty 100

## 2019-08-30 MED ORDER — ACETAMINOPHEN 325 MG PO TABS: 325.0000 mg | ORAL_TABLET | ORAL | Status: DC | PRN

## 2019-08-30 MED ORDER — SODIUM CHLORIDE 0.9 % IV SOLN
80.0000 mg | INTRAVENOUS | Status: AC
  Administered 2019-08-30: 80 mg
  Filled 2019-08-30: qty 2

## 2019-08-30 MED ORDER — CEFAZOLIN SODIUM-DEXTROSE 1-4 GM/50ML-% IV SOLN
1.0000 g | Freq: Four times a day (QID) | INTRAVENOUS | Status: AC
  Administered 2019-08-30 – 2019-08-31 (×3): 1 g via INTRAVENOUS
  Filled 2019-08-30 (×3): qty 50

## 2019-08-30 SURGICAL SUPPLY — 13 items
CABLE SURGICAL S-101-97-12 (CABLE) ×2 IMPLANT
CATH RIGHTSITE C315HIS02 (CATHETERS) ×1 IMPLANT
COVER DOME SNAP 22 D (MISCELLANEOUS) ×1 IMPLANT
IPG PACE AZUR XT DR MRI W1DR01 (Pacemaker) IMPLANT
LEAD CAPSURE NOVUS 5076-52CM (Lead) ×1 IMPLANT
LEAD SELECT SECURE 3830 383069 (Lead) IMPLANT
PACE AZURE XT DR MRI W1DR01 (Pacemaker) ×2 IMPLANT
PAD PRO RADIOLUCENT 2001M-C (PAD) ×2 IMPLANT
SELECT SECURE 3830 383069 (Lead) ×2 IMPLANT
SHEATH 7FR PRELUDE SNAP 13 (SHEATH) ×2 IMPLANT
SLITTER 6232ADJ (MISCELLANEOUS) ×1 IMPLANT
TRAY PACEMAKER INSERTION (PACKS) ×2 IMPLANT
WIRE HI TORQ VERSACORE-J 145CM (WIRE) ×1 IMPLANT

## 2019-08-30 NOTE — Progress Notes (Signed)
ANTICOAGULATION CONSULT NOTE - Follow Up Consult  Pharmacy Consult for heparin  Indication: chest pain/ACS  Allergies  Allergen Reactions  . Sulfa Antibiotics Rash    Patient Measurements: Height: 5\' 9"  (175.3 cm) Weight: 173 lb 1 oz (78.5 kg) IBW/kg (Calculated) : 70.7 Heparin Dosing Weight: 80kg  Vital Signs: Temp: 98 F (36.7 C) (09/04 0700) Temp Source: Oral (09/04 0700) BP: 138/61 (09/04 0900) Pulse Rate: 79 (09/04 0900)  Labs: Recent Labs    08/29/19 1545 08/29/19 1727 08/29/19 1751 08/29/19 2004 08/30/19 0215 08/30/19 0933  HGB 10.6*  --  9.5*  --  8.9*  --   HCT 34.5*  --  30.1*  --  27.7*  --   PLT 138*  --  113*  --  96*  --   HEPARINUNFRC  --   --   --   --   --  0.23*  CREATININE 2.77*  --  2.91*  --  2.32*  --   TROPONINIHS 505* 544* 528* 427*  --   --     Estimated Creatinine Clearance: 30.1 mL/min (A) (by C-G formula based on SCr of 2.32 mg/dL (H)).   Medical History: Past Medical History:  Diagnosis Date  . Aortic stenosis   . Chronic back pain   . Chronic combined systolic (congestive) and diastolic (congestive) heart failure (HCC)    a. EF 30-35% in 2016 b. at 30-35% by echo in 08/2016 c. 06/2019: echo showing EF of 60-65% but found to have severe AS.   [name] Chronic neck pain   . Coronary artery disease    a. s/p CABG in 06/2015 with LIMA-LAD, SVG-PDA, SVG-Intermediate  . Diabetes mellitus   . Diabetic neuropathy ([place])   . Hyperkalemia   . Hypertension   . Loculated pleural effusion 09/09/2016  . Pancytopenia ([place]) 08/13/2014  . 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  . Rheumatoid arthritis(714.0)   . Seizure ([place]) 08/13/2014   Pt denies   Assessment: 68 year old male now back from cath lab for temp pacer wire in rt groin. Heparin ordered for r/o acs prior to cath and continued post pacer placement. No plans for cath today since renal function remains elevated from baseline. Hgb down slightly  to 8.9, no bleeding noted.    Heparin level subtherapeutic at 0.22.  EP consulted 9/4 and would like to place permanent pacemaker today. Heparin gtt now off.   Goal of Therapy:  Heparin level 0.3-0.7 units/ml Monitor platelets by anticoagulation protocol: Yes   Plan:  Hold heparin until PPM  Will clarify with cardiology and EP about resuming heparin post PPM in anticipation for cath   Vertis Kelch, PharmD PGY2 Cardiology Pharmacy Resident Phone [phone] 08/30/2019       10:11 AM  Please check [email or web address] for unit-specific pharmacist phone numbers

## 2019-08-30 NOTE — Discharge Instructions (Signed)
After Your Pacemaker   Do not lift your arm above shoulder height for 1 week after your procedure. After 7 days, you may progress as below.     Friday September 06, 2019  Saturday September 07, 2019 Sunday September 08, 2019 Monday September 09, 2019    Do not lift, push, pull, or carry anything over 10 pounds with the affected arm until 6 weeks (Friday October 11, 2019) after your procedure.    Do not drive until your wound check, or until instructed by your healthcare provider that you are safe to do so.    Monitor your pacemaker site for redness, swelling, and drainage. Call the device clinic at [phone] if you experience these symptoms or fever/chills.   If your incision is sealed with Steri-strips or staples. You may shower 7 days after your procedure and wash your incision with soap and water as long as it is healed. If your incision is closed with Dermabond/Surgical glue. You may shower 1 day after your pacemaker implant and wash your incision with soap and water. Avoid lotions, ointments, or perfumes over your incision until it is well-healed.   You may use a hot tub or a pool AFTER your wound check appointment if the incision is completely closed.   Your Pacemaker may be MRI compatible. We will discuss this at your first follow up/wound check. .    Remote monitoring is used to monitor your pacemaker from home. This monitoring is scheduled every 91 days by our office. It allows [place] to keep an eye on the functioning of your device to ensure it is working properly. You will routinely see your Electrophysiologist annually (more often if necessary).    Pacemaker Implantation, Care After This sheet gives you information about how to care for yourself after your procedure. Your health care provider may also give you more specific instructions. If you have problems or questions, contact your health care provider. What can I expect after the procedure? After the procedure, it is  common to have:  Mild pain.  Slight bruising.  Some swelling over the incision.  A slight bump over the skin where the device was placed. Sometimes, it is possible to feel the device under the skin. This is normal.  You should received your Pacemaker ID card within 4-8 weeks. Follow these instructions at home: Medicines  Take over-the-counter and prescription medicines only as told by your health care provider.  If you were prescribed an antibiotic medicine, take it as told by your health care provider. Do not stop taking the antibiotic even if you start to feel better. Wound care     Do not remove the bandage on your chest until directed to do so by your health care provider.  After your bandage is removed, you may see pieces of tape called skin adhesive strips over the area where the cut was made (incision site). Let them fall off on their own.  Check the incision site every day to make sure it is not infected, bleeding, or starting to pull apart.  Do not use lotions or ointments near the incision site unless directed to do so.  Keep the incision area clean and dry for 7 days after the procedure or as directed by your health care provider. It takes several weeks for the incision site to completely heal.  Do not take baths, swim, or use a hot tub for 7-10 days or as otherwise directed by your health care provider. Activity  Do not  drive or use heavy machinery while taking prescription pain medicine.  Do not drive for 24 hours if you were given a medicine to help you relax (sedative).  Check with your health care provider before you start to drive or play sports.  Avoid sudden jerking, pulling, or chopping movements that pull your upper arm far away from your body. Avoid these movements for at least 6 weeks or as long as told by your health care provider.  Do not lift your upper arm above your shoulders for at least 6 weeks or as long as told by your health care provider.  This means no tennis, golf, or swimming.  You may go back to work when your health care provider says it is okay. Pacemaker care  You may be shown how to transfer data from your pacemaker through the phone to your health care provider.  Always let all health care providers know about your pacemaker before you have any medical procedures or tests.  Wear a medical ID bracelet or necklace stating that you have a pacemaker. Carry a pacemaker ID card with you at all times.  Your pacemaker battery will last for 5-15 years. Routine checks by your health care provider will let the health care provider know when the battery is starting to run down. The pacemaker will need to be replaced when the battery starts to run down.  Do not use amateur Chief of Staff. Other electrical devices are safe to use, including power tools, lawn mowers, and speakers. If you are unsure of whether something is safe to use, ask your health care provider.  When using your cell phone, hold it to the ear opposite the pacemaker. Do not leave your cell phone in a pocket over the pacemaker.  Avoid places or objects that have a strong electric or magnetic field, including: ? Airport Herbalist. When at the airport, let officials know that you have a pacemaker. ? Power plants. ? Large electrical generators. ? Radiofrequency transmission towers, such as cell phone and radio towers. General instructions  Weigh yourself every day. If you suddenly gain weight, fluid may be building up in your body.  Keep all follow-up visits as told by your health care provider. This is important. Contact a health care provider if:  You gain weight suddenly.  Your legs or feet swell.  It feels like your heart is fluttering or skipping beats (heart palpitations).  You have chills or a fever.  You have more redness, swelling, or pain around your incisions.  You have more fluid or blood coming from your  incisions.  Your incisions feel warm to the touch.  You have pus or a bad smell coming from your incisions. Get help right away if:  You have chest pain.  You have trouble breathing or are short of breath.  You become extremely tired.  You are light-headed or you faint. This information is not intended to replace advice given to you by your health care provider. Make sure you discuss any questions you have with your health care provider.

## 2019-08-30 NOTE — Consult Note (Addendum)
ELECTROPHYSIOLOGY CONSULT NOTE    Patient ID: DAVIYON [name] MRN: [id number], DOB/AGE: [birth year] 68 y.o.  Admit date: 08/29/2019 Date of Consult: 08/30/2019  Primary Physician: [name], MD Primary Cardiologist: Dr. [name] Electrophysiologist: New  Referring Provider: Dr. Gwenlyn Found   Patient Profile: [name] is a 68 y.o. male with a history of CAD s/p CABG 2016, DM, HTN, post op afib, CKD III-IV, HLD, PAD s/p left below knee amputation, RA, and chronic neck pain with neuropathy who is being seen today for the evaluation of intermittent CHB at the request of Dr. Gwenlyn Found.  HPI:  [name] is a 68 y.o. male seen by Dr. Angelena Form 08/29/2019 for consideration of TAVR.  He c/o intermittent chest pain x 48 hours described as a burning in his chest with worsening dyspnea and general weakness.  He was sent for admission with his history of CAD and concerns for unstable angina.   EKG on arrival significant for Complete heart block with junctional escape at 36 bpm. Taken for urgent temp pacer.   This am, intermittently pacing with underlying NSR ~ 70- 75 bpm.   EP team asked to see for PPM consideration.   He denies current chest pain. He has a distant history of feeling lightheaded with near syncope, but nothing more specific.  He was not on any AV nodal blocking agents prior to admission.   Past Medical History:  Diagnosis Date  . Aortic stenosis   . Chronic back pain   . Chronic combined systolic (congestive) and diastolic (congestive) heart failure (HCC)    a. EF 30-35% in 2016 b. at 30-35% by echo in 08/2016 c. 06/2019: echo showing EF of 60-65% but found to have severe AS.   [name] Chronic neck pain   . Coronary artery disease    a. s/p CABG in 06/2015 with LIMA-LAD, SVG-PDA, SVG-Intermediate  . Diabetes mellitus   . Diabetic neuropathy ([place])   . Hyperkalemia   . Hypertension   . Loculated pleural effusion 09/09/2016  . Pancytopenia ([place]) 08/13/2014  . 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  . Rheumatoid arthritis(714.0)   . Seizure ([place]) 08/13/2014   Pt denies     Surgical History:  Past Surgical History:  Procedure Laterality Date  . 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 CARDIAC SURGERY    . FRACTURE SURGERY    . MUSCLE BIOPSY  06/2016  . right foot surgery-Toe amputations    . TEMPORARY PACEMAKER N/A 08/29/2019   Procedure: TEMPORARY PACEMAKER;  Surgeon: Sherren Mocha, MD;  Location: [facility];  Service: Cardiovascular;  Laterality: N/A;  . TOTAL HIP ARTHROPLASTY Left      Medications Prior to Admission  Medication Sig Dispense Refill Last Dose  . amitriptyline (ELAVIL) 50 MG tablet TAKE 1 AND 1/2 TABLETS BY MOUTH AT BEDTIME. (Patient taking differently: Take 75 mg by mouth at bedtime. ) 45 tablet 5 08/28/2019 at Unknown time  . aspirin EC 81 MG tablet Take 81 mg by mouth daily.   08/29/2019 at Unknown time  . atorvastatin (LIPITOR) 80 MG tablet TAKE ONE TABLET BY MOUTH ONCE DAILY. (Patient taking differently: Take 80 mg by mouth daily. ) 30 tablet 5 08/29/2019 at Unknown time  . cephALEXin (KEFLEX) 500 MG capsule Take 1 capsule (500 mg total) by mouth 3 (three) times daily. 30 capsule 0 08/29/2019 at Unknown time  . Cholecalciferol (VITAMIN D3) 25 MCG (  1000 UT) CHEW Chew 1 tablet by mouth daily.   08/29/2019 at Unknown time  . clonazePAM (KLONOPIN) 0.25 MG disintegrating tablet DISSOLVE 1 TABLET BY MOUTH DAILY AS NEEDED FOR ANXIETY. (Patient taking differently: Take 0.25 mg by mouth 2 (two) times daily as needed (Anxiety). [name]) 20 tablet 1 08/28/2019 at Unknown time  . docusate sodium (COLACE) 100 MG capsule Take 100 mg by mouth 3 (three) times daily.    08/29/2019 at Unknown time  . DULoxetine (CYMBALTA) 30 MG capsule Take 30 mg by mouth daily.    08/29/2019 at Unknown time  . finasteride (PROSCAR) 5 MG tablet Take 5 mg by mouth daily.    08/29/2019 at Unknown time  . hydroxychloroquine (PLAQUENIL) 200 MG tablet  Take 200 mg by mouth daily.    08/29/2019 at Unknown time  . insulin aspart protamine- aspart (NOVOLOG MIX 70/30) (70-30) 100 UNIT/ML injection Inject 12 units into skin in the morning  and 8 units evening (Patient taking differently: Inject 8-12 Units into the skin See admin instructions. Inject 8 units subcutaneously after breakfast and 12 units after supper) 10 mL 5 08/29/2019 at Unknown time  . methadone (DOLOPHINE) 10 MG tablet Take 10 mg by mouth every 6 (six) hours.    08/29/2019 at Unknown time  . methadone (DOLOPHINE) 5 MG tablet Take 5 mg by mouth 3 (three) times daily as needed (breakthrough pain).    08/28/2019 at prn  . polyethylene glycol powder (MIRALAX) powder Take 17 g by mouth 2 (two) times daily. 255 g  08/28/2019 at Unknown time  . Polyvinyl Alcohol-Povidone (REFRESH OP) Place 1 drop into both eyes daily as needed (dry eyes).   08/28/2019 at prn  . predniSONE (DELTASONE) 5 MG tablet TAKE 1 TABLET BY MOUTH ONCE A DAY WITH BREAKFAST. (Patient taking differently: Take 5 mg by mouth daily with breakfast. ) 30 tablet 5 08/29/2019 at Unknown time  . pregabalin (LYRICA) 25 MG capsule TAKE ONE CAPSULE BY MOUTH AT BEDTIME. (Patient taking differently: Take 25 mg by mouth at bedtime. ) 30 capsule 5 08/28/2019 at Unknown time  . senna (SENOKOT) 8.6 MG TABS tablet Take 1 tablet by mouth 2 (two) times daily.   08/29/2019 at Unknown time  . tamsulosin (FLOMAX) 0.4 MG CAPS capsule Take 1 capsule (0.4 mg total) by mouth at bedtime. 30 capsule 5 08/28/2019 at Unknown time  . torsemide (DEMADEX) 10 MG tablet TAKE 2 TABLETS BY MOUTH EVERY OTHER DAY. (Patient taking differently: Take 20 mg by mouth every other day. ) 30 tablet 5 08/29/2019 at Unknown time  . blood glucose meter kit and supplies KIT Dispense based on patient and insurance preference. Use up to 3 times daily as directed. DX:E11.9 1 each 0     Inpatient Medications:  . amitriptyline  75 mg Oral QHS  . aspirin  81 mg Oral Pre-Cath  . aspirin EC  81 mg Oral  Daily  . atorvastatin  80 mg Oral q1800  . cephALEXin  500 mg Oral TID  . Chlorhexidine Gluconate Cloth  6 each Topical Daily  . docusate sodium  100 mg Oral TID  . DULoxetine  30 mg Oral Daily  . finasteride  5 mg Oral Daily  . hydroxychloroquine  200 mg Oral Daily  . insulin aspart  0-15 Units Subcutaneous TID WC  . insulin aspart  0-5 Units Subcutaneous QHS  . methadone  10 mg Oral Q6H  . polyethylene glycol  17 g Oral BID  . predniSONE  5 mg Oral Q breakfast  . pregabalin  25 mg Oral QHS  . sodium chloride flush  3 mL Intravenous Once  . sodium chloride flush  3 mL Intravenous Q12H  . tamsulosin  0.4 mg Oral QHS  . torsemide  20 mg Oral QODAY    Allergies:  Allergies  Allergen Reactions  . Sulfa Antibiotics Rash    Social History   Socioeconomic History  . Marital status: Married    Spouse name: Not on file  . Number of children: 2  . Years of education: Not on file  . Highest education level: Not on file  Occupational History  . Occupation: Retired Barista  . Financial resource strain: Not on file  . Food insecurity    Worry: Not on file    Inability: Not on file  . Transportation needs    Medical: Not on file    Non-medical: Not on file  Tobacco Use  . Smoking status: Never Smoker  . Smokeless tobacco: Never Used  Substance and Sexual Activity  . Alcohol use: No  . Drug use: No  . Sexual activity: Not on file  Lifestyle  . Physical activity    Days per week: Not on file    Minutes per session: Not on file  . Stress: Not on file  Relationships  . Social Herbalist on phone: Not on file    Gets together: Not on file    Attends religious service: Not on file    Active member of club or organization: Not on file    Attends meetings of clubs or organizations: Not on file    Relationship status: Not on file  . Intimate partner violence    Fear of current or ex partner: Not on file    Emotionally abused: Not on file     Physically abused: Not on file    Forced sexual activity: Not on file  Other Topics Concern  . Not on file  Social History Narrative  . Not on file     Family History  Problem Relation Age of Onset  . Diabetes Father   . Dementia Father   . Heart attack Brother        multiple brothers with MIs  . Hypertension Mother   . Stroke Mother      Review of Systems: All other systems reviewed and are otherwise negative except as noted above.  Physical Exam: Vitals:   08/30/19 0557 08/30/19 0600 08/30/19 0700 08/30/19 0740  BP:  139/64 (!) 141/59 (!) 141/59  Pulse: 71 70    Resp: '20 20 17   ' Temp:   98 F (36.7 C)   TempSrc:   Oral   SpO2: 100% 94%    Weight:      Height:        GEN- The patient is fatigued appearing, alert and oriented x 3 today.   HEENT: normocephalic, atraumatic; sclera clear, conjunctiva pink; hearing intact; oropharynx clear; neck supple Lungs- Clear to ausculation bilaterally, normal work of breathing.  No wheezes, rales, rhonchi Heart- Regular rate and rhythm, no murmurs, rubs or gallops GI- soft, non-tender, non-distended, bowel sounds present Extremities- no clubbing, cyanosis, or edema; DP/PT/radial pulses 2+ bilaterally. R groin temp pacer site stable. MS- no significant deformity or atrophy Skin- warm and dry, no rash or lesion Psych- euthymic mood, full affect Neuro- strength and sensation are intact  Labs:   Lab Results  Component Value Date  WBC 7.8 08/30/2019   HGB 8.9 (L) 08/30/2019   HCT 27.7 (L) 08/30/2019   MCV 95.2 08/30/2019   PLT 96 (L) 08/30/2019    Recent Labs  Lab 08/30/19 0215  NA 135  K 4.9  CL 102  CO2 24  BUN 43*  CREATININE 2.32*  CALCIUM 9.0  GLUCOSE 166*      Radiology/Studies: Dg Chest Portable 1 View  Result Date: 08/29/2019 CLINICAL DATA:  Chest pain. EXAM: PORTABLE CHEST 1 VIEW COMPARISON:  July 01, 2019 FINDINGS: External pacing pads are in place. Prior postsurgical changes from CABG. The cardiac  silhouette is enlarged. Low lung volumes. Right lower lobe atelectasis versus airspace consolidation. Small right pleural effusion. Osteoarthritic changes of bilateral shoulders. IMPRESSION: Low lung volumes with right lower lobe atelectasis versus airspace consolidation and small right pleural effusion. Apparent enlargement of the cardiac silhouette. Electronically Signed   By: Fidela Salisbury M.D.   On: 08/29/2019 16:11    EKG: From admission shows junctional escape at 36 bpm with CHB. EKG from this am shows intermittent V pacing in the 70s with underlying NSR (personally reviewed)  TELEMETRY: Intermittent V pacing in the upper 70s, with underlying NSR (personally reviewed)    Assessment/Plan: 1.  Intermittent CHB With new conduction disturbances, symptomatic bradycardia, and possible TAVR, decision made to proceed with PPM. Discussed risks and benefits with patient at length including bleeding, infection, heart attack, and stroke. He understands and agrees to proceed with PPM implantation.   2. CAD s/p CABG Admitted in the setting of unstable angina and found to have new CHB as above Plan L/RHC next week once Cr improves.  Per Gen cards.   3. Post op Afib He has distant history of post op Afib in the setting of his CABG If seen on device, will need [place] with CHA2DS2-VASc Score and unadjusted Ischemic Stroke Rate (% per year) equal to 4.8 % stroke rate/year from a score of 4 ( [HTN, DM, Vascular=MI/PAD/Aortic Plaque, Age 57-74]   For questions or updates, please contact [facility] Please consult [email or web address] for contact info under Cardiology/STEMI.  Jacalyn Lefevre, PA-C  08/30/2019 9:02 AM  EP attending  Patient seen and examined.  Agree with findings as noted above.  The patient is a 68 year old man with coronary artery disease status post bypass surgery, diabetes, hypertension, and renal insufficiency.  He presented to the hospital yesterday with complete  heart block and a junctional escape of 36 bpm.  He underwent insertion of a temporary pacemaker.  Today his conduction has improved.  He felt very poorly with the episode yesterday and has had these in the past.  He is now referred for pacemaker insertion.  Review of his previous ECGs demonstrates sinus rhythm with left bundle branch block on EKG several weeks ago.  In heart block, he had a relatively narrow ventricular escape.  His conduction has subsequently improved since admission.  Today he is conducting.  He was on no medications to slow his AV conduction.  He has known aortic valve disease and is pending TAVR evaluation.  I have discussed the treatment options with the patient.  With his history of high-grade conduction system disease, aortic valve disease, and presentation with heart block, in the absence of AV nodal blocking drugs, permanent pacemaker insertion is indicated.  He was quite symptomatic yesterday.  He has had a bump in his renal function.  Hopefully this will improve as his heart rate improves.  I discussed the risks, goals,  benefits, and expectations of pacemaker insertion with the patient and he wishes to proceed.  Cristopher Peru, MD

## 2019-08-30 NOTE — Progress Notes (Signed)
ANTICOAGULATION CONSULT NOTE - Follow Up Consult  Pharmacy Consult for heparin  Indication: chest pain/ACS  Allergies  Allergen Reactions  . Sulfa Antibiotics Rash    Patient Measurements: Height: 5\' 9"  (175.3 cm) Weight: 173 lb 1 oz (78.5 kg) IBW/kg (Calculated) : 70.7 Heparin Dosing Weight: 80kg  Vital Signs: Temp: 98.7 F (37.1 C) (09/04 1411) Temp Source: Axillary (09/04 1411) BP: 149/70 (09/04 1333) Pulse Rate: 65 (09/04 1333)  Labs: Recent Labs    08/29/19 1545 08/29/19 1727 08/29/19 1751 08/29/19 2004 08/30/19 0215 08/30/19 0933  HGB 10.6*  --  9.5*  --  8.9*  --   HCT 34.5*  --  30.1*  --  27.7*  --   PLT 138*  --  113*  --  96*  --   HEPARINUNFRC  --   --   --   --   --  0.23*  CREATININE 2.77*  --  2.91*  --  2.32*  --   TROPONINIHS 505* 544* 528* 427*  --   --     Estimated Creatinine Clearance: 30.1 mL/min (A) (by C-G formula based on SCr of 2.32 mg/dL (H)).   Medical History: Past Medical History:  Diagnosis Date  . Aortic stenosis   . Chronic back pain   . Chronic combined systolic (congestive) and diastolic (congestive) heart failure (HCC)    a. EF 30-35% in 2016 b. at 30-35% by echo in 08/2016 c. 06/2019: echo showing EF of 60-65% but found to have severe AS.   [name] Chronic neck pain   . Coronary artery disease    a. s/p CABG in 06/2015 with LIMA-LAD, SVG-PDA, SVG-Intermediate  . Diabetes mellitus   . Diabetic neuropathy ([place])   . Hyperkalemia   . Hypertension   . Loculated pleural effusion 09/09/2016  . Pancytopenia ([place]) 08/13/2014  . 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  . Rheumatoid arthritis(714.0)   . Seizure ([place]) 08/13/2014   Pt denies   Assessment: 68 year old male s/p temp pacer wire in rt groin placed on 9/3. Heparin ordered for r/o acs prior to cath and continued post pacer placement. No plans for cath today since renal function remains elevated from baseline. Hgb down slightly to  8.9, no bleeding noted.    Heparin level subtherapeutic at 0.22 at a rate of 950 units/hr  Now s/p PPM by EP on 9/4. Plan to still go to cath for chest pain workup, so heparin to resume until cath is able to be completed. EP recommended resuming heparin on 9/6 @ 1300  Goal of Therapy:  Heparin level 0.3-0.7 units/ml Monitor platelets by anticoagulation protocol: Yes   Plan:  Resume heparin at 1050 units/hr on 9/6 @ 1300 Check heparin level 6-8 hours after restarting Daily heparin level, CBC  Vertis Kelch, PharmD PGY2 Cardiology Pharmacy Resident Phone [phone] 08/30/2019       2:28 PM  Please check [email or web address] for unit-specific pharmacist phone numbers

## 2019-08-30 NOTE — Progress Notes (Addendum)
Progress Note  Patient Name: [name] Date of Encounter: 08/30/2019  Primary Cardiologist: Carlyle Dolly, MD   Subjective   Feels good this morning status post temporary transvenous pacemaker insertion for complete heart block yesterday evening by Dr. Burt Knack.  He denies chest pain or shortness of breath.  He is intermittently pacing but has sinus rhythm underneath that.  Inpatient Medications    Scheduled Meds: . amitriptyline  75 mg Oral QHS  . aspirin  81 mg Oral Pre-Cath  . aspirin EC  81 mg Oral Daily  . atorvastatin  80 mg Oral q1800  . cephALEXin  500 mg Oral TID  . Chlorhexidine Gluconate Cloth  6 each Topical Daily  . docusate sodium  100 mg Oral TID  . DULoxetine  30 mg Oral Daily  . finasteride  5 mg Oral Daily  . hydroxychloroquine  200 mg Oral Daily  . insulin aspart  0-15 Units Subcutaneous TID WC  . insulin aspart  0-5 Units Subcutaneous QHS  . methadone  10 mg Oral Q6H  . polyethylene glycol  17 g Oral BID  . predniSONE  5 mg Oral Q breakfast  . pregabalin  25 mg Oral QHS  . sodium chloride flush  3 mL Intravenous Once  . sodium chloride flush  3 mL Intravenous Q12H  . tamsulosin  0.4 mg Oral QHS  . torsemide  20 mg Oral QODAY   Continuous Infusions: . sodium chloride    . sodium chloride 10 mL/hr at 08/30/19 0700  . sodium chloride 50 mL/hr at 08/30/19 0700  . heparin 950 Units/hr (08/30/19 0700)   PRN Meds: sodium chloride, sodium chloride, acetaminophen, clonazePAM, methadone, nitroGLYCERIN, ondansetron (ZOFRAN) IV, sodium chloride flush   Vital Signs    Vitals:   08/30/19 0557 08/30/19 0600 08/30/19 0700 08/30/19 0740  BP:  139/64 (!) 141/59 (!) 141/59  Pulse: 71 70    Resp: 20 20 17    Temp:   98 F (36.7 C)   TempSrc:   Oral   SpO2: 100% 94%    Weight:      Height:        Intake/Output Summary (Last 24 hours) at 08/30/2019 0855 Last data filed at 08/30/2019 0700 Gross per 24 hour  Intake 390.92 ml  Output -  Net 390.92 ml    Last 3 Weights 08/30/2019 08/29/2019 08/29/2019  Weight (lbs) 173 lb 1 oz 180 lb 180 lb  Weight (kg) 78.5 kg 81.647 kg 81.647 kg      Telemetry    Intermittent paced rhythm with underlying sinus rhythm.- Personally Reviewed  ECG    Sinus rhythm with demand pacing- Personally Reviewed  Physical Exam   GEN: No acute distress.   Neck: No JVD Cardiac: RRR, no murmurs, rubs, or gallops.  Respiratory: Clear to auscultation bilaterally. GI: Soft, nontender, non-distended  MS: No edema; No deformity. Neuro:  Nonfocal  Psych: Normal affect  Extremities: Permanent transvenous pacemaker in right common femoral vein appears stable without hematoma   Labs    High Sensitivity Troponin:   Recent Labs  Lab 08/29/19 1545 08/29/19 1727 08/29/19 1751 08/29/19 2004  TROPONINIHS 505* 544* 528* 427*      Chemistry Recent Labs  Lab 08/29/19 1545 08/29/19 1751 08/30/19 0215  NA 133*  --  135  K 5.6*  --  4.9  CL 99  --  102  CO2 20*  --  24  GLUCOSE 249*  --  166*  BUN 44*  --  43*  CREATININE 2.77* 2.91* 2.32*  CALCIUM 9.6  --  9.0  GFRNONAA 22* 21* 28*  GFRAA 26* 24* 32*  ANIONGAP 14  --  9     Hematology Recent Labs  Lab 08/29/19 1545 08/29/19 1751 08/30/19 0215  WBC 12.0* 9.9 7.8  RBC 3.53* 3.10* 2.91*  HGB 10.6* 9.5* 8.9*  HCT 34.5* 30.1* 27.7*  MCV 97.7 97.1 95.2  MCH 30.0 30.6 30.6  MCHC 30.7 31.6 32.1  RDW 14.0 14.1 13.9  PLT 138* 113* 96*    BNPNo results for input(s): BNP, PROBNP in the last 168 hours.   DDimer No results for input(s): DDIMER in the last 168 hours.   Radiology    Dg Chest Portable 1 View  Result Date: 08/29/2019 CLINICAL DATA:  Chest pain. EXAM: PORTABLE CHEST 1 VIEW COMPARISON:  July 01, 2019 FINDINGS: External pacing pads are in place. Prior postsurgical changes from CABG. The cardiac silhouette is enlarged. Low lung volumes. Right lower lobe atelectasis versus airspace consolidation. Small right pleural effusion. Osteoarthritic changes  of bilateral shoulders. IMPRESSION: Low lung volumes with right lower lobe atelectasis versus airspace consolidation and small right pleural effusion. Apparent enlargement of the cardiac silhouette. Electronically Signed   By: Fidela Salisbury M.D.   On: 08/29/2019 16:11    Cardiac Studies   None  Patient Profile     68 y.o. male status post CABG 2016 at [facility].  Other problems include PVD status post BKA, disabling rheumatoid arthritis, hypertension hyperlipidemia.  He does have severe aortic stenosis by 2D echo in July of this year with normal LV function.  He was seen by Dr. Angelena Form in the office for TAVR work-up and was complaining of chest pain.  He was sent to the emergency room for admission with anticipation of performing right left heart cath.  Unfortunately, in the emergency room EKG revealed him to be in complete heart block.  Dr. Burt Knack urgently placed a temporary transvenous pacemaker last night.  Assessment & Plan    1: Coronary artery disease- history of CAD status post CABG at [facility] in 2016.  He has been complaining of chest pain.  He saw Dr. Angelena Form in the office who plan to do a right left heart cath on him however because of chronic renal insufficiency with a serum creatinine initially of 2.6 and now 2.3 this will need to be postponed until he approaches his baseline of 1.8.  He is on IV heparin.  2: Complete heart block- patient had narrow complex complete heart block with ventricular spots in the 30s yesterday afternoon.  A temporary transvenous pacemaker was placed by Dr. Burt Knack.  Appears to be capturing and demand pacing.  Underlying rhythm this morning appears to be sinus.  He has had left bundle branch block in the past.  I am concerned about pulling his pacer and him going back into complete heart block.  We will asked the EP team to evaluate this morning for consideration of placement of a permanent transvenous pacemaker  3: Chronic renal  insufficiency- serum creatinine runs in the 1 8 range at baseline.  Initially he was 2.9 yesterday, 2.3 this morning getting gently hydrated.  At this point, given the lack of unstable symptoms and chest pain I prefer to continue to gently hydrate him, follow his renal function and perform elective right and left heart cath on him next week when his renal function approaches baseline.  For questions or updates, please contact [facility] Please consult [email or web address] for contact info under        Signed, Quay Burow, MD  08/30/2019, 8:55 AM     Addendum: Mr. Bartnick had a permanent transvenous pacemaker placed this afternoon by Dr. Lovena Le successfully.  Agree with holding off on IV heparin until Sunday afternoon to minimize the chance of developing a pocket hematoma.  Lorretta Harp, M.D., [place], [facility], [place] [address] [place], [place]  [address]  [phone] 08/30/2019 2:13 PM

## 2019-08-30 NOTE — Progress Notes (Signed)
Orthopedic Tech Progress Note Patient Details:  [name] Apr 29, 1951 [id number] RN said patient has on arm sling Patient ID: [name], male   DOB: November 17, 1951, 68 y.o.   MRN: [id number]   Janit Pagan 08/30/2019, 2:29 PM

## 2019-08-31 ENCOUNTER — Inpatient Hospital Stay (HOSPITAL_COMMUNITY): Payer: Medicare HMO

## 2019-08-31 DIAGNOSIS — N183 Chronic kidney disease, stage 3 (moderate): Secondary | ICD-10-CM

## 2019-08-31 DIAGNOSIS — I350 Nonrheumatic aortic (valve) stenosis: Secondary | ICD-10-CM

## 2019-08-31 DIAGNOSIS — I2510 Atherosclerotic heart disease of native coronary artery without angina pectoris: Secondary | ICD-10-CM

## 2019-08-31 LAB — CBC
HCT: 31.2 % — ABNORMAL LOW (ref 39.0–52.0)
Hemoglobin: 9.8 g/dL — ABNORMAL LOW (ref 13.0–17.0)
MCH: 30.5 pg (ref 26.0–34.0)
MCHC: 31.4 g/dL (ref 30.0–36.0)
MCV: 97.2 fL (ref 80.0–100.0)
Platelets: 76 10*3/uL — ABNORMAL LOW (ref 150–400)
RBC: 3.21 MIL/uL — ABNORMAL LOW (ref 4.22–5.81)
RDW: 13.7 % (ref 11.5–15.5)
WBC: 5.2 10*3/uL (ref 4.0–10.5)
nRBC: 0 % (ref 0.0–0.2)

## 2019-08-31 LAB — GLUCOSE, CAPILLARY
Glucose-Capillary: 205 mg/dL — ABNORMAL HIGH (ref 70–99)
Glucose-Capillary: 155 mg/dL — ABNORMAL HIGH (ref 70–99)
Glucose-Capillary: 230 mg/dL — ABNORMAL HIGH (ref 70–99)
Glucose-Capillary: 133 mg/dL — ABNORMAL HIGH (ref 70–99)

## 2019-08-31 LAB — BASIC METABOLIC PANEL
Anion gap: 10 (ref 5–15)
BUN: 36 mg/dL — ABNORMAL HIGH (ref 8–23)
CO2: 23 mmol/L (ref 22–32)
Calcium: 9 mg/dL (ref 8.9–10.3)
Chloride: 105 mmol/L (ref 98–111)
Creatinine, Ser: 1.6 mg/dL — ABNORMAL HIGH (ref 0.61–1.24)
GFR calc Af Amer: 50 mL/min — ABNORMAL LOW (ref >60)
GFR calc non Af Amer: 43 mL/min — ABNORMAL LOW (ref >60)
Glucose, Bld: 128 mg/dL — ABNORMAL HIGH (ref 70–99)
Potassium: 4.5 mmol/L (ref 3.5–5.1)
Sodium: 138 mmol/L (ref 135–145)

## 2019-08-31 NOTE — Progress Notes (Signed)
Progress Note  Patient Name: [name] Date of Encounter: 08/31/2019  Primary Cardiologist:   Carlyle Dolly, MD   Subjective   No complaints of pain or SOB.   Inpatient Medications    Scheduled Meds: . amitriptyline  75 mg Oral QHS  . aspirin EC  81 mg Oral Daily  . atorvastatin  80 mg Oral q1800  . Chlorhexidine Gluconate Cloth  6 each Topical Daily  . docusate sodium  100 mg Oral TID  . DULoxetine  30 mg Oral Daily  . finasteride  5 mg Oral Daily  . hydroxychloroquine  200 mg Oral Daily  . insulin aspart  0-15 Units Subcutaneous TID WC  . insulin aspart  0-5 Units Subcutaneous QHS  . methadone  10 mg Oral Q6H  . polyethylene glycol  17 g Oral BID  . predniSONE  5 mg Oral Q breakfast  . pregabalin  25 mg Oral QHS  . sodium chloride flush  3 mL Intravenous Q12H  . tamsulosin  0.4 mg Oral QHS  . torsemide  20 mg Oral QODAY   Continuous Infusions: . sodium chloride 10 mL/hr at 08/30/19 0800  . [START ON 09/01/2019] heparin     PRN Meds: sodium chloride, acetaminophen, clonazePAM, methadone, nitroGLYCERIN, ondansetron (ZOFRAN) IV   Vital Signs    Vitals:   08/31/19 0300 08/31/19 0500 08/31/19 0645 08/31/19 0700  BP:    130/63  Pulse:    72  Resp:    16  Temp: 97.6 F (36.4 C)  (!) 97.5 F (36.4 C)   TempSrc: Oral  Oral   SpO2:    97%  Weight:  76.2 kg    Height:        Intake/Output Summary (Last 24 hours) at 08/31/2019 0752 Last data filed at 08/31/2019 0600 Gross per 24 hour  Intake 221.9 ml  Output 1575 ml  Net -1353.1 ml   Filed Weights   08/29/19 1525 08/30/19 0500 08/31/19 0500  Weight: 81.6 kg 78.5 kg 76.2 kg    Telemetry    NSR, demand ventricular pacing. Rare ectopy - Personally Reviewed  ECG    NSR, rate 80, demand ventricular pacing with old inferior infarct.  - Personally Reviewed  Physical Exam   GEN: No acute distress.   Neck: No  JVD Cardiac: RRR, no murmurs, rubs, or gallops.  Respiratory: Clear  to auscultation  bilaterally. GI: Soft, nontender, non-distended  MS: No  edema; No deformity.  Chest pacer dressing without bleeding  Neuro:  Nonfocal  Psych: Normal affect   Labs    Chemistry Recent Labs  Lab 08/29/19 1545 08/29/19 1751 08/30/19 0215 08/31/19 0335  NA 133*  --  135 138  K 5.6*  --  4.9 4.5  CL 99  --  102 105  CO2 20*  --  24 23  GLUCOSE 249*  --  166* 128*  BUN 44*  --  43* 36*  CREATININE 2.77* 2.91* 2.32* 1.60*  CALCIUM 9.6  --  9.0 9.0  GFRNONAA 22* 21* 28* 43*  GFRAA 26* 24* 32* 50*  ANIONGAP 14  --  9 10     Hematology Recent Labs  Lab 08/29/19 1751 08/30/19 0215 08/31/19 0335  WBC 9.9 7.8 5.2  RBC 3.10* 2.91* 3.21*  HGB 9.5* 8.9* 9.8*  HCT 30.1* 27.7* 31.2*  MCV 97.1 95.2 97.2  MCH 30.6 30.6 30.5  MCHC 31.6 32.1 31.4  RDW 14.1 13.9 13.7  PLT 113* 96* 76*  Cardiac EnzymesNo results for input(s): TROPONINI in the last 168 hours. No results for input(s): TROPIPOC in the last 168 hours.   BNPNo results for input(s): BNP, PROBNP in the last 168 hours.   DDimer No results for input(s): DDIMER in the last 168 hours.   Radiology    Dg Chest Portable 1 View  Result Date: 08/29/2019 CLINICAL DATA:  Chest pain. EXAM: PORTABLE CHEST 1 VIEW COMPARISON:  July 01, 2019 FINDINGS: External pacing pads are in place. Prior postsurgical changes from CABG. The cardiac silhouette is enlarged. Low lung volumes. Right lower lobe atelectasis versus airspace consolidation. Small right pleural effusion. Osteoarthritic changes of bilateral shoulders. IMPRESSION: Low lung volumes with right lower lobe atelectasis versus airspace consolidation and small right pleural effusion. Apparent enlargement of the cardiac silhouette. Electronically Signed   By: Fidela Salisbury M.D.   On: 08/29/2019 16:11    Cardiac Studies   ECHO  7/20   1. The left ventricle has normal systolic function with an ejection fraction of 60-65%. The cavity size was normal. Left ventricular diastolic  Doppler parameters are consistent with pseudonormalization.  2. The right ventricle has normal systolic function. The cavity was normal. There is no increase in right ventricular wall thickness.  3. The mitral valve is abnormal. Mild thickening of the mitral valve leaflet. There is moderate mitral annular calcification present.  4. The aortic valve is abnormal. Severely thickening of the aortic valve. Severe calcifcation of the aortic valve. Aortic valve regurgitation is mild to moderate by color flow Doppler. Severe stenosis of the aortic valve.  5. The aortic root and ascending aorta are normal in size and structure.  6. The interatrial septum was not assessed.   Patient Profile     68 y.o. male  status post CABG 2016 at [facility].  Other problems include PVD status post BKA, disabling rheumatoid arthritis, hypertension hyperlipidemia.  He does have severe aortic stenosis by 2D echo in July of this year with normal LV function.  He was seen by Dr. Angelena Form in the office for TAVR work-up and was complaining of chest pain.  He was sent to the emergency room for admission with anticipation of performing right left heart cath.  Unfortunately, in the emergency room EKG revealed him to be in complete heart block.  Dr. Burt Knack urgently placed a temporary transvenous pacemaker and the patient is now post permanent .   Assessment & Plan    CAD:    Plan is eventual right and left heart cath next week pending his creat level.  OK to move to telemetry.   CHB:  Now status post pacemaker.   CXR personally reviewed, final read pending.  No pneumothorax or acute finding.  Pacer leads in place.    CKD III:     Creat is improved and near baseline.  OK to continue low dose Torsemide.    Follow creat.     SEVERE AS:  Cath next week as above.   DM:  Blood sugars OK.    For questions or updates, please contact [place] Please consult [email or web address] for contact info under Cardiology/STEMI.    Signed, Minus Breeding, MD  08/31/2019, 7:52 AM

## 2019-08-31 NOTE — Progress Notes (Signed)
ANTICOAGULATION CONSULT NOTE - Follow Up Consult  Pharmacy Consult for heparin  Indication: chest pain/ACS  Allergies  Allergen Reactions  . Sulfa Antibiotics Rash    Patient Measurements: Height: 5\' 9"  (175.3 cm) Weight: 167 lb 15.9 oz (76.2 kg) IBW/kg (Calculated) : 70.7 Heparin Dosing Weight: 80kg  Vital Signs: Temp: 98 F (36.7 C) (09/05 0815) Temp Source: Oral (09/05 0815) BP: 137/46 (09/05 1000) Pulse Rate: 88 (09/05 1000)  Labs: Recent Labs    08/29/19 1727 08/29/19 1751 08/29/19 2004 08/30/19 0215 08/30/19 0933 08/31/19 0335  HGB  --  9.5*  --  8.9*  --  9.8*  HCT  --  30.1*  --  27.7*  --  31.2*  PLT  --  113*  --  96*  --  76*  HEPARINUNFRC  --   --   --   --  0.23*  --   CREATININE  --  2.91*  --  2.32*  --  1.60*  TROPONINIHS 544* 528* 427*  --   --   --     Estimated Creatinine Clearance: 43.6 mL/min (A) (by C-G formula based on SCr of 1.6 mg/dL (H)).   Medical History: Past Medical History:  Diagnosis Date  . Aortic stenosis   . Chronic back pain   . Chronic combined systolic (congestive) and diastolic (congestive) heart failure (HCC)    a. EF 30-35% in 2016 b. at 30-35% by echo in 08/2016 c. 06/2019: echo showing EF of 60-65% but found to have severe AS.   [name] Chronic neck pain   . Coronary artery disease    a. s/p CABG in 06/2015 with LIMA-LAD, SVG-PDA, SVG-Intermediate  . Diabetes mellitus   . Diabetic neuropathy ([place])   . Hyperkalemia   . Hypertension   . Loculated pleural effusion 09/09/2016  . Pancytopenia ([place]) 08/13/2014  . 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  . Rheumatoid arthritis(714.0)   . Seizure ([place]) 08/13/2014   Pt denies   Assessment: 68 year old male s/p temp pacer wire in rt groin placed on 9/3. Heparin ordered for r/o acs prior to cath and continued post pacer placement. No plans for cath today since renal function remains elevated from baseline. Hgb up to 9.8 today,  baseline pltc low, down to 76 today, no bleeding noted.    Heparin level previously subtherapeutic at 0.22 at a rate of 950 units/hr  Now s/p PPM by EP on 9/4. Plan to still go to cath for chest pain workup, so heparin to resume until cath is able to be completed. EP recommended resuming heparin on 9/6 @ 1300  Goal of Therapy:  Heparin level 0.3-0.7 units/ml Monitor platelets by anticoagulation protocol: Yes   Plan:  Resume heparin at 1050 units/hr on 9/6 @ 1300 Check heparin level 6-8 hours after restarting Daily heparin level, CBC Follow up cath next week  Vertis Kelch, PharmD PGY2 Cardiology Pharmacy Resident Phone [phone] 08/31/2019       10:33 AM  Please check [email or web address] for unit-specific pharmacist phone numbers

## 2019-08-31 NOTE — Progress Notes (Signed)
Report called and pt transferred to [id number]. VSS.  Lucius Conn, RN

## 2019-09-01 LAB — CBC
HCT: 28.7 % — ABNORMAL LOW (ref 39.0–52.0)
Hemoglobin: 9.2 g/dL — ABNORMAL LOW (ref 13.0–17.0)
MCH: 30.6 pg (ref 26.0–34.0)
MCHC: 32.1 g/dL (ref 30.0–36.0)
MCV: 95.3 fL (ref 80.0–100.0)
Platelets: 98 10*3/uL — ABNORMAL LOW (ref 150–400)
RBC: 3.01 MIL/uL — ABNORMAL LOW (ref 4.22–5.81)
RDW: 13.8 % (ref 11.5–15.5)
WBC: 5.4 10*3/uL (ref 4.0–10.5)
nRBC: 0 % (ref 0.0–0.2)

## 2019-09-01 LAB — BASIC METABOLIC PANEL
Anion gap: 9 (ref 5–15)
BUN: 31 mg/dL — ABNORMAL HIGH (ref 8–23)
CO2: 24 mmol/L (ref 22–32)
Calcium: 8.6 mg/dL — ABNORMAL LOW (ref 8.9–10.3)
Chloride: 104 mmol/L (ref 98–111)
Creatinine, Ser: 1.44 mg/dL — ABNORMAL HIGH (ref 0.61–1.24)
GFR calc Af Amer: 57 mL/min — ABNORMAL LOW (ref >60)
GFR calc non Af Amer: 49 mL/min — ABNORMAL LOW (ref >60)
Glucose, Bld: 173 mg/dL — ABNORMAL HIGH (ref 70–99)
Potassium: 4.2 mmol/L (ref 3.5–5.1)
Sodium: 137 mmol/L (ref 135–145)

## 2019-09-01 LAB — GLUCOSE, CAPILLARY
Glucose-Capillary: 173 mg/dL — ABNORMAL HIGH (ref 70–99)
Glucose-Capillary: 198 mg/dL — ABNORMAL HIGH (ref 70–99)
Glucose-Capillary: 137 mg/dL — ABNORMAL HIGH (ref 70–99)
Glucose-Capillary: 105 mg/dL — ABNORMAL HIGH (ref 70–99)

## 2019-09-01 LAB — HEPARIN LEVEL (UNFRACTIONATED): Heparin Unfractionated: 0.15 IU/mL — ABNORMAL LOW (ref 0.30–0.70)

## 2019-09-01 NOTE — Progress Notes (Signed)
ANTICOAGULATION CONSULT NOTE - Follow Up Consult  Pharmacy Consult for heparin  Indication: chest pain/ACS  Allergies  Allergen Reactions  . Sulfa Antibiotics Rash    Patient Measurements: Height: 5\' 7"  (170.2 cm) Weight: 164 lb 0.4 oz (74.4 kg) IBW/kg (Calculated) : 66.1 Heparin Dosing Weight: 80kg  Vital Signs: Temp: 97.6 F (36.4 C) (09/06 2046) Temp Source: Oral (09/06 2046) BP: 124/56 (09/06 2046) Pulse Rate: 82 (09/06 2046)  Labs: Recent Labs    08/30/19 0215 08/30/19 0933 08/31/19 0335 09/01/19 0722 09/01/19 2005  HGB 8.9*  --  9.8* 9.2*  --   HCT 27.7*  --  31.2* 28.7*  --   PLT 96*  --  76* 98*  --   HEPARINUNFRC  --  0.23*  --   --  0.15*  CREATININE 2.32*  --  1.60* 1.44*  --     Estimated Creatinine Clearance: 45.3 mL/min (A) (by C-G formula based on SCr of 1.44 mg/dL (H)).   Medical History: Past Medical History:  Diagnosis Date  . Aortic stenosis   . Chronic back pain   . Chronic combined systolic (congestive) and diastolic (congestive) heart failure (HCC)    a. EF 30-35% in 2016 b. at 30-35% by echo in 08/2016 c. 06/2019: echo showing EF of 60-65% but found to have severe AS.   [name] Chronic neck pain   . Coronary artery disease    a. s/p CABG in 06/2015 with LIMA-LAD, SVG-PDA, SVG-Intermediate  . Diabetes mellitus   . Diabetic neuropathy ([place])   . Hyperkalemia   . Hypertension   . Loculated pleural effusion 09/09/2016  . Pancytopenia ([place]) 08/13/2014  . 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  . Rheumatoid arthritis(714.0)   . Seizure ([place]) 08/13/2014   Pt denies   Assessment: 68 year old male s/p temp pacer wire in rt groin placed on 9/3. Heparin ordered for r/o acs prior to cath and continued post pacer placement. No plans for cath today since renal function remains elevated from baseline. Hgb up to 9.8 today, baseline pltc low, down to 76 today, no bleeding noted.   Pt now s/p PPM on 9/4 and  heparin resumed 9/6 afternoon. Planning for cath on Tuesday 9/8. Initial heparin level after resuming low at 0.15   Goal of Therapy:  Heparin level 0.3-0.7 units/ml Monitor platelets by anticoagulation protocol: Yes   Plan:  Increase heparin to 1250 units/hr Recheck heparin level with morning labs   Arrie Senate, PharmD, BCPS Clinical Pharmacist [phone] Please check AMION for all [place] numbers 09/01/2019

## 2019-09-01 NOTE — Progress Notes (Signed)
Progress Note  Patient Name: [name] Date of Encounter: 09/01/2019  Primary Cardiologist:   Carlyle Dolly, MD Patient Profile     68 y.o. male  status post CABG 2016 at [facility].  Other problems include PVD status post BKA, disabling rheumatoid arthritis, hypertension hyperlipidemia.  He does have severe aortic stenosis by 2D echo in July of this year with normal LV function.  He was seen by Dr. Angelena Form in the office for TAVR work-up and was complaining of chest pain.  He was sent to the emergency room for admission with anticipation of performing right left heart cath.  Unfortunately, in the emergency room EKG revealed him to be in complete heart block.  Dr. Burt Knack urgently placed a temporary transvenous pacemaker and the patient is now post permanent .  Subjective     Without complaint this am For R/L HC this week Inpatient Medications    Scheduled Meds: . amitriptyline  75 mg Oral QHS  . aspirin EC  81 mg Oral Daily  . atorvastatin  80 mg Oral q1800  . Chlorhexidine Gluconate Cloth  6 each Topical Daily  . docusate sodium  100 mg Oral TID  . DULoxetine  30 mg Oral Daily  . finasteride  5 mg Oral Daily  . hydroxychloroquine  200 mg Oral Daily  . insulin aspart  0-15 Units Subcutaneous TID WC  . insulin aspart  0-5 Units Subcutaneous QHS  . methadone  10 mg Oral Q6H  . polyethylene glycol  17 g Oral BID  . predniSONE  5 mg Oral Q breakfast  . pregabalin  25 mg Oral QHS  . sodium chloride flush  3 mL Intravenous Q12H  . tamsulosin  0.4 mg Oral QHS  . torsemide  20 mg Oral QODAY   Continuous Infusions: . sodium chloride 10 mL/hr at 08/30/19 0800  . heparin     PRN Meds: sodium chloride, acetaminophen, clonazePAM, methadone, nitroGLYCERIN, ondansetron (ZOFRAN) IV   Vital Signs    Vitals:   08/31/19 1631 09/01/19 0011 09/01/19 0617 09/01/19 0739  BP: 121/68 (!) 92/59 125/65 123/62  Pulse: 84 78 75 76  Resp: 18 15 14 18   Temp: 99.5 F (37.5 C) 97.8 F  (36.6 C) 98.4 F (36.9 C) 98.2 F (36.8 C)  TempSrc: Oral Oral Oral Oral  SpO2: 95% 96% 94% 96%  Weight:   74.4 kg   Height:        Intake/Output Summary (Last 24 hours) at 09/01/2019 0843 Last data filed at 08/31/2019 1310 Gross per 24 hour  Intake -  Output 350 ml  Net -350 ml   Filed Weights   08/31/19 0500 08/31/19 1425 09/01/19 0617  Weight: 76.2 kg 74.3 kg 74.4 kg    Telemetry    Sinus and Apacing with intermittent intrinsic conduction and P-synchronous/ AV  pacing - Personally Reviewed  ECG       Physical Exam  BP 123/62 (BP Location: Right Arm)   Pulse 76   Temp 98.2 F (36.8 C) (Oral)   Resp 18   Ht 5\' 7"  (1.702 m)   Wt 74.4 kg   SpO2 96%   BMI 25.69 kg/m  Well developed and nourished in no acute distress HENT normal Neck supple  Pocket without  hematoma, swelling or tenderness  Clear Regular rate and rhythm, 3/6 high pitched systolic  Abd-soft with active BS No Clubbing cyanosis edema deformed hands from arthritis s/p L BKA  Skin-warm and dry A &  Oriented  Grossly normal sensory and motor function    Labs    Chemistry Recent Labs  Lab 08/30/19 0215 08/31/19 0335 09/01/19 0722  NA 135 138 137  K 4.9 4.5 4.2  CL 102 105 104  CO2 24 23 24   GLUCOSE 166* 128* 173*  BUN 43* 36* 31*  CREATININE 2.32* 1.60* 1.44*  CALCIUM 9.0 9.0 8.6*  GFRNONAA 28* 43* 49*  GFRAA 32* 50* 57*  ANIONGAP 9 10 9      Hematology Recent Labs  Lab 08/30/19 0215 08/31/19 0335 09/01/19 0722  WBC 7.8 5.2 5.4  RBC 2.91* 3.21* 3.01*  HGB 8.9* 9.8* 9.2*  HCT 27.7* 31.2* 28.7*  MCV 95.2 97.2 95.3  MCH 30.6 30.5 30.6  MCHC 32.1 31.4 32.1  RDW 13.9 13.7 13.8  PLT 96* 76* 98*    Cardiac EnzymesNo results for input(s): TROPONINI in the last 168 hours. No results for input(s): TROPIPOC in the last 168 hours.   BNPNo results for input(s): BNP, PROBNP in the last 168 hours.   DDimer No results for input(s): DDIMER in the last 168 hours.   Radiology    Dg  Chest 2 View  Result Date: 08/31/2019 CLINICAL DATA:  Pacemaker. Pt cannot lift both arms EXAM: CHEST - 2 VIEW COMPARISON:  08/29/2019 FINDINGS: Patient has new LEFT-sided transvenous pacemaker with leads to the RIGHT atrium and RIGHT ventricle. No pneumothorax. Heart size is mildly enlarged. Status post median sternotomy and CABG. Patchy opacity at the RIGHT lung base appear stable. No pulmonary edema. Visualized bowel gas pattern is nonobstructed. Chronic changes in both shoulders. IMPRESSION: Interval placement of LEFT-sided transvenous pacemaker. No pneumothorax. Electronically Signed   By: Nolon Nations M.D.   On: 08/31/2019 08:54    Cardiac Studies   ECHO  7/20   1. The left ventricle has normal systolic function with an ejection fraction of 60-65%. The cavity size was normal. Left ventricular diastolic Doppler parameters are consistent with pseudonormalization.  2. The right ventricle has normal systolic function. The cavity was normal. There is no increase in right ventricular wall thickness.  3. The mitral valve is abnormal. Mild thickening of the mitral valve leaflet. There is moderate mitral annular calcification present.  4. The aortic valve is abnormal. Severely thickening of the aortic valve. Severe calcifcation of the aortic valve. Aortic valve regurgitation is mild to moderate by color flow Doppler. Severe stenosis of the aortic valve.  5. The aortic root and ascending aorta are normal in size and structure.  6. The interatrial septum was not assessed.      Assessment & Plan    CAD:     CHB:/Pacemaker   CKD III:       SEVERE AS:     Anemia chronic  For R/LHCTues am S/p  Pacer normal function and stable leads on Chest Xray personally reviewed   Renal function much improved  2.3>>1.4       Signed, Virl Axe, MD  09/01/2019, 8:43 AM

## 2019-09-02 LAB — BASIC METABOLIC PANEL
Anion gap: 9 (ref 5–15)
BUN: 31 mg/dL — ABNORMAL HIGH (ref 8–23)
CO2: 26 mmol/L (ref 22–32)
Calcium: 8.9 mg/dL (ref 8.9–10.3)
Chloride: 101 mmol/L (ref 98–111)
Creatinine, Ser: 1.55 mg/dL — ABNORMAL HIGH (ref 0.61–1.24)
GFR calc Af Amer: 52 mL/min — ABNORMAL LOW (ref >60)
GFR calc non Af Amer: 45 mL/min — ABNORMAL LOW (ref >60)
Glucose, Bld: 221 mg/dL — ABNORMAL HIGH (ref 70–99)
Potassium: 4.3 mmol/L (ref 3.5–5.1)
Sodium: 136 mmol/L (ref 135–145)

## 2019-09-02 LAB — CBC
HCT: 30.7 % — ABNORMAL LOW (ref 39.0–52.0)
Hemoglobin: 9.7 g/dL — ABNORMAL LOW (ref 13.0–17.0)
MCH: 30.2 pg (ref 26.0–34.0)
MCHC: 31.6 g/dL (ref 30.0–36.0)
MCV: 95.6 fL (ref 80.0–100.0)
Platelets: 110 10*3/uL — ABNORMAL LOW (ref 150–400)
RBC: 3.21 MIL/uL — ABNORMAL LOW (ref 4.22–5.81)
RDW: 13.5 % (ref 11.5–15.5)
WBC: 6.2 10*3/uL (ref 4.0–10.5)
nRBC: 0 % (ref 0.0–0.2)

## 2019-09-02 LAB — GLUCOSE, CAPILLARY
Glucose-Capillary: 133 mg/dL — ABNORMAL HIGH (ref 70–99)
Glucose-Capillary: 182 mg/dL — ABNORMAL HIGH (ref 70–99)
Glucose-Capillary: 213 mg/dL — ABNORMAL HIGH (ref 70–99)
Glucose-Capillary: 149 mg/dL — ABNORMAL HIGH (ref 70–99)

## 2019-09-02 LAB — HEPARIN LEVEL (UNFRACTIONATED)
Heparin Unfractionated: 0.46 IU/mL (ref 0.30–0.70)
Heparin Unfractionated: 0.65 IU/mL (ref 0.30–0.70)

## 2019-09-02 MED ORDER — SODIUM CHLORIDE 0.9% FLUSH: 3.0000 mL | INTRAVENOUS | Status: DC | PRN

## 2019-09-02 MED ORDER — SODIUM CHLORIDE 0.9 % IV SOLN
INTRAVENOUS | Status: DC
  Administered 2019-09-02: 23:00:00 via INTRAVENOUS

## 2019-09-02 MED ORDER — ASPIRIN 81 MG PO CHEW
81.0000 mg | CHEWABLE_TABLET | ORAL | Status: AC
  Administered 2019-09-03: 81 mg via ORAL
  Filled 2019-09-02: qty 1

## 2019-09-02 MED ORDER — SODIUM CHLORIDE 0.9% FLUSH: 3.0000 mL | Freq: Two times a day (BID) | INTRAVENOUS | Status: DC

## 2019-09-02 MED ORDER — SODIUM CHLORIDE 0.9 % IV SOLN: 250.0000 mL | INTRAVENOUS | Status: DC | PRN

## 2019-09-02 NOTE — Progress Notes (Signed)
ANTICOAGULATION CONSULT NOTE - Follow Up Consult  Pharmacy Consult for heparin  Indication: chest pain/ACS  Allergies  Allergen Reactions  . Sulfa Antibiotics Rash    Patient Measurements: Height: 5\' 7"  (170.2 cm) Weight: 168 lb 10.4 oz (76.5 kg) IBW/kg (Calculated) : 66.1 Heparin Dosing Weight: 74.3 kg  Vital Signs: Temp: 98.3 F (36.8 C) (09/07 1133) Temp Source: Oral (09/07 1133) BP: 111/51 (09/07 1133) Pulse Rate: 79 (09/07 1133)  Labs: Recent Labs    08/31/19 0335 09/01/19 0722 09/01/19 2005 09/02/19 0310 09/02/19 0951  HGB 9.8* 9.2*  --  9.7*  --   HCT 31.2* 28.7*  --  30.7*  --   PLT 76* 98*  --  110*  --   HEPARINUNFRC  --   --  0.15* 0.46 0.65  CREATININE 1.60* 1.44*  --  1.55*  --     Estimated Creatinine Clearance: 42.1 mL/min (A) (by C-G formula based on SCr of 1.55 mg/dL (H)).   Medical History: Past Medical History:  Diagnosis Date  . Aortic stenosis   . Chronic back pain   . Chronic combined systolic (congestive) and diastolic (congestive) heart failure (HCC)    a. EF 30-35% in 2016 b. at 30-35% by echo in 08/2016 c. 06/2019: echo showing EF of 60-65% but found to have severe AS.   [name] Chronic neck pain   . Coronary artery disease    a. s/p CABG in 06/2015 with LIMA-LAD, SVG-PDA, SVG-Intermediate  . Diabetes mellitus   . Diabetic neuropathy ([place])   . Hyperkalemia   . Hypertension   . Loculated pleural effusion 09/09/2016  . Pancytopenia ([place]) 08/13/2014  . 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  . Rheumatoid arthritis(714.0)   . Seizure ([place]) 08/13/2014   Pt denies   Assessment: 68 year old male s/p temp pacer wire in rt groin placed on 9/3. Heparin ordered for r/o acs prior to cath and continued post pacer placement. Pt now s/p PPM on 9/4 and heparin resumed 9/6 afternoon. Planning for cath on Tuesday 9/8.   Heparin level at high end of therapeutic range at 0.65 on 1250 units/hr. H&H stable at  9.7/30.7 and plts are trending up at 110.   Goal of Therapy:  Heparin level 0.3-0.7 units/ml Monitor platelets by anticoagulation protocol: Yes   Plan:  Decrease heparin to 1200 units/hr Check daily heparin level  Monitor CBC and s/sx of bleeding     Thank you,   Eddie Candle, PharmD PGY-1 Pharmacy Resident   Please check amion for clinical pharmacist contact number

## 2019-09-02 NOTE — Plan of Care (Signed)
  Problem: Education: Goal: Knowledge of General Education information will improve Description Including pain rating scale, medication(s)/side effects and non-pharmacologic comfort measures Outcome: Progressing   Problem: Health Behavior/Discharge Planning: Goal: Ability to manage health-related needs will improve Outcome: Progressing   

## 2019-09-02 NOTE — Care Management Important Message (Signed)
Important Message  Patient Details  Name: [name] MRN: [id number] Date of Birth: [birth year]   Medicare Important Message Given:  Yes     Orbie Pyo 09/02/2019, 2:53 PM

## 2019-09-02 NOTE — Progress Notes (Signed)
ANTICOAGULATION CONSULT NOTE - Follow Up Consult  Pharmacy Consult for heparin Indication: chest pain/ACS  Labs: Recent Labs    08/30/19 0933 08/31/19 0335 09/01/19 0722 09/01/19 2005 09/02/19 0310  HGB  --  9.8* 9.2*  --   --   HCT  --  31.2* 28.7*  --   --   PLT  --  76* 98*  --   --   HEPARINUNFRC 0.23*  --   --  0.15* 0.46  CREATININE  --  1.60* 1.44*  --   --     Assessment/Plan:  68yo male therapeutic on heparin after rate change. Will continue gtt at current rate and confirm stable with additional level.   Wynona Neat, PharmD, BCPS  09/02/2019,4:44 AM

## 2019-09-02 NOTE — Plan of Care (Signed)

## 2019-09-02 NOTE — H&P (View-Only) (Signed)
Progress Note  Patient Name: [name] Date of Encounter: 09/02/2019  Primary Cardiologist: Carlyle Dolly, MD   Subjective   No chest pain or dyspnea.   Inpatient Medications    Scheduled Meds: . amitriptyline  75 mg Oral QHS  . aspirin EC  81 mg Oral Daily  . atorvastatin  80 mg Oral q1800  . Chlorhexidine Gluconate Cloth  6 each Topical Daily  . docusate sodium  100 mg Oral TID  . DULoxetine  30 mg Oral Daily  . finasteride  5 mg Oral Daily  . hydroxychloroquine  200 mg Oral Daily  . insulin aspart  0-15 Units Subcutaneous TID WC  . insulin aspart  0-5 Units Subcutaneous QHS  . methadone  10 mg Oral Q6H  . polyethylene glycol  17 g Oral BID  . predniSONE  5 mg Oral Q breakfast  . pregabalin  25 mg Oral QHS  . sodium chloride flush  3 mL Intravenous Q12H  . tamsulosin  0.4 mg Oral QHS  . torsemide  20 mg Oral QODAY   Continuous Infusions: . sodium chloride 10 mL/hr at 08/30/19 0800  . heparin 1,250 Units/hr (09/02/19 0241)   PRN Meds: sodium chloride, acetaminophen, clonazePAM, methadone, nitroGLYCERIN, ondansetron (ZOFRAN) IV   Vital Signs    Vitals:   09/02/19 0211 09/02/19 0226 09/02/19 0404 09/02/19 0407  BP: (!) 155/132 (!) 131/101  (!) 146/66  Pulse: 93 93  86  Resp: 19   18  Temp: 98.3 F (36.8 C)   98.3 F (36.8 C)  TempSrc: Oral   Oral  SpO2: 95%   95%  Weight:   76.5 kg   Height:        Intake/Output Summary (Last 24 hours) at 09/02/2019 0811 Last data filed at 09/02/2019 0618 Gross per 24 hour  Intake 1321.37 ml  Output 2450 ml  Net -1128.63 ml   Last 3 Weights 09/02/2019 09/01/2019 08/31/2019  Weight (lbs) 168 lb 10.4 oz 164 lb 0.4 oz 163 lb 12.8 oz  Weight (kg) 76.5 kg 74.4 kg 74.3 kg      Telemetry    Sinus - Personally Reviewed  ECG    No AM EKG - Personally Reviewed  Physical Exam   GEN: No acute distress.   Neck: No JVD Cardiac: RRR, no murmurs, rubs, or gallops.  Respiratory: Clear to auscultation bilaterally. GI: Soft,  nontender, non-distended  MS: No right LE edema, left BKA Neuro:  Nonfocal  Psych: Normal affect   Labs    High Sensitivity Troponin:   Recent Labs  Lab 08/29/19 1545 08/29/19 1727 08/29/19 1751 08/29/19 2004  TROPONINIHS 505* 544* 528* 427*      Chemistry Recent Labs  Lab 08/31/19 0335 09/01/19 0722 09/02/19 0310  NA 138 137 136  K 4.5 4.2 4.3  CL 105 104 101  CO2 23 24 26   GLUCOSE 128* 173* 221*  BUN 36* 31* 31*  CREATININE 1.60* 1.44* 1.55*  CALCIUM 9.0 8.6* 8.9  GFRNONAA 43* 49* 45*  GFRAA 50* 57* 52*  ANIONGAP 10 9 9      Hematology Recent Labs  Lab 08/31/19 0335 09/01/19 0722 09/02/19 0310  WBC 5.2 5.4 6.2  RBC 3.21* 3.01* 3.21*  HGB 9.8* 9.2* 9.7*  HCT 31.2* 28.7* 30.7*  MCV 97.2 95.3 95.6  MCH 30.5 30.6 30.2  MCHC 31.4 32.1 31.6  RDW 13.7 13.8 13.5  PLT 76* 98* 110*    BNPNo results for input(s): BNP, PROBNP in the last  168 hours.   DDimer No results for input(s): DDIMER in the last 168 hours.   Radiology    No results found.  Cardiac Studies     Patient Profile     68 y.o. male with h/o CAD s/p CABG, PAD s/p BKA, RA, HTN, HLD, severe aortic stenosis and complete heart block who was admitted last week with chest pain and complete heart block. He is now s/p pacemaker.   Assessment & Plan    1. CAD s/p CABG: Recent chest pains concerning for unstable angina In setting of complete heart block with mild elevation of troponin. Renal function is stable. Plan for right and left heart cath tomorrow. Continue ASA and statin. Continue IV heparin.   2. Moderate to Severe aortic stenosis: Right and left heart cath tomorrow then will proceed with workup for TAVR based on hemodynamics.   3. Complete heart block: s/p pacemaker.   For questions or updates, please contact [place] Please consult [email or web address] for contact info under        Signed, Lauree Chandler, MD  09/02/2019, 8:11 AM

## 2019-09-02 NOTE — Progress Notes (Signed)
Patient resting comfortably, wife at bedside, says she's ben allowed to stay his whole admission because pt gets confused sometimes.

## 2019-09-02 NOTE — Progress Notes (Signed)
Progress Note  Patient Name: [name] Date of Encounter: 09/02/2019  Primary Cardiologist: Carlyle Dolly, MD   Subjective   No chest pain or dyspnea.   Inpatient Medications    Scheduled Meds: . amitriptyline  75 mg Oral QHS  . aspirin EC  81 mg Oral Daily  . atorvastatin  80 mg Oral q1800  . Chlorhexidine Gluconate Cloth  6 each Topical Daily  . docusate sodium  100 mg Oral TID  . DULoxetine  30 mg Oral Daily  . finasteride  5 mg Oral Daily  . hydroxychloroquine  200 mg Oral Daily  . insulin aspart  0-15 Units Subcutaneous TID WC  . insulin aspart  0-5 Units Subcutaneous QHS  . methadone  10 mg Oral Q6H  . polyethylene glycol  17 g Oral BID  . predniSONE  5 mg Oral Q breakfast  . pregabalin  25 mg Oral QHS  . sodium chloride flush  3 mL Intravenous Q12H  . tamsulosin  0.4 mg Oral QHS  . torsemide  20 mg Oral QODAY   Continuous Infusions: . sodium chloride 10 mL/hr at 08/30/19 0800  . heparin 1,250 Units/hr (09/02/19 0241)   PRN Meds: sodium chloride, acetaminophen, clonazePAM, methadone, nitroGLYCERIN, ondansetron (ZOFRAN) IV   Vital Signs    Vitals:   09/02/19 0211 09/02/19 0226 09/02/19 0404 09/02/19 0407  BP: (!) 155/132 (!) 131/101  (!) 146/66  Pulse: 93 93  86  Resp: 19   18  Temp: 98.3 F (36.8 C)   98.3 F (36.8 C)  TempSrc: Oral   Oral  SpO2: 95%   95%  Weight:   76.5 kg   Height:        Intake/Output Summary (Last 24 hours) at 09/02/2019 0811 Last data filed at 09/02/2019 0618 Gross per 24 hour  Intake 1321.37 ml  Output 2450 ml  Net -1128.63 ml   Last 3 Weights 09/02/2019 09/01/2019 08/31/2019  Weight (lbs) 168 lb 10.4 oz 164 lb 0.4 oz 163 lb 12.8 oz  Weight (kg) 76.5 kg 74.4 kg 74.3 kg      Telemetry    Sinus - Personally Reviewed  ECG    No AM EKG - Personally Reviewed  Physical Exam   GEN: No acute distress.   Neck: No JVD Cardiac: RRR, no murmurs, rubs, or gallops.  Respiratory: Clear to auscultation bilaterally. GI: Soft,  nontender, non-distended  MS: No right LE edema, left BKA Neuro:  Nonfocal  Psych: Normal affect   Labs    High Sensitivity Troponin:   Recent Labs  Lab 08/29/19 1545 08/29/19 1727 08/29/19 1751 08/29/19 2004  TROPONINIHS 505* 544* 528* 427*      Chemistry Recent Labs  Lab 08/31/19 0335 09/01/19 0722 09/02/19 0310  NA 138 137 136  K 4.5 4.2 4.3  CL 105 104 101  CO2 23 24 26   GLUCOSE 128* 173* 221*  BUN 36* 31* 31*  CREATININE 1.60* 1.44* 1.55*  CALCIUM 9.0 8.6* 8.9  GFRNONAA 43* 49* 45*  GFRAA 50* 57* 52*  ANIONGAP 10 9 9      Hematology Recent Labs  Lab 08/31/19 0335 09/01/19 0722 09/02/19 0310  WBC 5.2 5.4 6.2  RBC 3.21* 3.01* 3.21*  HGB 9.8* 9.2* 9.7*  HCT 31.2* 28.7* 30.7*  MCV 97.2 95.3 95.6  MCH 30.5 30.6 30.2  MCHC 31.4 32.1 31.6  RDW 13.7 13.8 13.5  PLT 76* 98* 110*    BNPNo results for input(s): BNP, PROBNP in the last  168 hours.   DDimer No results for input(s): DDIMER in the last 168 hours.   Radiology    No results found.  Cardiac Studies     Patient Profile     68 y.o. male with h/o CAD s/p CABG, PAD s/p BKA, RA, HTN, HLD, severe aortic stenosis and complete heart block who was admitted last week with chest pain and complete heart block. He is now s/p pacemaker.   Assessment & Plan    1. CAD s/p CABG: Recent chest pains concerning for unstable angina In setting of complete heart block with mild elevation of troponin. Renal function is stable. Plan for right and left heart cath tomorrow. Continue ASA and statin. Continue IV heparin.   2. Moderate to Severe aortic stenosis: Right and left heart cath tomorrow then will proceed with workup for TAVR based on hemodynamics.   3. Complete heart block: s/p pacemaker.   For questions or updates, please contact [place] Please consult [email or web address] for contact info under        Signed, Lauree Chandler, MD  09/02/2019, 8:11 AM

## 2019-09-03 ENCOUNTER — Encounter (HOSPITAL_COMMUNITY): Payer: Self-pay | Admitting: Internal Medicine

## 2019-09-03 ENCOUNTER — Encounter (HOSPITAL_COMMUNITY)
Admission: EM | Disposition: A | Discharge status: Home / Self Care | Payer: Self-pay | Source: Home / Self Care | Attending: Cardiovascular Disease

## 2019-09-03 DIAGNOSIS — I25110 Atherosclerotic heart disease of native coronary artery with unstable angina pectoris: Secondary | ICD-10-CM

## 2019-09-03 DIAGNOSIS — I350 Nonrheumatic aortic (valve) stenosis: Secondary | ICD-10-CM

## 2019-09-03 DIAGNOSIS — Z950 Presence of cardiac pacemaker: Secondary | ICD-10-CM

## 2019-09-03 DIAGNOSIS — R7989 Other specified abnormal findings of blood chemistry: Secondary | ICD-10-CM

## 2019-09-03 HISTORY — PX: RIGHT/LEFT HEART CATH AND CORONARY/GRAFT ANGIOGRAPHY: CATH118267

## 2019-09-03 LAB — GLUCOSE, CAPILLARY
Glucose-Capillary: 122 mg/dL — ABNORMAL HIGH (ref 70–99)
Glucose-Capillary: 86 mg/dL (ref 70–99)
Glucose-Capillary: 82 mg/dL (ref 70–99)
Glucose-Capillary: 156 mg/dL — ABNORMAL HIGH (ref 70–99)
Glucose-Capillary: 86 mg/dL (ref 70–99)

## 2019-09-03 LAB — POCT I-STAT 7, (LYTES, BLD GAS, ICA,H+H)
Acid-Base Excess: 3 mmol/L — ABNORMAL HIGH (ref 0.0–2.0)
Bicarbonate: 29.3 mmol/L — ABNORMAL HIGH (ref 20.0–28.0)
Calcium, Ion: 1.35 mmol/L (ref 1.15–1.40)
HCT: 26 % — ABNORMAL LOW (ref 39.0–52.0)
Hemoglobin: 8.8 g/dL — ABNORMAL LOW (ref 13.0–17.0)
O2 Saturation: 100 %
Potassium: 4.1 mmol/L (ref 3.5–5.1)
Sodium: 139 mmol/L (ref 135–145)
TCO2: 31 mmol/L (ref 22–32)
pCO2 arterial: 51.9 mmHg — ABNORMAL HIGH (ref 32.0–48.0)
pH, Arterial: 7.359 (ref 7.350–7.450)
pO2, Arterial: 196 mmHg — ABNORMAL HIGH (ref 83.0–108.0)

## 2019-09-03 LAB — BASIC METABOLIC PANEL
Anion gap: 5 (ref 5–15)
BUN: 33 mg/dL — ABNORMAL HIGH (ref 8–23)
CO2: 31 mmol/L (ref 22–32)
Calcium: 9 mg/dL (ref 8.9–10.3)
Chloride: 100 mmol/L (ref 98–111)
Creatinine, Ser: 1.76 mg/dL — ABNORMAL HIGH (ref 0.61–1.24)
GFR calc Af Amer: 45 mL/min — ABNORMAL LOW (ref >60)
GFR calc non Af Amer: 39 mL/min — ABNORMAL LOW (ref >60)
Glucose, Bld: 180 mg/dL — ABNORMAL HIGH (ref 70–99)
Potassium: 4.7 mmol/L (ref 3.5–5.1)
Sodium: 136 mmol/L (ref 135–145)

## 2019-09-03 LAB — POCT I-STAT EG7
Acid-Base Excess: 2 mmol/L (ref 0.0–2.0)
Acid-Base Excess: 3 mmol/L — ABNORMAL HIGH (ref 0.0–2.0)
Bicarbonate: 29.3 mmol/L — ABNORMAL HIGH (ref 20.0–28.0)
Bicarbonate: 29.6 mmol/L — ABNORMAL HIGH (ref 20.0–28.0)
Calcium, Ion: 1.37 mmol/L (ref 1.15–1.40)
Calcium, Ion: 1.37 mmol/L (ref 1.15–1.40)
HCT: 24 % — ABNORMAL LOW (ref 39.0–52.0)
HCT: 24 % — ABNORMAL LOW (ref 39.0–52.0)
Hemoglobin: 8.2 g/dL — ABNORMAL LOW (ref 13.0–17.0)
Hemoglobin: 8.2 g/dL — ABNORMAL LOW (ref 13.0–17.0)
O2 Saturation: 69 %
O2 Saturation: 70 %
Potassium: 4 mmol/L (ref 3.5–5.1)
Potassium: 4 mmol/L (ref 3.5–5.1)
Sodium: 139 mmol/L (ref 135–145)
Sodium: 139 mmol/L (ref 135–145)
TCO2: 31 mmol/L (ref 22–32)
TCO2: 31 mmol/L (ref 22–32)
pCO2, Ven: 59.2 mmHg (ref 44.0–60.0)
pCO2, Ven: 59.3 mmHg (ref 44.0–60.0)
pH, Ven: 7.301 (ref 7.250–7.430)
pH, Ven: 7.307 (ref 7.250–7.430)
pO2, Ven: 41 mmHg (ref 32.0–45.0)
pO2, Ven: 41 mmHg (ref 32.0–45.0)

## 2019-09-03 LAB — HEPARIN LEVEL (UNFRACTIONATED): Heparin Unfractionated: 0.7 IU/mL (ref 0.30–0.70)

## 2019-09-03 LAB — CBC
HCT: 29 % — ABNORMAL LOW (ref 39.0–52.0)
Hemoglobin: 9.2 g/dL — ABNORMAL LOW (ref 13.0–17.0)
MCH: 30.5 pg (ref 26.0–34.0)
MCHC: 31.7 g/dL (ref 30.0–36.0)
MCV: 96 fL (ref 80.0–100.0)
Platelets: 122 10*3/uL — ABNORMAL LOW (ref 150–400)
RBC: 3.02 MIL/uL — ABNORMAL LOW (ref 4.22–5.81)
RDW: 13.7 % (ref 11.5–15.5)
WBC: 7.2 10*3/uL (ref 4.0–10.5)
nRBC: 0 % (ref 0.0–0.2)

## 2019-09-03 SURGERY — RIGHT/LEFT HEART CATH AND CORONARY/GRAFT ANGIOGRAPHY
Anesthesia: LOCAL

## 2019-09-03 MED ORDER — SODIUM CHLORIDE 0.9% FLUSH
3.0000 mL | Freq: Two times a day (BID) | INTRAVENOUS | Status: DC
  Administered 2019-09-03 – 2019-09-04 (×3): 3 mL via INTRAVENOUS

## 2019-09-03 MED ORDER — HEPARIN (PORCINE) IN NACL 1000-0.9 UT/500ML-% IV SOLN
INTRAVENOUS | Status: DC | PRN
  Administered 2019-09-03 (×2): 500 mL

## 2019-09-03 MED ORDER — HEPARIN SODIUM (PORCINE) 5000 UNIT/ML IJ SOLN
5000.0000 [IU] | Freq: Three times a day (TID) | INTRAMUSCULAR | Status: DC
  Administered 2019-09-03 – 2019-09-04 (×3): 5000 [IU] via SUBCUTANEOUS
  Filled 2019-09-03 (×3): qty 1

## 2019-09-03 MED ORDER — MIDAZOLAM HCL 2 MG/2ML IJ SOLN
INTRAMUSCULAR | Status: AC
  Filled 2019-09-03: qty 2

## 2019-09-03 MED ORDER — SODIUM CHLORIDE 0.9 % IV SOLN: 250.0000 mL | INTRAVENOUS | Status: DC | PRN

## 2019-09-03 MED ORDER — HEPARIN (PORCINE) IN NACL 1000-0.9 UT/500ML-% IV SOLN
INTRAVENOUS | Status: AC
  Filled 2019-09-03: qty 1000

## 2019-09-03 MED ORDER — FENTANYL CITRATE (PF) 100 MCG/2ML IJ SOLN
INTRAMUSCULAR | Status: AC
  Filled 2019-09-03: qty 2

## 2019-09-03 MED ORDER — LIDOCAINE HCL (PF) 1 % IJ SOLN
INTRAMUSCULAR | Status: AC
  Filled 2019-09-03: qty 30

## 2019-09-03 MED ORDER — HYDRALAZINE HCL 20 MG/ML IJ SOLN: 10.0000 mg | INTRAMUSCULAR | Status: AC | PRN

## 2019-09-03 MED ORDER — FENTANYL CITRATE (PF) 100 MCG/2ML IJ SOLN
INTRAMUSCULAR | Status: DC | PRN
  Administered 2019-09-03: 25 ug via INTRAVENOUS

## 2019-09-03 MED ORDER — LABETALOL HCL 5 MG/ML IV SOLN: 10.0000 mg | INTRAVENOUS | Status: AC | PRN

## 2019-09-03 MED ORDER — SODIUM CHLORIDE 0.9% FLUSH: 3.0000 mL | INTRAVENOUS | Status: DC | PRN

## 2019-09-03 MED ORDER — LIDOCAINE HCL (PF) 1 % IJ SOLN
INTRAMUSCULAR | Status: DC | PRN
  Administered 2019-09-03: 30 mL

## 2019-09-03 MED ORDER — SODIUM CHLORIDE 0.9 % IV SOLN
INTRAVENOUS | Status: AC
  Administered 2019-09-03: 13:00:00 via INTRAVENOUS

## 2019-09-03 MED ORDER — MIDAZOLAM HCL 2 MG/2ML IJ SOLN
INTRAMUSCULAR | Status: DC | PRN
  Administered 2019-09-03: 1 mg via INTRAVENOUS

## 2019-09-03 MED ORDER — IOHEXOL 350 MG/ML SOLN
INTRAVENOUS | Status: DC | PRN
  Administered 2019-09-03: 70 mL

## 2019-09-03 MED FILL — Fentanyl Citrate Preservative Free (PF) Inj 100 MCG/2ML: INTRAMUSCULAR | Qty: 2 | Status: AC

## 2019-09-03 SURGICAL SUPPLY — 19 items
CATH EXPO 5F MPA-1 (CATHETERS) ×1 IMPLANT
CATH INFINITI 5 FR AR2 MOD (CATHETERS) ×1 IMPLANT
CATH INFINITI 5 FR IM (CATHETERS) ×1 IMPLANT
CATH INFINITI 5FR MULTPACK ANG (CATHETERS) ×1 IMPLANT
CATH SWAN GANZ 7F STRAIGHT (CATHETERS) ×1 IMPLANT
KIT HEART LEFT (KITS) ×2 IMPLANT
PACK CARDIAC CATHETERIZATION (CUSTOM PROCEDURE TRAY) ×2 IMPLANT
SHEATH PINNACLE 5F 10CM (SHEATH) ×1 IMPLANT
SHEATH PINNACLE 7F 10CM (SHEATH) ×1 IMPLANT
SHEATH PROBE COVER 6X72 (BAG) ×1 IMPLANT
TRANSDUCER W/STOPCOCK (MISCELLANEOUS) ×3 IMPLANT
TUBING ART PRESS 72  MALE/FEM (TUBING) ×1
TUBING ART PRESS 72 MALE/FEM (TUBING) IMPLANT
TUBING CIL FLEX 10 FLL-RA (TUBING) ×1 IMPLANT
WIRE EMERALD 3MM-J .025X260CM (WIRE) ×1 IMPLANT
WIRE EMERALD 3MM-J .035X150CM (WIRE) ×1 IMPLANT
WIRE EMERALD 3MM-J .035X260CM (WIRE) ×1 IMPLANT
WIRE EMERALD ST .035X150CM (WIRE) ×1 IMPLANT
WIRE HI TORQ VERSACORE-J 145CM (WIRE) ×1 IMPLANT

## 2019-09-03 NOTE — Plan of Care (Signed)
  Problem: Clinical Measurements: Goal: Ability to maintain clinical measurements within normal limits will improve Outcome: Progressing   

## 2019-09-03 NOTE — Interval H&P Note (Signed)
History and Physical Interval Note:  09/03/2019 8:42 AM  [name]  has presented today for surgery, with the diagnosis of angina & Severe Aortic Stenosis.  The various methods of treatment have been discussed with the patient and family. After consideration of risks, benefits and other options for treatment, the patient has consented to  Procedure(s): RIGHT/LEFT HEART CATH AND CORONARY/GRAFT ANGIOGRAPHY (N/A)  PERCUTANEOUS CORONARY INTERVENTION  as a surgical intervention.  The patient's history has been reviewed, patient examined, no change in status, stable for surgery.  I have reviewed the patient's chart and labs.  Questions were answered to the patient's satisfaction.     Glenetta Hew

## 2019-09-03 NOTE — Progress Notes (Signed)
Glucose 86 @ 1040 am

## 2019-09-03 NOTE — Progress Notes (Signed)
Electrophysiology Rounding Note  Patient Name: [name] Date of Encounter: 09/03/2019  Primary Cardiologist: Dr. Angelena Form Electrophysiologist: Dr. Lovena Le   Subjective   Called to see patient s/p L/RHC with new bleeding to his PPM site.  Large outer bandage remove and bright red blood noted on steri-strips with small, soft hematoma. Manual pressure held for >15 minutes and pressure dressing applied with resolution.   Inpatient Medications    Scheduled Meds: . amitriptyline  75 mg Oral QHS  . aspirin EC  81 mg Oral Daily  . atorvastatin  80 mg Oral q1800  . Chlorhexidine Gluconate Cloth  6 each Topical Daily  . docusate sodium  100 mg Oral TID  . DULoxetine  30 mg Oral Daily  . finasteride  5 mg Oral Daily  . heparin  5,000 Units Subcutaneous Q8H  . hydroxychloroquine  200 mg Oral Daily  . insulin aspart  0-15 Units Subcutaneous TID WC  . insulin aspart  0-5 Units Subcutaneous QHS  . methadone  10 mg Oral Q6H  . polyethylene glycol  17 g Oral BID  . predniSONE  5 mg Oral Q breakfast  . pregabalin  25 mg Oral QHS  . sodium chloride flush  3 mL Intravenous Q12H  . sodium chloride flush  3 mL Intravenous Q12H  . tamsulosin  0.4 mg Oral QHS  . torsemide  20 mg Oral QODAY   Continuous Infusions: . sodium chloride 10 mL/hr at 08/30/19 0800  . sodium chloride    . sodium chloride     PRN Meds: sodium chloride, sodium chloride, acetaminophen, clonazePAM, hydrALAZINE, labetalol, methadone, nitroGLYCERIN, ondansetron (ZOFRAN) IV, sodium chloride flush   Vital Signs    Vitals:   09/03/19 1145 09/03/19 1200 09/03/19 1215 09/03/19 1230  BP: (!) 131/55 (!) 115/45 (!) 109/47 (!) 120/50  Pulse: 69 65 66 70  Resp:      Temp:      TempSrc:      SpO2:      Weight:      Height:        Intake/Output Summary (Last 24 hours) at 09/03/2019 1303 Last data filed at 09/03/2019 0659 Gross per 24 hour  Intake 1504.69 ml  Output 1150 ml  Net 354.69 ml   Filed Weights   09/01/19  0617 09/02/19 0404 09/03/19 0635  Weight: 74.4 kg 76.5 kg 72.1 kg    Physical Exam    GEN- The patient is elderly appearing, alert and oriented x 3 today.   Head- normocephalic, atraumatic Eyes-  Sclera clear, conjunctiva pink Ears- hearing intact Oropharynx- clear Neck- supple Lungs- Clear to ausculation bilaterally, normal work of breathing Heart- Regular rate and rhythm, no murmurs, rubs or gallops GI- soft, NT, ND, + BS Extremities- no clubbing, cyanosis, or edema Skin- PPM site with bright red bleeding on dressing and small, soft hematoma.  Pressure dressing applied this encounter.  Psych- euthymic mood, full affect Neuro- strength and sensation are intact  Labs    CBC Recent Labs    09/02/19 0310 09/03/19 0021  WBC 6.2 7.2  HGB 9.7* 9.2*  HCT 30.7* 29.0*  MCV 95.6 96.0  PLT 110* 629*   Basic Metabolic Panel Recent Labs    09/02/19 0310 09/03/19 0021  NA 136 136  K 4.3 4.7  CL 101 100  CO2 26 31  GLUCOSE 221* 180*  BUN 31* 33*  CREATININE 1.55* 1.76*  CALCIUM 8.9 9.0   Liver Function Tests No results for input(s): AST,  ALT, ALKPHOS, BILITOT, PROT, ALBUMIN in the last 72 hours. No results for input(s): LIPASE, AMYLASE in the last 72 hours. Cardiac Enzymes No results for input(s): CKTOTAL, CKMB, CKMBINDEX, TROPONINI in the last 72 hours. BNP Invalid input(s): POCBNP D-Dimer No results for input(s): DDIMER in the last 72 hours. Hemoglobin A1C No results for input(s): HGBA1C in the last 72 hours. Fasting Lipid Panel No results for input(s): CHOL, HDL, LDLCALC, TRIG, CHOLHDL, LDLDIRECT in the last 72 hours. Thyroid Function Tests No results for input(s): TSH, T4TOTAL, T3FREE, THYROIDAB in the last 72 hours.  Invalid input(s): FREET3  Telemetry    NSR 70s with intermittent A pacing (personally reviewed)  Radiology    No results found.   Patient Profile     [name] is a 68 y.o. male with a history of CAD s/p CABG 2016, DM, HTN, post op  afib, CKD III-IV, HLD, PAD s/p left below knee amputation, RA, and chronic neck pain with neuropathy who is being seen s/p PPM for CHB 08/30/2019   Assessment & Plan    1. CHB s/p PPM implantation 08/30/2019 Device function normal post op check. CXR stable 08/31/2019 Now with hematoma in setting of heparin leading up to cath today. Manual pressure held for > 15 minutes and pressure dressing applied.  Will follow and re-assess hematoma in am.   2. CAD s/p CABG x 3 Patent grafts on cath today. Plan to continue work up for TAVR consideration  For questions or updates, please contact [facility] Please consult [email or web address] for contact info under Cardiology/STEMI.  Signed, Shirley Friar, PA-C  09/03/2019, 1:03 PM

## 2019-09-03 NOTE — Progress Notes (Signed)
Order for sheath removal verified per post procedural orders. Procedure explained to patient and right femoral artery and right femoral vein site assessed: level 0, palpable dorsalis pedis and posterior tibial pulses. 5 [place] Sheath and 7 french sheath removed and manual pressure applied for 20 minutes. Pre, peri, & post procedural vitals: HR 64, RR 12, O2 Sat 100%, BP 124/44, Pain level 0. Distal pulses remained intact after sheath removal. Access site level 0 and dressed with 4X4 gauze and [email or web address] confirmed condition of site. Post procedural instructions discussed with return demonstration from patient.

## 2019-09-03 NOTE — Progress Notes (Signed)
ANTICOAGULATION CONSULT NOTE - Follow Up Consult  Pharmacy Consult for heparin  Indication: chest pain/ACS  Allergies  Allergen Reactions  . Sulfa Antibiotics Rash    Patient Measurements: Height: 5\' 7"  (170.2 cm) Weight: 158 lb 15.2 oz (72.1 kg) IBW/kg (Calculated) : 66.1 Heparin Dosing Weight: 74.3 kg  Vital Signs: Temp: 97.4 F (36.3 C) (09/08 0635) Temp Source: Oral (09/08 0635) BP: 113/57 (09/08 0635) Pulse Rate: 74 (09/08 0635)  Labs: Recent Labs    09/01/19 0722  09/02/19 0310 09/02/19 0951 09/03/19 0021 09/03/19 0528  HGB 9.2*  --  9.7*  --  9.2*  --   HCT 28.7*  --  30.7*  --  29.0*  --   PLT 98*  --  110*  --  122*  --   HEPARINUNFRC  --    < > 0.46 0.65  --  0.70  CREATININE 1.44*  --  1.55*  --  1.76*  --    < > = values in this interval not displayed.    Estimated Creatinine Clearance: 37 mL/min (A) (by C-G formula based on SCr of 1.76 mg/dL (H)).   Medical History: Past Medical History:  Diagnosis Date  . Aortic stenosis   . Chronic back pain   . Chronic combined systolic (congestive) and diastolic (congestive) heart failure (HCC)    a. EF 30-35% in 2016 b. at 30-35% by echo in 08/2016 c. 06/2019: echo showing EF of 60-65% but found to have severe AS.   [name] Chronic neck pain   . Coronary artery disease    a. s/p CABG in 06/2015 with LIMA-LAD, SVG-PDA, SVG-Intermediate  . Diabetes mellitus   . Diabetic neuropathy ([place])   . Hyperkalemia   . Hypertension   . Loculated pleural effusion 09/09/2016  . Pancytopenia ([place]) 08/13/2014  . 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  . Rheumatoid arthritis(714.0)   . Seizure ([place]) 08/13/2014   Pt denies   Assessment: 68 year old male s/p temp pacer wire in rt groin placed on 9/3. Heparin ordered for r/o acs prior to cath and continued post pacer placement. Pt now s/p PPM on 9/4 and heparin resumed 9/6 afternoon. Planning for cath on Tuesday 9/8.   Heparin level  therapeutic but on higher end, cath later today, CBC stable.   Goal of Therapy:  Heparin level 0.3-0.7 units/ml Monitor platelets by anticoagulation protocol: Yes   Plan:  Reduce heparin to 1150 units/hr for now Follow-up plans for anticoagulation post/cath   Arrie Senate, PharmD, BCPS Clinical Pharmacist [phone] Please check AMION for all [place] numbers 09/03/2019

## 2019-09-03 NOTE — Progress Notes (Addendum)
Pt received from cath lab. Wife at bedside R groin dressing Level 0. Pt has pacemaker placed 2 days ago, dressing in place, RN from cath lab stated that the pacemaker site is bleeding, marked. Will monitor. EP PA notified.

## 2019-09-03 NOTE — Progress Notes (Signed)
Progress Note  Patient Name: [name] Date of Encounter: 09/03/2019  Primary Cardiologist: Carlyle Dolly, MD   Subjective   No chest pain  Inpatient Medications    Scheduled Meds: . [MAR Hold] amitriptyline  75 mg Oral QHS  . [MAR Hold] aspirin EC  81 mg Oral Daily  . [MAR Hold] atorvastatin  80 mg Oral q1800  . [MAR Hold] Chlorhexidine Gluconate Cloth  6 each Topical Daily  . [MAR Hold] docusate sodium  100 mg Oral TID  . [MAR Hold] DULoxetine  30 mg Oral Daily  . [MAR Hold] finasteride  5 mg Oral Daily  . [MAR Hold] hydroxychloroquine  200 mg Oral Daily  . [MAR Hold] insulin aspart  0-15 Units Subcutaneous TID WC  . [MAR Hold] insulin aspart  0-5 Units Subcutaneous QHS  . [MAR Hold] methadone  10 mg Oral Q6H  . [MAR Hold] polyethylene glycol  17 g Oral BID  . [MAR Hold] predniSONE  5 mg Oral Q breakfast  . [MAR Hold] pregabalin  25 mg Oral QHS  . [MAR Hold] sodium chloride flush  3 mL Intravenous Q12H  . sodium chloride flush  3 mL Intravenous Q12H  . [MAR Hold] tamsulosin  0.4 mg Oral QHS  . [MAR Hold] torsemide  20 mg Oral QODAY   Continuous Infusions: . [MAR Hold] sodium chloride 10 mL/hr at 08/30/19 0800  . sodium chloride    . sodium chloride 50 mL/hr at 09/03/19 0659  . heparin 1,200 Units/hr (09/03/19 0659)   PRN Meds: [IWP Hold] sodium chloride, sodium chloride, [MAR Hold] acetaminophen, [MAR Hold] clonazePAM, [MAR Hold] methadone, [MAR Hold] nitroGLYCERIN, [MAR Hold] ondansetron (ZOFRAN) IV, sodium chloride flush   Vital Signs    Vitals:   09/03/19 1003 09/03/19 1008 09/03/19 1013 09/03/19 1051  BP: (!) 149/65 120/61  (!) 127/42  Pulse: (!) 295 (!) 164 (!) 0 65  Resp: 12 (!) 9 (!) 0 12  Temp:      TempSrc:      SpO2: 100% (!) 0% (!) 0% 100%  Weight:      Height:        Intake/Output Summary (Last 24 hours) at 09/03/2019 1101 Last data filed at 09/03/2019 0659 Gross per 24 hour  Intake 1504.69 ml  Output 1150 ml  Net 354.69 ml   Last 3  Weights 09/03/2019 09/02/2019 09/01/2019  Weight (lbs) 158 lb 15.2 oz 168 lb 10.4 oz 164 lb 0.4 oz  Weight (kg) 72.1 kg 76.5 kg 74.4 kg      Telemetry    sinus - Personally Reviewed  ECG    No AM EKG - Personally Reviewed  Physical Exam    General: Well developed, well nourished, NAD  HEENT: OP clear, mucus membranes moist  SKIN: warm, dry. No rashes. Neuro: No focal deficits  Musculoskeletal: Muscle strength 5/5 all ext  Psychiatric: Mood and affect normal  Neck: No JVD, no carotid bruits, no thyromegaly, no lymphadenopathy.  Lungs:Clear bilaterally, no wheezes, rhonci, crackles Cardiovascular: Regular rate and rhythm. No murmurs, gallops or rubs. Abdomen:Soft. Bowel sounds present. Non-tender.  Extremities: No lower extremity edema. Pulses are 2 + in the bilateral DP/PT.    Labs    High Sensitivity Troponin:   Recent Labs  Lab 08/29/19 1545 08/29/19 1727 08/29/19 1751 08/29/19 2004  TROPONINIHS 505* 544* 528* 427*      Chemistry Recent Labs  Lab 09/01/19 0722 09/02/19 0310 09/03/19 0021  NA 137 136 136  K 4.2 4.3  4.7  CL 104 101 100  CO2 24 26 31   GLUCOSE 173* 221* 180*  BUN 31* 31* 33*  CREATININE 1.44* 1.55* 1.76*  CALCIUM 8.6* 8.9 9.0  GFRNONAA 49* 45* 39*  GFRAA 57* 52* 45*  ANIONGAP 9 9 5      Hematology Recent Labs  Lab 09/01/19 0722 09/02/19 0310 09/03/19 0021  WBC 5.4 6.2 7.2  RBC 3.01* 3.21* 3.02*  HGB 9.2* 9.7* 9.2*  HCT 28.7* 30.7* 29.0*  MCV 95.3 95.6 96.0  MCH 30.6 30.2 30.5  MCHC 32.1 31.6 31.7  RDW 13.8 13.5 13.7  PLT 98* 110* 122*    BNPNo results for input(s): BNP, PROBNP in the last 168 hours.   DDimer No results for input(s): DDIMER in the last 168 hours.   Radiology    No results found.  Cardiac Studies   Cardiac cath 09/03/19:  Ost RCA to Dist RCA lesion is 100% stenosed.  Dist LM to Mid LAD lesion is 100% stenosed.  Ramus lesion is 100% stenosed.  1st Mrg lesion is 65% stenosed.  GRAFTS  LIMA-LAD graft  was visualized by angiography and is normal in caliber. The graft exhibits no disease.  SVG-RI graft was visualized by angiography and is normal in caliber. The graft exhibits no disease.  SVG-rPDA graft was visualized by angiography and is large. Origin lesion is 50% stenosed.  RPDA lesion is 75% stenosed.  ---------------------------------------  Hemodynamic findings consistent with aortic valve stenosis. There is severe aortic valve stenosis by echo, but by cath moderate to severe  LV end diastolic pressure is normal.  The left ventricular systolic function is normal with normal cardiac output index.   SUMMARY  Severe 4-vessel native CAD with 100% flush CTO of the LAD, RI and RCA with 60% stenosis of ostial OM1 and ostial PDA (fill via SVG-PDA)  Widely patent LIMA-LAD, SVG-RI with moderate ostial and proximal disease in SVG-RPDA (retrograde flow from RPDA 2 RPL system somewhat jeopardized by ostial 75% PDA lesion-not PCI target)  Echocardiographically Severe Aortic Stenosis -> with direct measurements of P-P pressure 38 million mercury and mean 31 mmHg.  Normal CARDIAC OUTPUT & CARDIAC INDEX  Relatively normal RIGHT HEART CATH pressures  Patient Profile     68 y.o. male with h/o CAD s/p CABG, PAD s/p BKA, RA, HTN, HLD, severe aortic stenosis and complete heart block who was admitted last week with chest pain and complete heart block. He is now s/p pacemaker.   Assessment & Plan    1. CAD s/p CABG: Recent chest pains concerning for unstable angina. Cardiac cath this morning with patent bypass grafts. His recent chest pain was probably due to demand ischemia from his bradycardia/heart block. Will continue ASA and statin. BMET in am.    2. Severe aortic stenosis: Mean gradient 30.9 mmHg by cath today. His aortic valve stenosis is likely severe. Will continue workup for TAVR as an outpatient. Anticipate that we will stage his CT scans in 1-2 weeks while making sure his renal  function is stable.    3. Complete heart block: s/p pacemaker.   Likely d/c home tomorrow if renal function is stable.   For questions or updates, please contact [place] Please consult [email or web address] for contact info under        Signed, Lauree Chandler, MD  09/03/2019, 11:01 AM

## 2019-09-03 NOTE — Progress Notes (Addendum)
Re-assessed hematoma site. No further bleeding onto dressing. Non-tender and soft. Will leave in place overnight and re-assess in am.   Shirley Friar, [place]  Pager: [phone]  09/03/2019

## 2019-09-04 ENCOUNTER — Telehealth: Payer: Self-pay | Admitting: Cardiovascular Disease

## 2019-09-04 ENCOUNTER — Other Ambulatory Visit: Payer: Self-pay | Admitting: Physician Assistant

## 2019-09-04 DIAGNOSIS — R058 Other specified cough: Secondary | ICD-10-CM

## 2019-09-04 DIAGNOSIS — I5032 Chronic diastolic (congestive) heart failure: Secondary | ICD-10-CM

## 2019-09-04 DIAGNOSIS — Z23 Encounter for immunization: Secondary | ICD-10-CM | POA: Diagnosis not present

## 2019-09-04 DIAGNOSIS — L8990 Pressure ulcer of unspecified site, unspecified stage: Secondary | ICD-10-CM | POA: Insufficient documentation

## 2019-09-04 DIAGNOSIS — R05 Cough: Secondary | ICD-10-CM

## 2019-09-04 LAB — BASIC METABOLIC PANEL
Anion gap: 8 (ref 5–15)
BUN: 31 mg/dL — ABNORMAL HIGH (ref 8–23)
CO2: 29 mmol/L (ref 22–32)
Calcium: 8.9 mg/dL (ref 8.9–10.3)
Chloride: 102 mmol/L (ref 98–111)
Creatinine, Ser: 1.67 mg/dL — ABNORMAL HIGH (ref 0.61–1.24)
GFR calc Af Amer: 48 mL/min — ABNORMAL LOW (ref >60)
GFR calc non Af Amer: 41 mL/min — ABNORMAL LOW (ref >60)
Glucose, Bld: 168 mg/dL — ABNORMAL HIGH (ref 70–99)
Potassium: 4.9 mmol/L (ref 3.5–5.1)
Sodium: 139 mmol/L (ref 135–145)

## 2019-09-04 LAB — CBC
HCT: 28.5 % — ABNORMAL LOW (ref 39.0–52.0)
Hemoglobin: 8.9 g/dL — ABNORMAL LOW (ref 13.0–17.0)
MCH: 30.7 pg (ref 26.0–34.0)
MCHC: 31.2 g/dL (ref 30.0–36.0)
MCV: 98.3 fL (ref 80.0–100.0)
Platelets: 121 10*3/uL — ABNORMAL LOW (ref 150–400)
RBC: 2.9 MIL/uL — ABNORMAL LOW (ref 4.22–5.81)
RDW: 14 % (ref 11.5–15.5)
WBC: 7.1 10*3/uL (ref 4.0–10.5)
nRBC: 0 % (ref 0.0–0.2)

## 2019-09-04 LAB — GLUCOSE, CAPILLARY
Glucose-Capillary: 132 mg/dL — ABNORMAL HIGH (ref 70–99)
Glucose-Capillary: 153 mg/dL — ABNORMAL HIGH (ref 70–99)

## 2019-09-04 MED ORDER — MUPIROCIN 2 % EX OINT
TOPICAL_OINTMENT | Freq: Every day | CUTANEOUS | Status: DC
  Administered 2019-09-04: 14:00:00 via TOPICAL
  Filled 2019-09-04: qty 22

## 2019-09-04 MED ORDER — PNEUMOCOCCAL VAC POLYVALENT 25 MCG/0.5ML IJ INJ
0.5000 mL | INJECTION | Freq: Once | INTRAMUSCULAR | Status: AC
  Administered 2019-09-04: 0.5 mL via INTRAMUSCULAR
  Filled 2019-09-04: qty 0.5

## 2019-09-04 MED ORDER — INFLUENZA VAC A&B SA ADJ QUAD 0.5 ML IM PRSY
0.5000 mL | PREFILLED_SYRINGE | Freq: Once | INTRAMUSCULAR | Status: AC
  Administered 2019-09-04: 0.5 mL via INTRAMUSCULAR
  Filled 2019-09-04: qty 0.5

## 2019-09-04 MED ORDER — NITROGLYCERIN 0.4 MG SL SUBL: 0.4000 mg | SUBLINGUAL_TABLET | SUBLINGUAL | 3 refills | Status: AC | PRN

## 2019-09-04 MED FILL — NITROGLYCERIN 0.4 MG TAB SL: 0.4 | 8 days supply | Qty: 25 | Fill #0

## 2019-09-04 NOTE — Plan of Care (Signed)
  Problem: Education: Goal: Knowledge of General Education information will improve Description: Including pain rating scale, medication(s)/side effects and non-pharmacologic comfort measures Outcome: Adequate for Discharge   

## 2019-09-04 NOTE — Discharge Summary (Signed)
Discharge Summary    Patient ID: [name] MRN: [id number]; DOB: 1951/02/25  Admit date: 08/29/2019 Discharge date: 09/04/2019  Primary Care Provider: [name], MD  Primary Cardiologist: Carlyle Dolly, MD , Dr. Angelena Form (TAVR) Primary Electrophysiologist:  None   Discharge Diagnoses    Principal Problem:   Heart block AV third degree [facility]) Active Problems:   Hypertension   Insulin dependent diabetes mellitus ([place])   Hyperlipidemia   Acute renal failure superimposed on stage 3 chronic kidney disease ([place])   Current chronic use of systemic steroids   Unstable angina (HCC)   Pacemaker   Severe aortic stenosis by prior echocardiogram   Chronic diastolic heart failure (HCC)   Pressure injury of skin   Allergies Allergies  Allergen Reactions  . Sulfa Antibiotics Rash    Diagnostic Studies/Procedures    Right and left heart cath 09/03/19:  Ost RCA to Dist RCA lesion is 100% stenosed.  Dist LM to Mid LAD lesion is 100% stenosed.  Ramus lesion is 100% stenosed.  1st Mrg lesion is 65% stenosed.  GRAFTS  LIMA-LAD graft was visualized by angiography and is normal in caliber. The graft exhibits no disease.  SVG-RI graft was visualized by angiography and is normal in caliber. The graft exhibits no disease.  SVG-rPDA graft was visualized by angiography and is large. Origin lesion is 50% stenosed.  RPDA lesion is 75% stenosed.  ---------------------------------------  Hemodynamic findings consistent with aortic valve stenosis. There is severe aortic valve stenosis by echo, but by cath moderate to severe  LV end diastolic pressure is normal.  The left ventricular systolic function is normal with normal cardiac output index.   SUMMARY  Severe 4-vessel native CAD with 100% flush CTO of the LAD, RI and RCA with 60% stenosis of ostial OM1 and ostial PDA (fill via SVG-PDA)  Widely patent LIMA-LAD, SVG-RI with moderate ostial and proximal disease in SVG-RPDA  (retrograde flow from RPDA 2 RPL system somewhat jeopardized by ostial 75% PDA lesion-not PCI target)  Echocardiographically Severe Aortic Stenosis -> with direct measurements of P-P pressure 38 million mercury and mean 31 mmHg.  Normal CARDIAC OUTPUT & CARDIAC INDEX  Relatively normal RIGHT HEART CATH pressures   RECOMMENDATIONS  Patient will return to his nursing unit for ongoing care after sheath removal in PACU holding area  Proceed with structural heart disease team evaluation for TAVR  Continue risk factor modification   History of Present Illness     [name] is a 68 yo male with history of chronic combined systolic and diastolic CHF, ischemic cardiomyopathy, CAD s/p 3V CABG in 2016, DM, HTN, post-operative atrial fibrillation, stage 3 chronic kidney disease, hyperlipidemia, PAD s/p left below knee amputation, rheumatoid arthritis and chronic neck pain and neuropathy with recent finding of severe aortic stenosis. He presented to structural heart clinic as a new consult for possible TAVR on 08/29/19.  He is followed in our office by Dr. [name]. He was admitted to [facility] July 01, 2019 with chest pain and found to have hypotensive and bradycardic. Troponin was mildly elevated. He was fluid resuscitated. Echo 07/02/19 with LVEF=60-65%. The aortic valve leaflets are thickened and calcified with limited leaflets mobility. The mean gradient is 16.8 mmHg, peak gradient 27.5 mmHg, AVA 0.79 cm2, dimensionless index 0.29. He had acute renal failure during this admission felt to be due to dehydration. Nuclear stress test July 2020with an inferior wall perfusion defect not felt to be due to ischemia.   He was seen for  follow up by virtual visit 07/31/19 by Bernerd Pho, PA-C in our [facility] office in [place], [place]. He reported no dyspnea or chest pain. He is wheelchair bound. He denied weight gain or lower extremity edema.   During his clinic visit with Dr. Angelena Form, he reports that hehas  been having severe chest pain over the past 48 hours. This is described as a burning sensation in the center of his chest pain. Worsened dyspnea. No edema in right lower extremity. He has been feeling weak. He lives with his wife in [place], [place]. He has full dentures. He is retired from [employer]. He is having no active chest pain at this time.Given his symptoms, admission to [facility] cardiology was recommended. No beds, went through the ER.   On arrival, found to be in CHB.  Hospital Course     Consultants: EP  Intermittent complete heart block He was admitted to cardiology service and found to be in complete heart block with junctional escape at 36 bpm. He underwent placement of a temporary transvenous pacemaker for symptomatic bradycardia (08/29/19). Overnight, he intermittently paced but noted to be sinus rhythm under pacer. EP was consulted and eventually underwent placement of permanent pacemaker on 08/30/19 - Medtronic dual-chamber pacemaker.    CAD s/p CABG [facility] in 2016) In the setting of unstable angina and new heart block, he was taken to the cath lab for right and left heart cath after improvement in his sCr. R/L heart cath on 09/03/19 revealed patent LIMA-LAD, SVG-RI with moderate ostial and proximal disease in the SVG-RPDA not a good PCI target. No intervention. Normal right heart cath pressures and normal cardiac output. Recommendations to proceed with TAVR workup. Recent chest pain thought to be due to demand ischemia in the setting of bradycardia and heart block. Continue ASA and statin.     CKD stage III Admission sCr was 2.77 and peaked at 2.91 before trending down. Now 1.67. Baseline sCr appears to be 1.2-1.4. K is 4.9.   Severe aortic valve stenosis Mean gradient on heart cath 30.9 mmHg suggesting severe AS. Continue workup for TAVR as an outpatient. CT scan planned for 1-2 weeks when renal function is stable.    Post-op Afib Complication of CABG in 2130.  This  patients CHA2DS2-VASc Score and unadjusted Ischemic Stroke Rate (% per year) is equal to 4.8 % stroke rate/year from a score of 4 (HTN, DM, MI, age). Will monitor for recurrence on device. No anticoagulation for now.    Right lower leg wound Prior to hospitalization. Given his history of BKA and DM, pt and wife requesting wound care consult prior to discharge. Has completed a course of keflex. Consult placed to [place].    Productive cough Pt reports years of productive cough with "pasty" white phlegm approximately 6 times daily. Will place an ambulatory referral to pulmonology.   Chronic diastolic heart failure Preserved EF of 60-65%. Euvolemic on exam. Continue OP diuretic.   Pt seen and examined by Dr. Burt Knack and felt stable for discharge. Follow up will be made.    _____________  Discharge Vitals Blood pressure (!) 107/52, pulse 62, temperature 97.9 F (36.6 C), temperature source Oral, resp. rate 18, height 5' 7" (1.702 m), weight 74 kg, SpO2 95 %.  Filed Weights   09/02/19 0404 09/03/19 0635 09/04/19 0516  Weight: 76.5 kg 72.1 kg 74 kg     Physical Exam  Constitutional: He is oriented to person, place, and time. He appears well-developed and well-nourished.  HENT:  Head: Normocephalic and atraumatic.  Neck: No JVD present.  Cardiovascular: Normal rate and regular rhythm.  Murmur heard. 5/6 systolic murmur  Pulmonary/Chest: Effort normal.  Respirations unlabored, course sounds in bases  Abdominal: Soft. Bowel sounds are normal.  Musculoskeletal:        General: No edema.     Comments: Right lower leg wound, closed  Neurological: He is alert and oriented to person, place, and time. He has normal reflexes.  Skin: Skin is warm and dry.  Psychiatric: He has a normal mood and affect.   Right groin site C/D/I, no hematoma, covered Left PPM pocket site with steri strips, some oozing - defer to EP   Labs & Radiologic Studies    CBC Recent Labs    09/03/19 0021   09/03/19 1008 09/04/19 0354  WBC 7.2  --   --  7.1  HGB 9.2*   < > 8.2* 8.9*  HCT 29.0*   < > 24.0* 28.5*  MCV 96.0  --   --  98.3  PLT 122*  --   --  121*   < > = values in this interval not displayed.   Basic Metabolic Panel Recent Labs    09/03/19 0021  09/03/19 1008 09/04/19 0354  NA 136   < > 139 139  K 4.7   < > 4.0 4.9  CL 100  --   --  102  CO2 31  --   --  29  GLUCOSE 180*  --   --  168*  BUN 33*  --   --  31*  CREATININE 1.76*  --   --  1.67*  CALCIUM 9.0  --   --  8.9   < > = values in this interval not displayed.   Liver Function Tests No results for input(s): AST, ALT, ALKPHOS, BILITOT, PROT, ALBUMIN in the last 72 hours. No results for input(s): LIPASE, AMYLASE in the last 72 hours. High Sensitivity Troponin:   Recent Labs  Lab 08/29/19 1545 08/29/19 1727 08/29/19 1751 08/29/19 2004  TROPONINIHS 505* 544* 528* 427*    BNP Invalid input(s): POCBNP D-Dimer No results for input(s): DDIMER in the last 72 hours. Hemoglobin A1C No results for input(s): HGBA1C in the last 72 hours. Fasting Lipid Panel No results for input(s): CHOL, HDL, LDLCALC, TRIG, CHOLHDL, LDLDIRECT in the last 72 hours. Thyroid Function Tests No results for input(s): TSH, T4TOTAL, T3FREE, THYROIDAB in the last 72 hours.  Invalid input(s): FREET3 _____________  Dg Chest 2 View  Result Date: 08/31/2019 CLINICAL DATA:  Pacemaker. Pt cannot lift both arms EXAM: CHEST - 2 VIEW COMPARISON:  08/29/2019 FINDINGS: Patient has new LEFT-sided transvenous pacemaker with leads to the RIGHT atrium and RIGHT ventricle. No pneumothorax. Heart size is mildly enlarged. Status post median sternotomy and CABG. Patchy opacity at the RIGHT lung base appear stable. No pulmonary edema. Visualized bowel gas pattern is nonobstructed. Chronic changes in both shoulders. IMPRESSION: Interval placement of LEFT-sided transvenous pacemaker. No pneumothorax. Electronically Signed   By: Nolon Nations M.D.   On:  08/31/2019 08:54   Dg Chest Portable 1 View  Result Date: 08/29/2019 CLINICAL DATA:  Chest pain. EXAM: PORTABLE CHEST 1 VIEW COMPARISON:  July 01, 2019 FINDINGS: External pacing pads are in place. Prior postsurgical changes from CABG. The cardiac silhouette is enlarged. Low lung volumes. Right lower lobe atelectasis versus airspace consolidation. Small right pleural effusion. Osteoarthritic changes of bilateral shoulders. IMPRESSION: Low lung volumes with right lower  lobe atelectasis versus airspace consolidation and small right pleural effusion. Apparent enlargement of the cardiac silhouette. Electronically Signed   By: Fidela Salisbury M.D.   On: 08/29/2019 16:11   Disposition   Pt is being discharged home today in good condition.  Follow-up Plans & Appointments    Follow-up Information    Evans Lance, MD Follow up on 12/02/2019.   Specialty: Cardiology Why: at 315 pm for 3 month pacemaker check  Contact information: [address] [place] [address] [phone]        [facility] Follow up on 09/10/2019.   Why: at 3 pm for post pacemaker wound check  Contact information: [place] [id number] [place], [place], [place] Follow up on 09/10/2019.   Specialty: Cardiology Why: 10:45 am for [facility] appt (no appts in [place]) Contact information: [place] [address] [phone]          Discharge Instructions    Diet - low sodium heart healthy   Complete by: As directed    Discharge instructions   Complete by: As directed    No driving for 1 week. No lifting over 5 lbs for 1 week. No sexual activity for 1 week.  Keep procedure site clean & dry. If you notice increased pain, swelling, bleeding or pus, call/return!  You may shower, but no soaking baths/hot tubs/pools for 1 week.   Increase activity slowly   Complete by:  As directed       Discharge Medications   Allergies as of 09/04/2019      Reactions   Sulfa Antibiotics Rash      Medication List    STOP taking these medications   cephALEXin 500 MG capsule Commonly known as: [place] these medications   amitriptyline 50 MG tablet Commonly known as: ELAVIL TAKE 1 AND 1/2 TABLETS BY MOUTH AT BEDTIME. What changed:   how much to take  how to take this  when to take this  additional instructions   aspirin EC 81 MG tablet Take 81 mg by mouth daily.   atorvastatin 80 MG tablet Commonly known as: LIPITOR TAKE ONE TABLET BY MOUTH ONCE DAILY.   blood glucose meter kit and supplies Kit Dispense based on patient and insurance preference. Use up to 3 times daily as directed. DX:E11.9   clonazePAM 0.25 MG disintegrating tablet Commonly known as: KLONOPIN DISSOLVE 1 TABLET BY MOUTH DAILY AS NEEDED FOR ANXIETY. What changed:   how much to take  how to take this  when to take this  reasons to take this  additional instructions   docusate sodium 100 MG capsule Commonly known as: COLACE Take 100 mg by mouth 3 (three) times daily.   DULoxetine 30 MG capsule Commonly known as: CYMBALTA Take 30 mg by mouth daily.   finasteride 5 MG tablet Commonly known as: PROSCAR Take 5 mg by mouth daily.   hydroxychloroquine 200 MG tablet Commonly known as: PLAQUENIL Take 200 mg by mouth daily.   insulin aspart protamine- aspart (70-30) 100 UNIT/ML injection Commonly known as: NOVOLOG MIX 70/30 Inject 12 units into skin in the morning  and 8 units evening What changed:   how much to take  how to take this  when to take this  additional instructions   methadone 10 MG tablet Commonly known as: DOLOPHINE Take 10 mg  by mouth every 6 (six) hours.   methadone 5 MG tablet Commonly known as: DOLOPHINE Take 5 mg by mouth 3 (three) times daily as needed (breakthrough pain).   nitroGLYCERIN 0.4 MG SL tablet Commonly known as:  NITROSTAT Place 1 tablet (0.4 mg total) under the tongue every 5 (five) minutes x 3 doses as needed for chest pain.   polyethylene glycol powder 17 GM/SCOOP powder Commonly known as: MiraLax Take 17 g by mouth 2 (two) times daily.   predniSONE 5 MG tablet Commonly known as: DELTASONE TAKE 1 TABLET BY MOUTH ONCE A DAY WITH BREAKFAST. What changed: See the new instructions.   pregabalin 25 MG capsule Commonly known as: LYRICA TAKE ONE CAPSULE BY MOUTH AT BEDTIME.   REFRESH OP Place 1 drop into both eyes daily as needed (dry eyes).   senna 8.6 MG Tabs tablet Commonly known as: SENOKOT Take 1 tablet by mouth 2 (two) times daily.   tamsulosin 0.4 MG Caps capsule Commonly known as: FLOMAX Take 1 capsule (0.4 mg total) by mouth at bedtime.   torsemide 10 MG tablet Commonly known as: DEMADEX TAKE 2 TABLETS BY MOUTH EVERY OTHER DAY. What changed:   how much to take  how to take this  when to take this  additional instructions   Vitamin D3 25 MCG (1000 UT) Chew Chew 1 tablet by mouth daily.        Acute coronary syndrome (MI, NSTEMI, STEMI, etc) this admission?:  No.  The elevated Troponin was due to the acute medical illness or demand ischemia.    Outstanding Labs/Studies   BMP Continue TAVR workup  Duration of Discharge Encounter   Greater than 30 minutes including physician time.  Signed, Tami Lin Jihaad Bruschi, PA 09/04/2019, 1:52 PM  Patient seen, examined. Available data reviewed. Agree with findings, assessment, and plan as outlined by Doreene Adas, PA.  On exam, the patient is alert, oriented, in no distress.  Lungs are clear, JVP is normal, chest exam shows a stable soft hematoma at his pacemaker site, heart is regular rate and rhythm with a grade 3/6 harsh systolic murmur at the right upper sternal border with no diastolic murmur, abdomen is soft and nontender, extremities show a left BKA and a right pretibial scabbed area.  His right foot exam demonstrates  multiple toe amputations that are well-healed with a pinpoint black lesion at the tip of his great toe.  He is having no pain in the foot or leg.  From a cardiac perspective, he appears stable and ready for discharge.  He will continue with his outpatient TAVR work-up with upcoming CT angiogram studies.  We plan on medical therapy for his coronary artery disease.  He has been seen by the EP team and is felt to be stable from their perspective as well.  Medications as outlined above.  Outpatient follow-up has been arranged.  The patient will be ready for discharge home after he is evaluated by the wound care nurse.  Sherren Mocha, M.D. 09/04/2019 1:52 PM

## 2019-09-04 NOTE — Progress Notes (Signed)
Pt hematoma re-assessed.  Stable. No further bleeding at site. Small, soft hematoma persists along inferior border of his PPM.  Pocket Pal device placed on patient (pressure dressing) with instructions to wear as often as possible leading up to visit 9/15.   Shirley Friar, PA-C  Pager: [phone]  09/04/2019 10:50 AM

## 2019-09-04 NOTE — TOC Initial Note (Signed)
Transition of Care [facility]) - Initial/Assessment Note    Patient Details  Name: [name] MRN: [id number] Date of Birth: [birth year]  Transition [facility]) CM/SW Contact:    Zenon Mayo, RN Phone Number: 09/04/2019, 2:22 PM  Clinical Narrative:                 Patient for dc home, he has transportation and no problem getting meds.  He has a walker at home. No needs at dc.  Expected Discharge Plan: Home/Self Care Barriers to Discharge: No Barriers Identified   Patient Goals and CMS Choice Patient states their goals for this hospitalization and ongoing recovery are:: to be able to walk with his prosthetic better   Choice offered to / list presented to : NA  Expected Discharge Plan and Services Expected Discharge Plan: Home/Self Care In-house Referral: NA Discharge Planning Services: CM Consult Post Acute Care Choice: NA Living arrangements for the past 2 months: Single Family Home Expected Discharge Date: 09/04/19               DME Arranged: (NA)         HH Arranged: NA          Prior Living Arrangements/Services Living arrangements for the past 2 months: Single Family Home Lives with:: Spouse Patient language and need for interpreter reviewed:: Yes Do you feel safe going back to the place where you live?: Yes      Need for Family Participation in Patient Care: Yes (Comment) Care giver support system in place?: Yes (comment)   Criminal Activity/Legal Involvement Pertinent to Current Situation/Hospitalization: No - Comment as needed  Activities of Daily Living Home Assistive Devices/Equipment: Grab bars around toilet, Grab bars in shower, Shower chair with back, Cane (specify quad or straight) ADL Screening (condition at time of admission) Patient's cognitive ability adequate to safely complete daily activities?: Yes Is the patient deaf or have difficulty hearing?: No Does the patient have difficulty seeing, even when wearing glasses/contacts?:  No Does the patient have difficulty concentrating, remembering, or making decisions?: No Patient able to express need for assistance with ADLs?: Yes Does the patient have difficulty dressing or bathing?: Yes Independently performs ADLs?: No Communication: Independent Dressing (OT): Needs assistance Is this a change from baseline?: Pre-admission baseline Grooming: Needs assistance Is this a change from baseline?: Pre-admission baseline Feeding: Needs assistance Is this a change from baseline?: Pre-admission baseline Bathing: Needs assistance Is this a change from baseline?: Pre-admission baseline Toileting: Needs assistance Is this a change from baseline?: Pre-admission baseline In/Out Bed: Needs assistance Is this a change from baseline?: Pre-admission baseline Walks in Home: Needs assistance Is this a change from baseline?: Pre-admission baseline Does the patient have difficulty walking or climbing stairs?: Yes Weakness of Legs: Both Weakness of Arms/Hands: Both  Permission Sought/Granted                  Emotional Assessment Appearance:: Appears stated age Attitude/Demeanor/Rapport: Gracious(wife) Affect (typically observed): Appropriate(wife) Orientation: : Oriented to Self, Oriented to Place, Oriented to  Time, Oriented to Situation Alcohol / Substance Use: Not Applicable Psych Involvement: No (comment)  Admission diagnosis:  Heart block AV third degree ([place]) [I44.2] Severe comorbid illness [R69] Patient Active Problem List   Diagnosis Date Noted  . Chronic diastolic heart failure ([place]) 09/04/2019  . Pressure injury of skin 09/04/2019  . Pacemaker   . Severe aortic stenosis by prior echocardiogram   . Unstable angina ([place]) 08/29/2019  . Heart block AV  third degree ([place])   . Opioid dependence on maintenance agonist therapy, no symptoms ([place]) 07/01/2019  . Elevated troponin 07/01/2019  . Hypotension 07/01/2019  . LBBB (left bundle branch block) 07/01/2019  .  Cardiogenic shock ([place]) 07/01/2019  . Current chronic use of systemic steroids 07/01/2019  . Urinary tract infection without hematuria 10/15/2016  . Sepsis ([place]) 10/15/2016  . Loculated pleural effusion 09/09/2016  . Shortness of breath 09/08/2016  . Acute on chronic combined systolic and diastolic CHF (congestive heart failure) ([place]) 09/02/2016  . Acute renal failure superimposed on stage 3 chronic kidney disease ([place]) 09/02/2016  . HCAP (healthcare-associated pneumonia) 09/02/2016  . Hyperkalemia 09/02/2016  . Sacral decubitus ulcer 09/02/2016  . Pressure ulcer 08/27/2016  . Acute hematogenous osteomyelitis of left foot ([place]) 08/09/2016  . Cellulitis and abscess of lower extremity 05/29/2016  . Anemia of chronic disease 04/27/2016  . Status post amputation of toe of left foot ([place]) 04/27/2016  . Systolic heart failure ([place]) 07/09/2015  . Bradycardia 08/14/2014  . Prolonged QT interval 08/14/2014  . Gait disorder 08/14/2014  . Seizure ([place]) 08/13/2014  . Pancytopenia ([place]) 08/13/2014  . ARF (acute renal failure) ([place]) 08/13/2014  . Altered mental status 06/18/2014  . Closed dislocation of metatarsophalangeal (joint) 03/26/2013  . Insulin dependent diabetes mellitus ([place]) 12/11/2012  . Osteomyelitis of metatarsal ([place]) 12/11/2012  . Hyperlipidemia 12/11/2012  . Coronary artery disease   . Rheumatoid arthritis ([place])   . Hypertension    PCP:  [name], MD Pharmacy:   [place], [place] [address] Phone: [phone] Fax: [phone]  [facility] [place], [place] [address] [place] [address] Phone: [phone] Fax: [phone]     Social Determinants of Health (SDOH) Interventions    Readmission Risk Interventions Readmission Risk Prevention Plan 09/04/2019 07/03/2019  Transportation Screening Complete Complete  Medication Review ([place]) Complete Complete  PCP or Specialist appointment within 3-5 days of discharge Complete Complete  HRI or [place] Complete Complete  SW Recovery Care/Counseling Consult Complete Complete  Palliative Care Screening Not Applicable Not [place] Not Applicable Not Applicable  Some recent data might be hidden

## 2019-09-04 NOTE — Consult Note (Signed)
[facility] Nurse wound consult note Reason for Consult:Nonhealing abrasion to right anterior lower leg.  Spouse at bedside has been using antibiotic cream daily  Left BKA is noted.  Wound type:trauma, nonhealing Pressure Injury POA: NA Measurement: scabbed 1 cm x 0.3 cm lesion Wound RAQ:[id number] Drainage (amount, consistency, odor) none Periwound: no erythema or induration noted Dressing procedure/placement/frequency:Mupirocin ointment daily. Send home with patient. Will not follow at this time.  Please re-consult if needed.  Domenic Moras MSN, RN, FNP-BC CWON Wound, Ostomy, Continence Nurse Pager [phone]

## 2019-09-04 NOTE — Plan of Care (Signed)

## 2019-09-04 NOTE — Telephone Encounter (Signed)
  Patient has TOC appt on 09/10/19 @ 10:45am with Bhagat per Fabian Sharp

## 2019-09-04 NOTE — Consult Note (Signed)
   [facility] CM Inpatient Consult   09/04/2019  [name] January 05, 1951 [id number]  Patient screened for extreme high risk score for unplanned readmission score  andto check if potential [facility] services are needed.  Patient in the [place].   Review of patient's medical record reveals from MD History and Physical in the patient profile note that the  patient is 68 y.o. male with h/o CAD s/p CABG, PAD s/p BKA, RA, HTN, HLD, severe aortic stenosis and complete heart block who was admitted last week with chest pain and complete heart block. He is now s/p pacemaker.   Primary Care Provider is  Baltazar Apo, MD [place] and this provider is listed to provide the [facility] follow up.   Plan:  Post hospital follow up for complex disease management.    For questions contact:   Natividad Brood, RN BSN [facility] Liaison  [phone] business mobile phone Toll free office [phone]  Fax number: [phone] [email or web address]

## 2019-09-05 NOTE — Telephone Encounter (Signed)
Patient contacted regarding discharge from [facility] on 09/04/2019.  Patient understands to follow up with Robbie Lis PA on 09/10/19 at 10:45 am at [address]. Patient understands discharge instructions? yes Patient understands medications and regiment? yes Patient understands to bring all medications to this visit? yes

## 2019-09-06 ENCOUNTER — Ambulatory Visit: Payer: Medicare HMO | Admitting: Physician Assistant

## 2019-09-06 ENCOUNTER — Encounter (HOSPITAL_COMMUNITY): Payer: Self-pay | Admitting: Internal Medicine

## 2019-09-10 ENCOUNTER — Other Ambulatory Visit: Payer: Self-pay

## 2019-09-10 ENCOUNTER — Ambulatory Visit (INDEPENDENT_AMBULATORY_CARE_PROVIDER_SITE_OTHER): Payer: Medicare HMO | Admitting: Student

## 2019-09-10 ENCOUNTER — Encounter: Payer: Self-pay | Admitting: Physician Assistant

## 2019-09-10 ENCOUNTER — Ambulatory Visit (INDEPENDENT_AMBULATORY_CARE_PROVIDER_SITE_OTHER): Payer: Medicare HMO | Admitting: *Deleted

## 2019-09-10 VITALS — BP 120/60 | HR 94 | Ht 67.0 in | Wt 175.0 lb

## 2019-09-10 DIAGNOSIS — I5042 Chronic combined systolic (congestive) and diastolic (congestive) heart failure: Secondary | ICD-10-CM | POA: Diagnosis not present

## 2019-09-10 DIAGNOSIS — I350 Nonrheumatic aortic (valve) stenosis: Secondary | ICD-10-CM | POA: Diagnosis not present

## 2019-09-10 DIAGNOSIS — I739 Peripheral vascular disease, unspecified: Secondary | ICD-10-CM

## 2019-09-10 DIAGNOSIS — I10 Essential (primary) hypertension: Secondary | ICD-10-CM | POA: Diagnosis not present

## 2019-09-10 DIAGNOSIS — N179 Acute kidney failure, unspecified: Secondary | ICD-10-CM | POA: Diagnosis not present

## 2019-09-10 DIAGNOSIS — E118 Type 2 diabetes mellitus with unspecified complications: Secondary | ICD-10-CM | POA: Diagnosis not present

## 2019-09-10 DIAGNOSIS — I442 Atrioventricular block, complete: Secondary | ICD-10-CM

## 2019-09-10 DIAGNOSIS — Z794 Long term (current) use of insulin: Secondary | ICD-10-CM | POA: Diagnosis not present

## 2019-09-10 LAB — CUP PACEART INCLINIC DEVICE CHECK
Battery Remaining Longevity: 131 mo
Battery Voltage: 3.21 V
Brady Statistic AP VP Percent: 14.07 %
Brady Statistic AP VS Percent: 0.5 %
Brady Statistic AS VP Percent: 65.83 %
Brady Statistic AS VS Percent: 19.6 %
Brady Statistic RA Percent Paced: 14.78 %
Brady Statistic RV Percent Paced: 79.93 %
Date Time Interrogation Session: 20200915173635
Implantable Lead Implant Date: 20200904
Implantable Lead Implant Date: 20200904
Implantable Lead Location: 753859
Implantable Lead Location: 753860
Implantable Lead Model: 3830
Implantable Lead Model: 5076
Implantable Pulse Generator Implant Date: 20200904
Lead Channel Impedance Value: 247 Ohm
Lead Channel Impedance Value: 304 Ohm
Lead Channel Impedance Value: 456 Ohm
Lead Channel Impedance Value: 608 Ohm
Lead Channel Pacing Threshold Amplitude: 1 V
Lead Channel Pacing Threshold Amplitude: 1.5 V
Lead Channel Pacing Threshold Pulse Width: 0.4 ms
Lead Channel Pacing Threshold Pulse Width: 0.4 ms
Lead Channel Sensing Intrinsic Amplitude: 2.875 mV
Lead Channel Sensing Intrinsic Amplitude: 23.25 mV
Lead Channel Setting Pacing Amplitude: 3.5 V
Lead Channel Setting Pacing Amplitude: 4.75 V
Lead Channel Setting Pacing Pulse Width: 0.4 ms
Lead Channel Setting Sensing Sensitivity: 2.8 mV

## 2019-09-10 NOTE — Progress Notes (Signed)
Cardiology Office Note:    Date:  09/10/2019   ID:  [name], DOB 1951-01-01, MRN [id number]  PCP:  [name], MD  Cardiologist:  Carlyle Dolly, MD  Electrophysiologist:  None   Referring MD: [name], MD   Chief Complaint: hospital follow-up   History of Present Illness:    [name] is a 68 y.o. male with a history of CAD s/p CABG x3 in 06/2015, chronic combined CHF with EF as low as 30-35% but improved to 60-65% on Echo in 06/2019, severe aortic stenosis, hypertension, diabetes mellitus, post-op atrial fibrillation following CABG with no recurrence (not on anticoagulation), PAD s/p left below the knee amputation, CKD stage III, chronic back/neck pain, and rheumatoid arthritis who is followed by Dr. [name].   Patient was admitted to [facility] in 06/2019 for chest pain and was found to be hypotensive and bradycardic. He was treated with atropine, bicarb, epinephrine, and fluid resuscitation with good response. Echo on 07/02/2019 showed LVEF of 60-65% with thickened/calcified aortic valve leaflet with limited mobility. The mean gradient is 16.8 mmHg, peak gradient 27.5 mmHg, AVA 0.79 cm2, dimensionless index 0.29. Patient also noted to have AK with creatinine peaking at 3.28 (improved to 1.65 at discharge) and elevated LFTs. Outpatient stress test was peformed on 07/10/2019 and showed  medium defect of mild severity present in the mid inferoseptal and mid inferior location. There was signficant gut uptake which was likely causing this defect and no large ischemic territories were noted. Was read as intermediate-risk due to reduced EF but this was normal by recent echocardiogram.   He was recently seen by Dr. Angelena Form in our [facility] on 08/29/2019 to discuss possible TAVR. At that visit, he reported chest pain concerning for unstable angina. Patient ended up being sent over the the ED for admission by Cardiology team. EKG on arrival showed complete heart rate with  junctional escape at 36 bpm. He underwent temporary pacemaker on 08/29/2019 and then had permanent pacemaker place on 08/30/2019. He tolerated this well. No pneumothorax on chest x-ray the next day. He underwent right/left heart cath on 09/03/2019 which showed patent LIMA-LAD, SVG-RI with moderate ostial and proximal disease in the SVG-RPDA not a good PCI target. No intervention. Normal right heart cath pressures and normal cardiac output. Continue TAVR work up as outpatient was recommended. Following cardiac catheterization he was noted to have bleeding at [facility] site. EP re-evaluated patient and noted soft hematoma with some bleeding. Manual pressure and pressure dressing were applied.  He presents today for hospital follow-up. Patient doing relatively well today for a cardiac standpoint. His biggest complaint is a productive cough with thick grey/green phlegm that he describes as a "paste". He states he has had this cough for about 2 years. His PCP has tried multiple antibiotics in the past with no relief. He reports some brief sharp epigastric pain when "coughing/breathing" hard that resolves within seconds. No real chest pain. He also reports a little more shortness of breath recently at rest and possible orthopnea the last couple of days. No PND. He has some occasional swelling of his right lower extremity with no significant improvement with elevation of leg. Weight up 12 lbs since discharge on 09/04/2019. No palpitations, lightheadedness, dizziness, or syncope. No abnormal bleeding.    Past Medical History:  Diagnosis Date  . Aortic stenosis   . Chronic back pain   . Chronic combined systolic (congestive) and diastolic (congestive) heart failure (HCC)  a. EF 30-35% in 2016 b. at 30-35% by echo in 08/2016 c. 06/2019: echo showing EF of 60-65% but found to have severe AS.   [name] Chronic neck pain   . CKD (chronic kidney disease), stage III ([place])   . Complete heart block ([place]) 08/29/2019   a. s/p Medtronic  dual chamber pacemeker implantation on 08/30/2019.  [name] Coronary artery disease    a. s/p CABG in 06/2015 with LIMA-LAD, SVG-PDA, SVG-Intermediate  . Diabetes mellitus   . Diabetic neuropathy ([place])   . Hyperkalemia   . Hypertension   . Loculated pleural effusion 09/09/2016  . Pancytopenia ([place]) 08/13/2014  . 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  . Rheumatoid arthritis(714.0)   . Seizure ([place]) 08/13/2014   Pt denies    Past Surgical History:  Procedure Laterality Date  . 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 CARDIAC SURGERY    . FRACTURE SURGERY    . MUSCLE BIOPSY  06/2016  . PACEMAKER IMPLANT N/A 08/30/2019   Procedure: PACEMAKER IMPLANT;  Surgeon: Evans Lance, MD;  Location: [place] [facility];  Service: Cardiovascular;  Laterality: N/A;  . right foot surgery-Toe amputations    . RIGHT/LEFT HEART CATH AND CORONARY/GRAFT ANGIOGRAPHY N/A 09/03/2019   Procedure: RIGHT/LEFT HEART CATH AND CORONARY/GRAFT ANGIOGRAPHY;  Surgeon: Leonie Man, MD;  Location: [place] [facility];  Service: Cardiovascular;  Laterality: N/A;  . TEMPORARY PACEMAKER N/A 08/29/2019   Procedure: TEMPORARY PACEMAKER;  Surgeon: Sherren Mocha, MD;  Location: [place] [facility];  Service: Cardiovascular;  Laterality: N/A;  . TOTAL HIP ARTHROPLASTY Left     Current Medications: Current Meds  Medication Sig  . amitriptyline (ELAVIL) 50 MG tablet TAKE 1 AND 1/2 TABLETS BY MOUTH AT BEDTIME.  [name] aspirin EC 81 MG tablet Take 81 mg by mouth daily.  [name] atorvastatin (LIPITOR) 80 MG tablet TAKE ONE TABLET BY MOUTH ONCE DAILY.  . blood glucose meter kit and supplies KIT Dispense based on patient and insurance preference. Use up to 3 times daily as directed. DX:E11.9  . Cholecalciferol (VITAMIN D3) 25 MCG (1000 UT) CHEW Chew 1 tablet by mouth daily.  . clonazePAM (KLONOPIN) 0.25 MG disintegrating tablet DISSOLVE 1 TABLET BY MOUTH DAILY AS NEEDED FOR ANXIETY.  [name]  docusate sodium (COLACE) 100 MG capsule Take 100 mg by mouth 3 (three) times daily.   . DULoxetine (CYMBALTA) 30 MG capsule Take 30 mg by mouth daily.   . finasteride (PROSCAR) 5 MG tablet Take 5 mg by mouth daily.   . hydroxychloroquine (PLAQUENIL) 200 MG tablet Take 200 mg by mouth daily.   . insulin aspart protamine- aspart (NOVOLOG MIX 70/30) (70-30) 100 UNIT/ML injection Inject 12 units into skin in the morning  and 8 units evening  . methadone (DOLOPHINE) 10 MG tablet Take 10 mg by mouth every 6 (six) hours.   . methadone (DOLOPHINE) 5 MG tablet Take 5 mg by mouth 3 (three) times daily as needed (breakthrough pain).   . nitroGLYCERIN (NITROSTAT) 0.4 MG SL tablet Place 1 tablet (0.4 mg total) under the tongue every 5 (five) minutes x 3 doses as needed for chest pain.  . polyethylene glycol powder (MIRALAX) powder Take 17 g by mouth 2 (two) times daily.  . Polyvinyl Alcohol-Povidone (REFRESH OP) Place 1 drop into both eyes daily as needed (dry eyes).  . predniSONE (DELTASONE) 5 MG tablet TAKE 1 TABLET BY MOUTH ONCE A DAY WITH  BREAKFAST.  [name] pregabalin (LYRICA) 25 MG capsule TAKE ONE CAPSULE BY MOUTH AT BEDTIME.  [name] senna (SENOKOT) 8.6 MG TABS tablet Take 1 tablet by mouth 2 (two) times daily.  . tamsulosin (FLOMAX) 0.4 MG CAPS capsule Take 1 capsule (0.4 mg total) by mouth at bedtime.  . torsemide (DEMADEX) 10 MG tablet TAKE 2 TABLETS BY MOUTH EVERY OTHER DAY.     Allergies:   Sulfa antibiotics   Social History   Socioeconomic History  . Marital status: Married    Spouse name: Not on file  . Number of children: 2  . Years of education: Not on file  . Highest education level: Not on file  Occupational History  . Occupation: Retired Barista  . Financial resource strain: Not on file  . Food insecurity    Worry: Not on file    Inability: Not on file  . Transportation needs    Medical: Not on file    Non-medical: Not on file  Tobacco Use  . Smoking status: Never  Smoker  . Smokeless tobacco: Never Used  Substance and Sexual Activity  . Alcohol use: No  . Drug use: No  . Sexual activity: Not on file  Lifestyle  . Physical activity    Days per week: Not on file    Minutes per session: Not on file  . Stress: Not on file  Relationships  . Social Herbalist on phone: Not on file    Gets together: Not on file    Attends religious service: Not on file    Active member of club or organization: Not on file    Attends meetings of clubs or organizations: Not on file    Relationship status: Not on file  Other Topics Concern  . Not on file  Social History Narrative  . Not on file     Family History: The patient's family history includes Dementia in his father; Diabetes in his father; Heart attack in his brother; Hypertension in his mother; Stroke in his mother.  ROS:   Please see the history of present illness.    All other systems reviewed and are negative.  EKGs/Labs/Other Studies Reviewed:    The following studies were reviewed today:  Echocardiogram 07/02/2019: Impressions: 1. The left ventricle has normal systolic function with an ejection fraction of 60-65%. The cavity size was normal. Left ventricular diastolic Doppler parameters are consistent with pseudonormalization.  2. The right ventricle has normal systolic function. The cavity was normal. There is no increase in right ventricular wall thickness.  3. The mitral valve is abnormal. Mild thickening of the mitral valve leaflet. There is moderate mitral annular calcification present.  4. The aortic valve is abnormal. Severely thickening of the aortic valve. Severe calcifcation of the aortic valve. Aortic valve regurgitation is mild to moderate by color flow Doppler. Severe stenosis of the aortic valve.  5. The aortic root and ascending aorta are normal in size and structure.  6. The interatrial septum was not assessed. _______________  Pacemaker Implant  08/30/2019:  Conclusions:  1. Successful implantation of a medtronic dual-chamber pacemaker for symptomatic bradycardia due to complete heart block  2. No early apparent complications.  _______________  Right/Left Cardiac Catheterization 09/03/2019:  Ost RCA to Dist RCA lesion is 100% stenosed.  Dist LM to Mid LAD lesion is 100% stenosed.  Ramus lesion is 100% stenosed.  1st Mrg lesion is 65% stenosed.  GRAFTS  LIMA-LAD graft was visualized by  angiography and is normal in caliber. The graft exhibits no disease.  SVG-RI graft was visualized by angiography and is normal in caliber. The graft exhibits no disease.  SVG-rPDA graft was visualized by angiography and is large. Origin lesion is 50% stenosed.  RPDA lesion is 75% stenosed.  ---------------------------------------  Hemodynamic findings consistent with aortic valve stenosis. There is severe aortic valve stenosis by echo, but by cath moderate to severe  LV end diastolic pressure is normal.  The left ventricular systolic function is normal with normal cardiac output index.   Summary:  Severe 4-vessel native CAD with 100% flush CTO of the LAD, RI and RCA with 60% stenosis of ostial OM1 and ostial PDA (fill via SVG-PDA)  Widely patent LIMA-LAD, SVG-RI with moderate ostial and proximal disease in SVG-RPDA (retrograde flow from RPDA 2 RPL system somewhat jeopardized by ostial 75% PDA lesion-not PCI target)  Echocardiographically Severe Aortic Stenosis -> with direct measurements of P-P pressure 38 million mercury and mean 31 mmHg.  Normal CARDIAC OUTPUT & CARDIAC INDEX  Relatively normal RIGHT HEART CATH pressures  Recommendations:  Patient will return to his nursing unit for ongoing care after sheath removal in PACU holding area  Proceed with structural heart disease team evaluation for TAVR  Continue risk factor modification  _______________  EKG:  EKG is not ordered today.   Recent Labs: 10/18/2018: TSH 1.660 07/01/2019:  B Natriuretic Peptide 3,872.1 07/03/2019: ALT 327 09/04/2019: BUN 31; Creatinine, Ser 1.67; Hemoglobin 8.9; Platelets 121; Potassium 4.9; Sodium 139  Recent Lipid Panel    Component Value Date/Time   CHOL 115 08/30/2019 0215   CHOL 142 10/18/2018 1546   TRIG 43 08/30/2019 0215   HDL 48 08/30/2019 0215   HDL 68 10/18/2018 1546   CHOLHDL 2.4 08/30/2019 0215   VLDL 9 08/30/2019 0215   LDLCALC 58 08/30/2019 0215   LDLCALC 59 10/18/2018 1546    Physical Exam:    Vital Signs: BP 120/60   Pulse 94   Ht 5' 7" (1.702 m)   Wt 175 lb (79.4 kg)   SpO2 97%   BMI 27.41 kg/m     Wt Readings from Last 3 Encounters:  09/10/19 175 lb (79.4 kg)  09/04/19 163 lb 2.3 oz (74 kg)  08/29/19 180 lb (81.6 kg)    General: 68 y.o. male in no acute distress. Weight up 12 lbs since discharge. HEENT: Normocephalic and atraumatic. Sclera clear. EOMs intact. Neck: Supple. No carotid bruits. No JVD. Heart: RRR. Distinct S1 and S2. Harsh III/VI systolic murmur. No gallops or rubs. Right femoral cath site soft with echymosis and mildly tender to the touch but no signs of hematoma. Dry dressing over PPM site with no significant surrounding erythema.  Lungs: No increased work of breathing. Diminished breath sounds in bilaterally bases with some faint crackles. No rhonchi or wheezes.  Abdomen: Soft, non-distended, and non-tender to palpation.  MSK: S/p left below the knee amputation. Arthritic deformation of bilateral hands due to rheumatoid arthritis.  Extremities: No lower extremity edema.    Skin: Warm and dry. Neuro: Alert and oriented x3. No focal deficits. Psych: Normal affect. Responds appropriately.  ASSESSMENT:    1. Complete heart block s/p PPM   2. Chronic combined systolic and diastolic heart failure ([place])   3. Severe aortic stenosis   4. PAD (peripheral artery disease) ([place])   5. Essential hypertension   6. Type 2 diabetes mellitus with complication, with long-term current use of insulin ([place])    7.  AKI (acute kidney injury) ([place])    PLAN:    Complete Heart Block s/p PPM - Patient here for hospital follow-up. Initially presented with chest pain concerning for unstable angina and was found to be in complete heart block. Underwent PPM (Metronic dual-chamber pacemaker) implantation on 08/30/2019. Doing well today. Heart rate 94. Sounds regular on exam. - Has wound check appointment with Dr. Lovena Le later this afternoon.  - Follow-up with EP as directed.  CAD s/p CABG - Cardiac catheterization on 09/03/2019 showed patent LIMA to LAD graft, patent SVG to RI graft, 50% stenosis of SVG to rPDA graft and 75% of RPDA. - Patient denies any angina but does report sharp brief episodes of epigastric pain with coughing and deep inspiration. Possible musculoskeletal in origin given persistent cough.  - Continue aggressive secondary prevention. Continue Aspirin 77m daily and Lipitor 828mdaily.  Severe Aortic Stenosis - Echo from 07/02/2019 reported severe calcification of aortic valve with severe stenosis and mild to moderate regurgitation. Moderate to severe stenosis by right heart cath on 09/03/2019. - Patient seeing Dr. McAngelena Formor work-up for possible TAVR. Structural team with waiting on renal function to stabilize before proceeding with additional imaging. Will recheck BMET today. - Follow-up with Structural Team as directed.  Chronic Combined CHF/Ischemic Cardiomyopathy - LVEF of 60-65% on Echo in 07/02/2019. - Right cardiac catheterization showed normal LVEDP, normal cardiac output and cardiac index, and relatively normal right heart pressures.  - Patient does reports more shortness of breath at rest and possible orthopnea over the last few days but does not appear volume overloaded on exam. However, weight is up 12 lbs since discharge. Diminished breath sounds in bases but no significant crackles.  - Currently on Torsemide 2053mvery other day.  - Will check BNP and BMET today before adjusting  diuretic. May need additional PRN Torsemide for shortness of breath/weight gain/edema.  Productive Cough - Patient biggest complaint today was a productive cough that he has had for 2 years. He has seen his PCP for this in the past who has tried antibiotics. I do not think this is related to his chronic CHF given duration of cough and recent heart pressures on cardiac catheterization. Recommend following up with PCP. Ambulatory referral to Pulmonology was ordered in the hospital.   Right Lower Extremity Wound - Occurred prior to hospitalization and has been on Keflex per PCP. Per patient and wife, wound is improving. Recommended continuing monitoring closely and follow-up with PCP. Will defer duration of antibiotics to PCP.  PAD - Patient s/p left below knee amputation. - Continue medical management.   Hypertension - BP well controlled at 120/60 today without any medications (other than Torsemide).  Type 2 Diabetes Mellitus with Neuropathy - Patient on Insulin at home. - Management per PCP.  CKD Stage III - Creatinine 1.67 on discharge (improved from 2.91 on admission). Seems to be around recent baseline. - Will recheck BMET today before adjusting any diuretics.   Chronic Anemia - Hemoglobin 8.9 on discharge. Baseline seem to be in 8 to 9 range. - Will recheck CBC today.  Disposition: Patient has follow-up with Dr. BraHarl Bowieheduled for 11/05/2019. Will follow-up with EP and Structural Team as directed.    Medication Adjustments/Labs and Tests Ordered: Current medicines are reviewed at length with the patient today.  Concerns regarding medicines are outlined above.  Orders Placed This Encounter  Procedures  . Basic metabolic panel  . CBC  . Pro b natriuretic peptide (BNP)   No orders  of the defined types were placed in this encounter.   Patient Instructions  Medication Instructions:  Your physician recommends that you continue on your current medications as directed. Please  refer to the Current Medication list given to you today.  If you need a refill on your cardiac medications before your next appointment, please call your pharmacy.   Lab work: TODAY:  BMET, CBC, & PRO BNP  If you have labs (blood work) drawn today and your tests are completely normal, you will receive your results only by: [name] MyChart Message (if you have MyChart) OR . A paper copy in the mail If you have any lab test that is abnormal or we need to change your treatment, we will call you to review the results.  Testing/Procedures: None ordered  Follow-Up: At [facility], you and your health needs are our priority.  As part of our continuing mission to provide you with exceptional heart care, we have created designated Provider Care Teams.  These Care Teams include your primary Cardiologist (physician) and Advanced Practice Providers (APPs -  Physician Assistants and Nurse Practitioners) who all work together to provide you with the care you need, when you need it. [name] Keep your scheduled follow-up appointments as planned.   Any Other Special Instructions Will Be Listed Below (If Applicable).    Signed, Darreld Mclean, PA-C  09/10/2019 1:18 PM    [facility]

## 2019-09-10 NOTE — Patient Instructions (Addendum)
Medication Instructions:  Your physician recommends that you continue on your current medications as directed. Please refer to the Current Medication list given to you today.  If you need a refill on your cardiac medications before your next appointment, please call your pharmacy.   Lab work: TODAY:  BMET, CBC, & PRO BNP  If you have labs (blood work) drawn today and your tests are completely normal, you will receive your results only by: [name] MyChart Message (if you have MyChart) OR . A paper copy in the mail If you have any lab test that is abnormal or we need to change your treatment, we will call you to review the results.  Testing/Procedures: None ordered  Follow-Up: At [facility], you and your health needs are our priority.  As part of our continuing mission to provide you with exceptional heart care, we have created designated Provider Care Teams.  These Care Teams include your primary Cardiologist (physician) and Advanced Practice Providers (APPs -  Physician Assistants and Nurse Practitioners) who all work together to provide you with the care you need, when you need it. [name] Keep your scheduled follow-up appointments as planned.   Any Other Special Instructions Will Be Listed Below (If Applicable).

## 2019-09-10 NOTE — Progress Notes (Signed)
Wound check appointment. Steri-strips removed. Wound without redness or edema. Incision edges approximated, wound well healed. Normal device function. Thresholds, sensing, and impedances consistent with implant measurements. Device programmed at 3.5V/auto capture programmed on for extra safety margin until 3 month visit. Histogram distribution appropriate for patient and level of activity. AT/AF burden 0.1%.. One  high ventricular rate noted , EGM appears to be NSVT that was 1 second in duration. Patient educated about wound care, arm mobility, lifting restrictions. ROV ion 12/02/2019. Next remote on 03/02/2020.

## 2019-09-11 ENCOUNTER — Observation Stay (HOSPITAL_COMMUNITY): Payer: Medicare HMO

## 2019-09-11 ENCOUNTER — Inpatient Hospital Stay (HOSPITAL_COMMUNITY)
Admission: EM | Admit: 2019-09-11 | Discharge: 2019-09-15 | DRG: 683 | Disposition: A | Discharge status: Home / Self Care | Payer: Medicare HMO | Attending: Internal Medicine | Admitting: Internal Medicine

## 2019-09-11 ENCOUNTER — Telehealth: Payer: Self-pay | Admitting: *Deleted

## 2019-09-11 ENCOUNTER — Other Ambulatory Visit: Payer: Self-pay

## 2019-09-11 ENCOUNTER — Encounter (HOSPITAL_COMMUNITY): Payer: Self-pay

## 2019-09-11 ENCOUNTER — Emergency Department (HOSPITAL_COMMUNITY): Payer: Medicare HMO

## 2019-09-11 DIAGNOSIS — N141 Nephropathy induced by other drugs, medicaments and biological substances: Secondary | ICD-10-CM | POA: Diagnosis present

## 2019-09-11 DIAGNOSIS — N179 Acute kidney failure, unspecified: Secondary | ICD-10-CM | POA: Diagnosis present

## 2019-09-11 DIAGNOSIS — B965 Pseudomonas (aeruginosa) (mallei) (pseudomallei) as the cause of diseases classified elsewhere: Secondary | ICD-10-CM | POA: Diagnosis present

## 2019-09-11 DIAGNOSIS — N183 Chronic kidney disease, stage 3 (moderate): Secondary | ICD-10-CM | POA: Diagnosis not present

## 2019-09-11 DIAGNOSIS — I10 Essential (primary) hypertension: Secondary | ICD-10-CM | POA: Diagnosis present

## 2019-09-11 DIAGNOSIS — Z96642 Presence of left artificial hip joint: Secondary | ICD-10-CM | POA: Diagnosis present

## 2019-09-11 DIAGNOSIS — Z794 Long term (current) use of insulin: Secondary | ICD-10-CM

## 2019-09-11 DIAGNOSIS — Z951 Presence of aortocoronary bypass graft: Secondary | ICD-10-CM

## 2019-09-11 DIAGNOSIS — Z992 Dependence on renal dialysis: Secondary | ICD-10-CM

## 2019-09-11 DIAGNOSIS — Z79899 Other long term (current) drug therapy: Secondary | ICD-10-CM

## 2019-09-11 DIAGNOSIS — R079 Chest pain, unspecified: Secondary | ICD-10-CM | POA: Diagnosis not present

## 2019-09-11 DIAGNOSIS — E860 Dehydration: Secondary | ICD-10-CM | POA: Diagnosis present

## 2019-09-11 DIAGNOSIS — M542 Cervicalgia: Secondary | ICD-10-CM | POA: Diagnosis present

## 2019-09-11 DIAGNOSIS — Z950 Presence of cardiac pacemaker: Secondary | ICD-10-CM

## 2019-09-11 DIAGNOSIS — Z882 Allergy status to sulfonamides status: Secondary | ICD-10-CM

## 2019-09-11 DIAGNOSIS — I5042 Chronic combined systolic (congestive) and diastolic (congestive) heart failure: Secondary | ICD-10-CM | POA: Diagnosis not present

## 2019-09-11 DIAGNOSIS — E1140 Type 2 diabetes mellitus with diabetic neuropathy, unspecified: Secondary | ICD-10-CM | POA: Diagnosis present

## 2019-09-11 DIAGNOSIS — I255 Ischemic cardiomyopathy: Secondary | ICD-10-CM | POA: Diagnosis present

## 2019-09-11 DIAGNOSIS — I2510 Atherosclerotic heart disease of native coronary artery without angina pectoris: Secondary | ICD-10-CM | POA: Diagnosis present

## 2019-09-11 DIAGNOSIS — M069 Rheumatoid arthritis, unspecified: Secondary | ICD-10-CM | POA: Diagnosis present

## 2019-09-11 DIAGNOSIS — G8929 Other chronic pain: Secondary | ICD-10-CM | POA: Diagnosis present

## 2019-09-11 DIAGNOSIS — Z20828 Contact with and (suspected) exposure to other viral communicable diseases: Secondary | ICD-10-CM | POA: Diagnosis present

## 2019-09-11 DIAGNOSIS — D631 Anemia in chronic kidney disease: Secondary | ICD-10-CM | POA: Diagnosis present

## 2019-09-11 DIAGNOSIS — T508X5A Adverse effect of diagnostic agents, initial encounter: Secondary | ICD-10-CM | POA: Diagnosis present

## 2019-09-11 DIAGNOSIS — E1122 Type 2 diabetes mellitus with diabetic chronic kidney disease: Secondary | ICD-10-CM | POA: Diagnosis not present

## 2019-09-11 DIAGNOSIS — I350 Nonrheumatic aortic (valve) stenosis: Secondary | ICD-10-CM | POA: Diagnosis present

## 2019-09-11 DIAGNOSIS — M549 Dorsalgia, unspecified: Secondary | ICD-10-CM | POA: Diagnosis present

## 2019-09-11 DIAGNOSIS — N390 Urinary tract infection, site not specified: Secondary | ICD-10-CM | POA: Diagnosis present

## 2019-09-11 DIAGNOSIS — I5030 Unspecified diastolic (congestive) heart failure: Secondary | ICD-10-CM | POA: Diagnosis not present

## 2019-09-11 DIAGNOSIS — M05712 Rheumatoid arthritis with rheumatoid factor of left shoulder without organ or systems involvement: Secondary | ICD-10-CM | POA: Diagnosis not present

## 2019-09-11 DIAGNOSIS — Z833 Family history of diabetes mellitus: Secondary | ICD-10-CM

## 2019-09-11 DIAGNOSIS — I130 Hypertensive heart and chronic kidney disease with heart failure and stage 1 through stage 4 chronic kidney disease, or unspecified chronic kidney disease: Secondary | ICD-10-CM | POA: Diagnosis not present

## 2019-09-11 DIAGNOSIS — E1151 Type 2 diabetes mellitus with diabetic peripheral angiopathy without gangrene: Secondary | ICD-10-CM | POA: Diagnosis present

## 2019-09-11 DIAGNOSIS — N189 Chronic kidney disease, unspecified: Secondary | ICD-10-CM | POA: Diagnosis not present

## 2019-09-11 DIAGNOSIS — N3000 Acute cystitis without hematuria: Secondary | ICD-10-CM | POA: Diagnosis not present

## 2019-09-11 DIAGNOSIS — I442 Atrioventricular block, complete: Secondary | ICD-10-CM | POA: Diagnosis not present

## 2019-09-11 DIAGNOSIS — Z7952 Long term (current) use of systemic steroids: Secondary | ICD-10-CM

## 2019-09-11 DIAGNOSIS — M05711 Rheumatoid arthritis with rheumatoid factor of right shoulder without organ or systems involvement: Secondary | ICD-10-CM | POA: Diagnosis not present

## 2019-09-11 DIAGNOSIS — Z7982 Long term (current) use of aspirin: Secondary | ICD-10-CM

## 2019-09-11 DIAGNOSIS — Z9861 Coronary angioplasty status: Secondary | ICD-10-CM

## 2019-09-11 DIAGNOSIS — Z89512 Acquired absence of left leg below knee: Secondary | ICD-10-CM

## 2019-09-11 HISTORY — DX: Acute kidney failure, unspecified: N17.9

## 2019-09-11 LAB — CBC
HCT: 28.2 % — ABNORMAL LOW (ref 39.0–52.0)
Hematocrit: 25.8 % — ABNORMAL LOW (ref 37.5–51.0)
Hemoglobin: 8.7 g/dL — ABNORMAL LOW (ref 13.0–17.7)
Hemoglobin: 8.6 g/dL — ABNORMAL LOW (ref 13.0–17.0)
MCH: 30.6 pg (ref 26.6–33.0)
MCH: 30.5 pg (ref 26.0–34.0)
MCHC: 33.7 g/dL (ref 31.5–35.7)
MCHC: 30.5 g/dL (ref 30.0–36.0)
MCV: 91 fL (ref 79–97)
MCV: 100 fL (ref 80.0–100.0)
Platelets: 158 10*3/uL (ref 150–450)
Platelets: 151 10*3/uL (ref 150–400)
RBC: 2.84 x10E6/uL — ABNORMAL LOW (ref 4.14–5.80)
RBC: 2.82 MIL/uL — ABNORMAL LOW (ref 4.22–5.81)
RDW: 13.2 % (ref 11.6–15.4)
RDW: 14.6 % (ref 11.5–15.5)
WBC: 8 10*3/uL (ref 3.4–10.8)
WBC: 8.3 10*3/uL (ref 4.0–10.5)
nRBC: 0 % (ref 0.0–0.2)

## 2019-09-11 LAB — BASIC METABOLIC PANEL
Anion gap: 11 (ref 5–15)
BUN/Creatinine Ratio: 13 (ref 10–24)
BUN: 43 mg/dL — ABNORMAL HIGH (ref 8–27)
BUN: 39 mg/dL — ABNORMAL HIGH (ref 8–23)
CO2: 25 mmol/L (ref 20–29)
CO2: 24 mmol/L (ref 22–32)
Calcium: 9 mg/dL (ref 8.6–10.2)
Calcium: 9.2 mg/dL (ref 8.9–10.3)
Chloride: 99 mmol/L (ref 96–106)
Chloride: 100 mmol/L (ref 98–111)
Creatinine, Ser: 3.19 mg/dL — ABNORMAL HIGH (ref 0.76–1.27)
Creatinine, Ser: 3.12 mg/dL — ABNORMAL HIGH (ref 0.61–1.24)
GFR calc Af Amer: 22 mL/min/{1.73_m2} — ABNORMAL LOW (ref >59)
GFR calc Af Amer: 22 mL/min — ABNORMAL LOW (ref >60)
GFR calc non Af Amer: 19 mL/min/{1.73_m2} — ABNORMAL LOW (ref >59)
GFR calc non Af Amer: 19 mL/min — ABNORMAL LOW (ref >60)
Glucose, Bld: 243 mg/dL — ABNORMAL HIGH (ref 70–99)
Glucose: 131 mg/dL — ABNORMAL HIGH (ref 65–99)
Potassium: 4.8 mmol/L (ref 3.5–5.2)
Potassium: 4.7 mmol/L (ref 3.5–5.1)
Sodium: 140 mmol/L (ref 134–144)
Sodium: 135 mmol/L (ref 135–145)

## 2019-09-11 LAB — PHOSPHORUS: Phosphorus: 5.2 mg/dL — ABNORMAL HIGH (ref 2.5–4.6)

## 2019-09-11 LAB — PRO B NATRIURETIC PEPTIDE: NT-Pro BNP: 15761 pg/mL — ABNORMAL HIGH (ref 0–376)

## 2019-09-11 LAB — MAGNESIUM: Magnesium: 2.3 mg/dL (ref 1.7–2.4)

## 2019-09-11 LAB — BRAIN NATRIURETIC PEPTIDE: B Natriuretic Peptide: 1766.3 pg/mL — ABNORMAL HIGH (ref 0.0–100.0)

## 2019-09-11 LAB — TROPONIN I (HIGH SENSITIVITY)
Troponin I (High Sensitivity): 48 ng/L — ABNORMAL HIGH (ref <18)
Troponin I (High Sensitivity): 46 ng/L — ABNORMAL HIGH (ref <18)

## 2019-09-11 MED ORDER — METHADONE HCL 10 MG PO TABS
10.0000 mg | ORAL_TABLET | Freq: Four times a day (QID) | ORAL | Status: DC
  Administered 2019-09-12 – 2019-09-15 (×13): 10 mg via ORAL
  Filled 2019-09-11 (×13): qty 1

## 2019-09-11 MED ORDER — INSULIN ASPART 100 UNIT/ML ~~LOC~~ SOLN
0.0000 [IU] | Freq: Three times a day (TID) | SUBCUTANEOUS | Status: DC
  Administered 2019-09-13 – 2019-09-14 (×2): 2 [IU] via SUBCUTANEOUS

## 2019-09-11 MED ORDER — TAMSULOSIN HCL 0.4 MG PO CAPS
0.4000 mg | ORAL_CAPSULE | Freq: Every day | ORAL | Status: DC
  Administered 2019-09-12 – 2019-09-14 (×3): 0.4 mg via ORAL
  Filled 2019-09-11 (×3): qty 1

## 2019-09-11 MED ORDER — PREGABALIN 25 MG PO CAPS
25.0000 mg | ORAL_CAPSULE | Freq: Every day | ORAL | Status: DC
  Administered 2019-09-12 – 2019-09-14 (×3): 25 mg via ORAL
  Filled 2019-09-11 (×3): qty 1

## 2019-09-11 MED ORDER — INSULIN ASPART PROT & ASPART (70-30 MIX) 100 UNIT/ML ~~LOC~~ SUSP
12.0000 [IU] | Freq: Every day | SUBCUTANEOUS | Status: DC
  Administered 2019-09-12 – 2019-09-15 (×4): 12 [IU] via SUBCUTANEOUS
  Filled 2019-09-11: qty 10

## 2019-09-11 MED ORDER — CLONAZEPAM 0.25 MG PO TBDP: 0.2500 mg | ORAL_TABLET | Freq: Every day | ORAL | Status: DC | PRN

## 2019-09-11 MED ORDER — ATORVASTATIN CALCIUM 80 MG PO TABS
80.0000 mg | ORAL_TABLET | Freq: Every day | ORAL | Status: DC
  Administered 2019-09-12 – 2019-09-15 (×4): 80 mg via ORAL
  Filled 2019-09-11 (×4): qty 1

## 2019-09-11 MED ORDER — AMITRIPTYLINE HCL 75 MG PO TABS
75.0000 mg | ORAL_TABLET | Freq: Every day | ORAL | Status: DC
  Administered 2019-09-13: 75 mg via ORAL
  Filled 2019-09-11 (×2): qty 1

## 2019-09-11 MED ORDER — ENOXAPARIN SODIUM 30 MG/0.3ML ~~LOC~~ SOLN
30.0000 mg | SUBCUTANEOUS | Status: DC
  Administered 2019-09-12 – 2019-09-15 (×4): 30 mg via SUBCUTANEOUS
  Filled 2019-09-11 (×5): qty 0.3

## 2019-09-11 MED ORDER — NITROGLYCERIN 0.4 MG SL SUBL: 0.4000 mg | SUBLINGUAL_TABLET | SUBLINGUAL | Status: DC | PRN

## 2019-09-11 MED ORDER — POLYETHYLENE GLYCOL 3350 17 G PO PACK
17.0000 g | PACK | Freq: Every day | ORAL | Status: DC
  Administered 2019-09-12 – 2019-09-14 (×3): 17 g via ORAL
  Filled 2019-09-11 (×3): qty 1

## 2019-09-11 MED ORDER — DOCUSATE SODIUM 100 MG PO CAPS
100.0000 mg | ORAL_CAPSULE | Freq: Three times a day (TID) | ORAL | Status: DC
  Administered 2019-09-12 – 2019-09-15 (×10): 100 mg via ORAL
  Filled 2019-09-11 (×11): qty 1

## 2019-09-11 MED ORDER — FINASTERIDE 5 MG PO TABS
5.0000 mg | ORAL_TABLET | Freq: Every day | ORAL | Status: DC
  Administered 2019-09-12 – 2019-09-15 (×4): 5 mg via ORAL
  Filled 2019-09-11 (×4): qty 1

## 2019-09-11 MED ORDER — PREDNISONE 5 MG PO TABS
5.0000 mg | ORAL_TABLET | Freq: Every day | ORAL | Status: DC
  Administered 2019-09-12 – 2019-09-15 (×4): 5 mg via ORAL
  Filled 2019-09-11 (×4): qty 1

## 2019-09-11 MED ORDER — DULOXETINE HCL 30 MG PO CPEP
30.0000 mg | ORAL_CAPSULE | Freq: Every day | ORAL | Status: DC
  Administered 2019-09-12 – 2019-09-14 (×3): 30 mg via ORAL
  Filled 2019-09-11 (×3): qty 1

## 2019-09-11 MED ORDER — ACETAMINOPHEN 325 MG PO TABS: 650.0000 mg | ORAL_TABLET | Freq: Four times a day (QID) | ORAL | Status: DC | PRN

## 2019-09-11 MED ORDER — HYDROXYCHLOROQUINE SULFATE 200 MG PO TABS
200.0000 mg | ORAL_TABLET | Freq: Every day | ORAL | Status: DC
  Administered 2019-09-12 – 2019-09-14 (×3): 200 mg via ORAL
  Filled 2019-09-11 (×4): qty 1

## 2019-09-11 MED ORDER — ASPIRIN EC 81 MG PO TBEC
81.0000 mg | DELAYED_RELEASE_TABLET | Freq: Every day | ORAL | Status: DC
  Administered 2019-09-12 – 2019-09-15 (×4): 81 mg via ORAL
  Filled 2019-09-11 (×4): qty 1

## 2019-09-11 MED ORDER — ACETAMINOPHEN 650 MG RE SUPP: 650.0000 mg | Freq: Four times a day (QID) | RECTAL | Status: DC | PRN

## 2019-09-11 NOTE — ED Notes (Signed)
Admitted MD at the bedside.

## 2019-09-11 NOTE — ED Notes (Signed)
ED TO INPATIENT HANDOFF REPORT  ED Nurse Name and Phone #:   S Name/Age/Gender [name] 68 y.o. male Room/Bed: 020C/020C  Code Status   Code Status: Prior  Home/SNF/Other Dc home AO x 4   Triage Complete: Triage complete  Chief Complaint abnormal lab  Triage Note Pt sent here by PCP for elevated BNP at PCP office. Pt states that they took blood yesterday and noted that his BNP was 15,000. PT states that they also made a comment about "having a heart attack or something" No troponin visible in chart,  Pt reports mild SOB, denies other cardiac sx.     Allergies Allergies  Allergen Reactions  . Sulfa Antibiotics Rash    Level of Care/Admitting Diagnosis ED Disposition    None      B Medical/Surgery History Past Medical History:  Diagnosis Date  . Aortic stenosis   . Chronic back pain   . Chronic combined systolic (congestive) and diastolic (congestive) heart failure (HCC)    a. EF 30-35% in 2016 b. at 30-35% by echo in 08/2016 c. 06/2019: echo showing EF of 60-65% but found to have severe AS.   [name] Chronic neck pain   . CKD (chronic kidney disease), stage III ([place])   . Complete heart block ([place]) 08/29/2019   a. s/p Medtronic dual chamber pacemeker implantation on 08/30/2019.  [name] Coronary artery disease    a. s/p CABG in 06/2015 with LIMA-LAD, SVG-PDA, SVG-Intermediate  . Diabetes mellitus   . Diabetic neuropathy ([place])   . Hyperkalemia   . Hypertension   . Loculated pleural effusion 09/09/2016  . Pancytopenia ([place]) 08/13/2014  . 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  . Rheumatoid arthritis(714.0)   . Seizure ([place]) 08/13/2014   Pt denies   Past Surgical History:  Procedure Laterality Date  . 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 CARDIAC SURGERY    . FRACTURE SURGERY    . MUSCLE BIOPSY  06/2016  . PACEMAKER IMPLANT N/A 08/30/2019   Procedure: PACEMAKER IMPLANT;  Surgeon: Evans Lance, MD;   Location: [facility];  Service: Cardiovascular;  Laterality: N/A;  . right foot surgery-Toe amputations    . RIGHT/LEFT HEART CATH AND CORONARY/GRAFT ANGIOGRAPHY N/A 09/03/2019   Procedure: RIGHT/LEFT HEART CATH AND CORONARY/GRAFT ANGIOGRAPHY;  Surgeon: Leonie Man, MD;  Location: [facility];  Service: Cardiovascular;  Laterality: N/A;  . TEMPORARY PACEMAKER N/A 08/29/2019   Procedure: TEMPORARY PACEMAKER;  Surgeon: Sherren Mocha, MD;  Location: [facility];  Service: Cardiovascular;  Laterality: N/A;  . TOTAL HIP ARTHROPLASTY Left      A IV Location/Drains/Wounds Patient Lines/Drains/Airways Status   Active Line/Drains/Airways    Name:   Placement date:   Placement time:   Site:   Days:   Peripheral IV 09/11/19 Left Antecubital   09/11/19    2052    Antecubital   less than 1   Pressure Ulcer 08/26/16 Stage II -  Partial thickness loss of dermis presenting as a shallow open ulcer with a red, pink wound bed without slough.   08/26/16    1300     1111   Pressure Ulcer 08/26/16 Unstageable - Full thickness tissue loss in which the base of the ulcer is covered by slough (yellow, tan, gray, green or brown) and/or eschar (tan, brown or black) in the wound bed.   08/26/16    1300  1111   Pressure Injury 09/01/19 Sacrum Posterior Stage II -  Partial thickness loss of dermis presenting as a shallow open ulcer with a red, pink wound bed without slough.   09/01/19    1517     10   Wound / Incision (Open or Dehisced) 10/17/16 Other (Comment) Leg Left   10/17/16    -    Leg   1059          Intake/Output Last 24 hours No intake or output data in the 24 hours ending 09/11/19 2207  Labs/Imaging Results for orders placed or performed during the hospital encounter of 09/11/19 (from the past 48 hour(s))  Basic metabolic panel     Status: Abnormal   Collection Time: 09/11/19  2:02 PM  Result Value Ref Range   Sodium 135 135 - 145 mmol/L   Potassium 4.7 3.5 - 5.1 mmol/L    Chloride 100 98 - 111 mmol/L   CO2 24 22 - 32 mmol/L   Glucose, Bld 243 (H) 70 - 99 mg/dL   BUN 39 (H) 8 - 23 mg/dL   Creatinine, Ser 3.12 (H) 0.61 - 1.24 mg/dL   Calcium 9.2 8.9 - 10.3 mg/dL   GFR calc non Af Amer 19 (L) >60 mL/min   GFR calc Af Amer 22 (L) >60 mL/min   Anion gap 11 5 - 15    Comment: Performed at [facility], [address] [place], [place] [address]  CBC     Status: Abnormal   Collection Time: 09/11/19  2:02 PM  Result Value Ref Range   WBC 8.3 4.0 - 10.5 K/uL   RBC 2.82 (L) 4.22 - 5.81 MIL/uL   Hemoglobin 8.6 (L) 13.0 - 17.0 g/dL   HCT 28.2 (L) 39.0 - 52.0 %   MCV 100.0 80.0 - 100.0 fL   MCH 30.5 26.0 - 34.0 pg   MCHC 30.5 30.0 - 36.0 g/dL   RDW 14.6 11.5 - 15.5 %   Platelets 151 150 - 400 K/uL   nRBC 0.0 0.0 - 0.2 %    Comment: Performed at [facility], [place] [address] [place], [place] [address]  Troponin I (High Sensitivity)     Status: Abnormal   Collection Time: 09/11/19  2:02 PM  Result Value Ref Range   Troponin I (High Sensitivity) 48 (H) <18 ng/L    Comment: (NOTE) Elevated high sensitivity troponin I (hsTnI) values and significant  changes across serial measurements may suggest ACS but many other  chronic and acute conditions are known to elevate hsTnI results.  Refer to the "Links" section for chest pain algorithms and additional  guidance. Performed at [facility], [place] [address] [place], [place] [address]   Troponin I (High Sensitivity)     Status: Abnormal   Collection Time: 09/11/19  8:13 PM  Result Value Ref Range   Troponin I (High Sensitivity) 46 (H) <18 ng/L    Comment: (NOTE) Elevated high sensitivity troponin I (hsTnI) values and significant  changes across serial measurements may suggest ACS but many other  chronic and acute conditions are known to elevate hsTnI results.  Refer to the "Links" section for chest pain algorithms and additional  guidance. Performed at [facility], [place] [address],  [place], [place] [address]   Brain natriuretic peptide     Status: Abnormal   Collection Time: 09/11/19  8:13 PM  Result Value Ref Range   B Natriuretic Peptide 1,766.3 (  H) 0.0 - 100.0 pg/mL    Comment: Performed at [facility], [place] [address] [place], [place] [address]  Magnesium     Status: None   Collection Time: 09/11/19  8:57 PM  Result Value Ref Range   Magnesium 2.3 1.7 - 2.4 mg/dL    Comment: Performed at [facility], [place] [address] [place], [place] [address]  Phosphorus     Status: Abnormal   Collection Time: 09/11/19  8:57 PM  Result Value Ref Range   Phosphorus 5.2 (H) 2.5 - 4.6 mg/dL    Comment: Performed at [facility] [address] [place], [place] [address]   Dg Chest 2 View  Result Date: 09/11/2019 CLINICAL DATA:  Chest pain EXAM: CHEST - 2 VIEW COMPARISON:  08/31/2019, 01/21/2019 FINDINGS: Left-sided implanted cardiac device remains unchanged in positioning. Median sternotomy changes. Heart size remains mildly enlarged. Patchy right lung base opacity is persistent from prior. No pleural effusion or pneumothorax. Advanced degenerative changes of the shoulders. IMPRESSION: Chronic right basilar scarring. Otherwise, no acute cardiopulmonary findings. Electronically Signed   By: Davina Poke M.D.   On: 09/11/2019 14:30    Pending Labs Unresulted Labs (From admission, onward)    Start     Ordered   09/11/19 2151  Urinalysis, Routine w reflex microscopic  ONCE - STAT,   STAT     09/11/19 2150   09/11/19 2151  Sodium, urine, random  ONCE - STAT,   STAT     09/11/19 2150   09/11/19 2151  Creatinine, urine, random  ONCE - STAT,   STAT     09/11/19 2150   09/11/19 2057  SARS CORONAVIRUS 2 (TAT 6-24 HRS) Nasopharyngeal Nasopharyngeal Swab  (Asymptomatic/Tier 2 Patients Labs)  Once,   STAT    Question Answer Comment  Is this test for diagnosis or screening Screening   Symptomatic for COVID-19 as defined by CDC No   Hospitalized for COVID-19 No    Admitted to ICU for COVID-19 No   Previously tested for COVID-19 Yes   Resident in a congregate (group) care setting No   Employed in healthcare setting No      09/11/19 2057          Vitals/Pain Today's Vitals   09/11/19 2055 09/11/19 2100 09/11/19 2124 09/11/19 2130  BP:  (!) 157/82  (!) 163/80  Pulse:  85  81  Resp:  19  13  Temp:      TempSrc:      SpO2:  96%  100%  Weight:      Height:      PainSc: 0-No pain  0-No pain     Isolation Precautions No active isolations  Medications Medications - No data to display  Mobility One assist Low fall risk   Focused Assessments V paced on the monitor, lungs sound diminish sent to hospital by PCP for abnormal BNP. No skin issues.   R Recommendations: See Admitting Provider Note  Report given to:   Additional Notes:

## 2019-09-11 NOTE — H&P (Signed)
History and Physical    [name]:[id number] DOB: 29-Dec-1950 DOA: 09/11/2019  PCP: [name], MD  Patient coming from: Home.  Chief Complaint: Abnormal labs.  HPI: [name] is a 68 y.o. male with history of CAD status post CABG in 2016, chronic combined systolic diastolic CHF with ischemic cardiomyopathy last EF measured in July 2020 was 60 to 55% with recent admission and placement of pacemaker discharge on September 04, 2019 for intermittent complete heart block with chronic kidney disease stage III baseline creatinine about 1.4 I routinely admitted cardiology office and patient's creatinine was found to be increased from baseline the last known last week was 1.6 it was 3.1 and was advised to come to the ER.  Patient takes torsemide but denies taking any NSAIDs but denies nausea vomiting or diarrhea.  ED Course: In the ER repeat labs show creatinine 3.1 UA shows possibility of UTI.  Hemoglobin was 8.6 platelets 151 EKG shows paced rhythm.  Patient clinically appears dehydrated.  Will admit for further observation.  Review of Systems: As per HPI, rest all negative.   Past Medical History:  Diagnosis Date   Aortic stenosis    Chronic back pain    Chronic combined systolic (congestive) and diastolic (congestive) heart failure ([place])    a. EF 30-35% in 2016 b. at 30-35% by echo in 08/2016 c. 06/2019: echo showing EF of 60-65% but found to have severe AS.    Chronic neck pain    CKD (chronic kidney disease), stage III ([place])    Complete heart block ([place]) 08/29/2019   a. s/p Medtronic dual chamber pacemeker implantation on 08/30/2019.   Coronary artery disease    a. s/p CABG in 06/2015 with LIMA-LAD, SVG-PDA, SVG-Intermediate   Diabetes mellitus    Diabetic neuropathy (HCC)    Hyperkalemia    Hypertension    Loculated pleural effusion 09/09/2016   Pancytopenia ([place]) 08/13/2014   Prolonged QT interval 08/14/2014   Possibly secondary to methadone and  amitriptyline.   Rheumatoid arthritis(714.0)    Seizure ([place]) 08/13/2014   Pt denies    Past Surgical History:  Procedure Laterality Date   BELOW KNEE LEG AMPUTATION Left 06/216   left leg   CARDIAC SURGERY     FRACTURE SURGERY     MUSCLE BIOPSY  06/2016   PACEMAKER IMPLANT N/A 08/30/2019   Procedure: PACEMAKER IMPLANT;  Surgeon: Evans Lance, MD;  Location: [facility];  Service: Cardiovascular;  Laterality: N/A;   right foot surgery-Toe amputations     RIGHT/LEFT HEART CATH AND CORONARY/GRAFT ANGIOGRAPHY N/A 09/03/2019   Procedure: RIGHT/LEFT HEART CATH AND CORONARY/GRAFT ANGIOGRAPHY;  Surgeon: Leonie Man, MD;  Location: [facility];  Service: Cardiovascular;  Laterality: N/A;   TEMPORARY PACEMAKER N/A 08/29/2019   Procedure: TEMPORARY PACEMAKER;  Surgeon: Sherren Mocha, MD;  Location: [facility];  Service: Cardiovascular;  Laterality: N/A;   TOTAL HIP ARTHROPLASTY Left      reports that he has never smoked. He has never used smokeless tobacco. He reports that he does not drink alcohol or use drugs.  Allergies  Allergen Reactions   Sulfa Antibiotics Rash    Family History  Problem Relation Age of Onset   Diabetes Father    Dementia Father    Heart attack Brother        multiple brothers with MIs   Hypertension Mother    Stroke Mother     Prior to Admission medications  Medication Sig Start Date End Date Taking? Authorizing Provider  amitriptyline (ELAVIL) 50 MG tablet TAKE 1 AND 1/2 TABLETS BY MOUTH AT BEDTIME. Patient taking differently: Take 75 mg by mouth at bedtime.  05/06/19  Yes [name], MD  aspirin EC 81 MG tablet Take 81 mg by mouth daily.   Yes [provider]  atorvastatin (LIPITOR) 80 MG tablet TAKE ONE TABLET BY MOUTH ONCE DAILY. Patient taking differently: Take 80 mg by mouth daily.  06/03/19  Yes [name], MD  Cholecalciferol (VITAMIN D3) 25 MCG (1000 UT) CHEW Chew 1,000 Units by mouth  daily.    Yes [provider]  clonazePAM (KLONOPIN) 0.25 MG disintegrating tablet DISSOLVE 1 TABLET BY MOUTH DAILY AS NEEDED FOR ANXIETY. Patient taking differently: Take 0.25 mg by mouth daily as needed (for anxiety- dissolve in the mouth).  07/22/19  Yes [name], MD  docusate sodium (COLACE) 100 MG capsule Take 100 mg by mouth 3 (three) times daily.    Yes [provider]  DULoxetine (CYMBALTA) 30 MG capsule Take 30 mg by mouth at bedtime.  10/16/18 10/16/19 Yes [provider]  finasteride (PROSCAR) 5 MG tablet Take 5 mg by mouth daily.  04/24/18  Yes [provider]  hydroxychloroquine (PLAQUENIL) 200 MG tablet Take 200 mg by mouth at bedtime.  04/24/18  Yes [provider]  insulin aspart protamine- aspart (NOVOLOG MIX 70/30) (70-30) 100 UNIT/ML injection Inject 12 units into skin in the morning  and 8 units evening Patient taking differently: Inject 8-12 Units into the skin See admin instructions. Inject 12 units into the skin in the morning and 8 units in the evening 05/06/19  Yes [name], MD  methadone (DOLOPHINE) 10 MG tablet Take 10 mg by mouth every 6 (six) hours.  04/18/18  Yes [provider]  methadone (DOLOPHINE) 5 MG tablet Take 5 mg by mouth 3 (three) times daily as needed (for breakthrough pain).  04/18/18  Yes [provider]  nitroGLYCERIN (NITROSTAT) 0.4 MG SL tablet Place 1 tablet (0.4 mg total) under the tongue every 5 (five) minutes x 3 doses as needed for chest pain. 09/04/19  Yes [facility], Tami Lin, PA  polyethylene glycol powder (MIRALAX) powder Take 17 g by mouth 2 (two) times daily. Patient taking differently: Take 17 g by mouth at bedtime.  03/01/18  Yes [name], MD  Polyvinyl Alcohol-Povidone (REFRESH OP) Place 1 drop into both eyes 3 (three) times daily as needed (for dry eyes).    Yes [provider]  predniSONE (DELTASONE) 5 MG tablet TAKE 1 TABLET BY MOUTH ONCE A DAY WITH  BREAKFAST. Patient taking differently: Take 5 mg by mouth daily with breakfast.  06/03/19  Yes [name], MD  pregabalin (LYRICA) 25 MG capsule TAKE ONE CAPSULE BY MOUTH AT BEDTIME. Patient taking differently: Take 25 mg by mouth at bedtime.  06/03/19  Yes [name], MD  senna (SENOKOT) 8.6 MG TABS tablet Take 1 tablet by mouth 2 (two) times daily.   Yes [provider]  tamsulosin (FLOMAX) 0.4 MG CAPS capsule Take 1 capsule (0.4 mg total) by mouth at bedtime. 05/06/19  Yes [name], MD  torsemide (DEMADEX) 10 MG tablet TAKE 2 TABLETS BY MOUTH EVERY OTHER DAY. Patient taking differently: Take 20 mg by mouth every other day.  05/06/19  Yes [name], MD  blood glucose meter kit and supplies KIT Dispense based on patient and insurance  preference. Use up to 3 times daily as directed. DX:E11.9 08/21/19   [name], MD    Physical Exam: Constitutional: Moderately built and nourished. Vitals:   09/11/19 2130 09/11/19 2200 09/11/19 2215 09/11/19 2230  BP: (!) 163/80 (!) 152/77  138/73  Pulse: 81 80 80 77  Resp: 13 12 (!) 22 20  Temp:      TempSrc:      SpO2: 100% 100% 100% 100%  Weight:      Height:       Eyes: Anicteric no pallor. ENMT: No discharge from the ears eyes nose or mouth. Neck: No JVD appreciated no mass. Respiratory: No rhonchi or crepitations. Cardiovascular: S1-S2 heard. Abdomen: Soft nontender bowel sounds present. Musculoskeletal: No edema.  Left BKA. Skin: Chronic skin changes. Neurologic: Alert awake oriented to time place and person.  Moves all extremities. Psychiatric: Appears normal.   Labs on Admission: I have personally reviewed following labs and imaging studies  CBC: Recent Labs  Lab 09/10/19 1218 09/11/19 1402  WBC 8.0 8.3  HGB 8.7* 8.6*  HCT 25.8* 28.2*  MCV 91 100.0  PLT 158 269   Basic Metabolic Panel: Recent Labs  Lab 09/10/19 1218 09/11/19 1402 09/11/19 2057  NA 140 135  --   K 4.8 4.7  --     CL 99 100  --   CO2 25 24  --   GLUCOSE 131* 243*  --   BUN 43* 39*  --   CREATININE 3.19* 3.12*  --   CALCIUM 9.0 9.2  --   MG  --   --  2.3  PHOS  --   --  5.2*   GFR: Estimated Creatinine Clearance: 22.2 mL/min (A) (by C-G formula based on SCr of 3.12 mg/dL (H)). Liver Function Tests: No results for input(s): AST, ALT, ALKPHOS, BILITOT, PROT, ALBUMIN in the last 168 hours. No results for input(s): LIPASE, AMYLASE in the last 168 hours. No results for input(s): AMMONIA in the last 168 hours. Coagulation Profile: No results for input(s): INR, PROTIME in the last 168 hours. Cardiac Enzymes: No results for input(s): CKTOTAL, CKMB, CKMBINDEX, TROPONINI in the last 168 hours. BNP (last 3 results) Recent Labs    09/10/19 1218  PROBNP 15,761*   HbA1C: No results for input(s): HGBA1C in the last 72 hours. CBG: No results for input(s): GLUCAP in the last 168 hours. Lipid Profile: No results for input(s): CHOL, HDL, LDLCALC, TRIG, CHOLHDL, LDLDIRECT in the last 72 hours. Thyroid Function Tests: No results for input(s): TSH, T4TOTAL, FREET4, T3FREE, THYROIDAB in the last 72 hours. Anemia Panel: No results for input(s): VITAMINB12, FOLATE, FERRITIN, TIBC, IRON, RETICCTPCT in the last 72 hours. Urine analysis:    Component Value Date/Time   COLORURINE YELLOW 07/03/2019 [place] 07/03/2019 0643   LABSPEC 1.010 07/03/2019 0643   PHURINE 7.0 07/03/2019 0643   GLUCOSEU NEGATIVE 07/03/2019 0643   HGBUR SMALL (A) 07/03/2019 0643   BILIRUBINUR NEGATIVE 07/03/2019 0643   KETONESUR NEGATIVE 07/03/2019 0643   PROTEINUR NEGATIVE 07/03/2019 0643   UROBILINOGEN 0.2 08/12/2014 2358   NITRITE NEGATIVE 07/03/2019 0643   LEUKOCYTESUR TRACE (A) 07/03/2019 0643   Sepsis Labs: '@LABRCNTIP' (procalcitonin:4,lacticidven:4) )No results found for this or any previous visit (from the past 240 hour(s)).   Radiological Exams on Admission: Dg Chest 2 View  Result Date:  09/11/2019 CLINICAL DATA:  Chest pain EXAM: CHEST - 2 VIEW COMPARISON:  08/31/2019, 01/21/2019 FINDINGS: Left-sided implanted cardiac device remains unchanged in positioning.  Median sternotomy changes. Heart size remains mildly enlarged. Patchy right lung base opacity is persistent from prior. No pleural effusion or pneumothorax. Advanced degenerative changes of the shoulders. IMPRESSION: Chronic right basilar scarring. Otherwise, no acute cardiopulmonary findings. Electronically Signed   By: Davina Poke M.D.   On: 09/11/2019 14:30    EKG: Independently reviewed.  Paced rhythm.  Assessment/Plan Principal Problem:   ARF (acute renal failure) (HCC) Active Problems:   Rheumatoid arthritis ([place])   Hypertension   Type 2 diabetes mellitus with peripheral vascular disease ([place])    1. Acute on chronic kidney disease stage III -will check FENa.  Renal ultrasound.  Will hold torsemide.  May get renal input from the morning.  Patient has been on dialysis briefly for 3 months previously when patient was in septic shock 3 years ago. 2. CAD status post CABG denies any chest pain.  On aspirin and statin. 3. Recent pacemaker placement for intermittent complete heart block. 4. History of rheumatoid arthritis on steroids and Plaquenil. 5. Diabetes mellitus type 2 on insulin.  Follow CBGs closely to make sure there is no hypoglycemia. 6. Anemia likely from renal disease.  Follow CBC. 7. Severe aortic stenosis being followed by cardiology for possible procedure. 8. Possible UTI will check urine culture on empiric antibiotics for now.   DVT prophylaxis: Lovenox. Code Status: Full code. Family Communication: Patient's wife. Disposition Plan: Home. Consults called: None. Admission status: Observation.   Rise Patience MD [facility] Pager [phone].  If 7PM-7AM, please contact night-coverage [email or web address] Password [facility]  09/11/2019, 10:59 PM

## 2019-09-11 NOTE — ED Notes (Signed)
[place], [place] advised Troponin from 1402, orders to re-draw and orders for BNP

## 2019-09-11 NOTE — ED Triage Notes (Addendum)
Pt sent here by PCP for elevated BNP at PCP office. Pt states that they took blood yesterday and noted that his BNP was 15,000. PT states that they also made a comment about "having a heart attack or something" No troponin visible in chart,  Pt reports mild SOB, denies other cardiac sx.

## 2019-09-11 NOTE — Telephone Encounter (Signed)
Call placed to pt re: lab results from o/v 09/10/2019. Per DOD of day 09/11/2019, Dr. Angelena Form, pt needs to go to the ED due to renal failure / volume overload / increased sob. Pt and wife both have been advised and they stated they would make their way.

## 2019-09-11 NOTE — ED Provider Notes (Signed)
[facility] EMERGENCY DEPARTMENT Provider Note   CSN: [id number] Arrival date & time: 09/11/19  1337     History   Chief Complaint Chief Complaint  Patient presents with  . Abnormal Lab    HPI [name] is a 68 y.o. male with a past medical history of CHF, CKD, complete heart block status post recent pacemaker placement, PVD with left BKA, DM, CAD status post CABG who presents emergency department with abnormal laboratory studies.  Patient reports he saw his cardiologist yesterday and had lab work done at that time.  Patient received a phone call today advising him that his kidney function was worse than normal as well as elevated BNP and advised him to go to the emergency department for further evaluation.  Patient reports he feels in his usual state of health.  Patient reports some mild shortness of breath and a chronic productive cough.  Patient reports his lower extremity swelling has been stable and that he has been compliant with his diuretics.  Patient has felt some fullness in the skin of suprapubic area recently.  Patient denies dysuria or decreased urine output.  Patient denies fever, abdominal pain, diarrhea, constipation, or vomiting.  Patient reports he had renal failure in the past in the setting of sepsis and was on dialysis for a time.       The history is provided by the patient and the spouse.    Past Medical History:  Diagnosis Date  . Aortic stenosis   . Chronic back pain   . Chronic combined systolic (congestive) and diastolic (congestive) heart failure (HCC)    a. EF 30-35% in 2016 b. at 30-35% by echo in 08/2016 c. 06/2019: echo showing EF of 60-65% but found to have severe AS.   [name] Chronic neck pain   . CKD (chronic kidney disease), stage III ([place])   . Complete heart block ([place]) 08/29/2019   a. s/p Medtronic dual chamber pacemeker implantation on 08/30/2019.  [name] Coronary artery disease    a. s/p CABG in 06/2015 with LIMA-LAD, SVG-PDA,  SVG-Intermediate  . Diabetes mellitus   . Diabetic neuropathy ([place])   . Hyperkalemia   . Hypertension   . Loculated pleural effusion 09/09/2016  . Pancytopenia ([place]) 08/13/2014  . 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  . Rheumatoid arthritis(714.0)   . Seizure ([place]) 08/13/2014   Pt denies    Patient Active Problem List   Diagnosis Date Noted  . Type 2 diabetes mellitus with peripheral vascular disease ([place]) 09/11/2019  . Chronic diastolic heart failure ([place]) 09/04/2019  . Pressure injury of skin 09/04/2019  . Pacemaker   . Severe aortic stenosis by prior echocardiogram   . Unstable angina ([place]) 08/29/2019  . Heart block AV third degree ([place])   . Opioid dependence on maintenance agonist therapy, no symptoms ([place]) 07/01/2019  . Elevated troponin 07/01/2019  . Hypotension 07/01/2019  . LBBB (left bundle branch block) 07/01/2019  . Cardiogenic shock ([place]) 07/01/2019  . Current chronic use of systemic steroids 07/01/2019  . Urinary tract infection without hematuria 10/15/2016  . Sepsis ([place]) 10/15/2016  . Loculated pleural effusion 09/09/2016  . Shortness of breath 09/08/2016  . Acute on chronic combined systolic and diastolic CHF (congestive heart failure) ([place]) 09/02/2016  . Acute renal failure superimposed on stage 3 chronic kidney disease ([place]) 09/02/2016  . Hyperkalemia 09/02/2016  . Sacral decubitus ulcer 09/02/2016  . Pressure ulcer 08/27/2016  . Acute hematogenous  osteomyelitis of left foot ([place]) 08/09/2016  . Cellulitis and abscess of lower extremity 05/29/2016  . Anemia of chronic disease 04/27/2016  . Status post amputation of toe of left foot ([place]) 04/27/2016  . Systolic heart failure ([place]) 07/09/2015  . Bradycardia 08/14/2014  . Prolonged QT interval 08/14/2014  . Gait disorder 08/14/2014  . Seizure ([place]) 08/13/2014  . Pancytopenia ([place]) 08/13/2014  . ARF (acute renal failure) ([place]) 08/13/2014  . Altered mental  status 06/18/2014  . Closed dislocation of metatarsophalangeal (joint) 03/26/2013  . Insulin dependent diabetes mellitus ([place]) 12/11/2012  . Hyperlipidemia 12/11/2012  . Coronary artery disease   . Rheumatoid arthritis ([place])   . Hypertension     Past Surgical History:  Procedure Laterality Date  . 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 CARDIAC SURGERY    . FRACTURE SURGERY    . MUSCLE BIOPSY  06/2016  . PACEMAKER IMPLANT N/A 08/30/2019   Procedure: PACEMAKER IMPLANT;  Surgeon: Evans Lance, MD;  Location: [facility];  Service: Cardiovascular;  Laterality: N/A;  . right foot surgery-Toe amputations    . RIGHT/LEFT HEART CATH AND CORONARY/GRAFT ANGIOGRAPHY N/A 09/03/2019   Procedure: RIGHT/LEFT HEART CATH AND CORONARY/GRAFT ANGIOGRAPHY;  Surgeon: Leonie Man, MD;  Location: [facility];  Service: Cardiovascular;  Laterality: N/A;  . TEMPORARY PACEMAKER N/A 08/29/2019   Procedure: TEMPORARY PACEMAKER;  Surgeon: Sherren Mocha, MD;  Location: [facility];  Service: Cardiovascular;  Laterality: N/A;  . TOTAL HIP ARTHROPLASTY Left         Home Medications    Prior to Admission medications   Medication Sig Start Date End Date Taking? Authorizing Provider  amitriptyline (ELAVIL) 50 MG tablet TAKE 1 AND 1/2 TABLETS BY MOUTH AT BEDTIME. Patient taking differently: Take 75 mg by mouth at bedtime.  05/06/19  Yes [name], MD  aspirin EC 81 MG tablet Take 81 mg by mouth daily.   Yes [provider]  atorvastatin (LIPITOR) 80 MG tablet TAKE ONE TABLET BY MOUTH ONCE DAILY. Patient taking differently: Take 80 mg by mouth daily.  06/03/19  Yes [name], MD  Cholecalciferol (VITAMIN D3) 25 MCG (1000 UT) CHEW Chew 1,000 Units by mouth daily.    Yes [provider]  clonazePAM (KLONOPIN) 0.25 MG disintegrating tablet DISSOLVE 1 TABLET BY MOUTH DAILY AS NEEDED FOR ANXIETY. Patient taking differently: Take 0.25 mg by mouth daily  as needed (for anxiety- dissolve in the mouth).  07/22/19  Yes [name], MD  docusate sodium (COLACE) 100 MG capsule Take 100 mg by mouth 3 (three) times daily.    Yes [provider]  DULoxetine (CYMBALTA) 30 MG capsule Take 30 mg by mouth at bedtime.  10/16/18 10/16/19 Yes [provider]  finasteride (PROSCAR) 5 MG tablet Take 5 mg by mouth daily.  04/24/18  Yes [provider]  hydroxychloroquine (PLAQUENIL) 200 MG tablet Take 200 mg by mouth at bedtime.  04/24/18  Yes [provider]  insulin aspart protamine- aspart (NOVOLOG MIX 70/30) (70-30) 100 UNIT/ML injection Inject 12 units into skin in the morning  and 8 units evening Patient taking differently: Inject 8-12 Units into the skin See admin instructions. Inject 12 units into the skin in the morning and 8 units in the evening 05/06/19  Yes [name], MD  methadone (DOLOPHINE) 10 MG tablet Take 10 mg by mouth every 6 (six) hours.  04/18/18  Yes [provider]  methadone (DOLOPHINE) 5 MG tablet Take 5 mg by mouth 3 (three) times daily as needed (for breakthrough pain).  04/18/18  Yes [provider]  nitroGLYCERIN (NITROSTAT) 0.4 MG SL tablet Place 1 tablet (0.4 mg total) under the tongue every 5 (five) minutes x 3 doses as needed for chest pain. 09/04/19  Yes Duke, Tami Lin, PA  polyethylene glycol powder (MIRALAX) powder Take 17 g by mouth 2 (two) times daily. Patient taking differently: Take 17 g by mouth at bedtime.  03/01/18  Yes [name], MD  Polyvinyl Alcohol-Povidone (REFRESH OP) Place 1 drop into both eyes 3 (three) times daily as needed (for dry eyes).    Yes [provider]  predniSONE (DELTASONE) 5 MG tablet TAKE 1 TABLET BY MOUTH ONCE A DAY WITH BREAKFAST. Patient taking differently: Take 5 mg by mouth daily with breakfast.  06/03/19  Yes [name], MD  pregabalin (LYRICA) 25 MG capsule TAKE ONE CAPSULE BY MOUTH AT BEDTIME. Patient taking  differently: Take 25 mg by mouth at bedtime.  06/03/19  Yes [name], MD  senna (SENOKOT) 8.6 MG TABS tablet Take 1 tablet by mouth 2 (two) times daily.   Yes [provider]  tamsulosin (FLOMAX) 0.4 MG CAPS capsule Take 1 capsule (0.4 mg total) by mouth at bedtime. 05/06/19  Yes [name], MD  torsemide (DEMADEX) 10 MG tablet TAKE 2 TABLETS BY MOUTH EVERY OTHER DAY. Patient taking differently: Take 20 mg by mouth every other day.  05/06/19  Yes [name], MD  blood glucose meter kit and supplies KIT Dispense based on patient and insurance preference. Use up to 3 times daily as directed. DX:E11.9 08/21/19   [name], MD    Family History Family History  Problem Relation Age of Onset  . Diabetes Father   . Dementia Father   . Heart attack Brother        multiple brothers with MIs  . Hypertension Mother   . Stroke Mother     Social History Social History   Tobacco Use  . Smoking status: Never Smoker  . Smokeless tobacco: Never Used  Substance Use Topics  . Alcohol use: No  . Drug use: No     Allergies   Sulfa antibiotics   Review of Systems Review of Systems  Constitutional: Positive for fatigue. Negative for diaphoresis and fever.  HENT: Negative for congestion and trouble swallowing.   Eyes: Negative for visual disturbance.  Respiratory: Positive for cough (chronic) and shortness of breath (mild).   Cardiovascular: Positive for leg swelling (mild). Negative for chest pain and palpitations.  Gastrointestinal: Negative for abdominal pain, constipation, diarrhea, nausea and vomiting.  Genitourinary: Negative for decreased urine volume, difficulty urinating, dysuria and hematuria.  Neurological: Negative for syncope, weakness and headaches.  Psychiatric/Behavioral: Negative for confusion.     Physical Exam Updated Vital Signs BP (!) 152/86   Pulse 80   Temp 98.3 F (36.8 C) (Oral)   Resp 19   Ht '5\' 7"'  (1.702 m)   Wt 79.4 kg    SpO2 100%   BMI 27.41 kg/m   Physical Exam Constitutional:      General: He is not in acute distress.    Appearance: Normal appearance. He is not diaphoretic.  HENT:     Head: Normocephalic and atraumatic.     Right Ear: External ear normal.     Left Ear: External ear normal.     Nose:  Nose normal.     Mouth/Throat:     Mouth: Mucous membranes are moist.     Pharynx: Oropharynx is clear.  Eyes:     Conjunctiva/sclera: Conjunctivae normal.  Neck:     Musculoskeletal: Neck supple.  Cardiovascular:     Rate and Rhythm: Normal rate and regular rhythm.     Heart sounds: Murmur present.     Comments: Healed mid-sternal surgical scar Pulmonary:     Effort: No respiratory distress.     Breath sounds: No wheezing or rhonchi.     Comments: Decreased air movement throughout, cardiac device in L upper chest Chest:     Chest wall: No tenderness.  Abdominal:     Palpations: Abdomen is soft.     Tenderness: There is no abdominal tenderness. There is no guarding.     Comments: Some non-pitting edema of lower abdomen/suprapubic  Musculoskeletal:     Right lower leg: Edema (trace at foot) present.     Comments: L BKA  Skin:    General: Skin is warm and dry.  Neurological:     General: No focal deficit present.     Mental Status: He is alert and oriented to person, place, and time.     Cranial Nerves: No cranial nerve deficit.     Motor: No weakness.      ED Treatments / Results  Labs (all labs ordered are listed, but only abnormal results are displayed) Labs Reviewed  BASIC METABOLIC PANEL - Abnormal; Notable for the following components:      Result Value   Glucose, Bld 243 (*)    BUN 39 (*)    Creatinine, Ser 3.12 (*)    GFR calc non Af Amer 19 (*)    GFR calc Af Amer 22 (*)    All other components within normal limits  CBC - Abnormal; Notable for the following components:   RBC 2.82 (*)    Hemoglobin 8.6 (*)    HCT 28.2 (*)    All other components within normal limits   BRAIN NATRIURETIC PEPTIDE - Abnormal; Notable for the following components:   B Natriuretic Peptide 1,766.3 (*)    All other components within normal limits  PHOSPHORUS - Abnormal; Notable for the following components:   Phosphorus 5.2 (*)    All other components within normal limits  URINALYSIS, ROUTINE W REFLEX MICROSCOPIC - Abnormal; Notable for the following components:   Glucose, UA 50 (*)    Leukocytes,Ua MODERATE (*)    WBC, UA >50 (*)    Bacteria, UA RARE (*)    All other components within normal limits  TROPONIN I (HIGH SENSITIVITY) - Abnormal; Notable for the following components:   Troponin I (High Sensitivity) 48 (*)    All other components within normal limits  TROPONIN I (HIGH SENSITIVITY) - Abnormal; Notable for the following components:   Troponin I (High Sensitivity) 46 (*)    All other components within normal limits  SARS CORONAVIRUS 2 (TAT 6-24 HRS)  MAGNESIUM  SODIUM, URINE, RANDOM  CREATININE, URINE, RANDOM  CBC  CBC  CREATININE, SERUM    EKG Ventricularly paced rhythm  Radiology Dg Chest 2 View  Result Date: 09/11/2019 CLINICAL DATA:  Chest pain EXAM: CHEST - 2 VIEW COMPARISON:  08/31/2019, 01/21/2019 FINDINGS: Left-sided implanted cardiac device remains unchanged in positioning. Median sternotomy changes. Heart size remains mildly enlarged. Patchy right lung base opacity is persistent from prior. No pleural effusion or pneumothorax. Advanced degenerative changes of the shoulders.  IMPRESSION: Chronic right basilar scarring. Otherwise, no acute cardiopulmonary findings. Electronically Signed   By: Davina Poke M.D.   On: 09/11/2019 14:30   US Renal  Result Date: 09/11/2019 CLINICAL DATA:  Chronic kidney disease EXAM: RENAL / URINARY TRACT ULTRASOUND COMPLETE COMPARISON:  07/01/2019, CT 10/15/2016 FINDINGS: Right Kidney: Renal measurements: 10 x 5.4 x 5.5 cm = volume: 156.8 mL. Cortical echogenicity within normal limits. No hydronephrosis. Shadowing  calcification at the lower pole. Mild renal cortical thinning. Left Kidney: Renal measurements: 10 x 5 x 4.7 cm = volume: 124.6 mL. Cortical echogenicity within normal limits. No hydronephrosis. Shadowing calcification at the midpole. Mild cortical thinning. Bladder: Appears normal for degree of bladder distention. IMPRESSION: 1. Negative for hydronephrosis. 2. Shadowing calcifications within the kidneys, uncertain if this is due to vascular calcification or kidney stones. Electronically Signed   By: Donavan Foil M.D.   On: 09/11/2019 23:52    Procedures Procedures (including critical care time)  Medications Ordered in ED Medications  aspirin EC tablet 81 mg (has no administration in time range)  methadone (DOLOPHINE) tablet 10 mg (has no administration in time range)  hydroxychloroquine (PLAQUENIL) tablet 200 mg (has no administration in time range)  atorvastatin (LIPITOR) tablet 80 mg (has no administration in time range)  nitroGLYCERIN (NITROSTAT) SL tablet 0.4 mg (has no administration in time range)  amitriptyline (ELAVIL) tablet 75 mg (has no administration in time range)  DULoxetine (CYMBALTA) DR capsule 30 mg (has no administration in time range)  insulin aspart protamine- aspart (NOVOLOG MIX 70/30) injection 8-12 Units (has no administration in time range)  predniSONE (DELTASONE) tablet 5 mg (has no administration in time range)  docusate sodium (COLACE) capsule 100 mg (has no administration in time range)  polyethylene glycol (MIRALAX / GLYCOLAX) packet 17 g (has no administration in time range)  finasteride (PROSCAR) tablet 5 mg (has no administration in time range)  tamsulosin (FLOMAX) capsule 0.4 mg (has no administration in time range)  clonazePAM (KLONOPIN) disintegrating tablet 0.25 mg (has no administration in time range)  pregabalin (LYRICA) capsule 25 mg (has no administration in time range)  acetaminophen (TYLENOL) tablet 650 mg (has no administration in time range)    Or   acetaminophen (TYLENOL) suppository 650 mg (has no administration in time range)  insulin aspart (novoLOG) injection 0-9 Units (has no administration in time range)  enoxaparin (LOVENOX) injection 30 mg (has no administration in time range)     Initial Impression / Assessment and Plan / ED Course  I have reviewed the triage vital signs and the nursing notes.  Pertinent labs & imaging results that were available during my care of the patient were reviewed by me and considered in my medical decision making (see chart for details).        Concern for AKI on CKD which may be contributing to patient's fluid overload status.  Potassium is not elevated.  Patient does not have any increased work of breathing on room air.  Patient denies chest pain.  Patient's new pacemaker was interrogated yesterday and was working properly at that time.  Patient admitted to hospital service for further evaluation and management of his AKI and fluid status.  Patient seen and plan discussed with Dr. Lita Mains.  Final Clinical Impressions(s) / ED Diagnoses   Final diagnoses:  ARF (acute renal failure) [facility])    ED Discharge Orders    None       Betsey Amen, MD 09/12/19 0133    Julianne Rice,  MD 09/12/19 1305

## 2019-09-12 ENCOUNTER — Encounter (HOSPITAL_COMMUNITY): Payer: Self-pay | Admitting: General Practice

## 2019-09-12 DIAGNOSIS — M05712 Rheumatoid arthritis with rheumatoid factor of left shoulder without organ or systems involvement: Secondary | ICD-10-CM | POA: Diagnosis not present

## 2019-09-12 DIAGNOSIS — N179 Acute kidney failure, unspecified: Secondary | ICD-10-CM | POA: Diagnosis not present

## 2019-09-12 DIAGNOSIS — I10 Essential (primary) hypertension: Secondary | ICD-10-CM | POA: Diagnosis not present

## 2019-09-12 DIAGNOSIS — M05711 Rheumatoid arthritis with rheumatoid factor of right shoulder without organ or systems involvement: Secondary | ICD-10-CM

## 2019-09-12 DIAGNOSIS — N3000 Acute cystitis without hematuria: Secondary | ICD-10-CM | POA: Diagnosis not present

## 2019-09-12 DIAGNOSIS — E1122 Type 2 diabetes mellitus with diabetic chronic kidney disease: Secondary | ICD-10-CM | POA: Diagnosis not present

## 2019-09-12 DIAGNOSIS — I5030 Unspecified diastolic (congestive) heart failure: Secondary | ICD-10-CM | POA: Diagnosis not present

## 2019-09-12 DIAGNOSIS — N183 Chronic kidney disease, stage 3 (moderate): Secondary | ICD-10-CM | POA: Diagnosis not present

## 2019-09-12 DIAGNOSIS — E1151 Type 2 diabetes mellitus with diabetic peripheral angiopathy without gangrene: Secondary | ICD-10-CM | POA: Diagnosis not present

## 2019-09-12 DIAGNOSIS — I130 Hypertensive heart and chronic kidney disease with heart failure and stage 1 through stage 4 chronic kidney disease, or unspecified chronic kidney disease: Secondary | ICD-10-CM | POA: Diagnosis not present

## 2019-09-12 LAB — CBC
HCT: 26.5 % — ABNORMAL LOW (ref 39.0–52.0)
Hemoglobin: 8.6 g/dL — ABNORMAL LOW (ref 13.0–17.0)
MCH: 31.5 pg (ref 26.0–34.0)
MCHC: 32.5 g/dL (ref 30.0–36.0)
MCV: 97.1 fL (ref 80.0–100.0)
Platelets: 141 10*3/uL — ABNORMAL LOW (ref 150–400)
RBC: 2.73 MIL/uL — ABNORMAL LOW (ref 4.22–5.81)
RDW: 14.6 % (ref 11.5–15.5)
WBC: 6.3 10*3/uL (ref 4.0–10.5)
nRBC: 0 % (ref 0.0–0.2)

## 2019-09-12 LAB — BASIC METABOLIC PANEL
Anion gap: 10 (ref 5–15)
BUN: 36 mg/dL — ABNORMAL HIGH (ref 8–23)
CO2: 27 mmol/L (ref 22–32)
Calcium: 8.8 mg/dL — ABNORMAL LOW (ref 8.9–10.3)
Chloride: 101 mmol/L (ref 98–111)
Creatinine, Ser: 2.8 mg/dL — ABNORMAL HIGH (ref 0.61–1.24)
GFR calc Af Amer: 25 mL/min — ABNORMAL LOW (ref >60)
GFR calc non Af Amer: 22 mL/min — ABNORMAL LOW (ref >60)
Glucose, Bld: 92 mg/dL (ref 70–99)
Potassium: 4.1 mmol/L (ref 3.5–5.1)
Sodium: 138 mmol/L (ref 135–145)

## 2019-09-12 LAB — URINALYSIS, ROUTINE W REFLEX MICROSCOPIC
Bilirubin Urine: NEGATIVE
Glucose, UA: 50 mg/dL — AB
Hgb urine dipstick: NEGATIVE
Ketones, ur: NEGATIVE mg/dL
Nitrite: NEGATIVE
Protein, ur: NEGATIVE mg/dL
Specific Gravity, Urine: 1.01 (ref 1.005–1.030)
WBC, UA: >50 WBC/hpf — ABNORMAL HIGH (ref 0–5)
pH: 6 (ref 5.0–8.0)

## 2019-09-12 LAB — GLUCOSE, CAPILLARY
Glucose-Capillary: 123 mg/dL — ABNORMAL HIGH (ref 70–99)
Glucose-Capillary: 86 mg/dL (ref 70–99)
Glucose-Capillary: 199 mg/dL — ABNORMAL HIGH (ref 70–99)
Glucose-Capillary: 153 mg/dL — ABNORMAL HIGH (ref 70–99)

## 2019-09-12 LAB — SARS CORONAVIRUS 2 (TAT 6-24 HRS): SARS Coronavirus 2: NEGATIVE

## 2019-09-12 LAB — CREATININE, SERUM
Creatinine, Ser: 2.93 mg/dL — ABNORMAL HIGH (ref 0.61–1.24)
GFR calc Af Amer: 24 mL/min — ABNORMAL LOW (ref >60)
GFR calc non Af Amer: 21 mL/min — ABNORMAL LOW (ref >60)

## 2019-09-12 LAB — CREATININE, URINE, RANDOM: Creatinine, Urine: 43.88 mg/dL

## 2019-09-12 LAB — SODIUM, URINE, RANDOM: Sodium, Ur: 70 mmol/L

## 2019-09-12 MED ORDER — INSULIN ASPART PROT & ASPART (70-30 MIX) 100 UNIT/ML ~~LOC~~ SUSP
8.0000 [IU] | Freq: Every day | SUBCUTANEOUS | Status: DC
  Administered 2019-09-12 – 2019-09-14 (×3): 8 [IU] via SUBCUTANEOUS

## 2019-09-12 MED ORDER — SODIUM CHLORIDE 0.9 % IV SOLN: INTRAVENOUS | Status: DC | PRN

## 2019-09-12 MED ORDER — SODIUM CHLORIDE 0.9 % IV SOLN
1.0000 g | INTRAVENOUS | Status: DC
  Administered 2019-09-13: 1 g via INTRAVENOUS
  Filled 2019-09-12: qty 10

## 2019-09-12 MED ORDER — AMITRIPTYLINE HCL 50 MG PO TABS
75.0000 mg | ORAL_TABLET | Freq: Every day | ORAL | Status: DC
  Filled 2019-09-12 (×2): qty 1

## 2019-09-12 MED ORDER — SODIUM CHLORIDE 0.9 % IV SOLN
1.0000 g | Freq: Once | INTRAVENOUS | Status: AC
  Administered 2019-09-12: 1 g via INTRAVENOUS
  Filled 2019-09-12: qty 10

## 2019-09-12 NOTE — ED Notes (Signed)
Tele   Breakfast ordered  

## 2019-09-12 NOTE — Progress Notes (Addendum)
PROGRESS NOTE  [name] LOV:[id number] DOB: January 05, 1951 DOA: 09/11/2019 PCP: [name], MD  HPI/Recap of past 24 hours: HPI from Dr [name] is a 68 y.o. 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  Went for a routine cardiology appointment, and patient's creatinine was found to be increased from baseline, the last known last week was 1.6 it was 3.1 and was advised to come to the ER.  Patient takes torsemide but denies taking any NSAIDs, denies nausea vomiting or diarrhea.  In the ED, repeat creatinine was 3.1, UA shows possibility of UTI.  Hemoglobin was 8.6 platelets 151 EKG shows paced rhythm.  Patient clinically appeared dehydrated.  Patient admitted for further management.    Today, patient denies any new complaints, denies any chest pain, shortness of breath, abdominal pain, dizziness, fever/chills, nausea/vomiting, diarrhea.  Wife at bedside.  Assessment/Plan: Principal Problem:   ARF (acute renal failure) (HCC) Active Problems:   Rheumatoid arthritis ([place])   Hypertension   Type 2 diabetes mellitus with peripheral vascular disease (HCC)   Acute on chronic kidney disease stage III Last creatinine was 1.67, on admission 3.19-->2.8 Patient on torsemide at home Renal ultrasound showed negative for hydronephrosis, shadowing calcifications within the kidneys Hold home torsemide for now Nephrology consulted for further management Daily BMP  UTI Patient complains of dysuria UA with moderate leukocytes, greater than 50 WBC UC pending Continue IV Rocephin for now  Diabetes mellitus type 2 SSI, home 70/30, Accu-Cheks, hypoglycemic protocol  Anemia of  chronic kidney disease Hemoglobin at baseline Daily CBC  CAD status post CABG/severe aortic stenosis Currently chest pain-free Continue aspirin, statin Patient planned for possible TAVR  Intermittent complete heart block status post pacemaker placement Stable  History of rheumatoid arthritis Continue steroids, Plaquenil            Malnutrition Type:      Malnutrition Characteristics:      Nutrition Interventions:       Estimated body mass index is 24.44 kg/m as calculated from the following:   Height as of this encounter: 5\' 8"  (1.727 m).   Weight as of this encounter: 72.9 kg.     Code Status: Full  Family Communication: Wife at bedside  Disposition Plan: To be determined, likely home   Consultants:  Nephrology  Procedures:  None  Antimicrobials:  Ceftriaxone  DVT prophylaxis: Lovenox   Objective: Vitals:   09/12/19 0400 09/12/19 0445 09/12/19 0640 09/12/19 1301  BP: 140/73 129/61 91/78 (!) 124/49  Pulse: (!) 114 81 85 66  Resp: 16 16 18 18   Temp:   99 F (37.2 C) 98.6 F (37 C)  TempSrc:   Oral Oral  SpO2: (!) 86% 100% 98% 91%  Weight:   72.9 kg   Height:   5\' 8"  (1.727 m)     Intake/Output Summary (Last 24 hours) at 09/12/2019 1527 Last data filed at 09/12/2019 1100 Gross per 24 hour  Intake 240 ml  Output 700 ml  Net -460 ml   Filed Weights   09/11/19 1352 09/12/19 0640  Weight: 79.4 kg 72.9 kg    Exam:  General: NAD   Cardiovascular: S1, S2 present  Respiratory: CTAB  Abdomen: Soft, nontender,  nondistended, bowel sounds present  Musculoskeletal: No pedal edema noted, left BKA  Skin:  Chronic skin changes  Psychiatry: Normal mood   Data Reviewed: CBC: Recent Labs  Lab 09/10/19 1218 09/11/19 1402 09/12/19 0228  WBC 8.0 8.3 6.3  HGB 8.7* 8.6* 8.6*  HCT 25.8* 28.2* 26.5*  MCV 91 100.0 97.1  PLT 158 151 979*   Basic Metabolic Panel: Recent Labs  Lab 09/10/19 1218 09/11/19 1402 09/11/19 2057  09/12/19 0944 09/12/19 1255  NA 140 135  --   --  138  K 4.8 4.7  --   --  4.1  CL 99 100  --   --  101  CO2 25 24  --   --  27  GLUCOSE 131* 243*  --   --  92  BUN 43* 39*  --   --  36*  CREATININE 3.19* 3.12*  --  2.93* 2.80*  CALCIUM 9.0 9.2  --   --  8.8*  MG  --   --  2.3  --   --   PHOS  --   --  5.2*  --   --    GFR: Estimated Creatinine Clearance: 23.8 mL/min (A) (by C-G formula based on SCr of 2.8 mg/dL (H)). Liver Function Tests: No results for input(s): AST, ALT, ALKPHOS, BILITOT, PROT, ALBUMIN in the last 168 hours. No results for input(s): LIPASE, AMYLASE in the last 168 hours. No results for input(s): AMMONIA in the last 168 hours. Coagulation Profile: No results for input(s): INR, PROTIME in the last 168 hours. Cardiac Enzymes: No results for input(s): CKTOTAL, CKMB, CKMBINDEX, TROPONINI in the last 168 hours. BNP (last 3 results) Recent Labs    09/10/19 1218  PROBNP 15,761*   HbA1C: No results for input(s): HGBA1C in the last 72 hours. CBG: Recent Labs  Lab 09/12/19 0652 09/12/19 1232  GLUCAP 123* 86   Lipid Profile: No results for input(s): CHOL, HDL, LDLCALC, TRIG, CHOLHDL, LDLDIRECT in the last 72 hours. Thyroid Function Tests: No results for input(s): TSH, T4TOTAL, FREET4, T3FREE, THYROIDAB in the last 72 hours. Anemia Panel: No results for input(s): VITAMINB12, FOLATE, FERRITIN, TIBC, IRON, RETICCTPCT in the last 72 hours. Urine analysis:    Component Value Date/Time   COLORURINE YELLOW 09/11/2019 [place] 09/11/2019 2315   LABSPEC 1.010 09/11/2019 2315   PHURINE 6.0 09/11/2019 2315   GLUCOSEU 50 (A) 09/11/2019 2315   HGBUR NEGATIVE 09/11/2019 2315   BILIRUBINUR NEGATIVE 09/11/2019 2315   KETONESUR NEGATIVE 09/11/2019 2315   PROTEINUR NEGATIVE 09/11/2019 2315   UROBILINOGEN 0.2 08/12/2014 2358   NITRITE NEGATIVE 09/11/2019 2315   LEUKOCYTESUR MODERATE (A) 09/11/2019 2315   Sepsis Labs:  @[email or web address] (procalcitonin:4,lacticidven:4)  ) Recent Results (from the past 240 hour(s))  SARS CORONAVIRUS 2 (TAT 6-24 HRS) Nasopharyngeal Nasopharyngeal Swab     Status: None   Collection Time: 09/11/19  9:18 PM   Specimen: Nasopharyngeal Swab  Result Value Ref Range Status   SARS Coronavirus 2 NEGATIVE NEGATIVE Final    Comment: (NOTE) SARS-CoV-2 target nucleic acids are NOT DETECTED. The SARS-CoV-2 RNA is generally detectable in upper and lower respiratory specimens during the acute phase of infection. Negative results do not preclude SARS-CoV-2 infection, do not rule out co-infections with other pathogens, and should not be used as the sole basis for treatment or other patient management decisions. Negative results must be combined with clinical observations, patient history, and epidemiological information. The expected result is Negative. Fact Sheet for Patients:  SugarRoll.be Fact Sheet for Healthcare Providers: https://www.woods-mathews.com/ This test is not yet approved or cleared by the [place] FDA and  has been authorized for detection and/or diagnosis of SARS-CoV-2 by FDA under an Emergency Use Authorization (EUA). This EUA will remain  in effect (meaning this test can be used) for the duration of the COVID-19 declaration under Section 56 4(b)(1) of the Act, 21 U.S.C. section 360bbb-3(b)(1), unless the authorization is terminated or revoked sooner. Performed at [facility], [place] [address], [place], [place] [address]       Studies: US Renal  Result Date: 09/11/2019 CLINICAL DATA:  Chronic kidney disease EXAM: RENAL / URINARY TRACT ULTRASOUND COMPLETE COMPARISON:  07/01/2019, CT 10/15/2016 FINDINGS: Right Kidney: Renal measurements: 10 x 5.4 x 5.5 cm = volume: 156.8 mL. Cortical echogenicity within normal limits. No hydronephrosis. Shadowing calcification at the lower pole. Mild renal cortical thinning. Left Kidney:  Renal measurements: 10 x 5 x 4.7 cm = volume: 124.6 mL. Cortical echogenicity within normal limits. No hydronephrosis. Shadowing calcification at the midpole. Mild cortical thinning. Bladder: Appears normal for degree of bladder distention. IMPRESSION: 1. Negative for hydronephrosis. 2. Shadowing calcifications within the kidneys, uncertain if this is due to vascular calcification or kidney stones. Electronically Signed   By: Donavan Foil M.D.   On: 09/11/2019 23:52    Scheduled Meds: . amitriptyline  75 mg Oral QHS  . aspirin EC  81 mg Oral Daily  . atorvastatin  80 mg Oral Daily  . docusate sodium  100 mg Oral TID  . DULoxetine  30 mg Oral QHS  . enoxaparin (LOVENOX) injection  30 mg Subcutaneous Q24H  . finasteride  5 mg Oral Daily  . hydroxychloroquine  200 mg Oral QHS  . insulin aspart  0-9 Units Subcutaneous TID WC  . insulin aspart protamine- aspart  12 Units Subcutaneous Q breakfast  . insulin aspart protamine- aspart  8 Units Subcutaneous Q supper  . methadone  10 mg Oral Q6H  . polyethylene glycol  17 g Oral QHS  . predniSONE  5 mg Oral Q breakfast  . pregabalin  25 mg Oral QHS  . tamsulosin  0.4 mg Oral QHS    Continuous Infusions: . sodium chloride    . [START ON 09/13/2019] cefTRIAXone (ROCEPHIN)  IV       LOS: 0 days     Alma Friendly, MD [facility]  If 7PM-7AM, please contact night-coverage [email or web address] 09/12/2019, 3:27 PM

## 2019-09-12 NOTE — Progress Notes (Signed)
Patient stated that he had had an indwelling foley for 2 years and it was removed 2 months ago.

## 2019-09-12 NOTE — ED Notes (Signed)
Pt upset about getting move to another zone on the ED to be holding for a bed upstairs, states we need to wait that he eats first, pt oriented that we have a EMS pt coming to that room and for that reason we'll have to move him on this moment, but he will have another great team taking care of him and making him comfortable, family member and pt still upset about the change.

## 2019-09-12 NOTE — Plan of Care (Signed)
New care plan

## 2019-09-12 NOTE — Consult Note (Signed)
[name] Admit Date: 09/11/2019 09/12/2019 [name] Requesting Physician:  [name]  Reason for Consult:  [id number] HPI:  68 year old male admitted on 9/16 after identification of acute renal failure.    PMH Incudes:  CAD with history of CABG  Ischemic cardiomyopathy with chronic systolic heart failure  CKD 3 baseline creatinine around 1.5, followed by [facility] nephrology  Severe aortic stenosis  PAD with history of left BKA  Hypertension  Diabetes type 2  Rheumatoid arthritis on hydroxychloroquine and prednisone  Patient with recent complicated medical course.  He had chest pain when visiting with [name] to discuss potential TAVR for aortic stenosis and was admitted, found to have symptomatic bradycardia requiring placement of permanent pacemaker.  He subsequently underwent left heart catheterization on 9/8.  Contrast exposure was 70 mL's.  No PCI was performed.  The day after left heart catheterization his creatinine was unchanged at 1.67.  He had follow-up on 9/15 at which point his creatinine increased to 3.19.  He thereafter presented to the emergency room her creatinine was 3.12 and has improved to 2.0 here today.  Thankfully, no hyperkalemia or significant acidosis.  He had a renal ultrasound upon presentation demonstrating bilateral symmetrical kidneys of 10 cm each without hydronephrosis or other acute structural findings.  He has not used any nonsteroidals.  He takes torsemide 20 mg every other day.  No lower extremity edema, lower urinary tract symptoms, he denies any rashes.  Patient does have pyuria on urine analysis.  Urine culture pending.  No significant hematuria or proteinuria.    Creatinine, Ser (mg/dL)  Date Value  09/12/2019 2.80 (H)  09/12/2019 2.93 (H)  09/11/2019 3.12 (H)  09/10/2019 3.19 (H)  09/04/2019 1.67 (H)  09/03/2019 1.76 (H)  09/02/2019 1.55 (H)  09/01/2019 1.44 (H)  08/31/2019 1.60 (H)  08/30/2019 2.32 (H)   ] I/Os:  ROS IV Contrast exposure during left heart catheterization 9/8 TMP/SMX no exposure Hypotension not present Balance of 12 systems is negative w/ exceptions as above  PMH  Past Medical History:  Diagnosis Date  . Aortic stenosis   . ARF (acute renal failure) ([place]) 09/11/2019  . Chronic back pain   . Chronic combined systolic (congestive) and diastolic (congestive) heart failure (HCC)    a. EF 30-35% in 2016 b. at 30-35% by echo in 08/2016 c. 06/2019: echo showing EF of 60-65% but found to have severe AS.   [name] Chronic neck pain   . CKD (chronic kidney disease), stage III ([place])   . Complete heart block ([place]) 08/29/2019   a. s/p Medtronic dual chamber pacemeker implantation on 08/30/2019.  [name] Coronary artery disease    a. s/p CABG in 06/2015 with LIMA-LAD, SVG-PDA, SVG-Intermediate  . Diabetes mellitus   . Diabetic neuropathy ([place])   . Hyperkalemia   . Hypertension   . Loculated pleural effusion 09/09/2016  . Pancytopenia ([place]) 08/13/2014  . 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  . Rheumatoid arthritis(714.0)   . Seizure ([place]) 08/13/2014   Pt denies   [place]  Past Surgical History:  Procedure Laterality Date  . 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 CARDIAC SURGERY    . FRACTURE SURGERY    . MUSCLE BIOPSY  06/2016  . PACEMAKER IMPLANT N/A 08/30/2019   Procedure: PACEMAKER IMPLANT;  Surgeon: Evans Lance, MD;  Location: [facility];  Service: Cardiovascular;  Laterality: N/A;  . right foot surgery-Toe amputations    .  RIGHT/LEFT HEART CATH AND CORONARY/GRAFT ANGIOGRAPHY N/A 09/03/2019   Procedure: RIGHT/LEFT HEART CATH AND CORONARY/GRAFT ANGIOGRAPHY;  Surgeon: Leonie Man, MD;  Location: [facility];  Service: Cardiovascular;  Laterality: N/A;  . TEMPORARY PACEMAKER N/A 08/29/2019   Procedure: TEMPORARY PACEMAKER;  Surgeon: Sherren Mocha, MD;  Location: [facility];  Service:  Cardiovascular;  Laterality: N/A;  . TOTAL HIP ARTHROPLASTY Left    FH  Family History  Problem Relation Age of Onset  . Diabetes Father   . Dementia Father   . Heart attack Brother        multiple brothers with MIs  . Hypertension Mother   . Stroke Mother    [place]  reports that he has never smoked. He has never used smokeless tobacco. He reports that he does not drink alcohol or use drugs. Allergies  Allergies  Allergen Reactions  . Sulfa Antibiotics Rash   Home medications Prior to Admission medications   Medication Sig Start Date End Date Taking? Authorizing Provider  amitriptyline (ELAVIL) 50 MG tablet TAKE 1 AND 1/2 TABLETS BY MOUTH AT BEDTIME. Patient taking differently: Take 75 mg by mouth at bedtime.  05/06/19  Yes [name], MD  aspirin EC 81 MG tablet Take 81 mg by mouth daily.   Yes [provider]  atorvastatin (LIPITOR) 80 MG tablet TAKE ONE TABLET BY MOUTH ONCE DAILY. Patient taking differently: Take 80 mg by mouth daily.  06/03/19  Yes [name], MD  Cholecalciferol (VITAMIN D3) 25 MCG (1000 UT) CHEW Chew 1,000 Units by mouth daily.    Yes [provider]  clonazePAM (KLONOPIN) 0.25 MG disintegrating tablet DISSOLVE 1 TABLET BY MOUTH DAILY AS NEEDED FOR ANXIETY. Patient taking differently: Take 0.25 mg by mouth daily as needed (for anxiety- dissolve in the mouth).  07/22/19  Yes [name], MD  docusate sodium (COLACE) 100 MG capsule Take 100 mg by mouth 3 (three) times daily.    Yes [provider]  DULoxetine (CYMBALTA) 30 MG capsule Take 30 mg by mouth at bedtime.  10/16/18 10/16/19 Yes [provider]  finasteride (PROSCAR) 5 MG tablet Take 5 mg by mouth daily.  04/24/18  Yes [provider]  hydroxychloroquine (PLAQUENIL) 200 MG tablet Take 200 mg by mouth at bedtime.  04/24/18  Yes [provider]  insulin aspart protamine- aspart (NOVOLOG MIX 70/30) (70-30) 100 UNIT/ML injection Inject 12 units  into skin in the morning  and 8 units evening Patient taking differently: Inject 8-12 Units into the skin See admin instructions. Inject 12 units into the skin in the morning and 8 units in the evening 05/06/19  Yes [name], MD  methadone (DOLOPHINE) 10 MG tablet Take 10 mg by mouth every 6 (six) hours.  04/18/18  Yes [provider]  methadone (DOLOPHINE) 5 MG tablet Take 5 mg by mouth 3 (three) times daily as needed (for breakthrough pain).  04/18/18  Yes [provider]  nitroGLYCERIN (NITROSTAT) 0.4 MG SL tablet Place 1 tablet (0.4 mg total) under the tongue every 5 (five) minutes x 3 doses as needed for chest pain. 09/04/19  Yes [place], Tami Lin, PA  polyethylene glycol powder (MIRALAX) powder Take 17 g by mouth 2 (two) times daily. Patient taking differently: Take 17 g by mouth at bedtime.  03/01/18  Yes [name], MD  Polyvinyl Alcohol-Povidone (REFRESH OP) Place 1 drop into both eyes 3 (three) times daily as needed (for dry eyes).  Yes [provider]  predniSONE (DELTASONE) 5 MG tablet TAKE 1 TABLET BY MOUTH ONCE A DAY WITH BREAKFAST. Patient taking differently: Take 5 mg by mouth daily with breakfast.  06/03/19  Yes [name], MD  pregabalin (LYRICA) 25 MG capsule TAKE ONE CAPSULE BY MOUTH AT BEDTIME. Patient taking differently: Take 25 mg by mouth at bedtime.  06/03/19  Yes [name], MD  senna (SENOKOT) 8.6 MG TABS tablet Take 1 tablet by mouth 2 (two) times daily.   Yes [provider]  tamsulosin (FLOMAX) 0.4 MG CAPS capsule Take 1 capsule (0.4 mg total) by mouth at bedtime. 05/06/19  Yes [name], MD  torsemide (DEMADEX) 10 MG tablet TAKE 2 TABLETS BY MOUTH EVERY OTHER DAY. Patient taking differently: Take 20 mg by mouth every other day.  05/06/19  Yes [name], MD  blood glucose meter kit and supplies KIT Dispense based on patient and insurance preference. Use up to 3 times daily as directed. DX:E11.9  08/21/19   [name], MD    Current Medications Scheduled Meds: . amitriptyline  75 mg Oral QHS  . aspirin EC  81 mg Oral Daily  . atorvastatin  80 mg Oral Daily  . docusate sodium  100 mg Oral TID  . DULoxetine  30 mg Oral QHS  . enoxaparin (LOVENOX) injection  30 mg Subcutaneous Q24H  . finasteride  5 mg Oral Daily  . hydroxychloroquine  200 mg Oral QHS  . insulin aspart  0-9 Units Subcutaneous TID WC  . insulin aspart protamine- aspart  12 Units Subcutaneous Q breakfast  . insulin aspart protamine- aspart  8 Units Subcutaneous Q supper  . methadone  10 mg Oral Q6H  . polyethylene glycol  17 g Oral QHS  . predniSONE  5 mg Oral Q breakfast  . pregabalin  25 mg Oral QHS  . tamsulosin  0.4 mg Oral QHS   Continuous Infusions: . sodium chloride    . [START ON 09/13/2019] cefTRIAXone (ROCEPHIN)  IV     PRN Meds:.sodium chloride, acetaminophen **OR** acetaminophen, clonazePAM, nitroGLYCERIN  CBC Recent Labs  Lab 09/10/19 1218 09/11/19 1402 09/12/19 0228  WBC 8.0 8.3 6.3  HGB 8.7* 8.6* 8.6*  HCT 25.8* 28.2* 26.5*  MCV 91 100.0 97.1  PLT 158 151 563*   Basic Metabolic Panel Recent Labs  Lab 09/10/19 1218 09/11/19 1402 09/11/19 2057 09/12/19 0944 09/12/19 1255  NA 140 135  --   --  138  K 4.8 4.7  --   --  4.1  CL 99 100  --   --  101  CO2 25 24  --   --  27  GLUCOSE 131* 243*  --   --  92  BUN 43* 39*  --   --  36*  CREATININE 3.19* 3.12*  --  2.93* 2.80*  CALCIUM 9.0 9.2  --   --  8.8*  PHOS  --   --  5.2*  --   --     Physical Exam  Blood pressure (!) 124/49, pulse 66, temperature 98.6 F (37 C), temperature source Oral, resp. rate 18, height 5' 8" (1.727 m), weight 72.9 kg, SpO2 91 %. GEN: NAD, chronically ill-appearing ENT: NCAT EYES: EOMI CV: RRR, musical murmur loudest at right upper sternal border, 3 out of 6, systolic PULM: Clear movement throughout, normal work of breathing ABD: Soft nontender SKIN: No rashes EXT: No edema in the stump or  the right lower  extremity   Assessment 68 year old male with AoCKD3.  This is likely contrast mediated after left heart catheterization 9/8.  No other nephrotoxins identified.  No obstruction on ultrasound.  Thankfully, he appears to be improving with hydration and time.  1. AoCKD3, likely from contrast with [place] 9/8. 2. ICM / CAD / s and d CHF 3. Severe AS possibly for TAVF 4. PAD hx/o L BKA 5. RA on pred/plaquenil 6. DM 7. HTN  Plan 1. He appears to be improving.  I think we just need to follow this with time.  I think continuing oral hydration is reasonable as long as he is taking in good p.o. which he says he is.  Continue to hold diuretics.  Avoid nonsteroidals.  We will follow along. 2. Check C3 and C4 to consider atheroembolic disease but little else to suggest it 3. Daily weights, Daily Renal Panel, Strict I/Os, Avoid nephrotoxins (NSAIDs, judicious IV Contrast)    [name]  [phone] pgr 09/12/2019, 3:39 PM

## 2019-09-12 NOTE — Accreditation Note (Signed)
Urine culture collected 

## 2019-09-13 DIAGNOSIS — N3000 Acute cystitis without hematuria: Secondary | ICD-10-CM | POA: Diagnosis not present

## 2019-09-13 DIAGNOSIS — E1151 Type 2 diabetes mellitus with diabetic peripheral angiopathy without gangrene: Secondary | ICD-10-CM | POA: Diagnosis not present

## 2019-09-13 DIAGNOSIS — N179 Acute kidney failure, unspecified: Secondary | ICD-10-CM | POA: Diagnosis not present

## 2019-09-13 DIAGNOSIS — N183 Chronic kidney disease, stage 3 (moderate): Secondary | ICD-10-CM | POA: Diagnosis not present

## 2019-09-13 DIAGNOSIS — M05711 Rheumatoid arthritis with rheumatoid factor of right shoulder without organ or systems involvement: Secondary | ICD-10-CM | POA: Diagnosis not present

## 2019-09-13 DIAGNOSIS — I5030 Unspecified diastolic (congestive) heart failure: Secondary | ICD-10-CM | POA: Diagnosis not present

## 2019-09-13 DIAGNOSIS — I10 Essential (primary) hypertension: Secondary | ICD-10-CM | POA: Diagnosis not present

## 2019-09-13 DIAGNOSIS — I130 Hypertensive heart and chronic kidney disease with heart failure and stage 1 through stage 4 chronic kidney disease, or unspecified chronic kidney disease: Secondary | ICD-10-CM | POA: Diagnosis not present

## 2019-09-13 DIAGNOSIS — E1122 Type 2 diabetes mellitus with diabetic chronic kidney disease: Secondary | ICD-10-CM | POA: Diagnosis not present

## 2019-09-13 DIAGNOSIS — M05712 Rheumatoid arthritis with rheumatoid factor of left shoulder without organ or systems involvement: Secondary | ICD-10-CM | POA: Diagnosis not present

## 2019-09-13 LAB — BASIC METABOLIC PANEL
Anion gap: 9 (ref 5–15)
BUN: 33 mg/dL — ABNORMAL HIGH (ref 8–23)
CO2: 25 mmol/L (ref 22–32)
Calcium: 8.5 mg/dL — ABNORMAL LOW (ref 8.9–10.3)
Chloride: 104 mmol/L (ref 98–111)
Creatinine, Ser: 2.55 mg/dL — ABNORMAL HIGH (ref 0.61–1.24)
GFR calc Af Amer: 28 mL/min — ABNORMAL LOW (ref >60)
GFR calc non Af Amer: 24 mL/min — ABNORMAL LOW (ref >60)
Glucose, Bld: 119 mg/dL — ABNORMAL HIGH (ref 70–99)
Potassium: 4.3 mmol/L (ref 3.5–5.1)
Sodium: 138 mmol/L (ref 135–145)

## 2019-09-13 LAB — CBC WITH DIFFERENTIAL/PLATELET
Abs Immature Granulocytes: 0.02 10*3/uL (ref 0.00–0.07)
Basophils Absolute: 0 10*3/uL (ref 0.0–0.1)
Basophils Relative: 1 %
Eosinophils Absolute: 0.5 10*3/uL (ref 0.0–0.5)
Eosinophils Relative: 8 %
HCT: 24.4 % — ABNORMAL LOW (ref 39.0–52.0)
Hemoglobin: 7.6 g/dL — ABNORMAL LOW (ref 13.0–17.0)
Immature Granulocytes: 0 %
Lymphocytes Relative: 33 %
Lymphs Abs: 2 10*3/uL (ref 0.7–4.0)
MCH: 30.6 pg (ref 26.0–34.0)
MCHC: 31.1 g/dL (ref 30.0–36.0)
MCV: 98.4 fL (ref 80.0–100.0)
Monocytes Absolute: 0.4 10*3/uL (ref 0.1–1.0)
Monocytes Relative: 7 %
Neutro Abs: 3.1 10*3/uL (ref 1.7–7.7)
Neutrophils Relative %: 51 %
Platelets: 134 10*3/uL — ABNORMAL LOW (ref 150–400)
RBC: 2.48 MIL/uL — ABNORMAL LOW (ref 4.22–5.81)
RDW: 14.6 % (ref 11.5–15.5)
WBC: 6 10*3/uL (ref 4.0–10.5)
nRBC: 0 % (ref 0.0–0.2)

## 2019-09-13 LAB — C3 COMPLEMENT: C3 Complement: 76 mg/dL — ABNORMAL LOW (ref 82–167)

## 2019-09-13 LAB — GLUCOSE, CAPILLARY
Glucose-Capillary: 95 mg/dL (ref 70–99)
Glucose-Capillary: 77 mg/dL (ref 70–99)
Glucose-Capillary: 184 mg/dL — ABNORMAL HIGH (ref 70–99)
Glucose-Capillary: 172 mg/dL — ABNORMAL HIGH (ref 70–99)

## 2019-09-13 LAB — C4 COMPLEMENT: Complement C4, Body Fluid: 9 mg/dL — ABNORMAL LOW (ref 14–44)

## 2019-09-13 MED ORDER — SODIUM CHLORIDE 0.9 % IV SOLN
1.0000 g | INTRAVENOUS | Status: DC
  Administered 2019-09-13 – 2019-09-14 (×2): 1 g via INTRAVENOUS
  Filled 2019-09-13 (×3): qty 1

## 2019-09-13 MED ORDER — AMITRIPTYLINE HCL 25 MG PO TABS
75.0000 mg | ORAL_TABLET | Freq: Every day | ORAL | Status: DC
  Administered 2019-09-14: 75 mg via ORAL
  Filled 2019-09-13 (×2): qty 3

## 2019-09-13 NOTE — Telephone Encounter (Signed)
Noted! Thank you

## 2019-09-13 NOTE — Progress Notes (Signed)
PROGRESS NOTE  [name] TDD:[id number] DOB: 11-24-1951 DOA: 09/11/2019 PCP: [name] Kirschner, MD  HPI/Recap of past 24 hours: HPI from Dr [name] is a 68 y.o. 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  Went for a routine cardiology appointment, and patient's creatinine was found to be increased from baseline, the last known last week was 1.6 it was 3.1 and was advised to come to the ER.  Patient takes torsemide but denies taking any NSAIDs, denies nausea vomiting or diarrhea.  In the ED, repeat creatinine was 3.1, UA shows possibility of UTI.  Hemoglobin was 8.6 platelets 151 EKG shows paced rhythm.  Patient clinically appeared dehydrated.  Patient admitted for further management.    Today, patient denies any new complaints  Assessment/Plan: Principal Problem:   ARF (acute renal failure) (HCC) Active Problems:   Rheumatoid arthritis ([place])   Hypertension   Type 2 diabetes mellitus with peripheral vascular disease (HCC)   Acute on chronic kidney disease stage III Last creatinine PTA was 1.67, on admission 3.19-->2.8-->2.5 Patient on torsemide at home Renal ultrasound showed negative for hydronephrosis, shadowing calcifications within the kidneys Hold home torsemide for now Nephrology on board Advised adequate oral intake, avoid nephrotoxic Daily BMP  UTI UA with moderate leukocytes, greater than 50 WBC UC growing 30,000 pseudomonas aeruginosa Switch to cefepime  Diabetes mellitus type 2 SSI, home 70/30, Accu-Cheks, hypoglycemic protocol  Anemia of chronic kidney disease Hemoglobin at baseline, noted a drop overnight Daily CBC  CAD status post  CABG/severe aortic stenosis Currently chest pain-free Continue aspirin, statin Patient planned for possible TAVR  Intermittent complete heart block status post pacemaker placement Stable  History of rheumatoid arthritis Continue steroids, Plaquenil            Malnutrition Type:      Malnutrition Characteristics:      Nutrition Interventions:       Estimated body mass index is 24.27 kg/m as calculated from the following:   Height as of this encounter: 5\' 8"  (1.727 m).   Weight as of this encounter: 72.4 kg.     Code Status: Full  Family Communication: Wife at bedside  Disposition Plan: To be determined, likely home   Consultants:  Nephrology  Procedures:  None  Antimicrobials:  Cefepime  DVT prophylaxis: Lovenox   Objective: Vitals:   09/13/19 0055 09/13/19 0542 09/13/19 0852 09/13/19 1225  BP: 111/78 (!) 137/58 98/81 102/64  Pulse: (!) 59 74 83 75  Resp: 16 17 20 20   Temp: 98.4 F (36.9 C) 98 F (36.7 C) 98.4 F (36.9 C) 97.7 F (36.5 C)  TempSrc: Oral Oral Oral Oral  SpO2: 98% 97% 92% 100%  Weight:  72.4 kg    Height:        Intake/Output Summary (Last 24 hours) at 09/13/2019 1839 Last data filed at 09/13/2019 1814 Gross per 24 hour  Intake 960 ml  Output 1175 ml  Net -215 ml   Filed Weights   09/11/19 1352 09/12/19 0640 09/13/19 0542  Weight: 79.4 kg 72.9 kg 72.4 kg    Exam:  General: NAD   Cardiovascular: S1, S2 present  Respiratory: CTAB  Abdomen: Soft, nontender, nondistended, bowel sounds present  Musculoskeletal: No pedal edema noted, left BKA  Skin:  Chronic skin changes  Psychiatry: Normal mood   Data Reviewed: CBC: Recent Labs  Lab 09/10/19 1218 09/11/19 1402 09/12/19 0228 09/13/19 0512  WBC 8.0 8.3 6.3 6.0  NEUTROABS  --   --   --  3.1  HGB 8.7* 8.6* 8.6* 7.6*  HCT 25.8* 28.2* 26.5* 24.4*  MCV 91 100.0 97.1 98.4  PLT 158 151 141* 272*   Basic Metabolic Panel: Recent Labs  Lab  09/10/19 1218 09/11/19 1402 09/11/19 2057 09/12/19 0944 09/12/19 1255 09/13/19 0512  NA 140 135  --   --  138 138  K 4.8 4.7  --   --  4.1 4.3  CL 99 100  --   --  101 104  CO2 25 24  --   --  27 25  GLUCOSE 131* 243*  --   --  92 119*  BUN 43* 39*  --   --  36* 33*  CREATININE 3.19* 3.12*  --  2.93* 2.80* 2.55*  CALCIUM 9.0 9.2  --   --  8.8* 8.5*  MG  --   --  2.3  --   --   --   PHOS  --   --  5.2*  --   --   --    GFR: Estimated Creatinine Clearance: 26.1 mL/min (A) (by C-G formula based on SCr of 2.55 mg/dL (H)). Liver Function Tests: No results for input(s): AST, ALT, ALKPHOS, BILITOT, PROT, ALBUMIN in the last 168 hours. No results for input(s): LIPASE, AMYLASE in the last 168 hours. No results for input(s): AMMONIA in the last 168 hours. Coagulation Profile: No results for input(s): INR, PROTIME in the last 168 hours. Cardiac Enzymes: No results for input(s): CKTOTAL, CKMB, CKMBINDEX, TROPONINI in the last 168 hours. BNP (last 3 results) Recent Labs    09/10/19 1218  PROBNP 15,761*   HbA1C: No results for input(s): HGBA1C in the last 72 hours. CBG: Recent Labs  Lab 09/12/19 1707 09/12/19 2224 09/13/19 0651 09/13/19 1127 09/13/19 1738  GLUCAP 199* 153* 95 77 184*   Lipid Profile: No results for input(s): CHOL, HDL, LDLCALC, TRIG, CHOLHDL, LDLDIRECT in the last 72 hours. Thyroid Function Tests: No results for input(s): TSH, T4TOTAL, FREET4, T3FREE, THYROIDAB in the last 72 hours. Anemia Panel: No results for input(s): VITAMINB12, FOLATE, FERRITIN, TIBC, IRON, RETICCTPCT in the last 72 hours. Urine analysis:    Component Value Date/Time   COLORURINE YELLOW 09/11/2019 [place] 09/11/2019 2315   LABSPEC 1.010 09/11/2019 2315   PHURINE 6.0 09/11/2019 2315   GLUCOSEU 50 (A) 09/11/2019 2315   HGBUR NEGATIVE 09/11/2019 2315   BILIRUBINUR NEGATIVE 09/11/2019 2315   KETONESUR NEGATIVE 09/11/2019 2315   PROTEINUR NEGATIVE 09/11/2019 2315    UROBILINOGEN 0.2 08/12/2014 2358   NITRITE NEGATIVE 09/11/2019 2315   LEUKOCYTESUR MODERATE (A) 09/11/2019 2315   Sepsis Labs: @LABRCNTIP (procalcitonin:4,lacticidven:4)  ) Recent Results (from the past 240 hour(s))  SARS CORONAVIRUS 2 (TAT 6-24 HRS) Nasopharyngeal Nasopharyngeal Swab     Status: None   Collection Time: 09/11/19  9:18 PM   Specimen: Nasopharyngeal Swab  Result Value Ref Range Status   SARS Coronavirus 2 NEGATIVE NEGATIVE Final    Comment: (NOTE) SARS-CoV-2 target nucleic acids are NOT DETECTED. The SARS-CoV-2 RNA is generally detectable in upper and lower respiratory specimens during the acute phase of infection. Negative results do not preclude SARS-CoV-2 infection, do not rule out co-infections with other  pathogens, and should not be used as the sole basis for treatment or other patient management decisions. Negative results must be combined with clinical observations, patient history, and epidemiological information. The expected result is Negative. Fact Sheet for Patients: SugarRoll.be Fact Sheet for Healthcare Providers: https://www.woods-mathews.com/ This test is not yet approved or cleared by the [place] FDA and  has been authorized for detection and/or diagnosis of SARS-CoV-2 by FDA under an Emergency Use Authorization (EUA). This EUA will remain  in effect (meaning this test can be used) for the duration of the COVID-19 declaration under Section 56 4(b)(1) of the Act, 21 U.S.C. section 360bbb-3(b)(1), unless the authorization is terminated or revoked sooner. Performed at [facility], [place] [address] [place], [place] [address]   Culture, Urine     Status: Abnormal (Preliminary result)   Collection Time: 09/12/19  7:45 AM   Specimen: Urine, Random  Result Value Ref Range Status   Specimen Description URINE, RANDOM  Final   Special Requests   Final    NONE Performed at [facility],  [place] [address] [place], [place] [address]    Culture 30,000 COLONIES/mL PSEUDOMONAS AERUGINOSA (A)  Final   Report Status PENDING  Incomplete      Studies: No results found.  Scheduled Meds: . aspirin EC  81 mg Oral Daily  . atorvastatin  80 mg Oral Daily  . docusate sodium  100 mg Oral TID  . DULoxetine  30 mg Oral QHS  . enoxaparin (LOVENOX) injection  30 mg Subcutaneous Q24H  . finasteride  5 mg Oral Daily  . hydroxychloroquine  200 mg Oral QHS  . insulin aspart  0-9 Units Subcutaneous TID WC  . insulin aspart protamine- aspart  12 Units Subcutaneous Q breakfast  . insulin aspart protamine- aspart  8 Units Subcutaneous Q supper  . methadone  10 mg Oral Q6H  . polyethylene glycol  17 g Oral QHS  . predniSONE  5 mg Oral Q breakfast  . pregabalin  25 mg Oral QHS  . tamsulosin  0.4 mg Oral QHS    Continuous Infusions: . sodium chloride    . amitriptyline (ELAVIL) tablet 75 mg    . ceFEPime (MAXIPIME) IV 1 g (09/13/19 1757)     LOS: 0 days     Alma Friendly, MD [facility]  If 7PM-7AM, please contact night-coverage [email or web address] 09/13/2019, 6:39 PM

## 2019-09-13 NOTE — Progress Notes (Signed)
[facility] Progress Note    Assessment/ Plan:   Assessment 68 year old male with AoCKD3.  This is likely contrast mediated after left heart catheterization 9/8.  No other nephrotoxins identified.  No obstruction on ultrasound.  Thankfully, he appears to be improving with hydration and time.  1. AoCKD3, likely from contrast with [place] 9/8. 2. ICM / CAD / s and d CHF 3. Severe AS possibly for TAVF 4. PAD hx/o L BKA 5. RA on pred/plaquenil 6. DM 7. HTN  Plan 1. He appears to be improving which is excellent.  I think we just need to follow this with time.  I think continuing oral hydration is reasonable as long as he is taking in good p.o. which he says he is.  Continue to hold diuretics.  Avoid nonsteroidals.  We will follow along. 2. Check C3 and C4 to consider atheroembolic disease but little else to suggest it--> these are low, difficult to interpret in the setting of known RA.  No hematuria on UA, WBC > 50 on UA but UCx + with pseudomonas 30,000 u, sensitivities pending, will switch to cefepime as he's on plaquenil and don't want to prolong QT with quinolone 3. Daily weights, Daily Renal Panel,  Subjective:    Sleeping, easily arousable.  Wife at bedside   Objective:   BP 102/64 (BP Location: Right Arm)   Pulse 75   Temp 97.7 F (36.5 C) (Oral)   Resp 20   Ht 5\' 8"  (1.727 m)   Wt 72.4 kg   SpO2 100%   BMI 24.27 kg/m   Intake/Output Summary (Last 24 hours) at 09/13/2019 1623 Last data filed at 09/13/2019 0844 Gross per 24 hour  Intake 480 ml  Output 700 ml  Net -220 ml   Weight change: -6.979 kg  Physical Exam: Gen:NAD, sleeping, arousable CVS: RRR Resp: clear bilaterally no c/w/r Abd: soft, nontender Ext: s/p L BKA, R leg with a small scabbed lesion anterior shin MSK: ulnar deviation of fingers bilaterally  Imaging: US Renal  Result Date: 09/11/2019 CLINICAL DATA:  Chronic kidney disease EXAM: RENAL / URINARY TRACT ULTRASOUND COMPLETE  COMPARISON:  07/01/2019, CT 10/15/2016 FINDINGS: Right Kidney: Renal measurements: 10 x 5.4 x 5.5 cm = volume: 156.8 mL. Cortical echogenicity within normal limits. No hydronephrosis. Shadowing calcification at the lower pole. Mild renal cortical thinning. Left Kidney: Renal measurements: 10 x 5 x 4.7 cm = volume: 124.6 mL. Cortical echogenicity within normal limits. No hydronephrosis. Shadowing calcification at the midpole. Mild cortical thinning. Bladder: Appears normal for degree of bladder distention. IMPRESSION: 1. Negative for hydronephrosis. 2. Shadowing calcifications within the kidneys, uncertain if this is due to vascular calcification or kidney stones. Electronically Signed   By: Donavan Foil M.D.   On: 09/11/2019 23:52    Labs: BMET Recent Labs  Lab 09/10/19 1218 09/11/19 1402 09/11/19 2057 09/12/19 0944 09/12/19 1255 09/13/19 0512  NA 140 135  --   --  138 138  K 4.8 4.7  --   --  4.1 4.3  CL 99 100  --   --  101 104  CO2 25 24  --   --  27 25  GLUCOSE 131* 243*  --   --  92 119*  BUN 43* 39*  --   --  36* 33*  CREATININE 3.19* 3.12*  --  2.93* 2.80* 2.55*  CALCIUM 9.0 9.2  --   --  8.8* 8.5*  PHOS  --   --  5.2*  --   --   --  CBC Recent Labs  Lab 09/10/19 1218 09/11/19 1402 09/12/19 0228 09/13/19 0512  WBC 8.0 8.3 6.3 6.0  NEUTROABS  --   --   --  3.1  HGB 8.7* 8.6* 8.6* 7.6*  HCT 25.8* 28.2* 26.5* 24.4*  MCV 91 100.0 97.1 98.4  PLT 158 151 141* 134*    Medications:    . aspirin EC  81 mg Oral Daily  . atorvastatin  80 mg Oral Daily  . docusate sodium  100 mg Oral TID  . DULoxetine  30 mg Oral QHS  . enoxaparin (LOVENOX) injection  30 mg Subcutaneous Q24H  . finasteride  5 mg Oral Daily  . hydroxychloroquine  200 mg Oral QHS  . insulin aspart  0-9 Units Subcutaneous TID WC  . insulin aspart protamine- aspart  12 Units Subcutaneous Q breakfast  . insulin aspart protamine- aspart  8 Units Subcutaneous Q supper  . methadone  10 mg Oral Q6H  .  polyethylene glycol  17 g Oral QHS  . predniSONE  5 mg Oral Q breakfast  . pregabalin  25 mg Oral QHS  . tamsulosin  0.4 mg Oral QHS      Madelon Lips, MD 09/13/2019, 4:23 PM

## 2019-09-13 NOTE — Progress Notes (Addendum)
Pts Elavil dose was due at 2200 but the Mar wouldn't accept the separate doses , Pharmacy was called. We had to enter medication manually.   Changes have been ,ade by pharmacy to pts  Mar.

## 2019-09-14 DIAGNOSIS — I10 Essential (primary) hypertension: Secondary | ICD-10-CM | POA: Diagnosis not present

## 2019-09-14 DIAGNOSIS — M05712 Rheumatoid arthritis with rheumatoid factor of left shoulder without organ or systems involvement: Secondary | ICD-10-CM | POA: Diagnosis not present

## 2019-09-14 DIAGNOSIS — I5030 Unspecified diastolic (congestive) heart failure: Secondary | ICD-10-CM | POA: Diagnosis not present

## 2019-09-14 DIAGNOSIS — N179 Acute kidney failure, unspecified: Secondary | ICD-10-CM | POA: Diagnosis not present

## 2019-09-14 DIAGNOSIS — N3000 Acute cystitis without hematuria: Secondary | ICD-10-CM | POA: Diagnosis not present

## 2019-09-14 DIAGNOSIS — M05711 Rheumatoid arthritis with rheumatoid factor of right shoulder without organ or systems involvement: Secondary | ICD-10-CM | POA: Diagnosis not present

## 2019-09-14 DIAGNOSIS — E1151 Type 2 diabetes mellitus with diabetic peripheral angiopathy without gangrene: Secondary | ICD-10-CM | POA: Diagnosis not present

## 2019-09-14 DIAGNOSIS — I130 Hypertensive heart and chronic kidney disease with heart failure and stage 1 through stage 4 chronic kidney disease, or unspecified chronic kidney disease: Secondary | ICD-10-CM | POA: Diagnosis not present

## 2019-09-14 DIAGNOSIS — N183 Chronic kidney disease, stage 3 (moderate): Secondary | ICD-10-CM | POA: Diagnosis not present

## 2019-09-14 DIAGNOSIS — E1122 Type 2 diabetes mellitus with diabetic chronic kidney disease: Secondary | ICD-10-CM | POA: Diagnosis not present

## 2019-09-14 LAB — CBC WITH DIFFERENTIAL/PLATELET
Abs Immature Granulocytes: 0.03 10*3/uL (ref 0.00–0.07)
Basophils Absolute: 0.1 10*3/uL (ref 0.0–0.1)
Basophils Relative: 1 %
Eosinophils Absolute: 0.5 10*3/uL (ref 0.0–0.5)
Eosinophils Relative: 8 %
HCT: 25.2 % — ABNORMAL LOW (ref 39.0–52.0)
Hemoglobin: 8.1 g/dL — ABNORMAL LOW (ref 13.0–17.0)
Immature Granulocytes: 0 %
Lymphocytes Relative: 31 %
Lymphs Abs: 2.1 10*3/uL (ref 0.7–4.0)
MCH: 31.8 pg (ref 26.0–34.0)
MCHC: 32.1 g/dL (ref 30.0–36.0)
MCV: 98.8 fL (ref 80.0–100.0)
Monocytes Absolute: 0.6 10*3/uL (ref 0.1–1.0)
Monocytes Relative: 8 %
Neutro Abs: 3.5 10*3/uL (ref 1.7–7.7)
Neutrophils Relative %: 52 %
Platelets: 132 10*3/uL — ABNORMAL LOW (ref 150–400)
RBC: 2.55 MIL/uL — ABNORMAL LOW (ref 4.22–5.81)
RDW: 14.7 % (ref 11.5–15.5)
WBC: 6.7 10*3/uL (ref 4.0–10.5)
nRBC: 0 % (ref 0.0–0.2)

## 2019-09-14 LAB — BASIC METABOLIC PANEL
Anion gap: 9 (ref 5–15)
BUN: 30 mg/dL — ABNORMAL HIGH (ref 8–23)
CO2: 25 mmol/L (ref 22–32)
Calcium: 8.4 mg/dL — ABNORMAL LOW (ref 8.9–10.3)
Chloride: 104 mmol/L (ref 98–111)
Creatinine, Ser: 2.14 mg/dL — ABNORMAL HIGH (ref 0.61–1.24)
GFR calc Af Amer: 35 mL/min — ABNORMAL LOW (ref >60)
GFR calc non Af Amer: 30 mL/min — ABNORMAL LOW (ref >60)
Glucose, Bld: 180 mg/dL — ABNORMAL HIGH (ref 70–99)
Potassium: 4.6 mmol/L (ref 3.5–5.1)
Sodium: 138 mmol/L (ref 135–145)

## 2019-09-14 LAB — GLUCOSE, CAPILLARY
Glucose-Capillary: 191 mg/dL — ABNORMAL HIGH (ref 70–99)
Glucose-Capillary: 74 mg/dL (ref 70–99)
Glucose-Capillary: 136 mg/dL — ABNORMAL HIGH (ref 70–99)
Glucose-Capillary: 107 mg/dL — ABNORMAL HIGH (ref 70–99)
Glucose-Capillary: 111 mg/dL — ABNORMAL HIGH (ref 70–99)

## 2019-09-14 NOTE — Plan of Care (Signed)
  Problem: Education: Goal: Ability to demonstrate management of disease process will improve Outcome: Progressing   Problem: Education: Goal: Ability to verbalize understanding of medication therapies will improve Outcome: Progressing   Problem: Health Behavior/Discharge Planning: Goal: Ability to manage health-related needs will improve Outcome: Progressing   Problem: Health Behavior/Discharge Planning: Goal: Ability to manage health-related needs will improve Outcome: Progressing   Problem: Elimination: Goal: Will not experience complications related to bowel motility Outcome: Progressing   Problem: Elimination: Goal: Will not experience complications related to urinary retention Outcome: Progressing   Problem: Skin Integrity: Goal: Risk for impaired skin integrity will decrease Outcome: Progressing

## 2019-09-14 NOTE — Progress Notes (Signed)
PROGRESS NOTE  [name] HAL:[id number] DOB: 1951/11/26 DOA: 09/11/2019 PCP: [name], MD  HPI/Recap of past 24 hours: HPI from Dr [name] is a 68 y.o. 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  Went for a routine cardiology appointment, and patient's creatinine was found to be increased from baseline, the last known last week was 1.6 it was 3.1 and was advised to come to the ER.  Patient takes torsemide but denies taking any NSAIDs, denies nausea vomiting or diarrhea.  In the ED, repeat creatinine was 3.1, UA shows possibility of UTI.  Hemoglobin was 8.6 platelets 151 EKG shows paced rhythm.  Patient clinically appeared dehydrated.  Patient admitted for further management.    Today, patient denies any new complaints  Assessment/Plan: Principal Problem:   ARF (acute renal failure) (HCC) Active Problems:   Rheumatoid arthritis ([place])   Hypertension   Type 2 diabetes mellitus with peripheral vascular disease (HCC)   Acute on chronic kidney disease stage III Last creatinine PTA was 1.67, on admission 3.19-->2.8-->2.5-->2.14 Patient on torsemide at home Renal ultrasound showed negative for hydronephrosis, shadowing calcifications within the kidneys Hold home torsemide for now Nephrology on board Advised adequate oral intake, avoid nephrotoxic Daily BMP  UTI UA with moderate leukocytes, greater than 50 WBC UC growing 30,000 pseudomonas aeruginosa, awaiting sensitivities Switch to cefepime, plan to switch to p.o. once sensitivities are available  Diabetes mellitus type 2 SSI, home 70/30, Accu-Cheks, hypoglycemic protocol  Anemia of chronic kidney disease  Hemoglobin at baseline Daily CBC  CAD status post CABG/severe aortic stenosis Currently chest pain-free Continue aspirin, statin Patient planned for possible TAVR  Intermittent complete heart block status post pacemaker placement Stable  History of rheumatoid arthritis Continue steroids, Plaquenil            Malnutrition Type:      Malnutrition Characteristics:      Nutrition Interventions:       Estimated body mass index is 25.11 kg/m as calculated from the following:   Height as of this encounter: 5\' 8"  (1.727 m).   Weight as of this encounter: 74.9 kg.     Code Status: Full  Family Communication: Wife at bedside  Disposition Plan: To be determined, likely home   Consultants:  Nephrology  Procedures:  None  Antimicrobials:  Cefepime  DVT prophylaxis: Lovenox   Objective: Vitals:   09/14/19 0215 09/14/19 0615 09/14/19 0945 09/14/19 1241  BP:  (!) 110/45 118/74 (!) 113/35  Pulse:  60 88 (!) 59  Resp:  20  18  Temp:  98.8 F (37.1 C)  97.6 F (36.4 C)  TempSrc:  Oral  Oral  SpO2:  96%  100%  Weight: 74.9 kg     Height:        Intake/Output Summary (Last 24 hours) at 09/14/2019 1828 Last data filed at 09/14/2019 1500 Gross per 24 hour  Intake 1111.33 ml  Output 750 ml  Net 361.33 ml   Filed Weights   09/12/19 0640 09/13/19 0542 09/14/19 0215  Weight: 72.9 kg 72.4 kg 74.9 kg    Exam:  General: NAD   Cardiovascular: S1, S2 present  Respiratory: CTAB  Abdomen: Soft, nontender, nondistended, bowel sounds  present  Musculoskeletal: No pedal edema noted, left BKA  Skin:  Chronic skin changes  Psychiatry: Normal mood   Data Reviewed: CBC: Recent Labs  Lab 09/10/19 1218 09/11/19 1402 09/12/19 0228 09/13/19 0512 09/14/19 0524  WBC 8.0 8.3 6.3 6.0 6.7  NEUTROABS  --   --   --  3.1 3.5  HGB 8.7* 8.6* 8.6* 7.6* 8.1*  HCT 25.8* 28.2* 26.5* 24.4* 25.2*  MCV 91 100.0 97.1 98.4 98.8  PLT 158 151 141* 134* 132*    Basic Metabolic Panel: Recent Labs  Lab 09/10/19 1218 09/11/19 1402 09/11/19 2057 09/12/19 0944 09/12/19 1255 09/13/19 0512 09/14/19 0524  NA 140 135  --   --  138 138 138  K 4.8 4.7  --   --  4.1 4.3 4.6  CL 99 100  --   --  101 104 104  CO2 25 24  --   --  27 25 25   GLUCOSE 131* 243*  --   --  92 119* 180*  BUN 43* 39*  --   --  36* 33* 30*  CREATININE 3.19* 3.12*  --  2.93* 2.80* 2.55* 2.14*  CALCIUM 9.0 9.2  --   --  8.8* 8.5* 8.4*  MG  --   --  2.3  --   --   --   --   PHOS  --   --  5.2*  --   --   --   --    GFR: Estimated Creatinine Clearance: 31.1 mL/min (A) (by C-G formula based on SCr of 2.14 mg/dL (H)). Liver Function Tests: No results for input(s): AST, ALT, ALKPHOS, BILITOT, PROT, ALBUMIN in the last 168 hours. No results for input(s): LIPASE, AMYLASE in the last 168 hours. No results for input(s): AMMONIA in the last 168 hours. Coagulation Profile: No results for input(s): INR, PROTIME in the last 168 hours. Cardiac Enzymes: No results for input(s): CKTOTAL, CKMB, CKMBINDEX, TROPONINI in the last 168 hours. BNP (last 3 results) Recent Labs    09/10/19 1218  PROBNP 15,761*   HbA1C: No results for input(s): HGBA1C in the last 72 hours. CBG: Recent Labs  Lab 09/13/19 2121 09/14/19 0637 09/14/19 1141 09/14/19 1558 09/14/19 1655  GLUCAP 172* 191* 74 136* 107*   Lipid Profile: No results for input(s): CHOL, HDL, LDLCALC, TRIG, CHOLHDL, LDLDIRECT in the last 72 hours. Thyroid Function Tests: No results for input(s): TSH, T4TOTAL, FREET4, T3FREE, THYROIDAB in the last 72 hours. Anemia Panel: No results for input(s): VITAMINB12, FOLATE, FERRITIN, TIBC, IRON, RETICCTPCT in the last 72 hours. Urine analysis:    Component Value Date/Time   COLORURINE YELLOW 09/11/2019 [place] 09/11/2019 2315   LABSPEC 1.010 09/11/2019 2315   PHURINE 6.0 09/11/2019 2315   GLUCOSEU 50 (A) 09/11/2019 2315   HGBUR NEGATIVE 09/11/2019 2315   BILIRUBINUR  NEGATIVE 09/11/2019 2315   KETONESUR NEGATIVE 09/11/2019 2315   PROTEINUR NEGATIVE 09/11/2019 2315   UROBILINOGEN 0.2 08/12/2014 2358   NITRITE NEGATIVE 09/11/2019 2315   LEUKOCYTESUR MODERATE (A) 09/11/2019 2315   Sepsis Labs: @LABRCNTIP (procalcitonin:4,lacticidven:4)  ) Recent Results (from the past 240 hour(s))  SARS CORONAVIRUS 2 (TAT 6-24 HRS) Nasopharyngeal Nasopharyngeal Swab     Status: None   Collection Time: 09/11/19  9:18 PM   Specimen: Nasopharyngeal Swab  Result Value Ref Range Status   SARS Coronavirus 2 NEGATIVE NEGATIVE Final    Comment: (NOTE) SARS-CoV-2 target nucleic acids are NOT DETECTED. The SARS-CoV-2 RNA is generally detectable  in upper and lower respiratory specimens during the acute phase of infection. Negative results do not preclude SARS-CoV-2 infection, do not rule out co-infections with other pathogens, and should not be used as the sole basis for treatment or other patient management decisions. Negative results must be combined with clinical observations, patient history, and epidemiological information. The expected result is Negative. Fact Sheet for Patients: SugarRoll.be Fact Sheet for Healthcare Providers: https://www.woods-mathews.com/ This test is not yet approved or cleared by the [place] FDA and  has been authorized for detection and/or diagnosis of SARS-CoV-2 by FDA under an Emergency Use Authorization (EUA). This EUA will remain  in effect (meaning this test can be used) for the duration of the COVID-19 declaration under Section 56 4(b)(1) of the Act, 21 U.S.C. section 360bbb-3(b)(1), unless the authorization is terminated or revoked sooner. Performed at [facility], [place] [address] [place], [place] [address]   Culture, Urine     Status: Abnormal (Preliminary result)   Collection Time: 09/12/19  7:45 AM   Specimen: Urine, Random  Result Value Ref Range Status   Specimen  Description URINE, RANDOM  Final   Special Requests NONE  Final   Culture (A)  Final    30,000 COLONIES/mL PSEUDOMONAS AERUGINOSA SUSCEPTIBILITIES TO FOLLOW Performed at [facility], [place] [address] [place], [place] [address]    Report Status PENDING  Incomplete      Studies: No results found.  Scheduled Meds: . amitriptyline  75 mg Oral QHS  . aspirin EC  81 mg Oral Daily  . atorvastatin  80 mg Oral Daily  . docusate sodium  100 mg Oral TID  . DULoxetine  30 mg Oral QHS  . enoxaparin (LOVENOX) injection  30 mg Subcutaneous Q24H  . finasteride  5 mg Oral Daily  . hydroxychloroquine  200 mg Oral QHS  . insulin aspart  0-9 Units Subcutaneous TID WC  . insulin aspart protamine- aspart  12 Units Subcutaneous Q breakfast  . insulin aspart protamine- aspart  8 Units Subcutaneous Q supper  . methadone  10 mg Oral Q6H  . polyethylene glycol  17 g Oral QHS  . predniSONE  5 mg Oral Q breakfast  . pregabalin  25 mg Oral QHS  . tamsulosin  0.4 mg Oral QHS    Continuous Infusions: . sodium chloride    . ceFEPime (MAXIPIME) IV 1 g (09/14/19 1646)     LOS: 0 days     Alma Friendly, MD [facility]  If 7PM-7AM, please contact night-coverage [email or web address] 09/14/2019, 6:28 PM

## 2019-09-14 NOTE — Progress Notes (Signed)
[facility] Progress Note    Assessment/ Plan:   Assessment 68 year old male with AoCKD3.  This is likely contrast mediated after left heart catheterization 9/8.  No other nephrotoxins identified.  No obstruction on ultrasound.  Thankfully, he appears to be improving with hydration and time.  1. AoCKD3, likely from contrast with [place] 9/8. 2. ICM / CAD / s and d CHF 3. Severe AS possibly for TAVF 4. PAD hx/o L BKA 5. RA on pred/plaquenil 6. DM 7. HTN  Plan 1. He appears to be improving which is excellent.  I think we just need to follow this with time.  I think continuing oral hydration is reasonable as long as he is taking in good p.o. which he says he is.  Continue to hold diuretics.  Avoid nonsteroidals.  We will follow along. Cr downtrending very nicely, OK from renal perspective to go today. If can be switched to oral abx.  We will sign off.  2. Check C3 and C4 to consider atheroembolic disease but little else to suggest it--> these are low, difficult to interpret in the setting of known RA.  No hematuria on UA, WBC > 50 on UA but UCx + with pseudomonas 30,000 u, sensitivities pending, will switch to cefepime as he's on plaquenil and don't want to prolong QT with quinolone; if sensitivities come back can switch to oral.   3. Daily weights, Daily Renal Panel,  Subjective:    Cr downtrending, looks great.  OK to go from renal perspective   Objective:   BP 118/74   Pulse 88   Temp 98.8 F (37.1 C) (Oral)   Resp 20   Ht 5\' 8"  (1.727 m)   Wt 74.9 kg Comment: B  SpO2 96%   BMI 25.11 kg/m   Intake/Output Summary (Last 24 hours) at 09/14/2019 1005 Last data filed at 09/14/2019 0900 Gross per 24 hour  Intake 1371.33 ml  Output 825 ml  Net 546.33 ml   Weight change: 2.5 kg  Physical Exam: Gen:NAD, sleeping, arousable CVS: RRR Resp: clear bilaterally no c/w/r Abd: soft, nontender Ext: s/p L BKA, R leg with a small scabbed lesion anterior shin MSK: ulnar  deviation of fingers bilaterally  Imaging: No results found.  Labs: BMET Recent Labs  Lab 09/10/19 1218 09/11/19 1402 09/11/19 2057 09/12/19 0944 09/12/19 1255 09/13/19 0512 09/14/19 0524  NA 140 135  --   --  138 138 138  K 4.8 4.7  --   --  4.1 4.3 4.6  CL 99 100  --   --  101 104 104  CO2 25 24  --   --  27 25 25   GLUCOSE 131* 243*  --   --  92 119* 180*  BUN 43* 39*  --   --  36* 33* 30*  CREATININE 3.19* 3.12*  --  2.93* 2.80* 2.55* 2.14*  CALCIUM 9.0 9.2  --   --  8.8* 8.5* 8.4*  PHOS  --   --  5.2*  --   --   --   --    CBC Recent Labs  Lab 09/11/19 1402 09/12/19 0228 09/13/19 0512 09/14/19 0524  WBC 8.3 6.3 6.0 6.7  NEUTROABS  --   --  3.1 3.5  HGB 8.6* 8.6* 7.6* 8.1*  HCT 28.2* 26.5* 24.4* 25.2*  MCV 100.0 97.1 98.4 98.8  PLT 151 141* 134* 132*    Medications:    . amitriptyline  75 mg Oral QHS  .  aspirin EC  81 mg Oral Daily  . atorvastatin  80 mg Oral Daily  . docusate sodium  100 mg Oral TID  . DULoxetine  30 mg Oral QHS  . enoxaparin (LOVENOX) injection  30 mg Subcutaneous Q24H  . finasteride  5 mg Oral Daily  . hydroxychloroquine  200 mg Oral QHS  . insulin aspart  0-9 Units Subcutaneous TID WC  . insulin aspart protamine- aspart  12 Units Subcutaneous Q breakfast  . insulin aspart protamine- aspart  8 Units Subcutaneous Q supper  . methadone  10 mg Oral Q6H  . polyethylene glycol  17 g Oral QHS  . predniSONE  5 mg Oral Q breakfast  . pregabalin  25 mg Oral QHS  . tamsulosin  0.4 mg Oral QHS      Madelon Lips, MD 09/14/2019, 10:05 AM

## 2019-09-14 NOTE — Plan of Care (Signed)

## 2019-09-15 DIAGNOSIS — I2510 Atherosclerotic heart disease of native coronary artery without angina pectoris: Secondary | ICD-10-CM | POA: Diagnosis present

## 2019-09-15 DIAGNOSIS — T508X5A Adverse effect of diagnostic agents, initial encounter: Secondary | ICD-10-CM | POA: Diagnosis present

## 2019-09-15 DIAGNOSIS — M05711 Rheumatoid arthritis with rheumatoid factor of right shoulder without organ or systems involvement: Secondary | ICD-10-CM | POA: Diagnosis not present

## 2019-09-15 DIAGNOSIS — I10 Essential (primary) hypertension: Secondary | ICD-10-CM | POA: Diagnosis not present

## 2019-09-15 DIAGNOSIS — M05712 Rheumatoid arthritis with rheumatoid factor of left shoulder without organ or systems involvement: Secondary | ICD-10-CM | POA: Diagnosis not present

## 2019-09-15 DIAGNOSIS — I130 Hypertensive heart and chronic kidney disease with heart failure and stage 1 through stage 4 chronic kidney disease, or unspecified chronic kidney disease: Secondary | ICD-10-CM | POA: Diagnosis present

## 2019-09-15 DIAGNOSIS — E1122 Type 2 diabetes mellitus with diabetic chronic kidney disease: Secondary | ICD-10-CM | POA: Diagnosis present

## 2019-09-15 DIAGNOSIS — D631 Anemia in chronic kidney disease: Secondary | ICD-10-CM | POA: Diagnosis present

## 2019-09-15 DIAGNOSIS — I442 Atrioventricular block, complete: Secondary | ICD-10-CM | POA: Diagnosis not present

## 2019-09-15 DIAGNOSIS — N141 Nephropathy induced by other drugs, medicaments and biological substances: Secondary | ICD-10-CM | POA: Diagnosis present

## 2019-09-15 DIAGNOSIS — E860 Dehydration: Secondary | ICD-10-CM | POA: Diagnosis present

## 2019-09-15 DIAGNOSIS — N179 Acute kidney failure, unspecified: Secondary | ICD-10-CM | POA: Diagnosis present

## 2019-09-15 DIAGNOSIS — E1140 Type 2 diabetes mellitus with diabetic neuropathy, unspecified: Secondary | ICD-10-CM | POA: Diagnosis present

## 2019-09-15 DIAGNOSIS — M542 Cervicalgia: Secondary | ICD-10-CM | POA: Diagnosis present

## 2019-09-15 DIAGNOSIS — Z20828 Contact with and (suspected) exposure to other viral communicable diseases: Secondary | ICD-10-CM | POA: Diagnosis not present

## 2019-09-15 DIAGNOSIS — M549 Dorsalgia, unspecified: Secondary | ICD-10-CM | POA: Diagnosis present

## 2019-09-15 DIAGNOSIS — G8929 Other chronic pain: Secondary | ICD-10-CM | POA: Diagnosis present

## 2019-09-15 DIAGNOSIS — I350 Nonrheumatic aortic (valve) stenosis: Secondary | ICD-10-CM | POA: Diagnosis present

## 2019-09-15 DIAGNOSIS — I255 Ischemic cardiomyopathy: Secondary | ICD-10-CM | POA: Diagnosis present

## 2019-09-15 DIAGNOSIS — N3000 Acute cystitis without hematuria: Secondary | ICD-10-CM

## 2019-09-15 DIAGNOSIS — N390 Urinary tract infection, site not specified: Secondary | ICD-10-CM | POA: Diagnosis not present

## 2019-09-15 DIAGNOSIS — N183 Chronic kidney disease, stage 3 (moderate): Secondary | ICD-10-CM | POA: Diagnosis present

## 2019-09-15 DIAGNOSIS — Z96642 Presence of left artificial hip joint: Secondary | ICD-10-CM | POA: Diagnosis present

## 2019-09-15 DIAGNOSIS — I5042 Chronic combined systolic (congestive) and diastolic (congestive) heart failure: Secondary | ICD-10-CM | POA: Diagnosis not present

## 2019-09-15 DIAGNOSIS — Z951 Presence of aortocoronary bypass graft: Secondary | ICD-10-CM | POA: Diagnosis not present

## 2019-09-15 DIAGNOSIS — M069 Rheumatoid arthritis, unspecified: Secondary | ICD-10-CM | POA: Diagnosis present

## 2019-09-15 DIAGNOSIS — B965 Pseudomonas (aeruginosa) (mallei) (pseudomallei) as the cause of diseases classified elsewhere: Secondary | ICD-10-CM | POA: Diagnosis present

## 2019-09-15 DIAGNOSIS — E1151 Type 2 diabetes mellitus with diabetic peripheral angiopathy without gangrene: Secondary | ICD-10-CM | POA: Diagnosis present

## 2019-09-15 LAB — URINE CULTURE: Culture: 30000 — AB

## 2019-09-15 LAB — BASIC METABOLIC PANEL
Anion gap: 7 (ref 5–15)
BUN: 31 mg/dL — ABNORMAL HIGH (ref 8–23)
CO2: 26 mmol/L (ref 22–32)
Calcium: 8.2 mg/dL — ABNORMAL LOW (ref 8.9–10.3)
Chloride: 104 mmol/L (ref 98–111)
Creatinine, Ser: 1.95 mg/dL — ABNORMAL HIGH (ref 0.61–1.24)
GFR calc Af Amer: 39 mL/min — ABNORMAL LOW (ref >60)
GFR calc non Af Amer: 34 mL/min — ABNORMAL LOW (ref >60)
Glucose, Bld: 110 mg/dL — ABNORMAL HIGH (ref 70–99)
Potassium: 4.7 mmol/L (ref 3.5–5.1)
Sodium: 137 mmol/L (ref 135–145)

## 2019-09-15 LAB — GLUCOSE, CAPILLARY
Glucose-Capillary: 119 mg/dL — ABNORMAL HIGH (ref 70–99)
Glucose-Capillary: 86 mg/dL (ref 70–99)
Glucose-Capillary: 151 mg/dL — ABNORMAL HIGH (ref 70–99)

## 2019-09-15 MED ORDER — LEVOFLOXACIN 500 MG PO TABS: 500.0000 mg | ORAL_TABLET | Freq: Every day | ORAL | 0 refills | Status: AC

## 2019-09-15 MED ORDER — ENOXAPARIN SODIUM 40 MG/0.4ML ~~LOC~~ SOLN: 40.0000 mg | SUBCUTANEOUS | Status: DC

## 2019-09-15 NOTE — Discharge Summary (Signed)
Discharge Summary  [name] OVF:[id number] DOB: [birth year]  PCP: [name], MD  Admit date: 09/11/2019 Discharge date: 09/15/2019  Time spent: 40 mins   Recommendations for Outpatient Follow-up:  1. PCP in 1 week 2. Nephrology in 1 week 3. Cardiology as scheduled  Discharge Diagnoses:  Active Hospital Problems   Diagnosis Date Noted   ARF (acute renal failure) ([place]) 08/13/2014   Type 2 diabetes mellitus with peripheral vascular disease ([place]) 09/11/2019   Rheumatoid arthritis ([place])    Hypertension     Resolved Hospital Problems  No resolved problems to display.    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  Went for a routine cardiology appointment, and patient's creatinine was found to be increased from baseline, the last known last week was 1.6 it was 3.1 and was advised to come to the ER. Patient takes torsemide but denies taking any NSAIDs, denies nausea vomiting or diarrhea.  In the ED, repeat creatinine was 3.1, UA shows possibility of UTI. Hemoglobin was 8.6 platelets 151 EKG shows paced rhythm. Patient clinically appeared dehydrated.  Patient admitted for further management.   Today, patient denies any new complaints, denies any chest pain/pressure, shortness of breath, fever/chills, abdominal pain, nausea/vomiting.  Hospital Course:  Principal Problem:   ARF (acute renal failure)  (HCC) Active Problems:   Rheumatoid arthritis ([place])   Hypertension   Type 2 diabetes mellitus with peripheral vascular disease (HCC)  Acute on chronic kidney disease stage III Improved Last creatinine PTA was 1.67, on admission 3.19-->2.8-->2.5-->2.14-->1.95 Likely due to contrast-induced nephropathy Renal ultrasound showed negative for hydronephrosis, shadowing calcifications within the kidneys May resume home torsemide, with close follow-up with nephrology/PCP with repeat labs in 1 week Nephrology consulted Advised adequate oral intake, avoid nephrotoxic  Pseudomonas aeruginosa UTI UA with moderate leukocytes, greater than 50 WBC UC growing 30,000 pseudomonas aeruginosa Spoke to Dr. Johnnye Sima from ID, recommended 5 days total of antibiotics, okay to switch to p.o. Levaquin for an additional 2 days S/p cefepime, switch to p.o. Levaquin for a total of 5 days of antibiotics Follow-up with PCP with repeat labs/EKG to monitor for QTC prolongation  Diabetes mellitus type 2 Continue home regimen  Anemia of chronic kidney disease Hemoglobin at baseline  CAD status post CABG/severe aortic stenosis Currently chest pain-free Continue aspirin, statin Patient planned for possible TAVR Follow-up with cardiology  Intermittent complete heart block status post pacemaker placement Stable  History of rheumatoid arthritis Continue steroids, Plaquenil         Malnutrition Type:      Malnutrition Characteristics:      Nutrition Interventions:      Estimated body mass index is 24.91 kg/m as calculated from the following:   Height as of this encounter: '5\' 8"'  (1.727 m).   Weight as of this encounter: 74.3 kg.    Procedures:  None  Consultations:  Nephrology  Discharge Exam: BP [name])  101/40 (BP Location: Left Arm)    Pulse 79    Temp 97.7 F (36.5 C) (Oral)    Resp 18    Ht '5\' 8"'  (1.727 m)    Wt 74.3 kg    SpO2 100%    BMI 24.91 kg/m   General:  NAD Cardiovascular: S1, S2 present Respiratory: CTA B  Discharge Instructions You were cared for by a hospitalist during your hospital stay. If you have any questions about your discharge medications or the care you received while you were in the hospital after you are discharged, you can call the unit and asked to speak with the hospitalist on call if the hospitalist that took care of you is not available. Once you are discharged, your primary care physician will handle any further medical issues. Please note that NO REFILLS for any discharge medications will be authorized once you are discharged, as it is imperative that you return to your primary care physician (or establish a relationship with a primary care physician if you do not have one) for your aftercare needs so that they can reassess your need for medications and monitor your lab values.   Allergies as of 09/15/2019      Reactions   Sulfa Antibiotics Rash      Medication List    TAKE these medications   amitriptyline 50 MG tablet Commonly known as: ELAVIL TAKE 1 AND 1/2 TABLETS BY MOUTH AT BEDTIME. What changed:   how much to take  how to take this  when to take this  additional instructions   aspirin EC 81 MG tablet Take 81 mg by mouth daily.   atorvastatin 80 MG tablet Commonly known as: LIPITOR TAKE ONE TABLET BY MOUTH ONCE DAILY.   blood glucose meter kit and supplies Kit Dispense based on patient and insurance preference. Use up to 3 times daily as directed. DX:E11.9   clonazePAM 0.25 MG disintegrating tablet Commonly known as: KLONOPIN DISSOLVE 1 TABLET BY MOUTH DAILY AS NEEDED FOR ANXIETY. What changed:   how much to take  how to take this  when to take this  reasons to take this  additional instructions   docusate sodium 100 MG capsule Commonly known as: COLACE Take 100 mg by mouth 3 (three) times daily.   DULoxetine 30 MG capsule Commonly known as: CYMBALTA Take 30 mg by mouth at  bedtime.   finasteride 5 MG tablet Commonly known as: PROSCAR Take 5 mg by mouth daily.   hydroxychloroquine 200 MG tablet Commonly known as: PLAQUENIL Take 200 mg by mouth at bedtime.   insulin aspart protamine- aspart (70-30) 100 UNIT/ML injection Commonly known as: NOVOLOG MIX 70/30 Inject 12 units into skin in the morning  and 8 units evening What changed:   how much to take  how to take this  when to take this  additional instructions   levofloxacin 500 MG tablet Commonly known as: LEVAQUIN Take 1 tablet (500 mg total) by mouth daily for 2 days. Start taking on: September 16, 2019   methadone 10 MG tablet Commonly known as: DOLOPHINE Take 10 mg by mouth every 6 (six) hours.   methadone 5 MG tablet Commonly known as: DOLOPHINE Take 5 mg by mouth 3 (three) times daily as needed (for breakthrough pain).   nitroGLYCERIN 0.4 MG SL tablet Commonly known as: NITROSTAT Place 1 tablet (0.4 mg total) under the tongue every 5 (five) minutes x 3 doses as needed for chest pain.   polyethylene glycol powder  17 GM/SCOOP powder Commonly known as: MiraLax Take 17 g by mouth 2 (two) times daily. What changed: when to take this   predniSONE 5 MG tablet Commonly known as: DELTASONE TAKE 1 TABLET BY MOUTH ONCE A DAY WITH BREAKFAST. What changed: See the new instructions.   pregabalin 25 MG capsule Commonly known as: LYRICA TAKE ONE CAPSULE BY MOUTH AT BEDTIME.   REFRESH OP Place 1 drop into both eyes 3 (three) times daily as needed (for dry eyes).   senna 8.6 MG Tabs tablet Commonly known as: SENOKOT Take 1 tablet by mouth 2 (two) times daily.   tamsulosin 0.4 MG Caps capsule Commonly known as: FLOMAX Take 1 capsule (0.4 mg total) by mouth at bedtime.   torsemide 10 MG tablet Commonly known as: DEMADEX TAKE 2 TABLETS BY MOUTH EVERY OTHER DAY. What changed:   how much to take  how to take this  when to take this  additional instructions   Vitamin D3 25 MCG  (1000 UT) Chew Chew 1,000 Units by mouth daily.      Allergies  Allergen Reactions   Sulfa Antibiotics Rash   Follow-up Information    [name], MD. Schedule an appointment as soon as possible for a visit in 1 week(s).   Specialty: Family Medicine Contact information: [place] [address] [phone]        Arnoldo Lenis, MD .   Specialty: Cardiology Contact information: [address] [place] [address] [phone]            The results of significant diagnostics from this hospitalization (including imaging, microbiology, ancillary and laboratory) are listed below for reference.    Significant Diagnostic Studies: Dg Chest 2 View  Result Date: 09/11/2019 CLINICAL DATA:  Chest pain EXAM: CHEST - 2 VIEW COMPARISON:  08/31/2019, 01/21/2019 FINDINGS: Left-sided implanted cardiac device remains unchanged in positioning. Median sternotomy changes. Heart size remains mildly enlarged. Patchy right lung base opacity is persistent from prior. No pleural effusion or pneumothorax. Advanced degenerative changes of the shoulders. IMPRESSION: Chronic right basilar scarring. Otherwise, no acute cardiopulmonary findings. Electronically Signed   By: Davina Poke M.D.   On: 09/11/2019 14:30   Dg Chest 2 View  Result Date: 08/31/2019 CLINICAL DATA:  Pacemaker. Pt cannot lift both arms EXAM: CHEST - 2 VIEW COMPARISON:  08/29/2019 FINDINGS: Patient has new LEFT-sided transvenous pacemaker with leads to the RIGHT atrium and RIGHT ventricle. No pneumothorax. Heart size is mildly enlarged. Status post median sternotomy and CABG. Patchy opacity at the RIGHT lung base appear stable. No pulmonary edema. Visualized bowel gas pattern is nonobstructed. Chronic changes in both shoulders. IMPRESSION: Interval placement of LEFT-sided transvenous pacemaker. No pneumothorax. Electronically Signed   By: Nolon Nations M.D.   On: 08/31/2019 08:54   US  Renal  Result Date: 09/11/2019 CLINICAL DATA:  Chronic kidney disease EXAM: RENAL / URINARY TRACT ULTRASOUND COMPLETE COMPARISON:  07/01/2019, CT 10/15/2016 FINDINGS: Right Kidney: Renal measurements: 10 x 5.4 x 5.5 cm = volume: 156.8 mL. Cortical echogenicity within normal limits. No hydronephrosis. Shadowing calcification at the lower pole. Mild renal cortical thinning. Left Kidney: Renal measurements: 10 x 5 x 4.7 cm = volume: 124.6 mL. Cortical echogenicity within normal limits. No hydronephrosis. Shadowing calcification at the midpole. Mild cortical thinning. Bladder: Appears normal for degree of bladder distention. IMPRESSION: 1. Negative for hydronephrosis. 2. Shadowing calcifications within the kidneys, uncertain if this is due to vascular calcification or kidney stones. Electronically  Signed   By: Donavan Foil M.D.   On: 09/11/2019 23:52   Dg Chest Portable 1 View  Result Date: 08/29/2019 CLINICAL DATA:  Chest pain. EXAM: PORTABLE CHEST 1 VIEW COMPARISON:  July 01, 2019 FINDINGS: External pacing pads are in place. Prior postsurgical changes from CABG. The cardiac silhouette is enlarged. Low lung volumes. Right lower lobe atelectasis versus airspace consolidation. Small right pleural effusion. Osteoarthritic changes of bilateral shoulders. IMPRESSION: Low lung volumes with right lower lobe atelectasis versus airspace consolidation and small right pleural effusion. Apparent enlargement of the cardiac silhouette. Electronically Signed   By: Fidela Salisbury M.D.   On: 08/29/2019 16:11    Microbiology: Recent Results (from the past 240 hour(s))  SARS CORONAVIRUS 2 (TAT 6-24 HRS) Nasopharyngeal Nasopharyngeal Swab     Status: None   Collection Time: 09/11/19  9:18 PM   Specimen: Nasopharyngeal Swab  Result Value Ref Range Status   SARS Coronavirus 2 NEGATIVE NEGATIVE Final    Comment: (NOTE) SARS-CoV-2 target nucleic acids are NOT DETECTED. The SARS-CoV-2 RNA is generally detectable in upper  and lower respiratory specimens during the acute phase of infection. Negative results do not preclude SARS-CoV-2 infection, do not rule out co-infections with other pathogens, and should not be used as the sole basis for treatment or other patient management decisions. Negative results must be combined with clinical observations, patient history, and epidemiological information. The expected result is Negative. Fact Sheet for Patients: SugarRoll.be Fact Sheet for Healthcare Providers: https://www.woods-mathews.com/ This test is not yet approved or cleared by the [place] FDA and  has been authorized for detection and/or diagnosis of SARS-CoV-2 by FDA under an Emergency Use Authorization (EUA). This EUA will remain  in effect (meaning this test can be used) for the duration of the COVID-19 declaration under Section 56 4(b)(1) of the Act, 21 U.S.C. section 360bbb-3(b)(1), unless the authorization is terminated or revoked sooner. Performed at [facility], [place] [address] [place], [place] [address]   Culture, Urine     Status: Abnormal   Collection Time: 09/12/19  7:45 AM   Specimen: Urine, Random  Result Value Ref Range Status   Specimen Description URINE, RANDOM  Final   Special Requests   Final    NONE Performed at [facility], [employer] [address] [place], [place] [address]    Culture 30,000 COLONIES/mL PSEUDOMONAS AERUGINOSA (A)  Final   Report Status 09/15/2019 FINAL  Final   Organism ID, Bacteria PSEUDOMONAS AERUGINOSA (A)  Final      Susceptibility   Pseudomonas aeruginosa - MIC*    CEFTAZIDIME 4 SENSITIVE Sensitive     CIPROFLOXACIN 0.5 SENSITIVE Sensitive     GENTAMICIN 4 SENSITIVE Sensitive     IMIPENEM 2 SENSITIVE Sensitive     PIP/TAZO <=4 SENSITIVE Sensitive     CEFEPIME <=1 SENSITIVE Sensitive     * 30,000 COLONIES/mL PSEUDOMONAS AERUGINOSA     Labs: Basic Metabolic Panel: Recent Labs  Lab 09/11/19 1402  09/11/19 2057 09/12/19 0944 09/12/19 1255 09/13/19 0512 09/14/19 0524 09/15/19 0655  NA 135  --   --  138 138 138 137  K 4.7  --   --  4.1 4.3 4.6 4.7  CL 100  --   --  101 104 104 104  CO2 24  --   --  '27 25 25 26  ' GLUCOSE 243*  --   --  92 119* 180* 110*  BUN 39*  --   --  36* 33* 30* 31*  CREATININE 3.12*  --  2.93* 2.80* 2.55* 2.14* 1.95*  CALCIUM 9.2  --   --  8.8* 8.5* 8.4* 8.2*  MG  --  2.3  --   --   --   --   --   PHOS  --  5.2*  --   --   --   --   --    Liver Function Tests: No results for input(s): AST, ALT, ALKPHOS, BILITOT, PROT, ALBUMIN in the last 168 hours. No results for input(s): LIPASE, AMYLASE in the last 168 hours. No results for input(s): AMMONIA in the last 168 hours. CBC: Recent Labs  Lab 09/10/19 1218 09/11/19 1402 09/12/19 0228 09/13/19 0512 09/14/19 0524  WBC 8.0 8.3 6.3 6.0 6.7  NEUTROABS  --   --   --  3.1 3.5  HGB 8.7* 8.6* 8.6* 7.6* 8.1*  HCT 25.8* 28.2* 26.5* 24.4* 25.2*  MCV 91 100.0 97.1 98.4 98.8  PLT 158 151 141* 134* 132*   Cardiac Enzymes: No results for input(s): CKTOTAL, CKMB, CKMBINDEX, TROPONINI in the last 168 hours. BNP: BNP (last 3 results) Recent Labs    07/01/19 1639 09/11/19 2013  BNP 3,872.1* 1,766.3*    ProBNP (last 3 results) Recent Labs    09/10/19 1218  PROBNP 15,761*    CBG: Recent Labs  Lab 09/14/19 1558 09/14/19 1655 09/14/19 2109 09/15/19 0612 09/15/19 1139  GLUCAP 136* 107* 111* 119* 86       Signed:  Alma Friendly, MD [facility] 09/15/2019, 3:51 PM

## 2019-09-15 NOTE — Plan of Care (Signed)

## 2019-09-15 NOTE — Progress Notes (Signed)
Discharge instructions given to patient and spouse they verbalized understanding of instructions given voiced no concerns. Patient aware has UTI has 2 more doses of levofloxcin complete antibiotic coarse. PIV removed withiout diffulculty. Tele removed and CCMD informed. Wife dressing patient getting his ready for discharge. No further changes noted.

## 2019-09-15 NOTE — Plan of Care (Signed)
Problem: Education: Goal: Ability to demonstrate management of disease process will improve 09/15/2019 1703 by Arlina Robes, RN Outcome: Adequate for Discharge 09/15/2019 1652 by Arlina Robes, RN Outcome: Adequate for Discharge Goal: Ability to verbalize understanding of medication therapies will improve 09/15/2019 1703 by Arlina Robes, RN Outcome: Adequate for Discharge 09/15/2019 1652 by Arlina Robes, RN Outcome: Adequate for Discharge Goal: Individualized Educational Video(s) 09/15/2019 1703 by Arlina Robes, RN Outcome: Adequate for Discharge 09/15/2019 1652 by Arlina Robes, RN Outcome: Adequate for Discharge   Problem: Activity: Goal: Capacity to carry out activities will improve 09/15/2019 1703 by Arlina Robes, RN Outcome: Adequate for Discharge 09/15/2019 1652 by Arlina Robes, RN Outcome: Adequate for Discharge   Problem: Cardiac: Goal: Ability to achieve and maintain adequate cardiopulmonary perfusion will improve 09/15/2019 1703 by Arlina Robes, RN Outcome: Adequate for Discharge 09/15/2019 1652 by Arlina Robes, RN Outcome: Adequate for Discharge   Problem: Education: Goal: Knowledge of General Education information will improve Description: Including pain rating scale, medication(s)/side effects and non-pharmacologic comfort measures 09/15/2019 1703 by Arlina Robes, RN Outcome: Adequate for Discharge 09/15/2019 1652 by Arlina Robes, RN Outcome: Adequate for Discharge   Problem: Health Behavior/Discharge Planning: Goal: Ability to manage health-related needs will improve 09/15/2019 1703 by Arlina Robes, RN Outcome: Adequate for Discharge 09/15/2019 1652 by Arlina Robes, RN Outcome: Adequate for Discharge   Problem: Clinical Measurements: Goal: Ability to maintain clinical measurements within normal limits will improve 09/15/2019 1703 by Arlina Robes, RN Outcome: Adequate for  Discharge 09/15/2019 1652 by Arlina Robes, RN Outcome: Adequate for Discharge Goal: Will remain free from infection 09/15/2019 1703 by Arlina Robes, RN Outcome: Adequate for Discharge 09/15/2019 1652 by Arlina Robes, RN Outcome: Adequate for Discharge Goal: Diagnostic test results will improve 09/15/2019 1703 by Arlina Robes, RN Outcome: Adequate for Discharge 09/15/2019 1652 by Arlina Robes, RN Outcome: Adequate for Discharge Goal: Respiratory complications will improve 09/15/2019 1703 by Arlina Robes, RN Outcome: Adequate for Discharge 09/15/2019 1652 by Arlina Robes, RN Outcome: Adequate for Discharge Goal: Cardiovascular complication will be avoided 09/15/2019 1703 by Arlina Robes, RN Outcome: Adequate for Discharge 09/15/2019 1652 by Arlina Robes, RN Outcome: Adequate for Discharge   Problem: Activity: Goal: Risk for activity intolerance will decrease 09/15/2019 1703 by Arlina Robes, RN Outcome: Adequate for Discharge 09/15/2019 1652 by Arlina Robes, RN Outcome: Adequate for Discharge   Problem: Nutrition: Goal: Adequate nutrition will be maintained 09/15/2019 1703 by Arlina Robes, RN Outcome: Adequate for Discharge 09/15/2019 1652 by Arlina Robes, RN Outcome: Adequate for Discharge   Problem: Coping: Goal: Level of anxiety will decrease 09/15/2019 1703 by Arlina Robes, RN Outcome: Adequate for Discharge 09/15/2019 1652 by Arlina Robes, RN Outcome: Adequate for Discharge   Problem: Elimination: Goal: Will not experience complications related to bowel motility 09/15/2019 1703 by Arlina Robes, RN Outcome: Adequate for Discharge 09/15/2019 1652 by Arlina Robes, RN Outcome: Adequate for Discharge Goal: Will not experience complications related to urinary retention 09/15/2019 1703 by Arlina Robes, RN Outcome: Adequate for Discharge 09/15/2019 1652 by Arlina Robes,  RN Outcome: Adequate for Discharge   Problem: Pain Managment: Goal: General experience of comfort will improve 09/15/2019 1703 by Arlina Robes, RN Outcome: Adequate for Discharge 09/15/2019 1652 by Arlina Robes, RN Outcome: Adequate for Discharge   Problem: Safety: Goal: Ability to remain  free from injury will improve 09/15/2019 1703 by Arlina Robes, RN Outcome: Adequate for Discharge 09/15/2019 1652 by Arlina Robes, RN Outcome: Adequate for Discharge   Problem: Skin Integrity: Goal: Risk for impaired skin integrity will decrease 09/15/2019 1703 by Arlina Robes, RN Outcome: Adequate for Discharge 09/15/2019 1652 by Arlina Robes, RN Outcome: Adequate for Discharge

## 2019-09-16 ENCOUNTER — Encounter (HOSPITAL_BASED_OUTPATIENT_CLINIC_OR_DEPARTMENT_OTHER): Payer: Medicare HMO

## 2019-09-24 ENCOUNTER — Institutional Professional Consult (permissible substitution): Payer: Medicare HMO | Admitting: Critical Care Medicine

## 2019-09-25 ENCOUNTER — Other Ambulatory Visit: Payer: Self-pay

## 2019-09-25 ENCOUNTER — Ambulatory Visit: Payer: Medicare HMO | Admitting: Critical Care Medicine

## 2019-09-25 ENCOUNTER — Encounter: Payer: Self-pay | Admitting: Critical Care Medicine

## 2019-09-25 VITALS — BP 118/76 | HR 82 | Temp 98.4°F | Ht 68.0 in

## 2019-09-25 DIAGNOSIS — R05 Cough: Secondary | ICD-10-CM

## 2019-09-25 DIAGNOSIS — I350 Nonrheumatic aortic (valve) stenosis: Secondary | ICD-10-CM | POA: Diagnosis not present

## 2019-09-25 DIAGNOSIS — R9389 Abnormal findings on diagnostic imaging of other specified body structures: Secondary | ICD-10-CM | POA: Diagnosis not present

## 2019-09-25 DIAGNOSIS — R058 Other specified cough: Secondary | ICD-10-CM

## 2019-09-25 NOTE — Progress Notes (Signed)
Synopsis: Referred in September 2020 for productive cough by Ledora Bottcher, PA.  Subjective:   PATIENT ID: [name] GENDER: male DOB: November 28, 1951, MRN: [id number]  Chief Complaint  Patient presents with   Consult    Referred by PCP for chronic cough. Productive cough with grayish/greenish phlegm. Has been going for the past 2 years.      [name] is a 68 year old gentleman with a history of severe coronary artery disease requiring four-vessel CABG in 2016, HFpEF, severe aortic stenosis, CKD, and rheumatoid arthritis currently on prednisone 30m and hydroxychloroquine who presents for evaluation of chronic cough with sputum production for past 2 years.  This started at a random time, not associated with an infection.  It has not worsened over time.  His sputum is usually gray, but sometimes green.  He expectorates sputum throughout the day, sometimes 1-2 times a day, sometimes 4 or more times per day.  He has had pneumonias in the past, but  no significant childhood lung infections.  He has no previous personal or family history of lung disease.  He has no history of cancer; he is a never smoker.  Although his rheumatoid arthritis regimen is minimal, in the past he has been on Remicade, methotrexate, and possibly other medications for more aggressive control.  He has had infectious complications in the past, limiting his ability to tolerate more immunosuppression.  He has chronic hoarseness, which he reports [place] was told he had "weak vocal cords".  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  Otherwise his health is at baseline.  He is able to ambulate with a left lower extremity prosthesis, and he has a motorized wheelchair.  His wife assists him due to disfigurement from rheumatoid  arthritis.      Past Medical History:  Diagnosis Date   Aortic stenosis    ARF (acute renal failure) ([place] 09/11/2019   Chronic back pain    Chronic combined systolic (congestive) and diastolic (congestive) heart failure ([place]    a. EF 30-35% in 2016 b. at 30-35% by echo in 08/2016 c. 06/2019: echo showing EF of 60-65% but found to have severe AS.    Chronic neck pain    CKD (chronic kidney disease), stage III ([place]    Complete heart block ([facility] 08/29/2019   a. s/p Medtronic dual chamber pacemeker implantation on 08/30/2019.   Coronary artery disease    a. s/p CABG in 06/2015 with LIMA-LAD, SVG-PDA, SVG-Intermediate   Diabetes mellitus    Diabetic neuropathy (HCC)    Hyperkalemia    Hypertension    Loculated pleural effusion 09/09/2016   Pancytopenia ([facility] 08/13/2014   Prolonged QT interval 08/14/2014   Possibly secondary to methadone and amitriptyline.   Rheumatoid arthritis(714.0)    Seizure ([place] 08/13/2014   Pt denies     Family History  Problem Relation Age of Onset   Diabetes Father    Alzheimer's disease Father    Heart attack Brother        multiple brothers with MIs   COPD Brother    Hypertension Mother    Stroke Mother    Head & neck cancer Nephew      Past Surgical History:  Procedure Laterality Date   BELOW KNEE LEG AMPUTATION Left 06/216  left leg   CARDIAC SURGERY     FRACTURE SURGERY     MUSCLE BIOPSY  06/2016   PACEMAKER IMPLANT N/A 08/30/2019   Procedure: PACEMAKER IMPLANT;  Surgeon: Evans Lance, MD;  Location: [facility];  Service: Cardiovascular;  Laterality: N/A;   right foot surgery-Toe amputations     RIGHT/LEFT HEART CATH AND CORONARY/GRAFT ANGIOGRAPHY N/A 09/03/2019   Procedure: RIGHT/LEFT HEART CATH AND CORONARY/GRAFT ANGIOGRAPHY;  Surgeon: Leonie Man, MD;  Location: [facility];  Service: Cardiovascular;  Laterality: N/A;   TEMPORARY PACEMAKER N/A 08/29/2019   Procedure: TEMPORARY  PACEMAKER;  Surgeon: Sherren Mocha, MD;  Location: [facility];  Service: Cardiovascular;  Laterality: N/A;   TOTAL HIP ARTHROPLASTY Left     Social History   Socioeconomic History   Marital status: Married    Spouse name: Not on file   Number of children: 2   Years of education: Not on file   Highest education level: Not on file  Occupational History   Occupation: Retired Programme researcher, broadcasting/film/video strain: Not on file   Food insecurity    Worry: Not on file    Inability: Not on file   Transportation needs    Medical: Not on file    Non-medical: Not on file  Tobacco Use   Smoking status: Never Smoker   Smokeless tobacco: Never Used  Substance and Sexual Activity   Alcohol use: No   Drug use: No   Sexual activity: Not on file  Lifestyle   Physical activity    Days per week: Not on file    Minutes per session: Not on file   Stress: Not on file  Relationships   Social connections    Talks on phone: Not on file    Gets together: Not on file    Attends religious service: Not on file    Active member of club or organization: Not on file    Attends meetings of clubs or organizations: Not on file    Relationship status: Not on file   Intimate partner violence    Fear of current or ex partner: Not on file    Emotionally abused: Not on file    Physically abused: Not on file    Forced sexual activity: Not on file  Other Topics Concern   Not on file  Social History Narrative   Not on file     Allergies  Allergen Reactions   Sulfa Antibiotics Rash     Immunization History  Administered Date(s) Administered   Fluad Quad(high Dose 65+) 09/04/2019   Influenza,inj,Quad PF,6+ Mos 11/20/2017   Influenza-Unspecified 09/29/2016, 10/16/2018, 09/04/2019   Pneumococcal Polysaccharide-23 09/04/2019   Pneumococcal-Unspecified 10/03/2016    Outpatient Medications Prior to Visit  Medication Sig Dispense Refill    amitriptyline (ELAVIL) 50 MG tablet TAKE 1 AND 1/2 TABLETS BY MOUTH AT BEDTIME. (Patient taking differently: Take 75 mg by mouth at bedtime. ) 45 tablet 5   aspirin EC 81 MG tablet Take 81 mg by mouth daily.     atorvastatin (LIPITOR) 80 MG tablet TAKE ONE TABLET BY MOUTH ONCE DAILY. (Patient taking differently: Take 80 mg by mouth daily. ) 30 tablet 5   blood glucose meter kit and supplies KIT Dispense based on patient and insurance preference. Use up to 3 times daily as directed. DX:E11.9 1 each 0   Cholecalciferol (VITAMIN D3) 25 MCG (1000 UT) CHEW Chew 1,000 Units by mouth  daily.      clonazePAM (KLONOPIN) 0.25 MG disintegrating tablet DISSOLVE 1 TABLET BY MOUTH DAILY AS NEEDED FOR ANXIETY. (Patient taking differently: Take 0.25 mg by mouth daily as needed (for anxiety- dissolve in the mouth). ) 20 tablet 1   docusate sodium (COLACE) 100 MG capsule Take 100 mg by mouth 3 (three) times daily.      DULoxetine (CYMBALTA) 30 MG capsule Take 30 mg by mouth at bedtime.      finasteride (PROSCAR) 5 MG tablet Take 5 mg by mouth daily.      hydroxychloroquine (PLAQUENIL) 200 MG tablet Take 200 mg by mouth at bedtime.      insulin aspart protamine- aspart (NOVOLOG MIX 70/30) (70-30) 100 UNIT/ML injection Inject 12 units into skin in the morning  and 8 units evening (Patient taking differently: Inject 8-12 Units into the skin See admin instructions. Inject 12 units into the skin in the morning and 8 units in the evening) 10 mL 5   methadone (DOLOPHINE) 10 MG tablet Take 10 mg by mouth every 6 (six) hours.      methadone (DOLOPHINE) 5 MG tablet Take 5 mg by mouth 3 (three) times daily as needed (for breakthrough pain).      nitroGLYCERIN (NITROSTAT) 0.4 MG SL tablet Place 1 tablet (0.4 mg total) under the tongue every 5 (five) minutes x 3 doses as needed for chest pain. 25 tablet 3   polyethylene glycol powder (MIRALAX) powder Take 17 g by mouth 2 (two) times daily. (Patient taking differently:  Take 17 g by mouth at bedtime. ) 255 g    Polyvinyl Alcohol-Povidone (REFRESH OP) Place 1 drop into both eyes 3 (three) times daily as needed (for dry eyes).      predniSONE (DELTASONE) 5 MG tablet TAKE 1 TABLET BY MOUTH ONCE A DAY WITH BREAKFAST. (Patient taking differently: Take 5 mg by mouth daily with breakfast. ) 30 tablet 5   pregabalin (LYRICA) 25 MG capsule TAKE ONE CAPSULE BY MOUTH AT BEDTIME. (Patient taking differently: Take 25 mg by mouth at bedtime. ) 30 capsule 5   senna (SENOKOT) 8.6 MG TABS tablet Take 1 tablet by mouth 2 (two) times daily.     tamsulosin (FLOMAX) 0.4 MG CAPS capsule Take 1 capsule (0.4 mg total) by mouth at bedtime. 30 capsule 5   torsemide (DEMADEX) 10 MG tablet TAKE 2 TABLETS BY MOUTH EVERY OTHER DAY. (Patient taking differently: Take 20 mg by mouth every other day. ) 30 tablet 5   No facility-administered medications prior to visit.     Review of Systems  Constitutional: Negative for chills, fever, malaise/fatigue and weight loss.  HENT: Negative for congestion and sore throat.        Chronic hoarseness  Eyes: Negative.   Respiratory: Positive for cough and sputum production. Negative for hemoptysis, shortness of breath and wheezing.   Cardiovascular: Positive for leg swelling. Negative for chest pain and palpitations.  Gastrointestinal: Negative for diarrhea, heartburn and nausea.  Genitourinary: Negative.   Musculoskeletal:       Chronic synovitis and pain related to rheumatoid arthritis  Skin: Negative for rash.  Neurological: Negative for dizziness and focal weakness.  Endo/Heme/Allergies: Does not bruise/bleed easily.  Psychiatric/Behavioral: Negative.      Objective:   Vitals:   09/25/19 1529  BP: 118/76  Pulse: 82  Temp: 98.4 F (36.9 C)  TempSrc: Temporal  SpO2: 98%  Height: '5\' 8"'  (1.727 m)   98% on   RA BMI Readings  from Last 3 Encounters:  09/25/19 24.91 kg/m  09/15/19 24.91 kg/m  09/10/19 27.41 kg/m   Wt Readings  from Last 3 Encounters:  09/15/19 163 lb 12.8 oz (74.3 kg)  09/10/19 175 lb (79.4 kg)  09/04/19 163 lb 2.3 oz (74 kg)    Physical Exam Vitals signs reviewed.  Constitutional:      Comments: Sitting in motorized wheelchair  HENT:     Head: Normocephalic and atraumatic.     Nose:     Comments: Deferred due to masking requirement.    Mouth/Throat:     Comments: Deferred due to masking requirement. Hoarse voice Eyes:     General: No scleral icterus.    Conjunctiva/sclera: Conjunctivae normal.  Neck:     Musculoskeletal: Neck supple.  Cardiovascular:     Rate and Rhythm: Normal rate and regular rhythm.     Comments: 3/ 6 systolic murmur throughout the precordium Pulmonary:     Comments: Breathing comfortably on room air.  No conversational dyspnea or tachypnea.  Hoarseness.  Decreased right basilar breath sounds, no wheezing or rales.  No witnessed coughing. Abdominal:     General: There is no distension.     Palpations: Abdomen is soft.     Tenderness: There is no abdominal tenderness.  Musculoskeletal:        General: Swelling present.     Comments: Left below the knee amputation, no prosthesis.  Ulnar deviation, MCP contractures, and significant disfigurement from rheumatoid arthritis.  Lymphadenopathy:     Cervical: No cervical adenopathy.  Skin:    General: Skin is warm and dry.     Findings: No rash.  Neurological:     Mental Status: He is alert. Mental status is at baseline.     Motor: Weakness present.     Coordination: Coordination normal.     Comments: Answering questions appropriately, normal speech.  Face symmetric, moving all extremities spontaneously.  Psychiatric:        Mood and Affect: Mood normal.        Behavior: Behavior normal.      CBC    Component Value Date/Time   WBC 6.7 09/14/2019 0524   RBC 2.55 (L) 09/14/2019 0524   HGB 8.1 (L) 09/14/2019 0524   HGB 8.7 (L) 09/10/2019 1218   HCT 25.2 (L) 09/14/2019 0524   HCT 25.8 (L) 09/10/2019 1218    PLT 132 (L) 09/14/2019 0524   PLT 158 09/10/2019 1218   MCV 98.8 09/14/2019 0524   MCV 91 09/10/2019 1218   MCH 31.8 09/14/2019 0524   MCHC 32.1 09/14/2019 0524   RDW 14.7 09/14/2019 0524   RDW 13.2 09/10/2019 1218   LYMPHSABS 2.1 09/14/2019 0524   MONOABS 0.6 09/14/2019 0524   EOSABS 0.5 09/14/2019 0524   BASOSABS 0.1 09/14/2019 0524    CHEMISTRY No results for input(s): NA, K, CL, CO2, GLUCOSE, BUN, CREATININE, CALCIUM, MG, PHOS in the last 168 hours. Estimated Creatinine Clearance: 34.1 mL/min (A) (by C-G formula based on SCr of 1.95 mg/dL (H)).  ABG 09/03/2019:  7.36/ 52/ 196/ 29, 100% saturation  Chest Imaging- films reviewed: CXR 08/31/2019, 2 view- low lung volumes.  Right lower lobe opacity with silhouetting of the entire hemidiaphragm.  No obvious pleural effusion.  AICD in place.  Calcified vasculature.  Right lower lobe opacity has been present since July 2020; new since January 2020.  CT chest 10/16/2016- no obvious mediastinal or hilar lymphadenopathy.  Patulous esophagus.  Significant vascular calcifications.  Subpleural pulmonary nodules.  Subpleural scarring in the posterior lateral right lung.  Scarring in bilateral lower lobes with pleural tags.  Consolidation in a bronchovascular pattern in the right lower lobe with air bronchograms.  Elevated right hemidiaphragm.  Pulmonary Functions Testing Results: No flowsheet data found.    Echocardiogram 07/02/2019: LVEF 60 to 23% with diastolic dysfunction.  Normal left atrium.  Normal RV and RA.  Severe aortic stenosis, mild MR, mild TR. AVA 0.92 cm  Heart Catheterization 08/29/2019:  Fick Cardiac Output 6.47 L/min  Fick Cardiac Output Index 3.43 (L/min)/BSA  Aortic Mean Gradient 30.95 mmHg  Aortic Peak Gradient 38 mmHg  Aortic Valve Area 1.14  Aortic Value Area Index 0.6 cm2/BSA  RA A Wave 3 mmHg  RA V Wave 2 mmHg  RA Mean 0 mmHg  RV Systolic Pressure 20 mmHg  RV Diastolic Pressure -2 mmHg  RV EDP 1 mmHg  PA Systolic  Pressure 23 mmHg  PA Diastolic Pressure 3 mmHg  PA Mean 11 mmHg  PW A Wave 3 mmHg  PW V Wave 6 mmHg  PW Mean 2 mmHg  AO Systolic Pressure 361 mmHg  AO Diastolic Pressure 51 mmHg  AO Mean 73 mmHg  LV Systolic Pressure 224 mmHg  LV Diastolic Pressure 6 mmHg  LV EDP 9 mmHg  AOp Systolic Pressure 497 mmHg  AOp Diastolic Pressure 46 mmHg  AOp Mean Pressure 73 mmHg  LVp Systolic Pressure 530 mmHg  LVp Diastolic Pressure 8 mmHg  LVp EDP Pressure 16 mmHg  QP/QS 1  TPVR Index 3.21 HRUI  TSVR Index 21.27 HRUI  TPVR/TSVR Ratio 0.15   Heart catheterization 09/03/2019   Severe 4-vessel native CAD with 100% flush CTO of the LAD, RI and RCA with 60% stenosis of ostial OM1 and ostial PDA (fill via SVG-PDA)  Widely patent LIMA-LAD, SVG-RI with moderate ostial and proximal disease in SVG-RPDA (retrograde flow from RPDA 2 RPL system somewhat jeopardized by ostial 75% PDA lesion-not PCI target)  Echocardiographically Severe Aortic Stenosis -> with direct measurements of P-P pressure 38 million mercury and mean 31 mmHg.  Normal CARDIAC OUTPUT & CARDIAC INDEX  Relatively normal RIGHT HEART CATH pressures    Assessment & Plan:     ICD-10-CM   1. Cough with sputum  R05 CT Chest Wo Contrast    Respiratory or Resp and Sputum Culture    Fungus Culture with Smear     MYCOBACTERIA, CULTURE, WITH FLUOROCHROME SMEAR  2. Aortic valve stenosis, etiology of cardiac valve disease unspecified  I35.0   3. Abnormal CXR (chest x-ray)  R93.89 CT Chest Wo Contrast    Respiratory or Resp and Sputum Culture    Fungus Culture with Smear     MYCOBACTERIA, CULTURE, WITH FLUOROCHROME SMEAR     Chronic productive cough with persistent right lower lobe opacity on CXR; concern for chronic infection, focal bronchiectasis, or malignancy.  No evidence of bronchiectasis on 2017CT, but this may have resulted from a subsequently from a pulmonary infection. 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   Current Outpatient Medications:    amitriptyline (ELAVIL) 50 MG tablet, TAKE 1 AND 1/2 TABLETS BY MOUTH AT  BEDTIME. (Patient taking differently: Take 75 mg by mouth at bedtime. ), Disp: 45 tablet, Rfl: 5   aspirin EC 81 MG tablet, Take 81 mg by mouth daily., Disp: , Rfl:    atorvastatin (LIPITOR) 80 MG tablet, TAKE ONE TABLET BY MOUTH ONCE DAILY. (Patient taking differently: Take 80 mg by mouth daily. ), Disp: 30 tablet, Rfl: 5   blood glucose meter kit and supplies KIT, Dispense based on patient and insurance preference. Use up to 3 times daily as directed. DX:E11.9, Disp: 1 each, Rfl: 0   Cholecalciferol (VITAMIN D3) 25 MCG (1000 UT) CHEW, Chew 1,000 Units by mouth daily. , Disp: , Rfl:    clonazePAM (KLONOPIN) 0.25 MG disintegrating tablet, DISSOLVE 1 TABLET BY MOUTH DAILY AS NEEDED FOR ANXIETY. (Patient taking differently: Take 0.25 mg by mouth daily as needed (for anxiety- dissolve in the mouth). ), Disp: 20 tablet, Rfl: 1   docusate sodium (COLACE) 100 MG capsule, Take 100 mg by mouth 3 (three) times daily. , Disp: , Rfl:    DULoxetine (CYMBALTA) 30 MG capsule, Take 30 mg by mouth at bedtime. , Disp: , Rfl:    finasteride (PROSCAR) 5 MG tablet, Take 5 mg by mouth daily. , Disp: , Rfl:    hydroxychloroquine (PLAQUENIL) 200 MG tablet, Take 200 mg by mouth at bedtime. , Disp: , Rfl:    insulin aspart protamine- aspart (NOVOLOG MIX 70/30) (70-30) 100 UNIT/ML injection, Inject 12 units into skin in the morning  and 8 units evening (Patient taking differently: Inject 8-12 Units into the skin See admin instructions. Inject 12 units into the  skin in the morning and 8 units in the evening), Disp: 10 mL, Rfl: 5   methadone (DOLOPHINE) 10 MG tablet, Take 10 mg by mouth every 6 (six) hours. , Disp: , Rfl:    methadone (DOLOPHINE) 5 MG tablet, Take 5 mg by mouth 3 (three) times daily as needed (for breakthrough pain). , Disp: , Rfl:    nitroGLYCERIN (NITROSTAT) 0.4 MG SL tablet, Place 1 tablet (0.4 mg total) under the tongue every 5 (five) minutes x 3 doses as needed for chest pain., Disp: 25 tablet, Rfl: 3   polyethylene glycol powder (MIRALAX) powder, Take 17 g by mouth 2 (two) times daily. (Patient taking differently: Take 17 g by mouth at bedtime. ), Disp: 255 g, Rfl:    Polyvinyl Alcohol-Povidone (REFRESH OP), Place 1 drop into both eyes 3 (three) times daily as needed (for dry eyes). , Disp: , Rfl:    predniSONE (DELTASONE) 5 MG tablet, TAKE 1 TABLET BY MOUTH ONCE A DAY WITH BREAKFAST. (Patient taking differently: Take 5 mg by mouth daily with breakfast. ), Disp: 30 tablet, Rfl: 5   pregabalin (LYRICA) 25 MG capsule, TAKE ONE CAPSULE BY MOUTH AT BEDTIME. (Patient taking differently: Take 25 mg by mouth at bedtime. ), Disp: 30 capsule, Rfl: 5   senna (SENOKOT) 8.6 MG TABS tablet, Take 1 tablet by mouth 2 (two) times daily., Disp: , Rfl:    tamsulosin (FLOMAX) 0.4 MG CAPS capsule, Take 1 capsule (0.4 mg total) by mouth at bedtime., Disp: 30 capsule, Rfl: 5   torsemide (DEMADEX) 10 MG tablet, TAKE 2 TABLETS BY MOUTH EVERY OTHER DAY. (Patient taking differently: Take 20 mg by mouth every other day. ), Disp: 30 tablet, Rfl: 5   Julian Hy, DO [facility] 09/25/2019 4:48 PM

## 2019-09-25 NOTE — Patient Instructions (Signed)
Thank you for visiting Dr. Carlis Abbott at [facility]. We recommend the following: Orders Placed This Encounter  Procedures  . Respiratory or Resp and Sputum Culture  . Fungus Culture with Smear  .  MYCOBACTERIA, CULTURE, WITH FLUOROCHROME SMEAR  . CT Chest Wo Contrast   Orders Placed This Encounter  Procedures  . Respiratory or Resp and Sputum Culture    Standing Status:   Future    Standing Expiration Date:   03/24/2021  . Fungus Culture with Smear    Standing Status:   Future    Standing Expiration Date:   09/24/2020  .  MYCOBACTERIA, CULTURE, WITH FLUOROCHROME SMEAR    Standing Status:   Future    Standing Expiration Date:   03/24/2021  . CT Chest Wo Contrast    Standing Status:   Future    Standing Expiration Date:   11/24/2020    Scheduling Instructions:     Within 2 to 3 weeks please    Order Specific Question:   ** REASON FOR EXAM (FREE TEXT)    Answer:   evaluation of persistent RLL opacity    Order Specific Question:   Preferred imaging location?    Answer:   [facility]    Order Specific Question:   Radiology Contrast Protocol - do NOT remove file path    Answer:   [email or web address]    No orders of the defined types were placed in this encounter.   Return in about 4 weeks (around 10/23/2019).    Please do your part to reduce the spread of COVID-19.

## 2019-09-25 NOTE — Progress Notes (Signed)
   Subjective:    Patient ID: [name], male    DOB: 07/15/1951, 68 y.o.   MRN: [id number]  HPI    Review of Systems  Constitutional: Negative for fever and unexpected weight change.  HENT: Negative for congestion, dental problem, ear pain, nosebleeds, postnasal drip, rhinorrhea, sinus pressure, sneezing, sore throat and trouble swallowing.   Eyes: Negative for redness and itching.  Respiratory: Positive for cough and shortness of breath. Negative for chest tightness and wheezing.   Cardiovascular: Negative for palpitations and leg swelling.  Gastrointestinal: Negative for nausea and vomiting.  Genitourinary: Negative for dysuria.  Musculoskeletal: Negative for joint swelling.  Skin: Negative for rash.  Allergic/Immunologic: Negative.  Negative for environmental allergies, food allergies and immunocompromised state.  Neurological: Negative for headaches.  Hematological: Does not bruise/bleed easily.  Psychiatric/Behavioral: Negative for dysphoric mood. The patient is not nervous/anxious.        Objective:   Physical Exam        Assessment & Plan:

## 2019-10-08 ENCOUNTER — Other Ambulatory Visit (HOSPITAL_COMMUNITY)
Admission: RE | Admit: 2019-10-08 | Discharge: 2019-10-08 | Disposition: A | Discharge status: Home / Self Care | Payer: Medicare HMO | Source: Ambulatory Visit | Attending: Critical Care Medicine | Admitting: Critical Care Medicine

## 2019-10-08 DIAGNOSIS — N183 Chronic kidney disease, stage 3 unspecified: Secondary | ICD-10-CM | POA: Diagnosis not present

## 2019-10-08 DIAGNOSIS — Z79899 Other long term (current) drug therapy: Secondary | ICD-10-CM | POA: Diagnosis not present

## 2019-10-08 DIAGNOSIS — R05 Cough: Secondary | ICD-10-CM | POA: Insufficient documentation

## 2019-10-08 DIAGNOSIS — D649 Anemia, unspecified: Secondary | ICD-10-CM | POA: Diagnosis not present

## 2019-10-08 DIAGNOSIS — R9389 Abnormal findings on diagnostic imaging of other specified body structures: Secondary | ICD-10-CM | POA: Insufficient documentation

## 2019-10-08 DIAGNOSIS — R809 Proteinuria, unspecified: Secondary | ICD-10-CM | POA: Diagnosis not present

## 2019-10-08 DIAGNOSIS — I10 Essential (primary) hypertension: Secondary | ICD-10-CM | POA: Diagnosis not present

## 2019-10-08 DIAGNOSIS — E559 Vitamin D deficiency, unspecified: Secondary | ICD-10-CM | POA: Diagnosis not present

## 2019-10-10 ENCOUNTER — Other Ambulatory Visit: Payer: Self-pay

## 2019-10-10 ENCOUNTER — Ambulatory Visit (HOSPITAL_COMMUNITY)
Admission: RE | Admit: 2019-10-10 | Discharge: 2019-10-10 | Disposition: A | Discharge status: Home / Self Care | Payer: Medicare HMO | Source: Ambulatory Visit | Attending: Critical Care Medicine | Admitting: Critical Care Medicine

## 2019-10-10 DIAGNOSIS — R05 Cough: Secondary | ICD-10-CM | POA: Insufficient documentation

## 2019-10-10 DIAGNOSIS — E559 Vitamin D deficiency, unspecified: Secondary | ICD-10-CM | POA: Diagnosis not present

## 2019-10-10 DIAGNOSIS — R9389 Abnormal findings on diagnostic imaging of other specified body structures: Secondary | ICD-10-CM | POA: Insufficient documentation

## 2019-10-10 DIAGNOSIS — I350 Nonrheumatic aortic (valve) stenosis: Secondary | ICD-10-CM | POA: Diagnosis not present

## 2019-10-10 DIAGNOSIS — N178 Other acute kidney failure: Secondary | ICD-10-CM | POA: Diagnosis not present

## 2019-10-10 DIAGNOSIS — R058 Other specified cough: Secondary | ICD-10-CM

## 2019-10-10 DIAGNOSIS — N189 Chronic kidney disease, unspecified: Secondary | ICD-10-CM | POA: Diagnosis not present

## 2019-10-10 DIAGNOSIS — E1129 Type 2 diabetes mellitus with other diabetic kidney complication: Secondary | ICD-10-CM | POA: Diagnosis not present

## 2019-10-10 DIAGNOSIS — J479 Bronchiectasis, uncomplicated: Secondary | ICD-10-CM | POA: Diagnosis not present

## 2019-10-10 DIAGNOSIS — R809 Proteinuria, unspecified: Secondary | ICD-10-CM | POA: Diagnosis not present

## 2019-10-10 DIAGNOSIS — D631 Anemia in chronic kidney disease: Secondary | ICD-10-CM | POA: Diagnosis not present

## 2019-10-10 DIAGNOSIS — R918 Other nonspecific abnormal finding of lung field: Secondary | ICD-10-CM | POA: Diagnosis not present

## 2019-10-10 DIAGNOSIS — N141 Nephropathy induced by other drugs, medicaments and biological substances: Secondary | ICD-10-CM | POA: Diagnosis not present

## 2019-10-10 DIAGNOSIS — N184 Chronic kidney disease, stage 4 (severe): Secondary | ICD-10-CM | POA: Diagnosis not present

## 2019-10-10 LAB — ACID FAST SMEAR (AFB, MYCOBACTERIA): Acid Fast Smear: NEGATIVE

## 2019-10-12 LAB — CULTURE, RESPIRATORY W GRAM STAIN: Culture: NORMAL

## 2019-10-27 ENCOUNTER — Observation Stay (HOSPITAL_COMMUNITY)
Admission: RE | Admit: 2019-10-27 | Discharge: 2019-10-28 | Disposition: A | Discharge status: Home / Self Care | Payer: Medicare HMO | Source: Ambulatory Visit | Attending: Cardiovascular Disease | Admitting: Cardiovascular Disease

## 2019-10-27 DIAGNOSIS — I350 Nonrheumatic aortic (valve) stenosis: Secondary | ICD-10-CM | POA: Diagnosis not present

## 2019-10-27 DIAGNOSIS — Z794 Long term (current) use of insulin: Secondary | ICD-10-CM | POA: Insufficient documentation

## 2019-10-27 DIAGNOSIS — E1140 Type 2 diabetes mellitus with diabetic neuropathy, unspecified: Secondary | ICD-10-CM | POA: Diagnosis not present

## 2019-10-27 DIAGNOSIS — Z950 Presence of cardiac pacemaker: Secondary | ICD-10-CM | POA: Insufficient documentation

## 2019-10-27 DIAGNOSIS — N183 Chronic kidney disease, stage 3 unspecified: Secondary | ICD-10-CM | POA: Diagnosis not present

## 2019-10-27 DIAGNOSIS — I130 Hypertensive heart and chronic kidney disease with heart failure and stage 1 through stage 4 chronic kidney disease, or unspecified chronic kidney disease: Secondary | ICD-10-CM | POA: Insufficient documentation

## 2019-10-27 DIAGNOSIS — I5042 Chronic combined systolic (congestive) and diastolic (congestive) heart failure: Secondary | ICD-10-CM | POA: Diagnosis not present

## 2019-10-27 DIAGNOSIS — Z951 Presence of aortocoronary bypass graft: Secondary | ICD-10-CM | POA: Insufficient documentation

## 2019-10-27 DIAGNOSIS — Z7982 Long term (current) use of aspirin: Secondary | ICD-10-CM | POA: Insufficient documentation

## 2019-10-27 DIAGNOSIS — Z79899 Other long term (current) drug therapy: Secondary | ICD-10-CM | POA: Insufficient documentation

## 2019-10-27 DIAGNOSIS — Z20828 Contact with and (suspected) exposure to other viral communicable diseases: Secondary | ICD-10-CM | POA: Diagnosis not present

## 2019-10-27 DIAGNOSIS — I2510 Atherosclerotic heart disease of native coronary artery without angina pectoris: Secondary | ICD-10-CM | POA: Insufficient documentation

## 2019-10-27 DIAGNOSIS — E860 Dehydration: Secondary | ICD-10-CM | POA: Diagnosis present

## 2019-10-27 LAB — CBC
HCT: 28.8 % — ABNORMAL LOW (ref 39.0–52.0)
Hemoglobin: 9.2 g/dL — ABNORMAL LOW (ref 13.0–17.0)
MCH: 30.5 pg (ref 26.0–34.0)
MCHC: 31.9 g/dL (ref 30.0–36.0)
MCV: 95.4 fL (ref 80.0–100.0)
Platelets: 133 10*3/uL — ABNORMAL LOW (ref 150–400)
RBC: 3.02 MIL/uL — ABNORMAL LOW (ref 4.22–5.81)
RDW: 13.8 % (ref 11.5–15.5)
WBC: 6.6 10*3/uL (ref 4.0–10.5)
nRBC: 0 % (ref 0.0–0.2)

## 2019-10-27 LAB — BASIC METABOLIC PANEL
Anion gap: 8 (ref 5–15)
BUN: 21 mg/dL (ref 8–23)
CO2: 31 mmol/L (ref 22–32)
Calcium: 8.6 mg/dL — ABNORMAL LOW (ref 8.9–10.3)
Chloride: 96 mmol/L — ABNORMAL LOW (ref 98–111)
Creatinine, Ser: 1.39 mg/dL — ABNORMAL HIGH (ref 0.61–1.24)
GFR calc Af Amer: 59 mL/min — ABNORMAL LOW (ref >60)
GFR calc non Af Amer: 51 mL/min — ABNORMAL LOW (ref >60)
Glucose, Bld: 394 mg/dL — ABNORMAL HIGH (ref 70–99)
Potassium: 4.2 mmol/L (ref 3.5–5.1)
Sodium: 135 mmol/L (ref 135–145)

## 2019-10-27 LAB — MAGNESIUM: Magnesium: 2.3 mg/dL (ref 1.7–2.4)

## 2019-10-27 MED ORDER — DULOXETINE HCL 30 MG PO CPEP
30.0000 mg | ORAL_CAPSULE | Freq: Every day | ORAL | Status: DC
  Administered 2019-10-27: 30 mg via ORAL
  Filled 2019-10-27: qty 1

## 2019-10-27 MED ORDER — SENNA 8.6 MG PO TABS
1.0000 | ORAL_TABLET | Freq: Two times a day (BID) | ORAL | Status: DC
  Administered 2019-10-27 – 2019-10-28 (×2): 8.6 mg via ORAL
  Filled 2019-10-27 (×2): qty 1

## 2019-10-27 MED ORDER — PREGABALIN 25 MG PO CAPS
25.0000 mg | ORAL_CAPSULE | Freq: Every day | ORAL | Status: DC
  Administered 2019-10-27: 25 mg via ORAL
  Filled 2019-10-27: qty 1

## 2019-10-27 MED ORDER — HYDROXYCHLOROQUINE SULFATE 200 MG PO TABS
200.0000 mg | ORAL_TABLET | Freq: Every day | ORAL | Status: DC
  Administered 2019-10-27: 200 mg via ORAL
  Filled 2019-10-27: qty 1

## 2019-10-27 MED ORDER — ATORVASTATIN CALCIUM 80 MG PO TABS
80.0000 mg | ORAL_TABLET | Freq: Every day | ORAL | Status: DC
  Administered 2019-10-28: 80 mg via ORAL
  Filled 2019-10-27 (×2): qty 1

## 2019-10-27 MED ORDER — PREDNISONE 5 MG PO TABS
5.0000 mg | ORAL_TABLET | Freq: Every day | ORAL | Status: DC
  Administered 2019-10-28: 5 mg via ORAL
  Filled 2019-10-27: qty 1

## 2019-10-27 MED ORDER — ENOXAPARIN SODIUM 30 MG/0.3ML ~~LOC~~ SOLN
30.0000 mg | SUBCUTANEOUS | Status: DC
  Administered 2019-10-27: 30 mg via SUBCUTANEOUS
  Filled 2019-10-27: qty 0.3

## 2019-10-27 MED ORDER — INSULIN ASPART PROT & ASPART (70-30 MIX) 100 UNIT/ML ~~LOC~~ SUSP
8.0000 [IU] | Freq: Every day | SUBCUTANEOUS | Status: DC
  Administered 2019-10-27: 8 [IU] via SUBCUTANEOUS
  Filled 2019-10-27: qty 10

## 2019-10-27 MED ORDER — POLYETHYLENE GLYCOL 3350 17 GM/SCOOP PO POWD
17.0000 g | Freq: Every day | ORAL | Status: DC
  Filled 2019-10-27: qty 255

## 2019-10-27 MED ORDER — DOCUSATE SODIUM 100 MG PO CAPS
100.0000 mg | ORAL_CAPSULE | Freq: Three times a day (TID) | ORAL | Status: DC
  Administered 2019-10-27 – 2019-10-28 (×2): 100 mg via ORAL
  Filled 2019-10-27 (×2): qty 1

## 2019-10-27 MED ORDER — ASPIRIN EC 81 MG PO TBEC
81.0000 mg | DELAYED_RELEASE_TABLET | Freq: Every day | ORAL | Status: DC
  Administered 2019-10-27 – 2019-10-28 (×2): 81 mg via ORAL
  Filled 2019-10-27 (×2): qty 1

## 2019-10-27 MED ORDER — TAMSULOSIN HCL 0.4 MG PO CAPS
0.4000 mg | ORAL_CAPSULE | Freq: Every day | ORAL | Status: DC
  Administered 2019-10-27: 0.4 mg via ORAL
  Filled 2019-10-27: qty 1

## 2019-10-27 MED ORDER — AMITRIPTYLINE HCL 25 MG PO TABS
75.0000 mg | ORAL_TABLET | Freq: Every day | ORAL | Status: DC
  Administered 2019-10-27: 75 mg via ORAL
  Filled 2019-10-27: qty 3

## 2019-10-27 MED ORDER — INSULIN ASPART PROT & ASPART (70-30 MIX) 100 UNIT/ML ~~LOC~~ SUSP
12.0000 [IU] | Freq: Every day | SUBCUTANEOUS | Status: DC
  Administered 2019-10-28: 12 [IU] via SUBCUTANEOUS
  Filled 2019-10-27: qty 10

## 2019-10-27 MED ORDER — POLYETHYLENE GLYCOL 3350 17 G PO PACK
17.0000 g | PACK | Freq: Every day | ORAL | Status: DC
  Administered 2019-10-27: 17 g via ORAL
  Filled 2019-10-27: qty 1

## 2019-10-27 MED ORDER — SODIUM CHLORIDE 0.9 % IV SOLN
INTRAVENOUS | Status: DC
  Administered 2019-10-27 – 2019-10-28 (×2): via INTRAVENOUS

## 2019-10-27 MED ORDER — FINASTERIDE 5 MG PO TABS
5.0000 mg | ORAL_TABLET | Freq: Every day | ORAL | Status: DC
  Administered 2019-10-27 – 2019-10-28 (×2): 5 mg via ORAL
  Filled 2019-10-27 (×2): qty 1

## 2019-10-27 MED ORDER — METHADONE HCL 10 MG PO TABS: 5.0000 mg | ORAL_TABLET | Freq: Three times a day (TID) | ORAL | Status: DC | PRN

## 2019-10-27 MED ORDER — VITAMIN D 25 MCG (1000 UNIT) PO TABS
1000.0000 [IU] | ORAL_TABLET | Freq: Every day | ORAL | Status: DC
  Administered 2019-10-27 – 2019-10-28 (×2): 1000 [IU] via ORAL
  Filled 2019-10-27 (×3): qty 1

## 2019-10-27 MED ORDER — METHADONE HCL 10 MG PO TABS
10.0000 mg | ORAL_TABLET | Freq: Four times a day (QID) | ORAL | Status: DC
  Administered 2019-10-28: 10 mg via ORAL
  Filled 2019-10-27 (×2): qty 1

## 2019-10-27 MED ORDER — CLONAZEPAM 0.25 MG PO TBDP: 0.2500 mg | ORAL_TABLET | Freq: Every day | ORAL | Status: DC | PRN

## 2019-10-27 NOTE — H&P (Signed)
History and Physical   Patient ID: [name], MRN: [id number], DOB: August 30, 1951   Date of Encounter: 10/27/2019, 8:02 PM  Primary Care Provider: [name], MD Cardiologist: Angelena Form Electrophysiologist:  Caryl Comes  Chief Complaint:  Pre-hydration for CT  History of Present Illness:  [name] is a 68 y.o. male with a history of CAD s/p CABG x3 in 06/2015, chronic combined CHF with EF as low as 30-35% but improved to 60-65% on Echo in 06/2019, severe aortic stenosis, hypertension, diabetes mellitus, post-op atrial fibrillation following CABG with no recurrence (not on anticoagulation), PAD s/p left below the knee amputation, CKD stage III, chronic back/neck pain, and rheumatoid arthritis who is followed by Dr. [name].    Patient was admitted to [facility] in 06/2019 for chest pain and was found to be hypotensive and bradycardic. He was treated with atropine, bicarb, epinephrine, and fluid resuscitation with good response. Echo on 07/02/2019 showed LVEF of 60-65% with thickened/calcified aortic valve leaflet with limited mobility. The mean gradient is 16.8 mmHg, peak gradient 27.5 mmHg, AVA 0.79 cm2, dimensionless index 0.29. Patient also noted to have AK with creatinine peaking at 3.28 (improved to 1.65 at discharge) and elevated LFTs. Outpatient stress test was peformed on 07/10/2019 and showed medium defect of mild severity present in the mid inferoseptal and mid inferior location. Therewas signficant gut uptake whichwas likely causing this defectand no large ischemic territorieswere noted. Was read as intermediate-riskdue to reduced EF but this was normal by recent echocardiogram.    He was seen by Dr. Angelena Form in our [facility] on 08/29/2019 to discuss possible TAVR. At that visit, he reported chest pain concerning for unstable angina. Patient ended up being sent over the the ED for admission by Cardiology team. EKG on arrival showed complete heart rate with junctional  escape at 36 bpm. He underwent temporary pacemaker on 08/29/2019 and then had permanent pacemaker place on 08/30/2019. He tolerated this well. No pneumothorax on chest x-ray the next day. He underwent right/left heart cath on 09/03/2019 which showed patent LIMA-LAD, SVG-RI with moderate ostial and proximal disease in the SVG-RPDA not a good PCI target. No intervention. Normal right heart cath pressures and normal cardiac output. Continued TAVR work up as outpatient was recommended. Following cardiac catheterization he was noted to have bleeding at [facility] site. EP re-evaluated patient and noted soft hematoma with some bleeding. Manual pressure and pressure dressing were applied.   He was last seen in cardiology clinic on 09/10/2019 at which time patient's biggest complaint was a productive cough. He was reported mild increased shortness of breath and lower extremity edema and was up 12 lbs since discharge. Labs at that visit showed acute renal failure with creatinine of 3.19 (up from 1.67 on recent discharge) and BNP of > 15,000. He was advised to go to the ED and was admitted for acute renal failure. Urinalysis on admission consistent with UTI and urine culture positive for pseudomonas aeruginosa. Patient treated with antibiotics. Renal ultrasound showed shadowing calcifications within the kidneys but no evidence of hydronephrosis. Nephrology was consulted and recommended holding home Torsemide and encouraging oral hydration. AKI felt to be due to IV contrast. Creatinine improved to 1.95 on discharge and patient was advised to follow-up with Nephrology.   Patient was seen by Nephrology on 10/10/2019 at which time he was doing well. Nephrology discussed with patient need for further contrast for TAVR work-up and possible for renal replacement therapy. Nephrology recommended holding home Torsemide one day prior  to CT scans and IV NS 1 ml/kg/hr for 6-12 hours pre procedure and post procedure as long as patient has no edema  and is not in fluid overload.   Past Medical History:  Diagnosis Date   Aortic stenosis    ARF (acute renal failure) ([place]) 09/11/2019   Chronic back pain    Chronic combined systolic (congestive) and diastolic (congestive) heart failure ([place])    a. EF 30-35% in 2016 b. at 30-35% by echo in 08/2016 c. 06/2019: echo showing EF of 60-65% but found to have severe AS.    Chronic neck pain    CKD (chronic kidney disease), stage III ([place])    Complete heart block ([place]) 08/29/2019   a. s/p Medtronic dual chamber pacemeker implantation on 08/30/2019.   Coronary artery disease    a. s/p CABG in 06/2015 with LIMA-LAD, SVG-PDA, SVG-Intermediate   Diabetes mellitus    Diabetic neuropathy (HCC)    Hyperkalemia    Hypertension    Loculated pleural effusion 09/09/2016   Pancytopenia ([place]) 08/13/2014   Prolonged QT interval 08/14/2014   Possibly secondary to methadone and amitriptyline.   Rheumatoid arthritis(714.0)    Seizure ([place]) 08/13/2014   Pt denies    Past Surgical History:  Procedure Laterality Date   BELOW KNEE LEG AMPUTATION Left 06/216   left leg   CARDIAC SURGERY     FRACTURE SURGERY     MUSCLE BIOPSY  06/2016   PACEMAKER IMPLANT N/A 08/30/2019   Procedure: PACEMAKER IMPLANT;  Surgeon: Evans Lance, MD;  Location: [place] [facility];  Service: Cardiovascular;  Laterality: N/A;   right foot surgery-Toe amputations     RIGHT/LEFT HEART CATH AND CORONARY/GRAFT ANGIOGRAPHY N/A 09/03/2019   Procedure: RIGHT/LEFT HEART CATH AND CORONARY/GRAFT ANGIOGRAPHY;  Surgeon: Leonie Man, MD;  Location: [place] [facility];  Service: Cardiovascular;  Laterality: N/A;   TEMPORARY PACEMAKER N/A 08/29/2019   Procedure: TEMPORARY PACEMAKER;  Surgeon: Sherren Mocha, MD;  Location: [place] [facility];  Service: Cardiovascular;  Laterality: N/A;   TOTAL HIP ARTHROPLASTY Left      Prior to Admission medications   Medication Sig Start Date End Date Taking? Authorizing Provider    amitriptyline (ELAVIL) 50 MG tablet TAKE 1 AND 1/2 TABLETS BY MOUTH AT BEDTIME. Patient taking differently: Take 75 mg by mouth at bedtime.  05/06/19   [name], MD  aspirin EC 81 MG tablet Take 81 mg by mouth daily.    [provider]  atorvastatin (LIPITOR) 80 MG tablet TAKE ONE TABLET BY MOUTH ONCE DAILY. Patient taking differently: Take 80 mg by mouth daily.  06/03/19   [name], MD  blood glucose meter kit and supplies KIT Dispense based on patient and insurance preference. Use up to 3 times daily as directed. DX:E11.9 08/21/19   [name], MD  Cholecalciferol (VITAMIN D3) 25 MCG (1000 UT) CHEW Chew 1,000 Units by mouth daily.     [provider]  clonazePAM (KLONOPIN) 0.25 MG disintegrating tablet DISSOLVE 1 TABLET BY MOUTH DAILY AS NEEDED FOR ANXIETY. Patient taking differently: Take 0.25 mg by mouth daily as needed (for anxiety- dissolve in the mouth).  07/22/19   [name], MD  docusate sodium (COLACE) 100 MG capsule Take 100 mg by mouth 3 (three) times daily.     [provider]  DULoxetine (CYMBALTA) 30 MG capsule Take 30 mg by mouth at bedtime.  10/16/18 10/16/19  [provider]  finasteride (PROSCAR) 5  MG tablet Take 5 mg by mouth daily.  04/24/18   [provider]  hydroxychloroquine (PLAQUENIL) 200 MG tablet Take 200 mg by mouth at bedtime.  04/24/18   [provider]  insulin aspart protamine- aspart (NOVOLOG MIX 70/30) (70-30) 100 UNIT/ML injection Inject 12 units into skin in the morning  and 8 units evening Patient taking differently: Inject 8-12 Units into the skin See admin instructions. Inject 12 units into the skin in the morning and 8 units in the evening 05/06/19   [name], MD  insulin NPH-regular Human (70-30) 100 UNIT/ML injection Inject 8-12 Units into the skin See admin instructions. 12 units in the morning and 8 units in the evening.    [provider]  methadone  (DOLOPHINE) 10 MG tablet Take 10 mg by mouth every 6 (six) hours.  04/18/18   [provider]  methadone (DOLOPHINE) 5 MG tablet Take 5 mg by mouth 3 (three) times daily as needed (for breakthrough pain).  04/18/18   [provider]  nitroGLYCERIN (NITROSTAT) 0.4 MG SL tablet Place 1 tablet (0.4 mg total) under the tongue every 5 (five) minutes x 3 doses as needed for chest pain. 09/04/19   [facility], Tami Lin, PA  polyethylene glycol powder (MIRALAX) powder Take 17 g by mouth 2 (two) times daily. Patient taking differently: Take 17 g by mouth at bedtime.  03/01/18   [name], MD  Polyvinyl Alcohol-Povidone (REFRESH OP) Place 1 drop into both eyes 3 (three) times daily as needed (for dry eyes).     [provider]  predniSONE (DELTASONE) 5 MG tablet TAKE 1 TABLET BY MOUTH ONCE A DAY WITH BREAKFAST. Patient taking differently: Take 5 mg by mouth daily with breakfast.  06/03/19   [name], MD  pregabalin (LYRICA) 25 MG capsule TAKE ONE CAPSULE BY MOUTH AT BEDTIME. Patient taking differently: Take 25 mg by mouth at bedtime.  06/03/19   [name], MD  senna (SENOKOT) 8.6 MG TABS tablet Take 1 tablet by mouth 2 (two) times daily.    [provider]  tamsulosin (FLOMAX) 0.4 MG CAPS capsule Take 1 capsule (0.4 mg total) by mouth at bedtime. 05/06/19   [name], MD  torsemide (DEMADEX) 10 MG tablet TAKE 2 TABLETS BY MOUTH EVERY OTHER DAY. Patient taking differently: Take 20 mg by mouth every other day.  05/06/19   [name], MD     Allergies: Allergies  Allergen Reactions   Sulfa Antibiotics Rash    Social History:  The patient  reports that he has never smoked. He has never used smokeless tobacco. He reports that he does not drink alcohol or use drugs.   Family History:  The patient's family history includes Alzheimer's disease in his father; COPD in his brother; Diabetes in his father; Head & neck cancer in his nephew; Heart attack  in his brother; Hypertension in his mother; Stroke in his mother.   ROS:  Please see the history of present illness.     All other systems reviewed and negative.   Vital Signs: Blood pressure 137/69, pulse 81, temperature 98.1 F (36.7 C), temperature source Oral, resp. rate 20, height '5\' 8"'$  (1.727 m), weight 74.1 kg, SpO2 99 %.  PHYSICAL EXAM: General:  Well nourished, well developed, in no acute distress HEENT: normal Lymph: no adenopathy Neck: no JVD Endocrine:  No thryomegaly Vascular: No carotid bruits; s/p L BKA  Cardiac:  normal S1, S2; RRR; 2-3/6  sys murmur at the base w/ radiation to carotids bilaterally Lungs:  Dec BS bilaterally Abd: soft, nontender, no hepatomegaly  Ext: no edema Musculoskeletal:  No deformities, BUE and BLE strength normal and equal Skin: warm and dry  Neuro:  CNs 2-12 intact, no focal abnormalities noted Psych:  Normal affect   EKG:  09-12-19 V-paced  Labs:   Lab Results  Component Value Date   WBC 6.6 10/27/2019   HGB 9.2 (L) 10/27/2019   HCT 28.8 (L) 10/27/2019   MCV 95.4 10/27/2019   PLT 133 (L) 10/27/2019   Recent Labs  Lab 10/27/19 1845  NA 135  K 4.2  CL 96*  CO2 31  BUN 21  CREATININE 1.39*  CALCIUM 8.6*  GLUCOSE 394*   No results for input(s): CKTOTAL, CKMB, TROPONINI in the last 72 hours. Troponin (Point of Care Test) No results for input(s): TROPIPOC in the last 72 hours.  Lab Results  Component Value Date   CHOL 115 08/30/2019   HDL 48 08/30/2019   LDLCALC 58 08/30/2019   TRIG 43 08/30/2019   No results found for: DDIMER  Radiology/Studies:  Ct Chest Wo Contrast  Result Date: 10/11/2019 CLINICAL DATA:  Evaluation of a right lower lobe opacity seen on radiographs, chronic cough EXAM: CT CHEST WITHOUT CONTRAST TECHNIQUE: Multidetector CT imaging of the chest was performed following the standard protocol without IV contrast. COMPARISON:  Chest radiograph 09/11/2019, CT chest 10/16/2016 FINDINGS: Cardiovascular:  Pacer pack overlies the left chest wall with leads at the right atrium and cardiac apex. Mild cardiomegaly. There is extensive calcification of the native coronary arteries and postsurgical changes from prior CABG. The aorta is calcified but remains normal caliber. Normal 3 vessel branching with calcifications in the proximal great vessels. Central pulmonary arteries are borderline enlarged which may reflect chronic pulmonary artery hypertension. Luminal evaluation precluded in the absence of contrast. Mediastinum/Nodes: No enlarged mediastinal or axillary lymph nodes. Thyroid gland, trachea, and esophagus demonstrate no significant findings. Lungs/Pleura: Scarring and architectural distortion is present in the right lower lobe corresponding to the radiographic abnormality. There is associated traction bronchiectasis in the right lower lobe and milder changes in the left lower lobe as well. Overall, these features are diminished from comparison CT dated 10/16/2016. Additional dependent atelectasis is seen posteriorly in both lungs. More bandlike areas of opacity in the left lower lobe could reflect additional scarring and/or subsegmental atelectasis. No suspicious pulmonary nodules or masses. Upper Abdomen: Extensive atherosclerotic calcifications are noted throughout the upper abdomen. Multiple calcified gallstones layer dependently within the gallbladder. Borderline splenomegaly. Moderate stool burden. Musculoskeletal: Post sternotomy changes. Slight exaggeration of the thoracic kyphosis with multilevel degenerative changes in the spine. Severe arthrosis is noted in both shoulders with subchondral cystic change in articular surface collapse of both humeral heads. High-riding appearance of the humeri likely reflect superimposed rotator cuff insufficiency. IMPRESSION: 1. Scarring, architectural distortion and bronchiectasis in the right lower lobe corresponding to the radiographic abnormality, overall diminished  from comparison CT dated 10/16/2016. Findings are favored to reflect sequela of prior infection/inflammation. 2. Central pulmonary artery enlargement compatible with chronic pulmonary artery hypertension. 3. Postsurgical changes from prior CABG. Extensive calcification of the native coronary arteries. 4. Aortic Atherosclerosis (ICD10-I70.0). 5. Cholelithiasis. 6. Borderline splenomegaly. 7. Severe arthrosis in both shoulders with subchondral cystic change in articular surface collapse of both humeral heads. High-riding appearance of the humeri likely reflect superimposed rotator cuff insufficiency. Electronically Signed   By: Lovena Le M.D.   On: 10/11/2019 04:03  09-03-19 LHC/RHC  Ost RCA to Dist RCA lesion is 100% stenosed.  Dist LM to Mid LAD lesion is 100% stenosed.  Ramus lesion is 100% stenosed.  1st Mrg lesion is 65% stenosed.  GRAFTS  LIMA-LAD graft was visualized by angiography and is normal in caliber. The graft exhibits no disease.  SVG-RI graft was visualized by angiography and is normal in caliber. The graft exhibits no disease.  SVG-rPDA graft was visualized by angiography and is large. Origin lesion is 50% stenosed.  RPDA lesion is 75% stenosed.  07-02-19 TTE 1. The left ventricle has normal systolic function with an ejection fraction of 60-65%. The cavity size was normal. Left ventricular diastolic Doppler parameters are consistent with pseudonormalization.  2. The right ventricle has normal systolic function. The cavity was normal. There is no increase in right ventricular wall thickness.  3. The mitral valve is abnormal. Mild thickening of the mitral valve leaflet. There is moderate mitral annular calcification present.  4. The aortic valve is abnormal. Severely thickening of the aortic valve. Severe calcifcation of the aortic valve. Aortic valve regurgitation is mild to moderate by color flow Doppler. Severe stenosis of the aortic valve.  5. The aortic root and  ascending aorta are normal in size and structure.  6. The interatrial septum was not assessed.  ASSESSMENT AND PLAN:   1. Severe AS: pt being admitted for pre-hydration prior to CT to be done as part of workup for TAVR. Nephrology recommended holding home Torsemide one day prior to CT scans and IV NS 1 ml/kg/hr for 6-12 hours pre procedure and post procedure as long as patient has no edema and is not in fluid overload.  TAVR imaging orders to be placed in the AM.  2. CAD: h/o CABG. Stable at this time. Cont pre-hospital med regimen.  3. HTN/DM2: pre-hospital med regimen.  4. CKD: pre-hydration as per nephrology above. Will hold torsemide this evening.  Thank you for the opportunity to participate in the care of this patient.  For questions or updates, please contact [place] Please consult [email or web address] for contact info under   Signed,  Rudean Curt, MD, [facility] 10/27/2019 8:02 PM

## 2019-10-27 NOTE — Progress Notes (Signed)
   Patient being directly admitted today for pre-hydration for CT scans tomorrow as part of TAVR work-up. Patient is not going to arrive while daytime staff still here. Dr. Acie Fredrickson is going to sign out patient to overnight fellow.   Patient Nephrologist has recommended holding home Torsemide one day prior to CT scans (which would be today and tomorrow morning's dose) and IV NS 1 ml/kg/hr for 6-12 hours pre procedure and post procedure as long as patient has no edema and is not in fluid overload.   TAVR team will place orders for CT scans in the morning.  Darreld Mclean, PA-C 10/27/2019 5:15 PM

## 2019-10-28 ENCOUNTER — Ambulatory Visit: Payer: Medicare HMO | Admitting: Critical Care Medicine

## 2019-10-28 ENCOUNTER — Encounter (HOSPITAL_COMMUNITY): Payer: Self-pay | Admitting: Radiology

## 2019-10-28 ENCOUNTER — Ambulatory Visit (HOSPITAL_BASED_OUTPATIENT_CLINIC_OR_DEPARTMENT_OTHER): Payer: Medicare HMO

## 2019-10-28 ENCOUNTER — Other Ambulatory Visit: Payer: Self-pay

## 2019-10-28 ENCOUNTER — Observation Stay (HOSPITAL_COMMUNITY): Payer: Medicare HMO

## 2019-10-28 DIAGNOSIS — I130 Hypertensive heart and chronic kidney disease with heart failure and stage 1 through stage 4 chronic kidney disease, or unspecified chronic kidney disease: Secondary | ICD-10-CM | POA: Diagnosis not present

## 2019-10-28 DIAGNOSIS — Z951 Presence of aortocoronary bypass graft: Secondary | ICD-10-CM | POA: Diagnosis not present

## 2019-10-28 DIAGNOSIS — Z950 Presence of cardiac pacemaker: Secondary | ICD-10-CM | POA: Diagnosis not present

## 2019-10-28 DIAGNOSIS — I350 Nonrheumatic aortic (valve) stenosis: Secondary | ICD-10-CM

## 2019-10-28 DIAGNOSIS — I5042 Chronic combined systolic (congestive) and diastolic (congestive) heart failure: Secondary | ICD-10-CM | POA: Diagnosis not present

## 2019-10-28 DIAGNOSIS — Z794 Long term (current) use of insulin: Secondary | ICD-10-CM | POA: Diagnosis not present

## 2019-10-28 DIAGNOSIS — Z01818 Encounter for other preprocedural examination: Secondary | ICD-10-CM | POA: Diagnosis not present

## 2019-10-28 DIAGNOSIS — Z20828 Contact with and (suspected) exposure to other viral communicable diseases: Secondary | ICD-10-CM | POA: Diagnosis not present

## 2019-10-28 DIAGNOSIS — N183 Chronic kidney disease, stage 3 unspecified: Secondary | ICD-10-CM | POA: Diagnosis not present

## 2019-10-28 DIAGNOSIS — I2510 Atherosclerotic heart disease of native coronary artery without angina pectoris: Secondary | ICD-10-CM | POA: Diagnosis not present

## 2019-10-28 LAB — BASIC METABOLIC PANEL
Anion gap: 6 (ref 5–15)
BUN: 21 mg/dL (ref 8–23)
CO2: 31 mmol/L (ref 22–32)
Calcium: 8.3 mg/dL — ABNORMAL LOW (ref 8.9–10.3)
Chloride: 102 mmol/L (ref 98–111)
Creatinine, Ser: 1.21 mg/dL (ref 0.61–1.24)
GFR calc Af Amer: >60 mL/min (ref >60)
GFR calc non Af Amer: >60 mL/min (ref >60)
Glucose, Bld: 156 mg/dL — ABNORMAL HIGH (ref 70–99)
Potassium: 4.5 mmol/L (ref 3.5–5.1)
Sodium: 139 mmol/L (ref 135–145)

## 2019-10-28 LAB — SARS CORONAVIRUS 2 (TAT 6-24 HRS): SARS Coronavirus 2: NEGATIVE

## 2019-10-28 MED ORDER — IOHEXOL 350 MG/ML SOLN
95.0000 mL | Freq: Once | INTRAVENOUS | Status: AC | PRN
  Administered 2019-10-28: 95 mL via INTRAVENOUS

## 2019-10-28 NOTE — Discharge Instructions (Signed)
Aortic Valve Stenosis  Aortic valve stenosis is a narrowing of the aortic valve in the heart. The aortic valve opens and closes to regulate blood flow between the left side of the heart (left ventricle) and the artery that leads away from the heart (aorta). When the aortic valve becomes narrow, it is difficult for the heart to pump blood out to the body, which causes the heart to work harder. The extra work can weaken the heart muscle over time. Aortic valve stenosis can range from mild to severe. If it is not treated, it can become more severe over time and lead to heart failure. What are the causes? This condition may be caused by:  Buildup of calcium around and on the aortic valve. This can occur with aging. This is the most common cause of aortic valve stenosis.  A heart problem that developed in the womb (birth defect).  Rheumatic fever.  Radiation to the chest. What increases the risk? You may be more likely to develop this condition if:  You are older than age 102.  You were born with an abnormal bicuspid valve. What are the signs or symptoms? You may not have any symptoms until your condition becomes severe. It may take 10-20 years for mild or moderate aortic valve stenosis to become severe. Symptoms may include:  Shortness of breath. This may get worse during physical activity.  Feeling unusually weak and tired (fatigue).  Extreme discomfort in the chest, neck, or arm during physical activity (angina).  A heartbeat that is irregular or faster than normal (palpitations).  Dizziness or fainting. This may happen when you get physically tired or after you take certain heart medicines, such as nitroglycerin. How is this diagnosed? This condition may be diagnosed with:  A physical exam.  Echocardiogram. This is a type of imaging test that uses sound waves (ultrasound) to make images of your heart. There are two kinds of this test that may be used. ? Transthoracic  echocardiogram (TTE). For this type, a wand-like tool (transducer) is moved over your chest to create ultrasound images that are recorded by a computer. ? Transesophageal echocardiogram (TEE). For this type, a flexible tube (probe) is inserted down the part of the body that moves food from your mouth to your stomach (esophagus). The heart and the esophagus are close to each other. Your health care provider will use the probe to take clear, detailed pictures of the heart.  Cardiac catheterization. For this procedure, a small, thin tube (catheter) is passed through a large vein in your neck, groin, or arm. The catheter is used to get information about arteries, structures, blood pressure, and oxygen levels in your heart.  Stress tests. These are tests that evaluate the blood supply to your heart and your heart's response to exercise. You may work with a health care provider who specializes in the heart (cardiologist) for diagnosis and treatment. How is this treated? Treatment depends on how severe your condition is and what your symptoms are. You will need to have your heart checked regularly to make sure that your condition is not getting worse or causing serious problems. Treatment may also include:  Surgery to replace your aortic valve. This is the most common treatment for aortic valve stenosis, and it is the only treatment to cure the condition. Several types of surgeries are available. The surgery may be done: ? Through a large incision over your heart (open-heart surgery). ? Through small incisions, using a flexible tube called a catheter (  transcatheter aortic valve replacement, TAVR).  Medicines that help to keep your heart rate regular.  Medicines that thin your blood (anticoagulants) to prevent blood clots.  Antibiotic medicines to help prevent infection. If your condition is mild, you may only need regular follow-up visits for monitoring. Follow these instructions at  home: Lifestyle  Limit alcohol intake to no more than 1 drink a day for nonpregnant women and 2 drinks a day for men. One drink equals 12 oz of beer, 5 oz of wine, or 1 oz of hard liquor.  Do not use any products that contain nicotine or tobacco, such as cigarettes and e-cigarettes. If you need help quitting, ask your health care provider.  Work with your health care provider to manage your blood pressure and cholesterol.  Maintain a healthy weight. Eating and drinking   Eat a heart-healthy diet that includes plenty of fresh fruits and vegetables, whole grains, lean protein, and low-fat or nonfat dairy.  Limit how much caffeine you drink. Caffeine can affect your heart's rate and rhythm.  Avoid foods that are: ? High in salt (sodium), saturated fat, or sugar. ? Canned or highly processed. ? Fried.  Follow instructions from your health care provider about any other eating or drinking restrictions. Activity  Exercise regularly and return to your normal activities as told by your health care provider. Ask your health care provider what amount and type of physical activity is safe for you. ? If your aortic valve stenosis is mild, you may only need to avoid very intense physical activity, such as heavy weight lifting. ? The more severe your aortic valve stenosis is, the more activities you may need to avoid. If you are taking blood thinners:  Before you take any medicines that contain aspirin or NSAIDs, talk with your health care provider. These medicines increase your risk for dangerous bleeding.  Take your medicine exactly as told, at the same time every day.  Avoid activities that could cause injury or bruising, and follow instructions about how to prevent falls.  Wear a medical alert bracelet or carry a card that lists what medicines you take. General instructions  Take over-the-counter and prescription medicines only as told by your health care provider.  If you were  prescribed an antibiotic, take it as told by your health care provider. Do not stop taking the antibiotic even if you start to feel better.  If you are a woman and you plan to become pregnant, talk with your health care provider before you become pregnant.  Before you have any type of medical or dental procedure or surgery, tell all health care providers that you have aortic valve stenosis. This may affect the treatment that you receive.  Keep all follow-up visits as told by your health care provider. This is important. Contact a health care provider if:  You have a fever. Get help right away if:  You develop any of the following symptoms: ? Chest pain. ? Chest tightness. ? Shortness of breath. ? Trouble breathing.  You feel light-headed.  You feel like you might faint.  Your heartbeat is irregular or faster than normal. These symptoms may represent a serious problem that is an emergency. Do not wait to see if the symptoms will go away. Get medical help right away. Call your local emergency services (911 in the [place] Do not drive yourself to the hospital. Summary  Aortic valve stenosis is a narrowing of the aortic valve in the heart. The aortic valve opens and  closes to regulate blood flow between the left side of the heart (left ventricle) and the artery that leads away from the heart (aorta).  Aortic valve stenosis can range from mild to severe. If it is not treated, it can become more severe over time and lead to heart failure.  Treatment depends on how severe your condition is and what your symptoms are. You will need to have your heart checked regularly to make sure that your condition is not getting worse or causing serious problems.  Exercise regularly and return to your normal activities as told by your health care provider. Ask your health care provider what amount and type of physical activity is safe for you. This information is not intended to replace advice given to you  by your health care provider. Make sure you discuss any questions you have with your health care provider. Document Released: 09/10/2003 Document Revised: 11/24/2017 Document Reviewed: 09/14/2017 Elsevier Patient Education  2020 [employer].

## 2019-10-28 NOTE — Progress Notes (Signed)
VASCULAR LAB PRELIMINARY  PRELIMINARY  PRELIMINARY  PRELIMINARY  Carotid duplex completed.    Preliminary report:  See CV proc for preliminary results.   Tarryn Bogdan, RVT 10/28/2019, 5:04 PM

## 2019-10-28 NOTE — Progress Notes (Deleted)
Synopsis: Referred in September 2020 for productive cough by [name], MD.  Subjective:   PATIENT ID: [name] GENDER: male DOB: 03-08-1951, MRN: [id number]  No chief complaint on file.       Candida tropicals on sputum*** [email or web address]  OV 09/25/2019: [name] is a 68 year old gentleman with a history of severe coronary artery disease requiring four-vessel CABG in 2016, HFpEF, severe aortic stenosis, CKD, and rheumatoid arthritis currently on prednisone 74m and hydroxychloroquine who presents for evaluation of chronic cough with sputum production for past 2 years.  This started at a random time, not associated with an infection.  It has not worsened over time.  His sputum is usually gray, but sometimes green.  He expectorates sputum throughout the day, sometimes 1-2 times a day, sometimes 4 or more times per day.  He has had pneumonias in the past, but  no significant childhood lung infections.  He has no previous personal or family history of lung disease.  He has no history of cancer; he is a never smoker.  Although his rheumatoid arthritis regimen is minimal, in the past he has been on Remicade, methotrexate, and possibly other medications for more aggressive control.  He has had infectious complications in the past, limiting his ability to tolerate more immunosuppression.  He has chronic hoarseness, which he [place] had "weak vocal cords".  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  Otherwise his health is at baseline.  He is able to ambulate with a left lower extremity prosthesis, and he has a motorized wheelchair.  His wife assists him due to disfigurement from rheumatoid arthritis.   Past Medical History:   Diagnosis Date  . Aortic stenosis   . ARF (acute renal failure) ([place] 09/11/2019  . Chronic back pain   . Chronic combined systolic (congestive) and diastolic (congestive) heart failure (HCC)    a. EF 30-35% in 2016 b. at 30-35% by echo in 08/2016 c. 06/2019: echo showing EF of 60-65% but found to have severe AS.   .Marland KitchenChronic neck pain   . CKD (chronic kidney disease), stage III ([place]   . Complete heart block ([place] 08/29/2019   a. s/p Medtronic dual chamber pacemeker implantation on 08/30/2019.  .Marland KitchenCoronary artery disease    a. s/p CABG in 06/2015 with LIMA-LAD, SVG-PDA, SVG-Intermediate  . Diabetes mellitus   . Diabetic neuropathy ([place]   . Hyperkalemia   . Hypertension   . Loculated pleural effusion 09/09/2016  . Pancytopenia ([place] 08/13/2014  . 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  . Rheumatoid arthritis(714.0)   . Seizure ([place] 08/13/2014   Pt denies     Family History  Problem Relation Age of Onset  . Diabetes Father   . Alzheimer's disease Father   . Heart attack Brother        multiple brothers with MIs  . COPD Brother   . Hypertension Mother   . Stroke Mother   . Head & neck cancer Nephew      Past Surgical History:  Procedure Laterality Date  . 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 CARDIAC SURGERY    . FRACTURE SURGERY    . MUSCLE BIOPSY  06/2016  . PACEMAKER IMPLANT N/A 08/30/2019   Procedure: PACEMAKER IMPLANT;  Surgeon: Evans Lance, MD;  Location: [facility];  Service: Cardiovascular;  Laterality: N/A;  . right foot surgery-Toe amputations    . RIGHT/LEFT HEART CATH AND CORONARY/GRAFT ANGIOGRAPHY N/A 09/03/2019   Procedure: RIGHT/LEFT HEART CATH AND CORONARY/GRAFT ANGIOGRAPHY;  Surgeon: Leonie Man, MD;  Location: [facility];  Service: Cardiovascular;  Laterality: N/A;  . TEMPORARY PACEMAKER N/A 08/29/2019   Procedure: TEMPORARY PACEMAKER;  Surgeon: Sherren Mocha, MD;  Location:  [facility];  Service: Cardiovascular;  Laterality: N/A;  . TOTAL HIP ARTHROPLASTY Left     Social History   Socioeconomic History  . Marital status: Married    Spouse name: Not on file  . Number of children: 2  . Years of education: Not on file  . Highest education level: Not on file  Occupational History  . Occupation: Retired Barista  . Financial resource strain: Not on file  . Food insecurity    Worry: Not on file    Inability: Not on file  . Transportation needs    Medical: Not on file    Non-medical: Not on file  Tobacco Use  . Smoking status: Never Smoker  . Smokeless tobacco: Never Used  Substance and Sexual Activity  . Alcohol use: No  . Drug use: No  . Sexual activity: Not on file  Lifestyle  . Physical activity    Days per week: Not on file    Minutes per session: Not on file  . Stress: Not on file  Relationships  . Social Herbalist on phone: Not on file    Gets together: Not on file    Attends religious service: Not on file    Active member of club or organization: Not on file    Attends meetings of clubs or organizations: Not on file    Relationship status: Not on file  . Intimate partner violence    Fear of current or ex partner: Not on file    Emotionally abused: Not on file    Physically abused: Not on file    Forced sexual activity: Not on file  Other Topics Concern  . Not on file  Social History Narrative  . Not on file     Allergies  Allergen Reactions  . Sulfa Antibiotics Rash     Immunization History  Administered Date(s) Administered  . Fluad Quad(high Dose 65+) 09/04/2019  . Influenza,inj,Quad PF,6+ Mos 11/20/2017  . Influenza-Unspecified 09/29/2016, 10/16/2018, 09/04/2019  . Pneumococcal Polysaccharide-23 09/04/2019  . Pneumococcal-Unspecified 10/03/2016    Facility-Administered Medications Prior to Visit  Medication Dose Route Frequency Provider Last Rate Last Dose  . amitriptyline  (ELAVIL) tablet 75 mg  75 mg Oral QHS Rudean Curt, MD   75 mg at 10/27/19 2225  . aspirin EC tablet 81 mg  81 mg Oral Daily Rudean Curt, MD   81 mg at 10/27/19 2225  . atorvastatin (LIPITOR) tablet 80 mg  80 mg Oral Daily Rudean Curt, MD      . cholecalciferol (VITAMIN D3) tablet 1,000 Units  1,000 Units Oral Daily Rudean Curt, MD   1,000 Units at 10/27/19 2302  . clonazePAM (KLONOPIN) disintegrating tablet 0.25 mg  0.25 mg Oral Daily PRN Rudean Curt, MD      . docusate sodium (COLACE) capsule 100 mg  100 mg Oral TID Rudean Curt, MD  100 mg at 10/27/19 2225  . DULoxetine (CYMBALTA) DR capsule 30 mg  30 mg Oral QHS Rudean Curt, MD   30 mg at 10/27/19 2225  . enoxaparin (LOVENOX) injection 30 mg  30 mg Subcutaneous Q24H Rudean Curt, MD   30 mg at 10/27/19 2227  . finasteride (PROSCAR) tablet 5 mg  5 mg Oral Daily Rudean Curt, MD   5 mg at 10/27/19 2224  . hydroxychloroquine (PLAQUENIL) tablet 200 mg  200 mg Oral QHS Rudean Curt, MD   200 mg at 10/27/19 2225  . insulin aspart protamine- aspart (NOVOLOG MIX 70/30) injection 12 Units  12 Units Subcutaneous Q breakfast Rudean Curt, MD      . insulin aspart protamine- aspart (NOVOLOG MIX 70/30) injection 8 Units  8 Units Subcutaneous QHS Rudean Curt, MD   8 Units at 10/27/19 2302  . methadone (DOLOPHINE) tablet 10 mg  10 mg Oral Q6H Rudean Curt, MD      . methadone (DOLOPHINE) tablet 5 mg  5 mg Oral TID PRN Rudean Curt, MD      . polyethylene glycol [facility] / GLYCOLAX) packet 17 g  17 g Oral QHS Nahser, Wonda Cheng, MD   17 g at 10/27/19 2302  . predniSONE (DELTASONE) tablet 5 mg  5 mg Oral Q breakfast Rudean Curt, MD      . pregabalin (LYRICA) capsule 25 mg  25 mg Oral QHS Rudean Curt, MD   25 mg at 10/27/19 2224  . senna (SENOKOT) tablet 8.6 mg  1 tablet Oral BID Rudean Curt, MD   8.6 mg at 10/27/19 2226  . tamsulosin (FLOMAX) capsule 0.4 mg  0.4 mg Oral QHS Rudean Curt, MD   0.4 mg at 10/27/19 2224   Outpatient Medications Prior to Visit  Medication Sig Dispense Refill  . amitriptyline (ELAVIL) 50 MG tablet TAKE 1 AND 1/2 TABLETS BY MOUTH AT BEDTIME. (Patient taking differently: Take 75 mg by mouth at bedtime. ) 45 tablet 5  . aspirin EC 81 MG tablet Take 81 mg by mouth daily.    [name] atorvastatin (LIPITOR) 80 MG tablet TAKE ONE TABLET BY MOUTH ONCE DAILY. (Patient taking differently: Take 80 mg by mouth daily. ) 30 tablet 5  . blood glucose meter kit and supplies KIT Dispense based on patient and insurance preference. Use up to 3 times daily as directed. DX:E11.9 1 each 0  . Cholecalciferol (VITAMIN D3) 25 MCG (1000 UT) CHEW Chew 1,000 Units by mouth daily.     . clonazePAM (KLONOPIN) 0.25 MG disintegrating tablet DISSOLVE 1 TABLET BY MOUTH DAILY AS NEEDED FOR ANXIETY. (Patient taking differently: Take 0.25 mg by mouth daily as needed (for anxiety- dissolve in the mouth). ) 20 tablet 1  . docusate sodium (COLACE) 100 MG capsule Take 100 mg by mouth 3 (three) times daily.     . DULoxetine (CYMBALTA) 30 MG capsule Take 30 mg by mouth at bedtime.     . finasteride (PROSCAR) 5 MG tablet Take 5 mg by mouth daily.     . hydroxychloroquine (PLAQUENIL) 200 MG tablet Take 200 mg by mouth at bedtime.     . insulin aspart protamine- aspart (NOVOLOG MIX 70/30) (70-30) 100 UNIT/ML injection Inject 12 units into skin in the morning  and 8 units evening (Patient taking differently: Inject 8-12 Units into the skin See admin instructions. Inject 12 units into the skin in the morning and 8 units  in the evening) 10 mL 5  . insulin NPH-regular Human (70-30) 100 UNIT/ML injection Inject 8-12 Units into the skin See admin instructions. 12 units in the morning and 8 units in the evening.    . methadone (DOLOPHINE) 10 MG tablet Take 10 mg by mouth every 6 (six) hours.     .  methadone (DOLOPHINE) 5 MG tablet Take 5 mg by mouth 3 (three) times daily as needed (for breakthrough pain).     . nitroGLYCERIN (NITROSTAT) 0.4 MG SL tablet Place 1 tablet (0.4 mg total) under the tongue every 5 (five) minutes x 3 doses as needed for chest pain. 25 tablet 3  . polyethylene glycol powder (MIRALAX) powder Take 17 g by mouth 2 (two) times daily. (Patient taking differently: Take 17 g by mouth at bedtime. ) 255 g   . Polyvinyl Alcohol-Povidone (REFRESH OP) Place 1 drop into both eyes 3 (three) times daily as needed (for dry eyes).     . predniSONE (DELTASONE) 5 MG tablet TAKE 1 TABLET BY MOUTH ONCE A DAY WITH BREAKFAST. (Patient taking differently: Take 5 mg by mouth daily with breakfast. ) 30 tablet 5  . pregabalin (LYRICA) 25 MG capsule TAKE ONE CAPSULE BY MOUTH AT BEDTIME. (Patient taking differently: Take 25 mg by mouth at bedtime. ) 30 capsule 5  . senna (SENOKOT) 8.6 MG TABS tablet Take 1 tablet by mouth 2 (two) times daily.    . tamsulosin (FLOMAX) 0.4 MG CAPS capsule Take 1 capsule (0.4 mg total) by mouth at bedtime. 30 capsule 5  . torsemide (DEMADEX) 10 MG tablet TAKE 2 TABLETS BY MOUTH EVERY OTHER DAY. (Patient taking differently: Take 20 mg by mouth every other day. ) 30 tablet 5    ROS   Objective:   There were no vitals filed for this visit.   on   RA BMI Readings from Last 3 Encounters:  10/27/19 24.84 kg/m  09/25/19 24.91 kg/m  09/15/19 24.91 kg/m   Wt Readings from Last 3 Encounters:  10/27/19 163 lb 6.4 oz (74.1 kg)  09/15/19 163 lb 12.8 oz (74.3 kg)  09/10/19 175 lb (79.4 kg)    Physical Exam   CBC    Component Value Date/Time   WBC 6.6 10/27/2019 1845   RBC 3.02 (L) 10/27/2019 1845   HGB 9.2 (L) 10/27/2019 1845   HGB 8.7 (L) 09/10/2019 1218   HCT 28.8 (L) 10/27/2019 1845   HCT 25.8 (L) 09/10/2019 1218   PLT 133 (L) 10/27/2019 1845   PLT 158 09/10/2019 1218   MCV 95.4 10/27/2019 1845   MCV 91 09/10/2019 1218   MCH 30.5 10/27/2019 1845    MCHC 31.9 10/27/2019 1845   RDW 13.8 10/27/2019 1845   RDW 13.2 09/10/2019 1218   LYMPHSABS 2.1 09/14/2019 0524   MONOABS 0.6 09/14/2019 0524   EOSABS 0.5 09/14/2019 0524   BASOSABS 0.1 09/14/2019 0524    CHEMISTRY Recent Labs  Lab 10/27/19 1845 10/28/19 0455  NA 135 139  K 4.2 4.5  CL 96* 102  CO2 31 31  GLUCOSE 394* 156*  BUN 21 21  CREATININE 1.39* 1.21  CALCIUM 8.6* 8.3*  MG 2.3  --    Estimated Creatinine Clearance: 55 mL/min (by C-G formula based on SCr of 1.21 mg/dL).  ABG 09/03/2019:  7.36/ 52/ 196/ 29, 100% saturation   Micro: 10/27/2019 Covid negative 10/08/2019 respiratory culture-normal flora 10/08/2019-Candida tropicalis 10/08/2019 AFB pending, smear negative  Chest Imaging- films reviewed: CXR 08/31/2019, 2 view- low  lung volumes.  Right lower lobe opacity with silhouetting of the entire hemidiaphragm.  No obvious pleural effusion.  AICD in place.  Calcified vasculature.  Right lower lobe opacity has been present since July 2020; new since January 2020.  CT chest 10/16/2016- no obvious mediastinal or hilar lymphadenopathy.  Patulous esophagus.  Significant vascular calcifications.  Subpleural pulmonary nodules.  Subpleural scarring in the posterior lateral right lung.  Scarring in bilateral lower lobes with pleural tags.  Consolidation in a bronchovascular pattern in the right lower lobe with air bronchograms.  Elevated right hemidiaphragm.  CT chest 10/10/2019-vairway thickening in upper lobes, prominent bronchiectasis in bilateral lower lobes associated with right lower lobe opacity. Scattered peripheral right upper lobe opacities, patulous esophagus.  No significant mediastinal adenopathy.  Large PA and LA.  Pulmonary Functions Testing Results: No flowsheet data found.    Echocardiogram 07/02/2019: LVEF 60 to 76% with diastolic dysfunction.  Normal left atrium.  Normal RV and RA.  Severe aortic stenosis, mild MR, mild TR. AVA 0.92 cm  Heart  Catheterization 08/29/2019:  Fick Cardiac Output 6.47 L/min  Fick Cardiac Output Index 3.43 (L/min)/BSA  Aortic Mean Gradient 30.95 mmHg  Aortic Peak Gradient 38 mmHg  Aortic Valve Area 1.14  Aortic Value Area Index 0.6 cm2/BSA  RA A Wave 3 mmHg  RA V Wave 2 mmHg  RA Mean 0 mmHg  RV Systolic Pressure 20 mmHg  RV Diastolic Pressure -2 mmHg  RV EDP 1 mmHg  PA Systolic Pressure 23 mmHg  PA Diastolic Pressure 3 mmHg  PA Mean 11 mmHg  PW A Wave 3 mmHg  PW V Wave 6 mmHg  PW Mean 2 mmHg  AO Systolic Pressure 720 mmHg  AO Diastolic Pressure 51 mmHg  AO Mean 73 mmHg  LV Systolic Pressure 947 mmHg  LV Diastolic Pressure 6 mmHg  LV EDP 9 mmHg  AOp Systolic Pressure 096 mmHg  AOp Diastolic Pressure 46 mmHg  AOp Mean Pressure 73 mmHg  LVp Systolic Pressure 283 mmHg  LVp Diastolic Pressure 8 mmHg  LVp EDP Pressure 16 mmHg  QP/QS 1  TPVR Index 3.21 HRUI  TSVR Index 21.27 HRUI  TPVR/TSVR Ratio 0.15   Heart catheterization 09/03/2019   Severe 4-vessel native CAD with 100% flush CTO of the LAD, RI and RCA with 60% stenosis of ostial OM1 and ostial PDA (fill via SVG-PDA)  Widely patent LIMA-LAD, SVG-RI with moderate ostial and proximal disease in SVG-RPDA (retrograde flow from RPDA 2 RPL system somewhat jeopardized by ostial 75% PDA lesion-not PCI target)  Echocardiographically Severe Aortic Stenosis -> with direct measurements of P-P pressure 38 million mercury and mean 31 mmHg.  Normal CARDIAC OUTPUT & CARDIAC INDEX  Relatively normal RIGHT HEART CATH pressures    Assessment & Plan:     ICD-10-CM   1. Bronchiectasis with acute lower respiratory infection ([place])  J47.0 Ambulatory referral to Infectious Disease  2. Candida tropicalis infection  B37.9 Ambulatory referral to Infectious Disease  3. Rheumatoid arthritis involving multiple sites with positive rheumatoid factor (HCC)  M05.79      Bilateral lower lobe bronchiectasis, most prominent in the right lower lobe associated with  peripheral opacification.  Likely this is a sequela of previous aspirations and/or infections.  Bronchiectasis is responsible for his chronic productive cough.  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  No current facility-administered medications for this visit.  No current outpatient medications on file.  Facility-Administered Medications Ordered in Other Visits:  .  0.9 %  sodium chloride infusion, , Intravenous, Continuous, Rudean Curt, MD, Last Rate: 75 mL/hr at 10/27/19 2231 .  amitriptyline (ELAVIL) tablet 75 mg, 75 mg, Oral, QHS, Rudean Curt, MD, 75 mg at 10/27/19 2225 .  aspirin EC tablet 81 mg, 81 mg, Oral, Daily, Rudean Curt, MD, 81 mg at 10/27/19 2225 .  atorvastatin (LIPITOR) tablet 80 mg, 80 mg, Oral, Daily, Rudean Curt, MD .  cholecalciferol (VITAMIN D3) tablet 1,000 Units, 1,000 Units, Oral, Daily, Rudean Curt, MD, 1,000 Units at 10/27/19 2302 .  clonazePAM (KLONOPIN) disintegrating tablet 0.25 mg, 0.25 mg, Oral, Daily PRN, Rudean Curt, MD .  docusate sodium (COLACE) capsule 100 mg, 100 mg, Oral, TID, Rudean Curt, MD, 100 mg at 10/27/19 2225 .  DULoxetine (CYMBALTA) DR capsule 30 mg, 30 mg, Oral, QHS, Rudean Curt, MD, 30 mg at 10/27/19 2225 .  enoxaparin (LOVENOX) injection 30 mg, 30 mg, Subcutaneous, Q24H, Rudean Curt, MD, 30 mg at 10/27/19 2227 .  finasteride (PROSCAR) tablet 5 mg, 5 mg, Oral, Daily, Rudean Curt, MD, 5 mg at 10/27/19 2224 .  hydroxychloroquine (PLAQUENIL) tablet  200 mg, 200 mg, Oral, QHS, Rudean Curt, MD, 200 mg at 10/27/19 2225 .  insulin aspart protamine- aspart (NOVOLOG MIX 70/30) injection 12 Units, 12 Units, Subcutaneous, Q breakfast, Rudean Curt, MD .  insulin aspart protamine- aspart (NOVOLOG MIX 70/30) injection 8 Units, 8 Units, Subcutaneous, QHS, Rudean Curt, MD, 8 Units at 10/27/19 2302 .  methadone (DOLOPHINE) tablet 10 mg, 10 mg, Oral, Q6H, Rudean Curt, MD .  methadone (DOLOPHINE) tablet 5 mg, 5 mg, Oral, TID PRN, Rudean Curt, MD .  polyethylene glycol [facility] / GLYCOLAX) packet 17 g, 17 g, Oral, QHS, Nahser, Wonda Cheng, MD, 17 g at 10/27/19 2302 .  predniSONE (DELTASONE) tablet 5 mg, 5 mg, Oral, Q breakfast, Rudean Curt, MD .  pregabalin (LYRICA) capsule 25 mg, 25 mg, Oral, QHS, Rudean Curt, MD, 25 mg at 10/27/19 2224 .  senna (SENOKOT) tablet 8.6 mg, 1 tablet, Oral, BID, Rudean Curt, MD, 8.6 mg at 10/27/19 2226 .  tamsulosin (FLOMAX) capsule 0.4 mg, 0.4 mg, Oral, QHS, Rudean Curt, MD, 0.4 mg at 10/27/19 2224   Julian Hy, DO [facility] 10/28/2019 8:50 AM

## 2019-10-28 NOTE — Discharge Summary (Addendum)
Discharge Summary    Patient ID: [name],  MRN: [id number], DOB/AGE: 70/12/1950 70 y.o.  Admit date: 10/27/2019 Discharge date: 10/28/2019  Primary Care Provider: [name] Primary Cardiologist: Carlyle Dolly, MD  Discharge Diagnoses    Active Problems:   Aortic stenosis   Allergies Allergies  Allergen Reactions   Sulfa Antibiotics Rash    Diagnostic Studies/Procedures    CT imaging for TAVR work up. _____________   History of Present Illness     [name] is a 68 y.o. male with a history of CAD s/p CABG x3 in 06/2015, chronic combined CHF with EF as low as 30-35% but improved to 60-65% on Echo in 06/2019, severe aortic stenosis, hypertension, diabetes mellitus, post-op atrial fibrillation following CABG with no recurrence (not on anticoagulation), PAD s/p left below the knee amputation, CKD stage III, chronic back/neck pain, and rheumatoid arthritis who is followed by Dr. [name].    Patient was admitted to [facility] in 06/2019 for chest pain and was found to be hypotensive and bradycardic. He was treated with atropine, bicarb, epinephrine, and fluid resuscitation with good response. Echo on 07/02/2019 showed LVEF of 60-65% with thickened/calcified aortic valve leaflet with limited mobility. The mean gradient is 16.8 mmHg, peak gradient 27.5 mmHg, AVA 0.79 cm2, dimensionless index 0.29. Patient also noted to have AK with creatinine peaking at 3.28 (improved to 1.65 at discharge) and elevated LFTs. Outpatient stress test was peformed on 07/10/2019 and showed medium defect of mild severity present in the mid inferoseptal and mid inferior location. Therewas signficant gut uptake whichwas likely causing this defectand no large ischemic territorieswere noted. Was read as intermediate-riskdue to reduced EF but this was normal by recent echocardiogram.    He was seen by Dr. Angelena Form in our [facility] on 08/29/2019 to discuss possible TAVR. At that  visit, he reported chest pain concerning for unstable angina. Patient ended up being sent over the the ED for admission by Cardiology team. EKG on arrival showed complete heart rate with junctional escape at 36 bpm. He underwent temporary pacemaker on 08/29/2019 and then had permanent pacemaker place on 08/30/2019. He tolerated this well. No pneumothorax on chest x-ray the next day. He underwent right/left heart cath on 09/03/2019 which showed patent LIMA-LAD, SVG-RI with moderate ostial and proximal disease in the SVG-RPDA not a good PCI target. No intervention. Normal right heart cath pressures and normal cardiac output. Continued TAVR work up as outpatient was recommended. Following cardiac catheterization he was noted to have bleeding at [facility] site. EP re-evaluated patient and noted soft hematoma with some bleeding. Manual pressure and pressure dressing were applied.   He was last seen in cardiology clinic on 09/10/2019 at which time patient's biggest complaint was a productive cough. He was reported mild increased shortness of breath and lower extremity edema and was up 12 lbs since discharge. Labs at that visit showed acute renal failure with creatinine of 3.19 (up from 1.67 on recent discharge) and BNP of > 15,000. He was advised to go to the ED and was admitted for acute renal failure. Urinalysis on admission consistent with UTI and urine culture positive for pseudomonas aeruginosa. Patient treated with antibiotics. Renal ultrasound showed shadowing calcifications within the kidneys but no evidence of hydronephrosis. Nephrology was consulted and recommended holding home Torsemide and encouraging oral hydration. AKI felt to be due to IV contrast. Creatinine improved to 1.95 on discharge and patient was advised to follow-up with Nephrology.  Patient was seen by Nephrology on 10/10/2019 at which time he was doing well. Nephrology discussed with patient need for further contrast for TAVR work-up and possible for  renal replacement therapy. Nephrology recommended holding home Torsemide one day prior to CT scans and IV NS 1 ml/kg/hr for 6-12 hours pre procedure and post procedure as long as patient has no edema and is not in fluid overload. He was admitted on 10/27/19 for pre-hydration.  Hospital Course    Creatine remained stable at 1.3>>1.21 prior to CT imaging. No complications noted. Given IV hydration post imaging. No signs or symptoms of volume overload noted. He was discharged with plans to keep follow up appts regarding his TAVR work up. Instructed to resume torsemide on 10/30/19.    [name] was seen by Dr. Claiborne Billings and determined stable for discharge home. Follow up in the office has been arranged. Medications are listed below.   _____________  Discharge Vitals Blood pressure (!) 150/81, pulse 75, temperature 97.8 F (36.6 C), temperature source Oral, resp. rate 15, height 5' 8" (1.727 m), weight 74.1 kg, SpO2 99 %.  Filed Weights   10/27/19 1800  Weight: 74.1 kg    Labs & Radiologic Studies    CBC Recent Labs    10/27/19 1845  WBC 6.6  HGB 9.2*  HCT 28.8*  MCV 95.4  PLT 774*   Basic Metabolic Panel Recent Labs    10/27/19 1845 10/28/19 0455  NA 135 139  K 4.2 4.5  CL 96* 102  CO2 31 31  GLUCOSE 394* 156*  BUN 21 21  CREATININE 1.39* 1.21  CALCIUM 8.6* 8.3*  MG 2.3  --    Liver Function Tests No results for input(s): AST, ALT, ALKPHOS, BILITOT, PROT, ALBUMIN in the last 72 hours. No results for input(s): LIPASE, AMYLASE in the last 72 hours. Cardiac Enzymes No results for input(s): CKTOTAL, CKMB, CKMBINDEX, TROPONINI in the last 72 hours. BNP Invalid input(s): POCBNP D-Dimer No results for input(s): DDIMER in the last 72 hours. Hemoglobin A1C No results for input(s): HGBA1C in the last 72 hours. Fasting Lipid Panel No results for input(s): CHOL, HDL, LDLCALC, TRIG, CHOLHDL, LDLDIRECT in the last 72 hours. Thyroid Function Tests No results for input(s): TSH,  T4TOTAL, T3FREE, THYROIDAB in the last 72 hours.  Invalid input(s): FREET3 _____________  Ct Chest Wo Contrast  Result Date: 10/11/2019 CLINICAL DATA:  Evaluation of a right lower lobe opacity seen on radiographs, chronic cough EXAM: CT CHEST WITHOUT CONTRAST TECHNIQUE: Multidetector CT imaging of the chest was performed following the standard protocol without IV contrast. COMPARISON:  Chest radiograph 09/11/2019, CT chest 10/16/2016 FINDINGS: Cardiovascular: Pacer pack overlies the left chest wall with leads at the right atrium and cardiac apex. Mild cardiomegaly. There is extensive calcification of the native coronary arteries and postsurgical changes from prior CABG. The aorta is calcified but remains normal caliber. Normal 3 vessel branching with calcifications in the proximal great vessels. Central pulmonary arteries are borderline enlarged which may reflect chronic pulmonary artery hypertension. Luminal evaluation precluded in the absence of contrast. Mediastinum/Nodes: No enlarged mediastinal or axillary lymph nodes. Thyroid gland, trachea, and esophagus demonstrate no significant findings. Lungs/Pleura: Scarring and architectural distortion is present in the right lower lobe corresponding to the radiographic abnormality. There is associated traction bronchiectasis in the right lower lobe and milder changes in the left lower lobe as well. Overall, these features are diminished from comparison CT dated 10/16/2016. Additional dependent atelectasis is seen posteriorly in both  lungs. More bandlike areas of opacity in the left lower lobe could reflect additional scarring and/or subsegmental atelectasis. No suspicious pulmonary nodules or masses. Upper Abdomen: Extensive atherosclerotic calcifications are noted throughout the upper abdomen. Multiple calcified gallstones layer dependently within the gallbladder. Borderline splenomegaly. Moderate stool burden. Musculoskeletal: Post sternotomy changes. Slight  exaggeration of the thoracic kyphosis with multilevel degenerative changes in the spine. Severe arthrosis is noted in both shoulders with subchondral cystic change in articular surface collapse of both humeral heads. High-riding appearance of the humeri likely reflect superimposed rotator cuff insufficiency. IMPRESSION: 1. Scarring, architectural distortion and bronchiectasis in the right lower lobe corresponding to the radiographic abnormality, overall diminished from comparison CT dated 10/16/2016. Findings are favored to reflect sequela of prior infection/inflammation. 2. Central pulmonary artery enlargement compatible with chronic pulmonary artery hypertension. 3. Postsurgical changes from prior CABG. Extensive calcification of the native coronary arteries. 4. Aortic Atherosclerosis (ICD10-I70.0). 5. Cholelithiasis. 6. Borderline splenomegaly. 7. Severe arthrosis in both shoulders with subchondral cystic change in articular surface collapse of both humeral heads. High-riding appearance of the humeri likely reflect superimposed rotator cuff insufficiency. Electronically Signed   By: Lovena Le M.D.   On: 10/11/2019 04:03   Ct Coronary Morph W/cta Cor W/score W/ca W/cm &/or Wo/cm  Result Date: 10/28/2019 EXAM: OVER-READ INTERPRETATION  CT CHEST The following report is an over-read performed by radiologist Dr. Aletta Edouard of [facility], [place] on 10/28/2019. This over-read does not include interpretation of cardiac or coronary anatomy or pathology. The coronary CTA interpretation by the cardiologist is attached. COMPARISON:  CT of the chest without contrast on 10/10/2019 FINDINGS: Vascular: Calcified aortic valve and aortic root. Prior CABG and pacemaker placement. Mediastinum/Nodes: No enlarged mediastinal or axillary lymph nodes. Thyroid gland, trachea, and esophagus demonstrate no significant findings. Lungs/Pleura: Stable scarring and atelectasis in the posterior right lower lobe with adjacent  pleural thickening and trace pleural effusion as well as some pleural calcifications. There is no evidence of pulmonary edema, consolidation, pneumothorax, nodule or pleural fluid. Upper Abdomen: No acute abnormality. Musculoskeletal: No chest wall mass or suspicious bone lesions identified. IMPRESSION: No significant incidental findings. Stable scarring and atelectasis in the right lower lobe with adjacent trace pleural effusion and pleural calcifications. Electronically Signed   By: Aletta Edouard M.D.   On: 10/28/2019 11:20   Ct Angio Chest Aorta W/cm &/or Wo/cm  Result Date: 10/28/2019 CLINICAL DATA:  Inpatient. Severe symptomatic aortic stenosis. Pre-TAVR evaluation. EXAM: CT ANGIOGRAPHY CHEST, ABDOMEN AND PELVIS TECHNIQUE: Multidetector CT imaging through the chest, abdomen and pelvis was performed using the standard protocol during bolus administration of intravenous contrast. Multiplanar reconstructed images and MIPs were obtained and reviewed to evaluate the vascular anatomy. CONTRAST:  2m OMNIPAQUE IOHEXOL 350 MG/ML SOLN COMPARISON:  10/10/2019 chest CT.  10/15/2016 CT abdomen/pelvis. FINDINGS: CTA CHEST FINDINGS Cardiovascular: Mild cardiomegaly. No significant pericardial effusion/thickening. Marked thickening and calcification of the aortic valve. 2 lead left subclavian pacemaker with lead tips in right atrium and right ventricle. Three-vessel coronary atherosclerosis status post CABG. Atherosclerotic nonaneurysmal thoracic aorta. Normal caliber pulmonary arteries. No central pulmonary emboli. Mediastinum/Nodes: No discrete thyroid nodules. Unremarkable esophagus. No pathologically enlarged axillary, mediastinal or hilar lymph nodes. Lungs/Pleura: No pneumothorax. No left pleural effusion. Trace dependent right pleural effusion with smooth mild right pleural thickening and scattered right pleural calcification, unchanged. Low lung volumes. Stable mild elevation of the right hemidiaphragm. Thick  platelike consolidation with associated volume loss in the dependent basilar right lower lobe, not appreciably changed back  to 10/16/2016 chest CT, compatible with nonspecific postinfectious/postinflammatory scarring. No acute consolidative airspace disease, lung masses or significant pulmonary nodules. Mild mosaic attenuation throughout both lungs. Musculoskeletal: No aggressive appearing focal osseous lesions. Intact sternotomy wires. Severe bilateral glenohumeral osteoarthritis. Mild thoracic spondylosis. CTA ABDOMEN AND PELVIS FINDINGS Hepatobiliary: Normal liver with no liver mass. Cholelithiasis. No biliary ductal dilatation. Pancreas: Normal, with no mass or duct dilation. Spleen: Normal size. No mass. Adrenals/Urinary Tract: Normal adrenals. No hydronephrosis. Exophytic hypodense 1.5 cm renal cortical lesion in the interpolar left kidney (series 7/ image 118), stable in size since 10/15/2016, presumably a Bosniak category 2 renal cyst. No additional contour deforming renal masses. Bladder obscured by streak artifact from left hip hardware. No significant bladder distention. Chronic diffuse bladder wall thickening is unchanged. Stomach/Bowel: Normal non-distended stomach. Normal caliber small bowel with no small bowel wall thickening. Normal appendix. Normal large bowel with no diverticulosis, large bowel wall thickening or pericolonic fat stranding. Vascular/Lymphatic: Atherosclerotic nonaneurysmal abdominal aorta. Patent renal and splenic veins. No pathologically enlarged lymph nodes in the abdomen or pelvis. Reproductive: Top-normal size prostate. Other: No pneumoperitoneum, ascites or focal fluid collection. Small fat containing periumbilical hernia, unchanged. Musculoskeletal: No aggressive appearing focal osseous lesions. Left total hip arthroplasty. Marked lumbar spondylosis, most prominent at L3-4. VASCULAR MEASUREMENTS PERTINENT TO TAVR: AORTA: Minimal Aortic Diameter-13.5 x 11.9 mm Severity of  Aortic Calcification-moderate to severe RIGHT PELVIS: Right Common Iliac Artery - Minimal Diameter-9.8 x 8.0 mm Tortuosity-mild Calcification-moderate Right External Iliac Artery - Minimal Diameter-7.6 x 7.1 mm Tortuosity-mild-to-moderate Calcification-mild Right Common Femoral Artery - Minimal Diameter-6.2 x 5.7 mm Tortuosity-mild Calcification-moderate LEFT PELVIS: Left Common Iliac Artery - Minimal Diameter-9.1 x 7.3 mm Tortuosity-mild Calcification-moderate to severe Left External Iliac Artery - Minimal Diameter-7.5 x 6.6 mm Tortuosity-mild-to-moderate Calcification-mild Left Common Femoral Artery - Minimal Diameter-7.0 x 5.8 mm Tortuosity-mild Calcification-moderate Review of the MIP images confirms the above findings. IMPRESSION: 1. Vascular findings and measurements pertinent to potential TAVR procedure, as detailed. 2. Marked thickening and calcification of the aortic valve, compatible with the reported history of severe symptomatic aortic stenosis. 3. Cardiomegaly. 4. Chronic trace loculated dependent right pleural effusion with smooth right pleural thickening and platelike postinfectious/postinflammatory scarring at the dependent right lung base. 5. Cholelithiasis. 6. Chronic diffuse bladder wall thickening, nonspecific. No hydronephrosis. 7. Aortic Atherosclerosis (ICD10-I70.0). Electronically Signed   By: Ilona Sorrel M.D.   On: 10/28/2019 12:34   Ct Angio Abd/pel W/ And/or W/o  Result Date: 10/28/2019 CLINICAL DATA:  Inpatient. Severe symptomatic aortic stenosis. Pre-TAVR evaluation. EXAM: CT ANGIOGRAPHY CHEST, ABDOMEN AND PELVIS TECHNIQUE: Multidetector CT imaging through the chest, abdomen and pelvis was performed using the standard protocol during bolus administration of intravenous contrast. Multiplanar reconstructed images and MIPs were obtained and reviewed to evaluate the vascular anatomy. CONTRAST:  66m OMNIPAQUE IOHEXOL 350 MG/ML SOLN COMPARISON:  10/10/2019 chest CT.  10/15/2016 CT  abdomen/pelvis. FINDINGS: CTA CHEST FINDINGS Cardiovascular: Mild cardiomegaly. No significant pericardial effusion/thickening. Marked thickening and calcification of the aortic valve. 2 lead left subclavian pacemaker with lead tips in right atrium and right ventricle. Three-vessel coronary atherosclerosis status post CABG. Atherosclerotic nonaneurysmal thoracic aorta. Normal caliber pulmonary arteries. No central pulmonary emboli. Mediastinum/Nodes: No discrete thyroid nodules. Unremarkable esophagus. No pathologically enlarged axillary, mediastinal or hilar lymph nodes. Lungs/Pleura: No pneumothorax. No left pleural effusion. Trace dependent right pleural effusion with smooth mild right pleural thickening and scattered right pleural calcification, unchanged. Low lung volumes. Stable mild elevation of the right hemidiaphragm. Thick platelike consolidation with associated  volume loss in the dependent basilar right lower lobe, not appreciably changed back to 10/16/2016 chest CT, compatible with nonspecific postinfectious/postinflammatory scarring. No acute consolidative airspace disease, lung masses or significant pulmonary nodules. Mild mosaic attenuation throughout both lungs. Musculoskeletal: No aggressive appearing focal osseous lesions. Intact sternotomy wires. Severe bilateral glenohumeral osteoarthritis. Mild thoracic spondylosis. CTA ABDOMEN AND PELVIS FINDINGS Hepatobiliary: Normal liver with no liver mass. Cholelithiasis. No biliary ductal dilatation. Pancreas: Normal, with no mass or duct dilation. Spleen: Normal size. No mass. Adrenals/Urinary Tract: Normal adrenals. No hydronephrosis. Exophytic hypodense 1.5 cm renal cortical lesion in the interpolar left kidney (series 7/ image 118), stable in size since 10/15/2016, presumably a Bosniak category 2 renal cyst. No additional contour deforming renal masses. Bladder obscured by streak artifact from left hip hardware. No significant bladder distention.  Chronic diffuse bladder wall thickening is unchanged. Stomach/Bowel: Normal non-distended stomach. Normal caliber small bowel with no small bowel wall thickening. Normal appendix. Normal large bowel with no diverticulosis, large bowel wall thickening or pericolonic fat stranding. Vascular/Lymphatic: Atherosclerotic nonaneurysmal abdominal aorta. Patent renal and splenic veins. No pathologically enlarged lymph nodes in the abdomen or pelvis. Reproductive: Top-normal size prostate. Other: No pneumoperitoneum, ascites or focal fluid collection. Small fat containing periumbilical hernia, unchanged. Musculoskeletal: No aggressive appearing focal osseous lesions. Left total hip arthroplasty. Marked lumbar spondylosis, most prominent at L3-4. VASCULAR MEASUREMENTS PERTINENT TO TAVR: AORTA: Minimal Aortic Diameter-13.5 x 11.9 mm Severity of Aortic Calcification-moderate to severe RIGHT PELVIS: Right Common Iliac Artery - Minimal Diameter-9.8 x 8.0 mm Tortuosity-mild Calcification-moderate Right External Iliac Artery - Minimal Diameter-7.6 x 7.1 mm Tortuosity-mild-to-moderate Calcification-mild Right Common Femoral Artery - Minimal Diameter-6.2 x 5.7 mm Tortuosity-mild Calcification-moderate LEFT PELVIS: Left Common Iliac Artery - Minimal Diameter-9.1 x 7.3 mm Tortuosity-mild Calcification-moderate to severe Left External Iliac Artery - Minimal Diameter-7.5 x 6.6 mm Tortuosity-mild-to-moderate Calcification-mild Left Common Femoral Artery - Minimal Diameter-7.0 x 5.8 mm Tortuosity-mild Calcification-moderate Review of the MIP images confirms the above findings. IMPRESSION: 1. Vascular findings and measurements pertinent to potential TAVR procedure, as detailed. 2. Marked thickening and calcification of the aortic valve, compatible with the reported history of severe symptomatic aortic stenosis. 3. Cardiomegaly. 4. Chronic trace loculated dependent right pleural effusion with smooth right pleural thickening and platelike  postinfectious/postinflammatory scarring at the dependent right lung base. 5. Cholelithiasis. 6. Chronic diffuse bladder wall thickening, nonspecific. No hydronephrosis. 7. Aortic Atherosclerosis (ICD10-I70.0). Electronically Signed   By: Ilona Sorrel M.D.   On: 10/28/2019 12:34   Disposition   Pt is being discharged home today in good condition.  Follow-up Plans & Appointments    Follow-up Information    Rexene Alberts, MD Follow up on 11/04/2019.   Specialty: Cardiothoracic Surgery Why: at 12:30pm for your follow up appt.  Contact information: [address] [place] [id number] [phone]          Discharge Instructions    Diet - low sodium heart healthy   Complete by: As directed    Discharge instructions   Complete by: As directed    Please keep all follow up appts for your upcoming valve surgery   Increase activity slowly   Complete by: As directed        Discharge Medications     Medication List    TAKE these medications   amitriptyline 50 MG tablet Commonly known as: ELAVIL TAKE 1 AND 1/2 TABLETS BY MOUTH AT BEDTIME. What changed:   how much to take  how to take  this  when to take this  additional instructions   aspirin EC 81 MG tablet Take 81 mg by mouth daily.   atorvastatin 80 MG tablet Commonly known as: LIPITOR TAKE ONE TABLET BY MOUTH ONCE DAILY.   blood glucose meter kit and supplies Kit Dispense based on patient and insurance preference. Use up to 3 times daily as directed. DX:E11.9   clonazePAM 0.25 MG disintegrating tablet Commonly known as: KLONOPIN DISSOLVE 1 TABLET BY MOUTH DAILY AS NEEDED FOR ANXIETY. What changed:   how much to take  how to take this  when to take this  reasons to take this  additional instructions   docusate sodium 100 MG capsule Commonly known as: COLACE Take 100 mg by mouth 3 (three) times daily.   DULoxetine 30 MG capsule Commonly known as: CYMBALTA Take 30 mg by mouth at  bedtime.   finasteride 5 MG tablet Commonly known as: PROSCAR Take 5 mg by mouth daily.   hydroxychloroquine 200 MG tablet Commonly known as: PLAQUENIL Take 200 mg by mouth at bedtime.   insulin aspart protamine- aspart (70-30) 100 UNIT/ML injection Commonly known as: NOVOLOG MIX 70/30 Inject 12 units into skin in the morning  and 8 units evening What changed:   how much to take  how to take this  when to take this  additional instructions   methadone 10 MG tablet Commonly known as: DOLOPHINE Take 10 mg by mouth every 6 (six) hours.   methadone 5 MG tablet Commonly known as: DOLOPHINE Take 5 mg by mouth 3 (three) times daily as needed (for breakthrough pain).   nitroGLYCERIN 0.4 MG SL tablet Commonly known as: NITROSTAT Place 1 tablet (0.4 mg total) under the tongue every 5 (five) minutes x 3 doses as needed for chest pain.   polyethylene glycol powder 17 GM/SCOOP powder Commonly known as: MiraLax Take 17 g by mouth 2 (two) times daily. What changed: when to take this   predniSONE 5 MG tablet Commonly known as: DELTASONE TAKE 1 TABLET BY MOUTH ONCE A DAY WITH BREAKFAST. What changed: See the new instructions.   pregabalin 25 MG capsule Commonly known as: LYRICA TAKE ONE CAPSULE BY MOUTH AT BEDTIME.   REFRESH OP Place 1 drop into both eyes 3 (three) times daily as needed (for dry eyes).   senna 8.6 MG Tabs tablet Commonly known as: SENOKOT Take 1 tablet by mouth 2 (two) times daily.   tamsulosin 0.4 MG Caps capsule Commonly known as: FLOMAX Take 1 capsule (0.4 mg total) by mouth at bedtime.   torsemide 10 MG tablet Commonly known as: DEMADEX TAKE 2 TABLETS BY MOUTH EVERY OTHER DAY. What changed:   how much to take  how to take this  when to take this  additional instructions Notes to patient: Please do not restart until 10/30/19!!!   Vitamin D3 25 MCG (1000 UT) Chew Chew 1,000 Units by mouth daily.                                     Did  the patient have a percutaneous coronary intervention (stent / angioplasty)?:  No.      Outstanding Labs/Studies   Ongoing TAVR work up.   Duration of Discharge Encounter   Greater than 30 minutes including physician time.  Signed, Reino Bellis NP-C 10/28/2019, 2:53 PM    Patient seen and examined. Agree with assessment and plan. Feels well,  getting hydrated post CT imaging studies. Currently getting carotid duplex.  No signs of volume overload. 3/6 mid late peaking AS murmur. No edema; s/p LBKA. DC today.   Troy Sine, MD, [facility] 10/28/2019 3:23 PM

## 2019-10-28 NOTE — Progress Notes (Signed)
Discharge orders written. Instructions reviewed with patient and wife. All questions answered. Pt discharged via his personal wheelchair in stable condition into the care of his wife.

## 2019-10-29 NOTE — Progress Notes (Signed)
  [facility]   MULTIDISCIPLINARY HEART VALVE TEAM   Pt currently undergoing TAVR evaluation. Pre TAVR PT evaluation not completed due to pt's limited mobility due to Left leg below the knee amputation, rheumatoid arthritis and neuropathy. The pt is wheelchair bound.

## 2019-10-30 NOTE — Progress Notes (Signed)
Synopsis: Referred in September 2020 for productive cough by [name], MD.  Subjective:   PATIENT ID: [name] GENDER: male DOB: June 10, 1951, MRN: [id number]  Chief Complaint  Patient presents with   Follow-up    [name] is a 67 year old gentleman with a history of rheumatoid arthritis, HFpEF, CAD, severe aortic stenosis who was recently admitted to the hospital for pre-TAVR evaluation.  Who presents for follow-up for chronic productive cough for the last 2 years.  He continues to have copious sputum throughout the day, worse in the morning.  Sputum is so thick that he has trouble clearing it from his throat.  He has not tried inhalers or Mucinex.  His breathing has been at baseline recently.  No fevers, chills, sweats, or other new symptoms.  No chest pain.  He has minimal leg edema, and elevates his leg at night.     OV 09/25/2019: [name] is a 68 year old gentleman with a history of severe coronary artery disease requiring four-vessel CABG in 2016, HFpEF, severe aortic stenosis, CKD, and rheumatoid arthritis currently on prednisone 57m and hydroxychloroquine who presents for evaluation of chronic cough with sputum production for past 2 years.  This started at a random time, not associated with an infection.  It has not worsened over time.  His sputum is usually gray, but sometimes green.  He expectorates sputum throughout the day, sometimes 1-2 times a day, sometimes 4 or more times per day.  He has had pneumonias in the past, but  no significant childhood lung infections.  He has no previous personal or family history of lung disease.  He has no history of cancer; he is a never smoker.  Although his rheumatoid arthritis regimen is minimal, in the past he has been on Remicade, methotrexate, and possibly other medications for more aggressive control.  He has had infectious complications in the past, limiting his ability to tolerate more immunosuppression.  He has chronic  hoarseness, which he reports he was [place] by [place] in [place] where he was told he had "weak vocal cords".  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  Otherwise his health is at baseline.  He is able to ambulate with a left lower extremity prosthesis, and he has a motorized wheelchair.  His wife assists him due to disfigurement from rheumatoid arthritis.   Past Medical History:  Diagnosis Date   Aortic stenosis    ARF (acute renal failure) ([facility] 09/11/2019   Chronic back pain    Chronic combined systolic (congestive) and diastolic (congestive) heart failure ([facility]    a. EF 30-35% in 2016 b. at 30-35% by echo in 08/2016 c. 06/2019: echo showing EF of 60-65% but found to have severe AS.    Chronic neck pain    CKD (chronic kidney disease), stage III    Complete heart block ([facility] 08/29/2019   a. s/p Medtronic dual chamber pacemeker implantation on 08/30/2019.   Coronary artery disease    a. s/p CABG in 06/2015 with LIMA-LAD, SVG-PDA, SVG-Intermediate   Diabetes mellitus    Diabetic neuropathy (HCC)    Hyperkalemia    Hypertension    Loculated pleural effusion 09/09/2016   Pancytopenia ([facility] 08/13/2014   Prolonged QT interval 08/14/2014   Possibly secondary to methadone and amitriptyline.   Rheumatoid arthritis(714.0)    Seizure (  [place]) 08/13/2014   Pt denies     Family History  Problem Relation Age of Onset   Diabetes Father    Alzheimer's disease Father    Heart attack Brother        multiple brothers with MIs   COPD Brother    Hypertension Mother    Stroke Mother    Head & neck cancer Nephew      Past Surgical History:  Procedure Laterality Date   BELOW KNEE LEG AMPUTATION Left 06/216   left leg   CARDIAC SURGERY     FRACTURE SURGERY     MUSCLE BIOPSY  06/2016   PACEMAKER IMPLANT  N/A 08/30/2019   Procedure: PACEMAKER IMPLANT;  Surgeon: Evans Lance, MD;  Location: [facility];  Service: Cardiovascular;  Laterality: N/A;   right foot surgery-Toe amputations     RIGHT/LEFT HEART CATH AND CORONARY/GRAFT ANGIOGRAPHY N/A 09/03/2019   Procedure: RIGHT/LEFT HEART CATH AND CORONARY/GRAFT ANGIOGRAPHY;  Surgeon: Leonie Man, MD;  Location: [facility];  Service: Cardiovascular;  Laterality: N/A;   TEMPORARY PACEMAKER N/A 08/29/2019   Procedure: TEMPORARY PACEMAKER;  Surgeon: Sherren Mocha, MD;  Location: [facility];  Service: Cardiovascular;  Laterality: N/A;   TOTAL HIP ARTHROPLASTY Left     Social History   Socioeconomic History   Marital status: Married    Spouse name: Not on file   Number of children: 2   Years of education: Not on file   Highest education level: Not on file  Occupational History   Occupation: Retired Programme researcher, broadcasting/film/video strain: Not on file   Food insecurity    Worry: Not on file    Inability: Not on file   Transportation needs    Medical: Not on file    Non-medical: Not on file  Tobacco Use   Smoking status: Never Smoker   Smokeless tobacco: Never Used  Substance and Sexual Activity   Alcohol use: No   Drug use: No   Sexual activity: Yes  Lifestyle   Physical activity    Days per week: Not on file    Minutes per session: Not on file   Stress: Not on file  Relationships   Social connections    Talks on phone: Not on file    Gets together: Not on file    Attends religious service: Not on file    Active member of club or organization: Not on file    Attends meetings of clubs or organizations: Not on file    Relationship status: Not on file   Intimate partner violence    Fear of current or ex partner: Not on file    Emotionally abused: Not on file    Physically abused: Not on file    Forced sexual activity: Not on file  Other Topics Concern   Not on file    Social History Narrative   Not on file     Allergies  Allergen Reactions   Sulfa Antibiotics Rash     Immunization History  Administered Date(s) Administered   Fluad Quad(high Dose 65+) 09/04/2019   Influenza,inj,Quad PF,6+ Mos 11/20/2017   Influenza-Unspecified 09/29/2016, 10/16/2018, 09/04/2019   Pneumococcal Polysaccharide-23 09/04/2019   Pneumococcal-Unspecified 10/03/2016    Outpatient Medications Prior to Visit  Medication Sig Dispense Refill   amitriptyline (ELAVIL) 50 MG tablet TAKE 1 AND 1/2 TABLETS BY MOUTH AT BEDTIME. (Patient taking differently: Take 75 mg by mouth at bedtime. )  45 tablet 5   aspirin EC 81 MG tablet Take 81 mg by mouth daily.     atorvastatin (LIPITOR) 80 MG tablet TAKE ONE TABLET BY MOUTH ONCE DAILY. (Patient taking differently: Take 80 mg by mouth daily. ) 30 tablet 5   blood glucose meter kit and supplies KIT Dispense based on patient and insurance preference. Use up to 3 times daily as directed. DX:E11.9 1 each 0   Cholecalciferol (VITAMIN D3) 25 MCG (1000 UT) CHEW Chew 1,000 Units by mouth daily.      clonazePAM (KLONOPIN) 0.25 MG disintegrating tablet DISSOLVE 1 TABLET BY MOUTH DAILY AS NEEDED FOR ANXIETY. (Patient taking differently: Take 0.25 mg by mouth daily as needed (for anxiety- dissolve in the mouth). ) 20 tablet 1   docusate sodium (COLACE) 100 MG capsule Take 100 mg by mouth 3 (three) times daily.      finasteride (PROSCAR) 5 MG tablet Take 5 mg by mouth daily.      hydroxychloroquine (PLAQUENIL) 200 MG tablet Take 200 mg by mouth at bedtime.      insulin aspart protamine- aspart (NOVOLOG MIX 70/30) (70-30) 100 UNIT/ML injection Inject 12 units into skin in the morning  and 8 units evening (Patient taking differently: Inject 8-12 Units into the skin See admin instructions. Inject 12 units into the skin in the morning and 8 units in the evening) 10 mL 5   methadone (DOLOPHINE) 10 MG tablet Take 10 mg by mouth every 6 (six)  hours.      methadone (DOLOPHINE) 5 MG tablet Take 5 mg by mouth 3 (three) times daily as needed (for breakthrough pain).      nitroGLYCERIN (NITROSTAT) 0.4 MG SL tablet Place 1 tablet (0.4 mg total) under the tongue every 5 (five) minutes x 3 doses as needed for chest pain. 25 tablet 3   polyethylene glycol powder (MIRALAX) powder Take 17 g by mouth 2 (two) times daily. (Patient taking differently: Take 17 g by mouth at bedtime. ) 255 g    Polyvinyl Alcohol-Povidone (REFRESH OP) Place 1 drop into both eyes 3 (three) times daily as needed (for dry eyes).      predniSONE (DELTASONE) 5 MG tablet TAKE 1 TABLET BY MOUTH ONCE A DAY WITH BREAKFAST. (Patient taking differently: Take 5 mg by mouth daily with breakfast. ) 30 tablet 5   pregabalin (LYRICA) 25 MG capsule TAKE ONE CAPSULE BY MOUTH AT BEDTIME. (Patient taking differently: Take 25 mg by mouth at bedtime. ) 30 capsule 5   senna (SENOKOT) 8.6 MG TABS tablet Take 1 tablet by mouth 2 (two) times daily.     tamsulosin (FLOMAX) 0.4 MG CAPS capsule Take 1 capsule (0.4 mg total) by mouth at bedtime. 30 capsule 5   torsemide (DEMADEX) 10 MG tablet TAKE 2 TABLETS BY MOUTH EVERY OTHER DAY. (Patient taking differently: Take 20 mg by mouth every other day. ) 30 tablet 5   DULoxetine (CYMBALTA) 30 MG capsule Take 30 mg by mouth at bedtime.      No facility-administered medications prior to visit.     Review of Systems  Constitutional: Negative.   HENT: Positive for congestion.   Respiratory: Positive for cough and sputum production.   Cardiovascular: Negative for chest pain and leg swelling.  Gastrointestinal: Negative.   Genitourinary: Negative.   Musculoskeletal: Positive for joint pain and myalgias.  Skin: Negative.   Neurological: Negative.      Objective:   Vitals:   10/31/19 1426  BP: 110/62  Pulse: 83  Temp: (!) 97.3 F (36.3 C)  TempSrc: Oral  SpO2: 98%   98% on   RA BMI Readings from Last 3 Encounters:  10/27/19 24.84  kg/m  09/25/19 24.91 kg/m  09/15/19 24.91 kg/m   Wt Readings from Last 3 Encounters:  10/27/19 163 lb 6.4 oz (74.1 kg)  09/15/19 163 lb 12.8 oz (74.3 kg)  09/10/19 175 lb (79.4 kg)    Physical Exam Vitals signs reviewed.  Constitutional:      Appearance: Normal appearance. He is not ill-appearing.  HENT:     Head: Normocephalic and atraumatic.     Nose:     Comments: Deferred due to masking requirement.    Mouth/Throat:     Comments: Deferred due to masking requirement. Eyes:     General: No scleral icterus. Neck:     Musculoskeletal: Neck supple.  Cardiovascular:     Rate and Rhythm: Normal rate and regular rhythm.     Heart sounds: No murmur.  Pulmonary:     Comments: Breathing comfortably on room air. Reduced right basilar breath sounds. No rhonchi or wheezing. Abdominal:     General: There is no distension.     Palpations: Abdomen is soft.     Tenderness: There is no abdominal tenderness.  Musculoskeletal:        General: No swelling.     Comments: Ulnar deviation bilaterally, fixed MCP contractures. Amputated LLE.   Lymphadenopathy:     Cervical: No cervical adenopathy.  Skin:    General: Skin is warm and dry.     Findings: No rash.  Neurological:     Mental Status: He is alert.     Comments: On motorized wheelchair, globally weak, but moving all extremities spontaneously  Psychiatric:        Mood and Affect: Mood normal.        Behavior: Behavior normal.      CBC    Component Value Date/Time   WBC 6.6 10/27/2019 1845   RBC 3.02 (L) 10/27/2019 1845   HGB 9.2 (L) 10/27/2019 1845   HGB 8.7 (L) 09/10/2019 1218   HCT 28.8 (L) 10/27/2019 1845   HCT 25.8 (L) 09/10/2019 1218   PLT 133 (L) 10/27/2019 1845   PLT 158 09/10/2019 1218   MCV 95.4 10/27/2019 1845   MCV 91 09/10/2019 1218   MCH 30.5 10/27/2019 1845   MCHC 31.9 10/27/2019 1845   RDW 13.8 10/27/2019 1845   RDW 13.2 09/10/2019 1218   LYMPHSABS 2.1 09/14/2019 0524   MONOABS 0.6 09/14/2019  0524   EOSABS 0.5 09/14/2019 0524   BASOSABS 0.1 09/14/2019 0524    CHEMISTRY Recent Labs  Lab 10/27/19 1845 10/28/19 0455  NA 135 139  K 4.2 4.5  CL 96* 102  CO2 31 31  GLUCOSE 394* 156*  BUN 21 21  CREATININE 1.39* 1.21  CALCIUM 8.6* 8.3*  MG 2.3  --    Estimated Creatinine Clearance: 55 mL/min (by C-G formula based on SCr of 1.21 mg/dL).  ABG 09/03/2019:  7.36/ 52/ 196/ 29, 100% saturation   Micro: 10/27/2019 Covid negative 10/08/2019 respiratory culture-normal flora 10/08/2019-Candida tropicalis 10/08/2019 AFB pending, smear negative  Chest Imaging- films reviewed: CXR 08/31/2019, 2 view- low lung volumes.  Right lower lobe opacity with silhouetting of the entire hemidiaphragm.  No obvious pleural effusion.  AICD in place.  Calcified vasculature.  Right lower lobe opacity has been present since July 2020; new since January 2020.  CT chest 10/16/2016- no obvious  mediastinal or hilar lymphadenopathy.  Patulous esophagus.  Significant vascular calcifications.  Subpleural pulmonary nodules.  Subpleural scarring in the posterior lateral right lung.  Scarring in bilateral lower lobes with pleural tags.  Consolidation in a bronchovascular pattern in the right lower lobe with air bronchograms.  Elevated right hemidiaphragm.  CT chest 10/10/2019-vairway thickening in upper lobes, prominent bronchiectasis in bilateral lower lobes associated with right lower lobe opacity. Scattered peripheral right upper lobe opacities, patulous esophagus.  No significant mediastinal adenopathy.  Large PA and LA.  Pulmonary Functions Testing Results: No flowsheet data found.    Echocardiogram 07/02/2019: LVEF 60 to 97% with diastolic dysfunction.  Normal left atrium.  Normal RV and RA.  Severe aortic stenosis, mild MR, mild TR. AVA 0.92 cm  Heart Catheterization 08/29/2019:  Fick Cardiac Output 6.47 L/min  Fick Cardiac Output Index 3.43 (L/min)/BSA  Aortic Mean Gradient 30.95 mmHg  Aortic Peak  Gradient 38 mmHg  Aortic Valve Area 1.14  Aortic Value Area Index 0.6 cm2/BSA  RA A Wave 3 mmHg  RA V Wave 2 mmHg  RA Mean 0 mmHg  RV Systolic Pressure 20 mmHg  RV Diastolic Pressure -2 mmHg  RV EDP 1 mmHg  PA Systolic Pressure 23 mmHg  PA Diastolic Pressure 3 mmHg  PA Mean 11 mmHg  PW A Wave 3 mmHg  PW V Wave 6 mmHg  PW Mean 2 mmHg  AO Systolic Pressure 026 mmHg  AO Diastolic Pressure 51 mmHg  AO Mean 73 mmHg  LV Systolic Pressure 378 mmHg  LV Diastolic Pressure 6 mmHg  LV EDP 9 mmHg  AOp Systolic Pressure 588 mmHg  AOp Diastolic Pressure 46 mmHg  AOp Mean Pressure 73 mmHg  LVp Systolic Pressure 502 mmHg  LVp Diastolic Pressure 8 mmHg  LVp EDP Pressure 16 mmHg  QP/QS 1  TPVR Index 3.21 HRUI  TSVR Index 21.27 HRUI  TPVR/TSVR Ratio 0.15   Heart catheterization 09/03/2019   Severe 4-vessel native CAD with 100% flush CTO of the LAD, RI and RCA with 60% stenosis of ostial OM1 and ostial PDA (fill via SVG-PDA)  Widely patent LIMA-LAD, SVG-RI with moderate ostial and proximal disease in SVG-RPDA (retrograde flow from RPDA 2 RPL system somewhat jeopardized by ostial 75% PDA lesion-not PCI target)  Echocardiographically Severe Aortic Stenosis -> with direct measurements of P-P pressure 38 million mercury and mean 31 mmHg.  Normal CARDIAC OUTPUT & CARDIAC INDEX  Relatively normal RIGHT HEART CATH pressures    Assessment & Plan:     ICD-10-CM   1. Bronchiectasis without complication (HCC)  D74.1 voriconazole (VFEND) 200 MG tablet    Ambulatory referral to Infectious Disease    IgG 1, 2, 3, and 4    IgG, IgA, IgM    IgE    sodium chloride HYPERTONIC 3 % nebulizer solution    Ambulatory Referral for DME    Respiratory Therapy Supplies (FLUTTER) DEVI    IgE    IgG, IgA, IgM    IgG 1, 2, 3, and 4  2. Candida tropicalis infection  B37.9 voriconazole (VFEND) 200 MG tablet    Ambulatory referral to Infectious Disease    sodium chloride HYPERTONIC 3 % nebulizer solution     Ambulatory Referral for DME    Respiratory Therapy Supplies (FLUTTER) DEVI  3. Rheumatoid arthritis, involving unspecified site, unspecified whether rheumatoid factor present ([place])  M06.9      Bilateral lower lobe bronchiectasis, most prominent in the right lower lobe associated with peripheral opacification.  Likely this is a sequela of previous aspirations and/or infections.  Bronchiectasis is responsible for his chronic productive cough.  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  I am concerned with his chronic immunosuppression combined with structural lung disease that he is at higher risk for this becoming pathogenic organism. -Referral to ID.  Start voriconazole 200 mg twice daily.  I would like for infectious disease to weigh in on decision to treat and the duration of treatment. [email or web address] -Start airway clearance therapy regimen-start nebulized hypertonic saline twice daily followed by flutter valve x10.  He has been instructed that his chronic cough and sputum production will persist, but is best managed by regular airway clearance therapy to reduce stagnant mucus to prevent progression. -Checking immunoglobulin levels today  RA on chronic immunosuppression-currently on low-dose prednisone and his maintenance therapy at this point is mostly pain control. -Has never had films to evaluate atlantoaxial instability  -Up-to-date on seasonal flu and pneumonia vaccines -Check immunoglobulin levels  Severe AS-chronic hoarseness, wonder if this is due to Ortner's -Continue work-up for TAVR per cardiology   RTC 6 weeks.     Current Outpatient Medications:    amitriptyline (ELAVIL) 50 MG tablet, TAKE 1 AND 1/2 TABLETS BY MOUTH AT BEDTIME. (Patient taking differently: Take 75 mg by mouth at bedtime. ), Disp: 45 tablet, Rfl: 5   aspirin EC 81 MG tablet, Take 81 mg by mouth daily., Disp: , Rfl:    atorvastatin (LIPITOR) 80 MG  tablet, TAKE ONE TABLET BY MOUTH ONCE DAILY. (Patient taking differently: Take 80 mg by mouth daily. ), Disp: 30 tablet, Rfl: 5   blood glucose meter kit and supplies KIT, Dispense based on patient and insurance preference. Use up to 3 times daily as directed. DX:E11.9, Disp: 1 each, Rfl: 0   Cholecalciferol (VITAMIN D3) 25 MCG (1000 UT) CHEW, Chew 1,000 Units by mouth daily. , Disp: , Rfl:    clonazePAM (KLONOPIN) 0.25 MG disintegrating tablet, DISSOLVE 1 TABLET BY MOUTH DAILY AS NEEDED FOR ANXIETY. (Patient taking differently: Take 0.25 mg by mouth daily as needed (for anxiety- dissolve in the mouth). ), Disp: 20 tablet, Rfl: 1   docusate sodium (COLACE) 100 MG capsule, Take 100 mg by mouth 3 (three) times daily. , Disp: , Rfl:    finasteride (PROSCAR) 5 MG tablet, Take 5 mg by mouth daily. , Disp: , Rfl:    hydroxychloroquine (PLAQUENIL) 200 MG tablet, Take 200 mg by mouth at bedtime. , Disp: , Rfl:    insulin aspart protamine- aspart (NOVOLOG MIX 70/30) (70-30) 100 UNIT/ML injection, Inject 12 units into skin in the morning  and 8 units evening (Patient taking differently: Inject 8-12 Units into the skin See admin instructions. Inject 12 units into the skin in the morning and 8 units in the evening), Disp: 10 mL, Rfl: 5   methadone (DOLOPHINE) 10 MG tablet, Take 10 mg by mouth every 6 (six) hours. , Disp: , Rfl:    methadone (DOLOPHINE) 5 MG tablet, Take 5 mg by mouth 3 (three) times daily as needed (for breakthrough pain). , Disp: , Rfl:    nitroGLYCERIN (NITROSTAT) 0.4 MG SL tablet, Place 1 tablet (0.4 mg total) under the tongue every 5 (five) minutes x 3 doses as needed for chest pain., Disp: 25 tablet, Rfl: 3   polyethylene glycol powder (MIRALAX) powder, Take 17 g by mouth 2 (two) times daily. (Patient taking differently: Take 17 g by mouth at bedtime. ), Disp: 255 g, Rfl:  Polyvinyl Alcohol-Povidone (REFRESH OP), Place 1 drop into both eyes 3 (three) times daily as needed (for dry  eyes). , Disp: , Rfl:    predniSONE (DELTASONE) 5 MG tablet, TAKE 1 TABLET BY MOUTH ONCE A DAY WITH BREAKFAST. (Patient taking differently: Take 5 mg by mouth daily with breakfast. ), Disp: 30 tablet, Rfl: 5   pregabalin (LYRICA) 25 MG capsule, TAKE ONE CAPSULE BY MOUTH AT BEDTIME. (Patient taking differently: Take 25 mg by mouth at bedtime. ), Disp: 30 capsule, Rfl: 5   senna (SENOKOT) 8.6 MG TABS tablet, Take 1 tablet by mouth 2 (two) times daily., Disp: , Rfl:    tamsulosin (FLOMAX) 0.4 MG CAPS capsule, Take 1 capsule (0.4 mg total) by mouth at bedtime., Disp: 30 capsule, Rfl: 5   torsemide (DEMADEX) 10 MG tablet, TAKE 2 TABLETS BY MOUTH EVERY OTHER DAY. (Patient taking differently: Take 20 mg by mouth every other day. ), Disp: 30 tablet, Rfl: 5   DULoxetine (CYMBALTA) 30 MG capsule, Take 30 mg by mouth at bedtime. , Disp: , Rfl:    Respiratory Therapy Supplies (FLUTTER) DEVI, 1 each by Does not apply route 2 (two) times daily., Disp: 1 each, Rfl: 0   sodium chloride HYPERTONIC 3 % nebulizer solution, Take by nebulization 2 (two) times daily., Disp: 750 mL, Rfl: 12   voriconazole (VFEND) 200 MG tablet, Take 1 tablet (200 mg total) by mouth 2 (two) times daily., Disp: 60 tablet, Rfl: 1   Julian Hy, DO [facility] 10/31/2019 3:16 PM

## 2019-10-31 ENCOUNTER — Other Ambulatory Visit: Payer: Self-pay

## 2019-10-31 ENCOUNTER — Ambulatory Visit: Payer: Medicare HMO | Admitting: Critical Care Medicine

## 2019-10-31 ENCOUNTER — Encounter: Payer: Self-pay | Admitting: Critical Care Medicine

## 2019-10-31 VITALS — BP 110/62 | HR 83 | Temp 97.3°F

## 2019-10-31 DIAGNOSIS — B379 Candidiasis, unspecified: Secondary | ICD-10-CM | POA: Diagnosis not present

## 2019-10-31 DIAGNOSIS — M069 Rheumatoid arthritis, unspecified: Secondary | ICD-10-CM | POA: Diagnosis not present

## 2019-10-31 DIAGNOSIS — J479 Bronchiectasis, uncomplicated: Secondary | ICD-10-CM

## 2019-10-31 MED ORDER — SODIUM CHLORIDE 3 % IN NEBU: INHALATION_SOLUTION | Freq: Two times a day (BID) | RESPIRATORY_TRACT | 12 refills | Status: AC

## 2019-10-31 MED ORDER — VORICONAZOLE 200 MG PO TABS: 200.0000 mg | ORAL_TABLET | Freq: Two times a day (BID) | ORAL | 1 refills | Status: AC

## 2019-10-31 MED ORDER — FLUTTER DEVI: 1.0000 | Freq: Two times a day (BID) | 0 refills | Status: AC

## 2019-10-31 NOTE — Patient Instructions (Addendum)
Thank you for visiting Dr. Carlis Abbott at [facility]. We recommend the following: Orders Placed This Encounter  Procedures  . IgG 1, 2, 3, and 4  . IgG, IgA, IgM  . IgE  . Ambulatory referral to Infectious Disease  . Ambulatory Referral for DME   Orders Placed This Encounter  Procedures  . IgG 1, 2, 3, and 4    Standing Status:   Future    Standing Expiration Date:   10/30/2020  . IgG, IgA, IgM    Standing Status:   Future    Standing Expiration Date:   10/30/2020  . IgE    Standing Status:   Future    Standing Expiration Date:   10/30/2020  . Ambulatory referral to Infectious Disease    Referral Priority:   Urgent    Referral Type:   Consultation    Referral Reason:   Specialty Services Required    Requested Specialty:   Infectious Diseases    Number of Visits Requested:   1  . Ambulatory Referral for DME    Referral Priority:   Routine    Referral Type:   Durable Medical Equipment Purchase    Number of Visits Requested:   1    Meds ordered this encounter  Medications  . voriconazole (VFEND) 200 MG tablet    Sig: Take 1 tablet (200 mg total) by mouth 2 (two) times daily.    Dispense:  60 tablet    Refill:  1  . sodium chloride HYPERTONIC 3 % nebulizer solution    Sig: Take by nebulization 2 (two) times daily.    Dispense:  750 mL    Refill:  12  . Respiratory Therapy Supplies (FLUTTER) DEVI    Sig: 1 each by Does not apply route 2 (two) times daily.    Dispense:  1 each    Refill:  0    Return in about 6 weeks (around 12/12/2019).   Hypertonic saline from [facility] The respiratory home health company will contact you for your nebulizer.  Use your nebulizer two times daily, followed by your flutter valve.    Please do your part to reduce the spread of COVID-19.

## 2019-11-01 LAB — IGG, IGA, IGM
IgG (Immunoglobin G), Serum: 1336 mg/dL (ref 600–1540)
IgM, Serum: 57 mg/dL (ref 50–300)
Immunoglobulin A: 330 mg/dL — ABNORMAL HIGH (ref 70–320)

## 2019-11-01 LAB — IGE: IgE (Immunoglobulin E), Serum: 1569 kU/L — ABNORMAL HIGH (ref <=114)

## 2019-11-02 LAB — IGG 1, 2, 3, AND 4
IgG (Immunoglobin G), Serum: 1310 mg/dL (ref 603–1613)
IgG, Subclass 1: 563 mg/dL (ref 248–810)
IgG, Subclass 2: 384 mg/dL (ref 130–555)
IgG, Subclass 3: 59 mg/dL (ref 15–102)
IgG, Subclass 4: 98 mg/dL — ABNORMAL HIGH (ref 2–96)

## 2019-11-04 ENCOUNTER — Other Ambulatory Visit: Payer: Self-pay

## 2019-11-04 ENCOUNTER — Institutional Professional Consult (permissible substitution): Payer: Medicare HMO | Admitting: Thoracic Surgery (Cardiothoracic Vascular Surgery)

## 2019-11-04 ENCOUNTER — Encounter: Payer: Self-pay | Admitting: Thoracic Surgery (Cardiothoracic Vascular Surgery)

## 2019-11-04 VITALS — BP 137/71 | HR 85 | Temp 97.5°F | Resp 16 | Ht 69.5 in | Wt 160.0 lb

## 2019-11-04 DIAGNOSIS — I359 Nonrheumatic aortic valve disorder, unspecified: Secondary | ICD-10-CM

## 2019-11-04 DIAGNOSIS — I5032 Chronic diastolic (congestive) heart failure: Secondary | ICD-10-CM

## 2019-11-04 DIAGNOSIS — Z950 Presence of cardiac pacemaker: Secondary | ICD-10-CM | POA: Diagnosis not present

## 2019-11-04 DIAGNOSIS — I350 Nonrheumatic aortic (valve) stenosis: Secondary | ICD-10-CM | POA: Diagnosis not present

## 2019-11-04 DIAGNOSIS — Z951 Presence of aortocoronary bypass graft: Secondary | ICD-10-CM | POA: Diagnosis not present

## 2019-11-04 DIAGNOSIS — R260 Ataxic gait: Secondary | ICD-10-CM | POA: Diagnosis not present

## 2019-11-04 DIAGNOSIS — J479 Bronchiectasis, uncomplicated: Secondary | ICD-10-CM | POA: Diagnosis not present

## 2019-11-04 DIAGNOSIS — L03116 Cellulitis of left lower limb: Secondary | ICD-10-CM | POA: Diagnosis not present

## 2019-11-04 DIAGNOSIS — M069 Rheumatoid arthritis, unspecified: Secondary | ICD-10-CM | POA: Diagnosis not present

## 2019-11-04 DIAGNOSIS — I5043 Acute on chronic combined systolic (congestive) and diastolic (congestive) heart failure: Secondary | ICD-10-CM | POA: Diagnosis not present

## 2019-11-04 NOTE — Patient Instructions (Signed)
Continue all previous medications without any changes at this time  

## 2019-11-04 NOTE — Addendum Note (Signed)
Addended by: Eileen Stanford on: 11/04/2019 03:24 PM   Modules accepted: Orders

## 2019-11-04 NOTE — Progress Notes (Signed)
HE[facility] AND [place] SURGERY CONSULTATION REPORT  Primary Cardiologist is Carlyle Dolly, MD PCP is Wolfgang Phoenix [name], MD  Chief Complaint  Patient presents with   Aortic Stenosis    TAVR EVAL .Marland Kitchenreview all required studies/procedures..CAROTIS U/S 10/28/19 per notes, PT eval not preformed    HPI:  Patient is 68 year old male with complex past medical history including coronary artery disease status post coronary artery bypass grafting in 2016, chronic combined systolic and diastolic congestive heart failure, aortic stenosis, chronic kidney disease, complete heart block status post permanent pacemaker implantation, type 2 diabetes mellitus with multiple complications including diabetic neuropathy, peripheral arterial disease status post left below-knee amputation, hypertension, severe rheumatoid arthritis on long-term immunosuppression, Candida tropicalis infection, and bronchiectasis who has been referred for surgical consultation to discuss treatment options for management of severe symptomatic aortic stenosis.  Patient has longstanding history of rheumatoid arthritis for which she has been chronically immunosuppressed.  He has been disabled since 2001 when he initially presented with severe sepsis.  He developed coronary artery disease and underwent coronary artery bypass grafting x3 at [place] in 2016.  In 2017 he developed severe sepsis related to arterial insufficiency involving his left lower extremity complicated by severe infection which ultimately required left below-knee amputation.  His hospitalization at that time was complicated by acute renal failure requiring hemodialysis.  He was on dialysis for 8 months but kidney function eventually recovered.    Patient was admitted to [facility] in [place] in early July 2020 with altered mental status, hypotension, and bradycardia felt to  be consistent with cardiogenic shock.  Echocardiogram performed at that time was reported to demonstrate normal left ventricular systolic function with severe aortic stenosis, although peak velocity across aortic valve was reported only 2.6 m/s corresponding to mean transvalvular gradient estimated 16.8 mmHg.  DVI was reported 0.29 and aortic valve area calculated 0.79 cm.  Cardiac enzymes were low and flat and felt not to be consistent with acute coronary syndrome.  BNP level was elevated at the time of admission consistent with acute on chronic diastolic congestive heart failure.  The patient was referred to the multidisciplinary heart valve clinic and evaluated by Dr. Angelena Form as an outpatient on August 29, 2019.  At the time of his evaluation he was noted to complain of chest pain and he was directly admitted to the hospital where he was found to have symptomatic bradycardia and complete heart block.  He underwent permanent pacemaker placement on August 30, 2019.  Diagnostic cardiac catheterization was performed September 03, 2019 and revealed severe native coronary artery disease but continued patency of left internal mammary artery to the left anterior descending coronary artery, saphenous vein graft to the ramus intermediate branch, and saphenous vein graft to the posterior descending coronary artery.  Peak to peak and mean transvalvular gradients across the aortic valve were measured 38 and 31 mmHg, respectively.  Right heart pressures were normal.  The patient developed acute kidney injury with history of stage III chronic kidney disease during his admission, but creatinine returned towards baseline prior to discharge.  The patient was discharged from the hospital and referred to the pulmonary medicine clinic for evaluation of chronic cough.  The patient was readmitted to the hospital 1 week later with acute renal failure felt likely to be related to contrast-induced nephropathy.  Renal function again  recovered and returned towards baseline.  The patient was evaluated as an outpatient  by Dr. Carlis Abbott in the pulmonary medicine clinic and diagnosed with severe bilateral lower lobe bronchiectasis, right greater than left.  The patient was recently seen in follow-up by Dr. 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  He has subsequently been referred to the infectious disease clinic.  CT angiography was performed to evaluate the feasibility of transcatheter aortic valve replacement and cardiothoracic surgical consultation was requested.  Patient is married and lives with his wife in [place].  He has been disabled since 2001 for multiple chronic medical problems.  He has severely restricted mobility although he can ambulate short distances using a walker and a prosthesis for his left lower extremity.  For the most part he is wheelchair-bound and he is very sedentary.  He has been on long-term immunosuppressive therapy for severe rheumatoid arthritis.  He has a chronic cough that is productive and has been going on for several years.  He does not have hemoptysis and he denies recent fevers or chills.  He describes symptoms of exertional shortness of breath, although he states that his breathing is really not been problematic since his hospitalization in September when he underwent permanent pacemaker placement.  He does have some lower extremity edema.  He does not have chest pain or chest tightness either with activity or at rest.    Past Medical History:  Diagnosis Date   Aortic stenosis    ARF (acute renal failure) ([place]) 09/11/2019   Chronic back pain    Chronic combined systolic (congestive) and diastolic (congestive) heart failure ([place])    a. EF 30-35% in 2016 b. at 30-35% by echo in 08/2016 c. 06/2019: echo showing EF of 60-65% but found to have severe AS.    Chronic neck pain    CKD (chronic kidney disease), stage III    Complete heart  block ([place]) 08/29/2019   a. s/p Medtronic dual chamber pacemeker implantation on 08/30/2019.   Coronary artery disease    a. s/p CABG in 06/2015 with LIMA-LAD, SVG-PDA, SVG-Intermediate   Diabetes mellitus    Diabetic neuropathy (HCC)    Hyperkalemia    Hypertension    Loculated pleural effusion 09/09/2016   Pancytopenia ([place]) 08/13/2014   Prolonged QT interval 08/14/2014   Possibly secondary to methadone and amitriptyline.   Rheumatoid arthritis(714.0)    Seizure ([place]) 08/13/2014   Pt denies    Past Surgical History:  Procedure Laterality Date   BELOW KNEE LEG AMPUTATION Left 06/216   left leg   CARDIAC SURGERY     FRACTURE SURGERY     MUSCLE BIOPSY  06/2016   PACEMAKER IMPLANT N/A 08/30/2019   Procedure: PACEMAKER IMPLANT;  Surgeon: Evans Lance, MD;  Location: [facility];  Service: Cardiovascular;  Laterality: N/A;   right foot surgery-Toe amputations     RIGHT/LEFT HEART CATH AND CORONARY/GRAFT ANGIOGRAPHY N/A 09/03/2019   Procedure: RIGHT/LEFT HEART CATH AND CORONARY/GRAFT ANGIOGRAPHY;  Surgeon: Leonie Man, MD;  Location: [facility];  Service: Cardiovascular;  Laterality: N/A;   TEMPORARY PACEMAKER N/A 08/29/2019   Procedure: TEMPORARY PACEMAKER;  Surgeon: Sherren Mocha, MD;  Location: [facility];  Service: Cardiovascular;  Laterality: N/A;   TOTAL HIP ARTHROPLASTY Left     Family History  Problem Relation Age of Onset   Diabetes Father    Alzheimer's disease Father    Heart attack Brother        multiple brothers with MIs   COPD Brother  Hypertension Mother    Stroke Mother    Head & neck cancer Nephew     Social History   Socioeconomic History   Marital status: Married    Spouse name: Not on file   Number of children: 2   Years of education: Not on file   Highest education level: Not on file  Occupational History   Occupation: Retired Programme researcher, broadcasting/film/video strain: Not on  file   Food insecurity    Worry: Not on file    Inability: Not on Lexicographer needs    Medical: Not on file    Non-medical: Not on file  Tobacco Use   Smoking status: Never Smoker   Smokeless tobacco: Never Used  Substance and Sexual Activity   Alcohol use: No   Drug use: No   Sexual activity: Yes  Lifestyle   Physical activity    Days per week: Not on file    Minutes per session: Not on file   Stress: Not on file  Relationships   Social connections    Talks on phone: Not on file    Gets together: Not on file    Attends religious service: Not on file    Active member of club or organization: Not on file    Attends meetings of clubs or organizations: Not on file    Relationship status: Not on file   Intimate partner violence    Fear of current or ex partner: Not on file    Emotionally abused: Not on file    Physically abused: Not on file    Forced sexual activity: Not on file  Other Topics Concern   Not on file  Social History Narrative   Not on file    Current Outpatient Medications  Medication Sig Dispense Refill   amitriptyline (ELAVIL) 50 MG tablet TAKE 1 AND 1/2 TABLETS BY MOUTH AT BEDTIME. (Patient taking differently: Take 75 mg by mouth at bedtime. ) 45 tablet 5   aspirin EC 81 MG tablet Take 81 mg by mouth daily.     atorvastatin (LIPITOR) 80 MG tablet TAKE ONE TABLET BY MOUTH ONCE DAILY. (Patient taking differently: Take 80 mg by mouth daily. ) 30 tablet 5   blood glucose meter kit and supplies KIT Dispense based on patient and insurance preference. Use up to 3 times daily as directed. DX:E11.9 1 each 0   Cholecalciferol (VITAMIN D3) 25 MCG (1000 UT) CHEW Chew 1,000 Units by mouth daily.      clonazePAM (KLONOPIN) 0.25 MG disintegrating tablet DISSOLVE 1 TABLET BY MOUTH DAILY AS NEEDED FOR ANXIETY. (Patient taking differently: Take 0.25 mg by mouth daily as needed (for anxiety- dissolve in the mouth). ) 20 tablet 1   docusate sodium  (COLACE) 100 MG capsule Take 100 mg by mouth 3 (three) times daily.      DULoxetine (CYMBALTA) 30 MG capsule Take 30 mg by mouth at bedtime.      finasteride (PROSCAR) 5 MG tablet Take 5 mg by mouth daily.      hydroxychloroquine (PLAQUENIL) 200 MG tablet Take 200 mg by mouth at bedtime.      insulin aspart protamine- aspart (NOVOLOG MIX 70/30) (70-30) 100 UNIT/ML injection Inject 12 units into skin in the morning  and 8 units evening (Patient taking differently: Inject 8-12 Units into the skin See admin instructions. Inject 12 units into the skin in the morning and 8 units in the evening) 10 mL  5   methadone (DOLOPHINE) 10 MG tablet Take 10 mg by mouth every 6 (six) hours.      nitroGLYCERIN (NITROSTAT) 0.4 MG SL tablet Place 1 tablet (0.4 mg total) under the tongue every 5 (five) minutes x 3 doses as needed for chest pain. 25 tablet 3   polyethylene glycol powder (MIRALAX) powder Take 17 g by mouth 2 (two) times daily. (Patient taking differently: Take 17 g by mouth at bedtime. ) 255 g    methadone (DOLOPHINE) 5 MG tablet Take 5 mg by mouth 3 (three) times daily as needed (for breakthrough pain).      Polyvinyl Alcohol-Povidone (REFRESH OP) Place 1 drop into both eyes 3 (three) times daily as needed (for dry eyes).      predniSONE (DELTASONE) 5 MG tablet TAKE 1 TABLET BY MOUTH ONCE A DAY WITH BREAKFAST. (Patient taking differently: Take 5 mg by mouth daily with breakfast. ) 30 tablet 5   pregabalin (LYRICA) 25 MG capsule TAKE ONE CAPSULE BY MOUTH AT BEDTIME. (Patient taking differently: Take 25 mg by mouth at bedtime. ) 30 capsule 5   Respiratory Therapy Supplies (FLUTTER) DEVI 1 each by Does not apply route 2 (two) times daily. 1 each 0   senna (SENOKOT) 8.6 MG TABS tablet Take 1 tablet by mouth 2 (two) times daily.     sodium chloride HYPERTONIC 3 % nebulizer solution Take by nebulization 2 (two) times daily. 750 mL 12   tamsulosin (FLOMAX) 0.4 MG CAPS capsule Take 1 capsule (0.4 mg  total) by mouth at bedtime. 30 capsule 5   torsemide (DEMADEX) 10 MG tablet TAKE 2 TABLETS BY MOUTH EVERY OTHER DAY. (Patient taking differently: Take 20 mg by mouth every other day. ) 30 tablet 5   voriconazole (VFEND) 200 MG tablet Take 1 tablet (200 mg total) by mouth 2 (two) times daily. 60 tablet 1   No current facility-administered medications for this visit.     Allergies  Allergen Reactions   Sulfa Antibiotics Rash      Review of Systems:   General:  normal appetite, decreased energy, no weight gain, no weight loss, no fever  Cardiac:  no chest pain with exertion, no chest pain at rest, +SOB with exertion, no resting SOB, no PND, no orthopnea, no palpitations, no arrhythmia, no atrial fibrillation, + LE edema, + dizzy spells, + syncope prior to pacemaker implantation  Respiratory:  + shortness of breath, no home oxygen, + productive cough, + dry cough, + bronchitis, no wheezing, no hemoptysis, no asthma, no pain with inspiration or cough, no sleep apnea, no CPAP at night  GI:   no difficulty swallowing, no reflux, no frequent heartburn, no hiatal hernia, no abdominal pain, no constipation, no diarrhea, no hematochezia, no hematemesis, no melena  GU:   no dysuria,  no frequency, no urinary tract infection, no hematuria, no enlarged prostate, no kidney stones, + kidney disease  Vascular:  no pain suggestive of claudication, no pain in feet, no leg cramps, no varicose veins, no DVT, no non-healing foot ulcer  Neuro:   no stroke, no TIA's, no seizures, no headaches, no temporary blindness one eye,  no slurred speech, no peripheral neuropathy, + chronic pain, + instability of gait, + memory/cognitive dysfunction  Musculoskeletal: no arthritis, no joint swelling, no myalgias, + difficulty walking, very limited mobility   Skin:   no rash, no itching, + skin infections, no pressure sores or ulcerations  Psych:   + anxiety, + depression, + nervousness,  NO unusual recent stress  Eyes:   +  blurry vision, + floaters, no recent vision changes, + wears glasses or contacts  ENT:   no hearing loss, no loose or painful teeth, edentulous with full set dentures  Hematologic:  no easy bruising, no abnormal bleeding, no clotting disorder, no frequent epistaxis  Endocrine:  + diabetes, does check CBG's at home           Physical Exam:   BP 137/71 (BP Location: Right Arm, Patient Position: Sitting, Cuff Size: Normal)    Pulse 85    Temp (!) 97.5 F (36.4 C)    Resp 16    Ht 5' 9.5" (1.765 m)    Wt 160 lb (72.6 kg)    SpO2 96%    BMI 23.29 kg/m   General:  chronically ill-appearing in NAD  HEENT:  Unremarkable   Neck:   no JVD, no bruits, no adenopathy   Chest:   clear to auscultation, symmetrical breath sounds, no wheezes, no rhonchi   CV:   RRR, grade II/VI systolic murmur heard best at LLSB,  no diastolic murmur  Abdomen:  soft, non-tender, no masses   Extremities:  warm, s/p left BKA, R LE adequately - perfused, pulses not palpable, no LE edema  Rectal/GU  Deferred  Neuro:   Grossly non-focal and symmetrical throughout  Skin:   Clean and dry, no rashes, no breakdown   Diagnostic Tests:    ECHOCARDIOGRAM REPORT       Patient Name:   [name] Date of Exam: 07/02/2019 Medical Rec #:  [id number]    Height:       71.0 in Accession #:    [id number]   Weight:       171.1 lb Date of Birth:  1951/05/05    BSA:          1.97 m Patient Age:    88 years     BP:           152/73 mmHg Patient Gender: M            HR:           72 bpm. Exam Location:  Inpatient    Procedure: 2D Echo and Intracardiac Opacification Agent  Indications:    Murmur 785.2/R01.11   History:        Patient has prior history of Echocardiogram examinations, most                 recent 09/03/2016. CHF CAD LBBB Aortic Valve Disease Risk Factors:                 Diabetes and Hypertension. CKD.   Sonographer:    Clayton Lefort RDCS (AE) Referring Phys: [id number] [facility]    Sonographer Comments:  Technically difficult study due to poor echo windows, suboptimal apical window and no subcostal window. IMPRESSIONS    1. The left ventricle has normal systolic function with an ejection fraction of 60-65%. The cavity size was normal. Left ventricular diastolic Doppler parameters are consistent with pseudonormalization.  2. The right ventricle has normal systolic function. The cavity was normal. There is no increase in right ventricular wall thickness.  3. The mitral valve is abnormal. Mild thickening of the mitral valve leaflet. There is moderate mitral annular calcification present.  4. The aortic valve is abnormal. Severely thickening of the aortic valve. Severe calcifcation of the aortic valve. Aortic valve regurgitation is mild to moderate by color flow Doppler.  Severe stenosis of the aortic valve.  5. The aortic root and ascending aorta are normal in size and structure.  6. The interatrial septum was not assessed.  FINDINGS  Left Ventricle: The left ventricle has normal systolic function, with an ejection fraction of 60-65%. The cavity size was normal. There is no increase in left ventricular wall thickness. Left ventricular diastolic Doppler parameters are consistent with  pseudonormalization. Definity contrast agent was given IV to delineate the left ventricular endocardial borders.  Right Ventricle: The right ventricle has normal systolic function. The cavity was normal. There is no increase in right ventricular wall thickness.  Left Atrium: Left atrial size was normal in size.  Right Atrium: Right atrial size was normal in size. Right atrial pressure is estimated at 10 mmHg.  Interatrial Septum: The interatrial septum was not assessed.  Pericardium: There is no evidence of pericardial effusion.  Mitral Valve: The mitral valve is abnormal. Mild thickening of the mitral valve leaflet. There is moderate mitral annular calcification present. Mitral valve regurgitation is mild  by color flow Doppler.  Tricuspid Valve: The tricuspid valve is normal in structure. Tricuspid valve regurgitation is mild by color flow Doppler.  Aortic Valve: The aortic valve is abnormal Severely thickening of the aortic valve. Severe calcifcation of the aortic valve. Aortic valve regurgitation is mild to moderate by color flow Doppler. There is Severe stenosis of the aortic valve, with a  calculated valve area of 0.92 cm.  Pulmonic Valve: The pulmonic valve was grossly normal. Pulmonic valve regurgitation is not visualized by color flow Doppler.  Aorta: The aortic root and ascending aorta are normal in size and structure.    +--------------+--------++  LEFT VENTRICLE            +----------------+---------++ +--------------+--------++  Diastology                    PLAX 2D                   +----------------+---------++ +--------------+--------++  LV e' lateral:   9.03 cm/s    LVIDd:         4.70 cm    +----------------+---------++ +--------------+--------++  LV E/e' lateral: 9.6          LVIDs:         3.20 cm    +----------------+---------++ +--------------+--------++  LV e' medial:    5.55 cm/s    LV PW:         1.10 cm    +----------------+---------++ +--------------+--------++  LV E/e' medial:  15.7         LV IVS:        1.10 cm    +----------------+---------++ +--------------+--------++  LVOT diam:     2.00 cm    +--------------+--------++  LV SV:         61 ml      +--------------+--------++  LV SV Index:   31.08      +--------------+--------++  LVOT Area:     3.14 cm   +--------------+--------++                            +--------------+--------++  +---------------+-------++-----------++  LEFT ATRIUM              Index         +---------------+-------++-----------++  LA diam:        3.10 cm  1.57 cm/m    +---------------+-------++-----------++  LA Vol (A2C):  64.8 ml  32.84 ml/m   +---------------+-------++-----------++  LA Vol (A4C):   55.3  ml  28.03 ml/m   +---------------+-------++-----------++  LA Biplane Vol: 60.6 ml  30.71 ml/m   +---------------+-------++-----------++ +------------+---------++-----------++  RIGHT ATRIUM            Index         +------------+---------++-----------++  RA Area:     10.80 cm                +------------+---------++-----------++  RA Volume:   21.40 ml   10.85 ml/m   +------------+---------++-----------++  +------------------+------------++  AORTIC VALVE                      +------------------+------------++  AV Area (Vmax):    0.79 cm       +------------------+------------++  AV Area (Vmean):   0.83 cm       +------------------+------------++  AV Area (VTI):     0.92 cm       +------------------+------------++  AV Vmax:           262.00 cm/s    +------------------+------------++  AV Vmean:          179.400 cm/s   +------------------+------------++  AV VTI:            0.630 m        +------------------+------------++  AV Peak Grad:      27.5 mmHg      +------------------+------------++  AV Mean Grad:      16.8 mmHg      +------------------+------------++  LVOT Vmax:         65.87 cm/s     +------------------+------------++  LVOT Vmean:        47.467 cm/s    +------------------+------------++  LVOT VTI:          0.184 m        +------------------+------------++  LVOT/AV VTI ratio: 0.29           +------------------+------------++   +-------------+-------++  AORTA                   +-------------+-------++  Ao Root diam: 3.00 cm   +-------------+-------++  +--------------+----------++  +---------------+-----------++  MITRAL VALVE                  TRICUSPID VALVE               +--------------+----------++  +---------------+-----------++  MV Area (PHT): 3.85 cm       TR Peak grad:   10.2 mmHg     +--------------+----------++  +---------------+-----------++  MV Peak grad:  3.7 mmHg       TR Vmax:        170.00 cm/s   +--------------+----------++   +---------------+-----------++  MV Mean grad:  2.0 mmHg     +--------------+----------++  +--------------+-------+  MV Vmax:       0.97 m/s       SHUNTS                  +--------------+----------++  +--------------+-------+  MV Vmean:      60.0 cm/s      Systemic VTI:  0.18 m   +--------------+----------++  +--------------+-------+  MV VTI:        0.33 m         Systemic Diam: 2.00 cm  +--------------+----------++  +--------------+-------+  MV PHT:        57.13 msec   +--------------+----------++  MV Decel Time: 197 msec     +--------------+----------++ +--------------+-----------++  MV E velocity: 87.00 cm/s    +--------------+-----------++  MV A velocity: 100.00 cm/s   +--------------+-----------++  MV E/A ratio:  0.87          +--------------+-----------++    Mertie Moores MD Electronically signed by Mertie Moores MD Signature Date/Time: 07/02/2019/4:17:24 PM      RIGHT/LEFT HEART CATH AND CORONARY/GRAFT ANGIOGRAPHY  Conclusion    Ost RCA to Dist RCA lesion is 100% stenosed.  Dist LM to Mid LAD lesion is 100% stenosed.  Ramus lesion is 100% stenosed.  1st Mrg lesion is 65% stenosed.  GRAFTS  LIMA-LAD graft was visualized by angiography and is normal in caliber. The graft exhibits no disease.  SVG-RI graft was visualized by angiography and is normal in caliber. The graft exhibits no disease.  SVG-rPDA graft was visualized by angiography and is large. Origin lesion is 50% stenosed.  RPDA lesion is 75% stenosed.  ---------------------------------------  Hemodynamic findings consistent with aortic valve stenosis. There is severe aortic valve stenosis by echo, but by cath moderate to severe  LV end diastolic pressure is normal.  The left ventricular systolic function is normal with normal cardiac output index.   SUMMARY  Severe 4-vessel native CAD with 100% flush CTO of the LAD, RI and RCA with 60% stenosis of ostial OM1 and ostial PDA (fill via  SVG-PDA)  Widely patent LIMA-LAD, SVG-RI with moderate ostial and proximal disease in SVG-RPDA (retrograde flow from RPDA 2 RPL system somewhat jeopardized by ostial 75% PDA lesion-not PCI target)  Echocardiographically Severe Aortic Stenosis -> with direct measurements of P-P pressure 38 million mercury and mean 31 mmHg.  Normal CARDIAC OUTPUT & CARDIAC INDEX  Relatively normal RIGHT HEART CATH pressures   RECOMMENDATIONS  Patient will return to his nursing unit for ongoing care after sheath removal in PACU holding area  Proceed with structural heart disease team evaluation for TAVR  Continue risk factor modification    Glenetta Hew, MD    Recommendations  Antiplatelet/Anticoag Recommend Aspirin 56m daily for moderate CAD.  Indications  Coronary artery disease involving native coronary artery of native heart with unstable angina pectoris (HCC) [I25.110 (ICD-10-CM)]  Severe aortic stenosis [I35.0 (ICD-10-CM)]  Pacemaker [Z95.0 (ICD-10-CM)]  Procedural Details  Technical Details PCP: LMikey Kirschner MD Primary Cardiologist: BCarlyle Dolly MD  Referring Cardiologist: Dr. MAngelena Form 68y.o. male with h/o CAD s/p CABG, PAD s/p BKA, RA, HTN, HLD, severe aortic stenosis and complete heart block who was admitted last week with chest pain and complete heart block. He is now s/p pacemaker.  He is now referred for right and of heart catheterization to exclude ischemic coronary etiology for chest pain and for pre-TAVR evaluation of aortic stenosis.  Time Out: Verified patient identification, verified procedure, site/side was marked, verified correct patient position, special equipment/implants available, medications/allergies/relevent history reviewed, required imaging and test results available. Performed.   Access:  --I attempted the use of ultrasound, however imaging was very poor quality.  Able to see that the femoral artery but not able to track the needle to the  vessel.  Therefore fluoroscopic guidance with manual palpation of pulse used. *Right Common Femoral Artery: 5 Fr Sheath - fluoroscopically guided modified Seldinger Technique  *Right Common Femoral Vein: 7 Fr Sheath - Seldinger Technique..  Right Heart Catheterization: Unable to advance the 7 French Swan-Ganz catheter into the pulmonary artery, therefore a 5 FP[place]ipurpose catheter was used with the Swan wire to successfully advance the wire into the pulmonary artery and exchanged for  the 7 Fr Gordy Councilman catheter that was then advanced under fluoroscopy with balloon inflated to the PCWP-PA for hemodynamic measurement.  RA and RV pressures have been measured in the initial attempt. * Simultaneous FA & PA blood gases checked for SaO2% to calculate FICK CO/CI  * Catheter removed completely out of the body with balloon deflated.  Left Heart Catheterization: 5 Fr Catheters advanced or exchanged over a J-wire under direct fluoroscopic guidance into the ascending aorta; JL4 catheter advanced first.  * LV Hemodynamics (LV Gram): AR 2 catheter * Left Coronary Artery Cineangiography: JL4 catheter  * Right Coronary Artery: JR4 catheter, flush occluded * SVG-RPDA & SVG-RICineangiography: AR 2 catheter  * LIMA-LAD Cineangiography: JR4 catheter redirected into Left Subclavian Artery & exchanged over long-exchange wire for IMA catheter.  Upon completion of Angiogaphy, the catheter was removed completely out of the body over a wire, without complication.  Femoral Sheath(s) removed in the PACU holding area with manual pressure for hemostasis.   MEDICATIONS * SQ Lidocaine 90m * Radial Cocktail: 3 mg Verapmil in 10 mL NS * Isovue Contrast: 70 mL  Fluoro time: 21.9 minutes. Dose Area Product: 22.7 Gycm2. Cumulative Air Kerma: 385.3 mGy Estimated blood loss <50 mL.   During this procedure medications were administered to achieve and maintain moderate conscious sedation while the patient's heart rate, blood  pressure, and oxygen saturation were continuously monitored and I was present face-to-face 100% of this time.  Medications (Filter: Administrations occurring from 09/03/19 0823 to 09/03/19 1017) (important)  Continuous medications are totaled by the amount administered until 09/03/19 1017.  Medication Rate/Dose/Volume Action  Date Time   lidocaine (PF) (XYLOCAINE) 1 % injection (mL) 30 mL Given 09/03/19 0852   Total dose as of 09/03/19 1017        30 mL        Heparin (Porcine) in NaCl 1000-0.9 UT/500ML-% SOLN (mL) 500 mL Given 09/03/19 0851   Total dose as of 09/03/19 1017 500 mL Given 0851   1,000 mL        fentaNYL (SUBLIMAZE) injection (mcg) 25 mcg Given 09/03/19 0853   Total dose as of 09/03/19 1017        25 mcg        midazolam (VERSED) injection (mg) 1 mg Given 09/03/19 0854   Total dose as of 09/03/19 1017        1 mg        iohexol (OMNIPAQUE) 350 MG/ML injection (mL) 70 mL Given 09/03/19 1014   Total dose as of 09/03/19 1017        70 mL        aspirin EC tablet 81 mg (mg) *0 mg Duplicate 063/01/6011093  Total dose as of 09/03/19 1017        0 mg        *Administration not included in total        Chlorhexidine Gluconate Cloth 2 % PADS 6 each (each) *6 each Not Given 09/03/19 1000   Dosing weight:  81.6 kg        Total dose as of 09/03/19 1017        0 each        *Administration not included in total        docusate sodium (COLACE) capsule 100 mg (mg) *0 mg Duplicate 023/55/7312202  Total dose as of 09/03/19 1017        0 mg        *  Administration not included in total        DULoxetine (CYMBALTA) DR capsule 30 mg (mg) *0 mg Duplicate 83/38/25 0539   Total dose as of 09/03/19 1017        0 mg        *Administration not included in total        finasteride (PROSCAR) tablet 5 mg (mg) *0 mg Duplicate 76/73/41 9379   Dosing weight:  81.6 kg        Total dose as of 09/03/19 1017        0 mg        *Administration not included in total        hydroxychloroquine  (PLAQUENIL) tablet 200 mg (mg) *0 mg Duplicate 02/40/97 3532   Dosing weight:  81.6 kg        Total dose as of 09/03/19 1017        0 mg        *Administration not included in total        polyethylene glycol (MIRALAX / GLYCOLAX) packet 17 g (g) 17 g Given 09/03/19 1000   Dosing weight:  81.6 kg        Total dose as of 09/03/19 1017        17 g        sodium chloride flush (NS) 0.9 % injection 3 mL (mL) *0 mL Duplicate 99/24/26 8341   Dosing weight:  81.6 kg        Total dose as of 09/03/19 1017        0 mL        *Administration not included in total        torsemide (DEMADEX) tablet 20 mg (mg) *0 mg Duplicate 96/22/29 7989   Total dose as of 09/03/19 1017        0 mg        *Administration not included in total        Sedation Time  Sedation Time Physician-1: 1 hour 14 minutes 7 seconds  Contrast  Medication Name Total Dose  iohexol (OMNIPAQUE) 350 MG/ML injection 70 mL    Radiation/Fluoro  Fluoro time: 21.9 (min) DAP: 22.7 (Gycm2) Cumulative Air Kerma: 211.9 (mGy)  Complications  Complications documented before study signed (09/03/2019 41:74 AM)   No complications were associated with this study.  Documented by Leonie Man, MD - 09/03/2019 10:30 AM    Coronary Findings  Diagnostic Dominance: Right Left Main  Dist LM to Mid LAD lesion 100% stenosed  Dist LM to Mid LAD lesion is 100% stenosed. The lesion is chronically occluded. The lesion is severely calcified.  Left Anterior Descending  Second Diagonal Branch  Vessel is small in size.  Third Diagonal Branch  Vessel is small in size.  Ramus Intermedius  Vessel is large.  Ramus lesion 100% stenosed  Ramus lesion is 100% stenosed. The lesion is chronically occluded. The lesion is severely calcified.  Left Circumflex  First Obtuse Marginal Branch  1st Mrg lesion 65% stenosed  1st Mrg lesion is 65% stenosed.  Left Posterior Atrioventricular Artery  Vessel is moderate in size.  Right Coronary Artery  Ost RCA  to Dist RCA lesion 100% stenosed  Ost RCA to Dist RCA lesion is 100% stenosed. The lesion is chronically occluded. Ostial The lesion is calcified.  Right Ventricular Branch  Vessel is small in size.  Right Posterior Descending Artery  RPDA lesion 75% stenosed  RPDA lesion is 75% stenosed. The lesion  is distal to major branch and focal.  First Right Posterolateral Branch  Vessel is small in size.  Second Right Posterolateral Branch  Vessel is small in size.  LIMA LIMA Graft to Dist LAD  LIMA graft was visualized by angiography and is normal in caliber. -LAD The graft exhibits no disease.  saphenous Graft to Ramus  SVG graft was visualized by angiography and is normal in caliber. -RI The graft exhibits no disease.  saphenous Graft to RPDA  SVG graft was visualized by angiography and is large. rPDA The graft is moderately ectatic.  Origin lesion 50% stenosed  Origin lesion is 50% stenosed. The lesion is focal. kink  Intervention  No interventions have been documented. Right Heart  Right Heart Pressures Hemodynamic findings consistent with aortic valve stenosis. PA P-mean: 23/3 mmHg - 11 mmHg PCWP: 3 mmHg LV P-EDP: 156/6 mmHg - 12 mmHg AOP-MAP: 112/46 mmHg - 73 mmHg Ao sat 100%, PA sat 69%. Cardiac OUTPUT-INDEX Kathlen [place]): 6.47-3.43  Right Atrium Right atrial pressure is decreased. RAP 2 mmHg  Right Ventricle RVP-EDP: 20/1 mmHg - 1 mmHg  Wall Motion  Resting    No LV gram        Left Heart  Left Ventricle The left ventricular systolic function is normal. Normal cardiac output and index LV end diastolic pressure is normal.  Aortic Valve There is severe aortic valve stenosis. Severe aortic stenosis by echo, by cath moderate to severe The aortic valve is calcified.  Coronary Diagrams  Diagnostic Dominance: Right  Intervention  Implants   No implant documentation for this case.  Syngo Images  Show images for CARDIAC CATHETERIZATION  Images on Long Term Storage  Show  images for Kunal, Levario to Procedure Log  Procedure Log    Hemo Data   Most Recent Value  Fick Cardiac Output 6.47 L/min  Fick Cardiac Output Index 3.43 (L/min)/BSA  Aortic Mean Gradient 30.95 mmHg  Aortic Peak Gradient 38 mmHg  Aortic Valve Area 1.14  Aortic Value Area Index 0.6 cm2/BSA  RA A Wave 3 mmHg  RA V Wave 2 mmHg  RA Mean 0 mmHg  RV Systolic Pressure 20 mmHg  RV Diastolic Pressure -2 mmHg  RV EDP 1 mmHg  PA Systolic Pressure 23 mmHg  PA Diastolic Pressure 3 mmHg  PA Mean 11 mmHg  PW A Wave 3 mmHg  PW V Wave 6 mmHg  PW Mean 2 mmHg  AO Systolic Pressure 701 mmHg  AO Diastolic Pressure 51 mmHg  AO Mean 73 mmHg  LV Systolic Pressure 779 mmHg  LV Diastolic Pressure 6 mmHg  LV EDP 9 mmHg  AOp Systolic Pressure 390 mmHg  AOp Diastolic Pressure 46 mmHg  AOp Mean Pressure 73 mmHg  LVp Systolic Pressure 300 mmHg  LVp Diastolic Pressure 8 mmHg  LVp EDP Pressure 16 mmHg  QP/QS 1  TPVR Index 3.21 HRUI  TSVR Index 21.27 HRUI  TPVR/TSVR Ratio 0.15     Cardiac TAVR CT  TECHNIQUE: The patient was scanned on a [employer]. A 120 kV retrospective scan was triggered in the descending thoracic aorta at 111 HU's. Gantry rotation speed was 250 msecs and collimation was .6 mm. No beta blockade or nitro were given. The 3D data set was reconstructed in 5% intervals of the R-R cycle. Systolic and diastolic phases were analyzed on a dedicated work station using MPR, MIP and VRT modes. The patient received 80 cc of contrast.  FINDINGS: Aortic Valve: Trileaflet aortic  valve with severely thickened and calcified leaflets and severely restricted leaflets openings. There are significant asymmetric calcifications extending into the LVOT and continuing into mitral annulus under the left coronary sinus.  Aorta: Normal size with mild to moderate atherosclerotic plaque and calcifications and no dissections.  Sinotubular Junction: 28 x 27 mm  Ascending  Thoracic Aorta: 31 x 30 mm  Aortic Arch: 25 x 25 mm  Descending Thoracic Aorta: 25 x 24 mm  Sinus of Valsalva Measurements:  Non-coronary: 31 mm  Right -coronary: 31 mm  Left -coronary: 33 mm  Coronary Artery Height above Annulus:  Left Main: 16 mm  Right Coronary: 18 mm  Virtual Basal Annulus Measurements:  Maximum/Minimum Diameter: 27.9 x 22.3 mm  Mean Diameter: 24.5 mm  Perimeter: 79.9 mm  Area: 486 mm2  Optimum Fluoroscopic Angle for Delivery:  RAO 2 CRA 2.  IMPRESSION: 1. Trileaflet aortic valve with severely thickened and calcified leaflets and severely restricted leaflets openings. There are significant asymmetric calcifications extending into the LVOT and continuing into mitral annulus under the left coronary sinus. Aortic valve calcium score is 4373 consistent with severe aortic stenosis. Annular measurements suitable for delivery of a 26 mm Edwards-SAPIEN 3 Ultra valve.  2. Sufficient coronary to annulus distance.  3. Optimum Fluoroscopic Angle for Delivery: RAO 2 CRA 2.  4. No thrombus in the left atrial appendage.   Electronically Signed   By: Ena Dawley   On: 10/29/2019 22:00    CT ANGIOGRAPHY CHEST, ABDOMEN AND PELVIS  TECHNIQUE: Multidetector CT imaging through the chest, abdomen and pelvis was performed using the standard protocol during bolus administration of intravenous contrast. Multiplanar reconstructed images and MIPs were obtained and reviewed to evaluate the vascular anatomy.  CONTRAST:  60m OMNIPAQUE IOHEXOL 350 MG/ML SOLN  COMPARISON:  10/10/2019 chest CT.  10/15/2016 CT abdomen/pelvis.  FINDINGS: CTA CHEST FINDINGS  Cardiovascular: Mild cardiomegaly. No significant pericardial effusion/thickening. Marked thickening and calcification of the aortic valve. 2 lead left subclavian pacemaker with lead tips in right atrium and right ventricle. Three-vessel coronary atherosclerosis status post  CABG. Atherosclerotic nonaneurysmal thoracic aorta. Normal caliber pulmonary arteries. No central pulmonary emboli.  Mediastinum/Nodes: No discrete thyroid nodules. Unremarkable esophagus. No pathologically enlarged axillary, mediastinal or hilar lymph nodes.  Lungs/Pleura: No pneumothorax. No left pleural effusion. Trace dependent right pleural effusion with smooth mild right pleural thickening and scattered right pleural calcification, unchanged. Low lung volumes. Stable mild elevation of the right hemidiaphragm. Thick platelike consolidation with associated volume loss in the dependent basilar right lower lobe, not appreciably changed back to 10/16/2016 chest CT, compatible with nonspecific postinfectious/postinflammatory scarring. No acute consolidative airspace disease, lung masses or significant pulmonary nodules. Mild mosaic attenuation throughout both lungs.  Musculoskeletal: No aggressive appearing focal osseous lesions. Intact sternotomy wires. Severe bilateral glenohumeral osteoarthritis. Mild thoracic spondylosis.  CTA ABDOMEN AND PELVIS FINDINGS  Hepatobiliary: Normal liver with no liver mass. Cholelithiasis. No biliary ductal dilatation.  Pancreas: Normal, with no mass or duct dilation.  Spleen: Normal size. No mass.  Adrenals/Urinary Tract: Normal adrenals. No hydronephrosis. Exophytic hypodense 1.5 cm renal cortical lesion in the interpolar left kidney (series 7/ image 118), stable in size since 10/15/2016, presumably a Bosniak category 2 renal cyst. No additional contour deforming renal masses. Bladder obscured by streak artifact from left hip hardware. No significant bladder distention. Chronic diffuse bladder wall thickening is unchanged.  Stomach/Bowel: Normal non-distended stomach. Normal caliber small bowel with no small bowel wall thickening. Normal appendix. Normal large bowel with no  diverticulosis, large bowel wall thickening  or pericolonic fat stranding.  Vascular/Lymphatic: Atherosclerotic nonaneurysmal abdominal aorta. Patent renal and splenic veins. No pathologically enlarged lymph nodes in the abdomen or pelvis.  Reproductive: Top-normal size prostate.  Other: No pneumoperitoneum, ascites or focal fluid collection. Small fat containing periumbilical hernia, unchanged.  Musculoskeletal: No aggressive appearing focal osseous lesions. Left total hip arthroplasty. Marked lumbar spondylosis, most prominent at L3-4.  VASCULAR MEASUREMENTS PERTINENT TO TAVR:  AORTA:  Minimal Aortic Diameter-13.5 x 11.9 mm  Severity of Aortic Calcification-moderate to severe  RIGHT PELVIS:  Right Common Iliac Artery -  Minimal Diameter-9.8 x 8.0 mm  Tortuosity-mild  Calcification-moderate  Right External Iliac Artery -  Minimal Diameter-7.6 x 7.1 mm  Tortuosity-mild-to-moderate  Calcification-mild  Right Common Femoral Artery -  Minimal Diameter-6.2 x 5.7 mm  Tortuosity-mild  Calcification-moderate  LEFT PELVIS:  Left Common Iliac Artery -  Minimal Diameter-9.1 x 7.3 mm  Tortuosity-mild  Calcification-moderate to severe  Left External Iliac Artery -  Minimal Diameter-7.5 x 6.6 mm  Tortuosity-mild-to-moderate  Calcification-mild  Left Common Femoral Artery -  Minimal Diameter-7.0 x 5.8 mm  Tortuosity-mild  Calcification-moderate  Review of the MIP images confirms the above findings.  IMPRESSION: 1. Vascular findings and measurements pertinent to potential TAVR procedure, as detailed. 2. Marked thickening and calcification of the aortic valve, compatible with the reported history of severe symptomatic aortic stenosis. 3. Cardiomegaly. 4. Chronic trace loculated dependent right pleural effusion with smooth right pleural thickening and platelike postinfectious/postinflammatory scarring at the dependent right lung base. 5.  Cholelithiasis. 6. Chronic diffuse bladder wall thickening, nonspecific. No hydronephrosis. 7. Aortic Atherosclerosis (ICD10-I70.0).   Electronically Signed   By: Ilona Sorrel M.D.   On: 10/28/2019 12:34    Impression:  Patient has at least moderate and possibly severe symptomatic aortic stenosis.  He was hospitalized in July with altered mental status, near syncope, and congestive heart failure and more recently underwent permanent pacemaker placement for severe symptomatic bradycardia and complete heart block in September of this year.  He lives a very sedentary lifestyle and denies much problems at present with shortness of breath, but he has described symptoms of exertional shortness of breath consistent with chronic diastolic congestive heart failure, [place] Heart Association functional class II.  I have personally reviewed the transthoracic echocardiogram performed on July 02, 2019.  There appears to be normal left ventricular systolic function.  The aortic valve is not well visualized and the overall sound transmission is relatively poor.  There is significant calcification, fibrosis, and restricted leaflet mobility involving all 3 leaflets of the valve.  Peak velocity across aortic valve was measured only 2.6 m/s corresponding to mean transvalvular gradient estimated 16.8 mmHg, but I am not confident that this echocardiogram was of satisfactory quality.  Diagnostic cardiac catheterization performed at the time of pacemaker implantation on August 29, 2019 revealed peak to peak and mean transvalvular gradients of 38 and 31 mmHg, respectively by catheterization, corresponding to aortic valve area calculated 1.14 cm.  Coronary angiography performed September 03, 2019 revealed severe native coronary artery disease but continued patency in the bypass grafts placed at the time patient underwent coronary artery bypass grafting x3 in 2016.  I suspect the patient's aortic stenosis is likely severe  and he would probably benefit from aortic valve replacement.  However, I would not consider this patient a candidate for conventional surgical aortic valve replacement with or without redo coronary artery bypass grafting under any circumstances because of his numerous comorbid  medical problems and severe physical limitations.  Cardiac-gated CTA of the heart reveals anatomical characteristics consistent with aortic stenosis suitable for treatment by transcatheter aortic valve replacement without any significant complicating features and CTA of the aorta and iliac vessels demonstrate what appears to be adequate pelvic vascular access to facilitate a transfemoral approach.  CT scan of the chest does confirm the presence of significant bronchiectasis in both lower lobes and the patient has recently been evaluated by Dr. 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  Infectious disease consultation has been requested and the patient has been started on antifungal treatment.  Whether or not this should affect timing of transcatheter aortic valve replacement remains unclear.    Plan:  The patient and his wife were counseled at length regarding treatment alternatives for management of severe symptomatic aortic stenosis. Alternative approaches such as conventional aortic valve replacement, transcatheter aortic valve replacement, and continued medical therapy without intervention were compared and contrasted at length.  The risks associated with conventional surgical aortic valve replacement were discussed in detail, as were expectations for post-operative convalescence, and why I would be reluctant to consider this patient a candidate for conventional surgery.  Issues specific to transcatheter aortic valve replacement were discussed including questions about long term valve durability, the potential for paravalvular leak,  possible increased risk of need for permanent pacemaker placement, and other technical complications related to the procedure itself.  Long-term prognosis with medical therapy was discussed. This discussion was placed in the context of the patient's own specific clinical presentation and past medical history.  All of their questions have been addressed.  The patient desires to proceed with transcatheter aortic valve replacement in the near future.  The patient is scheduled for routine follow-up appointment with Dr. [name] later this week to reassess kidney function following CT angiography.  We will obtain follow-up transthoracic echocardiogram to reassess left ventricular function and the severity of aortic stenosis.  We will discuss with Dr. Carlis Abbott and the infectious disease team whether or not the patient's bronchiectasis and possible active fungal infection should affect timing of transcatheter aortic valve replacement.  Following the decision to proceed with transcatheter aortic valve replacement, a discussion has been held regarding what types of management strategies would be attempted intraoperatively in the event of life-threatening complications, including whether or not the patient would be considered a candidate for the use of cardiopulmonary bypass and/or conversion to open sternotomy for attempted surgical intervention.  The patient specifically understands that should a potentially life-threatening complication develop we would not attempt emergency median sternotomy and/or other aggressive surgical procedures.  The patient has been advised of a variety of complications that might develop including but not limited to risks of death, stroke, paravalvular leak, aortic dissection or other major vascular complications, aortic annulus rupture, device embolization, cardiac rupture or perforation, mitral regurgitation, acute myocardial infarction, arrhythmia, heart block or bradycardia requiring permanent  pacemaker placement, congestive heart failure, respiratory failure, renal failure, pneumonia, infection, other late complications related to structural valve deterioration or migration, or other complications that might ultimately cause a temporary or permanent loss of functional independence or other long term morbidity.  The patient provides full informed consent for the procedure as described and all questions were answered.   I spent in excess of 90 minutes during the conduct of this office consultation and >50% of this time involved direct face-to-face encounter with the patient for counseling and/or  coordination of their care.    Valentina Gu. Roxy Manns, MD 11/04/2019 1:05 PM

## 2019-11-05 ENCOUNTER — Ambulatory Visit (HOSPITAL_COMMUNITY): Payer: Medicare HMO

## 2019-11-05 ENCOUNTER — Other Ambulatory Visit: Payer: Self-pay

## 2019-11-05 ENCOUNTER — Ambulatory Visit: Payer: Medicare HMO | Admitting: Cardiology

## 2019-11-05 DIAGNOSIS — J479 Bronchiectasis, uncomplicated: Secondary | ICD-10-CM | POA: Diagnosis not present

## 2019-11-05 DIAGNOSIS — I350 Nonrheumatic aortic (valve) stenosis: Secondary | ICD-10-CM

## 2019-11-05 DIAGNOSIS — I5043 Acute on chronic combined systolic (congestive) and diastolic (congestive) heart failure: Secondary | ICD-10-CM | POA: Diagnosis not present

## 2019-11-05 DIAGNOSIS — N183 Chronic kidney disease, stage 3 unspecified: Secondary | ICD-10-CM

## 2019-11-05 DIAGNOSIS — M069 Rheumatoid arthritis, unspecified: Secondary | ICD-10-CM | POA: Diagnosis not present

## 2019-11-05 DIAGNOSIS — L03116 Cellulitis of left lower limb: Secondary | ICD-10-CM | POA: Diagnosis not present

## 2019-11-05 DIAGNOSIS — R260 Ataxic gait: Secondary | ICD-10-CM | POA: Diagnosis not present

## 2019-11-05 LAB — FUNGUS CULTURE RESULT

## 2019-11-05 LAB — FUNGUS CULTURE WITH STAIN

## 2019-11-05 LAB — FUNGAL ORGANISM REFLEX

## 2019-11-06 ENCOUNTER — Telehealth: Payer: Self-pay

## 2019-11-06 NOTE — Telephone Encounter (Signed)
Will need to call the ID clinic tomorrow since it is after 5pm. [phone]. Will route to triage for follow up.

## 2019-11-06 NOTE — Telephone Encounter (Signed)
-----   Message from Julian Hy, DO sent at 11/06/2019  4:48 PM EST ----- Regarding: ID consult Is is possible for someone to try to call the ID clinic to see when he can be scheduled for his consult appointment? The cardiothoracic surgeons would like for them to see him before they schedule him for his valve replacement, and I agree. Thank you! Please let me know if there are any questions.  Arby Barrette

## 2019-11-08 ENCOUNTER — Ambulatory Visit (HOSPITAL_COMMUNITY)
Admission: RE | Admit: 2019-11-08 | Discharge: 2019-11-08 | Disposition: A | Discharge status: Home / Self Care | Payer: Medicare HMO | Source: Ambulatory Visit | Attending: Thoracic Surgery (Cardiothoracic Vascular Surgery) | Admitting: Thoracic Surgery (Cardiothoracic Vascular Surgery)

## 2019-11-08 ENCOUNTER — Other Ambulatory Visit: Payer: Self-pay

## 2019-11-08 ENCOUNTER — Telehealth: Payer: Self-pay

## 2019-11-08 DIAGNOSIS — I359 Nonrheumatic aortic valve disorder, unspecified: Secondary | ICD-10-CM | POA: Diagnosis not present

## 2019-11-08 NOTE — Telephone Encounter (Signed)
Thank you! Since his appointment is on 11/18, I am happy to schedule a peer to peer after that in case they decide to use a different medication. It is very helpful that ID was able to get him scheduled so quickly.

## 2019-11-08 NOTE — Telephone Encounter (Signed)
Prior Authorization was done on patient's medication Voriconazole and [employer] denied this request. Please advise on what to do for patient.

## 2019-11-08 NOTE — Telephone Encounter (Signed)
LMTCB on VM at ID

## 2019-11-08 NOTE — Progress Notes (Signed)
*  PRELIMINARY RESULTS* Echocardiogram 2D Echocardiogram has been performed.  [name] 11/08/2019, 4:16 PM

## 2019-11-08 NOTE — Telephone Encounter (Signed)
So I will probably need to do a peer to peer, but it would be very helpful if we could get him in to see ID ASAP because they tend to be more successful getting these meds approved compared to me.   Just FYI if they assign a time for a peer to peer, I am unavailable all day 11/16 and from 1:30-4pm on 11/17. If they schedule it for Wed or Thurs while I am in clinic, please block my schedule for it. Thanks!

## 2019-11-08 NOTE — Telephone Encounter (Signed)
Patients wife [name] is returning phone call.  [name] phone number is [phone].

## 2019-11-08 NOTE — Telephone Encounter (Signed)
Spoke with ID  Appt scheduled with Dr Prince Rome for Nov 18th at 11:15 am [address]  [place]  [place], [place] [address]  Phone: [phone]  Fax: [phone]  Spoke with the pt's spouse and notified of appt date, time and location and she verbalized understanding

## 2019-11-08 NOTE — Telephone Encounter (Signed)
Spoke with the pt's spouse  She is calling b/c pt was not able to obtain Vefend  According to other phone note dated today, PA was initiated and was denied  She is aware that we are awaiting recs from Dr Carlis Abbott on what to do and also awaiting ID to call back about referral  Still awaiting call back

## 2019-11-08 NOTE — Telephone Encounter (Signed)
I believe Magda Paganini was able to call ID and get him scheduled and have spoken with the wife and made her aware.

## 2019-11-11 ENCOUNTER — Telehealth: Payer: Self-pay | Admitting: Critical Care Medicine

## 2019-11-11 NOTE — Telephone Encounter (Signed)
Reviewed patient's chart, Levaquin has never been prescribed by our office. Left message for [name] to call back.

## 2019-11-12 ENCOUNTER — Encounter: Payer: Self-pay | Admitting: Cardiology

## 2019-11-12 ENCOUNTER — Telehealth (INDEPENDENT_AMBULATORY_CARE_PROVIDER_SITE_OTHER): Payer: Medicare HMO | Admitting: Cardiology

## 2019-11-12 VITALS — BP 127/70 | Ht 69.0 in | Wt 168.0 lb

## 2019-11-12 DIAGNOSIS — I442 Atrioventricular block, complete: Secondary | ICD-10-CM | POA: Diagnosis not present

## 2019-11-12 DIAGNOSIS — E782 Mixed hyperlipidemia: Secondary | ICD-10-CM

## 2019-11-12 DIAGNOSIS — I10 Essential (primary) hypertension: Secondary | ICD-10-CM

## 2019-11-12 DIAGNOSIS — I350 Nonrheumatic aortic (valve) stenosis: Secondary | ICD-10-CM | POA: Diagnosis not present

## 2019-11-12 DIAGNOSIS — I5022 Chronic systolic (congestive) heart failure: Secondary | ICD-10-CM | POA: Diagnosis not present

## 2019-11-12 NOTE — Progress Notes (Signed)
Virtual Visit via Telephone Note   This visit type was conducted due to national recommendations for restrictions regarding the COVID-19 Pandemic (e.g. social distancing) in an effort to limit this patient's exposure and mitigate transmission in our community.  Due to his co-morbid illnesses, this patient is at least at moderate risk for complications without adequate follow up.  This format is felt to be most appropriate for this patient at this time.  The patient did not have access to video technology/had technical difficulties with video requiring transitioning to audio format only (telephone).  All issues noted in this document were discussed and addressed.  No physical exam could be performed with this format.  Please refer to the patient's chart for his  consent to telehealth for [facility].   Date:  11/12/2019   ID:  [name], DOB 05-27-1951, MRN [id number]  Patient Location: Home Provider Location: Home  PCP:  [name], MD  Cardiologist:  Carlyle Dolly, MD  Electrophysiologist:  None   Evaluation Performed:  Follow-Up Visit  Chief Complaint:  Follow up  History of Present Illness:    [name] is a 68 y.o. male   1. Aortic stenosis - 10/2019 echo LVEF 40-45%, mild to mod MR, mild mitral stenosis, severe AS (mean grad 44, AVA VTI 0.9, dimensionless index 0.19)  - followed by the valvular heart disease clinic - some SOB/DOE, though sedentary due to prior amputation. No chest pain. No presyncope or syncope    2. CAD with prior CABG/Chronic systolic HF/ICM - prior angioplasty in 1993 and 1995.  - s/p CABG 06/2015 at [facility] with LIMA-LAD, SVG-PDA, SVG-intermediate. - LVEF prior to CABG 30-35%. After CABG LVEF improved to 45-50%.  - 08/2016 echo LVEF 30-35%, grade II diastolic dysfunction   02/3544 cath: severe 4 vessel native disease. Patent LIMA-LAD, SVG-RI, moderate prox disease of SVG-RPDA - no recent chest pain.   3. HTN - compliant with meds   4. Hyperlipidemia - compliant with statin   5. Postop afib - develoed after CABG in 06/2015 - now off meds   6. Rheumatoid arthritis - followed by pcp  7. CKD 3 - was transiently on HD, now off.   8. PAD - s/p left BKA - followed in wound clinic for chronic ulceration  9. Heart block - admission 08/2019 with intermittent heart block, had pacemaker placed.   The patient does not have symptoms concerning for COVID-19 infection (fever, chills, cough, or new shortness of breath).    Past Medical History:  Diagnosis Date  . Aortic stenosis   . ARF (acute renal failure) ([place]) 09/11/2019  . Chronic back pain   . Chronic combined systolic (congestive) and diastolic (congestive) heart failure (HCC)    a. EF 30-35% in 2016 b. at 30-35% by echo in 08/2016 c. 06/2019: echo showing EF of 60-65% but found to have severe AS.   [name] Chronic neck pain   . CKD (chronic kidney disease), stage III   . Complete heart block ([place]) 08/29/2019   a. s/p Medtronic dual chamber pacemeker implantation on 08/30/2019.  [name] Coronary artery disease    a. s/p CABG in 06/2015 with LIMA-LAD, SVG-PDA, SVG-Intermediate  . Diabetes mellitus   . Diabetic neuropathy ([place])   . Hyperkalemia   . Hypertension   . Loculated pleural effusion 09/09/2016  . Pancytopenia ([place]) 08/13/2014  . 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  . Rheumatoid arthritis(714.0)   . Seizure ([place])  08/13/2014   Pt denies   Past Surgical History:  Procedure Laterality Date  . 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 CARDIAC SURGERY    . FRACTURE SURGERY    . MUSCLE BIOPSY  06/2016  . PACEMAKER IMPLANT N/A 08/30/2019   Procedure: PACEMAKER IMPLANT;  Surgeon: Evans Lance, MD;  Location: [facility];  Service: Cardiovascular;  Laterality: N/A;  . right foot surgery-Toe amputations    . RIGHT/LEFT HEART CATH AND CORONARY/GRAFT ANGIOGRAPHY N/A 09/03/2019   Procedure:  RIGHT/LEFT HEART CATH AND CORONARY/GRAFT ANGIOGRAPHY;  Surgeon: Leonie Man, MD;  Location: [facility];  Service: Cardiovascular;  Laterality: N/A;  . TEMPORARY PACEMAKER N/A 08/29/2019   Procedure: TEMPORARY PACEMAKER;  Surgeon: Sherren Mocha, MD;  Location: [facility];  Service: Cardiovascular;  Laterality: N/A;  . TOTAL HIP ARTHROPLASTY Left      No outpatient medications have been marked as taking for the 11/12/19 encounter (Appointment) with Arnoldo Lenis, MD.     Allergies:   Sulfa antibiotics   Social History   Tobacco Use  . Smoking status: Never Smoker  . Smokeless tobacco: Never Used  Substance Use Topics  . Alcohol use: No  . Drug use: No     Family Hx: The patient's family history includes Alzheimer's disease in his father; COPD in his brother; Diabetes in his father; Head & neck cancer in his nephew; Heart attack in his brother; Hypertension in his mother; Stroke in his mother.  ROS:   Please see the history of present illness.     All other systems reviewed and are negative.   Prior CV studies:   The following studies were reviewed today:  [facility] 07/07/2015 Clinical Diagnoses and Echocardiographic Findings Technically difficult study due to chest wall/lung interference Contractile left ventricular dysfunction (severe) Likely anteroseptal and apical myocardial infarction Decreased left ventricular ejection fraction (15-17%) Diastolic left ventricular dysfunction Dilated left atrium (mild) Aortic sclerosis Dilated right ventricle Contractile right ventricular dysfunction Dilated right atrium  [facility] 07/09/2015 Cardiac cath 1. Significant 3-vessel coronary artery disease. 1. Suggest LIMA-LAD, SVG-PDA, SVG-Ramus 2. Normal LV filling pressures  [facility] 07/16/2015 Carotid Final Interpretation  Right Carotid: There is evidence in the ICA of a less than 40%  stenosis. Non-hemodynamically significant plaque  noted in the CCA. Plaque noted in the  ECA.  Left Carotid: There is evidence in the ICA of a 40-59% stenosis.  Non-hemodynamically significant plaque noted in the  CCA. Plaque noted in the ECA.  Vertebrals: Both vertebral arteries were patent with antegrade flow.  Subclavians: Right subclavian artery flow was disturbed. Normal flow  hemodynamics were seen in the left subclavian artery.    08/2016 echo Study Conclusions  - Left ventricle: The cavity size was normal. Wall thickness was normal. Systolic function was moderately to severely reduced. The estimated ejection fraction was in the range of 30% to 35%. Features are consistent with a pseudonormal left ventricular filling pattern, with concomitant abnormal relaxation and increased filling pressure (grade 2 diastolic dysfunction). Doppler parameters are consistent with high ventricular filling pressure. - Regional wall motion abnormality: Severe hypokinesis of the mid-apical anterior and mid anteroseptal myocardium. - Aortic valve: Moderately to severely calcified annulus. Mildly thickened, moderately calcified leaflets. There was mild to moderate stenosis. Peak velocity (S): 233 cm/s. Mean gradient (S): 12 mm Hg. Valve area (VTI): 1.23 cm^2. Valve area (Vmax): 1.31 cm^2. Valve area (Vmean): 1.19 cm^2. - Mitral valve: Mildly calcified annulus.  Mildly calcified leaflets . There was moderate regurgitation. - Left atrium: The atrium was mildly dilated. - Right ventricle: Systolic function was moderately reduced. - Tricuspid valve: There was mild regurgitation.   08/2019 cath  Ost RCA to Dist RCA lesion is 100% stenosed.  Dist LM to Mid LAD lesion is 100% stenosed.  Ramus lesion is 100% stenosed.  1st Mrg lesion is 65% stenosed.  GRAFTS  LIMA-LAD graft was visualized by angiography and is normal in caliber. The graft exhibits no disease.  SVG-RI graft was visualized by angiography and is normal in caliber. The graft exhibits no  disease.  SVG-rPDA graft was visualized by angiography and is large. Origin lesion is 50% stenosed.  RPDA lesion is 75% stenosed.  ---------------------------------------  Hemodynamic findings consistent with aortic valve stenosis. There is severe aortic valve stenosis by echo, but by cath moderate to severe  LV end diastolic pressure is normal.  The left ventricular systolic function is normal with normal cardiac output index.   SUMMARY  Severe 4-vessel native CAD with 100% flush CTO of the LAD, RI and RCA with 60% stenosis of ostial OM1 and ostial PDA (fill via SVG-PDA)  Widely patent LIMA-LAD, SVG-RI with moderate ostial and proximal disease in SVG-RPDA (retrograde flow from RPDA 2 RPL system somewhat jeopardized by ostial 75% PDA lesion-not PCI target)  Echocardiographically Severe Aortic Stenosis -> with direct measurements of P-P pressure 38 million mercury and mean 31 mmHg.  Normal CARDIAC OUTPUT & CARDIAC INDEX  Relatively normal RIGHT HEART CATH pressures   RECOMMENDATIONS  Patient will return to his nursing unit for ongoing care after sheath removal in PACU holding area  Proceed with structural heart disease team evaluation for TAVR  Continue risk factor modification  10/2019 echo IMPRESSIONS    1. Left ventricular ejection fraction, by visual estimation, is 40 to 45%. The left ventricle has mildly decreased function. There is mildly increased left ventricular hypertrophy.  2. Left ventricular diastolic parameters are indeterminate.  3. Global right ventricle has low normal systolic function.The right ventricular size is normal. No increase in right ventricular wall thickness.  4. Left atrial size was severely dilated.  5. Right atrial size was normal.  6. Moderate calcification of the mitral valve leaflet(s).  7. Moderate mitral annular calcification.  8. Moderate thickening of the mitral valve leaflet(s).  9. Severe aortic valve annular calcification.  10. The mitral valve is abnormal. Mild to moderate mitral valve regurgitation. Mild mitral stenosis. 11. The tricuspid valve is normal in structure. Tricuspid valve regurgitation is not demonstrated. 12. Aortic valve regurgitation is moderate. 13. The aortic valve has an indeterminant number of cusps. Aortic valve regurgitation is moderate. Severe aortic valve stenosis. 14. There is Severe calcifcation of the aortic valve. 15. There is Severely thickening of the aortic valve. 16. The pulmonic valve was not well visualized. Pulmonic valve regurgitation is not visualized. 17. The interatrial septum was not well visualized.   Labs/Other Tests and Data Reviewed:    EKG:  No ECG reviewed.  Recent Labs: 07/03/2019: ALT 327 09/10/2019: NT-Pro BNP 15,761 09/11/2019: B Natriuretic Peptide 1,766.3 10/27/2019: Hemoglobin 9.2; Magnesium 2.3; Platelets 133 10/28/2019: BUN 21; Creatinine, Ser 1.21; Potassium 4.5; Sodium 139   Recent Lipid Panel Lab Results  Component Value Date/Time   CHOL 115 08/30/2019 02:15 AM   CHOL 142 10/18/2018 03:46 PM   TRIG 43 08/30/2019 02:15 AM   HDL 48 08/30/2019 02:15 AM   HDL 68 10/18/2018 03:46 PM   CHOLHDL 2.4 08/30/2019  02:15 AM   LDLCALC 58 08/30/2019 02:15 AM   LDLCALC 59 10/18/2018 03:46 PM    Wt Readings from Last 3 Encounters:  11/04/19 160 lb (72.6 kg)  10/27/19 163 lb 6.4 oz (74.1 kg)  09/15/19 163 lb 12.8 oz (74.3 kg)     Objective:    Vital Signs:   Today's Vitals   11/12/19 1029  BP: 127/70  Weight: 168 lb (76.2 kg)  Height: 5\' 9"  (1.753 m)   Body mass index is 24.81 kg/m. NOrmal affect. Normal speech pattern and tone. COmfortable, no apparent distress. No audible signs of SOB or wheezing.   ASSESSMENT & PLAN:    1. CAD/ICM/Chronic systolic HF - no current symptoms, continue current meds  2. Aortic stenosis - severe, ongoing workup per valve clinic for likely TAVR  3. HTN - he is at goal, cotninue current meds  4.  Hyperlipidemia -continue staitn  5. Heart block - no recent symptoms, has pacemaker followed in device clinic - continue to monitor  COVID-19 Education: The signs and symptoms of COVID-19 were discussed with the patient and how to seek care for testing (follow up with PCP or arrange E-visit).  The importance of social distancing was discussed today.  Time:   Today, I have spent 22 minutes with the patient with telehealth technology discussing the above problems.     Medication Adjustments/Labs and Tests Ordered: Current medicines are reviewed at length with the patient today.  Concerns regarding medicines are outlined above.   Tests Ordered: No orders of the defined types were placed in this encounter.   Medication Changes: No orders of the defined types were placed in this encounter.   Follow Up:  In Person in 4 month(s)  Signed, Carlyle Dolly, MD  11/12/2019 8:07 AM    [place]

## 2019-11-12 NOTE — Telephone Encounter (Signed)
LMTCB x2 for pt's wife.  

## 2019-11-12 NOTE — Patient Instructions (Signed)
Your physician recommends that you schedule a follow-up appointment in: 4 MONTHS WITH DR BRANCH  Your physician recommends that you continue on your current medications as directed. Please refer to the Current Medication list given to you today.  Thank you for choosing [facility]!!    

## 2019-11-13 ENCOUNTER — Ambulatory Visit: Payer: Medicare HMO | Admitting: Infectious Diseases

## 2019-11-13 NOTE — Telephone Encounter (Signed)
Spoke with pt's wife. Advised her that I do not see where our office prescribed this medication. [name] was confused and call the wrong office. Nothing further was needed.

## 2019-11-14 DIAGNOSIS — M069 Rheumatoid arthritis, unspecified: Secondary | ICD-10-CM | POA: Diagnosis not present

## 2019-11-14 DIAGNOSIS — J479 Bronchiectasis, uncomplicated: Secondary | ICD-10-CM | POA: Diagnosis not present

## 2019-11-14 DIAGNOSIS — B379 Candidiasis, unspecified: Secondary | ICD-10-CM | POA: Diagnosis not present

## 2019-11-15 LAB — IGG 1, 2, 3, AND 4
IgG (Immunoglobin G), Serum: 1347 mg/dL (ref 603–1613)
IgG, Subclass 1: 598 mg/dL (ref 248–810)
IgG, Subclass 2: 387 mg/dL (ref 130–555)
IgG, Subclass 3: 62 mg/dL (ref 15–102)
IgG, Subclass 4: 115 mg/dL — ABNORMAL HIGH (ref 2–96)

## 2019-11-20 ENCOUNTER — Ambulatory Visit: Payer: Medicare HMO | Admitting: Internal Medicine

## 2019-11-25 ENCOUNTER — Other Ambulatory Visit: Payer: Self-pay

## 2019-11-25 ENCOUNTER — Ambulatory Visit (INDEPENDENT_AMBULATORY_CARE_PROVIDER_SITE_OTHER): Payer: Medicare HMO | Admitting: Internal Medicine

## 2019-11-25 ENCOUNTER — Encounter: Payer: Self-pay | Admitting: Internal Medicine

## 2019-11-25 DIAGNOSIS — J470 Bronchiectasis with acute lower respiratory infection: Secondary | ICD-10-CM | POA: Diagnosis not present

## 2019-11-25 DIAGNOSIS — M1991 Primary osteoarthritis, unspecified site: Secondary | ICD-10-CM

## 2019-11-25 DIAGNOSIS — R768 Other specified abnormal immunological findings in serum: Secondary | ICD-10-CM

## 2019-11-25 DIAGNOSIS — M059 Rheumatoid arthritis with rheumatoid factor, unspecified: Secondary | ICD-10-CM | POA: Diagnosis not present

## 2019-11-25 DIAGNOSIS — I739 Peripheral vascular disease, unspecified: Secondary | ICD-10-CM | POA: Diagnosis not present

## 2019-11-25 DIAGNOSIS — D696 Thrombocytopenia, unspecified: Secondary | ICD-10-CM | POA: Diagnosis not present

## 2019-11-25 DIAGNOSIS — M1990 Unspecified osteoarthritis, unspecified site: Secondary | ICD-10-CM | POA: Insufficient documentation

## 2019-11-25 DIAGNOSIS — I442 Atrioventricular block, complete: Secondary | ICD-10-CM | POA: Diagnosis not present

## 2019-11-25 DIAGNOSIS — Z89512 Acquired absence of left leg below knee: Secondary | ICD-10-CM | POA: Diagnosis not present

## 2019-11-25 DIAGNOSIS — J479 Bronchiectasis, uncomplicated: Secondary | ICD-10-CM | POA: Insufficient documentation

## 2019-11-25 NOTE — Progress Notes (Signed)
[facility]  Reason for Consult: Bronchiectasis and possible Candida tropicalis bronchitis Referring Provider: Dr. Eusebio Me  Assessment: It is difficult to know if the Candida tropicalis represents asymptomatic colonization or a chronic, smoldering pathogen.  Candida is usually not considered a significant pulmonary pathogen but he is chronically immunosuppressed with 5 mg of prednisone daily for his rheumatoid arthritis.  He also has elevated IgE levels but this normally does not make people at greater than average risk for fungal infections.  I agree with a trial of voriconazole to see if he notes significant improvement.  After asking him several times a day he said that maybe his cough was a little bit better since starting voriconazole 10 days ago.  Of course, some of his improvement may also be related to Mucinex and/or his new regimen of aerosolized saline.  I asked him to continue the voriconazole and follow-up with me in 2-1/2 weeks.   Plan: 1. Continue voriconazole 2. Follow-up here on 12/12/2019  Patient Active Problem List   Diagnosis Date Noted  . Bronchiectasis ([place]) 11/25/2019    Priority: High  . Elevated IgE level 11/25/2019    Priority: Medium  . Severe aortic stenosis by prior echocardiogram     Priority: Medium  . Current chronic use of systemic steroids 07/01/2019    Priority: Medium  . Rheumatoid arthritis ([place])     Priority: Medium  . Thrombocytopenia ([place]) 11/25/2019  . Peripheral artery disease ([place]) 11/25/2019  . DJD (degenerative joint disease) 11/25/2019  . Type 2 diabetes mellitus with peripheral vascular disease ([place]) 09/11/2019  . Chronic diastolic heart failure ([place]) 09/04/2019  . Pressure injury of skin 09/04/2019  . Pacemaker   . Heart block AV third degree ([place])   . Opioid dependence on maintenance agonist therapy, no symptoms ([place]) 07/01/2019  . Elevated troponin 07/01/2019  . LBBB (left bundle branch  block) 07/01/2019  . Loculated pleural effusion 09/09/2016  . S/P BKA (below knee amputation) unilateral, left ([place]) 08/09/2016  . Status post amputation of toe of left foot ([place]) 04/27/2016  . Systolic heart failure ([place]) 07/09/2015  . Prolonged QT interval 08/14/2014  . Gait disorder 08/14/2014  . Seizure ([place]) 08/13/2014  . Normocytic anemia 08/13/2014  . Closed dislocation of metatarsophalangeal (joint) 03/26/2013  . Insulin dependent diabetes mellitus ([place]) 12/11/2012  . Hyperlipidemia 12/11/2012  . Coronary artery disease   . Hypertension     Patient's Medications  New Prescriptions   No medications on file  Previous Medications   AMITRIPTYLINE (ELAVIL) 50 MG TABLET    TAKE 1 AND 1/2 TABLETS BY MOUTH AT BEDTIME.   ASPIRIN EC 81 MG TABLET    Take 81 mg by mouth daily.   ATORVASTATIN (LIPITOR) 80 MG TABLET    TAKE ONE TABLET BY MOUTH ONCE DAILY.   BLOOD GLUCOSE METER KIT AND SUPPLIES KIT    Dispense based on patient and insurance preference. Use up to 3 times daily as directed. DX:E11.9   CHOLECALCIFEROL (VITAMIN D3) 25 MCG (1000 UT) CHEW    Chew 1,000 Units by mouth daily.    CLONAZEPAM (KLONOPIN) 0.25 MG DISINTEGRATING TABLET    DISSOLVE 1 TABLET BY MOUTH DAILY AS NEEDED FOR ANXIETY.   DOCUSATE SODIUM (COLACE) 100 MG CAPSULE    Take 100 mg by mouth 3 (three) times daily.    DULOXETINE (CYMBALTA) 30 MG CAPSULE    Take 30 mg by mouth at bedtime.    FINASTERIDE (  PROSCAR) 5 MG TABLET    Take 5 mg by mouth daily.    HYDROXYCHLOROQUINE (PLAQUENIL) 200 MG TABLET    Take 200 mg by mouth at bedtime.    INSULIN ASPART PROTAMINE- ASPART (NOVOLOG MIX 70/30) (70-30) 100 UNIT/ML INJECTION    Inject 12 units into skin in the morning  and 8 units evening   METHADONE (DOLOPHINE) 10 MG TABLET    Take 10 mg by mouth every 6 (six) hours.    METHADONE (DOLOPHINE) 5 MG TABLET    Take 5 mg by mouth 3 (three) times daily as needed (for breakthrough pain).    NITROGLYCERIN (NITROSTAT) 0.4 MG SL TABLET     Place 1 tablet (0.4 mg total) under the tongue every 5 (five) minutes x 3 doses as needed for chest pain.   POLYETHYLENE GLYCOL POWDER (MIRALAX) POWDER    Take 17 g by mouth 2 (two) times daily.   POLYVINYL ALCOHOL-POVIDONE (REFRESH OP)    Place 1 drop into both eyes 3 (three) times daily as needed (for dry eyes).    PREDNISONE (DELTASONE) 5 MG TABLET    TAKE 1 TABLET BY MOUTH ONCE A DAY WITH BREAKFAST.   PREGABALIN (LYRICA) 25 MG CAPSULE    TAKE ONE CAPSULE BY MOUTH AT BEDTIME.   RESPIRATORY THERAPY SUPPLIES (FLUTTER) DEVI    1 each by Does not apply route 2 (two) times daily.   SENNA (SENOKOT) 8.6 MG TABS TABLET    Take 1 tablet by mouth 2 (two) times daily.   SODIUM CHLORIDE HYPERTONIC 3 % NEBULIZER SOLUTION    Take by nebulization 2 (two) times daily.   TAMSULOSIN (FLOMAX) 0.4 MG CAPS CAPSULE    Take 1 capsule (0.4 mg total) by mouth at bedtime.   TORSEMIDE (DEMADEX) 10 MG TABLET    TAKE 2 TABLETS BY MOUTH EVERY OTHER DAY.   VORICONAZOLE (VFEND) 200 MG TABLET    Take 1 tablet (200 mg total) by mouth 2 (two) times daily.  Modified Medications   No medications on file  Discontinued Medications   No medications on file    HPI: [name] is a 68 y.o. male with multiple chronic medical problems who developed a chronic, productive cough several years ago.  He states that the cough came on gradually.  He has never smoked cigarettes but tells me that his father was a heavy smoker and he worked in [employer] for many years with many fellow employees smoking around him.  He has had pneumonia on several occasions.  His cough is productive of very thick green sputum.  Sometimes the sputum will gag him at night and make it difficult to breathe.  His cough has been largely unchanged over the past few years.  He has not had any fever, chills or sweats.  He was hospitalized in early September of this year with bradycardia and heart failure.  He was found to have complete AV block and a permanent  pacemaker was placed.  He was readmitted later that month with acute on chronic kidney failure.  He was treated for a Pseudomonas UTI with 5 days of oral levofloxacin.  He does not recall having any dysuria or fever.  During his hospitalizations he was found to have severe aortic valve stenosis and he has been evaluated for possible valve replacement.  Because of his chronic cough he was referred to Dr. Noemi Chapel.  Sputum stain and cultures were obtained on 10/08/2019.  The only thing isolated was Candida tropicalis.  He underwent a chest CT scan which showed bronchiectasis in the lower lobes, right greater than left.  Dr. Carlis Abbott prescribed voriconazole for possible symptomatic infection.  He started on voriconazole 10 days ago along with Mucinex and a new regimen of aerosolized saline.  He states that his shortness of breath may be a little bit worse recently but his cough may be slightly better.   Dr. Carlis Abbott also ordered immunoglobulin levels.  His IgE levels were quite elevated.  He tells me that he is not bothered by seasonal allergies.  He has not had any documented eosinophilia.  His IgA level was only slightly elevated.  Total IgG and IgM levels were normal.  His IgG subclass 4 levels were slightly elevated.  He has a history of coronary artery disease with coronary artery bypass grafting in 2016.  He also has peripheral artery disease and underwent left BKA several years ago.  He also has diabetes and severe rheumatoid arthritis.  He is fairly sedentary and gets around in a motorized wheelchair.  Review of Systems: Review of Systems  Constitutional: Negative for chills, diaphoresis, fever and weight loss.  HENT: Negative for congestion and sore throat.   Respiratory: Positive for cough, sputum production and shortness of breath. Negative for hemoptysis and wheezing.   Cardiovascular: Negative for chest pain.  Gastrointestinal: Negative for abdominal pain, diarrhea, nausea and vomiting.   Genitourinary: Negative for dysuria.  Musculoskeletal: Positive for joint pain.  Skin: Negative for rash.      Past Medical History:  Diagnosis Date  . Aortic stenosis   . ARF (acute renal failure) ([place]) 09/11/2019  . Chronic back pain   . Chronic combined systolic (congestive) and diastolic (congestive) heart failure ([facility])    a. EF 30-35% in 2016 b. at 30-35% by echo in 08/2016 c. 06/2019: echo showing EF of 60-65% but found to have severe AS.   [name] Chronic neck pain   . CKD (chronic kidney disease), stage III   . Complete heart block ([place]) 08/29/2019   a. s/p [employer] dual chamber pacemeker implantation on 08/30/2019.  [name] Coronary artery disease    a. s/p CABG in 06/2015 with LIMA-LAD, SVG-PDA, SVG-Intermediate  . Diabetes mellitus   . Diabetic neuropathy ([place])   . Hyperkalemia   . Hypertension   . Loculated pleural effusion 09/09/2016  . Pancytopenia ([place]) 08/13/2014  . 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  . Rheumatoid arthritis(714.0)   . Seizure ([place]) 08/13/2014   Pt denies    Social History   Tobacco Use  . Smoking status: Never Smoker  . Smokeless tobacco: Never Used  Substance Use Topics  . Alcohol use: No  . Drug use: No    Family History  Problem Relation Age of Onset  . Diabetes Father   . Alzheimer's disease Father   . Heart attack Brother        multiple brothers with MIs  . COPD Brother   . Hypertension Mother   . Stroke Mother   . Head & neck cancer Nephew    Allergies  Allergen Reactions  . Sulfa Antibiotics Rash    OBJECTIVE: Vitals:   11/25/19 1414  BP: 113/71  Pulse: 96  Temp: 98 F (36.7 C)  Weight: 163 lb (73.9 kg)  Height: '5\' 8"'  (1.727 m)   Body mass index is 24.78 kg/m.   Physical Exam Constitutional:      Comments: He is very pleasant and in no distress.  He is seated in his wheelchair.  He is accompanied by his wife.  Cardiovascular:     Rate and Rhythm: Normal rate and  regular rhythm.     Heart sounds: Murmur present.     Comments: He has a 3/6 holosystolic murmur. Pulmonary:     Effort: Pulmonary effort is normal.     Breath sounds: Rales present. No wheezing.     Comments: He has diffuse, bilateral crackles. Musculoskeletal:     Comments: He has severe deforming changes in chronic rheumatoid arthritis in his hands.  He has had prior left BKA.  Psychiatric:        Mood and Affect: Mood normal.     Microbiology: No results found for this or any previous visit (from the past 240 hour(s)).  Michel Bickers, MD [facility] [phone] pager   [phone] cell 11/25/2019, 3:03 PM

## 2019-11-25 NOTE — Assessment & Plan Note (Signed)
It is difficult to know if the Candida tropicalis represents asymptomatic colonization or a chronic, smoldering pathogen.  Candida is usually not considered a significant pulmonary pathogen but he is chronically immunosuppressed with 5 mg of prednisone daily for his rheumatoid arthritis.  He also has elevated IgE levels but this normally does not make people at greater than average risk for fungal infections.  I agree with a trial of voriconazole to see if he notes significant improvement.  After asking him several times a day he said that maybe his cough was a little bit better since starting voriconazole 10 days ago.  Of course, some of his improvement may also be related to Mucinex and/or his new regimen of aerosolized saline.  I asked him to continue the voriconazole and follow-up with me in 2-1/2 weeks.

## 2019-12-02 ENCOUNTER — Encounter: Payer: Medicare HMO | Admitting: Internal Medicine

## 2019-12-04 DIAGNOSIS — R260 Ataxic gait: Secondary | ICD-10-CM | POA: Diagnosis not present

## 2019-12-04 DIAGNOSIS — M069 Rheumatoid arthritis, unspecified: Secondary | ICD-10-CM | POA: Diagnosis not present

## 2019-12-04 DIAGNOSIS — J479 Bronchiectasis, uncomplicated: Secondary | ICD-10-CM | POA: Diagnosis not present

## 2019-12-04 DIAGNOSIS — L03116 Cellulitis of left lower limb: Secondary | ICD-10-CM | POA: Diagnosis not present

## 2019-12-04 DIAGNOSIS — I5043 Acute on chronic combined systolic (congestive) and diastolic (congestive) heart failure: Secondary | ICD-10-CM | POA: Diagnosis not present

## 2019-12-05 ENCOUNTER — Emergency Department (HOSPITAL_COMMUNITY): Payer: Medicare HMO

## 2019-12-05 ENCOUNTER — Encounter: Payer: Self-pay | Admitting: Critical Care Medicine

## 2019-12-05 ENCOUNTER — Encounter (HOSPITAL_COMMUNITY): Payer: Self-pay

## 2019-12-05 ENCOUNTER — Ambulatory Visit (INDEPENDENT_AMBULATORY_CARE_PROVIDER_SITE_OTHER): Payer: Medicare HMO | Admitting: Critical Care Medicine

## 2019-12-05 ENCOUNTER — Other Ambulatory Visit: Payer: Self-pay

## 2019-12-05 ENCOUNTER — Inpatient Hospital Stay (HOSPITAL_COMMUNITY)
Admission: EM | Admit: 2019-12-05 | Discharge: 2019-12-27 | DRG: 871 | Disposition: E | Payer: Medicare HMO | Attending: Critical Care Medicine | Admitting: Critical Care Medicine

## 2019-12-05 DIAGNOSIS — E1142 Type 2 diabetes mellitus with diabetic polyneuropathy: Secondary | ICD-10-CM | POA: Diagnosis present

## 2019-12-05 DIAGNOSIS — I130 Hypertensive heart and chronic kidney disease with heart failure and stage 1 through stage 4 chronic kidney disease, or unspecified chronic kidney disease: Secondary | ICD-10-CM | POA: Diagnosis present

## 2019-12-05 DIAGNOSIS — A4102 Sepsis due to Methicillin resistant Staphylococcus aureus: Principal | ICD-10-CM | POA: Diagnosis present

## 2019-12-05 DIAGNOSIS — D849 Immunodeficiency, unspecified: Secondary | ICD-10-CM | POA: Diagnosis present

## 2019-12-05 DIAGNOSIS — E1165 Type 2 diabetes mellitus with hyperglycemia: Secondary | ICD-10-CM | POA: Diagnosis not present

## 2019-12-05 DIAGNOSIS — N179 Acute kidney failure, unspecified: Secondary | ICD-10-CM | POA: Diagnosis present

## 2019-12-05 DIAGNOSIS — J189 Pneumonia, unspecified organism: Secondary | ICD-10-CM

## 2019-12-05 DIAGNOSIS — R570 Cardiogenic shock: Secondary | ICD-10-CM | POA: Diagnosis not present

## 2019-12-05 DIAGNOSIS — D689 Coagulation defect, unspecified: Secondary | ICD-10-CM | POA: Diagnosis present

## 2019-12-05 DIAGNOSIS — E43 Unspecified severe protein-calorie malnutrition: Secondary | ICD-10-CM | POA: Diagnosis present

## 2019-12-05 DIAGNOSIS — Z9289 Personal history of other medical treatment: Secondary | ICD-10-CM

## 2019-12-05 DIAGNOSIS — F411 Generalized anxiety disorder: Secondary | ICD-10-CM | POA: Diagnosis present

## 2019-12-05 DIAGNOSIS — R7401 Elevation of levels of liver transaminase levels: Secondary | ICD-10-CM | POA: Diagnosis not present

## 2019-12-05 DIAGNOSIS — I5032 Chronic diastolic (congestive) heart failure: Secondary | ICD-10-CM | POA: Diagnosis present

## 2019-12-05 DIAGNOSIS — Z833 Family history of diabetes mellitus: Secondary | ICD-10-CM

## 2019-12-05 DIAGNOSIS — Z7982 Long term (current) use of aspirin: Secondary | ICD-10-CM

## 2019-12-05 DIAGNOSIS — N184 Chronic kidney disease, stage 4 (severe): Secondary | ICD-10-CM | POA: Diagnosis present

## 2019-12-05 DIAGNOSIS — I255 Ischemic cardiomyopathy: Secondary | ICD-10-CM | POA: Diagnosis present

## 2019-12-05 DIAGNOSIS — E876 Hypokalemia: Secondary | ICD-10-CM | POA: Diagnosis not present

## 2019-12-05 DIAGNOSIS — J1289 Other viral pneumonia: Secondary | ICD-10-CM | POA: Diagnosis not present

## 2019-12-05 DIAGNOSIS — Z89512 Acquired absence of left leg below knee: Secondary | ICD-10-CM | POA: Diagnosis not present

## 2019-12-05 DIAGNOSIS — J151 Pneumonia due to Pseudomonas: Secondary | ICD-10-CM | POA: Diagnosis present

## 2019-12-05 DIAGNOSIS — N170 Acute kidney failure with tubular necrosis: Secondary | ICD-10-CM | POA: Diagnosis present

## 2019-12-05 DIAGNOSIS — Z96642 Presence of left artificial hip joint: Secondary | ICD-10-CM | POA: Diagnosis present

## 2019-12-05 DIAGNOSIS — R188 Other ascites: Secondary | ICD-10-CM | POA: Diagnosis present

## 2019-12-05 DIAGNOSIS — B379 Candidiasis, unspecified: Secondary | ICD-10-CM | POA: Diagnosis not present

## 2019-12-05 DIAGNOSIS — Z9911 Dependence on respirator [ventilator] status: Secondary | ICD-10-CM | POA: Diagnosis not present

## 2019-12-05 DIAGNOSIS — R579 Shock, unspecified: Secondary | ICD-10-CM

## 2019-12-05 DIAGNOSIS — D631 Anemia in chronic kidney disease: Secondary | ICD-10-CM | POA: Diagnosis present

## 2019-12-05 DIAGNOSIS — I700 Atherosclerosis of aorta: Secondary | ICD-10-CM | POA: Diagnosis present

## 2019-12-05 DIAGNOSIS — L89152 Pressure ulcer of sacral region, stage 2: Secondary | ICD-10-CM | POA: Diagnosis present

## 2019-12-05 DIAGNOSIS — Z452 Encounter for adjustment and management of vascular access device: Secondary | ICD-10-CM

## 2019-12-05 DIAGNOSIS — Z794 Long term (current) use of insulin: Secondary | ICD-10-CM

## 2019-12-05 DIAGNOSIS — R0602 Shortness of breath: Secondary | ICD-10-CM | POA: Diagnosis not present

## 2019-12-05 DIAGNOSIS — Z950 Presence of cardiac pacemaker: Secondary | ICD-10-CM

## 2019-12-05 DIAGNOSIS — R4182 Altered mental status, unspecified: Secondary | ICD-10-CM

## 2019-12-05 DIAGNOSIS — R627 Adult failure to thrive: Secondary | ICD-10-CM | POA: Diagnosis present

## 2019-12-05 DIAGNOSIS — I708 Atherosclerosis of other arteries: Secondary | ICD-10-CM | POA: Diagnosis present

## 2019-12-05 DIAGNOSIS — R1310 Dysphagia, unspecified: Secondary | ICD-10-CM | POA: Diagnosis present

## 2019-12-05 DIAGNOSIS — I469 Cardiac arrest, cause unspecified: Secondary | ICD-10-CM | POA: Diagnosis not present

## 2019-12-05 DIAGNOSIS — E1151 Type 2 diabetes mellitus with diabetic peripheral angiopathy without gangrene: Secondary | ICD-10-CM | POA: Diagnosis present

## 2019-12-05 DIAGNOSIS — A4152 Sepsis due to Pseudomonas: Secondary | ICD-10-CM | POA: Diagnosis present

## 2019-12-05 DIAGNOSIS — K7200 Acute and subacute hepatic failure without coma: Secondary | ICD-10-CM | POA: Diagnosis present

## 2019-12-05 DIAGNOSIS — R0989 Other specified symptoms and signs involving the circulatory and respiratory systems: Secondary | ICD-10-CM

## 2019-12-05 DIAGNOSIS — G8929 Other chronic pain: Secondary | ICD-10-CM | POA: Diagnosis not present

## 2019-12-05 DIAGNOSIS — Y848 Other medical procedures as the cause of abnormal reaction of the patient, or of later complication, without mention of misadventure at the time of the procedure: Secondary | ICD-10-CM | POA: Diagnosis not present

## 2019-12-05 DIAGNOSIS — J90 Pleural effusion, not elsewhere classified: Secondary | ICD-10-CM | POA: Diagnosis not present

## 2019-12-05 DIAGNOSIS — F1190 Opioid use, unspecified, uncomplicated: Secondary | ICD-10-CM | POA: Diagnosis present

## 2019-12-05 DIAGNOSIS — R0902 Hypoxemia: Secondary | ICD-10-CM

## 2019-12-05 DIAGNOSIS — I959 Hypotension, unspecified: Secondary | ICD-10-CM | POA: Diagnosis not present

## 2019-12-05 DIAGNOSIS — I447 Left bundle-branch block, unspecified: Secondary | ICD-10-CM | POA: Diagnosis not present

## 2019-12-05 DIAGNOSIS — Z79899 Other long term (current) drug therapy: Secondary | ICD-10-CM

## 2019-12-05 DIAGNOSIS — Z89421 Acquired absence of other right toe(s): Secondary | ICD-10-CM

## 2019-12-05 DIAGNOSIS — J9600 Acute respiratory failure, unspecified whether with hypoxia or hypercapnia: Secondary | ICD-10-CM

## 2019-12-05 DIAGNOSIS — I351 Nonrheumatic aortic (valve) insufficiency: Secondary | ICD-10-CM | POA: Diagnosis not present

## 2019-12-05 DIAGNOSIS — E1122 Type 2 diabetes mellitus with diabetic chronic kidney disease: Secondary | ICD-10-CM | POA: Diagnosis present

## 2019-12-05 DIAGNOSIS — T502X5A Adverse effect of carbonic-anhydrase inhibitors, benzothiadiazides and other diuretics, initial encounter: Secondary | ICD-10-CM | POA: Diagnosis present

## 2019-12-05 DIAGNOSIS — Z8249 Family history of ischemic heart disease and other diseases of the circulatory system: Secondary | ICD-10-CM

## 2019-12-05 DIAGNOSIS — D696 Thrombocytopenia, unspecified: Secondary | ICD-10-CM | POA: Diagnosis present

## 2019-12-05 DIAGNOSIS — Z825 Family history of asthma and other chronic lower respiratory diseases: Secondary | ICD-10-CM

## 2019-12-05 DIAGNOSIS — M79606 Pain in leg, unspecified: Secondary | ICD-10-CM | POA: Diagnosis present

## 2019-12-05 DIAGNOSIS — J470 Bronchiectasis with acute lower respiratory infection: Secondary | ICD-10-CM | POA: Diagnosis present

## 2019-12-05 DIAGNOSIS — D6489 Other specified anemias: Secondary | ICD-10-CM | POA: Diagnosis present

## 2019-12-05 DIAGNOSIS — Z789 Other specified health status: Secondary | ICD-10-CM

## 2019-12-05 DIAGNOSIS — R64 Cachexia: Secondary | ICD-10-CM | POA: Diagnosis present

## 2019-12-05 DIAGNOSIS — Z20828 Contact with and (suspected) exposure to other viral communicable diseases: Secondary | ICD-10-CM | POA: Diagnosis present

## 2019-12-05 DIAGNOSIS — Z66 Do not resuscitate: Secondary | ICD-10-CM | POA: Diagnosis not present

## 2019-12-05 DIAGNOSIS — J9601 Acute respiratory failure with hypoxia: Secondary | ICD-10-CM | POA: Diagnosis present

## 2019-12-05 DIAGNOSIS — E785 Hyperlipidemia, unspecified: Secondary | ICD-10-CM | POA: Diagnosis not present

## 2019-12-05 DIAGNOSIS — I5043 Acute on chronic combined systolic (congestive) and diastolic (congestive) heart failure: Secondary | ICD-10-CM | POA: Diagnosis present

## 2019-12-05 DIAGNOSIS — Z978 Presence of other specified devices: Secondary | ICD-10-CM

## 2019-12-05 DIAGNOSIS — R6521 Severe sepsis with septic shock: Secondary | ICD-10-CM | POA: Diagnosis present

## 2019-12-05 DIAGNOSIS — L89891 Pressure ulcer of other site, stage 1: Secondary | ICD-10-CM | POA: Diagnosis present

## 2019-12-05 DIAGNOSIS — J15212 Pneumonia due to Methicillin resistant Staphylococcus aureus: Secondary | ICD-10-CM | POA: Diagnosis present

## 2019-12-05 DIAGNOSIS — A419 Sepsis, unspecified organism: Secondary | ICD-10-CM | POA: Diagnosis not present

## 2019-12-05 DIAGNOSIS — Z951 Presence of aortocoronary bypass graft: Secondary | ICD-10-CM

## 2019-12-05 DIAGNOSIS — N19 Unspecified kidney failure: Secondary | ICD-10-CM | POA: Diagnosis not present

## 2019-12-05 DIAGNOSIS — I953 Hypotension of hemodialysis: Secondary | ICD-10-CM | POA: Diagnosis not present

## 2019-12-05 DIAGNOSIS — M069 Rheumatoid arthritis, unspecified: Secondary | ICD-10-CM | POA: Diagnosis present

## 2019-12-05 DIAGNOSIS — Z515 Encounter for palliative care: Secondary | ICD-10-CM | POA: Diagnosis not present

## 2019-12-05 DIAGNOSIS — M05711 Rheumatoid arthritis with rheumatoid factor of right shoulder without organ or systems involvement: Secondary | ICD-10-CM | POA: Diagnosis not present

## 2019-12-05 DIAGNOSIS — I2510 Atherosclerotic heart disease of native coronary artery without angina pectoris: Secondary | ICD-10-CM | POA: Diagnosis present

## 2019-12-05 DIAGNOSIS — J9690 Respiratory failure, unspecified, unspecified whether with hypoxia or hypercapnia: Secondary | ICD-10-CM

## 2019-12-05 DIAGNOSIS — I350 Nonrheumatic aortic (valve) stenosis: Secondary | ICD-10-CM

## 2019-12-05 DIAGNOSIS — G9341 Metabolic encephalopathy: Secondary | ICD-10-CM | POA: Diagnosis present

## 2019-12-05 DIAGNOSIS — N289 Disorder of kidney and ureter, unspecified: Secondary | ICD-10-CM | POA: Diagnosis not present

## 2019-12-05 DIAGNOSIS — E1129 Type 2 diabetes mellitus with other diabetic kidney complication: Secondary | ICD-10-CM | POA: Diagnosis present

## 2019-12-05 DIAGNOSIS — E871 Hypo-osmolality and hyponatremia: Secondary | ICD-10-CM | POA: Diagnosis not present

## 2019-12-05 DIAGNOSIS — Z993 Dependence on wheelchair: Secondary | ICD-10-CM

## 2019-12-05 DIAGNOSIS — I442 Atrioventricular block, complete: Secondary | ICD-10-CM | POA: Diagnosis present

## 2019-12-05 DIAGNOSIS — J95851 Ventilator associated pneumonia: Secondary | ICD-10-CM | POA: Diagnosis not present

## 2019-12-05 DIAGNOSIS — Z7952 Long term (current) use of systemic steroids: Secondary | ICD-10-CM

## 2019-12-05 DIAGNOSIS — I517 Cardiomegaly: Secondary | ICD-10-CM | POA: Diagnosis not present

## 2019-12-05 DIAGNOSIS — K429 Umbilical hernia without obstruction or gangrene: Secondary | ICD-10-CM | POA: Diagnosis not present

## 2019-12-05 DIAGNOSIS — Z95828 Presence of other vascular implants and grafts: Secondary | ICD-10-CM

## 2019-12-05 DIAGNOSIS — G9340 Encephalopathy, unspecified: Secondary | ICD-10-CM

## 2019-12-05 DIAGNOSIS — Z882 Allergy status to sulfonamides status: Secondary | ICD-10-CM

## 2019-12-05 DIAGNOSIS — R05 Cough: Secondary | ICD-10-CM | POA: Diagnosis not present

## 2019-12-05 DIAGNOSIS — J9811 Atelectasis: Secondary | ICD-10-CM | POA: Diagnosis not present

## 2019-12-05 DIAGNOSIS — I509 Heart failure, unspecified: Secondary | ICD-10-CM | POA: Diagnosis not present

## 2019-12-05 DIAGNOSIS — M542 Cervicalgia: Secondary | ICD-10-CM | POA: Diagnosis present

## 2019-12-05 DIAGNOSIS — Z4682 Encounter for fitting and adjustment of non-vascular catheter: Secondary | ICD-10-CM | POA: Diagnosis not present

## 2019-12-05 DIAGNOSIS — Z4659 Encounter for fitting and adjustment of other gastrointestinal appliance and device: Secondary | ICD-10-CM

## 2019-12-05 DIAGNOSIS — G894 Chronic pain syndrome: Secondary | ICD-10-CM | POA: Diagnosis present

## 2019-12-05 DIAGNOSIS — M05712 Rheumatoid arthritis with rheumatoid factor of left shoulder without organ or systems involvement: Secondary | ICD-10-CM | POA: Diagnosis not present

## 2019-12-05 DIAGNOSIS — Z820 Family history of epilepsy and other diseases of the nervous system: Secondary | ICD-10-CM

## 2019-12-05 DIAGNOSIS — E875 Hyperkalemia: Secondary | ICD-10-CM | POA: Diagnosis present

## 2019-12-05 DIAGNOSIS — Z6827 Body mass index (BMI) 27.0-27.9, adult: Secondary | ICD-10-CM

## 2019-12-05 DIAGNOSIS — Z8701 Personal history of pneumonia (recurrent): Secondary | ICD-10-CM

## 2019-12-05 DIAGNOSIS — Z823 Family history of stroke: Secondary | ICD-10-CM

## 2019-12-05 DIAGNOSIS — Z7189 Other specified counseling: Secondary | ICD-10-CM | POA: Diagnosis not present

## 2019-12-05 DIAGNOSIS — I739 Peripheral vascular disease, unspecified: Secondary | ICD-10-CM | POA: Diagnosis present

## 2019-12-05 DIAGNOSIS — R404 Transient alteration of awareness: Secondary | ICD-10-CM | POA: Diagnosis not present

## 2019-12-05 DIAGNOSIS — I080 Rheumatic disorders of both mitral and aortic valves: Secondary | ICD-10-CM | POA: Diagnosis present

## 2019-12-05 DIAGNOSIS — D649 Anemia, unspecified: Secondary | ICD-10-CM | POA: Diagnosis not present

## 2019-12-05 LAB — URINALYSIS, ROUTINE W REFLEX MICROSCOPIC
Bilirubin Urine: NEGATIVE
Glucose, UA: NEGATIVE mg/dL
Hgb urine dipstick: NEGATIVE
Ketones, ur: NEGATIVE mg/dL
Leukocytes,Ua: NEGATIVE
Nitrite: NEGATIVE
Protein, ur: 30 mg/dL — AB
Specific Gravity, Urine: 1.016 (ref 1.005–1.030)
pH: 5 (ref 5.0–8.0)

## 2019-12-05 LAB — COMPREHENSIVE METABOLIC PANEL
ALT: 507 U/L — ABNORMAL HIGH (ref 0–44)
AST: 548 U/L — ABNORMAL HIGH (ref 15–41)
Albumin: 3 g/dL — ABNORMAL LOW (ref 3.5–5.0)
Alkaline Phosphatase: 145 U/L — ABNORMAL HIGH (ref 38–126)
Anion gap: 14 (ref 5–15)
BUN: 99 mg/dL — ABNORMAL HIGH (ref 8–23)
CO2: 21 mmol/L — ABNORMAL LOW (ref 22–32)
Calcium: 8.1 mg/dL — ABNORMAL LOW (ref 8.9–10.3)
Chloride: 94 mmol/L — ABNORMAL LOW (ref 98–111)
Creatinine, Ser: 4.04 mg/dL — ABNORMAL HIGH (ref 0.61–1.24)
GFR calc Af Amer: 16 mL/min — ABNORMAL LOW (ref >60)
GFR calc non Af Amer: 14 mL/min — ABNORMAL LOW (ref >60)
Glucose, Bld: 315 mg/dL — ABNORMAL HIGH (ref 70–99)
Potassium: 5.4 mmol/L — ABNORMAL HIGH (ref 3.5–5.1)
Sodium: 129 mmol/L — ABNORMAL LOW (ref 135–145)
Total Bilirubin: 2.7 mg/dL — ABNORMAL HIGH (ref 0.3–1.2)
Total Protein: 6.4 g/dL — ABNORMAL LOW (ref 6.5–8.1)

## 2019-12-05 LAB — CBC WITH DIFFERENTIAL/PLATELET
Abs Immature Granulocytes: 0.04 10*3/uL (ref 0.00–0.07)
Basophils Absolute: 0 10*3/uL (ref 0.0–0.1)
Basophils Relative: 0 %
Eosinophils Absolute: 0 10*3/uL (ref 0.0–0.5)
Eosinophils Relative: 0 %
HCT: 29.8 % — ABNORMAL LOW (ref 39.0–52.0)
Hemoglobin: 9.1 g/dL — ABNORMAL LOW (ref 13.0–17.0)
Immature Granulocytes: 1 %
Lymphocytes Relative: 4 %
Lymphs Abs: 0.3 10*3/uL — ABNORMAL LOW (ref 0.7–4.0)
MCH: 30.2 pg (ref 26.0–34.0)
MCHC: 30.5 g/dL (ref 30.0–36.0)
MCV: 99 fL (ref 80.0–100.0)
Monocytes Absolute: 0.5 10*3/uL (ref 0.1–1.0)
Monocytes Relative: 6 %
Neutro Abs: 7.1 10*3/uL (ref 1.7–7.7)
Neutrophils Relative %: 89 %
Platelets: 108 10*3/uL — ABNORMAL LOW (ref 150–400)
RBC: 3.01 MIL/uL — ABNORMAL LOW (ref 4.22–5.81)
RDW: 18.1 % — ABNORMAL HIGH (ref 11.5–15.5)
WBC: 7.9 10*3/uL (ref 4.0–10.5)
nRBC: 0.3 % — ABNORMAL HIGH (ref 0.0–0.2)

## 2019-12-05 LAB — AMMONIA: Ammonia: 30 umol/L (ref 9–35)

## 2019-12-05 LAB — ACID FAST CULTURE WITH REFLEXED SENSITIVITIES (MYCOBACTERIA): Acid Fast Culture: NEGATIVE

## 2019-12-05 LAB — LACTIC ACID, PLASMA
Lactic Acid, Venous: 2.7 mmol/L (ref 0.5–1.9)
Lactic Acid, Venous: 2.3 mmol/L (ref 0.5–1.9)

## 2019-12-05 MED ORDER — SODIUM CHLORIDE 0.9 % IV SOLN
100.0000 mg | Freq: Two times a day (BID) | INTRAVENOUS | Status: DC
  Administered 2019-12-06 (×2): 100 mg via INTRAVENOUS
  Filled 2019-12-05 (×11): qty 100

## 2019-12-05 MED ORDER — INSULIN ASPART 100 UNIT/ML IV SOLN
5.0000 [IU] | Freq: Once | INTRAVENOUS | Status: AC
  Administered 2019-12-05: 5 [IU] via INTRAVENOUS

## 2019-12-05 MED ORDER — SODIUM CHLORIDE 0.9 % IV SOLN
1.0000 g | INTRAVENOUS | Status: DC
  Administered 2019-12-05 – 2019-12-06 (×2): 1 g via INTRAVENOUS
  Filled 2019-12-05 (×2): qty 10

## 2019-12-05 MED ORDER — SODIUM CHLORIDE 0.9 % IV SOLN
INTRAVENOUS | Status: DC
  Administered 2019-12-06: 02:00:00 via INTRAVENOUS

## 2019-12-05 MED ORDER — SODIUM CHLORIDE 0.9 % IV BOLUS
500.0000 mL | Freq: Once | INTRAVENOUS | Status: AC
  Administered 2019-12-05: 19:00:00 500 mL via INTRAVENOUS

## 2019-12-05 MED ORDER — SODIUM CHLORIDE 0.9 % IV BOLUS
500.0000 mL | Freq: Once | INTRAVENOUS | Status: AC
  Administered 2019-12-05: 500 mL via INTRAVENOUS

## 2019-12-05 NOTE — ED Notes (Signed)
Date and time results received: 11/26/2019 2014   Test: Lactic Critical Value: 2.7  Name of Provider Notified: Ruthann Cancer, [place]

## 2019-12-05 NOTE — ED Notes (Signed)
Date and time results received: 12/14/2019 2110   Test: Lactic Critical Value: 2.3  Name of Provider Notified: Rogene Houston, MD

## 2019-12-05 NOTE — ED Triage Notes (Signed)
EMS reports pt has not been acting like himself x 3 days and has been having speech changes x 3 days to 1 week.  BP 90/60 manually with ems, temp 97.4, hr 102, cbg 379.  Pt uses a motorized wheel chair at home.  Pt very hard to understand.  Pt has pacemaker.  Pt appears very lethargic. Ems says wife told them pt usually got that way in the evening.  Pt on wait list for a valve replacement.

## 2019-12-05 NOTE — Progress Notes (Signed)
Synopsis: Referred in September 2020 for productive cough by [name] Kirschner, MD.  Subjective:   PATIENT ID: [name] GENDER: male DOB: 27-Apr-1951, MRN: [id number]  Chief Complaint  Patient presents with   Follow-up    Patient is having a productive cough with white sputum     I connected with  [name] on 11/30/2019 by a phone call and verified that I am speaking with the correct person using two identifiers.   I discussed the limitations of evaluation and management by telemedicine. The patient expressed understanding and agreed to proceed. I ultimately spoke more with his wife than him due to confusion.  Patient location: Home Provider location: Pulmonology office    [name] is a 68 y/o gentleman with a history of RA and lower lobe post pneumonia bronchiectasis who presents for follow up. He had cultures that grew Candida tropicalis, for which he was started on voriconazole a few weeks ago. He was evaluated by ID as well, who share concern for infection vs colonization of this organism. He is being evaluated for a TAVR for severe AS. His most recent echo demonstrated LV global dysfunction, likely due to valvular disease.   He is confused today and his wife gave most of his history. She reports that he has been talking in his sleep for the past 2 nights and hasn't been getting any rest. He did this in the past during times of stress. He has been sleeping more during the day and has been confused the past 2 days. He has not had fevers and she does not think he has an infection. His wife reports the following VS from today: This morning BP 100/72, HR 60 Lunch time  BP 115/96, HR 67 Doesn't check pulse ox  His cough has not improved since starting voriconazole or his airway clearance therapy. He has been using hypertonic saline nebs BID and using his flutter valve throughout the day. His sputum has been improved. His SOB is at baseline and he has not been wheezing. He has had  a normal appetite. He continues on plaquenil and low-dose prednisone. His RA is about the same.          OV 10/31/2019: [name] is a 68 year old gentleman with a history of rheumatoid arthritis, HFpEF, CAD, severe aortic stenosis who was recently admitted to the hospital for pre-TAVR evaluation.  Who presents for follow-up for chronic productive cough for the last 2 years.  He continues to have copious sputum throughout the day, worse in the morning.  Sputum is so thick that he has trouble clearing it from his throat.  He has not tried inhalers or Mucinex.  His breathing has been at baseline recently.  No fevers, chills, sweats, or other new symptoms.  No chest pain.  He has minimal leg edema, and elevates his leg at night.   OV 09/25/2019: [name] is a 68 year old gentleman with a history of severe coronary artery disease requiring four-vessel CABG in 2016, HFpEF, severe aortic stenosis, CKD, and rheumatoid arthritis currently on prednisone 35m and hydroxychloroquine who presents for evaluation of chronic cough with sputum production for past 2 years.  This started at a random time, not associated with an infection.  It has not worsened over time.  His sputum is usually gray, but sometimes green.  He expectorates sputum throughout the day, sometimes 1-2 times a day, sometimes 4 or more times per day.  He has had pneumonias in the past, but  no significant childhood lung infections.  He has no previous personal or family history of lung disease.  He has no history of cancer; he is a never smoker.  Although his rheumatoid arthritis regimen is minimal, in the past he has been on Remicade, methotrexate, and possibly other medications for more aggressive control.  He has had infectious complications in the past, limiting his ability to tolerate more immunosuppression.  He has chronic hoarseness, which he reports he was seen by an ENT in [place], where he was told he had "weak vocal cords".  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  He was recently hospitalized for permanent pacemaker placement in early September 2020, and had repeat hospitalization for AKI about a week ago.  Otherwise his health is at baseline.  He is able to ambulate with a left lower extremity prosthesis, and he has a motorized wheelchair.  His wife assists him due to disfigurement from rheumatoid arthritis.   Past Medical History:  Diagnosis Date   Aortic stenosis    ARF (acute renal failure) ([place]) 09/11/2019   Chronic back pain    Chronic combined systolic (congestive) and diastolic (congestive) heart failure ([place])    a. EF 30-35% in 2016 b. at 30-35% by echo in 08/2016 c. 06/2019: echo showing EF of 60-65% but found to have severe AS.    Chronic neck pain    CKD (chronic kidney disease), stage III    Complete heart block ([place]) 08/29/2019   a. s/p Medtronic dual chamber pacemeker implantation on 08/30/2019.   Coronary artery disease    a. s/p CABG in 06/2015 with LIMA-LAD, SVG-PDA, SVG-Intermediate   Diabetes mellitus    Diabetic neuropathy (HCC)    Hyperkalemia    Hypertension    Loculated pleural effusion 09/09/2016   Pancytopenia ([place]) 08/13/2014   Prolonged QT interval 08/14/2014   Possibly secondary to methadone and amitriptyline.   Rheumatoid arthritis(714.0)    Seizure ([place]) 08/13/2014   Pt denies     Family History  Problem Relation Age of Onset   Diabetes Father    Alzheimer's disease Father    Heart attack Brother        multiple brothers with MIs   COPD Brother    Hypertension Mother    Stroke Mother    Head & neck cancer Nephew      Past Surgical History:  Procedure Laterality Date   BELOW KNEE LEG AMPUTATION Left 06/216   left leg   CARDIAC SURGERY     FRACTURE SURGERY     MUSCLE BIOPSY  06/2016   PACEMAKER IMPLANT N/A 08/30/2019   Procedure: PACEMAKER IMPLANT;  Surgeon: Evans Lance, MD;  Location: [facility];  Service:  Cardiovascular;  Laterality: N/A;   right foot surgery-Toe amputations     RIGHT/LEFT HEART CATH AND CORONARY/GRAFT ANGIOGRAPHY N/A 09/03/2019   Procedure: RIGHT/LEFT HEART CATH AND CORONARY/GRAFT ANGIOGRAPHY;  Surgeon: Leonie Man, MD;  Location: [facility];  Service: Cardiovascular;  Laterality: N/A;   TEMPORARY PACEMAKER N/A 08/29/2019   Procedure: TEMPORARY PACEMAKER;  Surgeon: Sherren Mocha, MD;  Location: [facility];  Service: Cardiovascular;  Laterality: N/A;   TOTAL HIP ARTHROPLASTY Left     Social History   Socioeconomic History   Marital status: Married    Spouse name: Not on file   Number of children: 2   Years of education: Not on file   Highest education level: Not on  file  Occupational History   Occupation: Retired Architect  Tobacco Use   Smoking status: Never Smoker   Smokeless tobacco: Never Used  Substance and Sexual Activity   Alcohol use: No   Drug use: No   Sexual activity: Yes  Other Topics Concern   Not on file  Social History Narrative   Not on file   Social Determinants of Health   Financial Resource Strain:    Difficulty of Paying Living Expenses: Not on file  Food Insecurity:    Worried About Charity fundraiser in the Last Year: Not on file   [employer] of Food in the Last Year: Not on file  Transportation Needs:    Lack of Transportation (Medical): Not on file   Lack of Transportation (Non-Medical): Not on file  Physical Activity:    Days of Exercise per Week: Not on file   Minutes of Exercise per Session: Not on file  Stress:    Feeling of Stress : Not on file  Social Connections:    Frequency of Communication with Friends and Family: Not on file   Frequency of Social Gatherings with Friends and Family: Not on file   Attends Religious Services: Not on file   Active Member of Clubs or Organizations: Not on file   Attends Archivist Meetings: Not on file   Marital Status: Not on  file  Intimate Partner Violence:    Fear of Current or Ex-Partner: Not on file   Emotionally Abused: Not on file   Physically Abused: Not on file   Sexually Abused: Not on file     Allergies  Allergen Reactions   Sulfa Antibiotics Rash     Immunization History  Administered Date(s) Administered   Fluad Quad(high Dose 65+) 09/04/2019   H1N1 11/22/202009   Influenza, Seasonal, Injecte, Preservative Fre 10/20/2008, 12/11/2009, 11/17/2010, 11/21/2011, 12/12/2012   Influenza,inj,Quad PF,6+ Mos 10/08/2013, 10/01/2014, 10/24/2015, 11/20/2017, 10/16/2018   Influenza-Unspecified 09/29/2016, 10/16/2018, 09/04/2019   PPD Test 07/27/2016   Pneumococcal Polysaccharide-23 11/22/202009, 11/24/2011, 10/24/2015, 09/04/2019   Pneumococcal-Unspecified 10/03/2016    Outpatient Medications Prior to Visit  Medication Sig Dispense Refill   amitriptyline (ELAVIL) 50 MG tablet TAKE 1 AND 1/2 TABLETS BY MOUTH AT BEDTIME. (Patient taking differently: Take 75 mg by mouth at bedtime. ) 45 tablet 5   aspirin EC 81 MG tablet Take 81 mg by mouth daily.     atorvastatin (LIPITOR) 80 MG tablet TAKE ONE TABLET BY MOUTH ONCE DAILY. (Patient taking differently: Take 80 mg by mouth daily. ) 30 tablet 5   blood glucose meter kit and supplies KIT Dispense based on patient and insurance preference. Use up to 3 times daily as directed. DX:E11.9 1 each 0   Cholecalciferol (VITAMIN D3) 25 MCG (1000 UT) CHEW Chew 1,000 Units by mouth daily.      clonazePAM (KLONOPIN) 0.25 MG disintegrating tablet DISSOLVE 1 TABLET BY MOUTH DAILY AS NEEDED FOR ANXIETY. (Patient taking differently: Take 0.25 mg by mouth daily as needed (for anxiety- dissolve in the mouth). ) 20 tablet 1   docusate sodium (COLACE) 100 MG capsule Take 100 mg by mouth 3 (three) times daily.      DULoxetine (CYMBALTA) 30 MG capsule Take 30 mg by mouth at bedtime.      finasteride (PROSCAR) 5 MG tablet Take 5 mg by mouth daily.       hydroxychloroquine (PLAQUENIL) 200 MG tablet Take 200 mg by mouth at bedtime.      insulin  aspart protamine- aspart (NOVOLOG MIX 70/30) (70-30) 100 UNIT/ML injection Inject 12 units into skin in the morning  and 8 units evening (Patient taking differently: Inject 8-12 Units into the skin See admin instructions. Inject 12 units into the skin in the morning and 8 units in the evening) 10 mL 5   methadone (DOLOPHINE) 10 MG tablet Take 10 mg by mouth every 6 (six) hours.      methadone (DOLOPHINE) 5 MG tablet Take 5 mg by mouth 3 (three) times daily as needed (for breakthrough pain).      nitroGLYCERIN (NITROSTAT) 0.4 MG SL tablet Place 1 tablet (0.4 mg total) under the tongue every 5 (five) minutes x 3 doses as needed for chest pain. 25 tablet 3   polyethylene glycol powder (MIRALAX) powder Take 17 g by mouth 2 (two) times daily. (Patient taking differently: Take 17 g by mouth at bedtime. ) 255 g    Polyvinyl Alcohol-Povidone (REFRESH OP) Place 1 drop into both eyes 3 (three) times daily as needed (for dry eyes).      predniSONE (DELTASONE) 5 MG tablet TAKE 1 TABLET BY MOUTH ONCE A DAY WITH BREAKFAST. (Patient taking differently: Take 5 mg by mouth daily with breakfast. ) 30 tablet 5   pregabalin (LYRICA) 25 MG capsule TAKE ONE CAPSULE BY MOUTH AT BEDTIME. (Patient taking differently: Take 25 mg by mouth at bedtime. ) 30 capsule 5   Respiratory Therapy Supplies (FLUTTER) DEVI 1 each by Does not apply route 2 (two) times daily. 1 each 0   senna (SENOKOT) 8.6 MG TABS tablet Take 1 tablet by mouth 2 (two) times daily.     sodium chloride HYPERTONIC 3 % nebulizer solution Take by nebulization 2 (two) times daily. 750 mL 12   tamsulosin (FLOMAX) 0.4 MG CAPS capsule Take 1 capsule (0.4 mg total) by mouth at bedtime. 30 capsule 5   torsemide (DEMADEX) 10 MG tablet TAKE 2 TABLETS BY MOUTH EVERY OTHER DAY. (Patient taking differently: Take 20 mg by mouth every other day. ) 30 tablet 5   voriconazole  (VFEND) 200 MG tablet Take 1 tablet (200 mg total) by mouth 2 (two) times daily. 60 tablet 1   No facility-administered medications prior to visit.    Review of Systems  Constitutional: Negative.   HENT: Positive for congestion.   Respiratory: Positive for cough and sputum production.   Cardiovascular: Negative for chest pain and leg swelling.  Gastrointestinal: Negative.   Genitourinary: Negative.   Musculoskeletal: Positive for joint pain and myalgias.  Skin: Negative.   Neurological:       Confusion     Objective:   There were no vitals filed for this visit.   on   RA BMI Readings from Last 3 Encounters:  11/25/19 24.78 kg/m  11/12/19 24.81 kg/m  11/04/19 23.29 kg/m   Wt Readings from Last 3 Encounters:  11/25/19 163 lb (73.9 kg)  11/12/19 168 lb (76.2 kg)  11/04/19 160 lb (72.6 kg)    Physical Exam  Limited due to telephone visit: Weak voice, hoarseness-not significantly different from baseline. Mildly confused, answering questions slowly.  He was able to fully provide his birthday and address, but was not precise with details to answer questions about his health.  No apparent dyspnea with talking, no observed coughing.   CBC    Component Value Date/Time   WBC 6.6 10/27/2019 1845   RBC 3.02 (L) 10/27/2019 1845   HGB 9.2 (L) 10/27/2019 1845   HGB 8.7 (L)  09/10/2019 1218   HCT 28.8 (L) 10/27/2019 1845   HCT 25.8 (L) 09/10/2019 1218   PLT 133 (L) 10/27/2019 1845   PLT 158 09/10/2019 1218   MCV 95.4 10/27/2019 1845   MCV 91 09/10/2019 1218   MCH 30.5 10/27/2019 1845   MCHC 31.9 10/27/2019 1845   RDW 13.8 10/27/2019 1845   RDW 13.2 09/10/2019 1218   LYMPHSABS 2.1 09/14/2019 0524   MONOABS 0.6 09/14/2019 0524   EOSABS 0.5 09/14/2019 0524   BASOSABS 0.1 09/14/2019 0524    CHEMISTRY No results for input(s): NA, K, CL, CO2, GLUCOSE, BUN, CREATININE, CALCIUM, MG, PHOS in the last 168 hours. CrCl cannot be calculated (Patient's most recent lab result is  older than the maximum 21 days allowed.).  ABG 09/03/2019:  7.36/ 52/ 196/ 29, 100% saturation  IgE 1569 (elevated) IgM 57 IgG 1347 (WNL) IgG subclass subclass 4 mildly elevated, otherwise normal  Micro: 10/27/2019 Covid negative 10/08/2019 respiratory culture-normal flora 10/08/2019-Candida tropicalis 10/08/2019 AFB-negative  Chest Imaging- films reviewed: CXR 08/31/2019, 2 view- low lung volumes.  Right lower lobe opacity with silhouetting of the entire hemidiaphragm.  No obvious pleural effusion.  AICD in place.  Calcified vasculature.  Right lower lobe opacity has been present since July 2020; new since January 2020.  CT chest 10/16/2016- no obvious mediastinal or hilar lymphadenopathy.  Patulous esophagus.  Significant vascular calcifications.  Subpleural pulmonary nodules.  Subpleural scarring in the posterior lateral right lung.  Scarring in bilateral lower lobes with pleural tags.  Consolidation in a bronchovascular pattern in the right lower lobe with air bronchograms.  Elevated right hemidiaphragm.  CT chest 10/10/2019-vairway thickening in upper lobes, prominent bronchiectasis in bilateral lower lobes associated with right lower lobe opacity. Scattered peripheral right upper lobe opacities, patulous esophagus.  No significant mediastinal adenopathy.  Large PA and LA.  Pulmonary Functions Testing Results: No flowsheet data found.    Echocardiogram 11/08/2019: LVEF 40-45 % with mild LVH.  Severely dilated LA. normal RV systolic function.  Severe aortic stenosis and moderate regurgitation, mild to moderate MR and mild stenosis. Mean AVA 0.57 cm.  Heart Catheterization 08/29/2019:  Fick Cardiac Output 6.47 L/min  Fick Cardiac Output Index 3.43 (L/min)/BSA  Aortic Mean Gradient 30.95 mmHg  Aortic Peak Gradient 38 mmHg  Aortic Valve Area 1.14  Aortic Value Area Index 0.6 cm2/BSA  RA A Wave 3 mmHg  RA V Wave 2 mmHg  RA Mean 0 mmHg  RV Systolic Pressure 20 mmHg  RV Diastolic  Pressure -2 mmHg  RV EDP 1 mmHg  PA Systolic Pressure 23 mmHg  PA Diastolic Pressure 3 mmHg  PA Mean 11 mmHg  PW A Wave 3 mmHg  PW V Wave 6 mmHg  PW Mean 2 mmHg  AO Systolic Pressure 818 mmHg  AO Diastolic Pressure 51 mmHg  AO Mean 73 mmHg  LV Systolic Pressure 563 mmHg  LV Diastolic Pressure 6 mmHg  LV EDP 9 mmHg  AOp Systolic Pressure 149 mmHg  AOp Diastolic Pressure 46 mmHg  AOp Mean Pressure 73 mmHg  LVp Systolic Pressure 702 mmHg  LVp Diastolic Pressure 8 mmHg  LVp EDP Pressure 16 mmHg  QP/QS 1  TPVR Index 3.21 HRUI  TSVR Index 21.27 HRUI  TPVR/TSVR Ratio 0.15   Heart catheterization 09/03/2019   Severe 4-vessel native CAD with 100% flush CTO of the LAD, RI and RCA with 60% stenosis of ostial OM1 and ostial PDA (fill via SVG-PDA)  Widely patent LIMA-LAD, SVG-RI with moderate ostial and  proximal disease in SVG-RPDA (retrograde flow from RPDA 2 RPL system somewhat jeopardized by ostial 75% PDA lesion-not PCI target)  Echocardiographically Severe Aortic Stenosis -> with direct measurements of P-P pressure 38 million mercury and mean 31 mmHg.  Normal CARDIAC OUTPUT & CARDIAC INDEX  Relatively normal RIGHT HEART CATH pressures    Assessment & Plan:     ICD-10-CM   1. Encephalopathy  G93.40   2. Candida tropicalis infection  B37.9   3. Bronchiectasis with acute lower respiratory infection ([place])  J47.0      Encephalopathy of unclear etiology -I discussed my concerns with his wife that this likely needs to be evaluated.  She is concerned that this is due to sleep deprivation, but she agreed that if this worsens or fails to improve tomorrow she would have him evaluated in the emergency department.  He has a history of Pseudomonas UTI and is at risk for pulmonary infections with his bronchiectasis and immunosuppression.  She will continue to monitor his vital signs at home.   Bilateral lower lobe bronchiectasis, most prominent in the right lower lobe associated with  peripheral opacification.  Likely this is a sequela of previous aspirations and/or infections.  Bronchiectasis is responsible for his chronic productive cough.  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  No evidence for immunoglobulin deficiency. -I appreciate Dr. Hale Bogus input from ID.  Continue voriconazole 200 mg twice daily for 4 weeks.   -Repeat sputum fungus culture in about 2 weeks.  We will call to remind them to pick up a sputum cup. -Continue airway clearance therapy regimen-hypertonic saline nebs twice daily with his flutter valve.  He is free to use his flutter valve as often as he likes. -I educated Mr. and Mrs. Pickrell that with bronchiectasis his cough is likely to persist, but the goal of his treatment is to prevent recurrent infections or progression of his bronchiectasis. -No PFTs due to severe aortic stenosis at this point  RA on chronic immunosuppression-currently on low-dose prednisone & plaquenil and his maintenance therapy at this point is mostly pain control. -Has never had films to evaluate atlantoaxial instability  -Up-to-date on seasonal flu and pneumonia vaccines  Severe AS with HFrEF. With his history of chronic hoarseness, I wonder if this is due to Ortner's. -Continue TAVR work-up per cardiology.  My preference would be to reevaluate his sputum culture after his course of antifungals to determine if he has responded to treatment.  I appreciate their collaboration on his case.  RTC 4 weeks.  > 20 minutes spent on this encounter, with >50% of this time spent speaking with the patient or his wife.   Current Outpatient Medications:    amitriptyline (ELAVIL) 50 MG tablet, TAKE 1 AND 1/2 TABLETS BY MOUTH AT BEDTIME. (Patient taking differently: Take 75 mg by mouth at bedtime. ), Disp: 45 tablet, Rfl: 5   aspirin EC 81 MG tablet, Take 81 mg by mouth daily., Disp: , Rfl:    atorvastatin (LIPITOR) 80 MG tablet, TAKE ONE TABLET BY MOUTH ONCE DAILY.  (Patient taking differently: Take 80 mg by mouth daily. ), Disp: 30 tablet, Rfl: 5   blood glucose meter kit and supplies KIT, Dispense based on patient and insurance preference. Use up to 3 times daily as directed. DX:E11.9, Disp: 1 each, Rfl: 0   Cholecalciferol (VITAMIN D3) 25 MCG (1000 UT) CHEW, Chew 1,000 Units by mouth daily. , Disp: , Rfl:    clonazePAM (KLONOPIN) 0.25 MG disintegrating tablet, DISSOLVE 1 TABLET BY  MOUTH DAILY AS NEEDED FOR ANXIETY. (Patient taking differently: Take 0.25 mg by mouth daily as needed (for anxiety- dissolve in the mouth). ), Disp: 20 tablet, Rfl: 1   docusate sodium (COLACE) 100 MG capsule, Take 100 mg by mouth 3 (three) times daily. , Disp: , Rfl:    DULoxetine (CYMBALTA) 30 MG capsule, Take 30 mg by mouth at bedtime. , Disp: , Rfl:    finasteride (PROSCAR) 5 MG tablet, Take 5 mg by mouth daily. , Disp: , Rfl:    hydroxychloroquine (PLAQUENIL) 200 MG tablet, Take 200 mg by mouth at bedtime. , Disp: , Rfl:    insulin aspart protamine- aspart (NOVOLOG MIX 70/30) (70-30) 100 UNIT/ML injection, Inject 12 units into skin in the morning  and 8 units evening (Patient taking differently: Inject 8-12 Units into the skin See admin instructions. Inject 12 units into the skin in the morning and 8 units in the evening), Disp: 10 mL, Rfl: 5   methadone (DOLOPHINE) 10 MG tablet, Take 10 mg by mouth every 6 (six) hours. , Disp: , Rfl:    methadone (DOLOPHINE) 5 MG tablet, Take 5 mg by mouth 3 (three) times daily as needed (for breakthrough pain). , Disp: , Rfl:    nitroGLYCERIN (NITROSTAT) 0.4 MG SL tablet, Place 1 tablet (0.4 mg total) under the tongue every 5 (five) minutes x 3 doses as needed for chest pain., Disp: 25 tablet, Rfl: 3   polyethylene glycol powder (MIRALAX) powder, Take 17 g by mouth 2 (two) times daily. (Patient taking differently: Take 17 g by mouth at bedtime. ), Disp: 255 g, Rfl:    Polyvinyl Alcohol-Povidone (REFRESH OP), Place 1 drop into both  eyes 3 (three) times daily as needed (for dry eyes). , Disp: , Rfl:    predniSONE (DELTASONE) 5 MG tablet, TAKE 1 TABLET BY MOUTH ONCE A DAY WITH BREAKFAST. (Patient taking differently: Take 5 mg by mouth daily with breakfast. ), Disp: 30 tablet, Rfl: 5   pregabalin (LYRICA) 25 MG capsule, TAKE ONE CAPSULE BY MOUTH AT BEDTIME. (Patient taking differently: Take 25 mg by mouth at bedtime. ), Disp: 30 capsule, Rfl: 5   Respiratory Therapy Supplies (FLUTTER) DEVI, 1 each by Does not apply route 2 (two) times daily., Disp: 1 each, Rfl: 0   senna (SENOKOT) 8.6 MG TABS tablet, Take 1 tablet by mouth 2 (two) times daily., Disp: , Rfl:    sodium chloride HYPERTONIC 3 % nebulizer solution, Take by nebulization 2 (two) times daily., Disp: 750 mL, Rfl: 12   tamsulosin (FLOMAX) 0.4 MG CAPS capsule, Take 1 capsule (0.4 mg total) by mouth at bedtime., Disp: 30 capsule, Rfl: 5   torsemide (DEMADEX) 10 MG tablet, TAKE 2 TABLETS BY MOUTH EVERY OTHER DAY. (Patient taking differently: Take 20 mg by mouth every other day. ), Disp: 30 tablet, Rfl: 5   voriconazole (VFEND) 200 MG tablet, Take 1 tablet (200 mg total) by mouth 2 (two) times daily., Disp: 60 tablet, Rfl: 1   Julian Hy, DO [facility] 12/20/2019 4:03 PM

## 2019-12-05 NOTE — Patient Instructions (Addendum)
Thank you for visiting Dr. Carlis Abbott at [facility]. We recommend the following:  Come pick up sputum cup for repeat sputum culture the week before Christmas (we will call you to remind you). The sputum cup will then be in the front of the office.  If your symptoms are getting worse, please let us know.   Return in about 1 month (around 01/05/2020).    Please do your part to reduce the spread of COVID-19.

## 2019-12-05 NOTE — H&P (Signed)
History and Physical    [name] JTT:[id number] DOB: October 10, 1951 DOA: 12/14/2019  PCP: [name], MD   Patient coming from: Home  I have personally briefly reviewed patient's old medical records in [place]  Chief Complaint: Altered mental status  HPI: [name] is a 68 y.o. male with medical history significant for rheumatoid arthritis, hypertension, coronary artery disease, diabetes mellitus, diastolic and systolic CHF, severe aortic stenosis, CKD 3, left above-knee amputation History is obtained from the spouse who is present at bedside.  At time of my evaluation patient is lethargic, would intermittently open his eyes but not responding to questions.  Spouse reports that over the past 3 days patient has been getting increasingly confused.  She reports at night patient barely sleeps, talking for most of the night, and during the daytime is drowsy and sleepy barely responding to her.  He has a chronic cough-present for about 2 years, mostly unchanged.  Productive of whitish to gray sputum.  No difficulty breathing.  No fever no chills.  He was started on voriconazole just over a month ago for this infection involving his lungs. Spouse reports compliance with his torsemide every other day.  She denies vomiting or loose stools, and reports good p.o. intake over the past few days, despite confusion   At baseline he sometimes confused, his episodes occur at night.  He ambulates with a wheelchair.   ED Course: Systolic 00-923, intermittent tachycardia to 105, O2 sats greater than 95% on room air.  UA rare bacteria.  Creatinine elevated 4.04, elevated liver enzymes, sodium 129, WBC 7.9.  Head CT negative for acute abnormality.  Portable chest x-ray-patchy opacity identified in the left perihilar region and right lung base pneumonia not excluded.  Blood cultures, obtained.  Abdominal CT without contrast ordered and pending for elevated liver enzymes.  500 mill normal saline bolus  given.  Hospitalist to admit for further evaluation and management.  Review of Systems: Unable to ascertain due to altered mental status.  Past Medical History:  Diagnosis Date  . Aortic stenosis   . ARF (acute renal failure) ([place]) 09/11/2019  . Chronic back pain   . Chronic combined systolic (congestive) and diastolic (congestive) heart failure (HCC)    a. EF 30-35% in 2016 b. at 30-35% by echo in 08/2016 c. 06/2019: echo showing EF of 60-65% but found to have severe AS.   [name] Chronic neck pain   . CKD (chronic kidney disease), stage III   . Complete heart block ([place]) 08/29/2019   a. s/p Medtronic dual chamber pacemeker implantation on 08/30/2019.  [name] Coronary artery disease    a. s/p CABG in 06/2015 with LIMA-LAD, SVG-PDA, SVG-Intermediate  . Diabetes mellitus   . Diabetic neuropathy ([place])   . Hyperkalemia   . Hypertension   . Loculated pleural effusion 09/09/2016  . Pancytopenia ([place]) 08/13/2014  . 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  . Rheumatoid arthritis(714.0)   . Seizure ([place]) 08/13/2014   Pt denies    Past Surgical History:  Procedure Laterality Date  . 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 CARDIAC SURGERY    . FRACTURE SURGERY    . MUSCLE BIOPSY  06/2016  . PACEMAKER IMPLANT N/A 08/30/2019   Procedure: PACEMAKER IMPLANT;  Surgeon: Evans Lance, MD;  Location: [facility];  Service: Cardiovascular;  Laterality: N/A;  . right foot surgery-Toe amputations    .  RIGHT/LEFT HEART CATH AND CORONARY/GRAFT ANGIOGRAPHY N/A 09/03/2019   Procedure: RIGHT/LEFT HEART CATH AND CORONARY/GRAFT ANGIOGRAPHY;  Surgeon: Leonie Man, MD;  Location: [facility];  Service: Cardiovascular;  Laterality: N/A;  . TEMPORARY PACEMAKER N/A 08/29/2019   Procedure: TEMPORARY PACEMAKER;  Surgeon: Sherren Mocha, MD;  Location: [facility];  Service: Cardiovascular;  Laterality: N/A;  . TOTAL HIP ARTHROPLASTY Left       reports that he has never smoked. He has never used smokeless tobacco. He reports that he does not drink alcohol or use drugs.  Allergies  Allergen Reactions  . Sulfa Antibiotics Rash    Family History  Problem Relation Age of Onset  . Diabetes Father   . Alzheimer's disease Father   . Heart attack Brother        multiple brothers with MIs  . COPD Brother   . Hypertension Mother   . Stroke Mother   . Head & neck cancer Nephew     Prior to Admission medications   Medication Sig Start Date End Date Taking? Authorizing Provider  amitriptyline (ELAVIL) 50 MG tablet TAKE 1 AND 1/2 TABLETS BY MOUTH AT BEDTIME. Patient taking differently: Take 75 mg by mouth at bedtime.  05/06/19  Yes [name], MD  aspirin EC 81 MG tablet Take 81 mg by mouth daily.   Yes [provider]  atorvastatin (LIPITOR) 80 MG tablet TAKE ONE TABLET BY MOUTH ONCE DAILY. Patient taking differently: Take 80 mg by mouth daily.  06/03/19  Yes [name], MD  Cholecalciferol (VITAMIN D3) 25 MCG (1000 UT) CHEW Chew 1,000 Units by mouth daily.    Yes [provider]  clonazePAM (KLONOPIN) 0.25 MG disintegrating tablet DISSOLVE 1 TABLET BY MOUTH DAILY AS NEEDED FOR ANXIETY. Patient taking differently: Take 0.25 mg by mouth daily as needed (for anxiety- dissolve in the mouth).  07/22/19  Yes [name], MD  docusate sodium (COLACE) 100 MG capsule Take 100 mg by mouth 3 (three) times daily.    Yes [provider]  DULoxetine (CYMBALTA) 30 MG capsule Take 30 mg by mouth at bedtime.  10/16/18 12/18/2019 Yes [provider]  finasteride (PROSCAR) 5 MG tablet Take 5 mg by mouth daily.  04/24/18  Yes [provider]  hydroxychloroquine (PLAQUENIL) 200 MG tablet Take 200 mg by mouth daily.   Yes [provider]  insulin aspart protamine- aspart (NOVOLOG MIX 70/30) (70-30) 100 UNIT/ML injection Inject 12 units into skin in the morning  and 8 units  evening Patient taking differently: Inject 8-12 Units into the skin See admin instructions. Inject 12 units into the skin in the morning and 8 units in the evening 05/06/19  Yes [name], MD  methadone (DOLOPHINE) 10 MG tablet Take 10 mg by mouth every 6 (six) hours.  04/18/18  Yes [provider]  methadone (DOLOPHINE) 5 MG tablet Take 5 mg by mouth 3 (three) times daily as needed (for breakthrough pain).  04/18/18  Yes [provider]  Polyvinyl Alcohol-Povidone (REFRESH OP) Place 1 drop into both eyes 3 (three) times daily as needed (for dry eyes).    Yes [provider]  predniSONE (DELTASONE) 5 MG tablet TAKE 1 TABLET BY MOUTH ONCE A DAY WITH BREAKFAST. Patient taking differently: Take 5 mg by mouth daily with breakfast.  06/03/19  Yes [name], MD  pregabalin (LYRICA) 25 MG capsule TAKE ONE CAPSULE BY MOUTH AT BEDTIME. Patient taking differently: Take  25 mg by mouth at bedtime.  06/03/19  Yes [name], MD  sodium chloride HYPERTONIC 3 % nebulizer solution Take by nebulization 2 (two) times daily. 10/31/19  Yes Noemi Chapel P, DO  tamsulosin (FLOMAX) 0.4 MG CAPS capsule Take 1 capsule (0.4 mg total) by mouth at bedtime. 05/06/19  Yes [name], MD  torsemide (DEMADEX) 10 MG tablet TAKE 2 TABLETS BY MOUTH EVERY OTHER DAY. Patient taking differently: Take 20 mg by mouth every other day.  05/06/19  Yes [name], MD  voriconazole (VFEND) 200 MG tablet Take 1 tablet (200 mg total) by mouth 2 (two) times daily. 10/31/19  Yes Noemi Chapel P, DO  blood glucose meter kit and supplies KIT Dispense based on patient and insurance preference. Use up to 3 times daily as directed. DX:E11.9 08/21/19   [name], MD  nitroGLYCERIN (NITROSTAT) 0.4 MG SL tablet Place 1 tablet (0.4 mg total) under the tongue every 5 (five) minutes x 3 doses as needed for chest pain. 09/04/19   [facility], Tami Lin, PA  Respiratory Therapy Supplies (FLUTTER) DEVI 1 each  by Does not apply route 2 (two) times daily. 10/31/19   Julian Hy, DO    Physical Exam: Exam limited by patient's mental status Vitals:   12/18/2019 2000 12/22/2019 2030 12/03/2019 2109 12/02/2019 2200  BP: (!) 101/48 95/67 118/63 103/73  Pulse: 98 (!) 101 99 (!) 101  Resp: '14 16 13 13  ' Temp:      TempSrc:      SpO2: 100% 100% 100% 100%    Constitutional: Lethargic drowsy,, intermittently opens his eyes to touch movements. Vitals:   12/06/2019 2000 12/04/2019 2030 12/14/2019 2109  2200  BP: (!) 101/48 95/67 118/63 103/73  Pulse: 98 (!) 101 99 (!) 101  Resp: '14 16 13 13  ' Temp:      TempSrc:      SpO2: 100% 100% 100% 100%   Eyes: PERRL, lids and conjunctivae normal ENMT: Mucous membranes are dry Posterior pharynx clear of any exudate or lesions. Neck: normal, supple, no masses, no thyromegaly Respiratory: clear to auscultation bilaterally, no wheezing, no crackles. Normal respiratory effort. No accessory muscle use.  Cardiovascular: Regular rate and rhythm, 3/6 systolic murmur aortic area, no rubs / gallops. No extremity edema. 2+ pedal pulses. No carotid bruits.  Abdomen: no tenderness, no masses palpated. No hepatosplenomegaly. Bowel sounds positive.  Musculoskeletal: no clubbing / cyanosis.  Left BKA.  Big toe only present on right foot, 4 toes amputated.  Hands of upper extremity showing signs of rheumatoid arthritis. Skin: no rashes, lesions, ulcers. No induration Neurologic: No gross cranial nerve abnormality, exam limited by patient's mental status. Psychiatric: Unable to fully assess due to altered mental status  Labs on Admission: I have personally reviewed following labs and imaging studies  CBC: Recent Labs  Lab 12/22/2019 1922  WBC 7.9  NEUTROABS 7.1  HGB 9.1*  HCT 29.8*  MCV 99.0  PLT 950*   Basic Metabolic Panel: Recent Labs  Lab 12/14/2019 1922  NA 129*  K 5.4*  CL 94*  CO2 21*  GLUCOSE 315*  BUN 99*  CREATININE 4.04*  CALCIUM 8.1*   GFR: Estimated  Creatinine Clearance: 16.5 mL/min (A) (by C-G formula based on SCr of 4.04 mg/dL (H)). Liver Function Tests: Recent Labs  Lab 12/21/2019 1922  AST 548*  ALT 507*  ALKPHOS 145*  BILITOT 2.7*  PROT 6.4*  ALBUMIN 3.0*   No results for input(s): LIPASE,  AMYLASE in the last 168 hours. Recent Labs  Lab 12/12/2019 2018  AMMONIA 30   BNP (last 3 results) Recent Labs    09/10/19 1218  PROBNP 15,761*   Urine analysis:    Component Value Date/Time   COLORURINE AMBER (A) 12/16/2019 1847   APPEARANCEUR HAZY (A) 12/15/2019 1847   LABSPEC 1.016 12/19/2019 1847   PHURINE 5.0 12/23/2019 1847   GLUCOSEU NEGATIVE 12/14/2019 1847   HGBUR NEGATIVE 12/19/2019 1847   BILIRUBINUR NEGATIVE 12/23/2019 1847   KETONESUR NEGATIVE 12/04/2019 1847   PROTEINUR 30 (A) 12/14/2019 1847   UROBILINOGEN 0.2 08/12/2014 2358   NITRITE NEGATIVE 12/16/2019 1847   LEUKOCYTESUR NEGATIVE 12/09/2019 1847    Radiological Exams on Admission: CT Head Wo Contrast  Result Date: 12/03/2019 CLINICAL DATA:  Encephalopathy EXAM: CT HEAD WITHOUT CONTRAST TECHNIQUE: Contiguous axial images were obtained from the base of the skull through the vertex without intravenous contrast. COMPARISON:  None. FINDINGS: Brain: There is no mass, hemorrhage or extra-axial collection. The size and configuration of the ventricles and extra-axial CSF spaces are normal. There is hypoattenuation of the white matter, most commonly indicating chronic small vessel disease. Vascular: Atherosclerotic calcification of the vertebral and internal carotid arteries at the skull base. No abnormal hyperdensity of the major intracranial arteries or dural venous sinuses. Skull: The visualized skull base, calvarium and extracranial soft tissues are normal. Sinuses/Orbits: No fluid levels or advanced mucosal thickening of the visualized paranasal sinuses. No mastoid or middle ear effusion. The orbits are normal. IMPRESSION: Findings of chronic small vessel disease  without acute intracranial abnormality. Electronically Signed   By: Ulyses Jarred M.D.   On: 11/27/2019 19:28   DG Chest Port 1 View  Result Date: 12/26/2019 CLINICAL DATA:  Chronic cough and shortness of breath EXAM: PORTABLE CHEST 1 VIEW COMPARISON:  September 11, 2019 FINDINGS: Patchy opacities are identified in the left suprahilar region, right lung base. Elevated right hemidiaphragm is identified, chronic. There is no pleural effusion. Cardiac pacemaker is unchanged. The heart size is enlarged. IMPRESSION: Patchy opacity identified in the left perihilar region and right lung base, pneumonia not excluded. Electronically Signed   By: Abelardo Diesel M.D.   On: 12/01/2019 19:58    EKG: Independently reviewed.  Left bundle branch block.  Pacer spikes present.  Assessment/Plan Active Problems:   AKI (acute kidney injury) ([place])   Acute kidney injury on chronic kidney disease stage III- likely secondary to diuretics torsemide-20 mg every 48 hours, likely poor p.o. intake also.  No GI fluid loss history. With lactic acidosis 2.7 > 2.3. - 500Mls N/s bolus given, repeat bolus, continue normal saline 100cc/hr x 15 hrs - BMP a.m -Hold home torsemide -Trend lactic acid  Hyponatremia-sodium 129.  Likely from diuretics. -Hydrate  Probable pneumonia-  chronic unchanged productive cough, WBC 7.9.  Chest x-ray patchy opacity left perihilar and right lung base. -IV ceftriaxone and doxycycline ( QTc prolonged at 494) -Swallow evaluation -CBC, BMP a.m. -Follow-up Covid test  Metabolic encephalopathy-likely secondary to acute kidney injury, probable pneumonia.  Head CT negative for acute abnormality.  Normal ammonia level.  At baseline ambulates with wheelchair, hx of intermittent confusion. -IV antibiotics, hydrate -Follow-up blood cultures, obtain urine cultures. -N.p.o. for now, hold home psychoactive medications including Cymbalta, Lyrica, low-dose clonazepam, methadone.  Elevated liver  enzymes-elevated, but more elevated today with AST at 548, ALT 507, ALP 145, T bili of 2.7.  Benign abdominal exam.  Afebrile without leukocytosis.  Has been on voriconazole ~ 1 month. -  CT abdomen and pelvis without contrast ordered in the ED, pending -Hold home voriconazole  Diastolic and systolic CHF-stable and compensated, with AKI requiring IV fluids.  Last echo 10/2019 EF 40 to 45%, indeterminate left ventricular diastolic parameters. -Hydrate -Hold aspirin and statins while n.p.o.  Diabetes mellitus-random glucose 315 - SSI -Hold home 70/30 insulin while n.p.o.  Bronchiectasis-follows with pulmonologist Dr. Carlis Abbott.  Deems patient's chronic productive cough is secondary to bronchiectasis.  Sputum cultures recently grew Candida tropicalis.  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 -Hold home voriconazole while n.p.o. and with increasing liver enzymes -IV ceftriaxone and azithromycin  Severe aortic stenosis-follows with Dr. [name], valvular heart disease clinic.  Rheumatoid arthritis-has chronic deformity of small joints. -N.p.o. for now resume home medications-chronic, chronic prednisone when able  Hypertension-soft. -Hydrate  History of coronary artery disease, pacemaker status- hx of CABG, 08/2019 cath: severe 4 vessel native disease. Patent LIMA-LAD, SVG-RI, moderate prox disease of SVG-RPDA.  EKG today showing left bundle branch block, pacer spikes present.  08/2019- he had intermittent heart block  and had a pacemaker placed. -Unable to interrogate pacemaker.  -Hold aspirin and statins while n.p.o.  Chronic pain-  -Hold home methadone while n.p.o.   DVT prophylaxis: Heparin Code Status: Full code, confirmed with spouse at bedside Family Communication: Spouse at bedside  disposition Plan: Per rounding team Consults called: None Admission status: Obs, telemetry   [name] Roys MD [facility]  12/04/2019, 11:41 PM

## 2019-12-05 NOTE — ED Provider Notes (Signed)
[facility] Provider Note   CSN: [id number] Arrival date & time: 12/15/2019  1801     History Chief Complaint  Patient presents with   Altered Mental Status    [name] is a 68 y.o. male with hx of CHF, bronchiectasis, severe AS, CKD, CHB s/p pacemaker, DM who presents with altered mental status.  Patient is unable to contribute any history at this time.  His wife is at bedside and states for the past 2 to 3 days he has been "talking nonstop" in his sleep.  This morning she noted that the patient was more altered than normal and she could not understand anything that he said.  He kept slumping to the right side.  She also noted that his urine has been very dark.  She states that he has been eating and drinking well although sometimes he refuses to drink fluids due to his history of heart failure.  He has not been complaining of any pain.  He has not had a fever. She has not noticed that one side is weaker than the other. He has a chronic cough which is unchanged and they actually saw their pulmonologist today via tele-visit. No vomiting or diarrhea. EMS noted patient was hypotensive and had significantly elevated blood sugars.  They gave a 500 cc bolus.  Patient is understandable at baseline and is wheelchair-bound.  Level 5 caveat due to altered mental status  HPI   Past Medical History:  Diagnosis Date   Aortic stenosis    ARF (acute renal failure) ([place]) 09/11/2019   Chronic back pain    Chronic combined systolic (congestive) and diastolic (congestive) heart failure ([place])    a. EF 30-35% in 2016 b. at 30-35% by echo in 08/2016 c. 06/2019: echo showing EF of 60-65% but found to have severe AS.    Chronic neck pain    CKD (chronic kidney disease), stage III    Complete heart block ([place]) 08/29/2019   a. s/p Medtronic dual chamber pacemeker implantation on 08/30/2019.   Coronary artery disease    a. s/p CABG in 06/2015 with LIMA-LAD, SVG-PDA,  SVG-Intermediate   Diabetes mellitus    Diabetic neuropathy (HCC)    Hyperkalemia    Hypertension    Loculated pleural effusion 09/09/2016   Pancytopenia ([place]) 08/13/2014   Prolonged QT interval 08/14/2014   Possibly secondary to methadone and amitriptyline.   Rheumatoid arthritis(714.0)    Seizure ([place]) 08/13/2014   Pt denies    Patient Active Problem List   Diagnosis Date Noted   Thrombocytopenia ([place]) 11/25/2019   Bronchiectasis ([place]) 11/25/2019   Peripheral artery disease ([place]) 11/25/2019   DJD (degenerative joint disease) 11/25/2019   Elevated IgE level 11/25/2019   Type 2 diabetes mellitus with peripheral vascular disease ([place]) 09/11/2019   Chronic diastolic heart failure ([place]) 09/04/2019   Pressure injury of skin 09/04/2019   Pacemaker    Severe aortic stenosis by prior echocardiogram    Heart block AV third degree ([place])    Opioid dependence on maintenance agonist therapy, no symptoms ([place]) 07/01/2019   Elevated troponin 07/01/2019   LBBB (left bundle branch block) 07/01/2019   Current chronic use of systemic steroids 07/01/2019   Loculated pleural effusion 09/09/2016   S/P BKA (below knee amputation) unilateral, left ([place]) 08/09/2016   Status post amputation of toe of left foot ([place]) 25/00/3704   Systolic heart failure ([place]) 07/09/2015   Prolonged QT interval 08/14/2014   Gait disorder 08/14/2014  Seizure ([place]) 08/13/2014   Normocytic anemia 08/13/2014   Closed dislocation of metatarsophalangeal (joint) 03/26/2013   Insulin dependent diabetes mellitus ([place]) 12/11/2012   Hyperlipidemia 12/11/2012   Coronary artery disease    Rheumatoid arthritis ([place])    Hypertension     Past Surgical History:  Procedure Laterality Date   BELOW KNEE LEG AMPUTATION Left 06/216   left leg   CARDIAC SURGERY     FRACTURE SURGERY     MUSCLE BIOPSY  06/2016   PACEMAKER IMPLANT N/A 08/30/2019   Procedure: PACEMAKER IMPLANT;  Surgeon: Evans Lance, MD;  Location: [facility];  Service: Cardiovascular;  Laterality: N/A;   right foot surgery-Toe amputations     RIGHT/LEFT HEART CATH AND CORONARY/GRAFT ANGIOGRAPHY N/A 09/03/2019   Procedure: RIGHT/LEFT HEART CATH AND CORONARY/GRAFT ANGIOGRAPHY;  Surgeon: Leonie Man, MD;  Location: [facility];  Service: Cardiovascular;  Laterality: N/A;   TEMPORARY PACEMAKER N/A 08/29/2019   Procedure: TEMPORARY PACEMAKER;  Surgeon: Sherren Mocha, MD;  Location: [facility];  Service: Cardiovascular;  Laterality: N/A;   TOTAL HIP ARTHROPLASTY Left        Family History  Problem Relation Age of Onset   Diabetes Father    Alzheimer's disease Father    Heart attack Brother        multiple brothers with MIs   COPD Brother    Hypertension Mother    Stroke Mother    Head & neck cancer Nephew     Social History   Tobacco Use   Smoking status: Never Smoker   Smokeless tobacco: Never Used  Substance Use Topics   Alcohol use: No   Drug use: No    Home Medications Prior to Admission medications   Medication Sig Start Date End Date Taking? Authorizing Provider  amitriptyline (ELAVIL) 50 MG tablet TAKE 1 AND 1/2 TABLETS BY MOUTH AT BEDTIME. Patient taking differently: Take 75 mg by mouth at bedtime.  05/06/19   [name], MD  aspirin EC 81 MG tablet Take 81 mg by mouth daily.    [provider]  atorvastatin (LIPITOR) 80 MG tablet TAKE ONE TABLET BY MOUTH ONCE DAILY. Patient taking differently: Take 80 mg by mouth daily.  06/03/19   [name], MD  blood glucose meter kit and supplies KIT Dispense based on patient and insurance preference. Use up to 3 times daily as directed. DX:E11.9 08/21/19   [name], MD  Cholecalciferol (VITAMIN D3) 25 MCG (1000 UT) CHEW Chew 1,000 Units by mouth daily.     [provider]  clonazePAM (KLONOPIN) 0.25 MG disintegrating tablet DISSOLVE 1 TABLET BY MOUTH DAILY AS NEEDED FOR  ANXIETY. Patient taking differently: Take 0.25 mg by mouth daily as needed (for anxiety- dissolve in the mouth).  07/22/19   [name], MD  docusate sodium (COLACE) 100 MG capsule Take 100 mg by mouth 3 (three) times daily.     [provider]  DULoxetine (CYMBALTA) 30 MG capsule Take 30 mg by mouth at bedtime.  10/16/18 12/02/2019  [provider]  finasteride (PROSCAR) 5 MG tablet Take 5 mg by mouth daily.  04/24/18   [provider]  hydroxychloroquine (PLAQUENIL) 200 MG tablet Take 200 mg by mouth at bedtime.  04/24/18   [provider]  insulin aspart protamine- aspart (NOVOLOG MIX 70/30) (70-30) 100 UNIT/ML injection Inject 12 units into skin in the morning  and 8 units evening Patient taking differently:  Inject 8-12 Units into the skin See admin instructions. Inject 12 units into the skin in the morning and 8 units in the evening 05/06/19   [name], MD  methadone (DOLOPHINE) 10 MG tablet Take 10 mg by mouth every 6 (six) hours.  04/18/18   [provider]  methadone (DOLOPHINE) 5 MG tablet Take 5 mg by mouth 3 (three) times daily as needed (for breakthrough pain).  04/18/18   [provider]  nitroGLYCERIN (NITROSTAT) 0.4 MG SL tablet Place 1 tablet (0.4 mg total) under the tongue every 5 (five) minutes x 3 doses as needed for chest pain. 09/04/19   D[facility], Tami Lin, PA  polyethylene glycol powder (MIRALAX) powder Take 17 g by mouth 2 (two) times daily. Patient taking differently: Take 17 g by mouth at bedtime.  03/01/18   [name], MD  Polyvinyl Alcohol-Povidone (REFRESH OP) Place 1 drop into both eyes 3 (three) times daily as needed (for dry eyes).     [provider]  predniSONE (DELTASONE) 5 MG tablet TAKE 1 TABLET BY MOUTH ONCE A DAY WITH BREAKFAST. Patient taking differently: Take 5 mg by mouth daily with breakfast.  06/03/19   [name], MD  pregabalin (LYRICA) 25 MG capsule TAKE ONE CAPSULE BY MOUTH  AT BEDTIME. Patient taking differently: Take 25 mg by mouth at bedtime.  06/03/19   [name], MD  Respiratory Therapy Supplies (FLUTTER) DEVI 1 each by Does not apply route 2 (two) times daily. 10/31/19   Julian Hy, DO  senna (SENOKOT) 8.6 MG TABS tablet Take 1 tablet by mouth 2 (two) times daily.    [provider]  sodium chloride HYPERTONIC 3 % nebulizer solution Take by nebulization 2 (two) times daily. 10/31/19   Julian Hy, DO  tamsulosin (FLOMAX) 0.4 MG CAPS capsule Take 1 capsule (0.4 mg total) by mouth at bedtime. 05/06/19   [name], MD  torsemide (DEMADEX) 10 MG tablet TAKE 2 TABLETS BY MOUTH EVERY OTHER DAY. Patient taking differently: Take 20 mg by mouth every other day.  05/06/19   [name], MD  voriconazole (VFEND) 200 MG tablet Take 1 tablet (200 mg total) by mouth 2 (two) times daily. 10/31/19   Julian Hy, DO    Allergies    Sulfa antibiotics  Review of Systems   Review of Systems  Unable to perform ROS: Mental status change  Constitutional: Positive for fatigue.  Respiratory: Positive for cough (chronic).   Psychiatric/Behavioral: Negative for confusion.    Physical Exam Updated Vital Signs BP (!) 101/48    Pulse 98    Temp 97.9 F (36.6 C) (Rectal)    Resp 14    SpO2 100%   Physical Exam Vitals and nursing note reviewed.  Constitutional:      General: He is not in acute distress.    Appearance: Normal appearance. He is well-developed. He is ill-appearing.     Comments: Chronically ill appearing. Extremities are cool  HENT:     Head: Normocephalic and atraumatic.  Eyes:     General: No scleral icterus.       Right eye: No discharge.        Left eye: No discharge.     Conjunctiva/sclera: Conjunctivae normal.     Pupils: Pupils are equal, round, and reactive to light.  Cardiovascular:     Rate and Rhythm: Normal rate and regular rhythm.     Heart sounds: Murmur present.  Pulmonary:     Effort: Pulmonary effort is  normal. No respiratory distress.     Breath sounds: Normal breath sounds.  Abdominal:     General: There is no distension.     Palpations: Abdomen is soft.     Tenderness: There is no abdominal tenderness.  Musculoskeletal:     Cervical back: Normal range of motion.  Skin:    General: Skin is warm and dry.  Neurological:     General: No focal deficit present.     Mental Status: He is lethargic, disoriented and confused.     GCS: GCS eye subscore is 4. GCS verbal subscore is 2. GCS motor subscore is 6.     Comments: Unable to fully assess. He will lift both arms off the stretcher for a limited time  Psychiatric:        Behavior: Behavior normal.     ED Results / Procedures / Treatments   Labs (all labs ordered are listed, but only abnormal results are displayed) Labs Reviewed  COMPREHENSIVE METABOLIC PANEL - Abnormal; Notable for the following components:      Result Value   Sodium 129 (*)    Potassium 5.4 (*)    Chloride 94 (*)    CO2 21 (*)    Glucose, Bld 315 (*)    BUN 99 (*)    Creatinine, Ser 4.04 (*)    Calcium 8.1 (*)    Total Protein 6.4 (*)    Albumin 3.0 (*)    AST 548 (*)    ALT 507 (*)    Alkaline Phosphatase 145 (*)    Total Bilirubin 2.7 (*)    GFR calc non Af Amer 14 (*)    GFR calc Af Amer 16 (*)    All other components within normal limits  CBC WITH DIFFERENTIAL/PLATELET - Abnormal; Notable for the following components:   RBC 3.01 (*)    Hemoglobin 9.1 (*)    HCT 29.8 (*)    RDW 18.1 (*)    Platelets 108 (*)    nRBC 0.3 (*)    Lymphs Abs 0.3 (*)    All other components within normal limits  URINALYSIS, ROUTINE W REFLEX MICROSCOPIC - Abnormal; Notable for the following components:   Color, Urine AMBER (*)    APPearance HAZY (*)    Protein, ur 30 (*)    Bacteria, UA RARE (*)    All other components within normal limits  LACTIC ACID, PLASMA - Abnormal; Notable for the following components:   Lactic Acid, Venous 2.7 (*)    All other components  within normal limits  LACTIC ACID, PLASMA - Abnormal; Notable for the following components:   Lactic Acid, Venous 2.3 (*)    All other components within normal limits  CULTURE, BLOOD (ROUTINE X 2)  CULTURE, BLOOD (ROUTINE X 2)  SARS CORONAVIRUS 2 (TAT 6-24 HRS)  URINE CULTURE  AMMONIA  LACTIC ACID, PLASMA    EKG None  Radiology CT ABDOMEN PELVIS WO CONTRAST  Result Date: 12/09/2019 CLINICAL DATA:  Nausea and vomiting. EXAM: CT ABDOMEN AND PELVIS WITHOUT CONTRAST TECHNIQUE: Multidetector CT imaging of the abdomen and pelvis was performed following the standard protocol without IV contrast. COMPARISON:  Abdominal CTA 10/28/2019 FINDINGS: Lower chest: Multi chamber cardiomegaly. Dense coronary artery calcifications. Small pleural effusions with irregular consolidative and ground-glass opacities in both lower lobes. Hepatobiliary: Small volume of perihepatic ascites is new from prior CT. No gross focal hepatic lesion allowing for lack contrast and  streak artifact from arms down positioning. Layering hyperdensity in the gallbladder likely combination of stones and sludge. Motion limits detailed assessment for pericholecystic stranding. No biliary dilatation. Pancreas: Diffuse fatty atrophy. No ductal dilatation or inflammation. Spleen: Normal in size without focal abnormality. Adrenals/Urinary Tract: Normal adrenal glands. No hydronephrosis. Mild thinning of bilateral renal parenchyma with lobulated renal contours. 15 mm lesion from the interpolar left kidney unchanged from prior exam. Advanced renal vascular calcifications. Urinary bladder is partially distended. Left aspect of the bladder is obscured by streak artifact from left hip arthroplasty. Stomach/Bowel: Air-fluid level in the stomach which is mildly distended. No gastric or duodenal wall thickening. No small bowel dilatation or obstruction. Appendix is tentatively visualized and normal. No evidence of appendicitis. Large stool burden in the  ascending transverse, and proximal descending colon. More distal descending colon is air-filled distal colon is decompressed. There is diffuse colonic tortuosity. No evidence of colonic wall thickening or inflammation. Vascular/Lymphatic: Advanced vascular disease. Advanced aortic and branch atherosclerosis. No aortic aneurysm. No bulky abdominopelvic adenopathy. Reproductive: No acute findings. Borderline enlarged prostate gland. Other: Small volume of ascites in the pelvis, both pericolic gutters, and right upper quadrant. No free air. No loculated intra-abdominal fluid collection. Mild body wall edema, confluent in the flanks. Small fat containing umbilical hernia. Fat in the left greater than right inguinal canal. Musculoskeletal: Multilevel degenerative change in the spine. Similar appearance of degenerative disc disease at L3-L4. There are no acute or suspicious osseous abnormalities. Left hip arthroplasty. IMPRESSION: 1. Mild gastric distension with air-fluid level, can be seen with gastroenteritis or gastroparesis. No gastric wall thickening or evidence of gastric outlet obstruction. 2. Small volume of ascites in the abdomen and pelvis, new from prior CT. Mild body wall edema. 3. Large stool burden with colonic tortuosity, suggesting constipation. No bowel obstruction. 4. Bilateral pleural effusions. Irregular streaky consolidative airspace opacities in both lower lobes, increased from CT last month. Pneumonia or aspiration considered. 5. Severe aortic and branch atherosclerosis. Aortic Atherosclerosis (ICD10-I70.0). Electronically Signed   By: Keith Rake M.D.   On: 12/16/2019 23:58   CT Head Wo Contrast  Result Date: 12/24/2019 CLINICAL DATA:  Encephalopathy EXAM: CT HEAD WITHOUT CONTRAST TECHNIQUE: Contiguous axial images were obtained from the base of the skull through the vertex without intravenous contrast. COMPARISON:  None. FINDINGS: Brain: There is no mass, hemorrhage or extra-axial  collection. The size and configuration of the ventricles and extra-axial CSF spaces are normal. There is hypoattenuation of the white matter, most commonly indicating chronic small vessel disease. Vascular: Atherosclerotic calcification of the vertebral and internal carotid arteries at the skull base. No abnormal hyperdensity of the major intracranial arteries or dural venous sinuses. Skull: The visualized skull base, calvarium and extracranial soft tissues are normal. Sinuses/Orbits: No fluid levels or advanced mucosal thickening of the visualized paranasal sinuses. No mastoid or middle ear effusion. The orbits are normal. IMPRESSION: Findings of chronic small vessel disease without acute intracranial abnormality. Electronically Signed   By: Ulyses Jarred M.D.   On: 12/18/2019 19:28   DG Chest Port 1 View  Result Date: 12/25/2019 CLINICAL DATA:  Chronic cough and shortness of breath EXAM: PORTABLE CHEST 1 VIEW COMPARISON:  September 11, 2019 FINDINGS: Patchy opacities are identified in the left suprahilar region, right lung base. Elevated right hemidiaphragm is identified, chronic. There is no pleural effusion. Cardiac pacemaker is unchanged. The heart size is enlarged. IMPRESSION: Patchy opacity identified in the left perihilar region and right lung base, pneumonia not excluded.  Electronically Signed   By: Abelardo Diesel M.D.   On: 12/02/2019 19:58    Procedures Procedures (including critical care time)  CRITICAL CARE Performed by: Recardo Evangelist   Total critical care time: 35 minutes  Critical care time was exclusive of separately billable procedures and treating other patients.  Critical care was necessary to treat or prevent imminent or life-threatening deterioration.  Critical care was time spent personally by me on the following activities: development of treatment plan with patient and/or surrogate as well as nursing, discussions with consultants, evaluation of patient's response to  treatment, examination of patient, obtaining history from patient or surrogate, ordering and performing treatments and interventions, ordering and review of laboratory studies, ordering and review of radiographic studies, pulse oximetry and re-evaluation of patient's condition.   Medications Ordered in ED Medications  0.9 %  sodium chloride infusion (has no administration in time range)  cefTRIAXone (ROCEPHIN) 1 g in sodium chloride 0.9 % 100 mL IVPB (1 g Intravenous New Bag/Given 12/07/2019 2358)  doxycycline (VIBRAMYCIN) 100 mg in sodium chloride 0.9 % 250 mL IVPB (has no administration in time range)  sodium chloride 0.9 % bolus 500 mL (0 mLs Intravenous Stopped 11/26/2019 2029)  insulin aspart (novoLOG) injection 5 Units (5 Units Intravenous Given 12/11/2019 2104)  sodium chloride 0.9 % bolus 500 mL (500 mLs Intravenous New Bag/Given 12/19/2019 2358)    ED Course  I have reviewed the triage vital signs and the nursing notes.  Pertinent labs & imaging results that were available during my care of the patient were reviewed by me and considered in my medical decision making (see chart for details).    MDM Rules/Calculators/A&P  68 year old male presents with fatigue and altered mental status for the past 2 to 3 days.  His blood pressures are soft and heart rate is slightly elevated.  Rectal temp was obtained and is 97.9.  He is not hypoxic but sats were in the low 90s and he was placed on 2 L via nasal cannula.  Will obtain labs, chest x-ray, CT head.  We will give another fluid bolus as patient appears dry  EKG is sinus rhythm with PVCs, prolonged PR interval and left bundle branch block.  Chest x-ray shows patchy opacity in the left perihilar region and right lung base.  CT head is negative.  CBC is remarkable for low hemoglobin (9.1).  White count is normal.  CMP is remarkable for multiple derangements.  Most notably he has a significant AKI with creatinine at 4 today.  Potassium is slightly  elevated at 5.4. he also has elevated transaminases and bilirubin.  Lactic acid is slightly elevated at 2.7.   Shared visit with Dr. Rogene Houston. Due to elevated LFTs and since we do not have [place] at this time a CT w/o contrast was ordered of the abdomen/pelvis. Will admit for further management for AKI. Discussed with Dr. Denton Brick who will admit.  Final Clinical Impression(s) / ED Diagnoses Final diagnoses:  AKI (acute kidney injury) ([place])  Altered mental status, unspecified altered mental status type  Elevated transaminase level  Multifocal pneumonia    Rx / DC Orders ED Discharge Orders    None       Recardo Evangelist, PA-C 12/06/19 0119    Fredia Sorrow, MD 12/09/19 2348

## 2019-12-05 NOTE — ED Notes (Signed)
[employer] Interrogator Device undergoing Software Update and approval from [employer]. Actuary. MD Notified, and [employer] Interrogator possibly will work next week.

## 2019-12-05 NOTE — ED Notes (Signed)
Patient transported to CT 

## 2019-12-06 ENCOUNTER — Inpatient Hospital Stay (HOSPITAL_COMMUNITY): Payer: Medicare HMO | Admitting: Anesthesiology

## 2019-12-06 ENCOUNTER — Inpatient Hospital Stay (HOSPITAL_COMMUNITY): Payer: Medicare HMO

## 2019-12-06 ENCOUNTER — Observation Stay (HOSPITAL_COMMUNITY): Payer: Medicare HMO

## 2019-12-06 DIAGNOSIS — I2510 Atherosclerotic heart disease of native coronary artery without angina pectoris: Secondary | ICD-10-CM | POA: Diagnosis not present

## 2019-12-06 DIAGNOSIS — A4152 Sepsis due to Pseudomonas: Secondary | ICD-10-CM | POA: Diagnosis present

## 2019-12-06 DIAGNOSIS — I351 Nonrheumatic aortic (valve) insufficiency: Secondary | ICD-10-CM | POA: Diagnosis not present

## 2019-12-06 DIAGNOSIS — R7401 Elevation of levels of liver transaminase levels: Secondary | ICD-10-CM

## 2019-12-06 DIAGNOSIS — I509 Heart failure, unspecified: Secondary | ICD-10-CM | POA: Diagnosis not present

## 2019-12-06 DIAGNOSIS — I447 Left bundle-branch block, unspecified: Secondary | ICD-10-CM | POA: Diagnosis not present

## 2019-12-06 DIAGNOSIS — I130 Hypertensive heart and chronic kidney disease with heart failure and stage 1 through stage 4 chronic kidney disease, or unspecified chronic kidney disease: Secondary | ICD-10-CM | POA: Diagnosis present

## 2019-12-06 DIAGNOSIS — Z7189 Other specified counseling: Secondary | ICD-10-CM | POA: Diagnosis not present

## 2019-12-06 DIAGNOSIS — N184 Chronic kidney disease, stage 4 (severe): Secondary | ICD-10-CM | POA: Diagnosis present

## 2019-12-06 DIAGNOSIS — R570 Cardiogenic shock: Secondary | ICD-10-CM | POA: Diagnosis not present

## 2019-12-06 DIAGNOSIS — Z515 Encounter for palliative care: Secondary | ICD-10-CM

## 2019-12-06 DIAGNOSIS — I350 Nonrheumatic aortic (valve) stenosis: Secondary | ICD-10-CM | POA: Diagnosis not present

## 2019-12-06 DIAGNOSIS — M05711 Rheumatoid arthritis with rheumatoid factor of right shoulder without organ or systems involvement: Secondary | ICD-10-CM | POA: Diagnosis not present

## 2019-12-06 DIAGNOSIS — J15212 Pneumonia due to Methicillin resistant Staphylococcus aureus: Secondary | ICD-10-CM | POA: Diagnosis present

## 2019-12-06 DIAGNOSIS — R188 Other ascites: Secondary | ICD-10-CM | POA: Diagnosis present

## 2019-12-06 DIAGNOSIS — J9601 Acute respiratory failure with hypoxia: Secondary | ICD-10-CM | POA: Diagnosis present

## 2019-12-06 DIAGNOSIS — D689 Coagulation defect, unspecified: Secondary | ICD-10-CM | POA: Diagnosis present

## 2019-12-06 DIAGNOSIS — R0902 Hypoxemia: Secondary | ICD-10-CM | POA: Diagnosis not present

## 2019-12-06 DIAGNOSIS — N170 Acute kidney failure with tubular necrosis: Secondary | ICD-10-CM | POA: Diagnosis present

## 2019-12-06 DIAGNOSIS — E43 Unspecified severe protein-calorie malnutrition: Secondary | ICD-10-CM | POA: Diagnosis present

## 2019-12-06 DIAGNOSIS — I442 Atrioventricular block, complete: Secondary | ICD-10-CM | POA: Diagnosis present

## 2019-12-06 DIAGNOSIS — N179 Acute kidney failure, unspecified: Secondary | ICD-10-CM | POA: Diagnosis present

## 2019-12-06 DIAGNOSIS — G9341 Metabolic encephalopathy: Secondary | ICD-10-CM | POA: Diagnosis present

## 2019-12-06 DIAGNOSIS — J189 Pneumonia, unspecified organism: Secondary | ICD-10-CM

## 2019-12-06 DIAGNOSIS — I5032 Chronic diastolic (congestive) heart failure: Secondary | ICD-10-CM | POA: Diagnosis not present

## 2019-12-06 DIAGNOSIS — M05712 Rheumatoid arthritis with rheumatoid factor of left shoulder without organ or systems involvement: Secondary | ICD-10-CM | POA: Diagnosis not present

## 2019-12-06 DIAGNOSIS — R64 Cachexia: Secondary | ICD-10-CM | POA: Diagnosis present

## 2019-12-06 DIAGNOSIS — Z66 Do not resuscitate: Secondary | ICD-10-CM | POA: Diagnosis not present

## 2019-12-06 DIAGNOSIS — A419 Sepsis, unspecified organism: Secondary | ICD-10-CM

## 2019-12-06 DIAGNOSIS — J9600 Acute respiratory failure, unspecified whether with hypoxia or hypercapnia: Secondary | ICD-10-CM | POA: Diagnosis not present

## 2019-12-06 DIAGNOSIS — Z452 Encounter for adjustment and management of vascular access device: Secondary | ICD-10-CM

## 2019-12-06 DIAGNOSIS — J9811 Atelectasis: Secondary | ICD-10-CM | POA: Diagnosis not present

## 2019-12-06 DIAGNOSIS — E785 Hyperlipidemia, unspecified: Secondary | ICD-10-CM | POA: Diagnosis not present

## 2019-12-06 DIAGNOSIS — I953 Hypotension of hemodialysis: Secondary | ICD-10-CM | POA: Diagnosis not present

## 2019-12-06 DIAGNOSIS — Y848 Other medical procedures as the cause of abnormal reaction of the patient, or of later complication, without mention of misadventure at the time of the procedure: Secondary | ICD-10-CM | POA: Diagnosis not present

## 2019-12-06 DIAGNOSIS — R4182 Altered mental status, unspecified: Secondary | ICD-10-CM | POA: Diagnosis not present

## 2019-12-06 DIAGNOSIS — R6521 Severe sepsis with septic shock: Secondary | ICD-10-CM | POA: Diagnosis present

## 2019-12-06 DIAGNOSIS — J470 Bronchiectasis with acute lower respiratory infection: Secondary | ICD-10-CM | POA: Diagnosis present

## 2019-12-06 DIAGNOSIS — D849 Immunodeficiency, unspecified: Secondary | ICD-10-CM | POA: Diagnosis present

## 2019-12-06 DIAGNOSIS — Z20828 Contact with and (suspected) exposure to other viral communicable diseases: Secondary | ICD-10-CM | POA: Diagnosis present

## 2019-12-06 DIAGNOSIS — R579 Shock, unspecified: Secondary | ICD-10-CM | POA: Diagnosis not present

## 2019-12-06 DIAGNOSIS — J95851 Ventilator associated pneumonia: Secondary | ICD-10-CM | POA: Diagnosis not present

## 2019-12-06 DIAGNOSIS — E871 Hypo-osmolality and hyponatremia: Secondary | ICD-10-CM | POA: Diagnosis not present

## 2019-12-06 DIAGNOSIS — J151 Pneumonia due to Pseudomonas: Secondary | ICD-10-CM | POA: Diagnosis present

## 2019-12-06 DIAGNOSIS — A4102 Sepsis due to Methicillin resistant Staphylococcus aureus: Secondary | ICD-10-CM | POA: Diagnosis present

## 2019-12-06 DIAGNOSIS — I5043 Acute on chronic combined systolic (congestive) and diastolic (congestive) heart failure: Secondary | ICD-10-CM | POA: Diagnosis present

## 2019-12-06 DIAGNOSIS — K7200 Acute and subacute hepatic failure without coma: Secondary | ICD-10-CM | POA: Diagnosis present

## 2019-12-06 LAB — GLUCOSE, CAPILLARY
Glucose-Capillary: 211 mg/dL — ABNORMAL HIGH (ref 70–99)
Glucose-Capillary: 207 mg/dL — ABNORMAL HIGH (ref 70–99)
Glucose-Capillary: 180 mg/dL — ABNORMAL HIGH (ref 70–99)
Glucose-Capillary: 225 mg/dL — ABNORMAL HIGH (ref 70–99)
Glucose-Capillary: 271 mg/dL — ABNORMAL HIGH (ref 70–99)

## 2019-12-06 LAB — COMPREHENSIVE METABOLIC PANEL
ALT: 447 U/L — ABNORMAL HIGH (ref 0–44)
AST: 411 U/L — ABNORMAL HIGH (ref 15–41)
Albumin: 3 g/dL — ABNORMAL LOW (ref 3.5–5.0)
Alkaline Phosphatase: 149 U/L — ABNORMAL HIGH (ref 38–126)
Anion gap: 16 — ABNORMAL HIGH (ref 5–15)
BUN: 97 mg/dL — ABNORMAL HIGH (ref 8–23)
CO2: 18 mmol/L — ABNORMAL LOW (ref 22–32)
Calcium: 7.8 mg/dL — ABNORMAL LOW (ref 8.9–10.3)
Chloride: 97 mmol/L — ABNORMAL LOW (ref 98–111)
Creatinine, Ser: 4.12 mg/dL — ABNORMAL HIGH (ref 0.61–1.24)
GFR calc Af Amer: 16 mL/min — ABNORMAL LOW (ref >60)
GFR calc non Af Amer: 14 mL/min — ABNORMAL LOW (ref >60)
Glucose, Bld: 198 mg/dL — ABNORMAL HIGH (ref 70–99)
Potassium: 5.6 mmol/L — ABNORMAL HIGH (ref 3.5–5.1)
Sodium: 131 mmol/L — ABNORMAL LOW (ref 135–145)
Total Bilirubin: 2.5 mg/dL — ABNORMAL HIGH (ref 0.3–1.2)
Total Protein: 6.2 g/dL — ABNORMAL LOW (ref 6.5–8.1)

## 2019-12-06 LAB — LACTIC ACID, PLASMA
Lactic Acid, Venous: 2.8 mmol/L (ref 0.5–1.9)
Lactic Acid, Venous: 3 mmol/L (ref 0.5–1.9)
Lactic Acid, Venous: 4.5 mmol/L (ref 0.5–1.9)
Lactic Acid, Venous: 3.8 mmol/L (ref 0.5–1.9)

## 2019-12-06 LAB — SARS CORONAVIRUS 2 (TAT 6-24 HRS): SARS Coronavirus 2: NEGATIVE

## 2019-12-06 LAB — HEPATIC FUNCTION PANEL
ALT: 466 U/L — ABNORMAL HIGH (ref 0–44)
AST: 475 U/L — ABNORMAL HIGH (ref 15–41)
Albumin: 3 g/dL — ABNORMAL LOW (ref 3.5–5.0)
Alkaline Phosphatase: 150 U/L — ABNORMAL HIGH (ref 38–126)
Bilirubin, Direct: 1.7 mg/dL — ABNORMAL HIGH (ref 0.0–0.2)
Indirect Bilirubin: 1 mg/dL — ABNORMAL HIGH (ref 0.3–0.9)
Total Bilirubin: 2.7 mg/dL — ABNORMAL HIGH (ref 0.3–1.2)
Total Protein: 6.3 g/dL — ABNORMAL LOW (ref 6.5–8.1)

## 2019-12-06 LAB — BLOOD GAS, ARTERIAL
Acid-base deficit: 7.3 mmol/L — ABNORMAL HIGH (ref 0.0–2.0)
Acid-base deficit: 3.6 mmol/L — ABNORMAL HIGH (ref 0.0–2.0)
Bicarbonate: 19.1 mmol/L — ABNORMAL LOW (ref 20.0–28.0)
Drawn by: 41977
FIO2: 52
FIO2: 100
MECHVT: 540 mL
O2 Saturation: 99.9 %
O2 Saturation: >100 %
PEEP: 5 cmH2O
Patient temperature: 37
Patient temperature: 37
RATE: 18 resp/min
pCO2 arterial: 24.4 mmHg — ABNORMAL LOW (ref 32.0–48.0)
pCO2 arterial: 34.2 mmHg (ref 32.0–48.0)
pH, Arterial: 7.436 (ref 7.350–7.450)
pH, Arterial: 7.394 (ref 7.350–7.450)
pO2, Arterial: 167 mmHg — ABNORMAL HIGH (ref 83.0–108.0)
pO2, Arterial: 365 mmHg — ABNORMAL HIGH (ref 83.0–108.0)

## 2019-12-06 LAB — CBC
HCT: 30.2 % — ABNORMAL LOW (ref 39.0–52.0)
Hemoglobin: 9.2 g/dL — ABNORMAL LOW (ref 13.0–17.0)
MCH: 29.8 pg (ref 26.0–34.0)
MCHC: 30.5 g/dL (ref 30.0–36.0)
MCV: 97.7 fL (ref 80.0–100.0)
Platelets: 121 10*3/uL — ABNORMAL LOW (ref 150–400)
RBC: 3.09 MIL/uL — ABNORMAL LOW (ref 4.22–5.81)
RDW: 18.6 % — ABNORMAL HIGH (ref 11.5–15.5)
WBC: 11.2 10*3/uL — ABNORMAL HIGH (ref 4.0–10.5)
nRBC: 0.2 % (ref 0.0–0.2)

## 2019-12-06 LAB — BASIC METABOLIC PANEL
Anion gap: 17 — ABNORMAL HIGH (ref 5–15)
BUN: 99 mg/dL — ABNORMAL HIGH (ref 8–23)
CO2: 20 mmol/L — ABNORMAL LOW (ref 22–32)
Calcium: 8.2 mg/dL — ABNORMAL LOW (ref 8.9–10.3)
Chloride: 96 mmol/L — ABNORMAL LOW (ref 98–111)
Creatinine, Ser: 4.1 mg/dL — ABNORMAL HIGH (ref 0.61–1.24)
GFR calc Af Amer: 16 mL/min — ABNORMAL LOW (ref >60)
GFR calc non Af Amer: 14 mL/min — ABNORMAL LOW (ref >60)
Glucose, Bld: 211 mg/dL — ABNORMAL HIGH (ref 70–99)
Potassium: 5.4 mmol/L — ABNORMAL HIGH (ref 3.5–5.1)
Sodium: 133 mmol/L — ABNORMAL LOW (ref 135–145)

## 2019-12-06 LAB — RESPIRATORY PANEL BY PCR

## 2019-12-06 LAB — HEPATITIS PANEL, ACUTE
HCV Ab: NONREACTIVE
Hep A IgM: NONREACTIVE
Hep B C IgM: NONREACTIVE
Hepatitis B Surface Ag: NONREACTIVE

## 2019-12-06 LAB — PROTIME-INR
INR: 2.8 — ABNORMAL HIGH (ref 0.8–1.2)
INR: 3.1 — ABNORMAL HIGH (ref 0.8–1.2)
Prothrombin Time: 29.8 seconds — ABNORMAL HIGH (ref 11.4–15.2)
Prothrombin Time: 31.7 seconds — ABNORMAL HIGH (ref 11.4–15.2)

## 2019-12-06 LAB — HEMOGLOBIN A1C
Hgb A1c MFr Bld: 6.5 % — ABNORMAL HIGH (ref 4.8–5.6)
Mean Plasma Glucose: 139.85 mg/dL

## 2019-12-06 LAB — POTASSIUM: Potassium: 4.8 mmol/L (ref 3.5–5.1)

## 2019-12-06 LAB — CBG MONITORING, ED: Glucose-Capillary: 231 mg/dL — ABNORMAL HIGH (ref 70–99)

## 2019-12-06 LAB — CREATININE, URINE, RANDOM
Creatinine, Urine: 167.32 mg/dL
Creatinine, Urine: 144.47 mg/dL

## 2019-12-06 LAB — MRSA PCR SCREENING: MRSA by PCR: NEGATIVE

## 2019-12-06 LAB — SODIUM, URINE, RANDOM: Sodium, Ur: <10 mmol/L

## 2019-12-06 MED ORDER — SODIUM CHLORIDE 0.9 % IV SOLN
200.0000 mg | Freq: Once | INTRAVENOUS | Status: AC
  Administered 2019-12-07: 200 mg via INTRAVENOUS
  Filled 2019-12-06 (×4): qty 200

## 2019-12-06 MED ORDER — CHLORHEXIDINE GLUCONATE CLOTH 2 % EX PADS
6.0000 | MEDICATED_PAD | Freq: Every day | CUTANEOUS | Status: DC
  Administered 2019-12-06 – 2019-12-07 (×2): 6 via TOPICAL

## 2019-12-06 MED ORDER — SODIUM CHLORIDE 0.9% FLUSH: 10.0000 mL | INTRAVENOUS | Status: DC | PRN

## 2019-12-06 MED ORDER — NALOXONE HCL 0.4 MG/ML IJ SOLN
0.4000 mg | INTRAMUSCULAR | Status: DC | PRN
  Administered 2019-12-06: 0.4 mg via INTRAVENOUS
  Filled 2019-12-06 (×2): qty 1

## 2019-12-06 MED ORDER — BISACODYL 10 MG RE SUPP
10.0000 mg | Freq: Four times a day (QID) | RECTAL | Status: AC
  Administered 2019-12-06: 09:00:00 10 mg via RECTAL
  Filled 2019-12-06: qty 1

## 2019-12-06 MED ORDER — INSULIN ASPART 100 UNIT/ML ~~LOC~~ SOLN
0.0000 [IU] | SUBCUTANEOUS | Status: DC
  Administered 2019-12-06 (×2): 5 [IU] via SUBCUTANEOUS
  Administered 2019-12-06: 3 [IU] via SUBCUTANEOUS
  Administered 2019-12-06 – 2019-12-07 (×3): 5 [IU] via SUBCUTANEOUS
  Administered 2019-12-07 – 2019-12-08 (×3): 2 [IU] via SUBCUTANEOUS
  Administered 2019-12-08 (×2): 3 [IU] via SUBCUTANEOUS
  Administered 2019-12-08: 5 [IU] via SUBCUTANEOUS
  Administered 2019-12-09 (×3): 3 [IU] via SUBCUTANEOUS
  Administered 2019-12-10: 2 [IU] via SUBCUTANEOUS
  Filled 2019-12-06: qty 1

## 2019-12-06 MED ORDER — SODIUM CHLORIDE 0.9% FLUSH
10.0000 mL | Freq: Two times a day (BID) | INTRAVENOUS | Status: DC
  Administered 2019-12-07 – 2019-12-09 (×5): 10 mL
  Administered 2019-12-09: 10:00:00 30 mL
  Administered 2019-12-10: 10 mL
  Administered 2019-12-10: 20 mL
  Administered 2019-12-11 – 2019-12-12 (×2): 10 mL

## 2019-12-06 MED ORDER — VORICONAZOLE 200 MG PO TABS: 200.0000 mg | ORAL_TABLET | Freq: Two times a day (BID) | ORAL | Status: DC

## 2019-12-06 MED ORDER — ROCURONIUM BROMIDE 100 MG/10ML IV SOLN
INTRAVENOUS | Status: DC | PRN
  Administered 2019-12-06: 50 mg via INTRAVENOUS

## 2019-12-06 MED ORDER — DEXTROSE 50 % IV SOLN
1.0000 | Freq: Once | INTRAVENOUS | Status: AC
  Administered 2019-12-06: 50 mL via INTRAVENOUS
  Filled 2019-12-06: qty 50

## 2019-12-06 MED ORDER — INSULIN ASPART 100 UNIT/ML IV SOLN
10.0000 [IU] | Freq: Once | INTRAVENOUS | Status: AC
  Administered 2019-12-06: 10 [IU] via INTRAVENOUS

## 2019-12-06 MED ORDER — SODIUM ZIRCONIUM CYCLOSILICATE 10 G PO PACK
10.0000 g | PACK | Freq: Three times a day (TID) | ORAL | Status: DC
  Filled 2019-12-06: qty 1

## 2019-12-06 MED ORDER — SODIUM CHLORIDE 0.9 % IV SOLN: INTRAVENOUS | Status: DC

## 2019-12-06 MED ORDER — HEPARIN SODIUM (PORCINE) 5000 UNIT/ML IJ SOLN
5000.0000 [IU] | Freq: Three times a day (TID) | INTRAMUSCULAR | Status: DC
  Administered 2019-12-06: 5000 [IU] via SUBCUTANEOUS
  Filled 2019-12-06: qty 1

## 2019-12-06 MED ORDER — HYDROCORTISONE NA SUCCINATE PF 100 MG IJ SOLR
50.0000 mg | Freq: Four times a day (QID) | INTRAMUSCULAR | Status: DC
  Administered 2019-12-06 (×2): 50 mg via INTRAVENOUS
  Filled 2019-12-06 (×2): qty 2

## 2019-12-06 MED ORDER — SODIUM CHLORIDE 0.9% FLUSH
10.0000 mL | Freq: Two times a day (BID) | INTRAVENOUS | Status: DC
  Administered 2019-12-07 – 2019-12-09 (×5): 10 mL
  Administered 2019-12-10: 20 mL
  Administered 2019-12-10 – 2019-12-15 (×5): 10 mL
  Administered 2019-12-16: 40 mL
  Administered 2019-12-16 – 2019-12-17 (×2): 10 mL

## 2019-12-06 MED ORDER — ACETAMINOPHEN 325 MG PO TABS: 650.0000 mg | ORAL_TABLET | Freq: Four times a day (QID) | ORAL | Status: DC | PRN

## 2019-12-06 MED ORDER — ACETAMINOPHEN 650 MG RE SUPP: 650.0000 mg | Freq: Four times a day (QID) | RECTAL | Status: DC | PRN

## 2019-12-06 MED ORDER — ETOMIDATE 2 MG/ML IV SOLN
INTRAVENOUS | Status: DC | PRN
  Administered 2019-12-06: 10 mg via INTRAVENOUS

## 2019-12-06 MED ORDER — FENTANYL CITRATE (PF) 100 MCG/2ML IJ SOLN
25.0000 ug | INTRAMUSCULAR | Status: DC | PRN
  Administered 2019-12-06: 50 ug via INTRAVENOUS
  Administered 2019-12-08: 25 ug via INTRAVENOUS
  Filled 2019-12-06 (×3): qty 2

## 2019-12-06 MED ORDER — PHENYLEPHRINE HCL-NACL 10-0.9 MG/250ML-% IV SOLN
0.0000 ug/min | INTRAVENOUS | Status: DC
  Administered 2019-12-06: 20 ug/min via INTRAVENOUS
  Administered 2019-12-06: 60 ug/min via INTRAVENOUS
  Filled 2019-12-06: qty 250

## 2019-12-06 MED ORDER — SODIUM BICARBONATE-DEXTROSE 150-5 MEQ/L-% IV SOLN
150.0000 meq | INTRAVENOUS | Status: AC
  Administered 2019-12-06 – 2019-12-07 (×2): 150 meq via INTRAVENOUS
  Filled 2019-12-06 (×3): qty 1000

## 2019-12-06 MED ORDER — MIDAZOLAM HCL 2 MG/2ML IJ SOLN
1.0000 mg | INTRAMUSCULAR | Status: DC | PRN
  Filled 2019-12-06: qty 2

## 2019-12-06 MED ORDER — POLYETHYLENE GLYCOL 3350 17 G PO PACK: 17.0000 g | PACK | Freq: Every day | ORAL | Status: DC | PRN

## 2019-12-06 MED ORDER — LACTATED RINGERS IV SOLN
INTRAVENOUS | Status: DC
  Administered 2019-12-06 (×2): via INTRAVENOUS

## 2019-12-06 MED ORDER — NOREPINEPHRINE 4 MG/250ML-% IV SOLN
0.0000 ug/min | INTRAVENOUS | Status: DC
  Administered 2019-12-06: 18:00:00 5 ug/min via INTRAVENOUS
  Administered 2019-12-07: 4 ug/min via INTRAVENOUS
  Filled 2019-12-06 (×2): qty 250

## 2019-12-06 MED ORDER — MIDAZOLAM HCL 2 MG/2ML IJ SOLN
1.0000 mg | INTRAMUSCULAR | Status: DC | PRN
  Administered 2019-12-06 (×2): 1 mg via INTRAVENOUS
  Filled 2019-12-06 (×3): qty 2

## 2019-12-06 MED ORDER — VITAMIN K1 10 MG/ML IJ SOLN
5.0000 mg | Freq: Once | INTRAVENOUS | Status: AC
  Administered 2019-12-06: 5 mg via INTRAVENOUS
  Filled 2019-12-06: qty 0.5

## 2019-12-06 MED ORDER — MIDAZOLAM HCL (PF) 5 MG/ML IJ SOLN
INTRAMUSCULAR | Status: DC | PRN
  Administered 2019-12-06: 2 mg via INTRAVENOUS

## 2019-12-06 MED ORDER — MIDAZOLAM HCL (PF) 10 MG/2ML IJ SOLN
INTRAMUSCULAR | Status: AC
  Administered 2019-12-06: 2 mg
  Filled 2019-12-06: qty 2

## 2019-12-06 MED ORDER — SODIUM CHLORIDE 0.9 % IV SOLN
INTRAVENOUS | Status: DC | PRN
  Administered 2019-12-11: 250 mL via INTRAVENOUS

## 2019-12-06 MED ORDER — FENTANYL CITRATE (PF) 100 MCG/2ML IJ SOLN
INTRAMUSCULAR | Status: AC
  Filled 2019-12-06: qty 2

## 2019-12-06 MED ORDER — FENTANYL CITRATE (PF) 100 MCG/2ML IJ SOLN: 25.0000 ug | INTRAMUSCULAR | Status: DC | PRN

## 2019-12-06 NOTE — Progress Notes (Signed)
2100 Eraxis moved to 2200 as medication is not currently available and [facility] here for patient to be transferred. Patient restless, Versed given as ordered. 5 Units of Novolog administered for Glucose of 225. Solucortef administered via femoral line. Patient wife and family leaving as Patient is being transferred via [facility]. Rodena Goldmann RT as obtained respiratory sample. Patient transferred on ventilator and appropriate IV medications infusing. Belongings and information of accepting room and unit given to family. Patient stable at this time and shows no s/s of distress currently.

## 2019-12-06 NOTE — Progress Notes (Signed)
Inpatient Diabetes Program Recommendations  AACE/ADA: New Consensus Statement on Inpatient Glycemic Control (2015)  Target Ranges:  Prepandial:   less than 140 mg/dL      Peak postprandial:   less than 180 mg/dL (1-2 hours)      Critically ill patients:  140 - 180 mg/dL   Results for KASHIUS, DOMINIC (MRN [id number]) as of 12/06/2019 14:07  Ref. Range 12/06/2019 05:51 12/06/2019 08:15 12/06/2019 11:35  Glucose-Capillary Latest Ref Range: 70 - 99 mg/dL 231 (H)  5 units NOVOLOG  211 (H)  5 units NOVOLOG  207 (H)  5 units NOVOLOG      Home DM Meds: 70/30 Insulin- 12 units AM/ 8 units PM    Current Orders: Novolog Moderate Correction Scale/ SSI (0-15 units) Q4 hours     Getting Solucortef 50 mg Q6 hours.  Getting ready to receive 10 units Novolog + 51ml D50% for elevated K+ level this afternoon.     MD- Please consider adding some basal insulin to pt's inpatient insulin regimen.  Normally gets about 14 units longer-acting insulin in his two doses of 70/30 insulin at home.  Please consider adding Levemir 10 units Daily (0.15 units/kg)     --Will follow patient during hospitalization--  Wyn Quaker RN, MSN, CDE Diabetes Coordinator Inpatient Glycemic Control Team Team Pager: [phone] (8a-5p)

## 2019-12-06 NOTE — Progress Notes (Signed)
CRITICAL VALUE ALERT  Critical Value:  Lactic 3.8  Date & Time Notied:  12/06/2019 1725  Provider Notified: Dr. Waldron Labs  Orders Received/Actions taken: none

## 2019-12-06 NOTE — ED Notes (Signed)
Pt family called for nurse, pt had vomited medium amount onto bed and himself.

## 2019-12-06 NOTE — Op Note (Signed)
Patient:  [name]  DOB:  23-Mar-1951  MRN:  [id number]   Preop Diagnosis: Hypotension, renal failure, ventilatory dependent respiratory failure  Postop Diagnosis: Same  Procedure: Central line placement  Surgeon: Aviva Signs, MD  Anes: None  Indications: Patient is a 68 year old white male just intubated due to respiratory failure, renal failure, hypotension.  The risks and benefits of the central line were explained to the patient's family, who gave informed consent for the patient as the patient is intubated.  This is an emergency procedure.  Procedure note: Due to coagulopathy, the right femoral vein central line was placed.  The right femoral vein was accessed using the Seldinger technique without difficulty.  A guidewire was then inserted.  A triple-lumen catheter was inserted over the guidewire and the guidewire was removed.  Good venous blood was noted on aspiration of all 3 ports.  All 3 ports were flushed with saline.  The central line was secured in place using 3-0 silk suture x2.  Patient tolerated the procedure well.  Complications: None  EBL: Minimal  Specimen: None

## 2019-12-06 NOTE — H&P (Signed)
NAME:  JOCELYN LOWERY, MRN:  [id number], DOB:  [birth year], LOS: 0 ADMISSION DATE:  12/23/2019, CONSULTATION DATE:  12/07/2019 REFERRING MD:  TRH, CHIEF COMPLAINT: Altered mental status  Brief History   Septic 68 yo male with hx of RA now with PNA and sepsis.  History of present illness   68 year old with history of bronchiectasis with recent lower lobe PNA secondary to Candida tropicalis vs colonization.  Patient was doing well but became progressively confused over the last 48 hours, brought to [facility] septic, now intubated and on pressors.  Has a history of severe AS with EF 40%, prior CKD, Cr 11/2 1.21 ( CR now 4.1) and RA.  He is now anuric and mildly hyponatremic.  Potassium is 5.4. 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  Patient has elevated liver function tests lactic acid of 3.8, platelet count of 121, INR 3.1.  Past Medical History   . Aortic stenosis   . ARF (acute renal failure) ([place]) 09/11/2019  . Chronic back pain   . Chronic combined systolic (congestive) and diastolic (congestive) heart failure (HCC)    a. EF 30-35% in 2016 b. at 30-35% by echo in 08/2016 c. 06/2019: echo showing EF of 60-65% but found to have severe AS.   [name] Chronic neck pain   . CKD (chronic kidney disease), stage III   . Complete heart block ([place]) 08/29/2019   a. s/p Medtronic dual chamber pacemeker implantation on 08/30/2019.  [name] Coronary artery disease    a. s/p CABG in 06/2015 with LIMA-LAD, SVG-PDA, SVG-Intermediate  . Diabetes mellitus   . Diabetic neuropathy ([place])   . Hyperkalemia   . Hypertension   . Loculated pleural effusion 09/09/2016  . Pancytopenia ([place]) 08/13/2014  . 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  . Rheumatoid arthritis(714.0)   . Seizure ([place])      Significant Hospital  Events   Admitted to [facility] 12/15/2019 Transferred to [facility] 12/06/2019  Consults:  Nephrology PCCM  Procedures:  Intubation  Significant Diagnostic Tests:  As above  Micro Data:  Blood cultures planted and are negative will need sputum cultures  Antimicrobials:  Currently on Rocephin and doxycycline, this will be changed at time of admission to [facility] to include improved gram-negative coverage along with MRSA coverage  Interim history/subjective:  Has been intubated started on pressors since admission [place] [facility]   Blood pressure (!) 94/56, pulse 90, temperature 98.3 F (36.8 C), temperature source Oral, resp. rate 19, SpO2 100 %.    Vent Mode: PRVC FiO2 (%):  [50 %-100 %] 50 % Set Rate:  [16 bmp-18 bmp] 16 bmp Vt Set:  [540 mL] 540 mL PEEP:  [5 cmH20] 5 cmH20 Plateau Pressure:  [25 cmH20] 25 cmH20   Intake/Output Summary (Last 24 hours) at 12/06/2019 1900 Last data filed at 12/06/2019 1851 Gross per 24 hour  Intake 2433.32 ml  Output 60 ml  Net 2373.32 ml   There were no vitals filed for this visit.  Examination: General: thin male intubated HENT: intubated Lungs: coarse Cardiovascular: Loud 4/6 SEM  Abdomen: benign  Extremities: l bka trace edema Neuro: not responsive GU: NA  Resolved Hospital Problem list   NA  Assessment & Plan:  1.  Septic shock: We will expand gram-negative coverage with a history of bronchiectasis along with  MRSA coverage, gently volume expand and continue pressors.  2.  Patient earlier lethargic most likely secondary to combination of sepsis and renal failure  3.  Acute on chronic renal failure with a history of dialysis previously Will consult neurology monitor potassium but currently does not seem to be in need of emergent dialysis  4.  Acute hypoxemic respiratory failure. Most likely recurrent pneumonia secondary to bronchiectasis is the source of his sepsis along with necessary volume expansion  resulting in worsening congestive heart failure and pulmonary edema secondary to his known ejection fraction and severe aortic stenosis.  5.  Coagulopathy secondary to sepsis we will monitor  6.  Diabetes mellitus we will monitor and treat as necessary  Patient is extremely ill and with his worsening acute renal failure, cardiac issues including reduced ejection fraction and severe aortic stenosis complicating volume resuscitation and treatment of his sepsis, along with a likely source of bronchiectasis for his sepsis making recovery of an organism difficult, his prognosis seems extremely poor.     Best practice:  Diet: N.p.o. Pain/Anxiety/Delirium protocol (if indicated): Continue with sedation as necessary VAP protocol (if indicated): Yes DVT prophylaxis: Yes GI prophylaxis: Yes Glucose control: Yes Mobility: Bedrest Code Status: Full Family Communication: not able to reach Disposition: To [facility] ICU for further management  Labs   CBC: Recent Labs  Lab 11/29/2019 1922 12/06/19 0449  WBC 7.9 11.2*  NEUTROABS 7.1  --   HGB 9.1* 9.2*  HCT 29.8* 30.2*  MCV 99.0 97.7  PLT 108* 121*    Basic Metabolic Panel: Recent Labs  Lab 11/28/2019 1922 12/06/19 0449 12/06/19 1327 12/06/19 1644  NA 129* 133* 131*  --   K 5.4* 5.4* 5.6* 4.8  CL 94* 96* 97*  --   CO2 21* 20* 18*  --   GLUCOSE 315* 211* 198*  --   BUN 99* 99* 97*  --   CREATININE 4.04* 4.10* 4.12*  --   CALCIUM 8.1* 8.2* 7.8*  --    GFR: Estimated Creatinine Clearance: 16.1 mL/min (A) (by C-G formula based on SCr of 4.12 mg/dL (H)). Recent Labs  Lab 11/29/2019 1922 12/06/19 0127 12/06/19 0449 12/06/19 1336 12/06/19 1644  WBC 7.9  --  11.2*  --   --   LATICACIDVEN  --  2.8* 3.0* 4.5* 3.8*    Liver Function Tests: Recent Labs  Lab 12/26/2019 1922 12/06/19 0449 12/06/19 1327  AST 548* 475* 411*  ALT 507* 466* 447*  ALKPHOS 145* 150* 149*  BILITOT 2.7* 2.7* 2.5*  PROT 6.4* 6.3* 6.2*  ALBUMIN 3.0* 3.0* 3.0*     No results for input(s): LIPASE, AMYLASE in the last 168 hours. Recent Labs  Lab 12/26/2019 2018  AMMONIA 30    ABG    Component Value Date/Time   PHART 7.394 12/06/2019 1625   PCO2ART 34.2 12/06/2019 1625   PO2ART 365 (H) 12/06/2019 1625   HCO3 NOT CALCULATED 12/06/2019 1625   TCO2 31 09/03/2019 1008   ACIDBASEDEF 3.6 (H) 12/06/2019 1625   O2SAT >100.0 12/06/2019 1625     Coagulation Profile: Recent Labs  Lab 12/06/19 0449 12/06/19 1336  INR 2.8* 3.1*    Cardiac Enzymes: No results for input(s): CKTOTAL, CKMB, CKMBINDEX, TROPONINI in the last 168 hours.  HbA1C: Hgb A1c MFr Bld  Date/Time Value Ref Range Status  12/06/2019 04:49 AM 6.5 (H) 4.8 - 5.6 % Final    Comment:    (NOTE) Pre diabetes:  5.7%-6.4% Diabetes:              >6.4% Glycemic control for   <7.0% adults with diabetes   08/29/2019 05:51 PM 6.5 (H) 4.8 - 5.6 % Final    Comment:    (NOTE) Pre diabetes:          5.7%-6.4% Diabetes:              >6.4% Glycemic control for   <7.0% adults with diabetes     CBG: Recent Labs  Lab 12/06/19 0551 12/06/19 0815 12/06/19 1135 12/06/19 1626  GLUCAP 231* 211* 207* 180*    Review of Systems:   Unobtainable  Past Medical History  He,  has a past medical history of Aortic stenosis, ARF (acute renal failure) (HCC) (09/11/2019), Chronic back pain, Chronic combined systolic (congestive) and diastolic (congestive) heart failure (HCC), Chronic neck pain, CKD (chronic kidney disease), stage III, Complete heart block ([place]) (08/29/2019), Coronary artery disease, Diabetes mellitus, Diabetic neuropathy ([place]), Hyperkalemia, Hypertension, Loculated pleural effusion (09/09/2016), Pancytopenia ([place]) (08/13/2014), Prolonged QT interval (08/14/2014), Rheumatoid arthritis(714.0), and Seizure ([place]) (08/13/2014).   Surgical History    Past Surgical History:  Procedure Laterality Date  . 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 CARDIAC SURGERY    .  FRACTURE SURGERY    . MUSCLE BIOPSY  06/2016  . PACEMAKER IMPLANT N/A 08/30/2019   Procedure: PACEMAKER IMPLANT;  Surgeon: Evans Lance, MD;  Location: [place] [facility];  Service: Cardiovascular;  Laterality: N/A;  . right foot surgery-Toe amputations    . RIGHT/LEFT HEART CATH AND CORONARY/GRAFT ANGIOGRAPHY N/A 09/03/2019   Procedure: RIGHT/LEFT HEART CATH AND CORONARY/GRAFT ANGIOGRAPHY;  Surgeon: Leonie Man, MD;  Location: [place] [facility];  Service: Cardiovascular;  Laterality: N/A;  . TEMPORARY PACEMAKER N/A 08/29/2019   Procedure: TEMPORARY PACEMAKER;  Surgeon: Sherren Mocha, MD;  Location: [place] [facility];  Service: Cardiovascular;  Laterality: N/A;  . TOTAL HIP ARTHROPLASTY Left      Social History   reports that he has never smoked. He has never used smokeless tobacco. He reports that he does not drink alcohol or use drugs.   Family History   His family history includes Alzheimer's disease in his father; COPD in his brother; Diabetes in his father; Head & neck cancer in his nephew; Heart attack in his brother; Hypertension in his mother; Stroke in his mother.   Allergies Allergies  Allergen Reactions  . Sulfa Antibiotics Rash     Home Medications  Prior to Admission medications   Medication Sig Start Date End Date Taking? Authorizing Provider  amitriptyline (ELAVIL) 50 MG tablet TAKE 1 AND 1/2 TABLETS BY MOUTH AT BEDTIME. Patient taking differently: Take 75 mg by mouth at bedtime.  05/06/19  Yes [name], MD  aspirin EC 81 MG tablet Take 81 mg by mouth daily.   Yes [provider]  atorvastatin (LIPITOR) 80 MG tablet TAKE ONE TABLET BY MOUTH ONCE DAILY. Patient taking differently: Take 80 mg by mouth daily.  06/03/19  Yes [name], MD  Cholecalciferol (VITAMIN D3) 25 MCG (1000 UT) CHEW Chew 1,000 Units by mouth daily.    Yes [provider]  clonazePAM (KLONOPIN) 0.25 MG disintegrating tablet DISSOLVE 1 TABLET BY MOUTH DAILY AS  NEEDED FOR ANXIETY. Patient taking differently: Take 0.25 mg by mouth daily as needed (for anxiety- dissolve in the mouth).  07/22/19  Yes [name], MD  docusate sodium (COLACE)  100 MG capsule Take 100 mg by mouth 3 (three) times daily.    Yes [provider]  DULoxetine (CYMBALTA) 30 MG capsule Take 30 mg by mouth at bedtime.  10/16/18 12/09/2019 Yes [provider]  finasteride (PROSCAR) 5 MG tablet Take 5 mg by mouth daily.  04/24/18  Yes [provider]  hydroxychloroquine (PLAQUENIL) 200 MG tablet Take 200 mg by mouth daily.   Yes [provider]  insulin aspart protamine- aspart (NOVOLOG MIX 70/30) (70-30) 100 UNIT/ML injection Inject 12 units into skin in the morning  and 8 units evening Patient taking differently: Inject 8-12 Units into the skin See admin instructions. Inject 12 units into the skin in the morning and 8 units in the evening 05/06/19  Yes [name], MD  methadone (DOLOPHINE) 10 MG tablet Take 10 mg by mouth every 6 (six) hours.  04/18/18  Yes [provider]  methadone (DOLOPHINE) 5 MG tablet Take 5 mg by mouth 3 (three) times daily as needed (for breakthrough pain).  04/18/18  Yes [provider]  Polyvinyl Alcohol-Povidone (REFRESH OP) Place 1 drop into both eyes 3 (three) times daily as needed (for dry eyes).    Yes [provider]  predniSONE (DELTASONE) 5 MG tablet TAKE 1 TABLET BY MOUTH ONCE A DAY WITH BREAKFAST. Patient taking differently: Take 5 mg by mouth daily with breakfast.  06/03/19  Yes [name], MD  pregabalin (LYRICA) 25 MG capsule TAKE ONE CAPSULE BY MOUTH AT BEDTIME. Patient taking differently: Take 25 mg by mouth at bedtime.  06/03/19  Yes [name], MD  sodium chloride HYPERTONIC 3 % nebulizer solution Take by nebulization 2 (two) times daily. 10/31/19  Yes Noemi Chapel P, DO  tamsulosin (FLOMAX) 0.4 MG CAPS capsule Take 1 capsule (0.4 mg total) by mouth at bedtime. 05/06/19   Yes [name], MD  torsemide (DEMADEX) 10 MG tablet TAKE 2 TABLETS BY MOUTH EVERY OTHER DAY. Patient taking differently: Take 20 mg by mouth every other day.  05/06/19  Yes [name], MD  voriconazole (VFEND) 200 MG tablet Take 1 tablet (200 mg total) by mouth 2 (two) times daily. 10/31/19  Yes Noemi Chapel P, DO  blood glucose meter kit and supplies KIT Dispense based on patient and insurance preference. Use up to 3 times daily as directed. DX:E11.9 08/21/19   [name], MD  nitroGLYCERIN (NITROSTAT) 0.4 MG SL tablet Place 1 tablet (0.4 mg total) under the tongue every 5 (five) minutes x 3 doses as needed for chest pain. 09/04/19   [facility], Tami Lin, PA  Respiratory Therapy Supplies (FLUTTER) DEVI 1 each by Does not apply route 2 (two) times daily. 10/31/19   Julian Hy, DO     Critical care time: Over 35 minutes was spent in chart review, bedside evaluation and examination and critical care planning.

## 2019-12-06 NOTE — Progress Notes (Signed)
Pt was given 2mg  versed, 54ml of Etomidate and 50ml of Rocuronium for intubation. Orders given by Dr. Waldron [facility] and anaestheologist . RT at bedside with vent. Wife also remained at bedside. Pt on pressor prior to intubation per orders. Will continue to monitor closely.

## 2019-12-06 NOTE — Progress Notes (Signed)
PROGRESS NOTE                                                                                                                                                                                                             Patient Demographics:    [name], is a 68 y.o. male, DOB - 1951/08/19, IFO:[id number]  Admit date - 11/29/2019   Admitting Physician Albertine Patricia, MD  Outpatient Primary MD for the patient is Luking, [name], MD  LOS - 0   Chief Complaint  Patient presents with  . Altered Mental Status       Brief Narrative    68 y.o. male with medical history significant for rheumatoid arthritis, hypertension, coronary artery disease, diabetes mellitus, diastolic and systolic CHF, severe aortic stenosis, CKD 3, left above-knee amputation, shunt with history of renal failure at one point requiring brief hemodialysis 3 years ago, patient being actively worked up for TAVR as an outpatient for his severe aortic stenosis, patient presents to ED secondary to feeling ill for last few days, increased encephalopathy and lethargy admitted for mental status, in ED his work-up was significant for bibasilar pneumonia, hypoxia, sepsis on presentation, with worsening creatinine at 4.1, from baseline of 1.5, evaded LFTs, coagulopathy with elevated INR as well, CT head negative for no acute findings, patient admitted to ICU, patient with progressive dyspnea, hypoxia, remains significantly encephalopathic, with increased work of breathing, he has anuric renal failure with no urine output despite multiple boluses given, he is hypotensive as well was intubated by anesthesia, and started on pressor support, and being transferred to [facility] ICU for further management.    Subjective:    [name] today remains anuric, required intubation, and started on pressor support .   Assessment  & Plan :    Active Problems:   AKI (acute kidney injury) ([place])  Encounter for central line placement  Septic shock -Present on admission, work-up so far significant for up pneumonia, continue with Rocephin and doxycycline. - contiune with pressors  - continue with stress dose steroids  Acute encephalopathy -Patient is significantly altered, lethargic and obtunded, this is most likely secondary to his uremia, with BUN of 99 from renal failure, as well polypharmacy in the setting accumulative effect in the setting of renal failure, meds include clonazepam, methadone and Lyrica.  Acute on  chronic renal failure stage IV. -Patient currently in anuric renal failure, hold nephrotoxic medication, avoid hypotension, and continue with IV fluids including bicarb 75 cc/h for total of 20 minutes. -Discussed with renal, patient to be seen by nephrology on admission to [facility]. -With hyperkalemia, now intubated with NG tube, will give Lokelma, did receive D50 and IV insulin for hyperkalemia.  Acute hypoxic respiratory failure -Multifactorial, volume overload, bibasilar pneumonia, and encephalopathy. -Required intubation, continue with ventilatory support, adjust setting according to ABGs. -As needed fentanyl and Versed for sedation. -Voriconazole has been started out an outpatient ID/pulmonary for known history of significant bronchiectasis with Candida, will hold voriconazole given elevated LFTs  Chronic pain syndrome -Methadone on hold, on as needed fentanyl  Transaminitis/liver failure -Appears to be progressive, hepatitis panel is negative, continue to hold voriconazole. -INR elevated, will give vitamin K.  Coagulopathy -Secondary to liver failure, will give vitamin K -Continue to hold voriconazole  Diabetes mellitus -Hold home 70/30 insulin  - continue with ISS  Chronic Diastolic and systolic CHF - Last echo 40/9811 EF 40 to 45%, indeterminate left ventricular diastolic parameters. -Hold aspirin and statins   History of coronary artery disease,  pacemaker status  - hx of CABG, 08/2019 cath: severe 4 vessel native disease. Patent LIMA-LAD, SVG-RI, moderate prox disease of SVG-RPDA.  EKG today showing left bundle branch block, pacer spikes present.  08/2019- he had intermittent heart block  and had a pacemaker placed. -Hold aspirin and statins while n.p.o.  Severe aortic stenosis -follows with Dr. [name], valvular heart disease clinic.  Code Status : Full Code  Family Communication  : D/W wife and son at bedside  Disposition Plan  : transfer to [facility] ICU  Consults  :  D/W PCCm and Renal  Procedures  : right Femoral TLC  DVT Prophylaxis  : SCDs   Lab Results  Component Value Date   PLT 121 (L) 12/06/2019    Antibiotics  :    Anti-infectives (From admission, onward)   Start     Dose/Rate Route Frequency Ordered Stop   11/27/2019 2315  cefTRIAXone (ROCEPHIN) 1 g in sodium chloride 0.9 % 100 mL IVPB     1 g 200 mL/hr over 30 Minutes Intravenous Every 24 hours 12/03/2019 2302     12/18/2019 2315  doxycycline (VIBRAMYCIN) 100 mg in sodium chloride 0.9 % 250 mL IVPB     100 mg 125 mL/hr over 120 Minutes Intravenous Every 12 hours 12/06/2019 2302          Objective:   Vitals:   12/06/19 1530 12/06/19 1545 12/06/19 1600 12/06/19 1615  BP: (!) 92/48 (!) 101/42 (!) 102/45 (!) 97/36  Pulse: 93 90 87 87  Resp: 18 (!) 23 (!) 23 18  Temp:    98.3 F (36.8 C)  TempSrc:    Oral  SpO2: 99% 100% 100% 100%    Wt Readings from Last 3 Encounters:  11/25/19 73.9 kg  11/12/19 76.2 kg  11/04/19 72.6 kg     Intake/Output Summary (Last 24 hours) at 12/06/2019 1631 Last data filed at 11/29/2019 2029 Gross per 24 hour  Intake 500 ml  Output --  Net 500 ml     Physical Exam  Patient is currently sedated, intubated, in no apparent distress Symmetrical Chest wall movement, Good air movement bilaterally, vent supported respiratory sounds RRR,No Gallops,Rubs or new Murmurs, No Parasternal Heave +ve B.Sounds, Abd Soft, No tenderness,  No rebound - guarding or rigidity. No Cyanosis, right femoral TLC,  left BKA    Data Review:    CBC Recent Labs  Lab 12/21/2019 1922 12/06/19 0449  WBC 7.9 11.2*  HGB 9.1* 9.2*  HCT 29.8* 30.2*  PLT 108* 121*  MCV 99.0 97.7  MCH 30.2 29.8  MCHC 30.5 30.5  RDW 18.1* 18.6*  LYMPHSABS 0.3*  --   MONOABS 0.5  --   EOSABS 0.0  --   BASOSABS 0.0  --     Chemistries  Recent Labs  Lab 12/11/2019 1922 12/06/19 0449 12/06/19 1327  NA 129* 133* 131*  K 5.4* 5.4* 5.6*  CL 94* 96* 97*  CO2 21* 20* 18*  GLUCOSE 315* 211* 198*  BUN 99* 99* 97*  CREATININE 4.04* 4.10* 4.12*  CALCIUM 8.1* 8.2* 7.8*  AST 548* 475* 411*  ALT 507* 466* 447*  ALKPHOS 145* 150* 149*  BILITOT 2.7* 2.7* 2.5*   ------------------------------------------------------------------------------------------------------------------ No results for input(s): CHOL, HDL, LDLCALC, TRIG, CHOLHDL, LDLDIRECT in the last 72 hours.  Lab Results  Component Value Date   HGBA1C 6.5 (H) 12/06/2019   ------------------------------------------------------------------------------------------------------------------ No results for input(s): TSH, T4TOTAL, T3FREE, THYROIDAB in the last 72 hours.  Invalid input(s): FREET3 ------------------------------------------------------------------------------------------------------------------ No results for input(s): VITAMINB12, FOLATE, FERRITIN, TIBC, IRON, RETICCTPCT in the last 72 hours.  Coagulation profile Recent Labs  Lab 12/06/19 0449 12/06/19 1336  INR 2.8* 3.1*    No results for input(s): DDIMER in the last 72 hours.  Cardiac Enzymes No results for input(s): CKMB, TROPONINI, MYOGLOBIN in the last 168 hours.  Invalid input(s): CK ------------------------------------------------------------------------------------------------------------------    Component Value Date/Time   BNP 1,766.3 (H) 09/11/2019 2013    Inpatient Medications  Scheduled Meds: . bisacodyl   10 mg Rectal Q6H  . Chlorhexidine Gluconate Cloth  6 each Topical Daily  . fentaNYL      . hydrocortisone sod succinate (SOLU-CORTEF) inj  50 mg Intravenous Q6H  . insulin aspart  0-15 Units Subcutaneous Q4H  . sodium chloride flush  10-40 mL Intracatheter Q12H  . sodium chloride flush  10-40 mL Intracatheter Q12H   Continuous Infusions: . sodium chloride    . cefTRIAXone (ROCEPHIN)  IV Stopped (12/06/19 0129)  . doxycycline (VIBRAMYCIN) IV 100 mg (12/06/19 1225)  . lactated ringers 125 mL/hr at 12/06/19 0635  . norepinephrine (LEVOPHED) Adult infusion    . phenylephrine (NEO-SYNEPHRINE) Adult infusion 20 mcg/min (12/06/19 1442)  . sodium bicarbonate 150 mEq in dextrose 5% 1000 mL 150 mEq (12/06/19 1441)   PRN Meds:.acetaminophen **OR** acetaminophen, fentaNYL (SUBLIMAZE) injection, fentaNYL (SUBLIMAZE) injection, midazolam, midazolam, naLOXone (NARCAN)  injection, polyethylene glycol, sodium chloride flush, sodium chloride flush  Micro Results Recent Results (from the past 240 hour(s))  Blood culture (routine x 2)     Status: None (Preliminary result)   Collection Time: 12/16/2019  7:35 PM   Specimen: BLOOD  Result Value Ref Range Status   Specimen Description BLOOD LEFT ANTECUBITAL  Final   Special Requests AEROBIC BOTTLE ONLY Blood Culture adequate volume  Final   Culture   Final    NO GROWTH < 12 HOURS Performed at [facility], [address] [place], [place] [address]    Report Status PENDING  Incomplete  Blood culture (routine x 2)     Status: None (Preliminary result)   Collection Time: 12/03/2019  8:18 PM   Specimen: BLOOD LEFT HAND  Result Value Ref Range Status   Specimen Description BLOOD LEFT HAND  Final   Special Requests   Final    BOTTLES DRAWN AEROBIC  AND ANAEROBIC Blood Culture adequate volume   Culture   Final    NO GROWTH < 12 HOURS Performed at [facility], [address] [place], [place] [address]    Report Status PENDING  Incomplete  SARS CORONAVIRUS 2  (TAT 6-24 HRS) Nasopharyngeal Nasopharyngeal Swab     Status: None   Collection Time: 12/18/2019  8:56 PM   Specimen: Nasopharyngeal Swab  Result Value Ref Range Status   SARS Coronavirus 2 NEGATIVE NEGATIVE Final    Comment: (NOTE) SARS-CoV-2 target nucleic acids are NOT DETECTED. The SARS-CoV-2 RNA is generally detectable in upper and lower respiratory specimens during the acute phase of infection. Negative results do not preclude SARS-CoV-2 infection, do not rule out co-infections with other pathogens, and should not be used as the sole basis for treatment or other patient management decisions. Negative results must be combined with clinical observations, patient history, and epidemiological information. The expected result is Negative. Fact Sheet for Patients: SugarRoll.be Fact Sheet for Healthcare Providers: https://www.woods-mathews.com/ This test is not yet approved or cleared by the [place] FDA and  has been authorized for detection and/or diagnosis of SARS-CoV-2 by FDA under an Emergency Use Authorization (EUA). This EUA will remain  in effect (meaning this test can be used) for the duration of the COVID-19 declaration under Section 56 4(b)(1) of the Act, 21 U.S.C. section 360bbb-3(b)(1), unless the authorization is terminated or revoked sooner. Performed at [facility], [place] [address] [place], [place] [address]     Radiology Reports CT ABDOMEN PELVIS WO CONTRAST  Result Date: 12/16/2019 CLINICAL DATA:  Nausea and vomiting. EXAM: CT ABDOMEN AND PELVIS WITHOUT CONTRAST TECHNIQUE: Multidetector CT imaging of the abdomen and pelvis was performed following the standard protocol without IV contrast. COMPARISON:  Abdominal CTA 10/28/2019 FINDINGS: Lower chest: Multi chamber cardiomegaly. Dense coronary artery calcifications. Small pleural effusions with irregular consolidative and ground-glass opacities in both lower lobes.  Hepatobiliary: Small volume of perihepatic ascites is new from prior CT. No gross focal hepatic lesion allowing for lack contrast and streak artifact from arms down positioning. Layering hyperdensity in the gallbladder likely combination of stones and sludge. Motion limits detailed assessment for pericholecystic stranding. No biliary dilatation. Pancreas: Diffuse fatty atrophy. No ductal dilatation or inflammation. Spleen: Normal in size without focal abnormality. Adrenals/Urinary Tract: Normal adrenal glands. No hydronephrosis. Mild thinning of bilateral renal parenchyma with lobulated renal contours. 15 mm lesion from the interpolar left kidney unchanged from prior exam. Advanced renal vascular calcifications. Urinary bladder is partially distended. Left aspect of the bladder is obscured by streak artifact from left hip arthroplasty. Stomach/Bowel: Air-fluid level in the stomach which is mildly distended. No gastric or duodenal wall thickening. No small bowel dilatation or obstruction. Appendix is tentatively visualized and normal. No evidence of appendicitis. Large stool burden in the ascending transverse, and proximal descending colon. More distal descending colon is air-filled distal colon is decompressed. There is diffuse colonic tortuosity. No evidence of colonic wall thickening or inflammation. Vascular/Lymphatic: Advanced vascular disease. Advanced aortic and branch atherosclerosis. No aortic aneurysm. No bulky abdominopelvic adenopathy. Reproductive: No acute findings. Borderline enlarged prostate gland. Other: Small volume of ascites in the pelvis, both pericolic gutters, and right upper quadrant. No free air. No loculated intra-abdominal fluid collection. Mild body wall edema, confluent in the flanks. Small fat containing umbilical hernia. Fat in the left greater than right inguinal canal. Musculoskeletal: Multilevel degenerative change in the spine. Similar appearance of degenerative disc disease at  L3-L4. There  are no acute or suspicious osseous abnormalities. Left hip arthroplasty. IMPRESSION: 1. Mild gastric distension with air-fluid level, can be seen with gastroenteritis or gastroparesis. No gastric wall thickening or evidence of gastric outlet obstruction. 2. Small volume of ascites in the abdomen and pelvis, new from prior CT. Mild body wall edema. 3. Large stool burden with colonic tortuosity, suggesting constipation. No bowel obstruction. 4. Bilateral pleural effusions. Irregular streaky consolidative airspace opacities in both lower lobes, increased from CT last month. Pneumonia or aspiration considered. 5. Severe aortic and branch atherosclerosis. Aortic Atherosclerosis (ICD10-I70.0). Electronically Signed   By: Keith Rake M.D.   On: 12/20/2019 23:58   CT Head Wo Contrast  Result Date: 11/26/2019 CLINICAL DATA:  Encephalopathy EXAM: CT HEAD WITHOUT CONTRAST TECHNIQUE: Contiguous axial images were obtained from the base of the skull through the vertex without intravenous contrast. COMPARISON:  None. FINDINGS: Brain: There is no mass, hemorrhage or extra-axial collection. The size and configuration of the ventricles and extra-axial CSF spaces are normal. There is hypoattenuation of the white matter, most commonly indicating chronic small vessel disease. Vascular: Atherosclerotic calcification of the vertebral and internal carotid arteries at the skull base. No abnormal hyperdensity of the major intracranial arteries or dural venous sinuses. Skull: The visualized skull base, calvarium and extracranial soft tissues are normal. Sinuses/Orbits: No fluid levels or advanced mucosal thickening of the visualized paranasal sinuses. No mastoid or middle ear effusion. The orbits are normal. IMPRESSION: Findings of chronic small vessel disease without acute intracranial abnormality. Electronically Signed   By: Ulyses Jarred M.D.   On: 12/07/2019 19:28   DG Chest Port 1 View  Result Date:  12/06/2019 CLINICAL DATA:  Hypoxia. EXAM: PORTABLE CHEST 1 VIEW COMPARISON:  Chest x-ray and abdominal CT scan dated 12/23/2019 FINDINGS: There overall heart size and pulmonary vascularity are normal. The left lung is now clear. Persistent atelectasis at the right lung base. Increased density posterior medially at the right base is consistent with the infiltrate seen on the prior CT scan. Slight gaseous distention of the stomach, unchanged. No acute bone abnormality. Prominent arthritic changes at the shoulders, right greater than left. IMPRESSION: 1. Increased density posterior medially at the right lung base consistent with the infiltrate seen on the prior CT scan. 2. Persistent atelectasis at the right lung base. 3. Clearing of the left lung. Electronically Signed   By: Lorriane Shire M.D.   On: 12/06/2019 13:26   DG Chest Port 1 View  Result Date: 11/27/2019 CLINICAL DATA:  Chronic cough and shortness of breath EXAM: PORTABLE CHEST 1 VIEW COMPARISON:  September 11, 2019 FINDINGS: Patchy opacities are identified in the left suprahilar region, right lung base. Elevated right hemidiaphragm is identified, chronic. There is no pleural effusion. Cardiac pacemaker is unchanged. The heart size is enlarged. IMPRESSION: Patchy opacity identified in the left perihilar region and right lung base, pneumonia not excluded. Electronically Signed   By: Abelardo Diesel M.D.   On: 12/06/2019 19:58   DG Chest Port 1V same Day  Result Date: 12/06/2019 CLINICAL DATA:  Status post intubation. EXAM: PORTABLE CHEST 1 VIEW COMPARISON:  Same day. FINDINGS: Stable cardiomegaly. Endotracheal and nasogastric tubes appear to be in grossly good position. Left-sided pacemaker is unchanged. No pneumothorax pleural effusion is noted. Stable right basilar atelectasis or infiltrate is noted. Bony thorax is unremarkable. IMPRESSION: Stable right basilar atelectasis or infiltrate. Support apparatus in grossly good position. Electronically  Signed   By: Marijo Conception M.D.   On: 12/06/2019  15:38   ECHOCARDIOGRAM COMPLETE  Result Date: 11/08/2019   ECHOCARDIOGRAM REPORT   Patient Name:   PAVLE WILER Shadwick Date of Exam: 11/08/2019 Medical Rec #:  [id number]    Height:       69.5 in Accession #:    [id number]   Weight:       160.0 lb Date of Birth:  1951-04-03    BSA:          1.89 m Patient Age:    28 years     BP:           156/83 mmHg Patient Gender: M            HR:           92 bpm. Exam Location:  [facility] Procedure: 2D Echo, Cardiac Doppler and Color Doppler Indications:    Aortic valve stenosis 424.1 / 135.0 Pre TAVR evaluation  History:        Patient has prior history of Echocardiogram examinations, most                 recent 07/02/2019. CHF, Aortic Valve Disease, Arrythmias:LBBB;                 Risk Factors:Dyslipidemia, Hypertension and Diabetes. Stage 3                 chronic kidney disease, Heart block AV third degree, Severe                 aortic stenosis by prior echocardiogram.  Sonographer:    Alvino Chapel RCS Referring Phys: [place]  1. Left ventricular ejection fraction, by visual estimation, is 40 to 45%. The left ventricle has mildly decreased function. There is mildly increased left ventricular hypertrophy.  2. Left ventricular diastolic parameters are indeterminate.  3. Global right ventricle has low normal systolic function.The right ventricular size is normal. No increase in right ventricular wall thickness.  4. Left atrial size was severely dilated.  5. Right atrial size was normal.  6. Moderate calcification of the mitral valve leaflet(s).  7. Moderate mitral annular calcification.  8. Moderate thickening of the mitral valve leaflet(s).  9. Severe aortic valve annular calcification. 10. The mitral valve is abnormal. Mild to moderate mitral valve regurgitation. Mild mitral stenosis. 11. The tricuspid valve is normal in structure. Tricuspid valve regurgitation is not demonstrated. 12. Aortic valve  regurgitation is moderate. 13. The aortic valve has an indeterminant number of cusps. Aortic valve regurgitation is moderate. Severe aortic valve stenosis. 14. There is Severe calcifcation of the aortic valve. 15. There is Severely thickening of the aortic valve. 16. The pulmonic valve was not well visualized. Pulmonic valve regurgitation is not visualized. 17. The interatrial septum was not well visualized. FINDINGS  Left Ventricle: Left ventricular ejection fraction, by visual estimation, is 40 to 45%. The left ventricle has mildly decreased function. There is mildly increased left ventricular hypertrophy. Left ventricular diastolic parameters are indeterminate. Right Ventricle: The right ventricular size is normal. No increase in right ventricular wall thickness. Global RV systolic function is has low normal systolic function. Left Atrium: Left atrial size was severely dilated. Right Atrium: Right atrial size was normal in size Pericardium: There is no evidence of pericardial effusion. Mitral Valve: The mitral valve is abnormal. There is moderate thickening of the mitral valve leaflet(s). There is moderate calcification of the mitral valve leaflet(s). Moderate mitral annular calcification. Mild mitral valve stenosis by observation. Mild  to moderate mitral valve regurgitation. Tricuspid Valve: The tricuspid valve is normal in structure. Tricuspid valve regurgitation is not demonstrated. Aortic Valve: The aortic valve has an indeterminant number of cusps. . There is Severely thickening and Severe calcifcation of the aortic valve. Aortic valve regurgitation is moderate. Aortic regurgitation PHT measures 275 msec. Severe aortic stenosis is  present. Severe aortic valve annular calcification. There is Severely thickening of the aortic valve. Severe calcifcation. Aortic valve mean gradient measures 44.3 mmHg. Aortic valve peak gradient measures 77.6 mmHg. Aortic valve area, by VTI measures 0.59 cm. Pulmonic Valve: The  pulmonic valve was not well visualized. Pulmonic valve regurgitation is not visualized. No evidence of pulmonic stenosis. Aorta: The aortic root is normal in size and structure. Pulmonary Artery: Indeterminant PASP, inadequate TR jet. IAS/Shunts: The interatrial septum was not well visualized.  LEFT VENTRICLE PLAX 2D LVIDd:         4.41 cm LVIDs:         3.30 cm LV PW:         1.12 cm LV IVS:        1.11 cm LVOT diam:     2.00 cm LV SV:         44 ml LV SV Index:   23.30 LVOT Area:     3.14 cm  LV Volumes (MOD) LV area d, A2C:    35.90 cm LV area d, A4C:    34.90 cm LV area s, A2C:    24.10 cm LV area s, A4C:    26.60 cm LV major d, A2C:   8.66 cm LV major d, A4C:   8.07 cm LV major s, A2C:   7.85 cm LV major s, A4C:   7.39 cm LV vol d, MOD A2C: 123.0 ml LV vol d, MOD A4C: 123.0 ml LV vol s, MOD A2C: 66.7 ml LV vol s, MOD A4C: 79.6 ml LV SV MOD A2C:     56.3 ml LV SV MOD A4C:     123.0 ml LV SV MOD BP:      53.2 ml RIGHT VENTRICLE TAPSE (M-mode): 1.5 cm LEFT ATRIUM              Index       RIGHT ATRIUM           Index LA diam:        3.90 cm  2.07 cm/m  RA Area:     15.10 cm LA Vol (A2C):   116.0 ml 61.43 ml/m RA Volume:   35.40 ml  18.75 ml/m LA Vol (A4C):   109.0 ml 57.72 ml/m LA Biplane Vol: 117.0 ml 61.96 ml/m  AORTIC VALVE AV Area (Vmax):    0.51 cm AV Area (Vmean):   0.57 cm AV Area (VTI):     0.59 cm AV Vmax:           440.33 cm/s AV Vmean:          306.000 cm/s AV VTI:            0.909 m AV Peak Grad:      77.6 mmHg AV Mean Grad:      44.3 mmHg LVOT Vmax:         70.90 cm/s LVOT Vmean:        55.700 cm/s LVOT VTI:          0.170 m LVOT/AV VTI ratio: 0.19 AI PHT:            275 msec  AORTA Ao Root diam: 3.00 cm MITRAL VALVE MV Area (PHT): 6.96 cm             SHUNTS MV Mean grad:  3.7 mmHg             Systemic VTI:  0.17 m MV VTI:        0.52 m               Systemic Diam: 2.00 cm MV PHT:        31.61 msec MV Decel Time: 109 msec MR Peak grad: 148.8 mmHg MR Mean grad: 96.0 mmHg MR Vmax:       610.00 cm/s MR Vmean:     459.0 cm/s MV E velocity: 188.00 cm/s 103 cm/s  Carlyle Dolly MD Electronically signed by Carlyle Dolly MD Signature Date/Time: 11/08/2019/4:35:22 PM    Final    The patient is critically ill with multi-organ failure.  Critical care was necessary to treat or prevent imminent or life-threatening deterioration of sepsis, respiratory failure, cardiac failure, renal failure ,liver failure and was exclusive of separately billable procedures and treating other patients. Total critical care time spent by me: 45 minutes  Time spent personally by me on obtaining history from patient or surrogate, evaluation of the patient, evaluation of patient's response to treatment, ordering and review of laboratory studies, ordering and review of radiographic studies, ordering and performing treatments and interventions, and re-evaluation of the patient's condition.   Phillips Climes M.D on 12/06/2019 at 4:31 PM  Between 7am to 7pm - Pager - [phone]  After 7pm go to [email or web address] - password [facility]  [facility] -  Office  [phone]

## 2019-12-06 NOTE — ED Notes (Signed)
CRITICAL VALUE ALERT  Critical Value:  Lactic acid 2.8  Date & Time Notied:  0207 12/06/2019  Provider Notified: dr.lama  Orders Received/Actions taken: n/a

## 2019-12-06 NOTE — Progress Notes (Signed)
MEDICATION RELATED CONSULT NOTE - INITIAL   Pharmacy Consult for Anidulafungin Indication: hx bronchiectasis with Candida- holding voriconazole given elevated LFTs  Allergies  Allergen Reactions  . Sulfa Antibiotics Rash    Patient Measurements:    Vital Signs: Temp: 98.3 F (36.8 C) (12/11 1615) Temp Source: Oral (12/11 1615) BP: 97/36 (12/11 1615) Pulse Rate: 87 (12/11 1615) Intake/Output from previous day: 12/10 0701 - 12/11 0700 In: 500 [IV Piggyback:500] Out: -  Intake/Output from this shift: Total I/O In: 1541.9 [I.V.:984.8; IV Piggyback:557.1] Out: 60 [Urine:60]  Labs: Recent Labs    12/07/2019 1922 12/04/2019 1944 12/06/19 0449 12/06/19 1327  WBC 7.9  --  11.2*  --   HGB 9.1*  --  9.2*  --   HCT 29.8*  --  30.2*  --   PLT 108*  --  121*  --   CREATININE 4.04*  --  4.10* 4.12*  LABCREA  --  167.32  --   --   ALBUMIN 3.0*  --  3.0* 3.0*  PROT 6.4*  --  6.3* 6.2*  AST 548*  --  475* 411*  ALT 507*  --  466* 447*  ALKPHOS 145*  --  150* 149*  BILITOT 2.7*  --  2.7* 2.5*  BILIDIR  --   --  1.7*  --   IBILI  --   --  1.0*  --    Estimated Creatinine Clearance: 16.1 mL/min (A) (by C-G formula based on SCr of 4.12 mg/dL (H)).   Microbiology: Recent Results (from the past 720 hour(s))  Blood culture (routine x 2)     Status: None (Preliminary result)   Collection Time: 11/26/2019  7:35 PM   Specimen: BLOOD  Result Value Ref Range Status   Specimen Description BLOOD LEFT ANTECUBITAL  Final   Special Requests AEROBIC BOTTLE ONLY Blood Culture adequate volume  Final   Culture   Final    NO GROWTH < 12 HOURS Performed at [facility], [address], [place], [place] [address]    Report Status PENDING  Incomplete  Blood culture (routine x 2)     Status: None (Preliminary result)   Collection Time: 12/12/2019  8:18 PM   Specimen: BLOOD LEFT HAND  Result Value Ref Range Status   Specimen Description BLOOD LEFT HAND  Final   Special Requests   Final    BOTTLES  DRAWN AEROBIC AND ANAEROBIC Blood Culture adequate volume   Culture   Final    NO GROWTH < 12 HOURS Performed at [facility], [address] [place], [place] [address]    Report Status PENDING  Incomplete  SARS CORONAVIRUS 2 (TAT 6-24 HRS) Nasopharyngeal Nasopharyngeal Swab     Status: None   Collection Time: 12/12/2019  8:56 PM   Specimen: Nasopharyngeal Swab  Result Value Ref Range Status   SARS Coronavirus 2 NEGATIVE NEGATIVE Final    Comment: (NOTE) SARS-CoV-2 target nucleic acids are NOT DETECTED. The SARS-CoV-2 RNA is generally detectable in upper and lower respiratory specimens during the acute phase of infection. Negative results do not preclude SARS-CoV-2 infection, do not rule out co-infections with other pathogens, and should not be used as the sole basis for treatment or other patient management decisions. Negative results must be combined with clinical observations, patient history, and epidemiological information. The expected result is Negative. Fact Sheet for Patients: SugarRoll.be Fact Sheet for Healthcare Providers: https://www.woods-mathews.com/ This test is not yet approved or cleared by the [place] FDA and  has  been authorized for detection and/or diagnosis of SARS-CoV-2 by FDA under an Emergency Use Authorization (EUA). This EUA will remain  in effect (meaning this test can be used) for the duration of the COVID-19 declaration under Section 56 4(b)(1) of the Act, 21 U.S.C. section 360bbb-3(b)(1), unless the authorization is terminated or revoked sooner. Performed at [facility], [place] [address] [place], [place] [address]     Medical History: Past Medical History:  Diagnosis Date  . Aortic stenosis   . ARF (acute renal failure) ([place]) 09/11/2019  . Chronic back pain   . Chronic combined systolic (congestive) and diastolic (congestive) heart failure (HCC)    a. EF 30-35% in 2016 b. at 30-35% by echo in  08/2016 c. 06/2019: echo showing EF of 60-65% but found to have severe AS.   [name] Chronic neck pain   . CKD (chronic kidney disease), stage III   . Complete heart block ([place]) 08/29/2019   a. s/p Medtronic dual chamber pacemeker implantation on 08/30/2019.  [name] Coronary artery disease    a. s/p CABG in 06/2015 with LIMA-LAD, SVG-PDA, SVG-Intermediate  . Diabetes mellitus   . Diabetic neuropathy ([place])   . Hyperkalemia   . Hypertension   . Loculated pleural effusion 09/09/2016  . Pancytopenia ([place]) 08/13/2014  . 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  . Rheumatoid arthritis(714.0)   . Seizure ([place]) 08/13/2014   Pt denies    Assessment: Patient on voriconazole PO prior to admission for 4 weeks due to bronchiectasis with Candida.  Patient has elevated LFTs on admission so voriconazole being held.  Discussed with NP who wanted alternative antifungal that could be used with hepatic impairment.   Plan:  Anidulafungin 200 mg IV daily. 1x dose ordered for now. Will follow up with AM labs to monitor LFTs and continuation of medication or if patient able to switch back to voriconazole.  Revonda Standard Carmen Vallecillo 12/06/2019,6:13 PM

## 2019-12-06 NOTE — Anesthesia Procedure Notes (Signed)
Procedure Name: Intubation Date/Time: 12/06/2019 3:00 PM Performed by: Denese Killings, MD Pre-anesthesia Checklist: Patient identified, Emergency Drugs available, Suction available and Patient being monitored Patient Re-evaluated:Patient Re-evaluated prior to induction Oxygen Delivery Method: Ambu bag Preoxygenation: Pre-oxygenation with 100% oxygen Induction Type: IV induction Ventilation: Mask ventilation without difficulty Laryngoscope Size: Glidescope and 3 Grade View: Grade I Tube type: Oral Number of attempts: 1 Airway Equipment and Method: Stylet,  Oral airway and Video-laryngoscopy Placement Confirmation: ETT inserted through vocal cords under direct vision,  positive ETCO2,  breath sounds checked- equal and bilateral and CO2 detector Secured at: 24 cm Tube secured with: Tape Dental Injury: Teeth and Oropharynx as per pre-operative assessment

## 2019-12-06 NOTE — Plan of Care (Signed)
  Problem: SLP Dysphagia Goals Goal: Patient will utilize recommended strategies Description: Patient will utilize recommended strategies during swallow to increase swallowing safety with Flowsheets (Taken 12/06/2019 1007) Patient will utilize recommended strategies during swallow to increase swallowing safety with: mod assist

## 2019-12-06 NOTE — ED Notes (Signed)
CRITICAL VALUE ALERT  Critical Value:  Lactic acid 3.0  Date & Time Notied:  12/06/2019 0610  Provider Notified: dr.lama  Orders Received/Actions taken: see mar

## 2019-12-06 NOTE — Evaluation (Addendum)
Clinical/Bedside Swallow Evaluation Patient Details  Name: [name] MRN: [id number] Date of Birth: 05-Oct-1951  Today's Date: 12/06/2019 Time: SLP Start Time (ACUTE ONLY): 3846 SLP Stop Time (ACUTE ONLY): 0951 SLP Time Calculation (min) (ACUTE ONLY): 26 min  Past Medical History:  Past Medical History:  Diagnosis Date  . Aortic stenosis   . ARF (acute renal failure) ([place]) 09/11/2019  . Chronic back pain   . Chronic combined systolic (congestive) and diastolic (congestive) heart failure (HCC)    a. EF 30-35% in 2016 b. at 30-35% by echo in 08/2016 c. 06/2019: echo showing EF of 60-65% but found to have severe AS.   [name] Chronic neck pain   . CKD (chronic kidney disease), stage III   . Complete heart block ([place]) 08/29/2019   a. s/p Medtronic dual chamber pacemeker implantation on 08/30/2019.  [name] Coronary artery disease    a. s/p CABG in 06/2015 with LIMA-LAD, SVG-PDA, SVG-Intermediate  . Diabetes mellitus   . Diabetic neuropathy ([place])   . Hyperkalemia   . Hypertension   . Loculated pleural effusion 09/09/2016  . Pancytopenia ([place]) 08/13/2014  . 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  . Rheumatoid arthritis(714.0)   . Seizure ([place]) 08/13/2014   Pt denies   Past Surgical History:  Past Surgical History:  Procedure Laterality Date  . 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 CARDIAC SURGERY    . FRACTURE SURGERY    . MUSCLE BIOPSY  06/2016  . PACEMAKER IMPLANT N/A 08/30/2019   Procedure: PACEMAKER IMPLANT;  Surgeon: Evans Lance, MD;  Location: [facility];  Service: Cardiovascular;  Laterality: N/A;  . right foot surgery-Toe amputations    . RIGHT/LEFT HEART CATH AND CORONARY/GRAFT ANGIOGRAPHY N/A 09/03/2019   Procedure: RIGHT/LEFT HEART CATH AND CORONARY/GRAFT ANGIOGRAPHY;  Surgeon: Leonie Man, MD;  Location: [facility];  Service: Cardiovascular;  Laterality: N/A;  . TEMPORARY PACEMAKER N/A 08/29/2019    Procedure: TEMPORARY PACEMAKER;  Surgeon: Sherren Mocha, MD;  Location: [facility];  Service: Cardiovascular;  Laterality: N/A;  . TOTAL HIP ARTHROPLASTY Left    HPI:  SHIHAB STATES is a 68 y.o. male with medical history significant for rheumatoid arthritis, hypertension, coronary artery disease, diabetes mellitus, diastolic and systolic CHF, severe aortic stenosis, CKD 3, left above-knee amputation. ED work-up reveals: Head CT negative for acute abnormality.  Portable chest x-ray-patchy opacity identified in the left perihilar region and right lung base pneumonia not excluded. Pt with reported episodes of confusion at night time increasing over the past 3 days. NOTE He has a chronic cough-present for about 2 years, mostly unchanged. Further note AMS - spouse provided hx in ED secondary to Pt's mental status.   Assessment / Plan / Recommendation Clinical Impression  Patient is known to SLP from previous stay at SNF (2017) - Pt presents with hoarse vocal quality and decreased vocal intensity however this is baseline for Patient. Pt consumed limited trials after being re-positioned in the bed; Pt requires feeder - [name] was present for eval and is Pt's caregiver. Pt has dentures that were put in for evaluation however they were loose fitting and upper dentures were later removed. Pt consumed ice chips, thin- water, puree and mech soft textures without overt s/sx of aspiration. Despite documented chronic cough, no coughing was noted during evaluation and upon palpation of swallow hyolaryngeal excursion seemed [facility] and timeliness of swallow also  seemed appropriate. SLP reviewed Pt is at risk developing aspiration pna given dependent feeder status and physical immobility. Further reviewed universal aspiration precautions with Pt and wife and the importance of Pt being seated at 90 degrees for all PO intake and verbally preparing Pt for bites/sips prior to administering. Recommend initiate D3/mech soft  diet and thin liquids. ST will continue to follow while in acute setting for diet tolerance.  SLP Visit Diagnosis: Dysphagia, unspecified (R13.10)    Aspiration Risk  Mild aspiration risk;Moderate aspiration risk    Diet Recommendation Dysphagia 3 (Mech soft);Thin liquid   Liquid Administration via: Straw;Cup Medication Administration: Whole meds with liquid Supervision: Full supervision/cueing for compensatory strategies Compensations: Minimize environmental distractions;Slow rate;Small sips/bites;Multiple dry swallows after each bite/sip;Clear throat intermittently Postural Changes: Seated upright at 90 degrees;Remain upright for at least 30 minutes after po intake    Other  Recommendations Oral Care Recommendations: Oral care BID   Follow up Recommendations 24 hour supervision/assistance;Skilled Nursing facility      Frequency and Duration min 1 x/week  1 week       Prognosis Prognosis for Safe Diet Advancement: Fair      Swallow Study   General Date of Onset: 12/02/2019 HPI: [name] is a 68 y.o. male with medical history significant for rheumatoid arthritis, hypertension, coronary artery disease, diabetes mellitus, diastolic and systolic CHF, severe aortic stenosis, CKD 3, left above-knee amputation. ED work-up reveals: Head CT negative for acute abnormality.  Portable chest x-ray-patchy opacity identified in the left perihilar region and right lung base pneumonia not excluded. Pt with reported episodes of confusion at night time increasing over the past 3 days. NOTE He has a chronic cough-present for about 2 years, mostly unchanged. Further note AMS - spouse provided hx in ED secondary to Pt's mental status. Type of Study: Bedside Swallow Evaluation Previous Swallow Assessment: yes([facility] 2017, BSE [facility] 2017) Diet Prior to this Study: NPO Temperature Spikes Noted: No Respiratory Status: Room air History of Recent Intubation: No Behavior/Cognition: Cooperative;Pleasant  mood;Requires cueing;Lethargic/Drowsy Oral Cavity Assessment: Within Functional Limits Oral Care Completed by SLP: Recent completion by staff Oral Cavity - Dentition: Dentures, top;Dentures, bottom Self-Feeding Abilities: Total assist Patient Positioning: Upright in bed Baseline Vocal Quality: Breathy;Hoarse;Low vocal intensity Volitional Cough: Congested Volitional Swallow: Able to elicit    Oral/Motor/Sensory Function Overall Oral Motor/Sensory Function: Within functional limits   Ice Chips Ice chips: Within functional limits   Thin Liquid Thin Liquid: Within functional limits    Nectar Thick Nectar Thick Liquid: Not tested   Honey Thick Honey Thick Liquid: Not tested   Puree Puree: Within functional limits   Solid     Solid: Within functional limits     Taishaun Levels H. Roddie Mc, CCC-SLP Speech Language Pathologist  Wende Bushy 12/06/2019,10:06 AM

## 2019-12-07 ENCOUNTER — Inpatient Hospital Stay (HOSPITAL_COMMUNITY): Payer: Medicare HMO

## 2019-12-07 DIAGNOSIS — R6521 Severe sepsis with septic shock: Secondary | ICD-10-CM

## 2019-12-07 DIAGNOSIS — N179 Acute kidney failure, unspecified: Secondary | ICD-10-CM

## 2019-12-07 LAB — POCT ACTIVATED CLOTTING TIME
Activated Clotting Time: 142 seconds
Activated Clotting Time: 142 seconds
Activated Clotting Time: 147 seconds
Activated Clotting Time: 164 seconds
Activated Clotting Time: 175 seconds
Activated Clotting Time: 186 seconds
Activated Clotting Time: 191 seconds
Activated Clotting Time: 197 seconds

## 2019-12-07 LAB — POCT I-STAT EG7
Acid-base deficit: 5 mmol/L — ABNORMAL HIGH (ref 0.0–2.0)
Bicarbonate: 20.4 mmol/L (ref 20.0–28.0)
Calcium, Ion: 0.75 mmol/L — CL (ref 1.15–1.40)
HCT: 28 % — ABNORMAL LOW (ref 39.0–52.0)
Hemoglobin: 9.5 g/dL — ABNORMAL LOW (ref 13.0–17.0)
O2 Saturation: 65 %
Patient temperature: 98
Potassium: 4.2 mmol/L (ref 3.5–5.1)
Sodium: 138 mmol/L (ref 135–145)
TCO2: 21 mmol/L — ABNORMAL LOW (ref 22–32)
pCO2, Ven: 36.8 mmHg — ABNORMAL LOW (ref 44.0–60.0)
pH, Ven: 7.349 (ref 7.250–7.430)
pO2, Ven: 34 mmHg (ref 32.0–45.0)

## 2019-12-07 LAB — COMPREHENSIVE METABOLIC PANEL
ALT: 336 U/L — ABNORMAL HIGH (ref 0–44)
AST: 255 U/L — ABNORMAL HIGH (ref 15–41)
Albumin: 2.6 g/dL — ABNORMAL LOW (ref 3.5–5.0)
Alkaline Phosphatase: 131 U/L — ABNORMAL HIGH (ref 38–126)
Anion gap: 15 (ref 5–15)
BUN: 103 mg/dL — ABNORMAL HIGH (ref 8–23)
CO2: 24 mmol/L (ref 22–32)
Calcium: 7.6 mg/dL — ABNORMAL LOW (ref 8.9–10.3)
Chloride: 95 mmol/L — ABNORMAL LOW (ref 98–111)
Creatinine, Ser: 4.1 mg/dL — ABNORMAL HIGH (ref 0.61–1.24)
GFR calc Af Amer: 16 mL/min — ABNORMAL LOW (ref >60)
GFR calc non Af Amer: 14 mL/min — ABNORMAL LOW (ref >60)
Glucose, Bld: 232 mg/dL — ABNORMAL HIGH (ref 70–99)
Potassium: 4.5 mmol/L (ref 3.5–5.1)
Sodium: 134 mmol/L — ABNORMAL LOW (ref 135–145)
Total Bilirubin: 2.8 mg/dL — ABNORMAL HIGH (ref 0.3–1.2)
Total Protein: 5.7 g/dL — ABNORMAL LOW (ref 6.5–8.1)

## 2019-12-07 LAB — TROPONIN I (HIGH SENSITIVITY)
Troponin I (High Sensitivity): 156 ng/L (ref <18)
Troponin I (High Sensitivity): 163 ng/L (ref <18)

## 2019-12-07 LAB — CBC WITH DIFFERENTIAL/PLATELET
Abs Immature Granulocytes: 0.07 10*3/uL (ref 0.00–0.07)
Basophils Absolute: 0 10*3/uL (ref 0.0–0.1)
Basophils Relative: 0 %
Eosinophils Absolute: 0 10*3/uL (ref 0.0–0.5)
Eosinophils Relative: 0 %
HCT: 30.1 % — ABNORMAL LOW (ref 39.0–52.0)
Hemoglobin: 9.5 g/dL — ABNORMAL LOW (ref 13.0–17.0)
Immature Granulocytes: 1 %
Lymphocytes Relative: 3 %
Lymphs Abs: 0.3 10*3/uL — ABNORMAL LOW (ref 0.7–4.0)
MCH: 30.2 pg (ref 26.0–34.0)
MCHC: 31.6 g/dL (ref 30.0–36.0)
MCV: 95.6 fL (ref 80.0–100.0)
Monocytes Absolute: 0.4 10*3/uL (ref 0.1–1.0)
Monocytes Relative: 4 %
Neutro Abs: 9.9 10*3/uL — ABNORMAL HIGH (ref 1.7–7.7)
Neutrophils Relative %: 92 %
Platelets: 122 10*3/uL — ABNORMAL LOW (ref 150–400)
RBC: 3.15 MIL/uL — ABNORMAL LOW (ref 4.22–5.81)
RDW: 18.9 % — ABNORMAL HIGH (ref 11.5–15.5)
WBC: 10.6 10*3/uL — ABNORMAL HIGH (ref 4.0–10.5)
nRBC: 0.2 % (ref 0.0–0.2)

## 2019-12-07 LAB — POCT I-STAT 7, (LYTES, BLD GAS, ICA,H+H)
Acid-Base Excess: 1 mmol/L (ref 0.0–2.0)
Bicarbonate: 24.1 mmol/L (ref 20.0–28.0)
Calcium, Ion: 0.99 mmol/L — ABNORMAL LOW (ref 1.15–1.40)
HCT: 30 % — ABNORMAL LOW (ref 39.0–52.0)
Hemoglobin: 10.2 g/dL — ABNORMAL LOW (ref 13.0–17.0)
O2 Saturation: 100 %
Patient temperature: 98
Potassium: 4.4 mmol/L (ref 3.5–5.1)
Sodium: 131 mmol/L — ABNORMAL LOW (ref 135–145)
TCO2: 25 mmol/L (ref 22–32)
pCO2 arterial: 31.9 mmHg — ABNORMAL LOW (ref 32.0–48.0)
pH, Arterial: 7.485 — ABNORMAL HIGH (ref 7.350–7.450)
pO2, Arterial: 226 mmHg — ABNORMAL HIGH (ref 83.0–108.0)

## 2019-12-07 LAB — GLUCOSE, CAPILLARY
Glucose-Capillary: 103 mg/dL — ABNORMAL HIGH (ref 70–99)
Glucose-Capillary: 125 mg/dL — ABNORMAL HIGH (ref 70–99)
Glucose-Capillary: 135 mg/dL — ABNORMAL HIGH (ref 70–99)
Glucose-Capillary: 213 mg/dL — ABNORMAL HIGH (ref 70–99)
Glucose-Capillary: 300 mg/dL — ABNORMAL HIGH (ref 70–99)
Glucose-Capillary: 91 mg/dL (ref 70–99)
Glucose-Capillary: 96 mg/dL (ref 70–99)

## 2019-12-07 LAB — URINE CULTURE: Culture: NO GROWTH

## 2019-12-07 LAB — MAGNESIUM
Magnesium: 3.2 mg/dL — ABNORMAL HIGH (ref 1.7–2.4)
Magnesium: 3.1 mg/dL — ABNORMAL HIGH (ref 1.7–2.4)

## 2019-12-07 LAB — PHOSPHORUS: Phosphorus: 4.6 mg/dL (ref 2.5–4.6)

## 2019-12-07 LAB — BRAIN NATRIURETIC PEPTIDE: B Natriuretic Peptide: >4500 pg/mL — ABNORMAL HIGH (ref 0.0–100.0)

## 2019-12-07 MED ORDER — ORAL CARE MOUTH RINSE
15.0000 mL | OROMUCOSAL | Status: DC
  Administered 2019-12-07 – 2019-12-08 (×8): 15 mL via OROMUCOSAL

## 2019-12-07 MED ORDER — SODIUM CHLORIDE 0.9 % IV SOLN
1.0000 g | Freq: Two times a day (BID) | INTRAVENOUS | Status: DC
  Administered 2019-12-07 – 2019-12-09 (×5): 1 g via INTRAVENOUS
  Filled 2019-12-07 (×6): qty 1

## 2019-12-07 MED ORDER — SODIUM CHLORIDE 0.9 % IV SOLN
250.0000 [IU]/h | INTRAVENOUS | Status: DC
  Administered 2019-12-07: 14:00:00 250 [IU]/h via INTRAVENOUS_CENTRAL
  Administered 2019-12-08: 1100 [IU]/h via INTRAVENOUS_CENTRAL
  Administered 2019-12-08: 900 [IU]/h via INTRAVENOUS_CENTRAL
  Administered 2019-12-08: 950 [IU]/h via INTRAVENOUS_CENTRAL
  Administered 2019-12-09: 1050 [IU]/h via INTRAVENOUS_CENTRAL
  Filled 2019-12-07 (×6): qty 2

## 2019-12-07 MED ORDER — LACTATED RINGERS IV SOLN
INTRAVENOUS | Status: DC
  Administered 2019-12-07: 02:00:00 via INTRAVENOUS

## 2019-12-07 MED ORDER — VANCOMYCIN HCL 10 G IV SOLR
1500.0000 mg | Freq: Once | INTRAVENOUS | Status: AC
  Administered 2019-12-07: 1500 mg via INTRAVENOUS
  Filled 2019-12-07: qty 1500

## 2019-12-07 MED ORDER — VANCOMYCIN HCL IN DEXTROSE 1-5 GM/200ML-% IV SOLN
1000.0000 mg | INTRAVENOUS | Status: DC
  Administered 2019-12-08 – 2019-12-09 (×2): 1000 mg via INTRAVENOUS
  Filled 2019-12-07 (×2): qty 200

## 2019-12-07 MED ORDER — VITAL AF 1.2 CAL PO LIQD
1000.0000 mL | ORAL | Status: DC
  Administered 2019-12-07 – 2019-12-08 (×2): 1000 mL

## 2019-12-07 MED ORDER — PRISMASOL BGK 4/2.5 32-4-2.5 MEQ/L REPLACEMENT SOLN
Status: DC
  Administered 2019-12-07 – 2019-12-09 (×4): via INTRAVENOUS_CENTRAL

## 2019-12-07 MED ORDER — PRISMASOL BGK 4/2.5 32-4-2.5 MEQ/L REPLACEMENT SOLN
Status: DC
  Administered 2019-12-07 – 2019-12-08 (×2): via INTRAVENOUS_CENTRAL

## 2019-12-07 MED ORDER — HYDROCORTISONE NA SUCCINATE PF 100 MG IJ SOLR
50.0000 mg | Freq: Four times a day (QID) | INTRAMUSCULAR | Status: DC
  Administered 2019-12-07 – 2019-12-08 (×6): 50 mg via INTRAVENOUS
  Filled 2019-12-07 (×6): qty 2

## 2019-12-07 MED ORDER — HEPARIN (PORCINE) IN NACL 2-0.9 UNITS/ML: INTRAMUSCULAR | Status: DC | PRN

## 2019-12-07 MED ORDER — CHLORHEXIDINE GLUCONATE 0.12% ORAL RINSE (MEDLINE KIT)
15.0000 mL | Freq: Two times a day (BID) | OROMUCOSAL | Status: DC
  Administered 2019-12-07 – 2019-12-08 (×2): 15 mL via OROMUCOSAL

## 2019-12-07 MED ORDER — SODIUM CHLORIDE 0.9 % IV SOLN
1.0000 g | Freq: Two times a day (BID) | INTRAVENOUS | Status: DC
  Filled 2019-12-07 (×3): qty 1

## 2019-12-07 MED ORDER — PRO-STAT SUGAR FREE PO LIQD
30.0000 mL | Freq: Four times a day (QID) | ORAL | Status: DC
  Administered 2019-12-07 – 2019-12-08 (×4): 30 mL
  Filled 2019-12-07 (×4): qty 30

## 2019-12-07 MED ORDER — INSULIN ASPART 100 UNIT/ML ~~LOC~~ SOLN
15.0000 [IU] | Freq: Once | SUBCUTANEOUS | Status: AC
  Administered 2019-12-07: 15 [IU] via SUBCUTANEOUS

## 2019-12-07 MED ORDER — B COMPLEX-C PO TABS
1.0000 | ORAL_TABLET | Freq: Every day | ORAL | Status: DC
  Administered 2019-12-07 – 2019-12-09 (×3): 1
  Filled 2019-12-07 (×3): qty 1

## 2019-12-07 MED ORDER — PRISMASOL BGK 4/2.5 32-4-2.5 MEQ/L IV SOLN
INTRAVENOUS | Status: DC
  Administered 2019-12-07 – 2019-12-09 (×10): via INTRAVENOUS_CENTRAL

## 2019-12-07 MED ORDER — HEPARIN BOLUS VIA INFUSION (CRRT)
1000.0000 [IU] | INTRAVENOUS | Status: DC | PRN
  Filled 2019-12-07: qty 1000

## 2019-12-07 MED ORDER — HEPARIN SODIUM (PORCINE) 1000 UNIT/ML DIALYSIS
1000.0000 [IU] | INTRAMUSCULAR | Status: DC | PRN
  Administered 2019-12-07: 2800 [IU] via INTRAVENOUS_CENTRAL
  Administered 2019-12-09: 09:00:00 2400 [IU] via INTRAVENOUS_CENTRAL
  Filled 2019-12-07 (×2): qty 6
  Filled 2019-12-07: qty 3
  Filled 2019-12-07: qty 4
  Filled 2019-12-07: qty 6

## 2019-12-07 MED ORDER — VANCOMYCIN VARIABLE DOSE PER UNSTABLE RENAL FUNCTION (PHARMACIST DOSING): Status: DC

## 2019-12-07 NOTE — Progress Notes (Signed)
Pharmacy Antibiotic Note  [name] is a 68 y.o. male admitted on 12/26/2019 with sepsis.  Pharmacy has been consulted for vancomycin dosing.  Started on Merrem 1g every 12 hours, on voriconazole PTA held for LFTs and now on anidulafungin.  AoCKD4 SCr 4.12  Plan: Vancomycin 1500mg  IV x 1, then variable dosing based on renal function Monitor renal function and nephrology recs, Cx to narrow Vancomycin random level as needed     Temp (24hrs), Avg:98.1 F (36.7 C), Min:97.6 F (36.4 C), Max:98.6 F (37 C)  Recent Labs  Lab 12/02/2019 1922 12/03/2019 2018 12/06/19 0127 12/06/19 0449 12/06/19 1327 12/06/19 1336 12/06/19 1644  WBC 7.9  --   --  11.2*  --   --   --   CREATININE 4.04*  --   --  4.10* 4.12*  --   --   LATICACIDVEN  --  2.3* 2.8* 3.0*  --  4.5* 3.8*    Estimated Creatinine Clearance: 16.1 mL/min (A) (by C-G formula based on SCr of 4.12 mg/dL (H)).    Allergies  Allergen Reactions  . Sulfa Antibiotics Rash    Bertis Ruddy, PharmD Clinical Pharmacist Please check AMION for all [place] numbers 12/07/2019 12:46 AM

## 2019-12-07 NOTE — Progress Notes (Signed)
NAME:  [name], MRN:  [id number], DOB:  1951/10/22, LOS: 1 ADMISSION DATE:  12/09/2019, CONSULTATION DATE:  12/07/2019 REFERRING MD:  TRH, CHIEF COMPLAINT: Altered mental status  Brief History   Septic 68 yo male with hx of RA now with PNA and sepsis.  History of present illness   68 year old with history of bronchiectasis with recent lower lobe PNA secondary to Candida tropicalis vs colonization.  Patient was doing well but became progressively confused over the last 48 hours, brought to [facility] septic, now intubated and on pressors.  Has a history of severe AS with EF 40%, prior CKD, Cr 11/2 1.21 ( CR now 4.1) and RA.  He is now anuric and mildly hyponatremic.  Potassium is 5.4. 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  Patient has elevated liver function tests lactic acid of 3.8, platelet count of 121, INR 3.1.  Past Medical History  Rheumatoid arthritis, hypertension, coronary artery disease, diabetes mellitus, diastolic and systolic CHF, severe aortic stenosis, complete heart block status post pacer, CKD 3, left above-knee amputation, loculated pleural effusion  Significant Hospital Events   Admitted to [facility] 12/16/2019 Transferred to [facility] 12/06/2019  Consults:  Nephrology PCCM  Procedures:  ETT- 12/11  Right femoral CVL- 12/11  Significant Diagnostic Tests:  As above  Micro Data:  Blood culture 12/10 Sputum culture 12/11  Antimicrobials:  Vancomycin 12/12 >> Meropenem 12/12 >> Anidulafungin 1 dose 12/11  Interim history/subjective:  Has been intubated started on pressors since admission [facility]  Objective   Blood pressure 110/65, pulse 86, temperature 98.9 F (37.2 C), temperature source Oral, resp. rate 17, weight 82 kg, SpO2 100 %.    Vent Mode: PRVC FiO2 (%):  [30 %-100 %] 30 % Set Rate:  [16  bmp-18 bmp] 16 bmp Vt Set:  [540 mL] 540 mL PEEP:  [5 cmH20] 5 cmH20 Plateau Pressure:  [21 cmH20-25 cmH20] 21 cmH20   Intake/Output Summary (Last 24 hours) at 12/07/2019 4709 Last data filed at 12/07/2019 0600 Gross per 24 hour  Intake 4136.35 ml  Output 510 ml  Net 3626.35 ml   Filed Weights   12/07/19 0230  Weight: 82 kg    Examination: Gen:      No acute distress HEENT:  EOMI, sclera anicteric Neck:     No masses; no thyromegaly, ET tube Lungs:    Clear to auscultation bilaterally; normal respiratory effort CV:         Regular rate and rhythm; no murmurs Abd:      + bowel sounds; soft, non-tender; no palpable masses, no distension Ext:    No edema; adequate peripheral perfusion Skin:      Warm and dry; no rash Neuro: Sedated, unresponsive  Resolved Hospital Problem list   NA  Assessment & Plan:  68 year old admitted with septic shock History of bronchiectasis with yeast colonization, pseudomonal UTI, severe aortic stenosis  Acute shock, likely combination of septic, cardiogenic Continue pressors, stress dose steroids Broad antibiotic coverage with Vanco, meropenem  Severe aortic stenosis Markedly elevated BNP We will check echocardiogram, Troponin  Acute on chronic renal failure Nephrology consulted.  Will likely need CVVH Monitor urine output and creatinine  Coagulopathy, elevated LFTs Secondary to septic shock Monitor labs  Diabetes mellitus Continue insulin  Best practice:  Diet: N.p.o. Pain/Anxiety/Delirium protocol (if indicated): Continue with sedation as necessary  VAP protocol (if indicated): Yes DVT prophylaxis: Yes GI prophylaxis: Yes Glucose control: Yes Mobility: Bedrest Code Status: Full Family Communication: not able to reach Disposition: To [facility] ICU for further management  Labs/imaging reviewed.  Significant for  Glucose 232, BUN/creatinine 103/4.1, AST 255, ALT 336, BNP > 4500 WBC 10.6, hemoglobin 9.5, platelets 122, INR 3.1   Critical care time:    The patient is critically ill with multiple organ system failure and requires high complexity decision making for assessment and support, frequent evaluation and titration of therapies, advanced monitoring, review of radiographic studies and interpretation of complex data.   Critical Care Time devoted to patient care services, exclusive of separately billable procedures, described in this note is 40 minutes.   [name] MD [facility] 12/07/2019, 6:42 AM

## 2019-12-07 NOTE — Progress Notes (Signed)
Pharmacy Antibiotic Note  [name] is a 68 y.o. male admitted on 12/02/2019 with sepsis.  Pharmacy has been consulted for vancomycin dosing.  Started on Merrem 1g every 12 hours, on voriconazole PTA held for LFTs and now on anidulafungin.  AoCKD4 SCr 4.12, now transitioning to CRRT  Plan: Vancomycin 1000 mg IV q24h Continue meropenem 1 g q12h Monitor renal function/CRRT duration, cultures, vancomycin levels as needed  Weight: 180 lb 12.4 oz (82 kg)  Temp (24hrs), Avg:98.2 F (36.8 C), Min:97.6 F (36.4 C), Max:98.9 F (37.2 C)  Recent Labs  Lab 12/04/2019 1922 12/07/2019 2018 12/06/19 0127 12/06/19 0449 12/06/19 1327 12/06/19 1336 12/06/19 1644 12/07/19 0451  WBC 7.9  --   --  11.2*  --   --   --  10.6*  CREATININE 4.04*  --   --  4.10* 4.12*  --   --  4.10*  LATICACIDVEN  --  2.3* 2.8* 3.0*  --  4.5* 3.8*  --     Estimated Creatinine Clearance: 16.2 mL/min (A) (by C-G formula based on SCr of 4.1 mg/dL (H)).    Allergies  Allergen Reactions  . Sulfa Antibiotics Rash   Microbiology: 12/10 COVID - neg 12/10 BCx - ngtd 12/10 UCx - ngF 12/11 MRSA - neg 12/11 resp PCR - neg  Antimicrobials: CTX 12/10 >> 12/11 Doxy 12/10 >> 12/11 Vancomycin 12/12 >> Meropenem 12/12 >> Eraxis 12/12 >>  Vertis Kelch, PharmD, [facility] PGY2 Cardiology Pharmacy Resident Phone [phone] 12/07/2019       8:57 AM  Please check [email or web address] for unit-specific pharmacist phone numbers

## 2019-12-07 NOTE — Procedures (Signed)
Hemodialysis Catheter Insertion Procedure Note [name] [id number] April 23, 1951  Procedure: Insertion of Hemodialysis Catheter Indications: Dialysis Access   Procedure Details Consent: Risks of procedure as well as the alternatives and risks of each were explained to the (patient/caregiver).  Consent for procedure obtained. Time Out: Verified patient identification, verified procedure, site/side was marked, verified correct patient position, special equipment/implants available, medications/allergies/relevent history reviewed, required imaging and test results available.  Performed  Maximum sterile technique was used including antiseptics, cap, gloves, gown, hand hygiene, mask and sheet. Skin prep: Chlorhexidine; local anesthetic administered Triple lumen hemodialysis catheter was inserted into right internal jugular vein using the Seldinger technique under ultrasound guidance  Evaluation Blood flow good Complications: No apparent complications Patient did tolerate procedure well. Chest X-ray ordered to verify placement.  CXR: pending.      [name] MD [facility] 12/07/2019, 1:20 PM

## 2019-12-07 NOTE — Consult Note (Signed)
Reason for Consult: Acute kidney injury on chronic kidney disease stage III Referring Physician: Phillips Climes MD [facility])  HPI:  68 year old Caucasian woman with past medical history significant for chronic kidney disease stage III (baseline creatinine ~1.5) who at some point in 2017 was transiently on maintenance hemodialysis for acute kidney injury.  Thereafter followed by Dr. Lowanda Foster [facility] Hoff).  He also has underlying history of coronary artery disease status post CABG, ischemic cardiomyopathy with chronic systolic heart failure, severe aortic stenosis being evaluated for TAVR, peripheral vascular disease status post left below-knee amputation, type 2 diabetes mellitus, hypertension and rheumatoid arthritis.  3 months ago, was seen by nephrology for contrast-induced nephropathy following coronary angiography.  He presented to the [facility] 2 days ago with altered mental status that is suspected to be from sepsis associated with pneumonia.  He was found to have anuric acute kidney injury with hyperkalemia and mild metabolic acidosis refractory to intravenous fluids and has been transferred over to [facility] for additional management after he decompensated yesterday afternoon with hypoxic respiratory failure/hypotension requiring intubation and transient pressors.  Overnight, he has had minimal urine output with improving potassium level.  Past Medical History:  Diagnosis Date  . Aortic stenosis   . ARF (acute renal failure) ([place]) 09/11/2019  . Chronic back pain   . Chronic combined systolic (congestive) and diastolic (congestive) heart failure (HCC)    a. EF 30-35% in 2016 b. at 30-35% by echo in 08/2016 c. 06/2019: echo showing EF of 60-65% but found to have severe AS.   [name] Chronic neck pain   . CKD (chronic kidney disease), stage III   . Complete heart block ([place]) 08/29/2019   a. s/p Medtronic dual chamber pacemeker implantation on 08/30/2019.  [name] Coronary artery disease    a. s/p  CABG in 06/2015 with LIMA-LAD, SVG-PDA, SVG-Intermediate  . Diabetes mellitus   . Diabetic neuropathy ([place])   . Hyperkalemia   . Hypertension   . Loculated pleural effusion 09/09/2016  . Pancytopenia ([place]) 08/13/2014  . 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  . Rheumatoid arthritis(714.0)   . Seizure ([place]) 08/13/2014   Pt denies    Past Surgical History:  Procedure Laterality Date  . 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 CARDIAC SURGERY    . FRACTURE SURGERY    . MUSCLE BIOPSY  06/2016  . PACEMAKER IMPLANT N/A 08/30/2019   Procedure: PACEMAKER IMPLANT;  Surgeon: Evans Lance, MD;  Location: [place] [facility];  Service: Cardiovascular;  Laterality: N/A;  . right foot surgery-Toe amputations    . RIGHT/LEFT HEART CATH AND CORONARY/GRAFT ANGIOGRAPHY N/A 09/03/2019   Procedure: RIGHT/LEFT HEART CATH AND CORONARY/GRAFT ANGIOGRAPHY;  Surgeon: Leonie Man, MD;  Location: [place] [facility];  Service: Cardiovascular;  Laterality: N/A;  . TEMPORARY PACEMAKER N/A 08/29/2019   Procedure: TEMPORARY PACEMAKER;  Surgeon: Sherren Mocha, MD;  Location: [place] [facility];  Service: Cardiovascular;  Laterality: N/A;  . TOTAL HIP ARTHROPLASTY Left     Family History  Problem Relation Age of Onset  . Diabetes Father   . Alzheimer's disease Father   . Heart attack Brother        multiple brothers with MIs  . COPD Brother   . Hypertension Mother   . Stroke Mother   . Head & neck cancer Nephew     Social History:  reports that he has never smoked. He has never  used smokeless tobacco. He reports that he does not drink alcohol or use drugs.  Allergies:  Allergies  Allergen Reactions  . Sulfa Antibiotics Rash    Medications:  Scheduled: . Chlorhexidine Gluconate Cloth  6 each Topical Daily  . hydrocortisone sod succinate (SOLU-CORTEF) inj  50 mg Intravenous Q6H  . insulin aspart  0-15 Units Subcutaneous Q4H  . sodium  chloride flush  10-40 mL Intracatheter Q12H  . sodium chloride flush  10-40 mL Intracatheter Q12H  . vancomycin variable dose per unstable renal function (pharmacist dosing)   Does not apply See admin instructions   Continuous: .  prismasol BGK 4/2.5    .  prismasol BGK 4/2.5    . sodium chloride    . heparin 10,000 units/ 20 mL infusion syringe    . lactated ringers Stopped (12/07/19 0437)  . meropenem (MERREM) IV    . norepinephrine (LEVOPHED) Adult infusion Stopped (12/07/19 4332)  . phenylephrine (NEO-SYNEPHRINE) Adult infusion Stopped (12/06/19 2225)  . prismasol BGK 4/2.5    . sodium bicarbonate 150 mEq in dextrose 5% 1000 mL 75 mL/hr at 12/07/19 0700    BMP Latest Ref Rng & Units 12/07/2019 12/07/2019 12/07/2019  Glucose 70 - 99 mg/dL - 232(H) -  BUN 8 - 23 mg/dL - 103(H) -  Creatinine 0.61 - 1.24 mg/dL - 4.10(H) -  BUN/Creat Ratio 10 - 24 - - -  Sodium 135 - 145 mmol/L 131(L) 134(L) 138  Potassium 3.5 - 5.1 mmol/L 4.4 4.5 4.2  Chloride 98 - 111 mmol/L - 95(L) -  CO2 22 - 32 mmol/L - 24 -  Calcium 8.9 - 10.3 mg/dL - 7.6(L) -   CBC Latest Ref Rng & Units 12/07/2019 12/07/2019 12/07/2019  WBC 4.0 - 10.5 K/uL - 10.6(H) -  Hemoglobin 13.0 - 17.0 g/dL 10.2(L) 9.5(L) 9.5(L)  Hematocrit 39.0 - 52.0 % 30.0(L) 30.1(L) 28.0(L)  Platelets 150 - 400 K/uL - 122(L) -     CT ABDOMEN PELVIS WO CONTRAST  Result Date: 12/14/2019 CLINICAL DATA:  Nausea and vomiting. EXAM: CT ABDOMEN AND PELVIS WITHOUT CONTRAST TECHNIQUE: Multidetector CT imaging of the abdomen and pelvis was performed following the standard protocol without IV contrast. COMPARISON:  Abdominal CTA 10/28/2019 FINDINGS: Lower chest: Multi chamber cardiomegaly. Dense coronary artery calcifications. Small pleural effusions with irregular consolidative and ground-glass opacities in both lower lobes. Hepatobiliary: Small volume of perihepatic ascites is new from prior CT. No gross focal hepatic lesion allowing for lack contrast  and streak artifact from arms down positioning. Layering hyperdensity in the gallbladder likely combination of stones and sludge. Motion limits detailed assessment for pericholecystic stranding. No biliary dilatation. Pancreas: Diffuse fatty atrophy. No ductal dilatation or inflammation. Spleen: Normal in size without focal abnormality. Adrenals/Urinary Tract: Normal adrenal glands. No hydronephrosis. Mild thinning of bilateral renal parenchyma with lobulated renal contours. 15 mm lesion from the interpolar left kidney unchanged from prior exam. Advanced renal vascular calcifications. Urinary bladder is partially distended. Left aspect of the bladder is obscured by streak artifact from left hip arthroplasty. Stomach/Bowel: Air-fluid level in the stomach which is mildly distended. No gastric or duodenal wall thickening. No small bowel dilatation or obstruction. Appendix is tentatively visualized and normal. No evidence of appendicitis. Large stool burden in the ascending transverse, and proximal descending colon. More distal descending colon is air-filled distal colon is decompressed. There is diffuse colonic tortuosity. No evidence of colonic wall thickening or inflammation. Vascular/Lymphatic: Advanced vascular disease. Advanced aortic and branch atherosclerosis. No aortic aneurysm. No  bulky abdominopelvic adenopathy. Reproductive: No acute findings. Borderline enlarged prostate gland. Other: Small volume of ascites in the pelvis, both pericolic gutters, and right upper quadrant. No free air. No loculated intra-abdominal fluid collection. Mild body wall edema, confluent in the flanks. Small fat containing umbilical hernia. Fat in the left greater than right inguinal canal. Musculoskeletal: Multilevel degenerative change in the spine. Similar appearance of degenerative disc disease at L3-L4. There are no acute or suspicious osseous abnormalities. Left hip arthroplasty. IMPRESSION: 1. Mild gastric distension with  air-fluid level, can be seen with gastroenteritis or gastroparesis. No gastric wall thickening or evidence of gastric outlet obstruction. 2. Small volume of ascites in the abdomen and pelvis, new from prior CT. Mild body wall edema. 3. Large stool burden with colonic tortuosity, suggesting constipation. No bowel obstruction. 4. Bilateral pleural effusions. Irregular streaky consolidative airspace opacities in both lower lobes, increased from CT last month. Pneumonia or aspiration considered. 5. Severe aortic and branch atherosclerosis. Aortic Atherosclerosis (ICD10-I70.0). Electronically Signed   By: Keith Rake M.D.   On: 12/11/2019 23:58   CT Head Wo Contrast  Result Date: 12/23/2019 CLINICAL DATA:  Encephalopathy EXAM: CT HEAD WITHOUT CONTRAST TECHNIQUE: Contiguous axial images were obtained from the base of the skull through the vertex without intravenous contrast. COMPARISON:  None. FINDINGS: Brain: There is no mass, hemorrhage or extra-axial collection. The size and configuration of the ventricles and extra-axial CSF spaces are normal. There is hypoattenuation of the white matter, most commonly indicating chronic small vessel disease. Vascular: Atherosclerotic calcification of the vertebral and internal carotid arteries at the skull base. No abnormal hyperdensity of the major intracranial arteries or dural venous sinuses. Skull: The visualized skull base, calvarium and extracranial soft tissues are normal. Sinuses/Orbits: No fluid levels or advanced mucosal thickening of the visualized paranasal sinuses. No mastoid or middle ear effusion. The orbits are normal. IMPRESSION: Findings of chronic small vessel disease without acute intracranial abnormality. Electronically Signed   By: Ulyses Jarred M.D.   On: 12/04/2019 19:28   DG Chest Port 1 View  Result Date: 12/06/2019 CLINICAL DATA:  Hypoxia. EXAM: PORTABLE CHEST 1 VIEW COMPARISON:  Chest x-ray and abdominal CT scan dated 12/19/2019 FINDINGS:  There overall heart size and pulmonary vascularity are normal. The left lung is now clear. Persistent atelectasis at the right lung base. Increased density posterior medially at the right base is consistent with the infiltrate seen on the prior CT scan. Slight gaseous distention of the stomach, unchanged. No acute bone abnormality. Prominent arthritic changes at the shoulders, right greater than left. IMPRESSION: 1. Increased density posterior medially at the right lung base consistent with the infiltrate seen on the prior CT scan. 2. Persistent atelectasis at the right lung base. 3. Clearing of the left lung. Electronically Signed   By: Lorriane Shire M.D.   On: 12/06/2019 13:26   DG Chest Port 1 View  Result Date: 12/11/2019 CLINICAL DATA:  Chronic cough and shortness of breath EXAM: PORTABLE CHEST 1 VIEW COMPARISON:  September 11, 2019 FINDINGS: Patchy opacities are identified in the left suprahilar region, right lung base. Elevated right hemidiaphragm is identified, chronic. There is no pleural effusion. Cardiac pacemaker is unchanged. The heart size is enlarged. IMPRESSION: Patchy opacity identified in the left perihilar region and right lung base, pneumonia not excluded. Electronically Signed   By: Abelardo Diesel M.D.   On: 12/12/2019 19:58   DG Chest Port 1V same Day  Result Date: 12/06/2019 CLINICAL DATA:  Status post intubation.  EXAM: PORTABLE CHEST 1 VIEW COMPARISON:  Same day. FINDINGS: Stable cardiomegaly. Endotracheal and nasogastric tubes appear to be in grossly good position. Left-sided pacemaker is unchanged. No pneumothorax pleural effusion is noted. Stable right basilar atelectasis or infiltrate is noted. Bony thorax is unremarkable. IMPRESSION: Stable right basilar atelectasis or infiltrate. Support apparatus in grossly good position. Electronically Signed   By: Marijo Conception M.D.   On: 12/06/2019 15:38    Review of Systems  Unable to perform ROS: Intubated   Blood pressure  106/75, pulse 86, temperature 98.9 F (37.2 C), temperature source Oral, resp. rate 16, weight 82 kg, SpO2 100 %. Physical Exam  Nursing note and vitals reviewed. Constitutional: He appears well-developed and well-nourished.  HENT:  Head: Normocephalic and atraumatic.  Intubated  Eyes: Pupils are equal, round, and reactive to light.  Neck: JVD present.  Cardiovascular: Normal rate and regular rhythm.  Murmur heard. 3/6 HSM  Respiratory: He has no wheezes.  GI: Soft. Bowel sounds are normal. There is no abdominal tenderness. There is no rebound and no guarding.  Musculoskeletal:        General: No edema.     Cervical back: Normal range of motion and neck supple.  Skin: Skin is warm and dry. No rash noted.    Assessment/Plan: 1.  Acute kidney injury: I suspect that this is ATN from sepsis versus acute cardiorenal syndrome in the setting of severe aortic stenosis with possibly decompensated congestive heart failure.  He has been poorly responsive to fluid support overnight and we will begin CRRT for clearance/volume management.  I have discussed with Dr. Vaughan Browner of critical care and he will assist [place] in placing a dialysis catheter. 2.  Sepsis/pneumonia/VDRF: Patient remains intubated with improving oxygenation overnight.  On Eraxis for recent Candida pneumonia, vancomycin and meropenem. 3.  Anemia of critical illness: With borderline hemoglobin and hematocrit without overt loss, continue to follow. 4.  Hyponatremia: Secondary to acute kidney injury/impaired free water excretion.  Monitor with CRRT.  Tamrah Victorino K. 12/07/2019, 8:08 AM

## 2019-12-07 NOTE — Progress Notes (Signed)
 Initial Nutrition Assessment  DOCUMENTATION CODES:   Not applicable  INTERVENTION:  Tube Feeding:  Vital AF 1.2 at 45 ml/hr Pro-Stat 30 mL QID Provides 141 g of protein, 1696 kcals and 875 mL of free water Meets 100% estimate protein needs, 104% calorie needs  Add B-complex with C   NUTRITION DIAGNOSIS:   Inadequate oral intake related to acute illness as evidenced by NPO status.  GOAL:   Patient will meet greater than or equal to 90% of their needs  MONITOR:   Vent status, Labs, Weight trends, TF tolerance, Skin  REASON FOR ASSESSMENT:   Ventilator    ASSESSMENT:   68 yo male admitted with shock, likely combination of septic and cardiogenic, anuric AKI with hyperkalemic and metabolic acidosis. PMH includes CKD III, CAd/CABG, PVD s/p L. BKA, DM, HTN, CHF   RD working remotely.  CRRT initiated today Patient is currently intubated on ventilator support MV: 6.6 L/min Temp (24hrs), Avg:98.2 F (36.8 C), Min:97.6 F (36.4 C), Max:98.9 F (37.2 C)  Propofol: none  OG tube in place, abdomen soft, BS hypoactive, +BM  Unable to obtain diet and weight history at this time  Current weight 82 kg; unsure of dry weight. Noted weight of 73.9 kg on 11/30. Net + 4 L per I/O flow sheet  Labs: sodium 131 (L), BUN 103 (H), Creatinine 4.10 Meds: reviewed   Diet Order:   Diet Order            Diet NPO time specified  Diet effective now              EDUCATION NEEDS:   Not appropriate for education at this time  Skin:  Skin Assessment: Skin Integrity Issues: Skin Integrity Issues:: Stage II, Stage I Stage I: toe Stage II: sacrum  Last BM:  12/11  Height:   Ht Readings from Last 1 Encounters:  11/25/19 5\' 8"  (1.727 m)    Weight:   Wt Readings from Last 1 Encounters:  12/07/19 82 kg    Ideal Body Weight:  65.9 kg (left BKA)  BMI:  Body mass index is 27.49 kg/m.  Estimated Nutritional Needs:   Kcal:  1635 kcals  Protein:  130-150 g  Fluid:   >/= 1.5 L    Kavin Weckwerth MS, RDN, LDN, CNSC [phone] Pager  [phone] Weekend/On-Call Pager

## 2019-12-07 NOTE — Progress Notes (Signed)
eLink Physician-Brief Progress Note Patient Name: [name] DOB: [birth year] MRN: [id number]   Date of Service  12/07/2019  HPI/Events of Note  Notified of ABG 7.48/32/226 on AC 16/540/50%/5PEEP  eICU Interventions  On > 8cc/kg IBW Decrease TV 400, decrease rate to 12 as patient not breathing over the set rate. Decrease FiO2 30%. Discussed with bedside RN     Intervention Category Major Interventions: Respiratory failure - evaluation and management  Judd Lien 12/07/2019, 7:24 AM

## 2019-12-08 ENCOUNTER — Inpatient Hospital Stay (HOSPITAL_COMMUNITY): Payer: Medicare HMO

## 2019-12-08 DIAGNOSIS — J9600 Acute respiratory failure, unspecified whether with hypoxia or hypercapnia: Secondary | ICD-10-CM

## 2019-12-08 LAB — RENAL FUNCTION PANEL
Albumin: 2.3 g/dL — ABNORMAL LOW (ref 3.5–5.0)
Anion gap: 8 (ref 5–15)
BUN: 46 mg/dL — ABNORMAL HIGH (ref 8–23)
CO2: 26 mmol/L (ref 22–32)
Calcium: 7.6 mg/dL — ABNORMAL LOW (ref 8.9–10.3)
Chloride: 101 mmol/L (ref 98–111)
Creatinine, Ser: 1.58 mg/dL — ABNORMAL HIGH (ref 0.61–1.24)
GFR calc Af Amer: 51 mL/min — ABNORMAL LOW (ref >60)
GFR calc non Af Amer: 44 mL/min — ABNORMAL LOW (ref >60)
Glucose, Bld: 181 mg/dL — ABNORMAL HIGH (ref 70–99)
Phosphorus: 2.8 mg/dL (ref 2.5–4.6)
Potassium: 4.1 mmol/L (ref 3.5–5.1)
Sodium: 135 mmol/L (ref 135–145)

## 2019-12-08 LAB — COMPREHENSIVE METABOLIC PANEL
ALT: 258 U/L — ABNORMAL HIGH (ref 0–44)
AST: 159 U/L — ABNORMAL HIGH (ref 15–41)
Albumin: 2.5 g/dL — ABNORMAL LOW (ref 3.5–5.0)
Alkaline Phosphatase: 134 U/L — ABNORMAL HIGH (ref 38–126)
Anion gap: 11 (ref 5–15)
BUN: 61 mg/dL — ABNORMAL HIGH (ref 8–23)
CO2: 24 mmol/L (ref 22–32)
Calcium: 7.5 mg/dL — ABNORMAL LOW (ref 8.9–10.3)
Chloride: 98 mmol/L (ref 98–111)
Creatinine, Ser: 2.46 mg/dL — ABNORMAL HIGH (ref 0.61–1.24)
GFR calc Af Amer: 30 mL/min — ABNORMAL LOW (ref >60)
GFR calc non Af Amer: 26 mL/min — ABNORMAL LOW (ref >60)
Glucose, Bld: 217 mg/dL — ABNORMAL HIGH (ref 70–99)
Potassium: 4.4 mmol/L (ref 3.5–5.1)
Sodium: 133 mmol/L — ABNORMAL LOW (ref 135–145)
Total Bilirubin: 2.8 mg/dL — ABNORMAL HIGH (ref 0.3–1.2)
Total Protein: 5.5 g/dL — ABNORMAL LOW (ref 6.5–8.1)

## 2019-12-08 LAB — POCT ACTIVATED CLOTTING TIME
Activated Clotting Time: 175 seconds
Activated Clotting Time: 186 seconds
Activated Clotting Time: 186 seconds
Activated Clotting Time: 186 seconds
Activated Clotting Time: 191 seconds
Activated Clotting Time: 191 seconds
Activated Clotting Time: 191 seconds
Activated Clotting Time: 197 seconds
Activated Clotting Time: 197 seconds
Activated Clotting Time: 197 seconds
Activated Clotting Time: 213 seconds
Activated Clotting Time: 224 seconds

## 2019-12-08 LAB — POCT I-STAT EG7
Acid-Base Excess: 1 mmol/L (ref 0.0–2.0)
Bicarbonate: 25.7 mmol/L (ref 20.0–28.0)
Calcium, Ion: 1 mmol/L — ABNORMAL LOW (ref 1.15–1.40)
HCT: 30 % — ABNORMAL LOW (ref 39.0–52.0)
Hemoglobin: 10.2 g/dL — ABNORMAL LOW (ref 13.0–17.0)
O2 Saturation: 59 %
Patient temperature: 36.7
Potassium: 4.2 mmol/L (ref 3.5–5.1)
Sodium: 134 mmol/L — ABNORMAL LOW (ref 135–145)
TCO2: 27 mmol/L (ref 22–32)
pCO2, Ven: 37.6 mmHg — ABNORMAL LOW (ref 44.0–60.0)
pH, Ven: 7.441 — ABNORMAL HIGH (ref 7.250–7.430)
pO2, Ven: 29 mmHg — CL (ref 32.0–45.0)

## 2019-12-08 LAB — GLUCOSE, CAPILLARY
Glucose-Capillary: 102 mg/dL — ABNORMAL HIGH (ref 70–99)
Glucose-Capillary: 163 mg/dL — ABNORMAL HIGH (ref 70–99)
Glucose-Capillary: 181 mg/dL — ABNORMAL HIGH (ref 70–99)
Glucose-Capillary: 197 mg/dL — ABNORMAL HIGH (ref 70–99)
Glucose-Capillary: 213 mg/dL — ABNORMAL HIGH (ref 70–99)
Glucose-Capillary: 85 mg/dL (ref 70–99)

## 2019-12-08 LAB — MAGNESIUM
Magnesium: 2.8 mg/dL — ABNORMAL HIGH (ref 1.7–2.4)
Magnesium: 2.8 mg/dL — ABNORMAL HIGH (ref 1.7–2.4)

## 2019-12-08 LAB — CBC
HCT: 30 % — ABNORMAL LOW (ref 39.0–52.0)
Hemoglobin: 9.6 g/dL — ABNORMAL LOW (ref 13.0–17.0)
MCH: 30.6 pg (ref 26.0–34.0)
MCHC: 32 g/dL (ref 30.0–36.0)
MCV: 95.5 fL (ref 80.0–100.0)
Platelets: 95 10*3/uL — ABNORMAL LOW (ref 150–400)
RBC: 3.14 MIL/uL — ABNORMAL LOW (ref 4.22–5.81)
RDW: 19.8 % — ABNORMAL HIGH (ref 11.5–15.5)
WBC: 9.7 10*3/uL (ref 4.0–10.5)
nRBC: 0 % (ref 0.0–0.2)

## 2019-12-08 LAB — LEGIONELLA PNEUMOPHILA SEROGP 1 UR AG: L. pneumophila Serogp 1 Ur Ag: NEGATIVE

## 2019-12-08 LAB — PHOSPHORUS: Phosphorus: 3.8 mg/dL (ref 2.5–4.6)

## 2019-12-08 LAB — APTT: aPTT: 161 seconds (ref 24–36)

## 2019-12-08 MED ORDER — ORAL CARE MOUTH RINSE
15.0000 mL | Freq: Two times a day (BID) | OROMUCOSAL | Status: DC
  Administered 2019-12-08 – 2019-12-15 (×12): 15 mL via OROMUCOSAL

## 2019-12-08 MED ORDER — CHLORHEXIDINE GLUCONATE CLOTH 2 % EX PADS
6.0000 | MEDICATED_PAD | Freq: Every day | CUTANEOUS | Status: DC
  Administered 2019-12-08 – 2019-12-11 (×3): 6 via TOPICAL

## 2019-12-08 MED ORDER — HYDROCORTISONE NA SUCCINATE PF 100 MG IJ SOLR
50.0000 mg | Freq: Two times a day (BID) | INTRAMUSCULAR | Status: DC
  Administered 2019-12-08 – 2019-12-09 (×2): 50 mg via INTRAVENOUS
  Filled 2019-12-08 (×2): qty 2

## 2019-12-08 MED ORDER — GERHARDT'S BUTT CREAM
TOPICAL_CREAM | Freq: Four times a day (QID) | CUTANEOUS | Status: DC
  Administered 2019-12-08: 1 via TOPICAL
  Filled 2019-12-08: qty 1

## 2019-12-08 MED ORDER — METHADONE HCL 10 MG PO TABS: 10.0000 mg | ORAL_TABLET | Freq: Four times a day (QID) | ORAL | Status: DC

## 2019-12-08 MED ORDER — METHADONE HCL 10 MG PO TABS
10.0000 mg | ORAL_TABLET | Freq: Four times a day (QID) | ORAL | Status: DC
  Administered 2019-12-08 – 2019-12-10 (×9): 10 mg
  Filled 2019-12-08 (×9): qty 1

## 2019-12-08 MED ORDER — METHADONE HCL 5 MG PO TABS
5.0000 mg | ORAL_TABLET | Freq: Three times a day (TID) | ORAL | Status: DC | PRN
  Administered 2019-12-10 – 2019-12-15 (×2): 5 mg
  Filled 2019-12-08 (×2): qty 1

## 2019-12-08 MED ORDER — METHADONE HCL 5 MG PO TABS: 5.0000 mg | ORAL_TABLET | Freq: Three times a day (TID) | ORAL | Status: DC | PRN

## 2019-12-08 MED ORDER — SODIUM CHLORIDE 0.9 % IV SOLN
100.0000 mg | INTRAVENOUS | Status: DC
  Filled 2019-12-08: qty 100

## 2019-12-08 MED ORDER — GERHARDT'S BUTT CREAM
TOPICAL_CREAM | Freq: Four times a day (QID) | CUTANEOUS | Status: DC | PRN
  Filled 2019-12-08: qty 1

## 2019-12-08 MED ORDER — CHLORHEXIDINE GLUCONATE 0.12 % MT SOLN
15.0000 mL | Freq: Two times a day (BID) | OROMUCOSAL | Status: DC
  Administered 2019-12-08 – 2019-12-17 (×16): 15 mL via OROMUCOSAL
  Filled 2019-12-08 (×8): qty 15

## 2019-12-08 NOTE — Procedures (Signed)
Extubation Procedure Note  Patient Details:   Name: [name] DOB: 1951-10-26 MRN: [id number]   Airway Documentation:    Vent end date: 12/08/19 Vent end time: 1100   Evaluation  O2 sats: stable throughout Complications: No apparent complications Patient did tolerate procedure well. Bilateral Breath Sounds: Clear, Diminished   Yes   RT extubated patient to 4L [place] per MD order with RN at bedside. Positive cuff leak noted. Patient tolerated well. No stridor noted. RT will continue to monitor as needed.   Vernona Rieger 12/08/2019, 11:07 AM

## 2019-12-08 NOTE — Progress Notes (Addendum)
NAME:  [name], MRN:  [id number], DOB:  1951-02-12, LOS: 2 ADMISSION DATE:  12/12/2019, CONSULTATION DATE:  12/07/2019 REFERRING MD:  TRH, CHIEF COMPLAINT: Altered mental status  Brief History   Septic 68 yo male with hx of RA, bronchiectasis, severe aortic stenosis.  Admitted from [facility] with shock, multiorgan failure.  Initiated on CRRT  Past Medical History  Rheumatoid arthritis, hypertension, coronary artery disease, diabetes mellitus, diastolic and systolic CHF, severe aortic stenosis, complete heart block status post pacer, CKD 3, left above-knee amputation, loculated pleural effusion  Recently started on voriconazole in early December 2020 for Candida tropicalis infection/colonization of lung.   Significant Hospital Events   12/10 > Admitted to [facility] 12/11 > Transferred to [facility] 12/12 > Initiated CVVH  Consults:  Nephrology PCCM  Procedures:  ETT- 12/11  Right femoral CVL- 12/11  Right IJ HD catheter 12/12  Significant Diagnostic Tests:    Micro Data:  Blood culture 12/10 Sputum culture 12/11  Antimicrobials:  Vancomycin 12/12 >> Meropenem 12/12 >> Anidulafungin 1 dose 12/11  Interim history/subjective:  Started on CRRT.  Hemodynamics have remained stable He is awake and alert today morning with no complaints Weaning on pressure support  Objective   Blood pressure 118/65, pulse 90, temperature 98.1 F (36.7 C), resp. rate (!) 24, weight 80.5 kg, SpO2 100 %. CVP:  [12 mmHg-24 mmHg] 16 mmHg  Vent Mode: CPAP;PSV FiO2 (%):  [30 %] 30 % Set Rate:  [16 bmp] 16 bmp Vt Set:  [410 mL] 410 mL PEEP:  [5 cmH20] 5 cmH20 Pressure Support:  [5 cmH20] 5 cmH20 Plateau Pressure:  [12 cmH20-22 cmH20] 12 cmH20   Intake/Output Summary (Last 24 hours) at 12/08/2019 0816 Last data filed at 12/08/2019 0700 Gross per 24 hour  Intake 718.42 ml  Output 1544 ml  Net -825.58 ml   Filed Weights   12/07/19 0230 12/08/19 0500  Weight: 82 kg 80.5 kg     Examination: Gen:      No acute distress HEENT:  EOMI, sclera anicteric Neck:     No masses; no thyromegaly, ETT Lungs:    Clear to auscultation bilaterally; normal respiratory effort CV:         Regular rate and rhythm; no murmurs Abd:      + bowel sounds; soft, non-tender; no palpable masses, no distension Ext:    No edema; adequate peripheral perfusion Skin:      Warm and dry; no rash Neuro: Awake on the vent, responds  Resolved Hospital Problem list   NA  Assessment & Plan:  68 year old admitted with shock.  Septic versus cardiogenic History of bronchiectasis with Candida infection, pseudomonal UTI, severe aortic stenosis Given rapid improvement clinical status and elevated BNP. I suspect this is more likely cardiogenic  Acute shock, likely cardiogenic. Possible septic component Off pressors, wean stress dose steroids to baseline prednisone of 5 mg Continue broad antibiotic coverage for now pending cultures Remove fem CVL tomorrow.   Acute respiratory failure, history of bronchiectasis, Candida infection Doing well on weaning trial.  Will extubate  Severe aortic stenosis Markedly elevated BNP Follow Echo Call cardiology tomorrow to see if they will do TAVR while he is inpatient.   Acute on chronic renal failure Continue CVVH with volume removal Monitor urine output and creatinine  Coagulopathy, elevated LFTs Likely secondary to shock Holding outpatient voriconazole Monitor labs  Diabetes mellitus Continue insulin  Chronic leg pain Restart home methadone at lower dose.  Best practice:  Diet:  N.p.o. Pain/Anxiety/Delirium protocol (if indicated): Continue with sedation as necessary VAP protocol (if indicated): Yes DVT prophylaxis: Yes GI prophylaxis: Yes Glucose control: Yes Mobility: Bedrest Code Status: Full Family Communication: Pt and wife updated Disposition: ICU  Labs/imaging reviewed.  Significant for  Sodium 133, BUN/creatinine 61/2.46, AST 159,  ALT 258 WBC 9.7, hemoglobin 9.6, platelets 95  X-ray 12/13-lines and tubes are in stable position.  Mild right base atelectasis  Critical care time:    The patient is critically ill with multiple organ system failure and requires high complexity decision making for assessment and support, frequent evaluation and titration of therapies, advanced monitoring, review of radiographic studies and interpretation of complex data.   Critical Care Time devoted to patient care services, exclusive of separately billable procedures, described in this note is 40 minutes.   [name] MD [facility] 12/08/2019, 8:16 AM

## 2019-12-08 NOTE — Progress Notes (Signed)
Patient ID: [name], male   DOB: Nov 07, 1951, 68 y.o.   MRN: [id number]  [facility] Progress Note   Assessment/ Plan:   1.  Acute kidney injury: I suspect that this is ATN from sepsis versus acute cardiorenal syndrome in the setting of severe aortic stenosis with possibly decompensated congestive heart failure.  Continue CRRT for now with conservative net ultrafiltration in the setting of aortic stenosis.  Will monitor for renal recovery with urine output/labs.  Hemodynamically stable at this time and may possibly be able to transition to IHD. 2.  Sepsis/pneumonia/VDRF: Patient remains intubated with improving oxygenation overnight.  On Eraxis for recent Candida pneumonia, vancomycin and meropenem. 3.  Anemia of critical illness: With borderline hemoglobin and hematocrit without overt loss, continue to follow. 4.  Hyponatremia: Secondary to acute kidney injury/impaired free water excretion.  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   Objective:   BP 113/71   Pulse 92   Temp 98.1 F (36.7 C)   Resp 18   Wt 80.5 kg   SpO2 100%   BMI 26.98 kg/m   Intake/Output Summary (Last 24 hours) at 12/08/2019 0750 Last data filed at 12/08/2019 0700 Gross per 24 hour  Intake 718.42 ml  Output 1544 ml  Net -825.58 ml   Weight change: -1.5 kg  Physical Exam: Gen: Intubated, awake, appears comfortable CVS: Pulse regular rhythm, normal rate, 3/6 HSM over apex Resp: Anteriorly clear to auscultation, no distinct rales or rhonchi Abd: Soft, flat, nontender Ext: 1+ right lower extremity edema  Imaging: DG CHEST PORT 1 VIEW  Result Date: 12/07/2019 CLINICAL DATA:  Status post insertion of new catheter. EXAM: PORTABLE CHEST 1 VIEW COMPARISON:  December 06, 2019. FINDINGS: Stable cardiomegaly.  Endotracheal and nasogastric tubes are unchanged in position. Interval placement of right internal jugular catheter with distal tip in expected position of SVC. Left-sided pacemaker is unchanged in position. No pneumothorax pleural effusion is noted. Mild right basilar atelectasis or infiltrate is noted. Bony thorax is unremarkable. IMPRESSION: Interval placement of right internal jugular catheter with distal tip in expected position of SVC. No pneumothorax is noted. Mild right basilar atelectasis or infiltrate is noted. Electronically Signed   By: Marijo Conception M.D.   On: 12/07/2019 14:03   DG Chest Port 1 View  Result Date: 12/06/2019 CLINICAL DATA:  Hypoxia. EXAM: PORTABLE CHEST 1 VIEW COMPARISON:  Chest x-ray and abdominal CT scan dated 11/26/2019 FINDINGS: There overall heart size and pulmonary vascularity are normal. The left lung is now clear. Persistent atelectasis at the right lung base. Increased density posterior medially at the right base is consistent with the infiltrate seen on the prior CT scan. Slight gaseous distention of the stomach, unchanged. No acute bone abnormality. Prominent arthritic changes at the shoulders, right greater than left. IMPRESSION: 1. Increased density posterior medially at the right lung base consistent with the infiltrate seen on the prior CT scan. 2. Persistent atelectasis at the right lung base. 3. Clearing of the left lung. Electronically Signed   By: Lorriane Shire M.D.   On: 12/06/2019 13:26   DG Chest Port 1V same Day  Result Date: 12/06/2019 CLINICAL DATA:  Status post intubation. EXAM: PORTABLE CHEST 1 VIEW COMPARISON:  Same day. FINDINGS: Stable cardiomegaly. Endotracheal and nasogastric tubes appear to be in grossly good position. Left-sided  pacemaker is unchanged. No pneumothorax pleural effusion is noted. Stable right basilar atelectasis or infiltrate is noted. Bony thorax is unremarkable. IMPRESSION: Stable right basilar atelectasis or infiltrate.  Support apparatus in grossly good position. Electronically Signed   By: Marijo Conception M.D.   On: 12/06/2019 15:38    Labs: BMET Recent Labs  Lab 12/11/2019 1922 12/06/19 0449 12/06/19 1327 12/06/19 1644 12/07/19 0007 12/07/19 0451 12/07/19 0554 12/07/19 1400 12/08/19 0431 12/08/19 0521  NA 129* 133* 131*  --  138 134* 131*  --  133* 134*  K 5.4* 5.4* 5.6* 4.8 4.2 4.5 4.4  --  4.4 4.2  CL 94* 96* 97*  --   --  95*  --   --  98  --   CO2 21* 20* 18*  --   --  24  --   --  24  --   GLUCOSE 315* 211* 198*  --   --  232*  --   --  217*  --   BUN 99* 99* 97*  --   --  103*  --   --  61*  --   CREATININE 4.04* 4.10* 4.12*  --   --  4.10*  --   --  2.46*  --   CALCIUM 8.1* 8.2* 7.8*  --   --  7.6*  --   --  7.5*  --   PHOS  --   --   --   --   --   --   --  4.6 3.8  --    CBC Recent Labs  Lab 12/06/2019 1922 12/06/19 0449 12/07/19 0451 12/07/19 0554 12/08/19 0431 12/08/19 0521  WBC 7.9 11.2* 10.6*  --  9.7  --   NEUTROABS 7.1  --  9.9*  --   --   --   HGB 9.1* 9.2* 9.5* 10.2* 9.6* 10.2*  HCT 29.8* 30.2* 30.1* 30.0* 30.0* 30.0*  MCV 99.0 97.7 95.6  --  95.5  --   PLT 108* 121* 122*  --  95*  --     Medications:    . B-complex with vitamin C  1 tablet Per Tube Daily  . chlorhexidine gluconate (MEDLINE KIT)  15 mL Mouth Rinse BID  . Chlorhexidine Gluconate Cloth  6 each Topical Q2200  . feeding supplement (PRO-STAT SUGAR FREE 64)  30 mL Per Tube QID  . hydrocortisone sod succinate (SOLU-CORTEF) inj  50 mg Intravenous Q6H  . insulin aspart  0-15 Units Subcutaneous Q4H  . mouth rinse  15 mL Mouth Rinse 10 times per day  . sodium chloride flush  10-40 mL Intracatheter Q12H  . sodium chloride flush  10-40 mL Intracatheter Q12H   Elmarie Shiley, MD 12/08/2019, 7:50 AM

## 2019-12-09 ENCOUNTER — Inpatient Hospital Stay (HOSPITAL_COMMUNITY): Payer: Medicare HMO

## 2019-12-09 DIAGNOSIS — I509 Heart failure, unspecified: Secondary | ICD-10-CM

## 2019-12-09 DIAGNOSIS — I350 Nonrheumatic aortic (valve) stenosis: Secondary | ICD-10-CM

## 2019-12-09 DIAGNOSIS — R579 Shock, unspecified: Secondary | ICD-10-CM

## 2019-12-09 LAB — DIC (DISSEMINATED INTRAVASCULAR COAGULATION)PANEL
D-Dimer, Quant: 12.47 ug/mL-FEU — ABNORMAL HIGH (ref 0.00–0.50)
Fibrinogen: 250 mg/dL (ref 210–475)
INR: 1.6 — ABNORMAL HIGH (ref 0.8–1.2)
Platelets: 73 10*3/uL — ABNORMAL LOW (ref 150–400)
Prothrombin Time: 18.8 seconds — ABNORMAL HIGH (ref 11.4–15.2)
Smear Review: NONE SEEN
aPTT: 190 seconds (ref 24–36)

## 2019-12-09 LAB — COMPREHENSIVE METABOLIC PANEL
ALT: 208 U/L — ABNORMAL HIGH (ref 0–44)
AST: 103 U/L — ABNORMAL HIGH (ref 15–41)
Albumin: 2.4 g/dL — ABNORMAL LOW (ref 3.5–5.0)
Alkaline Phosphatase: 121 U/L (ref 38–126)
Anion gap: 9 (ref 5–15)
BUN: 30 mg/dL — ABNORMAL HIGH (ref 8–23)
CO2: 25 mmol/L (ref 22–32)
Calcium: 7.8 mg/dL — ABNORMAL LOW (ref 8.9–10.3)
Chloride: 102 mmol/L (ref 98–111)
Creatinine, Ser: 1.16 mg/dL (ref 0.61–1.24)
GFR calc Af Amer: >60 mL/min (ref >60)
GFR calc non Af Amer: >60 mL/min (ref >60)
Glucose, Bld: 118 mg/dL — ABNORMAL HIGH (ref 70–99)
Potassium: 4.4 mmol/L (ref 3.5–5.1)
Sodium: 136 mmol/L (ref 135–145)
Total Bilirubin: 2.7 mg/dL — ABNORMAL HIGH (ref 0.3–1.2)
Total Protein: 5.4 g/dL — ABNORMAL LOW (ref 6.5–8.1)

## 2019-12-09 LAB — CULTURE, RESPIRATORY W GRAM STAIN

## 2019-12-09 LAB — GLUCOSE, CAPILLARY
Glucose-Capillary: 101 mg/dL — ABNORMAL HIGH (ref 70–99)
Glucose-Capillary: 97 mg/dL (ref 70–99)
Glucose-Capillary: 93 mg/dL (ref 70–99)
Glucose-Capillary: 163 mg/dL — ABNORMAL HIGH (ref 70–99)
Glucose-Capillary: 154 mg/dL — ABNORMAL HIGH (ref 70–99)
Glucose-Capillary: 179 mg/dL — ABNORMAL HIGH (ref 70–99)

## 2019-12-09 LAB — POCT ACTIVATED CLOTTING TIME
Activated Clotting Time: 213 seconds
Activated Clotting Time: 224 seconds
Activated Clotting Time: 213 seconds
Activated Clotting Time: 213 seconds
Activated Clotting Time: 213 seconds
Activated Clotting Time: 219 seconds

## 2019-12-09 LAB — CBC
HCT: 30.1 % — ABNORMAL LOW (ref 39.0–52.0)
Hemoglobin: 9.5 g/dL — ABNORMAL LOW (ref 13.0–17.0)
MCH: 30.5 pg (ref 26.0–34.0)
MCHC: 31.6 g/dL (ref 30.0–36.0)
MCV: 96.8 fL (ref 80.0–100.0)
Platelets: 69 10*3/uL — ABNORMAL LOW (ref 150–400)
RBC: 3.11 MIL/uL — ABNORMAL LOW (ref 4.22–5.81)
RDW: 20.1 % — ABNORMAL HIGH (ref 11.5–15.5)
WBC: 10.1 10*3/uL (ref 4.0–10.5)
nRBC: 0 % (ref 0.0–0.2)

## 2019-12-09 LAB — PHOSPHORUS: Phosphorus: 2.4 mg/dL — ABNORMAL LOW (ref 2.5–4.6)

## 2019-12-09 LAB — ECHOCARDIOGRAM COMPLETE
Height: 68 in
Weight: 2790.14 oz

## 2019-12-09 LAB — APTT: aPTT: >200 seconds (ref 24–36)

## 2019-12-09 LAB — MAGNESIUM: Magnesium: 2.7 mg/dL — ABNORMAL HIGH (ref 1.7–2.4)

## 2019-12-09 MED ORDER — SODIUM CHLORIDE 0.9 % IV SOLN
0.3000 ug/kg | Freq: Once | INTRAVENOUS | Status: AC
  Administered 2019-12-09: 23.6 ug via INTRAVENOUS
  Filled 2019-12-09: qty 5.9

## 2019-12-09 MED ORDER — "THROMBI-PAD 3""X3"" EX PADS"
2.0000 | MEDICATED_PAD | Freq: Once | CUTANEOUS | Status: AC
  Administered 2019-12-09: 13:00:00 2 via TOPICAL
  Filled 2019-12-09: qty 2

## 2019-12-09 MED ORDER — SODIUM CHLORIDE 0.9 % IV SOLN
1.0000 g | INTRAVENOUS | Status: DC
  Administered 2019-12-10 – 2019-12-13 (×4): 1 g via INTRAVENOUS
  Filled 2019-12-09 (×4): qty 1

## 2019-12-09 MED ORDER — "THROMBI-PAD 3""X3"" EX PADS"
1.0000 | MEDICATED_PAD | CUTANEOUS | Status: AC
  Administered 2019-12-09: 1 via TOPICAL
  Filled 2019-12-09: qty 1

## 2019-12-09 MED ORDER — SODIUM CHLORIDE 3 % IN NEBU
4.0000 mL | INHALATION_SOLUTION | Freq: Two times a day (BID) | RESPIRATORY_TRACT | Status: AC
  Administered 2019-12-10 – 2019-12-11 (×4): 4 mL via RESPIRATORY_TRACT
  Filled 2019-12-09 (×6): qty 4

## 2019-12-09 MED ORDER — HYDROCORTISONE NA SUCCINATE PF 100 MG IJ SOLR
50.0000 mg | Freq: Every day | INTRAMUSCULAR | Status: DC
  Administered 2019-12-10 – 2019-12-15 (×6): 50 mg via INTRAVENOUS
  Filled 2019-12-09 (×6): qty 2

## 2019-12-09 MED ORDER — VANCOMYCIN VARIABLE DOSE PER UNSTABLE RENAL FUNCTION (PHARMACIST DOSING): Status: DC

## 2019-12-09 MED ORDER — ADULT MULTIVITAMIN W/MINERALS CH
1.0000 | ORAL_TABLET | Freq: Every day | ORAL | Status: DC
  Administered 2019-12-09 – 2019-12-17 (×9): 1 via ORAL
  Filled 2019-12-09 (×9): qty 1

## 2019-12-09 MED ORDER — ENSURE ENLIVE PO LIQD
237.0000 mL | Freq: Two times a day (BID) | ORAL | Status: DC
  Administered 2019-12-10 – 2019-12-15 (×9): 237 mL via ORAL

## 2019-12-09 MED FILL — Medication: Qty: 1 | Status: AC

## 2019-12-09 NOTE — Progress Notes (Signed)
CRITICAL VALUE ALERT  Critical Value:  PTT >200  Date & Time Notified:  12/09/19 0746   Provider Notified: Dr. Erskine Emery  Orders Received/Actions taken: No new orders, stated he would be around to see patient soon. Patient H&H stable, no active bleeding, HD catheter site slightly oozing.  Joellen Jersey, RN

## 2019-12-09 NOTE — Consult Note (Addendum)
The patient has been seen in conjunction with Reino Bellis.. All aspects of care have been considered and discussed. The patient has been personally interviewed, examined, and all clinical data has been reviewed.   Severe aortic stenosis -   Shock -  Contributors include severe aortic stenosis which could be causing a low flow state especially in light of decreased LV systolic function.  This could have accounted for the patient's altered mental status on admission.  Sepsis has not been excluded as a contributing factor.  History of coronary disease/Left BKA/ uses motorized WC/ debilitating RA.  CKD stage IV and currently requiring CRRT this admission. In past, on dialysis for 3 months.  Frailty is a major issue.  Overall prognosis is concerning given multiorgan involvement.  We will get structural heart team involved in care to help make decision about whether TAVR of aortic valve would be helpful. Seems it will be unlikely to change QOL of longevity substantially.    Cardiology Consultation:   Patient ID: [name]: [id number]; DOB: [birth year]  Admit date: 12/14/2019 Date of Consult: 12/09/2019  Primary Care Provider: [name], MD Primary Cardiologist: Carlyle Dolly, MD  Primary Electrophysiologist:  None    Patient Profile:   [name] is a 68 y.o. male with a hx of severe AS, CAD s/p CABG x3 ('16), chronic combined HF, HTN, DM, Post op Atrial Fibrillation, PAD s/p left BKA, CKD III, RA who is being seen today for the evaluation of CHF and AS at the request of Dr. Vaughan Browner.  History of Present Illness:   [name] is a 68 yo male with PMH noted above.  He is followed by Dr. Harl [name] as an outpatient.  Also followed in the valve clinic.  He was admitted in 06/2019 for chest pain and found to be hypotensive and bradycardic.  Treated with atropine, bicarb, epi and IV fluids with good response.  Echo during that admission showed an EF of 60 to 65% with  thickened/calcified aortic valve leaflet with limited mobility.  Mean gradient of 16.8 mmHg.  He was also noted to have AKI with creatinine peaking at 3.28 and elevated LFTs.  Outpatient stress test was done and showed medium defect of mild severity present on the mid inferior septal and mid inferior location.  No large ischemic territories were noted.  He was seen by Dr. Angelena Form in the structural heart clinic on 08/29/2019 to discuss possible TAVR.  At that point he reported chest pain concerning for unstable angina.  He ended up being sent to the ED for admission.  Cardiology team.  EKG on arrival showed junctional escape rhythm of 36 bpm.  He underwent temporary pacemaker placement and then permanent pacemaker on 08/30/2019.  He underwent left and right cardiac cath on 09/03/2019 which showed patent LIMA to the LAD, SVG to RI with moderate ostial and proximal disease in the SVG to RPDA which was felt not to be a good PCI target.  Normal right heart pressures were noted.  He was seen in the cardiology clinic on 09/10/2019 with complaints of productive cough, increased shortness of breath and lower extremity edema.  Weight was up 4 pounds since discharge.  Labs showed acute renal failure with creatinine of 3.1 and he was advised to go to the ED.  He was admitted for acute renal failure, CHF and UTI.  Nephrology was consulted on admission.  He was seen in the office by nephrology on 10/10/2019 at which time he  was doing well.  They discussed with the patient the need for further contrast for TAVR work-up and possible renal replacement therapy.  He was admitted for IV fluids prior to CT scans.  Creatinine remains stable at 1.3 and he was discharged home to continue further TAVR work-up.  Presented to the [facility] on 12/06/2019 with progressive confusion.  He was found to be septic, and ultimately intubated and placed on pressors.  Transferred to [facility] for further management under the care of PCCM.  Nephrology was  consulted given worsening renal failure and decreasing urine output.  He was placed on CRRT on 12/07/2019.  He was able to be weaned from pressors, and extubated on 12/08/2019.  Heart Pathway Score:     Past Medical History:  Diagnosis Date  . Aortic stenosis   . ARF (acute renal failure) ([place]) 09/11/2019  . Chronic back pain   . Chronic combined systolic (congestive) and diastolic (congestive) heart failure (HCC)    a. EF 30-35% in 2016 b. at 30-35% by echo in 08/2016 c. 06/2019: echo showing EF of 60-65% but found to have severe AS.   [name] Chronic neck pain   . CKD (chronic kidney disease), stage III   . Complete heart block ([place]) 08/29/2019   a. s/p Medtronic dual chamber pacemeker implantation on 08/30/2019.  [name] Coronary artery disease    a. s/p CABG in 06/2015 with LIMA-LAD, SVG-PDA, SVG-Intermediate  . Diabetes mellitus   . Diabetic neuropathy ([place])   . Hyperkalemia   . Hypertension   . Loculated pleural effusion 09/09/2016  . Pancytopenia ([place]) 08/13/2014  . 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  . Rheumatoid arthritis(714.0)   . Seizure ([place]) 08/13/2014   Pt denies    Past Surgical History:  Procedure Laterality Date  . 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 CARDIAC SURGERY    . FRACTURE SURGERY    . MUSCLE BIOPSY  06/2016  . PACEMAKER IMPLANT N/A 08/30/2019   Procedure: PACEMAKER IMPLANT;  Surgeon: Evans Lance, MD;  Location: [place] [facility];  Service: Cardiovascular;  Laterality: N/A;  . right foot surgery-Toe amputations    . RIGHT/LEFT HEART CATH AND CORONARY/GRAFT ANGIOGRAPHY N/A 09/03/2019   Procedure: RIGHT/LEFT HEART CATH AND CORONARY/GRAFT ANGIOGRAPHY;  Surgeon: Leonie Man, MD;  Location: [place] [facility];  Service: Cardiovascular;  Laterality: N/A;  . TEMPORARY PACEMAKER N/A 08/29/2019   Procedure: TEMPORARY PACEMAKER;  Surgeon: Sherren Mocha, MD;  Location: [place] [facility];  Service:  Cardiovascular;  Laterality: N/A;  . TOTAL HIP ARTHROPLASTY Left      Home Medications:  Prior to Admission medications   Medication Sig Start Date End Date Taking? Authorizing Provider  amitriptyline (ELAVIL) 50 MG tablet TAKE 1 AND 1/2 TABLETS BY MOUTH AT BEDTIME. Patient taking differently: Take 75 mg by mouth at bedtime.  05/06/19  Yes [name], MD  aspirin EC 81 MG tablet Take 81 mg by mouth daily.   Yes [provider]  atorvastatin (LIPITOR) 80 MG tablet TAKE ONE TABLET BY MOUTH ONCE DAILY. Patient taking differently: Take 80 mg by mouth daily.  06/03/19  Yes [name], MD  Cholecalciferol (VITAMIN D3) 25 MCG (1000 UT) CHEW Chew 1,000 Units by mouth daily.    Yes [provider]  clonazePAM (KLONOPIN) 0.25 MG disintegrating tablet DISSOLVE 1 TABLET BY MOUTH DAILY AS NEEDED FOR ANXIETY. Patient taking differently: Take 0.25 mg  by mouth daily as needed (for anxiety- dissolve in the mouth).  07/22/19  Yes [name], MD  docusate sodium (COLACE) 100 MG capsule Take 100 mg by mouth 3 (three) times daily.    Yes [provider]  DULoxetine (CYMBALTA) 30 MG capsule Take 30 mg by mouth at bedtime.  10/16/18 12/02/2019 Yes [provider]  finasteride (PROSCAR) 5 MG tablet Take 5 mg by mouth daily.  04/24/18  Yes [provider]  hydroxychloroquine (PLAQUENIL) 200 MG tablet Take 200 mg by mouth daily.   Yes [provider]  insulin aspart protamine- aspart (NOVOLOG MIX 70/30) (70-30) 100 UNIT/ML injection Inject 12 units into skin in the morning  and 8 units evening Patient taking differently: Inject 8-12 Units into the skin See admin instructions. Inject 12 units into the skin in the morning and 8 units in the evening 05/06/19  Yes [name], MD  methadone (DOLOPHINE) 10 MG tablet Take 10 mg by mouth every 6 (six) hours.  04/18/18  Yes [provider]  methadone (DOLOPHINE) 5 MG tablet Take 5 mg by mouth 3  (three) times daily as needed (for breakthrough pain).  04/18/18  Yes [provider]  Polyvinyl Alcohol-Povidone (REFRESH OP) Place 1 drop into both eyes 3 (three) times daily as needed (for dry eyes).    Yes [provider]  predniSONE (DELTASONE) 5 MG tablet TAKE 1 TABLET BY MOUTH ONCE A DAY WITH BREAKFAST. Patient taking differently: Take 5 mg by mouth daily with breakfast.  06/03/19  Yes [name], MD  pregabalin (LYRICA) 25 MG capsule TAKE ONE CAPSULE BY MOUTH AT BEDTIME. Patient taking differently: Take 25 mg by mouth at bedtime.  06/03/19  Yes [name], MD  sodium chloride HYPERTONIC 3 % nebulizer solution Take by nebulization 2 (two) times daily. 10/31/19  Yes Noemi Chapel P, DO  tamsulosin (FLOMAX) 0.4 MG CAPS capsule Take 1 capsule (0.4 mg total) by mouth at bedtime. 05/06/19  Yes [name], MD  torsemide (DEMADEX) 10 MG tablet TAKE 2 TABLETS BY MOUTH EVERY OTHER DAY. Patient taking differently: Take 20 mg by mouth every other day.  05/06/19  Yes [name], MD  voriconazole (VFEND) 200 MG tablet Take 1 tablet (200 mg total) by mouth 2 (two) times daily. 10/31/19  Yes Noemi Chapel P, DO  blood glucose meter kit and supplies KIT Dispense based on patient and insurance preference. Use up to 3 times daily as directed. DX:E11.9 08/21/19   [name], MD  nitroGLYCERIN (NITROSTAT) 0.4 MG SL tablet Place 1 tablet (0.4 mg total) under the tongue every 5 (five) minutes x 3 doses as needed for chest pain. 09/04/19   [facility], Tami Lin, PA  Respiratory Therapy Supplies (FLUTTER) DEVI 1 each by Does not apply route 2 (two) times daily. 10/31/19   Julian Hy, DO    Inpatient Medications: Scheduled Meds: . B-complex with vitamin C  1 tablet Per Tube Daily  . chlorhexidine  15 mL Mouth Rinse BID  . Chlorhexidine Gluconate Cloth  6 each Topical Q2200  . [START ON 12/10/2019] hydrocortisone sod succinate (SOLU-CORTEF) inj  50 mg Intravenous Daily  .  insulin aspart  0-15 Units Subcutaneous Q4H  . mouth rinse  15 mL Mouth Rinse q12n4p  . methadone  10 mg Per Tube Q6H  . sodium chloride flush  10-40 mL Intracatheter Q12H  . sodium chloride flush  10-40 mL Intracatheter Q12H  . sodium chloride  HYPERTONIC  4 mL Nebulization BID  . Thrombi-Pad  1 each Topical STAT  . Thrombi-Pad  2 each Topical Once  . vancomycin variable dose per unstable renal function (pharmacist dosing)   Does not apply See admin instructions   Continuous Infusions: . sodium chloride    . [START ON 12/10/2019] ceFEPime (MAXIPIME) IV    . heparin    . lactated ringers 10 mL/hr at 12/09/19 0900   PRN Meds: sodium chloride, acetaminophen **OR** acetaminophen, Gerhardt's butt cream, heparin, methadone, naLOXone (NARCAN)  injection, polyethylene glycol, sodium chloride flush, sodium chloride flush  Allergies:    Allergies  Allergen Reactions  . Sulfa Antibiotics Rash    Social History:   Social History   Socioeconomic History  . Marital status: Married    Spouse name: Not on file  . Number of children: 2  . Years of education: Not on file  . Highest education level: Not on file  Occupational History  . Occupation: Retired Architect  Tobacco Use  . Smoking status: Never Smoker  . Smokeless tobacco: Never Used  Substance and Sexual Activity  . Alcohol use: No  . Drug use: No  . Sexual activity: Yes  Other Topics Concern  . Not on file  Social History Narrative  . Not on file   Social Determinants of Health   Financial Resource Strain:   . Difficulty of Paying Living Expenses: Not on file  Food Insecurity:   . Worried About Charity fundraiser in the Last Year: Not on file  . Ran Out of Food in the Last Year: Not on file  Transportation Needs:   . Lack of Transportation (Medical): Not on file  . Lack of Transportation (Non-Medical): Not on file  Physical Activity:   . Days of Exercise per Week: Not on file  . Minutes of Exercise per Session:  Not on file  Stress:   . Feeling of Stress : Not on file  Social Connections:   . Frequency of Communication with Friends and Family: Not on file  . Frequency of Social Gatherings with Friends and Family: Not on file  . Attends Religious Services: Not on file  . Active Member of Clubs or Organizations: Not on file  . Attends Archivist Meetings: Not on file  . Marital Status: Not on file  Intimate Partner Violence:   . Fear of Current or Ex-Partner: Not on file  . Emotionally Abused: Not on file  . Physically Abused: Not on file  . Sexually Abused: Not on file    Family History:    Family History  Problem Relation Age of Onset  . Diabetes Father   . Alzheimer's disease Father   . Heart attack Brother        multiple brothers with MIs  . COPD Brother   . Hypertension Mother   . Stroke Mother   . Head & neck cancer Nephew      ROS:  Please see the history of present illness.   All other ROS reviewed and negative.     Physical Exam/Data:   Vitals:   12/09/19 0906 12/09/19 0930 12/09/19 0932 12/09/19 1115  BP:  (!) 71/39 (!) 83/56   Pulse:  83 77   Resp:  13 19   Temp: (!) 97.4 F (36.3 C)   (!) 97.1 F (36.2 C)  TempSrc: Oral   Oral  SpO2:  100% 100%   Weight:      Height:  Intake/Output Summary (Last 24 hours) at 12/09/2019 1321 Last data filed at 12/09/2019 0900 Gross per 24 hour  Intake 636.65 ml  Output 1678 ml  Net -1041.35 ml   Last 3 Weights 12/09/2019 12/08/2019 12/08/2019  Weight (lbs) 174 lb 6.1 oz 177 lb 7.5 oz 177 lb 7.5 oz  Weight (kg) 79.1 kg 80.5 kg 80.5 kg     Body mass index is 26.51 kg/m.  General:  Thin frail, older WM, in no acute distress. Wearing [place]. HEENT: normal Lymph: no adenopathy Neck: no JVD Endocrine:  No thryomegaly Vascular: No carotid bruits Cardiac:  normal S1, S2; RRR; 3/6 systolic murmur Lungs:  Mild crackles in bases Abd: soft, nontender, no hepatomegaly  Ext: no edema Musculoskeletal:  Left  BKA, right foot in bootie. Mild edema. Skin: warm and dry  Neuro:  CNs 2-12 intact, no focal abnormalities noted Psych:  Normal affect   EKG:  The EKG was personally reviewed and demonstrates:  AV paced Telemetry:  Telemetry was personally reviewed and demonstrates:  SR with intermittent pacing.  Relevant CV Studies:  Echo 11/08/2019:   IMPRESSIONS    1. Left ventricular ejection fraction, by visual estimation, is 40 to 45%. The left ventricle has mildly decreased function. There is mildly increased left ventricular hypertrophy.  2. Left ventricular diastolic parameters are indeterminate.  3. Global right ventricle has low normal systolic function.The right ventricular size is normal. No increase in right ventricular wall thickness.  4. Left atrial size was severely dilated.  5. Right atrial size was normal.  6. Moderate calcification of the mitral valve leaflet(s).  7. Moderate mitral annular calcification.  8. Moderate thickening of the mitral valve leaflet(s).  9. Severe aortic valve annular calcification. 10. The mitral valve is abnormal. Mild to moderate mitral valve regurgitation. Mild mitral stenosis. 11. The tricuspid valve is normal in structure. Tricuspid valve regurgitation is not demonstrated. 12. Aortic valve regurgitation is moderate. 13. The aortic valve has an indeterminant number of cusps. Aortic valve regurgitation is moderate. Severe aortic valve stenosis. 14. There is Severe calcifcation of the aortic valve. 15. There is Severely thickening of the aortic valve. 16. The pulmonic valve was not well visualized. Pulmonic valve regurgitation is not visualized. 17. The interatrial septum was not well visualized.  FINDINGS  Laboratory Data:  High Sensitivity Troponin:   Recent Labs  Lab 12/07/19 0811 12/07/19 1003  TROPONINIHS 156* 163*     Chemistry Recent Labs  Lab 12/08/19 0431 12/08/19 0521 12/08/19 1530 12/09/19 0451  NA 133* 134* 135 136  K  4.4 4.2 4.1 4.4  CL 98  --  101 102  CO2 24  --  26 25  GLUCOSE 217*  --  181* 118*  BUN 61*  --  46* 30*  CREATININE 2.46*  --  1.58* 1.16  CALCIUM 7.5*  --  7.6* 7.8*  GFRNONAA 26*  --  44* >60  GFRAA 30*  --  51* >60  ANIONGAP 11  --  8 9    Recent Labs  Lab 12/07/19 0451 12/08/19 0431 12/08/19 1530 12/09/19 0451  PROT 5.7* 5.5*  --  5.4*  ALBUMIN 2.6* 2.5* 2.3* 2.4*  AST 255* 159*  --  103*  ALT 336* 258*  --  208*  ALKPHOS 131* 134*  --  121  BILITOT 2.8* 2.8*  --  2.7*   Hematology Recent Labs  Lab 12/07/19 0451 12/08/19 0431 12/08/19 0521 12/09/19 0451 12/09/19 1027  WBC 10.6* 9.7  --  10.1  --   RBC 3.15* 3.14*  --  3.11*  --   HGB 9.5* 9.6* 10.2* 9.5*  --   HCT 30.1* 30.0* 30.0* 30.1*  --   MCV 95.6 95.5  --  96.8  --   MCH 30.2 30.6  --  30.5  --   MCHC 31.6 32.0  --  31.6  --   RDW 18.9* 19.8*  --  20.1*  --   PLT 122* 95*  --  69* 73*   BNP Recent Labs  Lab 12/07/19 0451  BNP >4,500.0*    DDimer  Recent Labs  Lab 12/09/19 1027  DDIMER 12.47*     Radiology/Studies:  CT ABDOMEN PELVIS WO CONTRAST  Result Date: 12/03/2019 CLINICAL DATA:  Nausea and vomiting. EXAM: CT ABDOMEN AND PELVIS WITHOUT CONTRAST TECHNIQUE: Multidetector CT imaging of the abdomen and pelvis was performed following the standard protocol without IV contrast. COMPARISON:  Abdominal CTA 10/28/2019 FINDINGS: Lower chest: Multi chamber cardiomegaly. Dense coronary artery calcifications. Small pleural effusions with irregular consolidative and ground-glass opacities in both lower lobes. Hepatobiliary: Small volume of perihepatic ascites is new from prior CT. No gross focal hepatic lesion allowing for lack contrast and streak artifact from arms down positioning. Layering hyperdensity in the gallbladder likely combination of stones and sludge. Motion limits detailed assessment for pericholecystic stranding. No biliary dilatation. Pancreas: Diffuse fatty atrophy. No ductal dilatation  or inflammation. Spleen: Normal in size without focal abnormality. Adrenals/Urinary Tract: Normal adrenal glands. No hydronephrosis. Mild thinning of bilateral renal parenchyma with lobulated renal contours. 15 mm lesion from the interpolar left kidney unchanged from prior exam. Advanced renal vascular calcifications. Urinary bladder is partially distended. Left aspect of the bladder is obscured by streak artifact from left hip arthroplasty. Stomach/Bowel: Air-fluid level in the stomach which is mildly distended. No gastric or duodenal wall thickening. No small bowel dilatation or obstruction. Appendix is tentatively visualized and normal. No evidence of appendicitis. Large stool burden in the ascending transverse, and proximal descending colon. More distal descending colon is air-filled distal colon is decompressed. There is diffuse colonic tortuosity. No evidence of colonic wall thickening or inflammation. Vascular/Lymphatic: Advanced vascular disease. Advanced aortic and branch atherosclerosis. No aortic aneurysm. No bulky abdominopelvic adenopathy. Reproductive: No acute findings. Borderline enlarged prostate gland. Other: Small volume of ascites in the pelvis, both pericolic gutters, and right upper quadrant. No free air. No loculated intra-abdominal fluid collection. Mild body wall edema, confluent in the flanks. Small fat containing umbilical hernia. Fat in the left greater than right inguinal canal. Musculoskeletal: Multilevel degenerative change in the spine. Similar appearance of degenerative disc disease at L3-L4. There are no acute or suspicious osseous abnormalities. Left hip arthroplasty. IMPRESSION: 1. Mild gastric distension with air-fluid level, can be seen with gastroenteritis or gastroparesis. No gastric wall thickening or evidence of gastric outlet obstruction. 2. Small volume of ascites in the abdomen and pelvis, new from prior CT. Mild body wall edema. 3. Large stool burden with colonic  tortuosity, suggesting constipation. No bowel obstruction. 4. Bilateral pleural effusions. Irregular streaky consolidative airspace opacities in both lower lobes, increased from CT last month. Pneumonia or aspiration considered. 5. Severe aortic and branch atherosclerosis. Aortic Atherosclerosis (ICD10-I70.0). Electronically Signed   By: Keith Rake M.D.   On: 12/13/2019 23:58   CT Head Wo Contrast  Result Date: 12/14/2019 CLINICAL DATA:  Encephalopathy EXAM: CT HEAD WITHOUT CONTRAST TECHNIQUE: Contiguous axial images were obtained from the base of the skull through the vertex without  intravenous contrast. COMPARISON:  None. FINDINGS: Brain: There is no mass, hemorrhage or extra-axial collection. The size and configuration of the ventricles and extra-axial CSF spaces are normal. There is hypoattenuation of the white matter, most commonly indicating chronic small vessel disease. Vascular: Atherosclerotic calcification of the vertebral and internal carotid arteries at the skull base. No abnormal hyperdensity of the major intracranial arteries or dural venous sinuses. Skull: The visualized skull base, calvarium and extracranial soft tissues are normal. Sinuses/Orbits: No fluid levels or advanced mucosal thickening of the visualized paranasal sinuses. No mastoid or middle ear effusion. The orbits are normal. IMPRESSION: Findings of chronic small vessel disease without acute intracranial abnormality. Electronically Signed   By: Ulyses Jarred M.D.   On: 12/02/2019 19:28   DG Chest Port 1 View  Result Date: 12/09/2019 CLINICAL DATA:  68 year old male with weakness and respiratory failure negative for COVID-19. EXAM: PORTABLE CHEST 1 VIEW COMPARISON:  12/08/2019 and earlier. FINDINGS: Portable AP semi upright view at 0543 hours. Extubated. Enteric tube removed. Stable right IJ central line. Stable left chest cardiac pacemaker. Stable lung volumes and mediastinal contours. Mildly improved bibasilar ventilation  with residual patchy opacity. No pneumothorax, pulmonary edema or pleural effusion. Negative visible bowel gas pattern. IMPRESSION: 1. Extubated and enteric tube removed. 2. Stable lung volumes with improved ventilation, decreased atelectasis. Electronically Signed   By: Genevie Ann M.D.   On: 12/09/2019 08:11   DG CHEST PORT 1 VIEW  Result Date: 12/08/2019 CLINICAL DATA:  Follow-up for respiratory failure. EXAM: PORTABLE CHEST 1 VIEW COMPARISON:  12/07/2019 and older studies. FINDINGS: Right basilar atelectasis.  Remainder of the lungs is clear. No mediastinal widening. No pneumothorax or convincing pleural effusion. Endotracheal tube, right internal jugular central venous line and nasal/orogastric tube as well as the left anterior chest wall pacemaker are stable. IMPRESSION: 1. No change from the previous day's study. 2. Persistent mild right lung base atelectasis. No new lung abnormalities. 3. Stable support apparatus. Electronically Signed   By: Lajean Manes M.D.   On: 12/08/2019 10:57   DG CHEST PORT 1 VIEW  Result Date: 12/07/2019 CLINICAL DATA:  Status post insertion of new catheter. EXAM: PORTABLE CHEST 1 VIEW COMPARISON:  December 06, 2019. FINDINGS: Stable cardiomegaly. Endotracheal and nasogastric tubes are unchanged in position. Interval placement of right internal jugular catheter with distal tip in expected position of SVC. Left-sided pacemaker is unchanged in position. No pneumothorax pleural effusion is noted. Mild right basilar atelectasis or infiltrate is noted. Bony thorax is unremarkable. IMPRESSION: Interval placement of right internal jugular catheter with distal tip in expected position of SVC. No pneumothorax is noted. Mild right basilar atelectasis or infiltrate is noted. Electronically Signed   By: Marijo Conception M.D.   On: 12/07/2019 14:03   DG Chest Port 1 View  Result Date: 12/06/2019 CLINICAL DATA:  Hypoxia. EXAM: PORTABLE CHEST 1 VIEW COMPARISON:  Chest x-ray and abdominal  CT scan dated 12/16/2019 FINDINGS: There overall heart size and pulmonary vascularity are normal. The left lung is now clear. Persistent atelectasis at the right lung base. Increased density posterior medially at the right base is consistent with the infiltrate seen on the prior CT scan. Slight gaseous distention of the stomach, unchanged. No acute bone abnormality. Prominent arthritic changes at the shoulders, right greater than left. IMPRESSION: 1. Increased density posterior medially at the right lung base consistent with the infiltrate seen on the prior CT scan. 2. Persistent atelectasis at the right lung base. 3. Clearing of  the left lung. Electronically Signed   By: Lorriane Shire M.D.   On: 12/06/2019 13:26   DG Chest Port 1 View  Result Date: 11/26/2019 CLINICAL DATA:  Chronic cough and shortness of breath EXAM: PORTABLE CHEST 1 VIEW COMPARISON:  September 11, 2019 FINDINGS: Patchy opacities are identified in the left suprahilar region, right lung base. Elevated right hemidiaphragm is identified, chronic. There is no pleural effusion. Cardiac pacemaker is unchanged. The heart size is enlarged. IMPRESSION: Patchy opacity identified in the left perihilar region and right lung base, pneumonia not excluded. Electronically Signed   By: Abelardo Diesel M.D.   On:  19:58   DG Chest Port 1V same Day  Result Date: 12/06/2019 CLINICAL DATA:  Status post intubation. EXAM: PORTABLE CHEST 1 VIEW COMPARISON:  Same day. FINDINGS: Stable cardiomegaly. Endotracheal and nasogastric tubes appear to be in grossly good position. Left-sided pacemaker is unchanged. No pneumothorax pleural effusion is noted. Stable right basilar atelectasis or infiltrate is noted. Bony thorax is unremarkable. IMPRESSION: Stable right basilar atelectasis or infiltrate. Support apparatus in grossly good position. Electronically Signed   By: Marijo Conception M.D.   On: 12/06/2019 15:38    Assessment and Plan:   HUSTON STONEHOCKER is a  68 y.o. male with a hx of severe AS, CAD s/p CABG x3 ('16), chronic combined HF, HTN, DM, Post op Atrial Fibrillation, PAD s/p left BKA, CKD III, RA who is being seen today for the evaluation of CHF and AS at the request of Dr. Vaughan Browner.  1. Severe AS: had been undergoing TAVR work prior to admission. Had CT scans and cath back in November. Being following in the structural heart clinic. Now admitted with multisystem organ failure, but improving. Very decondition, malnourished, frail. Spoke with TAVR team who plan to review his case during meeting in the morning and follow up with recommendations.    2. Acute shock/respiratory failure: felt to be a mixed presentation of cardiogenic and septic components. He has been weaned from pressors and extubated 12/13. On antibiotic therapy per PCCM.   3. Acute on Chronic renal failure: Nephrology following, had been on CRRT but planned for intermittent HD starting tomorrow.   4. Elevated LFTs/ worsening coagulopathy: LFTs are improving, but platelets have declined to 69 today. Concern for DIC as he had notable bleeding at central line sites.   5. Severe malnutrition: Dietary consult placed per primary  6. GOC: palliative care medicine consulted today after PCCM discussion with patient and his wife concerning his wishes moving forward.   For questions or updates, please contact [place] Please consult [email or web address] for contact info under     Signed, Reino Bellis, NP  12/09/2019 1:21 PM

## 2019-12-09 NOTE — Progress Notes (Signed)
Pharmacy Antibiotic Note  [name] is a 68 y.o. male admitted on 12/23/2019 with sepsis.  Pharmacy has been consulted for vancomycin dosing. Pt was initially on meropenem as well but now to transition to cefepime. Pt started on CRRT for acute on chronic renal failure which is now being stopped with plan to likely transition to iHD soon.   Plan: Stop meropenem Begin cefepime 1g IV q24h tomorrow Hold vancomycin for now Will check vancomycin levels pending renal plans   Height: 5\' 8"  (172.7 cm) Weight: 174 lb 6.1 oz (79.1 kg) IBW/kg (Calculated) : 68.4  Temp (24hrs), Avg:96.1 F (35.6 C), Min:94.1 F (34.5 C), Max:98.4 F (36.9 C)  Recent Labs  Lab 11/27/2019 1922 12/26/2019 2018 12/06/19 0127 12/06/19 0449 12/06/19 1327 12/06/19 1336 12/06/19 1644 12/07/19 0451 12/08/19 0431 12/08/19 1530 12/09/19 0451  WBC 7.9  --   --  11.2*  --   --   --  10.6* 9.7  --  10.1  CREATININE 4.04*  --   --  4.10* 4.12*  --   --  4.10* 2.46* 1.58* 1.16  LATICACIDVEN  --  2.3* 2.8* 3.0*  --  4.5* 3.8*  --   --   --   --     Estimated Creatinine Clearance: 57.3 mL/min (by C-G formula based on SCr of 1.16 mg/dL).    Allergies  Allergen Reactions  . Sulfa Antibiotics Rash   Microbiology: 12/10 COVID - neg 12/10 BCx - ngtd 12/10 UCx - ngF 12/11 MRSA - neg 12/11 resp PCR - neg  Antimicrobials: CTX 12/10 >> 12/11 Doxy 12/10 >> 12/11 Vancomycin 12/12 >> Meropenem 12/12 >>12/14 Eraxis 12/12 x1 Cefepime 12/15 >>   Arrie Senate, PharmD, BCPS Clinical Pharmacist [phone] Please check AMION for all [facility] Pharmacy numbers 12/09/2019

## 2019-12-09 NOTE — Progress Notes (Signed)
NAME:  [name], MRN:  [id number], DOB:  Feb 15, 1951, LOS: 3 ADMISSION DATE:  12/16/2019, CONSULTATION DATE:  12/07/2019 REFERRING MD:  TRH, CHIEF COMPLAINT: Altered mental status  Brief History   Septic 68 yo male with hx of RA, HFrEF, severe AS, bronchiectasis.  Admitted from [facility] with shock, multiorgan failure.  Initiated on CRRT. Rapid improvement.  Extubated 12/13 and CRRT stopping 12/14.  Past Medical History  Rheumatoid arthritis, hypertension, coronary artery disease, diabetes mellitus, diastolic and systolic CHF, severe aortic stenosis, complete heart block status post pacer, CKD 3, PAD s/p left above-knee amputation, loculated pleural effusion  Recently started on voriconazole in early December 2020 for Candida tropicalis infection/colonization of lung.   Significant Hospital Events   12/10 > Admitted to [facility] 12/11 > Transferred to [facility] 12/12 > Initiated CVVH, off pressors 12/13 > extubated  12/14 > stopping CRRT  Consults:  Nephrology PCCM  Procedures:  ETT 12/11 >> 12/13 Right femoral CVL 12/11 >> Right IJ HD catheter 12/12 >>  Significant Diagnostic Tests:  12/10 [facility] >>chronic small vessel disease without acute intracranial abnormality  12/10 CT a/p >> 1. Mild gastric distension with air-fluid level, can be seen with gastroenteritis or gastroparesis. No gastric wall thickening or evidence of gastric outlet obstruction. 2. Small volume of ascites in the abdomen and pelvis, new from prior CT. Mild body wall edema. 3. Large stool burden with colonic tortuosity, suggesting constipation. No bowel obstruction. 4. Bilateral pleural effusions. Irregular streaky consolidative airspace opacities in both lower lobes, increased from CT last month. Pneumonia or aspiration considered. 5. Severe aortic and branch atherosclerosis. Aortic Atherosclerosis   Micro Data:  12/10 SARS2 >> neg 12/11 MRSA PCR >> neg 12/11 RVP >> neg 12/10 UC >> neg 12/10 BCx 2  >> 12/11 trach asp >> few staph/ GNR 12/12 trach asp >>  Antimicrobials:  Ceftriaxone 12/10 >>12/11 Doxycycline 12/10  >> 12/11 Vancomycin 12/11 >> Meropenem 12/12 >> Anidulafungin 1 dose 12/11  Interim history/subjective:  Nephrology stopping CRRT this am RN reports high PTT, oozing from IV sites, no obvious blood in urine/ stools  Remains afebrile, hemodynamically stable off pressors and tolerating extubation since yesterday  Patient complains of his chronic generalized pain  Objective   Blood pressure (!) 145/51, pulse 82, temperature (!) 97.4 F (36.3 C), temperature source Oral, resp. rate 17, height 5\' 8"  (1.727 m), weight 79.1 kg, SpO2 100 %. CVP:  [10 mmHg-41 mmHg] 16 mmHg      Intake/Output Summary (Last 24 hours) at 12/09/2019 0086 Last data filed at 12/09/2019 0900 Gross per 24 hour  Intake 805.37 ml  Output 2324 ml  Net -1518.63 ml   Filed Weights   12/08/19 0500 12/08/19 2000 12/09/19 0600  Weight: 80.5 kg 80.5 kg 79.1 kg   Examination: General:  Chronically ill appearing elderly male sitting upright in bed in NAD HEENT: MM pink/moist, pupils 3/reactive Neuro: Awake, oriented to person/ place, follows commands CV: rr, loud murmur PULM:  Non labored, weak cough- small clear productive secretions, clear, diminished bases  GI: soft, bs +, foley- cyu Extremities: cool/dry, left AKA, no RLE edema, prior amputation of right toes, great toe remains, contracted hands from RA - unable to feed/ suction himself Skin: bruising to chest, arms, oozing from L IJ trialysis and right femoral site    Resolved Hospital Problem list   NA  Assessment & Plan:  68 year old admitted with shock, mixed Septic and cardiogenic.  History of bronchiectasis with Candida infection, pseudomonal  UTI, severe aortic stenosis.  Rapid improvement clinical status.   Acute shock, likely mixed cardiogenic and septic- resolved  Remains off pressors since 12/12 Weaning stress dose steroids to  baseline prednisone 5mg  daily  Following culture data Empiric vanc/ meropenum Consider d/c CVL when coagulopathy improved   Acute respiratory failure, history of bronchiectasis, Candida infection Extubated 12/13 Aggressive pulmonary hygiene Pending SLP, OT/ PT, IS, flutter Following trach asp  Severe aortic stenosis Markedly elevated BNP, wts improved 82 -> now 79.1 kg Most recent echo 11/08/2019 Cardiology consulted, unclear if patient is TAVR candidate.   Acute on chronic renal failure Per Nephrology Stopping CRRT today  Improving AKI with adequate U0P Strict I/Os  Coagulopathy, elevated LFTs - worsening thrombocytopenia [id number] - stable Hgb  - check DIC panel  - Holding outpatient voriconazole  Diabetes mellitus SSI mod, CBG q 4  Chronic leg pain home methadone 10mg  q 6hr with prn   RA Weaning stress dose steroids as above to home dose  Severe protein calorie malnutrition  RD consult   Global: patient is very high risk for further decompensation given his chronic co-morbidities, debility, and poor functional status.  I briefly discussed [place] with patient as he initially deferred conversation to his wife but I expressed he should be the one who decides what HE wants as we want to honor his wishes.  At this time he wants to remain a full code and wants everything done however he also is concerned with pain issues.  Have asked cardiology to consult given his HF and severe AS which will help further define his plan of care.  Patient would greatly benefit from PMT consult.     Best practice:  Diet: NPO, clear liquids- SLP evaluation  Pain/Anxiety/Delirium protocol (if indicated): chronic methadone VAP protocol (if indicated): n/a DVT prophylaxis: SCDs GI prophylaxis: Yes Glucose control: SSI Mobility: PT/ OT Code Status: Full Family Communication: patient updated on plan of care.  Will call wife with update.   Disposition: ICU- for another day         Kennieth Rad, MSN, AGACNP-BC [facility] 12/09/2019, 10:48 AM

## 2019-12-09 NOTE — Progress Notes (Signed)
[facility] NEPHROLOGY PROGRESS NOTE  Assessment/ Plan: Pt is a 68 y.o. yo male with history of RA, bronchiectasis, severe AS admitted from [facility] with shock, multiorgan failure and AKI.  #AKI, anuric: Suspect ATN from sepsis concomitant with acute cardiorenal syndrome/severe AS.  Currently on CRRT, this morning running even with low blood pressure.  Potassium level acceptable.  I will hold off on CRRT today and assess daily for intermittent HD need.  Watch for renal recovery.  Avoid nephrotoxins.  #Acute shock likely cardiogenic and sepsis: Currently off pressors.  On antibiotics per primary team.  #Acute respiratory failure, history of bronchiectasis: Extubated.  #Severe AS: Considering cardiology consult.  #Hyponatremia, hypervolemic: Sodium level improved.  #Anemia of critical illness: Monitor hemoglobin level.  Subjective:   Objective Vital signs in last 24 hours: Vitals:   12/09/19 0500 12/09/19 0600 12/09/19 0630 12/09/19 0700  BP: (!) 96/56  (!) 126/27 (!) 121/32  Pulse: 77  79 79  Resp: 13  16 11   Temp:      TempSrc:      SpO2: 100%  100% 100%  Weight:  79.1 kg    Height:       Weight change: 0 kg  Intake/Output Summary (Last 24 hours) at 12/09/2019 0831 Last data filed at 12/09/2019 0800 Gross per 24 hour  Intake 1090.37 ml  Output 2383 ml  Net -1292.63 ml       Labs: Basic Metabolic Panel: Recent Labs  Lab 12/08/19 0431 12/08/19 0521 12/08/19 1530 12/09/19 0451  NA 133* 134* 135 136  K 4.4 4.2 4.1 4.4  CL 98  --  101 102  CO2 24  --  26 25  GLUCOSE 217*  --  181* 118*  BUN 61*  --  46* 30*  CREATININE 2.46*  --  1.58* 1.16  CALCIUM 7.5*  --  7.6* 7.8*  PHOS 3.8  --  2.8 2.4*   Liver Function Tests: Recent Labs  Lab 12/07/19 0451 12/08/19 0431 12/08/19 1530 12/09/19 0451  AST 255* 159*  --  103*  ALT 336* 258*  --  208*  ALKPHOS 131* 134*  --  121  BILITOT 2.8* 2.8*  --  2.7*  PROT 5.7* 5.5*  --  5.4*  ALBUMIN 2.6* 2.5*  2.3* 2.4*   No results for input(s): LIPASE, AMYLASE in the last 168 hours. Recent Labs  Lab 12/09/2019 2018  AMMONIA 30   CBC: Recent Labs  Lab 12/18/2019 1922 12/06/19 0449 12/07/19 0451 12/08/19 0431 12/08/19 0521 12/09/19 0451  WBC 7.9 11.2* 10.6* 9.7  --  10.1  NEUTROABS 7.1  --  9.9*  --   --   --   HGB 9.1* 9.2* 9.5* 9.6* 10.2* 9.5*  HCT 29.8* 30.2* 30.1* 30.0* 30.0* 30.1*  MCV 99.0 97.7 95.6 95.5  --  96.8  PLT 108* 121* 122* 95*  --  69*   Cardiac Enzymes: No results for input(s): CKTOTAL, CKMB, CKMBINDEX, TROPONINI in the last 168 hours. CBG: Recent Labs  Lab 12/08/19 1119 12/08/19 1533 12/08/19 2013 12/08/19 2343 12/09/19 0348  GLUCAP 197* 163* 102* 85 101*    Iron Studies: No results for input(s): IRON, TIBC, TRANSFERRIN, FERRITIN in the last 72 hours. Studies/Results: DG Chest Port 1 View  Result Date: 12/09/2019 CLINICAL DATA:  68 year old male with weakness and respiratory failure negative for COVID-19. EXAM: PORTABLE CHEST 1 VIEW COMPARISON:  12/08/2019 and earlier. FINDINGS: Portable AP semi upright view at 0543 hours. Extubated. Enteric tube removed. Stable  right IJ central line. Stable left chest cardiac pacemaker. Stable lung volumes and mediastinal contours. Mildly improved bibasilar ventilation with residual patchy opacity. No pneumothorax, pulmonary edema or pleural effusion. Negative visible bowel gas pattern. IMPRESSION: 1. Extubated and enteric tube removed. 2. Stable lung volumes with improved ventilation, decreased atelectasis. Electronically Signed   By: Genevie Ann M.D.   On: 12/09/2019 08:11   DG CHEST PORT 1 VIEW  Result Date: 12/08/2019 CLINICAL DATA:  Follow-up for respiratory failure. EXAM: PORTABLE CHEST 1 VIEW COMPARISON:  12/07/2019 and older studies. FINDINGS: Right basilar atelectasis.  Remainder of the lungs is clear. No mediastinal widening. No pneumothorax or convincing pleural effusion. Endotracheal tube, right internal jugular  central venous line and nasal/orogastric tube as well as the left anterior chest wall pacemaker are stable. IMPRESSION: 1. No change from the previous day's study. 2. Persistent mild right lung base atelectasis. No new lung abnormalities. 3. Stable support apparatus. Electronically Signed   By: Lajean Manes M.D.   On: 12/08/2019 10:57   DG CHEST PORT 1 VIEW  Result Date: 12/07/2019 CLINICAL DATA:  Status post insertion of new catheter. EXAM: PORTABLE CHEST 1 VIEW COMPARISON:  December 06, 2019. FINDINGS: Stable cardiomegaly. Endotracheal and nasogastric tubes are unchanged in position. Interval placement of right internal jugular catheter with distal tip in expected position of SVC. Left-sided pacemaker is unchanged in position. No pneumothorax pleural effusion is noted. Mild right basilar atelectasis or infiltrate is noted. Bony thorax is unremarkable. IMPRESSION: Interval placement of right internal jugular catheter with distal tip in expected position of SVC. No pneumothorax is noted. Mild right basilar atelectasis or infiltrate is noted. Electronically Signed   By: Marijo Conception M.D.   On: 12/07/2019 14:03    Medications: Infusions: .  prismasol BGK 4/2.5 400 mL/hr at 12/09/19 0500  .  prismasol BGK 4/2.5 200 mL/hr at 12/08/19 1653  . sodium chloride    . heparin 10,000 units/ 20 mL infusion syringe 1,050 Units/hr (12/09/19 0730)  . heparin    . lactated ringers 10 mL/hr at 12/09/19 0700  . meropenem (MERREM) IV 1 g (12/09/19 0817)  . norepinephrine (LEVOPHED) Adult infusion Stopped (12/07/19 0932)  . phenylephrine (NEO-SYNEPHRINE) Adult infusion Stopped (12/06/19 2225)  . prismasol BGK 4/2.5 1,500 mL/hr at 12/09/19 0501  . vancomycin Stopped (12/09/19 0249)    Scheduled Medications: . B-complex with vitamin C  1 tablet Per Tube Daily  . chlorhexidine  15 mL Mouth Rinse BID  . Chlorhexidine Gluconate Cloth  6 each Topical Q2200  . hydrocortisone sod succinate (SOLU-CORTEF) inj  50  mg Intravenous Q12H  . insulin aspart  0-15 Units Subcutaneous Q4H  . mouth rinse  15 mL Mouth Rinse q12n4p  . methadone  10 mg Per Tube Q6H  . sodium chloride flush  10-40 mL Intracatheter Q12H  . sodium chloride flush  10-40 mL Intracatheter Q12H    have reviewed scheduled and prn medications.  Physical Exam: General:NAD, comfortable Heart:RRR, s1s2 nl Lungs:clear b/l, no crackle Abdomen:soft, Non-tender, non-distended Extremities:No edema Dialysis Access: Right IJ temporary catheter placed on 12/12.  Montae Stager Prasad Damarion Mendizabal 12/09/2019,8:31 AM  LOS: 3 days  Pager: [phone]

## 2019-12-09 NOTE — Evaluation (Signed)
Clinical/Bedside Swallow Evaluation Patient Details  Name: [name] MRN: [id number] Date of Birth: Jan 28, 1951  Today's Date: 12/09/2019 Time: SLP Start Time (ACUTE ONLY): 1017 SLP Stop Time (ACUTE ONLY): 0737 SLP Time Calculation (min) (ACUTE ONLY): 16 min  Past Medical History:  Past Medical History:  Diagnosis Date  . Aortic stenosis   . ARF (acute renal failure) ([place]) 09/11/2019  . Chronic back pain   . Chronic combined systolic (congestive) and diastolic (congestive) heart failure (HCC)    a. EF 30-35% in 2016 b. at 30-35% by echo in 08/2016 c. 06/2019: echo showing EF of 60-65% but found to have severe AS.   [name] Chronic neck pain   . CKD (chronic kidney disease), stage III   . Complete heart block ([place]) 08/29/2019   a. s/p Medtronic dual chamber pacemeker implantation on 08/30/2019.  [name] Coronary artery disease    a. s/p CABG in 06/2015 with LIMA-LAD, SVG-PDA, SVG-Intermediate  . Diabetes mellitus   . Diabetic neuropathy ([place])   . Hyperkalemia   . Hypertension   . Loculated pleural effusion 09/09/2016  . Pancytopenia ([place]) 08/13/2014  . 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  . Rheumatoid arthritis(714.0)   . Seizure ([place]) 08/13/2014   Pt denies   Past Surgical History:  Past Surgical History:  Procedure Laterality Date  . 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 CARDIAC SURGERY    . FRACTURE SURGERY    . MUSCLE BIOPSY  06/2016  . PACEMAKER IMPLANT N/A 08/30/2019   Procedure: PACEMAKER IMPLANT;  Surgeon: Evans Lance, MD;  Location: [facility];  Service: Cardiovascular;  Laterality: N/A;  . right foot surgery-Toe amputations    . RIGHT/LEFT HEART CATH AND CORONARY/GRAFT ANGIOGRAPHY N/A 09/03/2019   Procedure: RIGHT/LEFT HEART CATH AND CORONARY/GRAFT ANGIOGRAPHY;  Surgeon: Leonie Man, MD;  Location: [facility];  Service: Cardiovascular;  Laterality: N/A;  . TEMPORARY PACEMAKER N/A 08/29/2019    Procedure: TEMPORARY PACEMAKER;  Surgeon: Sherren Mocha, MD;  Location: [facility];  Service: Cardiovascular;  Laterality: N/A;  . TOTAL HIP ARTHROPLASTY Left    HPI:  [name] is a 68 y.o. male with medical history significant for rheumatoid arthritis, hypertension, coronary artery disease, diabetes mellitus, diastolic and systolic CHF, severe aortic stenosis, CKD 3, left above-knee amputation.  Admitted from [facility] with septic shock, multiorgan failure.  Pt has baseline dysphonia.  Wife reports they have seen a throat doctor (presume ENT) who told them muscles for vocal cords are weak. Pt has chronic cough at baseline as well per hx obtained by evaluating SLP at [facility]. Pt had MBSS in 2017 in [place] with aspiration noted with thin liquid, but no further instrumental swallow evaluation.  Assessment / Plan / Recommendation Clinical Impression  Pt presents with mild oral dysphagia c/b reduced labial seal and prolonged mastication.  Pt has baseline dysphonia, but was also recently intubated 12/11-12/13 (~43 hours).  Wife reports that current vocal quality is consistent with baseline. CXR 12/14 shows "mildly improved bibasilar ventilation withresidual patchy opacity" presumed to be atalectasis. With PO trials, there was anterior spillage of thin liquid from R side of oral cavity on 2 trials of thin liquid early in assessment, but pt was able to orally contain additional trials.  Pt tolerated thin liquid, puree, and solids with no clinical s/s of aspiration.  Wife arrived and had dentures and these were placed for solid  trials.  Top dentures were noted to be a little loose and fell a few times, but pt appeared able to get the plate placed fairly securely. There was prolonged oral phase with regular solids and trace-mild oral residue, this was somewhat improved with placement of dentures.  Oral phase was more efficient with soft solids.  Recommend mechanical soft solids and thin liquids.  Please place  dentures for PO intake. SLP to follow for diet tolerance.  SLP Visit Diagnosis: Dysphagia, unspecified (R13.10)    Aspiration Risk  Mild aspiration risk    Diet Recommendation Dysphagia 3 (Mech soft);Thin liquid   Liquid Administration via: Cup;Straw Medication Administration: Whole meds with liquid Supervision: Staff to assist with self feeding Compensations: Minimize environmental distractions;Slow rate;Small sips/bites(dentures in place for PO intake) Postural Changes: Seated upright at 90 degrees    Other  Recommendations Oral Care Recommendations: Oral care BID   Follow up Recommendations (TBD)      Frequency and Duration min 2x/week  2 weeks       Prognosis Prognosis for Safe Diet Advancement: [place] Study   General Date of Onset: 12/16/2019 HPI: [name] is a 68 y.o. male with medical history significant for rheumatoid arthritis, hypertension, coronary artery disease, diabetes mellitus, diastolic and systolic CHF, severe aortic stenosis, CKD 3, left above-knee amputation.  Admitted from [facility] with septic shock, multiorgan failure.  Pt has baseline dysphonia.  Wife reports they have seen a throat doctor (presume ENT) who told them muscles for vocal cords are weak. Pt has chronic cough at baseline as well per hx obtained by evaluating SLP at [facility]. Type of Study: Bedside Swallow Evaluation Previous Swallow Assessment: BSE 12/06/19 at [facility].  MBS in 2017 at [place] Prior to this Study: NPO Temperature Spikes Noted: No History of Recent Intubation: Yes Length of Intubations (days): 2 days Date extubated: 12/08/19 Behavior/Cognition: Alert;Cooperative;Pleasant mood Oral Cavity Assessment: Dry Oral Cavity - Dentition: Dentures, bottom;Dentures, top Self-Feeding Abilities: Total assist Patient Positioning: Upright in bed Baseline Vocal Quality: Hoarse;Low vocal intensity(Harsh/Gravely) Volitional Swallow: Unable to elicit    Oral/Motor/Sensory Function Overall  Oral Motor/Sensory Function: Mild impairment Facial ROM: Within Functional Limits Facial Symmetry: Within Functional Limits Lingual ROM: Within Functional Limits(with encouragement) Lingual Symmetry: Within Functional Limits Lingual Strength: Within Functional Limits Velum: Within Functional Limits Mandible: Within Functional Limits   Ice Chips Ice chips: Not tested   Thin Liquid Thin Liquid: Impaired Presentation: Straw;Cup Oral Phase Functional Implications: Right anterior spillage    Nectar Thick Nectar Thick Liquid: Not tested   Honey Thick Honey Thick Liquid: Not tested   Puree Puree: Within functional limits Presentation: Spoon   Solid     Solid: Impaired Oral Phase Functional Implications: Prolonged oral transit;Oral residue      Celedonio Savage, MA, [place] Office: [phone] 12/09/2019,10:54 AM

## 2019-12-09 NOTE — Progress Notes (Signed)
Nutrition Follow up   DOCUMENTATION CODES:   Not applicable  INTERVENTION:  -d/c b complex with vitamin C   Ensure Enlive po BID, each supplement provides 350 kcal and 20 grams of protein  MVI daily   NUTRITION DIAGNOSIS:   Inadequate oral intake related to acute illness as evidenced by NPO status.  Diet advanced   GOAL:   Patient will meet greater than or equal to 90% of their needs   Progressing   MONITOR:   Vent status, Labs, Weight trends, TF tolerance, Skin  REASON FOR ASSESSMENT:   Ventilator    ASSESSMENT:   68 yo male admitted with shock, likely combination of septic and cardiogenic, anuric AKI with hyperkalemic and metabolic acidosis. PMH includes CKD III, CAd/CABG, PVD s/p L. BKA, DM, HTN, CHF   12/13- extubated   Pt discussed during ICU rounds and with RN.   CRRT off today to monitor for renal recovery. Off pressors. Pt slightly confused upon follow up. Unable to obtain history. Diet advanced to DYS 3 with thin liquids. RD to provide supplementation to maximize kcal and protein.   Admission weight: 82 kg  Current weight: 79.1 kg    I/O: +2,175 ml since admit UOP: 25 ml x 24 hrs   Medications: b complex vitamin c, SS novolog Labs: Phosphorus 2.4 (L) Mg 2.7 (H) LFTs elevated   Diet Order:   Diet Order            DIET DYS 3 Room service appropriate? Yes; Fluid consistency: Thin  Diet effective now              EDUCATION NEEDS:   Not appropriate for education at this time  Skin:  Skin Assessment: Skin Integrity Issues: Skin Integrity Issues:: Stage II, Stage I Stage I: toe Stage II: sacrum  Last BM:  12/14  Height:   Ht Readings from Last 1 Encounters:  12/08/19 5\' 8"  (1.727 m)    Weight:   Wt Readings from Last 1 Encounters:  12/09/19 79.1 kg    Ideal Body Weight:  65.9 kg (left BKA)  BMI:  Body mass index is 26.51 kg/m.  Estimated Nutritional Needs:   Kcal:  1700-1900 kcal  Protein:  85-100 grams  Fluid:  >/=  1.7 L/day   Mariana Single RD, LDN Clinical Nutrition Pager # - [phone]

## 2019-12-09 NOTE — Progress Notes (Signed)
Patient's with Rt IJ HD catheter and Rt femoral CVC - both of which have been oozing blood since this AM. CCM aware and multiple new orders received. Both dressings changed using thrombipad, gauze, and tegaderm. Dressings will be due to be changed tomorrow.   Joellen Jersey, RN

## 2019-12-10 ENCOUNTER — Encounter (HOSPITAL_COMMUNITY): Payer: Self-pay | Admitting: Internal Medicine

## 2019-12-10 DIAGNOSIS — I5032 Chronic diastolic (congestive) heart failure: Secondary | ICD-10-CM

## 2019-12-10 DIAGNOSIS — Z515 Encounter for palliative care: Secondary | ICD-10-CM

## 2019-12-10 DIAGNOSIS — J9600 Acute respiratory failure, unspecified whether with hypoxia or hypercapnia: Secondary | ICD-10-CM

## 2019-12-10 DIAGNOSIS — R579 Shock, unspecified: Secondary | ICD-10-CM

## 2019-12-10 DIAGNOSIS — Z7189 Other specified counseling: Secondary | ICD-10-CM

## 2019-12-10 DIAGNOSIS — I2510 Atherosclerotic heart disease of native coronary artery without angina pectoris: Secondary | ICD-10-CM

## 2019-12-10 LAB — APTT: aPTT: 31 seconds (ref 24–36)

## 2019-12-10 LAB — COMPREHENSIVE METABOLIC PANEL
ALT: 172 U/L — ABNORMAL HIGH (ref 0–44)
AST: 78 U/L — ABNORMAL HIGH (ref 15–41)
Albumin: 2.5 g/dL — ABNORMAL LOW (ref 3.5–5.0)
Alkaline Phosphatase: 121 U/L (ref 38–126)
Anion gap: 8 (ref 5–15)
BUN: 43 mg/dL — ABNORMAL HIGH (ref 8–23)
CO2: 26 mmol/L (ref 22–32)
Calcium: 8.3 mg/dL — ABNORMAL LOW (ref 8.9–10.3)
Chloride: 99 mmol/L (ref 98–111)
Creatinine, Ser: 2.08 mg/dL — ABNORMAL HIGH (ref 0.61–1.24)
GFR calc Af Amer: 36 mL/min — ABNORMAL LOW (ref >60)
GFR calc non Af Amer: 31 mL/min — ABNORMAL LOW (ref >60)
Glucose, Bld: 153 mg/dL — ABNORMAL HIGH (ref 70–99)
Potassium: 4.9 mmol/L (ref 3.5–5.1)
Sodium: 133 mmol/L — ABNORMAL LOW (ref 135–145)
Total Bilirubin: 2.3 mg/dL — ABNORMAL HIGH (ref 0.3–1.2)
Total Protein: 5.6 g/dL — ABNORMAL LOW (ref 6.5–8.1)

## 2019-12-10 LAB — CULTURE, BLOOD (ROUTINE X 2)
Culture: NO GROWTH
Culture: NO GROWTH
Special Requests: ADEQUATE
Special Requests: ADEQUATE

## 2019-12-10 LAB — VANCOMYCIN, RANDOM: Vancomycin Rm: 18

## 2019-12-10 LAB — CBC
HCT: 29.7 % — ABNORMAL LOW (ref 39.0–52.0)
Hemoglobin: 9.3 g/dL — ABNORMAL LOW (ref 13.0–17.0)
MCH: 31.2 pg (ref 26.0–34.0)
MCHC: 31.3 g/dL (ref 30.0–36.0)
MCV: 99.7 fL (ref 80.0–100.0)
Platelets: 72 10*3/uL — ABNORMAL LOW (ref 150–400)
RBC: 2.98 MIL/uL — ABNORMAL LOW (ref 4.22–5.81)
RDW: 20.6 % — ABNORMAL HIGH (ref 11.5–15.5)
WBC: 11.6 10*3/uL — ABNORMAL HIGH (ref 4.0–10.5)
nRBC: 0.4 % — ABNORMAL HIGH (ref 0.0–0.2)

## 2019-12-10 LAB — GLUCOSE, CAPILLARY
Glucose-Capillary: 138 mg/dL — ABNORMAL HIGH (ref 70–99)
Glucose-Capillary: 78 mg/dL (ref 70–99)
Glucose-Capillary: 105 mg/dL — ABNORMAL HIGH (ref 70–99)
Glucose-Capillary: 134 mg/dL — ABNORMAL HIGH (ref 70–99)
Glucose-Capillary: 233 mg/dL — ABNORMAL HIGH (ref 70–99)
Glucose-Capillary: 249 mg/dL — ABNORMAL HIGH (ref 70–99)

## 2019-12-10 LAB — MAGNESIUM: Magnesium: 3 mg/dL — ABNORMAL HIGH (ref 1.7–2.4)

## 2019-12-10 LAB — PHOSPHORUS: Phosphorus: 3.6 mg/dL (ref 2.5–4.6)

## 2019-12-10 MED ORDER — INSULIN ASPART 100 UNIT/ML ~~LOC~~ SOLN
0.0000 [IU] | Freq: Every day | SUBCUTANEOUS | Status: DC
  Administered 2019-12-10: 2 [IU] via SUBCUTANEOUS
  Administered 2019-12-11: 3 [IU] via SUBCUTANEOUS

## 2019-12-10 MED ORDER — INSULIN ASPART 100 UNIT/ML ~~LOC~~ SOLN
0.0000 [IU] | Freq: Three times a day (TID) | SUBCUTANEOUS | Status: DC
  Administered 2019-12-10: 1 [IU] via SUBCUTANEOUS
  Administered 2019-12-11 (×2): 3 [IU] via SUBCUTANEOUS
  Administered 2019-12-11: 5 [IU] via SUBCUTANEOUS
  Administered 2019-12-12 (×2): 2 [IU] via SUBCUTANEOUS
  Administered 2019-12-12: 1 [IU] via SUBCUTANEOUS
  Administered 2019-12-13: 5 [IU] via SUBCUTANEOUS
  Administered 2019-12-13: 1 [IU] via SUBCUTANEOUS
  Administered 2019-12-13: 09:00:00 2 [IU] via SUBCUTANEOUS

## 2019-12-10 MED ORDER — METHADONE HCL 10 MG PO TABS
10.0000 mg | ORAL_TABLET | Freq: Four times a day (QID) | ORAL | Status: DC
  Administered 2019-12-11 – 2019-12-17 (×23): 10 mg via ORAL
  Filled 2019-12-10 (×24): qty 1

## 2019-12-10 MED ORDER — HEPARIN SODIUM (PORCINE) 5000 UNIT/ML IJ SOLN
5000.0000 [IU] | Freq: Three times a day (TID) | INTRAMUSCULAR | Status: DC
  Administered 2019-12-10 (×2): 5000 [IU] via SUBCUTANEOUS
  Filled 2019-12-10 (×4): qty 1

## 2019-12-10 MED ORDER — HYDROXYCHLOROQUINE SULFATE 200 MG PO TABS
200.0000 mg | ORAL_TABLET | Freq: Every day | ORAL | Status: DC
  Administered 2019-12-10 – 2019-12-17 (×8): 200 mg via ORAL
  Filled 2019-12-10 (×8): qty 1

## 2019-12-10 MED ORDER — FUROSEMIDE 10 MG/ML IJ SOLN
80.0000 mg | Freq: Once | INTRAMUSCULAR | Status: AC
  Administered 2019-12-10: 80 mg via INTRAVENOUS
  Filled 2019-12-10: qty 8

## 2019-12-10 MED ORDER — VANCOMYCIN HCL IN DEXTROSE 750-5 MG/150ML-% IV SOLN
750.0000 mg | Freq: Once | INTRAVENOUS | Status: AC
  Administered 2019-12-10: 750 mg via INTRAVENOUS
  Filled 2019-12-10: qty 150

## 2019-12-10 MED ORDER — POLYVINYL ALCOHOL 1.4 % OP SOLN
1.0000 [drp] | OPHTHALMIC | Status: DC | PRN
  Administered 2019-12-10: 1 [drp] via OPHTHALMIC
  Filled 2019-12-10: qty 15

## 2019-12-10 NOTE — Progress Notes (Signed)
NAME:  [name], MRN:  [id number], DOB:  27-Jun-1951, LOS: 4 ADMISSION DATE:  11/30/2019, CONSULTATION DATE:  12/07/2019 REFERRING MD:  TRH, CHIEF COMPLAINT: Altered mental status  Brief History   Septic 68 yo male with hx of RA, HFrEF, severe AS, bronchiectasis.  Admitted from [facility] with shock, multiorgan failure.  Initiated on CRRT. Rapid improvement.  Extubated 12/13 and CRRT stopping 12/14.  Past Medical History  Rheumatoid arthritis, hypertension, coronary artery disease, diabetes mellitus, diastolic and systolic CHF, severe aortic stenosis, complete heart block status post pacer, CKD 3, PAD s/p left above-knee amputation, loculated pleural effusion  Recently started on voriconazole in early December 2020 for Candida tropicalis infection/colonization of lung.   Significant Hospital Events   12/10 > Admitted to [facility] 12/11 > Transferred to [facility] 12/12 > Initiated CVVH, off pressors 12/13 > extubated  12/14 > stopping CRRT  Consults:  Nephrology PCCM  Procedures:  ETT 12/11 >> 12/13 Right femoral CVL 12/11 >> Right IJ HD catheter 12/12 >>  Significant Diagnostic Tests:  12/10 [facility] >>chronic small vessel disease without acute intracranial abnormality  12/10 CT a/p >> 1. Mild gastric distension with air-fluid level, can be seen with gastroenteritis or gastroparesis. No gastric wall thickening or evidence of gastric outlet obstruction. 2. Small volume of ascites in the abdomen and pelvis, new from prior CT. Mild body wall edema. 3. Large stool burden with colonic tortuosity, suggesting constipation. No bowel obstruction. 4. Bilateral pleural effusions. Irregular streaky consolidative airspace opacities in both lower lobes, increased from CT last month. Pneumonia or aspiration considered. 5. Severe aortic and branch atherosclerosis. Aortic Atherosclerosis   Micro Data:  12/10 SARS2 >> neg 12/11 MRSA PCR >> neg 12/11 RVP >> neg 12/10 UC >> neg 12/10 BCx 2  >> 12/11 trach asp >> MRSA and pseudomonas  Antimicrobials:  Ceftriaxone 12/10 >>12/11 Doxycycline 12/10  >> 12/11 Vancomycin 12/11 >> Meropenem 12/12 >> Anidulafungin 1 dose 12/11  Interim history/subjective:  No events, bleeding has eased up. Patient denies pain. Remains profoundly weak which is his baseline.  Objective   Blood pressure 100/60, pulse 84, temperature 98.6 F (37 C), temperature source Oral, resp. rate 11, height 5\' 8"  (1.727 m), weight 79.8 kg, SpO2 100 %. CVP:  [20 mmHg] 20 mmHg      Intake/Output Summary (Last 24 hours) at 12/10/2019 0841 Last data filed at 12/10/2019 1884 Gross per 24 hour  Intake 788.53 ml  Output 110 ml  Net 678.53 ml   Filed Weights   12/08/19 2000 12/09/19 0600 12/10/19 0500  Weight: 80.5 kg 79.1 kg 79.8 kg   Examination: GEN: frail elderly contracted man lying in bed HEENT: MMM, poor denttion CV: +murmur, ext warm PULM: Diminished at bases with crackles, no accessory muscle use GI: Soft, +BS EXT: Diffuse anasarca NEURO: Contracted upper ext, profoundly weak PSYCH: AOx3 poor insight SKIN: scattered bruising  Only 35 cc UoP Cr up CBC pending   Resolved Hospital Problem list   NA  Assessment & Plan:  # Multiorgan failure, I guess we can call this pseudomonas and MRSA tracheobronchitis vs. pneumonia # Shock liver improved # Shock renal failure improved but not resolved # Shock respiratory failure improved # Reactve thrombocytopenia, coagulopathy I suspect more malnutrition anything else # Frail elderly # Dysphagia # Bronchiectasis with C tropicalis infection vs colonization followed by Dr. Carlis Abbott # Severe protein calorie malnutrition # RA on plaquenil and chronic prenisone # Severe valvular heart disease with unclear candidacy for TAVR  -  Continue to wean stress steroids -Vancomycin and meropenem x10 days -Continue CPT and hypertonic nebs -Follow-up a.m. CBC -Follow-up structural heart recommendations regarding  candidacy for TAVR -Follow-up palliative care consult -Dysphagia 3 diet with supervision -Remove femoral line -Resume plaquenil -Lasix trial per nephrology otherwise may need to go back on CRRT -Hold off on further voriconazole, will discuss with Dr. Carlis Abbott - PT/OT, up to chair   Best practice:  Diet: Dysphagia 3 Pain/Anxiety/Delirium protocol (if indicated): chronic methadone VAP protocol (if indicated): n/a DVT prophylaxis: Resume heparin GI prophylaxis: Yes Glucose control: SSI Mobility: PT/ OT Code Status: Full Family Communication: will update later today Disposition: Keep in ICU pending blood pressure tolerance of diuretics plus/minus resumption of CRRT    The patient is critically ill with multiple organ systems failure and requires high complexity decision making for assessment and support, frequent evaluation and titration of therapies, application of advanced monitoring technologies and extensive interpretation of multiple databases. Critical Care Time devoted to patient care services described in this note independent of APP/resident time (if applicable)  is 35 minutes.   Erskine Emery MD [facility] 12/10/2019 8:57 AM Personal pager: [phone] If unanswered, please page CCM On-call: [phone]

## 2019-12-10 NOTE — Consult Note (Signed)
Consultation Note Date: 12/10/2019   Patient Name: [name]  DOB: 1951-01-10  MRN: [id number]  Age / Sex: 68 y.o., male  PCP: [name], MD Referring Physician: Candee Furbish, MD  Reason for Consultation: Establishing goals of care  HPI/Patient Profile: 68 y.o. 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 Extubated 12/13, CRRT stopped 12/14. Being treated with antibiotics for pseudomonas and MRSA tracheobronchitis vs. Pneumonia. Shock respiratory failure/liver improved. Patient's kidney function worsening on 12/15. Nephrology following. Plan for Lasix 62m IV today and concern that he may need intermittent hemodialysis. Patient with severe aortic stenosis. Cardiology following. TAVR team evaluated and given patient's multiple co-morbidities, he is not felt to be a candidate for TAVR. Recommending palliative medicine consultation for goals of care.   Clinical Assessment and Goals of Care:  I have reviewed medical records, discussed with care team and met with patient and wife ([name] at bedside to discuss diagnosis, GManning EOL wishes, disposition and options.  Introduced Palliative Medicine as specialized medical care for people living with serious illness. It focuses on providing relief from the symptoms and stress of a serious illness. The goal is to improve quality of life for both the patient and the family.  We discussed an extensive life review of the patient. JAzelhas been married to MCokatofor 50 years. They are high school sweethearts. They have two children and four grandchildren. Family is very important to them. 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   Extensively discussed chronic health conditions including diabetes, neuropathy, s/p amputation, multiple heart attacks, CABG, RA. MJana Halfshares that JLandershas been critically ill multiple times in his life--but has always survived. 'He's tough.' [facility] but he proved them wrong. Two years ago, he was critically ill requiring dialysis. After 9 weeks of intermittent dialysis, his kidneys "made a 360" and completely recovered therefore dialysis was discontinued.   Discussed events leading up to hospitalization and course of hospitalization including diagnoses, interventions, plan of care. Discussed concern with severe aortic stenosis (not a candidate for TAVR therefore not a fixable problem) and also worsening creatine today, concern that JErmonmight need further dialysis. Also that he may not tolerate dialysis with very weak heart. JAntwoneand MJana Halfacknowledge their understanding that he is not a candidate for TAVR at this time, but seem to remain hopeful that if he improves from acute illness, he may be a candidate down the road.   I attempted to elicit values and goals of care important to the patient and wife. Advanced directives, concepts specific to code status, artifical feeding and hydration were discussed. MJana Halfshares that he does NOT have a documented  living will and they have not discussed his wishes if not getting better. She shares that this would need to be a conversation with their children.   [name] is very adamant on the fact that Franciso and her wish for FULL code/FULL scope treatment. She emphasizes that she told the doctors at [facility] ED to do "absolutely everything possible to keep him alive." Further explained concern with need for future dialysis. Patient understands and is willing to pursue dialysis three days a week if necessary to live  longer. Again explained that he may not tolerate with very weak heart.   Encouraged patient and wife to continue conversations regarding his goals and wishes, especially since he is cognizant to make decisions for himself. Encouraged he consider what quality of life looks like and at what point would 'enough be enough.' Wife and husband do speak of the importance for him to return home following hospitalization. Wife is an excellent caregiver and plans to continue providing 24/7 care.   Palliative Care services outpatient were explained and offered. Reassured of ongoing palliative support this admit.   Questions and concerns were addressed.  Hard Choices booklet left for review. PMT contact information given.    SUMMARY OF RECOMMENDATIONS     Patient and wife adamant for ongoing FULL code/FULL scope treatment. Patient/wife wish for all offered and available medical interventions including future dialysis if necessary to live longer.   Patient/wife acknowledge understanding that he is not a candidate for TAVR, but seem to remain hopeful that if he improves from current acute illness, he may be a candidate in the future.   Patient with multiple chronic conditions for many years and has also been critically ill multiple times in the past. Jove has continued to survive and surprise providers. This is why he and wife continue to desire FULL code/FULL scope treatment.   Encouraged ongoing discussions regarding goals/wishes. Encouraged consideration of advance directive completion.   May benefit from outpatient palliative referral. Patient/wife do not want SNF following hospitalization. It is important for patient to return home when discharged.   Code Status/Advance Care Planning:  Full code  Symptom Management:   Per attending  Palliative Prophylaxis:   Aspiration, Delirium Protocol, Frequent Pain Assessment, Oral Care and Turn Reposition  Psycho-social/Spiritual:   Desire for further  Chaplaincy support: yes  Additional Recommendations: Caregiving  Support/Resources and Compassionate Wean Education  Prognosis:   Poor long-term prognosis  Discharge Planning: To Be Determined      Primary Diagnoses: Present on Admission: . AKI (acute kidney injury) ([place]) . Chronic diastolic heart failure ([place]) . Coronary artery disease . Rheumatoid arthritis ([place]) . LBBB (left bundle branch block) . DM (diabetes mellitus) type II controlled with renal manifestation ([place]) . (Resolved) Peripheral artery disease ([place]) . Hyperlipidemia . Pacemaker . Type 2 diabetes mellitus with peripheral vascular disease ([place])   I have reviewed the medical record, interviewed the patient and family, and examined the patient. The following aspects are pertinent.  Past Medical History:  Diagnosis Date  . Aortic stenosis   . ARF (acute renal failure) ([place]) 09/11/2019  . Chronic back pain   . Chronic combined systolic (congestive) and diastolic (congestive) heart failure (HCC)    a. EF 30-35% in 2016 b. at 30-35% by echo in 08/2016 c. 06/2019: echo showing EF of 60-65% but found to have severe AS.   [name] Chronic neck pain   . CKD (chronic kidney disease), stage III   . Complete heart block ([place]) 08/29/2019  a. s/p Medtronic dual chamber pacemeker implantation on 08/30/2019.  [name] Coronary artery disease    a. s/p CABG in 06/2015 with LIMA-LAD, SVG-PDA, SVG-Intermediate  . Diabetes mellitus   . Diabetic neuropathy ([place])   . Hyperkalemia   . Hypertension   . Loculated pleural effusion 09/09/2016  . Pancytopenia ([place]) 08/13/2014  . 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  . Rheumatoid arthritis(714.0)   . Seizure ([place]) 08/13/2014   Pt denies   Social History   Socioeconomic History  . Marital status: Married    Spouse name: Not on file  . Number of children: 2  . Years of education: Not on file  . Highest education level: Not on file   Occupational History  . Occupation: Retired Architect  Tobacco Use  . Smoking status: Never Smoker  . Smokeless tobacco: Never Used  Substance and Sexual Activity  . Alcohol use: No  . Drug use: No  . Sexual activity: Yes  Other Topics Concern  . Not on file  Social History Narrative  . Not on file   Social Determinants of Health   Financial Resource Strain:   . Difficulty of Paying Living Expenses: Not on file  Food Insecurity:   . Worried About Charity fundraiser in the Last Year: Not on file  . Ran Out of Food in the Last Year: Not on file  Transportation Needs:   . Lack of Transportation (Medical): Not on file  . Lack of Transportation (Non-Medical): Not on file  Physical Activity:   . Days of Exercise per Week: Not on file  . Minutes of Exercise per Session: Not on file  Stress:   . Feeling of Stress : Not on file  Social Connections:   . Frequency of Communication with Friends and Family: Not on file  . Frequency of Social Gatherings with Friends and Family: Not on file  . Attends Religious Services: Not on file  . Active Member of Clubs or Organizations: Not on file  . Attends Archivist Meetings: Not on file  . Marital Status: Not on file   Family History  Problem Relation Age of Onset  . Diabetes Father   . Alzheimer's disease Father   . Heart attack Brother        multiple brothers with MIs  . COPD Brother   . Hypertension Mother   . Stroke Mother   . Head & neck cancer Nephew    Scheduled Meds: . chlorhexidine  15 mL Mouth Rinse BID  . Chlorhexidine Gluconate Cloth  6 each Topical Q2200  . feeding supplement (ENSURE ENLIVE)  237 mL Oral BID BM  . heparin injection (subcutaneous)  5,000 Units Subcutaneous Q8H  . hydrocortisone sod succinate (SOLU-CORTEF) inj  50 mg Intravenous Daily  . hydroxychloroquine  200 mg Oral Daily  . insulin aspart  0-9 Units Subcutaneous TID WC  . mouth rinse  15 mL Mouth Rinse q12n4p  . [START ON 12/11/2019]  methadone  10 mg Oral Q6H  . multivitamin with minerals  1 tablet Oral Daily  . sodium chloride flush  10-40 mL Intracatheter Q12H  . sodium chloride flush  10-40 mL Intracatheter Q12H  . sodium chloride HYPERTONIC  4 mL Nebulization BID  . vancomycin variable dose per unstable renal function (pharmacist dosing)   Does not apply See admin instructions   Continuous Infusions: . sodium chloride    . ceFEPime (MAXIPIME) IV 1 g (12/10/19 1216)  .  heparin    . lactated ringers Stopped (12/10/19 0738)   PRN Meds:.sodium chloride, acetaminophen **OR** acetaminophen, Gerhardt's butt cream, heparin, methadone, naLOXone (NARCAN)  injection, polyethylene glycol, polyvinyl alcohol, sodium chloride flush, sodium chloride flush Medications Prior to Admission:  Prior to Admission medications   Medication Sig Start Date End Date Taking? Authorizing Provider  amitriptyline (ELAVIL) 50 MG tablet TAKE 1 AND 1/2 TABLETS BY MOUTH AT BEDTIME. Patient taking differently: Take 75 mg by mouth at bedtime.  05/06/19  Yes [name], MD  aspirin EC 81 MG tablet Take 81 mg by mouth daily.   Yes [provider]  atorvastatin (LIPITOR) 80 MG tablet TAKE ONE TABLET BY MOUTH ONCE DAILY. Patient taking differently: Take 80 mg by mouth daily.  06/03/19  Yes [name], MD  Cholecalciferol (VITAMIN D3) 25 MCG (1000 UT) CHEW Chew 1,000 Units by mouth daily.    Yes [provider]  clonazePAM (KLONOPIN) 0.25 MG disintegrating tablet DISSOLVE 1 TABLET BY MOUTH DAILY AS NEEDED FOR ANXIETY. Patient taking differently: Take 0.25 mg by mouth daily as needed (for anxiety- dissolve in the mouth).  07/22/19  Yes [name], MD  docusate sodium (COLACE) 100 MG capsule Take 100 mg by mouth 3 (three) times daily.    Yes [provider]  DULoxetine (CYMBALTA) 30 MG capsule Take 30 mg by mouth at bedtime.  10/16/18 12/19/2019 Yes [provider]  finasteride (PROSCAR) 5 MG tablet Take 5 mg  by mouth daily.  04/24/18  Yes [provider]  hydroxychloroquine (PLAQUENIL) 200 MG tablet Take 200 mg by mouth daily.   Yes [provider]  insulin aspart protamine- aspart (NOVOLOG MIX 70/30) (70-30) 100 UNIT/ML injection Inject 12 units into skin in the morning  and 8 units evening Patient taking differently: Inject 8-12 Units into the skin See admin instructions. Inject 12 units into the skin in the morning and 8 units in the evening 05/06/19  Yes [name], MD  methadone (DOLOPHINE) 10 MG tablet Take 10 mg by mouth every 6 (six) hours.  04/18/18  Yes [provider]  methadone (DOLOPHINE) 5 MG tablet Take 5 mg by mouth 3 (three) times daily as needed (for breakthrough pain).  04/18/18  Yes [provider]  Polyvinyl Alcohol-Povidone (REFRESH OP) Place 1 drop into both eyes 3 (three) times daily as needed (for dry eyes).    Yes [provider]  predniSONE (DELTASONE) 5 MG tablet TAKE 1 TABLET BY MOUTH ONCE A DAY WITH BREAKFAST. Patient taking differently: Take 5 mg by mouth daily with breakfast.  06/03/19  Yes [name], MD  pregabalin (LYRICA) 25 MG capsule TAKE ONE CAPSULE BY MOUTH AT BEDTIME. Patient taking differently: Take 25 mg by mouth at bedtime.  06/03/19  Yes [name], MD  sodium chloride HYPERTONIC 3 % nebulizer solution Take by nebulization 2 (two) times daily. 10/31/19  Yes Noemi Chapel P, DO  tamsulosin (FLOMAX) 0.4 MG CAPS capsule Take 1 capsule (0.4 mg total) by mouth at bedtime. 05/06/19  Yes [name], MD  torsemide (DEMADEX) 10 MG tablet TAKE 2 TABLETS BY MOUTH EVERY OTHER DAY. Patient taking differently: Take 20 mg by mouth every other day.  05/06/19  Yes [name], MD  voriconazole (VFEND) 200 MG tablet Take 1 tablet (200 mg total) by mouth 2 (two) times daily. 10/31/19  Yes Noemi Chapel P, DO  blood glucose meter kit and supplies KIT Dispense based on patient  and insurance preference. Use up to 3  times daily as directed. DX:E11.9 08/21/19   [name], MD  nitroGLYCERIN (NITROSTAT) 0.4 MG SL tablet Place 1 tablet (0.4 mg total) under the tongue every 5 (five) minutes x 3 doses as needed for chest pain. 09/04/19   [facility], Tami Lin, PA  Respiratory Therapy Supplies (FLUTTER) DEVI 1 each by Does not apply route 2 (two) times daily. 10/31/19   Julian Hy, DO   Allergies  Allergen Reactions  . Sulfa Antibiotics Rash   Review of Systems  All other systems reviewed and are negative.  Physical Exam Vitals and nursing note reviewed.  Constitutional:      General: He is awake.     Appearance: He is cachectic. He is ill-appearing.  HENT:     Head: Normocephalic and atraumatic.  Cardiovascular:     Rate and Rhythm: Normal rate.  Pulmonary:     Effort: No tachypnea, accessory muscle usage or respiratory distress.  Skin:    General: Skin is warm and dry.  Neurological:     Mental Status: He is alert and oriented to person, place, and time.  Psychiatric:        Mood and Affect: Mood normal.        Speech: Speech normal.        Behavior: Behavior normal.        Cognition and Memory: Cognition normal.    Vital Signs: BP 96/77   Pulse 62   Temp 98.5 F (36.9 C) (Oral)   Resp 17   Ht '5\' 8"'  (1.727 m)   Wt 79.8 kg   SpO2 100%   BMI 26.75 kg/m  Pain Scale: 0-10   Pain Score: 7    SpO2: SpO2: 100 % O2 Device:SpO2: 100 % O2 Flow Rate: .O2 Flow Rate (L/min): 1 L/min  IO: Intake/output summary:   Intake/Output Summary (Last 24 hours) at 12/10/2019 2138 Last data filed at 12/10/2019 1900 Gross per 24 hour  Intake 376.03 ml  Output 60 ml  Net 316.03 ml    LBM: Last BM Date: 12/09/19 Baseline Weight: Weight: 82 kg Most recent weight: Weight: 79.8 kg     Palliative Assessment/Data: PPS 40%   Flowsheet Rows     Most Recent Value  Intake Tab  Referral Department  Critical care  Unit at Time of Referral  ICU  Palliative Care Primary Diagnosis  Cardiac   Palliative Care Type  New Palliative care  Reason for referral  Clarify Goals of Care  Date first seen by Palliative Care  12/10/19  Clinical Assessment  Palliative Performance Scale Score  40%  Psychosocial & Spiritual Assessment  Palliative Care Outcomes  Patient/Family meeting held?  Yes  Who was at the meeting?  patient and wife  Palliative Care Outcomes  Clarified goals of care, Provided psychosocial or spiritual support, ACP counseling assistance      Time In/Out: 0920-0935, 1340-1500 Time Total: 47mn Greater than 50%  of this time was spent counseling and coordinating care related to the above assessment and plan.  Signed by:  MIhor Dow DNP, FNP-C Palliative Medicine Team  Phone: [phone]: [phone]  Please contact Palliative Medicine Team phone at [phone] questions and concerns.  For individual provider: See AShea Evans

## 2019-12-10 NOTE — Progress Notes (Signed)
PT Cancellation Note  Patient Details Name: RYON LAYTON MRN: [id number] DOB: 01-Aug-1951   Cancelled Treatment:    Reason Eval/Treat Not Completed: Medical issues which prohibited therapy. PT remains with femoral line at this time, preventing functional mobility. PT will hold until femoral line removed and pt is able to fully participate in skilled PT evaluation.   Zenaida Niece 12/10/2019, 12:02 PM

## 2019-12-10 NOTE — Progress Notes (Addendum)
Progress Note  Patient Name: [name] Date of Encounter: 12/10/2019  Primary Cardiologist: Carlyle Dolly, MD   Subjective   More alert this morning.  Eating.  No specific complaints.  Denies orthopnea, chest pain, and abdominal pain.  Inpatient Medications    Scheduled Meds:  chlorhexidine  15 mL Mouth Rinse BID   Chlorhexidine Gluconate Cloth  6 each Topical Q2200   feeding supplement (ENSURE ENLIVE)  237 mL Oral BID BM   furosemide  80 mg Intravenous Once   heparin injection (subcutaneous)  5,000 Units Subcutaneous Q8H   hydrocortisone sod succinate (SOLU-CORTEF) inj  50 mg Intravenous Daily   hydroxychloroquine  200 mg Oral Daily   insulin aspart  0-15 Units Subcutaneous Q4H   mouth rinse  15 mL Mouth Rinse q12n4p   methadone  10 mg Per Tube Q6H   multivitamin with minerals  1 tablet Oral Daily   sodium chloride flush  10-40 mL Intracatheter Q12H   sodium chloride flush  10-40 mL Intracatheter Q12H   sodium chloride HYPERTONIC  4 mL Nebulization BID   vancomycin variable dose per unstable renal function (pharmacist dosing)   Does not apply See admin instructions   Continuous Infusions:  sodium chloride     ceFEPime (MAXIPIME) IV     heparin     lactated ringers 10 mL/hr at 12/10/19 0656   PRN Meds: sodium chloride, acetaminophen **OR** acetaminophen, Gerhardt's butt cream, heparin, methadone, naLOXone (NARCAN)  injection, polyethylene glycol, sodium chloride flush, sodium chloride flush   Vital Signs    Vitals:   12/10/19 0715 12/10/19 0800 12/10/19 0805 12/10/19 0830  BP:  (!) 96/50  100/60  Pulse: 83 84  84  Resp: 18 12  11   Temp:   98.6 F (37 C)   TempSrc:   Oral   SpO2: 100% 100%  100%  Weight:      Height:        Intake/Output Summary (Last 24 hours) at 12/10/2019 0857 Last data filed at 12/10/2019 1694 Gross per 24 hour  Intake 788.53 ml  Output 110 ml  Net 678.53 ml   Last 3 Weights 12/10/2019 12/09/2019 12/08/2019    Weight (lbs) 175 lb 14.8 oz 174 lb 6.1 oz 177 lb 7.5 oz  Weight (kg) 79.8 kg 79.1 kg 80.5 kg      Telemetry    Paced rhythm- Personally Reviewed  ECG    A new tracing is not been performed since 12/06/2019- Personally Reviewed  Physical Exam  Appears older than stated age, somewhat crumpled in bed deformed due to rheumatoid arthritis. GEN: No acute distress.   Neck: No JVD Cardiac:  Harsh 3/6 right upper sternal border and left mid sternal border systolic murmur compatible with aortic stenosis. Respiratory: Clear to auscultation bilaterally. GI: Soft, nontender, non-distended  MS:  Ulnar deformity related to rheumatoid arthritis with deformed fixed phalangeal joints. Neuro:  Nonfocal  Psych: Normal affect   Labs    High Sensitivity Troponin:   Recent Labs  Lab 12/07/19 0811 12/07/19 1003  TROPONINIHS 156* 163*      Chemistry Recent Labs  Lab 12/08/19 0431 12/08/19 1530 12/09/19 0451 12/10/19 0318  NA 133* 135 136 133*  K 4.4 4.1 4.4 4.9  CL 98 101 102 99  CO2 24 26 25 26   GLUCOSE 217* 181* 118* 153*  BUN 61* 46* 30* 43*  CREATININE 2.46* 1.58* 1.16 2.08*  CALCIUM 7.5* 7.6* 7.8* 8.3*  PROT 5.5*  --  5.4* 5.6*  ALBUMIN 2.5* 2.3* 2.4* 2.5*  AST 159*  --  103* 78*  ALT 258*  --  208* 172*  ALKPHOS 134*  --  121 121  BILITOT 2.8*  --  2.7* 2.3*  GFRNONAA 26* 44* >60 31*  GFRAA 30* 51* >60 36*  ANIONGAP 11 8 9 8      Hematology Recent Labs  Lab 12/07/19 0451 12/08/19 0431 12/08/19 0521 12/09/19 0451 12/09/19 1027  WBC 10.6* 9.7  --  10.1  --   RBC 3.15* 3.14*  --  3.11*  --   HGB 9.5* 9.6* 10.2* 9.5*  --   HCT 30.1* 30.0* 30.0* 30.1*  --   MCV 95.6 95.5  --  96.8  --   MCH 30.2 30.6  --  30.5  --   MCHC 31.6 32.0  --  31.6  --   RDW 18.9* 19.8*  --  20.1*  --   PLT 122* 95*  --  69* 73*    BNP Recent Labs  Lab 12/07/19 0451  BNP >4,500.0*     DDimer  Recent Labs  Lab 12/09/19 1027  DDIMER 12.47*     Radiology    DG Chest Port 1  View  Result Date: 12/09/2019 CLINICAL DATA:  68 year old male with weakness and respiratory failure negative for COVID-19. EXAM: PORTABLE CHEST 1 VIEW COMPARISON:  12/08/2019 and earlier. FINDINGS: Portable AP semi upright view at 0543 hours. Extubated. Enteric tube removed. Stable right IJ central line. Stable left chest cardiac pacemaker. Stable lung volumes and mediastinal contours. Mildly improved bibasilar ventilation with residual patchy opacity. No pneumothorax, pulmonary edema or pleural effusion. Negative visible bowel gas pattern. IMPRESSION: 1. Extubated and enteric tube removed. 2. Stable lung volumes with improved ventilation, decreased atelectasis. Electronically Signed   By: Genevie Ann M.D.   On: 12/09/2019 08:11   ECHOCARDIOGRAM COMPLETE  Result Date: 12/09/2019   ECHOCARDIOGRAM REPORT   Patient Name:   [name] Date of Exam: 12/09/2019 Medical Rec #:  [id number]    Height:       68.0 in Accession #:    [id number]   Weight:       174.4 lb Date of Birth:  03/03/1951    BSA:          1.93 m Patient Age:    44 years     BP:           83/56 mmHg Patient Gender: M            HR:           86 bpm. Exam Location:  Inpatient Procedure: 2D Echo Indications:    Aortic Stenosis  History:        Patient has prior history of Echocardiogram examinations, most                 recent 11/08/2019. CAD; Risk Factors:Hypertension and Diabetes.  Sonographer:    Mikki Santee RDCS (AE) Referring Phys: [id number] [place]  1. Left ventricular ejection fraction, by visual estimation, is 25 to 30%. The left ventricle has severely decreased function. There is no left ventricular hypertrophy.  2. Left ventricular diastolic parameters are consistent with Grade II diastolic dysfunction (pseudonormalization).  3. Moderately dilated left ventricular internal cavity size.  4. The left ventricle demonstrates global hypokinesis.  5. Compared to echo from 11/08/19 LVEF is worse.  6. Global right ventricle  has severely reduced systolic function.The right ventricular size is severely enlarged. No increase in  right ventricular wall thickness.  7. Left atrial size was mildly dilated.  8. Right atrial size was normal.  9. The mitral valve is normal in structure. Moderate mitral valve regurgitation. 10. The tricuspid valve is normal in structure. Tricuspid valve regurgitation is mild. 11. The aortic valve is abnormal. Aortic valve regurgitation is not visualized. Severe aortic valve stenosis. 12. Poor acoustic windows limit study AV is calcified with restricted motion that appears to be severe. 13. The pulmonic valve was not well visualized. Pulmonic valve regurgitation is not visualized. FINDINGS  Left Ventricle: Left ventricular ejection fraction, by visual estimation, is 25 to 30%. The left ventricle has severely decreased function. The left ventricle demonstrates global hypokinesis. The left ventricular internal cavity size was moderately dilated left ventricle. There is no left ventricular hypertrophy. Left ventricular diastolic parameters are consistent with Grade II diastolic dysfunction (pseudonormalization). Compared to echo from 11/08/19 LVEF is worse. Right Ventricle: The right ventricular size is severely enlarged. No increase in right ventricular wall thickness. Global RV systolic function is has severely reduced systolic function. Left Atrium: Left atrial size was mildly dilated. Right Atrium: Right atrial size was normal in size Pericardium: There is no evidence of pericardial effusion. Mitral Valve: The mitral valve is normal in structure. Moderate mitral valve regurgitation. Tricuspid Valve: The tricuspid valve is normal in structure. Tricuspid valve regurgitation is mild. Aortic Valve: The aortic valve is abnormal. Aortic valve regurgitation is not visualized. Aortic regurgitation PHT measures 231 msec. Severe aortic stenosis is present. Aortic valve mean gradient measures 34.0 mmHg. Aortic valve peak  gradient measures 57.8 mmHg. Aortic valve area, by VTI measures 0.74 cm. Poor acoustic windows limit study AV is calcified with restricted motion that appears to be severe. Pulmonic Valve: The pulmonic valve was not well visualized. Pulmonic valve regurgitation is not visualized. Pulmonic regurgitation is not visualized. Aorta: The aortic root is normal in size and structure. IAS/Shunts: No atrial level shunt detected by color flow Doppler.  LEFT VENTRICLE PLAX 2D LVIDd:         5.70 cm  Diastology LVIDs:         4.90 cm  LV e' lateral:   5.76 cm/s LV PW:         0.90 cm  LV E/e' lateral: 21.0 LV IVS:        0.70 cm  LV e' medial:    3.89 cm/s LVOT diam:     2.00 cm  LV E/e' medial:  31.1 LV SV:         47 ml LV SV Index:   24.07 LVOT Area:     3.14 cm  RIGHT VENTRICLE RV S prime:     5.27 cm/s TAPSE (M-mode): 0.6 cm LEFT ATRIUM              Index       RIGHT ATRIUM           Index LA diam:        4.30 cm  2.23 cm/m  RA Area:     19.30 cm LA Vol (A2C):   121.0 ml 62.76 ml/m RA Volume:   52.90 ml  27.44 ml/m LA Vol (A4C):   73.0 ml  37.86 ml/m LA Biplane Vol: 94.2 ml  48.86 ml/m  AORTIC VALVE AV Area (Vmax):    0.73 cm AV Area (Vmean):   0.75 cm AV Area (VTI):     0.74 cm AV Vmax:  380.00 cm/s AV Vmean:          276.500 cm/s AV VTI:            0.800 m AV Peak Grad:      57.8 mmHg AV Mean Grad:      34.0 mmHg LVOT Vmax:         88.40 cm/s LVOT Vmean:        65.700 cm/s LVOT VTI:          0.189 m LVOT/AV VTI ratio: 0.24 AI PHT:            231 msec  AORTA Ao Root diam: 3.00 cm MITRAL VALVE                         TRICUSPID VALVE MV Area (PHT): 5.13 cm              TR Peak grad:   30.7 mmHg MV PHT:        42.92 msec            TR Vmax:        299.00 cm/s MV Decel Time: 148 msec MR Peak grad: 114.9 mmHg             SHUNTS MR Mean grad: 69.0 mmHg              Systemic VTI:  0.19 m MR Vmax:      536.00 cm/s            Systemic Diam: 2.00 cm MR Vmean:     385.0 cm/s MV E velocity: 121.00 cm/s 103 cm/s MV  A velocity: 79.30 cm/s  70.3 cm/s MV E/A ratio:  1.53        1.5  Dorris Carnes MD Electronically signed by Dorris Carnes MD Signature Date/Time: 12/09/2019/3:49:31 PM    Final     Cardiac Studies   Echo performed this admission as noted above.  Patient Profile     68 y.o. 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 Vaughan Browner.  Assessment & Plan    1. Shock: With multifactorial etiology on presentation, now resolved after antibiotics, CRRT, and supportive therapy. 2. Severe calcific aortic stenosis: He had begun TAVR work-up but has had multiple hospitalizations.  Final opinion concerning therapy was never rendered.  Will be seen by the TAVR team today with further recommendations to follow.  With his plethora of other multiple medical problems it is uncertain if TAVR will be helpful.  Await opinion. 3. Chronic kidney disease, IV-V, CRRT currently on hold to see how the patient does on his own. 4. Debilitating rheumatoid arthritis prevents independent mobility. 5. Left BKA: Prevents independent mobility.  All activity is via a motorized wheelchair. 6. Acute on chronic combined systolic and diastolic heart failure: Improved with CR RT.  We will see how he does over the next day or so.  Has received IV Lasix this morning via nephrology. 7. Permanent pacemaker for high-grade AV block, functioning normally        For questions or updates, please contact [place] Please consult [email or web address] for contact info under        Signed, Sinclair Grooms, MD  12/10/2019, 8:57 AM

## 2019-12-10 NOTE — Progress Notes (Signed)
Pharmacy Antibiotic Note  [name] is a 68 y.o. male admitted on 12/03/2019 with sepsis. Pt started on meropenem and vancomycin which was de-escalated to cefepime and vancomycin 12/14. Pt previously on CRRT for acute on chronic renal failure which was stopped 12/14.  Minimal UOP overnight, lasix trial to begin today but pt will likely require further RRT. Tracheal aspirate is growing MRSA and Pseudomonas so current antimicrobial regimen will continue x10 total days per CCM. Vancomycin random level is therapeutic at 18 mcg/ml.  Plan: -Continue cefepime 1g IV q24h for now -Vancomycin 750mg  IV x1 tonight then follow RRT plans and UOP for further dosing -Follow nephrology plans, cultures  Height: 5\' 8"  (172.7 cm) Weight: 175 lb 14.8 oz (79.8 kg) IBW/kg (Calculated) : 68.4  Temp (24hrs), Avg:97.2 F (36.2 C), Min:95.6 F (35.3 C), Max:99.1 F (37.3 C)  Recent Labs  Lab 11/26/2019 2018 12/06/19 0127 12/06/19 0449 12/06/19 1336 12/06/19 1644 12/07/19 0451 12/08/19 0431 12/08/19 1530 12/09/19 0451 12/10/19 0318 12/10/19 0950  WBC  --   --  11.2*  --   --  10.6* 9.7  --  10.1  --  11.6*  CREATININE  --   --  4.10*  --   --  4.10* 2.46* 1.58* 1.16 2.08*  --   LATICACIDVEN 2.3* 2.8* 3.0* 4.5* 3.8*  --   --   --   --   --   --     Estimated Creatinine Clearance: 32 mL/min (A) (by C-G formula based on SCr of 2.08 mg/dL (H)).    Allergies  Allergen Reactions  . Sulfa Antibiotics Rash   Microbiology: 12/10 COVID - neg 12/10 BCx - ngtd 12/10 UCx - ngF 12/11 MRSA - neg 12/11 resp PCR - neg 12/11 TA: Pseudomonas (panS) + MRSA  Antimicrobials: CTX 12/10 >> 12/11 Doxy 12/10 >> 12/11 Vancomycin 12/12 >> Meropenem 12/12 >>12/14 Eraxis 12/12 x1 Cefepime 12/15 >>   Arrie Senate, PharmD, BCPS Clinical Pharmacist [phone] Please check AMION for all [place] numbers 12/10/2019

## 2019-12-10 NOTE — Consult Note (Addendum)
[place] VALVE TEAM  Inpatient TAVR Consultation:   Patient ID: [name]; [id number]; 1951-11-20   Admit date: 12/15/2019 Date of Consult: 12/10/2019  Primary Care Provider: [name], MD Primary Cardiologist: Dr. Harl [name] Primary Electrophysiologist: Dr. Lovena [name]    Patient Profile:   [name] is a 68 y.o. male with a hx of chronic combined S/D CHF, ischemic cardiomyopathy, CAD s/p 3V CABG in 2016 ([place]), post-operative atrial fibrillation, CHB s/p PPM (9/4), DM with peripheral neuropathy, HTN, CKD stage III with recurrent AKI, HLD, PAD s/p L BKA and essentially wheelchair-bound, protein calorie malnutrition, rheumatoid arthritis on chronic steroids, Candida tropicalis infection, bronchiectasis, and severe aortic stenosis who is being seen today for the evaluation of TAVR canidacy at the request of [name].   History of Present Illness:   Mr. Macke has an extremely complicated past medical history as outlined above.  He is well-known to the multidisciplinary valve team as he has been in the work-up for transcatheter aortic valve replacement.  He was admitted in July 2020 with AMS and chest pain and found to be hypotensive and bradycardic. He had acute on chronic CHF with mildly elevated troponin. Echo showed EF 60 to 65% with severe AS (mean gradient 16.8 mmHg, AVA 0.79 cm, DVI 0.29 ).  Nuclear stress test showed an inferior wall perfusion defect not felt to be due to ischemia.    He was seen by Dr. Angelena [name] in September for initial structural consultation for consideration of TAVR for severe AS.  During that visit he was noted to have chest pain concerning for unstable angina.  He was admitted for heart catheterization.  However, the patient was found to be in complete heart block and ended up undergoing dual-chamber pacemaker placement by Dr. Lovena [name] on 08/30/19. L/RHC on 09/03/2019 showed severe native CAD with continued  patency of the LIMA to LAD, SVG to RI with moderate ostial and proximal disease in the SVG to RPDA (no good PCI target), and SVG to posterior descending. Peak to peak and mean gradient across the aortic valve were 38 and 31 mmHg, respectively.  Right heart pressures were normal.  Patient developed acute kidney injury which trended towards baseline by discharge.  He was discharged from hospital referred to pulmonary for evaluation of chronic cough.  However, the patient was readmitted to the hospital 1 week later with acute kidney injury felt to be secondary to contrast-induced nephropathy.  His renal function again recovered to baseline.  The patient was evaluated by Dr. Carlis [name] in pulmonary medicine clinic and diagnosed with severe bilateral lower lobe bronchiectasis.  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  Starting of his antifungal was delayed due to issues with insurance coverage.   Cardiac CT and CT angiography for pre-TAVR work-up was completed on 10/28/2019.  This showed anatomy suitable for a 26 mm Edwards SAPIEN valve via the TF approach.  The patient was called multiple times to arrange a follow-up BMP; however, this was never completed.   He was seen in consultation by Dr. Roxy [name] on 11/04/2019.  He recommended repeating an echo given poor acoustic windows on original echo and aortic gradients only in the moderate range. He also wanted to get clearance from both pulmonary and infectious disease before proceeding with TAVR. Repeat echo was completed on 11/08/2019 which showed worsening of ejection fraction to 40 to 45% and severe aortic stenosis with a mean gradient of  44.3 mmHg and moderate AI.  He was subsequently referred to the infectious disease clinic which was delayed due to several missed appointments.  However, he was finally seen by Dr. Megan [name] on 11/24 who agreed with trial of antifungal with close follow-up.    He then presented to  [name], ED on 1211/20 with progressive confusion.  He was found to be septic with acute kidney injury with creatinine up to 4.1 and ultimately intubated and placed on pressors.  [facility] was [name] for further management under the care of pulmonary critical care.  Nephrology was consulted given worsening renal failure and he was placed on CRRT.  He was eventually able to be weaned from pressors and extubated on 12/08/2019. CRRT stopped 12/14.  Kidney function improved to 1.16, but increased to 2.08 today.  He was also noted to have acute liver failure with elevated LFTs and worsening coagulopathy.  There was some concern for DIC as he had notable bleeding at central line sites. Covd 19 negative, urine culture negative, BCx NGTD, 12/11 trach asp showed MRSA and pseudomonas. He has been treated with ceftriaxone, doxycycline, vancomycin, meropenem, and andulafungin.  Dr. Tamala [name] with pulmonary critical care felt his presentation is most likely multiorgan failure secondary to Pseudomonas and MRSA tracheobronchitis versus pneumonia.  He stopped his voriconazole and plans to discuss this with Dr. Carlis [name]. Per Dr. Carolin [name] with nephrology he will probably need intermittent dialysis.  However, it is unknown if he can tolerate intermittent hemodialysis given severe AS.  He recommended a goals of care discussion. For now they are doing a lasix trial with IV lasix.  Repeat echo 12/09/2019 showed worsening LV function with EF 25 to 30% and severe aortic stenosis with a mean gradient of 34 mmHg and AVA of 0.73 cm.  The structural heart team is consulted for consideration of TAVR versus palliative care. He is currently laying in bed in the ICU. He denies orthopnea, PND, chest pain, dizziness or syncope. Wife is at bedside. They have not spoken to palliative care yet but would need to have the discussion with his children as well.   Past Medical History:  Diagnosis Date  . Aortic stenosis   . ARF (acute renal  failure) ([place]) 09/11/2019  . Chronic back pain   . Chronic combined systolic (congestive) and diastolic (congestive) heart failure (HCC)    a. EF 30-35% in 2016 b. at 30-35% by echo in 08/2016 c. 06/2019: echo showing EF of 60-65% but found to have severe AS.   [name] Chronic neck pain   . CKD (chronic kidney disease), stage III   . Complete heart block ([place]) 08/29/2019   a. s/p Medtronic dual chamber pacemeker implantation on 08/30/2019.  [name] Coronary artery disease    a. s/p CABG in 06/2015 with LIMA-LAD, SVG-PDA, SVG-Intermediate  . Diabetes mellitus   . Diabetic neuropathy ([place])   . Hyperkalemia   . Hypertension   . Loculated pleural effusion 09/09/2016  . Pancytopenia ([place]) 08/13/2014  . 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  . Rheumatoid arthritis(714.0)   . Seizure ([place]) 08/13/2014   Pt denies    Past Surgical History:  Procedure Laterality Date  . 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 CARDIAC SURGERY    . FRACTURE SURGERY    . MUSCLE BIOPSY  06/2016  . PACEMAKER IMPLANT N/A 08/30/2019   Procedure: PACEMAKER IMPLANT;  Surgeon: Evans Lance, MD;  Location: [facility]  INVASIVE CV LAB;  Service: Cardiovascular;  Laterality: N/A;  . right foot surgery-Toe amputations    . RIGHT/LEFT HEART CATH AND CORONARY/GRAFT ANGIOGRAPHY N/A 09/03/2019   Procedure: RIGHT/LEFT HEART CATH AND CORONARY/GRAFT ANGIOGRAPHY;  Surgeon: Leonie Man, MD;  Location: [facility] CV LAB;  Service: Cardiovascular;  Laterality: N/A;  . TEMPORARY PACEMAKER N/A 08/29/2019   Procedure: TEMPORARY PACEMAKER;  Surgeon: Sherren Mocha, MD;  Location: [facility] CV LAB;  Service: Cardiovascular;  Laterality: N/A;  . TOTAL HIP ARTHROPLASTY Left      Inpatient Medications: Scheduled Meds: . chlorhexidine  15 mL Mouth Rinse BID  . Chlorhexidine Gluconate Cloth  6 each Topical Q2200  . feeding supplement (ENSURE ENLIVE)  237 mL Oral BID BM  . heparin injection  (subcutaneous)  5,000 Units Subcutaneous Q8H  . hydrocortisone sod succinate (SOLU-CORTEF) inj  50 mg Intravenous Daily  . hydroxychloroquine  200 mg Oral Daily  . insulin aspart  0-9 Units Subcutaneous TID WC  . mouth rinse  15 mL Mouth Rinse q12n4p  . methadone  10 mg Per Tube Q6H  . multivitamin with minerals  1 tablet Oral Daily  . sodium chloride flush  10-40 mL Intracatheter Q12H  . sodium chloride flush  10-40 mL Intracatheter Q12H  . sodium chloride HYPERTONIC  4 mL Nebulization BID  . vancomycin variable dose per unstable renal function (pharmacist dosing)   Does not apply See admin instructions   Continuous Infusions: . sodium chloride    . ceFEPime (MAXIPIME) IV    . heparin    . lactated ringers 10 mL/hr at 12/10/19 0656   PRN Meds: sodium chloride, acetaminophen **OR** acetaminophen, Gerhardt's butt cream, heparin, methadone, naLOXone (NARCAN)  injection, polyethylene glycol, sodium chloride flush, sodium chloride flush  Allergies:    Allergies  Allergen Reactions  . Sulfa Antibiotics Rash    Social History:   Social History   Socioeconomic History  . Marital status: Married    Spouse name: Not on file  . Number of children: 2  . Years of education: Not on file  . Highest education level: Not on file  Occupational History  . Occupation: Retired Architect  Tobacco Use  . Smoking status: Never Smoker  . Smokeless tobacco: Never Used  Substance and Sexual Activity  . Alcohol use: No  . Drug use: No  . Sexual activity: Yes  Other Topics Concern  . Not on file  Social History Narrative  . Not on file   Social Determinants of Health   Financial Resource Strain:   . Difficulty of Paying Living Expenses: Not on file  Food Insecurity:   . Worried About Charity fundraiser in the Last Year: Not on file  . Ran Out of Food in the Last Year: Not on file  Transportation Needs:   . Lack of Transportation (Medical): Not on file  . Lack of Transportation  (Non-Medical): Not on file  Physical Activity:   . Days of Exercise per Week: Not on file  . Minutes of Exercise per Session: Not on file  Stress:   . Feeling of Stress : Not on file  Social Connections:   . Frequency of Communication with Friends and Family: Not on file  . Frequency of Social Gatherings with Friends and Family: Not on file  . Attends Religious Services: Not on file  . Active Member of Clubs or Organizations: Not on file  . Attends Archivist Meetings: Not on file  . Marital Status:  Not on file  Intimate Partner Violence:   . Fear of Current or Ex-Partner: Not on file  . Emotionally Abused: Not on file  . Physically Abused: Not on file  . Sexually Abused: Not on file    Family History:   The patient's family history includes Alzheimer's disease in his father; COPD in his brother; Diabetes in his father; Head & neck cancer in his nephew; Heart attack in his brother; Hypertension in his mother; Stroke in his mother.  ROS:  Please see the history of present illness.  ROS  The patient developed acute kidney injury all other ROS reviewed and negative.     Physical Exam/Data:   Vitals:   12/10/19 0805 12/10/19 0830 12/10/19 0936 12/10/19 1000  BP:  100/60  (!) 103/53  Pulse:  84  86  Resp:  11  14  Temp: 98.6 F (37 C)     TempSrc: Oral     SpO2:  100% 100% 100%  Weight:      Height:        Intake/Output Summary (Last 24 hours) at 12/10/2019 1028 Last data filed at 12/10/2019 0656 Gross per 24 hour  Intake 623.93 ml  Output 36 ml  Net 587.93 ml   Filed Weights   12/08/19 2000 12/09/19 0600 12/10/19 0500  Weight: 80.5 kg 79.1 kg 79.8 kg   Body mass index is 26.75 kg/m.  General:  Chronically ill appearing, frail, malnourished, contracted HEENT: normal Lymph: no adenopathy Neck: no JVD Endocrine:  No thryomegaly Vascular: No carotid bruits; FA pulses 2+ bilaterally without bruits  Cardiac:  normal S1, S2; RRR;  Harsh SEM heart best at  RUSB Lungs:  Diminished with crackes Abd: soft, nontender, no hepatomegaly  Ext:  S/p L BKA, anasarca, contracted upper extremity Musculoskeletal:  No deformities, BUE and BLE strength normal and equal Skin: warm and dry  Neuro:  CNs 2-12 intact, no focal abnormalities noted Psych:  Normal affect   EKG:  The EKG was personally reviewed and demonstrates: AV paced Telemetry:  Telemetry was personally reviewed and demonstrates:  Paced  Relevant CV Studies: Echo 12/09/19 IMPRESSIONS  1. Left ventricular ejection fraction, by visual estimation, is 25 to 30%. The left ventricle has severely decreased function. There is no left ventricular hypertrophy.  2. Left ventricular diastolic parameters are consistent with Grade II diastolic dysfunction (pseudonormalization).  3. Moderately dilated left ventricular internal cavity size.  4. The left ventricle demonstrates global hypokinesis.  5. Compared to echo from 11/08/19 LVEF is worse.  6. Global right ventricle has severely reduced systolic function.The right ventricular size is severely enlarged. No increase in right ventricular wall thickness.  7. Left atrial size was mildly dilated.  8. Right atrial size was normal.  9. The mitral valve is normal in structure. Moderate mitral valve regurgitation. 10. The tricuspid valve is normal in structure. Tricuspid valve regurgitation is mild. 11. The aortic valve is abnormal. Aortic valve regurgitation is not visualized. Severe aortic valve stenosis. 12. Poor acoustic windows limit study AV is calcified with restricted motion that appears to be severe. 13. The pulmonic valve was not well visualized. Pulmonic valve regurgitation is not visualized.  Laboratory Data:  Chemistry Recent Labs  Lab 12/08/19 1530 12/09/19 0451 12/10/19 0318  [name] 135 136 133*  K 4.1 4.4 4.9  CL 101 102 99  CO2 '26 25 26  ' GLUCOSE 181* 118* 153*  BUN 46* 30* 43*  CREATININE 1.58* 1.16 2.08*  CALCIUM 7.6* 7.8* 8.3*  GFRNONAA 44* >60 31*  GFRAA 51* >60 36*  ANIONGAP '8 9 8    ' Recent Labs  Lab 12/08/19 0431 12/08/19 1530 12/09/19 0451 12/10/19 0318  PROT 5.5*  --  5.4* 5.6*  ALBUMIN 2.5* 2.3* 2.4* 2.5*  AST 159*  --  103* 78*  ALT 258*  --  208* 172*  ALKPHOS 134*  --  121 121  BILITOT 2.8*  --  2.7* 2.3*   Hematology Recent Labs  Lab 12/07/19 0451 12/08/19 0431 12/08/19 0521 12/09/19 0451 12/09/19 1027  WBC 10.6* 9.7  --  10.1  --   RBC 3.15* 3.14*  --  3.11*  --   HGB 9.5* 9.6* 10.2* 9.5*  --   HCT 30.1* 30.0* 30.0* 30.1*  --   MCV 95.6 95.5  --  96.8  --   MCH 30.2 30.6  --  30.5  --   MCHC 31.6 32.0  --  31.6  --   RDW 18.9* 19.8*  --  20.1*  --   PLT 122* 95*  --  69* 73*   Cardiac EnzymesNo results for input(s): TROPONINI in the last 168 hours. No results for input(s): TROPIPOC in the last 168 hours.  BNP Recent Labs  Lab 12/07/19 0451  BNP >4,500.0*    DDimer  Recent Labs  Lab 12/09/19 1027  DDIMER 12.47*    Radiology/Studies:  DG Chest Port 1 View  Result Date: 12/09/2019 CLINICAL DATA:  68 year old male with weakness and respiratory failure negative for COVID-19. EXAM: PORTABLE CHEST 1 VIEW COMPARISON:  12/08/2019 and earlier. FINDINGS: Portable AP semi upright view at 0543 hours. Extubated. Enteric tube removed. Stable right IJ central line. Stable left chest cardiac pacemaker. Stable lung volumes and mediastinal contours. Mildly improved bibasilar ventilation with residual patchy opacity. No pneumothorax, pulmonary edema or pleural effusion. Negative visible bowel gas pattern. IMPRESSION: 1. Extubated and enteric tube removed. 2. Stable lung volumes with improved ventilation, decreased atelectasis. Electronically Signed   By: Genevie Ann M.D.   On: 12/09/2019 08:11   DG CHEST PORT 1 VIEW  Result Date: 12/08/2019 CLINICAL DATA:  Follow-up for respiratory failure. EXAM: PORTABLE CHEST 1 VIEW COMPARISON:  12/07/2019 and older studies. FINDINGS: Right basilar  atelectasis.  Remainder of the lungs is clear. No mediastinal widening. No pneumothorax or convincing pleural effusion. Endotracheal tube, right internal jugular central venous line and nasal/orogastric tube as well as the left anterior chest wall pacemaker are stable. IMPRESSION: 1. No change from the previous day's study. 2. Persistent mild right lung base atelectasis. No new lung abnormalities. 3. Stable support apparatus. Electronically Signed   By: Lajean Manes M.D.   On: 12/08/2019 10:57   DG CHEST PORT 1 VIEW  Result Date: 12/07/2019 CLINICAL DATA:  Status post insertion of new catheter. EXAM: PORTABLE CHEST 1 VIEW COMPARISON:  December 06, 2019. FINDINGS: Stable cardiomegaly. Endotracheal and nasogastric tubes are unchanged in position. Interval placement of right internal jugular catheter with distal tip in expected position of SVC. Left-sided pacemaker is unchanged in position. No pneumothorax pleural effusion is noted. Mild right basilar atelectasis or infiltrate is noted. Bony thorax is unremarkable. IMPRESSION: Interval placement of right internal jugular catheter with distal tip in expected position of SVC. No pneumothorax is noted. Mild right basilar atelectasis or infiltrate is noted. Electronically Signed   By: Marijo Conception M.D.   On: 12/07/2019 14:03   DG Chest Port 1 View  Result Date: 12/06/2019 CLINICAL DATA:  Hypoxia. EXAM: PORTABLE CHEST  1 VIEW COMPARISON:  Chest x-ray and abdominal CT scan dated 12/23/2019 FINDINGS: There overall heart size and pulmonary vascularity are normal. The left lung is now clear. Persistent atelectasis at the right lung base. Increased density posterior medially at the right base is consistent with the infiltrate seen on the prior CT scan. Slight gaseous distention of the stomach, unchanged. No acute bone abnormality. Prominent arthritic changes at the shoulders, right greater than left. IMPRESSION: 1. Increased density posterior medially at the right  lung base consistent with the infiltrate seen on the prior CT scan. 2. Persistent atelectasis at the right lung base. 3. Clearing of the left lung. Electronically Signed   By: Lorriane Shire M.D.   On: 12/06/2019 13:26   DG Chest Port 1V same Day  Result Date: 12/06/2019 CLINICAL DATA:  Status post intubation. EXAM: PORTABLE CHEST 1 VIEW COMPARISON:  Same day. FINDINGS: Stable cardiomegaly. Endotracheal and nasogastric tubes appear to be in grossly good position. Left-sided pacemaker is unchanged. No pneumothorax pleural effusion is noted. Stable right basilar atelectasis or infiltrate is noted. Bony thorax is unremarkable. IMPRESSION: Stable right basilar atelectasis or infiltrate. Support apparatus in grossly good position. Electronically Signed   By: Marijo Conception M.D.   On: 12/06/2019 15:38   ECHOCARDIOGRAM COMPLETE  Result Date: 12/09/2019   ECHOCARDIOGRAM REPORT   Patient Name:   AVELARDO REESMAN Raver Date of Exam: 12/09/2019 Medical Rec #:  [id number]    Height:       68.0 in Accession #:    [id number]   Weight:       174.4 lb Date of Birth:  11/23/1951    BSA:          1.93 m Patient Age:    83 years     BP:           83/56 mmHg Patient Gender: M            HR:           86 bpm. Exam Location:  Inpatient Procedure: 2D Echo Indications:    Aortic Stenosis  History:        Patient has prior history of Echocardiogram examinations, most                 recent 11/08/2019. CAD; Risk Factors:Hypertension and Diabetes.  Sonographer:    Mikki Santee RDCS (AE) Referring Phys: [id number] [place]  1. Left ventricular ejection fraction, by visual estimation, is 25 to 30%. The left ventricle has severely decreased function. There is no left ventricular hypertrophy.  2. Left ventricular diastolic parameters are consistent with Grade II diastolic dysfunction (pseudonormalization).  3. Moderately dilated left ventricular internal cavity size.  4. The left ventricle demonstrates global hypokinesis.  5.  Compared to echo from 11/08/19 LVEF is worse.  6. Global right ventricle has severely reduced systolic function.The right ventricular size is severely enlarged. No increase in right ventricular wall thickness.  7. Left atrial size was mildly dilated.  8. Right atrial size was normal.  9. The mitral valve is normal in structure. Moderate mitral valve regurgitation. 10. The tricuspid valve is normal in structure. Tricuspid valve regurgitation is mild. 11. The aortic valve is abnormal. Aortic valve regurgitation is not visualized. Severe aortic valve stenosis. 12. Poor acoustic windows limit study AV is calcified with restricted motion that appears to be severe. 13. The pulmonic valve was not well visualized. Pulmonic valve regurgitation is not visualized. FINDINGS  Left Ventricle: Left ventricular ejection  fraction, by visual estimation, is 25 to 30%. The left ventricle has severely decreased function. The left ventricle demonstrates global hypokinesis. The left ventricular internal cavity size was moderately dilated left ventricle. There is no left ventricular hypertrophy. Left ventricular diastolic parameters are consistent with Grade II diastolic dysfunction (pseudonormalization). Compared to echo from 11/08/19 LVEF is worse. Right Ventricle: The right ventricular size is severely enlarged. No increase in right ventricular wall thickness. Global RV systolic function is has severely reduced systolic function. Left Atrium: Left atrial size was mildly dilated. Right Atrium: Right atrial size was normal in size Pericardium: There is no evidence of pericardial effusion. Mitral Valve: The mitral valve is normal in structure. Moderate mitral valve regurgitation. Tricuspid Valve: The tricuspid valve is normal in structure. Tricuspid valve regurgitation is mild. Aortic Valve: The aortic valve is abnormal. Aortic valve regurgitation is not visualized. Aortic regurgitation PHT measures 231 msec. Severe aortic stenosis is  present. Aortic valve mean gradient measures 34.0 mmHg. Aortic valve peak gradient measures 57.8 mmHg. Aortic valve area, by VTI measures 0.74 cm. Poor acoustic windows limit study AV is calcified with restricted motion that appears to be severe. Pulmonic Valve: The pulmonic valve was not well visualized. Pulmonic valve regurgitation is not visualized. Pulmonic regurgitation is not visualized. Aorta: The aortic root is normal in size and structure. IAS/Shunts: No atrial level shunt detected by color flow Doppler.  LEFT VENTRICLE PLAX 2D LVIDd:         5.70 cm  Diastology LVIDs:         4.90 cm  LV e' lateral:   5.76 cm/s LV PW:         0.90 cm  LV E/e' lateral: 21.0 LV IVS:        0.70 cm  LV e' medial:    3.89 cm/s LVOT diam:     2.00 cm  LV E/e' medial:  31.1 LV SV:         47 ml LV SV Index:   24.07 LVOT Area:     3.14 cm  RIGHT VENTRICLE RV S prime:     5.27 cm/s TAPSE (M-mode): 0.6 cm LEFT ATRIUM              Index       RIGHT ATRIUM           Index LA diam:        4.30 cm  2.23 cm/m  RA Area:     19.30 cm LA Vol (A2C):   121.0 ml 62.76 ml/m RA Volume:   52.90 ml  27.44 ml/m LA Vol (A4C):   73.0 ml  37.86 ml/m LA Biplane Vol: 94.2 ml  48.86 ml/m  AORTIC VALVE AV Area (Vmax):    0.73 cm AV Area (Vmean):   0.75 cm AV Area (VTI):     0.74 cm AV Vmax:           380.00 cm/s AV Vmean:          276.500 cm/s AV VTI:            0.800 m AV Peak Grad:      57.8 mmHg AV Mean Grad:      34.0 mmHg LVOT Vmax:         88.40 cm/s LVOT Vmean:        65.700 cm/s LVOT VTI:          0.189 m LVOT/AV VTI ratio: 0.24 AI PHT:  231 msec  AORTA Ao Root diam: 3.00 cm MITRAL VALVE                         TRICUSPID VALVE MV Area (PHT): 5.13 cm              TR Peak grad:   30.7 mmHg MV PHT:        42.92 msec            TR Vmax:        299.00 cm/s MV Decel Time: 148 msec MR Peak grad: 114.9 mmHg             SHUNTS MR Mean grad: 69.0 mmHg              Systemic VTI:  0.19 m MR Vmax:      536.00 cm/s            Systemic  Diam: 2.00 cm MR Vmean:     385.0 cm/s MV E velocity: 121.00 cm/s 103 cm/s MV A velocity: 79.30 cm/s  70.3 cm/s MV E/A ratio:  1.53        1.5  Dorris Carnes MD Electronically signed by Dorris Carnes MD Signature Date/Time: 12/09/2019/3:49:31 PM    Final     Assessment and Plan:   [name] is a 68 y.o. male with symptoms of severe, stage D1 aortic stenosis with NYHA Class IV symptoms. We have reviewed the patient's echocardiogram which shows severe calcific aortic stenosis.  Repeat echocardiogram this admission shows further deterioration of his ejection fraction down to 25 to 30%.   The patient has a complex past medical history with multiple comorbidities including chronic immunosuppressed state, CAD s/p 3VCAD, DM with peripheral neuropathy, HTN, CKD stage III with recurrent AKI, HLD, PAD s/p L BKA and essentially wheelchair-bound, protein calorie malnutrition, profound weakness, Candida tropicalis infection, and bronchiectasis.  He now presents multiorgan failure initially requiring intubation felt to be secondary to Pseudomonas and MRSA tracheobronchitis versus pneumonia. He has also had renal failure requiring CRRT. He has anasarca and is now on a Lasix trail but nephrology feels he will likely require intermittent hemodialysis. Creat starting to trend back up (2.08 today). However, nephrology is not confident he will be able to tolerate it with his severe AS. Palliative care has been consulted. Patient and family have not discussed goals of care yet.   We do not feel he would be a good candidate for TAVR due to his many comorbidities, wheelchair bound status, protein calorie malnutrition, profound chronic weakness and ongoing issues with fungal and bacterial infections. Also, he develops AKI every time he is given contrast and proceeding with TAVR would likely put him at risk for needed long term hemodialysis. We do not feel TAVR would change the overall trajectory of this gentleman. At this time we  would recommend proceeding with comfort care.   Dr. Angelena [name] to follow.   Signed, [name] Form, PA-C  12/10/2019 10:28 AM  I have personally seen and examined this patient with [name] Form, PA-C. I agree with the assessment and plan as outlined above. I know Mr. Shill well from a previous consultation and office follow up. I met him in September 2020 as a new TAVR consult. He is a 68 yo male with history of chronic combined systolic and diastolic CHF, ischemic cardiomyopathy, CAD s/p 3V CABG in 2016, DM, HTN, post-operative atrial fibrillation, stage 3 chronic kidney disease, hyperlipidemia, PAD s/p left below  knee amputation, rheumatoid arthritis, ongoing pulmonary infections and severe AS. He was admitted to [facility] July 01, 2019 with chest pain and found to be hypotensive and bradycardic. Echo 07/02/19 with LVEF=60-65%, severe AS. When I met him in the office he c/o chest pain and was admitted for complete heart block. A pacemaker was placed. He has since been found to have multiple pulmonary infections. He is now admitted with septic/cardiogenic shock. His systolic function is now worsened with LVEF=25-30%. He has had acute on chronic renal failure and has required CRRT. He has been on multiple antibiotics and anti-fungal agents.   He has no complaints today.   Labs reviewed.  Echo reviewed  My exam:  General: Frail elderly male.  HEENT: OP clear, mucus membranes moist  SKIN: warm, dry. No rashes. Neuro: No focal deficits  Musculoskeletal: LLE amputation Psychiatric: Mood and affect normal  Neck: No JVD Lungs: Diminished breath sounds bilaterally, basilar crackles.  Cardiovascular: Regular rate and rhythm. Systolic murmur Abdomen:Soft. Bowel sounds present. Non-tender.  Extremities: LLE amputation. 2+ edema both [name].   I had been hopeful when I met him in September 2020 that he would be a candidate for TAVR.  He has had decline in his functional status over the past 3 months with  ongoing infectious processes. He is wheelchair bound, has a LLE amputation with malnutrition and failure to thrive. He has had ongoing issues with renal failure. He is being treated actively for a pulmonary infectious process. His LV systolic function has declined in the setting of severe aortic stenosis.   We have reviewed his presentation at our structural heart meeting this am. Given his many co-morbidities, he is not felt to be a candidate for TAVR. I have discussed this with the patient and his wife at the bedside today. I would recommend a palliative care consult. He states that he does not wish to give up yet as he enjoys his kids and grandkids. I wish there were more we could offer him but I told him that TAVR is no longer an option. The structural heart team will sign off. Please call us with questions.   Lauree Chandler 12/10/2019 1:11 PM

## 2019-12-10 NOTE — Progress Notes (Signed)
  Speech Language Pathology Treatment: Dysphagia  Patient Details Name: [name] MRN: [id number] DOB: 1951/12/17 Today's Date: 12/10/2019 Time: 7846-9629 SLP Time Calculation (min) (ACUTE ONLY): 17 min  Assessment / Plan / Recommendation Clinical Impression  Pt seen for ongoing swallowing treatment.  Wife present for session.  Pt asleep on SLP arrival, but easily roused.  Pt's voice sounds stronger and less harsh today than during assessment on 12/14.  Pt was somewhat more confused today than yesterday and exhibited some difficulty with POs.  Pt masticated pill whole in puree rather than swallowing, pt exhibited forward tongue thrust with PO trials and had difficulty retrieving puree from spoon, and pt masticated straw rather than siphoning.  With verbal cuing, pt was able to reduce these behaviors.  RN notes that pt received methadone 1 hour previously (which he typically takes), and also that he is no longer on CRRT at this time, which may be contributing to change in mental status.  Pt tolerated thin liquid, puree, and soft solids with no clinical s/s of aspiration.  Top dentures are still fitting poorly and were noted to fall into mouth during trials.  SLP removed top dentures and pt was still able to successfully masticate soft solid trials.  Recommend keeping top denture plate out with PO intake today until pt is more alert and able to monitor denture plate, or until better seal is achieved.    Recommend continuing mechanical soft solids with thin liquid.    HPI HPI: CLIFFTON SPRADLEY is a 68 y.o. male with medical history significant for rheumatoid arthritis, hypertension, coronary artery disease, diabetes mellitus, diastolic and systolic CHF, severe aortic stenosis, CKD 3, left above-knee amputation.  Admitted from [facility] with septic shock, multiorgan failure.  Pt has baseline dysphonia.  Wife reports they have seen a throat doctor (presume ENT) who told them muscles for vocal cords are weak. Pt  has chronic cough at baseline as well per hx obtained by evaluating SLP at [facility].      SLP Plan  Continue with current plan of care       Recommendations  Diet recommendations: Dysphagia 3 (mechanical soft);Thin liquid Liquids provided via: Cup;Straw Medication Administration: Whole meds with liquid Supervision: Trained caregiver to feed patient Compensations: Minimize environmental distractions;Slow rate;Small sips/bites Postural Changes and/or Swallow Maneuvers: Seated upright 90 degrees                Oral Care Recommendations: Oral care BID Follow up Recommendations: (Continue ST at next level of care) SLP Visit Diagnosis: Dysphagia, unspecified (R13.10) Plan: Continue with current plan of care       [place], [place], [place] Office: [phone] 12/10/2019, 10:38 AM

## 2019-12-10 NOTE — Progress Notes (Signed)
OT Cancellation Note  Patient Details Name: [name] MRN: [id number] DOB: 05/26/1951   Cancelled Treatment:    Reason Eval/Treat Not Completed: Patient not medically ready. RN reports removing femoral line and pt on bedrest until 1440.  Will follow and see as able.   Jolaine Artist, OT Acute Rehabilitation Services Pager [phone] Office [phone]   [name] 12/10/2019, 12:59 PM

## 2019-12-10 NOTE — Progress Notes (Signed)
[facility] NEPHROLOGY PROGRESS NOTE  Assessment/ Plan: Pt is a 68 y.o. yo male with history of RA, bronchiectasis, severe AS admitted from [facility] with shock, multiorgan failure and AKI.  #AKI, anuric: Suspect ATN from sepsis concomitant with acute cardiorenal syndrome/severe AS.  Discontinue CRRT on 12/14.  Blood pressure still low.  Worsening BUN and creatinine level.  Respiratory status acceptable.  CVP elevated, order a dose of Lasix 80 mg IV today. He will probably need intermittent hemodialysis.  However it is unknown if he can tolerate IHD given severe AS.  Recommend to have goals of care discussion. Watch for renal recovery.  Avoid nephrotoxins.  #Acute shock likely cardiogenic and sepsis: Currently off pressors.  On antibiotics per primary team.  #Acute respiratory failure, history of bronchiectasis: Extubated.  #Severe AS: Follow-up cardiologist plan.  #Hyponatremia, hypervolemic: Sodium level improved.  #Anemia of critical illness: Monitor hemoglobin level.  Discussed with ICU nurse.  Subjective: Blood oozing from the catheter site overnight and received DDAVP.  Urine output only 35 cc recorded.  Patient denied chest pain, shortness of breath nausea or vomiting.  CVP around 20. Objective Vital signs in last 24 hours: Vitals:   12/10/19 0700 12/10/19 0715 12/10/19 0800 12/10/19 0805  BP: (!) 94/55  (!) 96/50   Pulse: 84 83 84   Resp: 13 18 12    Temp:    98.6 F (37 C)  TempSrc:    Oral  SpO2: 100% 100% 100%   Weight:      Height:       Weight change: -0.7 kg  Intake/Output Summary (Last 24 hours) at 12/10/2019 0836 Last data filed at 12/10/2019 0656 Gross per 24 hour  Intake 788.53 ml  Output 110 ml  Net 678.53 ml       Labs: Basic Metabolic Panel: Recent Labs  Lab 12/08/19 1530 12/09/19 0451 12/10/19 0318  NA 135 136 133*  K 4.1 4.4 4.9  CL 101 102 99  CO2 26 25 26   GLUCOSE 181* 118* 153*  BUN 46* 30* 43*  CREATININE 1.58* 1.16  2.08*  CALCIUM 7.6* 7.8* 8.3*  PHOS 2.8 2.4* 3.6   Liver Function Tests: Recent Labs  Lab 12/08/19 0431 12/08/19 1530 12/09/19 0451 12/10/19 0318  AST 159*  --  103* 78*  ALT 258*  --  208* 172*  ALKPHOS 134*  --  121 121  BILITOT 2.8*  --  2.7* 2.3*  PROT 5.5*  --  5.4* 5.6*  ALBUMIN 2.5* 2.3* 2.4* 2.5*   No results for input(s): LIPASE, AMYLASE in the last 168 hours. Recent Labs  Lab 12/03/2019 2018  AMMONIA 30   CBC: Recent Labs  Lab 12/13/2019 1922 12/06/19 0449 12/07/19 0451 12/08/19 0431 12/08/19 0521 12/09/19 0451 12/09/19 1027  WBC 7.9 11.2* 10.6* 9.7  --  10.1  --   NEUTROABS 7.1  --  9.9*  --   --   --   --   HGB 9.1* 9.2* 9.5* 9.6* 10.2* 9.5*  --   HCT 29.8* 30.2* 30.1* 30.0* 30.0* 30.1*  --   MCV 99.0 97.7 95.6 95.5  --  96.8  --   PLT 108* 121* 122* 95*  --  69* 73*   Cardiac Enzymes: No results for input(s): CKTOTAL, CKMB, CKMBINDEX, TROPONINI in the last 168 hours. CBG: Recent Labs  Lab 12/09/19 1518 12/09/19 1955 12/09/19 2310 12/10/19 0323 12/10/19 0807  GLUCAP 163* 154* 179* 138* 78    Iron Studies: No results for input(s):  IRON, TIBC, TRANSFERRIN, FERRITIN in the last 72 hours. Studies/Results: DG Chest Port 1 View  Result Date: 12/09/2019 CLINICAL DATA:  68 year old male with weakness and respiratory failure negative for COVID-19. EXAM: PORTABLE CHEST 1 VIEW COMPARISON:  12/08/2019 and earlier. FINDINGS: Portable AP semi upright view at 0543 hours. Extubated. Enteric tube removed. Stable right IJ central line. Stable left chest cardiac pacemaker. Stable lung volumes and mediastinal contours. Mildly improved bibasilar ventilation with residual patchy opacity. No pneumothorax, pulmonary edema or pleural effusion. Negative visible bowel gas pattern. IMPRESSION: 1. Extubated and enteric tube removed. 2. Stable lung volumes with improved ventilation, decreased atelectasis. Electronically Signed   By: Genevie Ann M.D.   On: 12/09/2019 08:11    ECHOCARDIOGRAM COMPLETE  Result Date: 12/09/2019   ECHOCARDIOGRAM REPORT   Patient Name:   [name] Pesta Date of Exam: 12/09/2019 Medical Rec #:  [id number]    Height:       68.0 in Accession #:    [id number]   Weight:       174.4 lb Date of Birth:  June 07, 1951    BSA:          1.93 m Patient Age:    68 years     BP:           83/56 mmHg Patient Gender: M            HR:           86 bpm. Exam Location:  Inpatient Procedure: 2D Echo Indications:    Aortic Stenosis  History:        Patient has prior history of Echocardiogram examinations, most                 recent 11/08/2019. CAD; Risk Factors:Hypertension and Diabetes.  Sonographer:    Mikki Santee RDCS (AE) Referring Phys: [id number] [place]  1. Left ventricular ejection fraction, by visual estimation, is 25 to 30%. The left ventricle has severely decreased function. There is no left ventricular hypertrophy.  2. Left ventricular diastolic parameters are consistent with Grade II diastolic dysfunction (pseudonormalization).  3. Moderately dilated left ventricular internal cavity size.  4. The left ventricle demonstrates global hypokinesis.  5. Compared to echo from 11/08/19 LVEF is worse.  6. Global right ventricle has severely reduced systolic function.The right ventricular size is severely enlarged. No increase in right ventricular wall thickness.  7. Left atrial size was mildly dilated.  8. Right atrial size was normal.  9. The mitral valve is normal in structure. Moderate mitral valve regurgitation. 10. The tricuspid valve is normal in structure. Tricuspid valve regurgitation is mild. 11. The aortic valve is abnormal. Aortic valve regurgitation is not visualized. Severe aortic valve stenosis. 12. Poor acoustic windows limit study AV is calcified with restricted motion that appears to be severe. 13. The pulmonic valve was not well visualized. Pulmonic valve regurgitation is not visualized. FINDINGS  Left Ventricle: Left ventricular  ejection fraction, by visual estimation, is 25 to 30%. The left ventricle has severely decreased function. The left ventricle demonstrates global hypokinesis. The left ventricular internal cavity size was moderately dilated left ventricle. There is no left ventricular hypertrophy. Left ventricular diastolic parameters are consistent with Grade II diastolic dysfunction (pseudonormalization). Compared to echo from 11/08/19 LVEF is worse. Right Ventricle: The right ventricular size is severely enlarged. No increase in right ventricular wall thickness. Global RV systolic function is has severely reduced systolic function. Left Atrium: Left atrial size was mildly dilated. Right Atrium:  Right atrial size was normal in size Pericardium: There is no evidence of pericardial effusion. Mitral Valve: The mitral valve is normal in structure. Moderate mitral valve regurgitation. Tricuspid Valve: The tricuspid valve is normal in structure. Tricuspid valve regurgitation is mild. Aortic Valve: The aortic valve is abnormal. Aortic valve regurgitation is not visualized. Aortic regurgitation PHT measures 231 msec. Severe aortic stenosis is present. Aortic valve mean gradient measures 34.0 mmHg. Aortic valve peak gradient measures 57.8 mmHg. Aortic valve area, by VTI measures 0.74 cm. Poor acoustic windows limit study AV is calcified with restricted motion that appears to be severe. Pulmonic Valve: The pulmonic valve was not well visualized. Pulmonic valve regurgitation is not visualized. Pulmonic regurgitation is not visualized. Aorta: The aortic root is normal in size and structure. IAS/Shunts: No atrial level shunt detected by color flow Doppler.  LEFT VENTRICLE PLAX 2D LVIDd:         5.70 cm  Diastology LVIDs:         4.90 cm  LV e' lateral:   5.76 cm/s LV PW:         0.90 cm  LV E/e' lateral: 21.0 LV IVS:        0.70 cm  LV e' medial:    3.89 cm/s LVOT diam:     2.00 cm  LV E/e' medial:  31.1 LV SV:         47 ml LV SV Index:    24.07 LVOT Area:     3.14 cm  RIGHT VENTRICLE RV S prime:     5.27 cm/s TAPSE (M-mode): 0.6 cm LEFT ATRIUM              Index       RIGHT ATRIUM           Index LA diam:        4.30 cm  2.23 cm/m  RA Area:     19.30 cm LA Vol (A2C):   121.0 ml 62.76 ml/m RA Volume:   52.90 ml  27.44 ml/m LA Vol (A4C):   73.0 ml  37.86 ml/m LA Biplane Vol: 94.2 ml  48.86 ml/m  AORTIC VALVE AV Area (Vmax):    0.73 cm AV Area (Vmean):   0.75 cm AV Area (VTI):     0.74 cm AV Vmax:           380.00 cm/s AV Vmean:          276.500 cm/s AV VTI:            0.800 m AV Peak Grad:      57.8 mmHg AV Mean Grad:      34.0 mmHg LVOT Vmax:         88.40 cm/s LVOT Vmean:        65.700 cm/s LVOT VTI:          0.189 m LVOT/AV VTI ratio: 0.24 AI PHT:            231 msec  AORTA Ao Root diam: 3.00 cm MITRAL VALVE                         TRICUSPID VALVE MV Area (PHT): 5.13 cm              TR Peak grad:   30.7 mmHg MV PHT:        42.92 msec            TR Vmax:  299.00 cm/s MV Decel Time: 148 msec MR Peak grad: 114.9 mmHg             SHUNTS MR Mean grad: 69.0 mmHg              Systemic VTI:  0.19 m MR Vmax:      536.00 cm/s            Systemic Diam: 2.00 cm MR Vmean:     385.0 cm/s MV E velocity: 121.00 cm/s 103 cm/s MV A velocity: 79.30 cm/s  70.3 cm/s MV E/A ratio:  1.53        1.5  Dorris Carnes MD Electronically signed by Dorris Carnes MD Signature Date/Time: 12/09/2019/3:49:31 PM    Final     Medications: Infusions: . sodium chloride    . ceFEPime (MAXIPIME) IV    . heparin    . lactated ringers 10 mL/hr at 12/10/19 0656    Scheduled Medications: . chlorhexidine  15 mL Mouth Rinse BID  . Chlorhexidine Gluconate Cloth  6 each Topical Q2200  . feeding supplement (ENSURE ENLIVE)  237 mL Oral BID BM  . furosemide  80 mg Intravenous Once  . hydrocortisone sod succinate (SOLU-CORTEF) inj  50 mg Intravenous Daily  . insulin aspart  0-15 Units Subcutaneous Q4H  . mouth rinse  15 mL Mouth Rinse q12n4p  . methadone  10 mg Per Tube  Q6H  . multivitamin with minerals  1 tablet Oral Daily  . sodium chloride flush  10-40 mL Intracatheter Q12H  . sodium chloride flush  10-40 mL Intracatheter Q12H  . sodium chloride HYPERTONIC  4 mL Nebulization BID  . vancomycin variable dose per unstable renal function (pharmacist dosing)   Does not apply See admin instructions    have reviewed scheduled and prn medications.  Physical Exam: General: Not in distress, lying in bed comfortable Heart:RRR, s1s2 nl Lungs: Bibasal coarse breath sound, no wheezing Abdomen:soft, Non-tender, non-distended Extremities: Trace LE edema Dialysis Access: Right IJ temporary catheter placed on 12/12.  Maxden Naji Prasad Ambers Iyengar 12/10/2019,8:36 AM  LOS: 4 days  Pager: [phone]

## 2019-12-11 DIAGNOSIS — I447 Left bundle-branch block, unspecified: Secondary | ICD-10-CM

## 2019-12-11 DIAGNOSIS — E785 Hyperlipidemia, unspecified: Secondary | ICD-10-CM

## 2019-12-11 DIAGNOSIS — Z89512 Acquired absence of left leg below knee: Secondary | ICD-10-CM

## 2019-12-11 LAB — CBC
HCT: 27.1 % — ABNORMAL LOW (ref 39.0–52.0)
Hemoglobin: 8.4 g/dL — ABNORMAL LOW (ref 13.0–17.0)
MCH: 30.8 pg (ref 26.0–34.0)
MCHC: 31 g/dL (ref 30.0–36.0)
MCV: 99.3 fL (ref 80.0–100.0)
Platelets: 65 10*3/uL — ABNORMAL LOW (ref 150–400)
RBC: 2.73 MIL/uL — ABNORMAL LOW (ref 4.22–5.81)
RDW: 20.1 % — ABNORMAL HIGH (ref 11.5–15.5)
WBC: 12.2 10*3/uL — ABNORMAL HIGH (ref 4.0–10.5)
nRBC: 0.3 % — ABNORMAL HIGH (ref 0.0–0.2)

## 2019-12-11 LAB — COMPREHENSIVE METABOLIC PANEL
ALT: 129 U/L — ABNORMAL HIGH (ref 0–44)
AST: 61 U/L — ABNORMAL HIGH (ref 15–41)
Albumin: 2.3 g/dL — ABNORMAL LOW (ref 3.5–5.0)
Alkaline Phosphatase: 116 U/L (ref 38–126)
Anion gap: 9 (ref 5–15)
BUN: 68 mg/dL — ABNORMAL HIGH (ref 8–23)
CO2: 24 mmol/L (ref 22–32)
Calcium: 8 mg/dL — ABNORMAL LOW (ref 8.9–10.3)
Chloride: 95 mmol/L — ABNORMAL LOW (ref 98–111)
Creatinine, Ser: 2.54 mg/dL — ABNORMAL HIGH (ref 0.61–1.24)
GFR calc Af Amer: 29 mL/min — ABNORMAL LOW (ref >60)
GFR calc non Af Amer: 25 mL/min — ABNORMAL LOW (ref >60)
Glucose, Bld: 287 mg/dL — ABNORMAL HIGH (ref 70–99)
Potassium: 5.5 mmol/L — ABNORMAL HIGH (ref 3.5–5.1)
Sodium: 128 mmol/L — ABNORMAL LOW (ref 135–145)
Total Bilirubin: 2.3 mg/dL — ABNORMAL HIGH (ref 0.3–1.2)
Total Protein: 5.2 g/dL — ABNORMAL LOW (ref 6.5–8.1)

## 2019-12-11 LAB — PHOSPHORUS: Phosphorus: 4.9 mg/dL — ABNORMAL HIGH (ref 2.5–4.6)

## 2019-12-11 LAB — GLUCOSE, CAPILLARY
Glucose-Capillary: 216 mg/dL — ABNORMAL HIGH (ref 70–99)
Glucose-Capillary: 227 mg/dL — ABNORMAL HIGH (ref 70–99)
Glucose-Capillary: 240 mg/dL — ABNORMAL HIGH (ref 70–99)
Glucose-Capillary: 251 mg/dL — ABNORMAL HIGH (ref 70–99)
Glucose-Capillary: 271 mg/dL — ABNORMAL HIGH (ref 70–99)

## 2019-12-11 LAB — MAGNESIUM: Magnesium: 3 mg/dL — ABNORMAL HIGH (ref 1.7–2.4)

## 2019-12-11 LAB — APTT: aPTT: 32 seconds (ref 24–36)

## 2019-12-11 MED ORDER — HEPARIN SODIUM (PORCINE) 5000 UNIT/ML IJ SOLN
5000.0000 [IU] | Freq: Three times a day (TID) | INTRAMUSCULAR | Status: DC
  Administered 2019-12-12 – 2019-12-15 (×8): 5000 [IU] via SUBCUTANEOUS
  Filled 2019-12-11 (×9): qty 1

## 2019-12-11 MED ORDER — MIDODRINE HCL 5 MG PO TABS
10.0000 mg | ORAL_TABLET | Freq: Three times a day (TID) | ORAL | Status: DC
  Administered 2019-12-11 – 2019-12-17 (×17): 10 mg via ORAL
  Filled 2019-12-11 (×17): qty 2

## 2019-12-11 MED ORDER — CHLORHEXIDINE GLUCONATE CLOTH 2 % EX PADS: 6.0000 | MEDICATED_PAD | Freq: Every day | CUTANEOUS | Status: DC

## 2019-12-11 NOTE — Evaluation (Addendum)
Occupational Therapy Evaluation Patient Details Name: [name] MRN: [id number] DOB: 1951/08/09 Today's Date: 12/11/2019    History of Present Illness 68 y.o. male with medical history significant for rheumatoid arthritis, hypertension, coronary artery disease, diabetes mellitus, diastolic and systolic CHF, severe aortic stenosis, CKD 3, left below-knee amputation. Pt presenting with increasing confusion, severe aortic stenosis, shock, CHF.   Clinical Impression   Patient admitted for above and limited by problem list below, including impaired balance, decreased activity tolerance, generalized weakness, impaired cognition.  Patient currently requires total assist +2 for bed mobility and lateral scoot transfers, max assist for grooming, total assist for UB ADLs and total assist +2 for LB ADLs.  He remains confused, oriented x 2 (not to situation, or time), follows 1 step commands inconsistently, poor attention, recall and problem solving.  Baseline L BKA and significant B hand arthritic deformities/contractures noted. Pt reports independent with ADLs/mobility using RW to wheelchair but poor historian and providing inconsistent PLOF throughout session.  Due to decreased functional use of BUEs, asked RN to get soft call bell. He will benefit from continued OT services while admitted and after dc at SNF level in order to optimize return to PLOF and decrease burden of care.     Follow Up Recommendations  SNF;Supervision/Assistance - 24 hour    Equipment Recommendations  Other (comment)(TBD at next venue of care)    Recommendations for Other Services       Precautions / Restrictions Precautions Precautions: Fall Restrictions Weight Bearing Restrictions: No Other Position/Activity Restrictions: L BKA      Mobility Bed Mobility Overal bed mobility: Needs Assistance Bed Mobility: Supine to Sit     Supine to sit: Total assist;+2 for physical assistance     General bed mobility  comments: for LE, trunk support, as well as scooting foward  Transfers Overall transfer level: Needs assistance Equipment used: None Transfers: Lateral/Scoot Transfers          Lateral/Scoot Transfers: Total assist;+2 physical assistance;From elevated surface      Balance Overall balance assessment: Needs assistance Sitting-balance support: Bilateral upper extremity supported;Feet unsupported Sitting balance-Leahy Scale: Poor Sitting balance - Comments: min-modA static sitting with cueing to utilize BUEs for support   Standing balance support: (deferred standing 2/2 imbalance at this time)                               ADL either performed or assessed with clinical judgement   ADL Overall ADL's : Needs assistance/impaired     Grooming: Maximal assistance;Oral care;Bed level Grooming Details (indicate cue type and reason): using suction oral care to clean mouth, requires hand over hand support throughout  Upper Body Bathing: Total assistance;Bed level   Lower Body Bathing: Total assistance;+2 for physical assistance;Bed level   Upper Body Dressing : Total assistance;Bed level   Lower Body Dressing: Total assistance;+2 for physical assistance;Bed level   Toilet Transfer: Total assistance Toilet Transfer Details (indicate cue type and reason): lateral scoot simulated to recliner          Functional mobility during ADLs: Total assistance;+2 for physical assistance General ADL Comments: pt limited by cognition, weakness, impaired balance     Vision   Additional Comments: continue assessment, cueing to maintain eyes open during session with poor carryover     Perception     Praxis      Pertinent Vitals/Pain Pain Assessment: No/denies pain     Hand Dominance Left  Extremity/Trunk Assessment Upper Extremity Assessment Upper Extremity Assessment: Generalized weakness;RUE deficits/detail;LUE deficits/detail RUE Deficits / Details: grossly 3-/5 MMT,  signficant arthritic hands with poor grasp/functional use  RUE Sensation: WNL RUE Coordination: decreased fine motor;decreased gross motor LUE Deficits / Details: grossly 3-/5 MMT, significant arthritic hands and poor grasp/functional use  LUE Sensation: WNL LUE Coordination: decreased fine motor;decreased gross motor   Lower Extremity Assessment Lower Extremity Assessment: Defer to PT evaluation RLE Deficits / Details: ~10 degree knee flexion contracture, grossly 3/5 strength RLE Sensation: decreased light touch;history of peripheral neuropathy LLE Deficits / Details: L BKA, 20 degree knee flexion contracture LLE Sensation: history of peripheral neuropathy   Cervical / Trunk Assessment Cervical / Trunk Assessment: Kyphotic   Communication Communication Communication: No difficulties   Cognition Arousal/Alertness: Awake/alert(although keeps eyes closed during majority of session) Behavior During Therapy: WFL for tasks assessed/performed Overall Cognitive Status: Impaired/Different from baseline Area of Impairment: Orientation;Attention;Memory;Following commands;Awareness;Problem solving                 Orientation Level: Disoriented to;Time;Situation Current Attention Level: Focused Memory: Decreased recall of precautions;Decreased short-term memory Following Commands: Follows one step commands with increased time;Follows one step commands inconsistently   Awareness: Intellectual Problem Solving: Slow processing;Decreased initiation;Difficulty sequencing;Requires verbal cues;Requires tactile cues General Comments: pt providing conflicting reports of history during session, poor attention and sustained task engagement    General Comments  VSS on RA     Exercises     Shoulder Instructions      Home Living Family/patient expects to be discharged to:: Private residence Living Arrangements: Spouse/significant other Available Help at Discharge: Family;Available 24  hours/day Type of Home: House Home Access: Ramped entrance     Home Layout: One level               Home Equipment: Wheelchair - power   Additional Comments: further bathroom setup and DME information needed      Prior Functioning/Environment Level of Independence: Needs assistance  Gait / Transfers Assistance Needed: Pt reports transferring from bed to chair with assistance of spouse, also reports walkign with use of prosthetic about a month ago. Past chart review in 2017 reports hoyer lift for transfers, may need to confirm functional status with family due to [place]. ADL's / Homemaking Assistance Needed: pt reports independent for basic ADLs, unclear accuracy of this due to signficant B hand arthritis             OT Problem List: Decreased strength;Decreased range of motion;Decreased activity tolerance;Impaired balance (sitting and/or standing);Decreased coordination;Decreased cognition;Decreased safety awareness;Decreased knowledge of use of DME or AE;Decreased knowledge of precautions;Cardiopulmonary status limiting activity;Obesity;Impaired UE functional use      OT Treatment/Interventions: Self-care/ADL training;DME and/or AE instruction;Therapeutic activities;Patient/family education;Balance training;Cognitive remediation/compensation;Therapeutic exercise    OT Goals(Current goals can be found in the care plan section) Acute Rehab OT Goals Patient Stated Goal: to get out of bed OT Goal Formulation: With patient Time For Goal Achievement: 12/25/19 Potential to Achieve Goals: Good  OT Frequency: Min 2X/week   Barriers to D/C:            Co-evaluation PT/OT/SLP Co-Evaluation/Treatment: Yes Reason for Co-Treatment: For patient/therapist safety;Necessary to address cognition/behavior during functional activity;To address functional/ADL transfers PT goals addressed during session: Mobility/safety with mobility;Balance;Strengthening/ROM OT goals addressed during  session: ADL's and self-care      AM-PAC OT "6 Clicks" Daily Activity     Outcome Measure Help from another person eating meals?: Total Help from another person taking  care of personal grooming?: A Lot Help from another person toileting, which includes using toliet, bedpan, or urinal?: Total Help from another person bathing (including washing, rinsing, drying)?: Total Help from another person to put on and taking off regular upper body clothing?: Total Help from another person to put on and taking off regular lower body clothing?: Total 6 Click Score: 7   End of Session Nurse Communication: Mobility status;Need for lift equipment;Precautions  Activity Tolerance: Patient limited by lethargy Patient left: in chair;with call bell/phone within reach;with chair alarm set  OT Visit Diagnosis: Other abnormalities of gait and mobility (R26.89);Muscle weakness (generalized) (M62.81);Other symptoms and signs involving cognitive function                Time: 9242-6834 OT Time Calculation (min): 32 min Charges:  OT General Charges $OT Visit: 1 Visit OT Evaluation $OT Eval Moderate Complexity: 1 Mod  Jolaine Artist, OT Acute Rehabilitation Services Pager [phone] Office [phone]    [name] 12/11/2019, 1:29 PM

## 2019-12-11 NOTE — Progress Notes (Signed)
   Agree with structural heart team relative to management of aortic valve disease.  Urine output is poor and may be heading towards dialysis.  No specific cardiac recommendations at this time.  We will follow and help as needed.  No specific further recommendations at this time.

## 2019-12-11 NOTE — Progress Notes (Signed)
[facility] NEPHROLOGY PROGRESS NOTE  Assessment/ Plan: Pt is a 68 y.o. yo male with history of RA, bronchiectasis, severe AS admitted from [facility] with shock, multiorgan failure and AKI.  #AKI, anuric: Suspect ATN from sepsis concomitant with acute cardiorenal syndrome/severe AS.  Discontinue CRRT on 12/14. Received IV Lasix with urine output only 90 cc and renal parameters worsening with hyponatremia and hyperkalemia.  His blood pressure is soft but better off pressor.  Plan to attempt intermittent hemodialysis today.  Starting midodrine and may need albumin during HD.  If patient does not tolerate IHD then he will probably need to resume CRRT.   Blood pressure still low.  Worsening BUN and creatinine level.  Respiratory status acceptable.  I am not sure if patient can tolerate outpatient HD given his severe AS and deconditioning.  #Acute shock likely cardiogenic and sepsis: Currently off pressors.  On antibiotics per primary team.  #Acute respiratory failure, history of bronchiectasis: Extubated.  #Severe AS: Follow-up cardiologist plan.  Not a candidate for TAVR.  Palliative care following  #Hyponatremia, hypervolemic: Sodium level worsening.  Starting HD today.  #Anemia of critical illness: Monitor hemoglobin level.  Discussed with ICU nurse.  Subjective: Received Lasix yesterday with urine output only 90 cc.  No new event.  Denies nausea vomiting chest pain.  Has some confusion. Objective Vital signs in last 24 hours: Vitals:   12/11/19 0500 12/11/19 0600 12/11/19 0700 12/11/19 0745  BP: (!) 100/49 (!) 98/34 (!) 113/36   Pulse: 89 88 91   Resp: 15 16 (!) 26   Temp:    98.7 F (37.1 C)  TempSrc:    Oral  SpO2: 100% 100% 100%   Weight:      Height:       Weight change: -1.1 kg  Intake/Output Summary (Last 24 hours) at 12/11/2019 0820 Last data filed at 12/11/2019 0600 Gross per 24 hour  Intake 77.09 ml  Output 90 ml  Net -12.91 ml       Labs: Basic  Metabolic Panel: Recent Labs  Lab 12/09/19 0451 12/10/19 0318 12/11/19 0221  NA 136 133* 128*  K 4.4 4.9 5.5*  CL 102 99 95*  CO2 25 26 24   GLUCOSE 118* 153* 287*  BUN 30* 43* 68*  CREATININE 1.16 2.08* 2.54*  CALCIUM 7.8* 8.3* 8.0*  PHOS 2.4* 3.6 4.9*   Liver Function Tests: Recent Labs  Lab 12/09/19 0451 12/10/19 0318 12/11/19 0221  AST 103* 78* 61*  ALT 208* 172* 129*  ALKPHOS 121 121 116  BILITOT 2.7* 2.3* 2.3*  PROT 5.4* 5.6* 5.2*  ALBUMIN 2.4* 2.5* 2.3*   No results for input(s): LIPASE, AMYLASE in the last 168 hours. Recent Labs  Lab 12/23/2019 2018  AMMONIA 30   CBC: Recent Labs  Lab 12/13/2019 1922 12/07/19 0451 12/08/19 0431 12/09/19 0451 12/09/19 1027 12/10/19 0950 12/11/19 0221  WBC 7.9 10.6* 9.7 10.1  --  11.6* 12.2*  NEUTROABS 7.1 9.9*  --   --   --   --   --   HGB 9.1* 9.5* 9.6* 9.5*  --  9.3* 8.4*  HCT 29.8* 30.1* 30.0* 30.1*  --  29.7* 27.1*  MCV 99.0 95.6 95.5 96.8  --  99.7 99.3  PLT 108* 122* 95* 69* 73* 72* 65*   Cardiac Enzymes: No results for input(s): CKTOTAL, CKMB, CKMBINDEX, TROPONINI in the last 168 hours. CBG: Recent Labs  Lab 12/10/19 1132 12/10/19 1514 12/10/19 2014 12/10/19 2245 12/11/19 0651  GLUCAP 105*  134* 233* 249* 227*    Iron Studies: No results for input(s): IRON, TIBC, TRANSFERRIN, FERRITIN in the last 72 hours. Studies/Results: ECHOCARDIOGRAM COMPLETE  Result Date: 12/09/2019   ECHOCARDIOGRAM REPORT   Patient Name:   [name] Coven Date of Exam: 12/09/2019 Medical Rec #:  [id number]    Height:       68.0 in Accession #:    [id number]   Weight:       174.4 lb Date of Birth:  16-Mar-1951    BSA:          1.93 m Patient Age:    34 years     BP:           83/56 mmHg Patient Gender: M            HR:           86 bpm. Exam Location:  Inpatient Procedure: 2D Echo Indications:    Aortic Stenosis  History:        Patient has prior history of Echocardiogram examinations, most                 recent 11/08/2019. CAD; Risk  Factors:Hypertension and Diabetes.  Sonographer:    Mikki Santee RDCS (AE) Referring Phys: [id number] [place]  1. Left ventricular ejection fraction, by visual estimation, is 25 to 30%. The left ventricle has severely decreased function. There is no left ventricular hypertrophy.  2. Left ventricular diastolic parameters are consistent with Grade II diastolic dysfunction (pseudonormalization).  3. Moderately dilated left ventricular internal cavity size.  4. The left ventricle demonstrates global hypokinesis.  5. Compared to echo from 11/08/19 LVEF is worse.  6. Global right ventricle has severely reduced systolic function.The right ventricular size is severely enlarged. No increase in right ventricular wall thickness.  7. Left atrial size was mildly dilated.  8. Right atrial size was normal.  9. The mitral valve is normal in structure. Moderate mitral valve regurgitation. 10. The tricuspid valve is normal in structure. Tricuspid valve regurgitation is mild. 11. The aortic valve is abnormal. Aortic valve regurgitation is not visualized. Severe aortic valve stenosis. 12. Poor acoustic windows limit study AV is calcified with restricted motion that appears to be severe. 13. The pulmonic valve was not well visualized. Pulmonic valve regurgitation is not visualized. FINDINGS  Left Ventricle: Left ventricular ejection fraction, by visual estimation, is 25 to 30%. The left ventricle has severely decreased function. The left ventricle demonstrates global hypokinesis. The left ventricular internal cavity size was moderately dilated left ventricle. There is no left ventricular hypertrophy. Left ventricular diastolic parameters are consistent with Grade II diastolic dysfunction (pseudonormalization). Compared to echo from 11/08/19 LVEF is worse. Right Ventricle: The right ventricular size is severely enlarged. No increase in right ventricular wall thickness. Global RV systolic function is has severely  reduced systolic function. Left Atrium: Left atrial size was mildly dilated. Right Atrium: Right atrial size was normal in size Pericardium: There is no evidence of pericardial effusion. Mitral Valve: The mitral valve is normal in structure. Moderate mitral valve regurgitation. Tricuspid Valve: The tricuspid valve is normal in structure. Tricuspid valve regurgitation is mild. Aortic Valve: The aortic valve is abnormal. Aortic valve regurgitation is not visualized. Aortic regurgitation PHT measures 231 msec. Severe aortic stenosis is present. Aortic valve mean gradient measures 34.0 mmHg. Aortic valve peak gradient measures 57.8 mmHg. Aortic valve area, by VTI measures 0.74 cm. Poor acoustic windows limit study AV is calcified with restricted motion that  appears to be severe. Pulmonic Valve: The pulmonic valve was not well visualized. Pulmonic valve regurgitation is not visualized. Pulmonic regurgitation is not visualized. Aorta: The aortic root is normal in size and structure. IAS/Shunts: No atrial level shunt detected by color flow Doppler.  LEFT VENTRICLE PLAX 2D LVIDd:         5.70 cm  Diastology LVIDs:         4.90 cm  LV e' lateral:   5.76 cm/s LV PW:         0.90 cm  LV E/e' lateral: 21.0 LV IVS:        0.70 cm  LV e' medial:    3.89 cm/s LVOT diam:     2.00 cm  LV E/e' medial:  31.1 LV SV:         47 ml LV SV Index:   24.07 LVOT Area:     3.14 cm  RIGHT VENTRICLE RV S prime:     5.27 cm/s TAPSE (M-mode): 0.6 cm LEFT ATRIUM              Index       RIGHT ATRIUM           Index LA diam:        4.30 cm  2.23 cm/m  RA Area:     19.30 cm LA Vol (A2C):   121.0 ml 62.76 ml/m RA Volume:   52.90 ml  27.44 ml/m LA Vol (A4C):   73.0 ml  37.86 ml/m LA Biplane Vol: 94.2 ml  48.86 ml/m  AORTIC VALVE AV Area (Vmax):    0.73 cm AV Area (Vmean):   0.75 cm AV Area (VTI):     0.74 cm AV Vmax:           380.00 cm/s AV Vmean:          276.500 cm/s AV VTI:            0.800 m AV Peak Grad:      57.8 mmHg AV Mean Grad:       34.0 mmHg LVOT Vmax:         88.40 cm/s LVOT Vmean:        65.700 cm/s LVOT VTI:          0.189 m LVOT/AV VTI ratio: 0.24 AI PHT:            231 msec  AORTA Ao Root diam: 3.00 cm MITRAL VALVE                         TRICUSPID VALVE MV Area (PHT): 5.13 cm              TR Peak grad:   30.7 mmHg MV PHT:        42.92 msec            TR Vmax:        299.00 cm/s MV Decel Time: 148 msec MR Peak grad: 114.9 mmHg             SHUNTS MR Mean grad: 69.0 mmHg              Systemic VTI:  0.19 m MR Vmax:      536.00 cm/s            Systemic Diam: 2.00 cm MR Vmean:     385.0 cm/s MV E velocity: 121.00 cm/s 103 cm/s MV A velocity: 79.30 cm/s  70.3 cm/s MV  E/A ratio:  1.53        1.5  Dorris Carnes MD Electronically signed by Dorris Carnes MD Signature Date/Time: 12/09/2019/3:49:31 PM    Final     Medications: Infusions: . sodium chloride    . ceFEPime (MAXIPIME) IV 1 g (12/10/19 1216)  . heparin    . lactated ringers Stopped (12/10/19 8588)    Scheduled Medications: . chlorhexidine  15 mL Mouth Rinse BID  . Chlorhexidine Gluconate Cloth  6 each Topical Q2200  . Chlorhexidine Gluconate Cloth  6 each Topical Q0600  . feeding supplement (ENSURE ENLIVE)  237 mL Oral BID BM  . heparin injection (subcutaneous)  5,000 Units Subcutaneous Q8H  . hydrocortisone sod succinate (SOLU-CORTEF) inj  50 mg Intravenous Daily  . hydroxychloroquine  200 mg Oral Daily  . insulin aspart  0-5 Units Subcutaneous QHS  . insulin aspart  0-9 Units Subcutaneous TID WC  . mouth rinse  15 mL Mouth Rinse q12n4p  . methadone  10 mg Oral Q6H  . midodrine  10 mg Oral TID WC  . multivitamin with minerals  1 tablet Oral Daily  . sodium chloride flush  10-40 mL Intracatheter Q12H  . sodium chloride flush  10-40 mL Intracatheter Q12H  . sodium chloride HYPERTONIC  4 mL Nebulization BID  . vancomycin variable dose per unstable renal function (pharmacist dosing)   Does not apply See admin instructions    have reviewed scheduled and prn  medications.  Physical Exam: General: Not in distress, lying in bed comfortable, somewhat confused today. Heart:RRR, s1s2 nl Lungs: Bibasal coarse breath sound, no wheezing Abdomen:soft, Non-tender, non-distended Extremities: Trace LE edema Dialysis Access: Right IJ temporary catheter placed on 12/12.  Hasson Gaspard Prasad Gerik Coberly 12/11/2019,8:20 AM  LOS: 5 days  Pager: [phone]

## 2019-12-11 NOTE — Evaluation (Signed)
Physical Therapy Evaluation Patient Details Name: [name] MRN: [id number] DOB: August 25, 1951 Today's Date: 12/11/2019   History of Present Illness  68 y.o. male with medical history significant for rheumatoid arthritis, hypertension, coronary artery disease, diabetes mellitus, diastolic and systolic CHF, severe aortic stenosis, CKD 3, left above-knee amputation. Pt presenting with increasing confusion, severe aortic stenosis, shock, CHF.  Clinical Impression  Pt presents to PT with deficits in functional mobility, strength, power, ROM, cognition, endurance, and balance. Pt requires physical assistance to perform all functional mobility, with knee flexion contractures and significant UE ROM deficits noted. Pt remains altered, with inconsistent reports of PLOF and inappropriate conversation at times during session. Pt is unable to maintain static sitting balance at this time without assistance. Pt will benefit from continued acute PT POC to reduce caregiver burden and improve mobility.    Follow Up Recommendations SNF;Supervision/Assistance - 24 hour    Equipment Recommendations  Hospital bed;Other (comment)(mechanincal lift if pt does not own one currently)    Recommendations for Other Services       Precautions / Restrictions Precautions Precautions: Fall Restrictions Weight Bearing Restrictions: No Other Position/Activity Restrictions: L BKA      Mobility  Bed Mobility Overal bed mobility: Needs Assistance Bed Mobility: Supine to Sit     Supine to sit: Total assist;+2 for physical assistance        Transfers Overall transfer level: Needs assistance Equipment used: None Transfers: Lateral/Scoot Transfers          Lateral/Scoot Transfers: Total assist;+2 physical assistance;From elevated surface    Ambulation/Gait                Stairs            Wheelchair Mobility    Modified Rankin (Stroke Patients Only)       Balance Overall balance  assessment: Needs assistance Sitting-balance support: Bilateral upper extremity supported;Feet unsupported Sitting balance-Leahy Scale: Poor Sitting balance - Comments: min-modA static sitting   Standing balance support: (deferred standing 2/2 imbalance at this time)                                 Pertinent Vitals/Pain Pain Assessment: No/denies pain    Home Living Family/patient expects to be discharged to:: Private residence Living Arrangements: Spouse/significant other Available Help at Discharge: Family;Available 24 hours/day Type of Home: House Home Access: Ramped entrance     Home Layout: One level Home Equipment: Wheelchair - power(difficult to determine DME 2/2 AMS)      Prior Function Level of Independence: Needs assistance   Gait / Transfers Assistance Needed: Pt reports transferring from bed to chair with assistance of spouse, also reports walkign with use of prosthetic about a month ago. Past chart review in 2017 reports hoyer lift for transfers, may need to confirm functional status with family due to [place].           Hand Dominance   Dominant Hand: Left    Extremity/Trunk Assessment   Upper Extremity Assessment Upper Extremity Assessment: Defer to OT evaluation    Lower Extremity Assessment Lower Extremity Assessment: RLE deficits/detail;LLE deficits/detail RLE Deficits / Details: ~10 degree knee flexion contracture, grossly 3/5 strength RLE Sensation: decreased light touch;history of peripheral neuropathy LLE Deficits / Details: L BKA, 20 degree knee flexion contracture LLE Sensation: history of peripheral neuropathy    Cervical / Trunk Assessment Cervical / Trunk Assessment: Kyphotic  Communication   Communication:  No difficulties  Cognition Arousal/Alertness: Awake/alert(although maintaining eyes closed for most of session) Behavior During Therapy: WFL for tasks assessed/performed Overall Cognitive Status: No family/caregiver  present to determine baseline cognitive functioning                                 General Comments: pt with significant AMS, disoriented to time, situation. Pt with conflicting reports of history during session.      General Comments General comments (skin integrity, edema, etc.): VSS on room air    Exercises     Assessment/Plan    PT Assessment Patient needs continued PT services  PT Problem List Decreased strength;Decreased range of motion;Decreased activity tolerance;Decreased balance;Decreased mobility;Decreased knowledge of use of DME;Decreased safety awareness;Decreased knowledge of precautions;Cardiopulmonary status limiting activity;Impaired sensation       PT Treatment Interventions DME instruction;Gait training;Functional mobility training;Therapeutic activities;Therapeutic exercise;Balance training;Neuromuscular re-education;Patient/family education;Wheelchair mobility training    PT Goals (Current goals can be found in the Care Plan section)  Acute Rehab PT Goals Patient Stated Goal: To improve strenght and mobility PT Goal Formulation: With patient Time For Goal Achievement: 12/25/19 Potential to Achieve Goals: Fair Additional Goals Additional Goal #1: Pt will mobilize in power wheelchair for 50' with minA for steering and wheelchair controls, to improve mobility and safety in the home.    Frequency Min 2X/week   Barriers to discharge        Co-evaluation               AM-PAC PT "6 Clicks" Mobility  Outcome Measure Help needed turning from your back to your side while in a flat bed without using bedrails?: Total Help needed moving from lying on your back to sitting on the side of a flat bed without using bedrails?: Total Help needed moving to and from a bed to a chair (including a wheelchair)?: Total Help needed standing up from a chair using your arms (e.g., wheelchair or bedside chair)?: Total Help needed to walk in hospital room?:  Total Help needed climbing 3-5 steps with a railing? : Total 6 Click Score: 6    End of Session Equipment Utilized During Treatment: (none) Activity Tolerance: Patient tolerated treatment well Patient left: in chair;with call bell/phone within reach Nurse Communication: Mobility status;Need for lift equipment PT Visit Diagnosis: Muscle weakness (generalized) (M62.81);Other abnormalities of gait and mobility (R26.89)    Time: 9937-1696 PT Time Calculation (min) (ACUTE ONLY): 29 min   Charges:   PT Evaluation $PT Eval High Complexity: 1 High          Zenaida Niece, PT, DPT Acute Rehabilitation Pager: [phone]   Zenaida Niece 12/11/2019, 1:01 PM

## 2019-12-11 NOTE — Progress Notes (Signed)
Daily Progress Note   Patient Name: [name]       Date: 12/11/2019 DOB: September 23, 1951  Age: 68 y.o. MRN#: [id number] Attending Physician: Candee Furbish, MD Primary Care Physician: [name], MD Admit Date: 12/16/2019  Reason for Consultation/Follow-up: Establishing goals of care  Subjective/GOC: Patient drowsy this afternoon. Resting during visit.  Wife at bedside. Discussed plan of care including diagnoses, interventions, and plan to attempt dialysis today. We again discussed concern that kidneys are shutting down from underlying severe aortic stenosis. Discussed watchful waiting and reassured of palliative follow-up throughout his hospitalization. Wife has Hard Choices booklet and PMT contact information.   Length of Stay: 5  Current Medications: Scheduled Meds:  . chlorhexidine  15 mL Mouth Rinse BID  . Chlorhexidine Gluconate Cloth  6 each Topical Q2200  . Chlorhexidine Gluconate Cloth  6 each Topical Q0600  . feeding supplement (ENSURE ENLIVE)  237 mL Oral BID BM  . heparin injection (subcutaneous)  5,000 Units Subcutaneous Q8H  . hydrocortisone sod succinate (SOLU-CORTEF) inj  50 mg Intravenous Daily  . hydroxychloroquine  200 mg Oral Daily  . insulin aspart  0-5 Units Subcutaneous QHS  . insulin aspart  0-9 Units Subcutaneous TID WC  . mouth rinse  15 mL Mouth Rinse q12n4p  . methadone  10 mg Oral Q6H  . midodrine  10 mg Oral TID WC  . multivitamin with minerals  1 tablet Oral Daily  . sodium chloride flush  10-40 mL Intracatheter Q12H  . sodium chloride flush  10-40 mL Intracatheter Q12H  . sodium chloride HYPERTONIC  4 mL Nebulization BID  . vancomycin variable dose per unstable renal function (pharmacist dosing)   Does not apply See admin instructions     Continuous Infusions: . sodium chloride    . ceFEPime (MAXIPIME) IV 1 g (12/11/19 0851)  . heparin    . lactated ringers Stopped (12/10/19 0738)    PRN Meds: sodium chloride, acetaminophen **OR** acetaminophen, Gerhardt's butt cream, heparin, methadone, naLOXone (NARCAN)  injection, polyethylene glycol, polyvinyl alcohol, sodium chloride flush, sodium chloride flush  Physical Exam Vitals and nursing note reviewed.  Constitutional:      Appearance: He is ill-appearing.  HENT:     Head: Normocephalic and atraumatic.  Cardiovascular:     Rate and Rhythm: Regular rhythm.  Pulmonary:     Effort: No tachypnea, accessory muscle usage or respiratory distress.  Skin:    General: Skin is warm and dry.  Neurological:     Mental Status: He is easily aroused.     Comments: Opens eyes to voice, drowsy            Vital Signs: BP (!) 112/46   Pulse 91   Temp 98.1 F (36.7 C) (Oral)   Resp (!) 25   Ht 5\' 8"  (1.727 m)   Wt 78.7 kg   SpO2 98%   BMI 26.38 kg/m  SpO2: SpO2: 98 % O2 Device: O2 Device: Room Air(sat was 100% on 0.5 lpm [place], on room air now- RN aware) O2 Flow Rate: O2 Flow Rate (L/min): 1 L/min  Intake/output summary:   Intake/Output Summary (Last 24 hours) at 12/11/2019 1427 Last data filed at 12/11/2019 0600 Gross per 24 hour  Intake 70 ml  Output 90 ml  Net -20 ml   LBM: Last BM Date: 12/09/19 Baseline Weight: Weight: 82 kg Most recent weight: Weight: 78.7 kg       Palliative Assessment/Data: PPS 40%    Flowsheet Rows     Most Recent Value  Intake Tab  Referral Department  Critical care  Unit at Time of Referral  ICU  Palliative Care Primary Diagnosis  Cardiac  Palliative Care Type  New Palliative care  Reason for referral  Clarify Goals of Care  Date first seen by Palliative Care  12/10/19  Clinical Assessment  Palliative Performance Scale Score  40%  Psychosocial & Spiritual Assessment  Palliative Care Outcomes  Patient/Family meeting held?   Yes  Who was at the meeting?  patient and wife  Palliative Care Outcomes  Clarified goals of care, Provided psychosocial or spiritual support, ACP counseling assistance      Patient Active Problem List   Diagnosis Date Noted  . Shock ([place]) 12/10/2019  . Palliative care by specialist   . Acute respiratory failure ([place])   . Goals of care, counseling/discussion   . AKI (acute kidney injury) ([place]) 12/13/2019  . Thrombocytopenia ([place]) 11/25/2019  . Bronchiectasis ([place]) 11/25/2019  . DJD (degenerative joint disease) 11/25/2019  . Elevated IgE level 11/25/2019  . Type 2 diabetes mellitus with peripheral vascular disease ([place]) 09/11/2019  . Chronic diastolic heart failure ([place]) 09/04/2019  . Pressure injury of skin 09/04/2019  . Pacemaker   . Aortic valve stenosis   . Heart block AV third degree ([place])   . Opioid dependence on maintenance agonist therapy, no symptoms ([place]) 07/01/2019  . Elevated troponin 07/01/2019  . LBBB (left bundle branch block) 07/01/2019  . Current chronic use of systemic steroids 07/01/2019  . Loculated pleural effusion 09/09/2016  . S/P BKA (below knee amputation) unilateral, left ([place]) 08/09/2016  . Status post amputation of toe of left foot ([place]) 04/27/2016  . Systolic heart failure ([place]) 07/09/2015  . Prolonged QT interval 08/14/2014  . Gait disorder 08/14/2014  . Seizure ([place]) 08/13/2014  . Normocytic anemia 08/13/2014  . Closed dislocation of metatarsophalangeal (joint) 03/26/2013  . DM (diabetes mellitus) type II controlled with renal manifestation ([place]) 12/11/2012  . Hyperlipidemia 12/11/2012  . Coronary artery disease   . Rheumatoid arthritis ([place])   . Hypertension     Palliative Care Assessment & Plan   Patient Profile: 68 y.o. male  with past medical history of rheumatoid arthritis, chronic combined CHF EF 40%, CAD, CABG x3 (2016), severe aortic stenosis, HTN, CKD III, PAD s/p  left BKA admitted on 11/26/2019 from [facility] with shock, multiorgan  failure requiring intubation/mechanical ventilation and CRRT. Extubated 12/13, CRRT stopped 12/14. Being treated with antibiotics for pseudomonas and MRSA tracheobronchitis vs. Pneumonia. Shock respiratory failure/liver improved. Patient's kidney function worsening on 12/15. Nephrology following. Plan for Lasix 80mg  IV today and concern that he may need intermittent hemodialysis. Patient with severe aortic stenosis. Cardiology following. TAVR team evaluated and given patient's multiple co-morbidities, he is not felt to be a candidate for TAVR. Recommending palliative medicine consultation for goals of care.   Assessment: Septic shock Pseudomonas and MRSA tracheobronchitis vs pneumonia Severe aortic stenosis (not a candidate for TAVR) AKI (likely multifactorial from ATN and cardiorenal) Severe protein calorie malnutrition Dysphagia Shock liver RA Anemia of chronic disease Thrombocytopenia DM with hyperglycemia Hx of CAD, CABG Hx of left BKA  Recommendations/Plan:  GOC discussion with patient/wife on 12/10/19. Patient/wife adamant for ongoing FULL code/FULL scope treatment and wish for all offered and available medical interventions including dialysis.   Patient with multiple chronic conditions for many years and has also been critically ill multiple times in the past. [name] has continued to survive and surprise providers. This is why he and wife continue to desire FULL code/FULL scope treatment.   Worsening renal function with poor urine output. Nephrology to attempt IHD today. This will likely be challenging with underlying severe aortic stenosis and soft blood pressures.   Ongoing palliative discussions. PMT will continue to follow.  Goals of Care and Additional Recommendations:  Limitations on Scope of Treatment: Full Scope Treatment  Code Status: FULL    Code Status Orders  (From admission, onward)         Start     Ordered   12/06/19 0311  Full code  Continuous      12/06/19 0310        Code Status History    Date Active Date Inactive Code Status Order ID Comments User Context   10/27/2019 1812 10/28/2019 2044 Full Code [id number]  Rudean Curt, MD Inpatient   09/11/2019 2350 09/15/2019 2027 Full Code [id number]  Rise Patience, MD ED   08/29/2019 1617 09/04/2019 2130 Full Code [id number]  Abigail Butts., PA-C ED   07/01/2019 1943 07/03/2019 1917 Full Code [id number]  Etta Quill, DO ED   10/15/2016 0744 10/23/2016 0312 Full Code [id number]  Oswald Hillock, MD Inpatient   09/02/2016 1918 09/05/2016 1503 Full Code [id number]  Vianne Bulls, MD ED   08/26/2016 1210 08/30/2016 1853 Full Code [id number]  Orvan Falconer, MD Inpatient   08/13/2014 0336 08/14/2014 1744 Full Code [id number]  Truett Mainland, DO Inpatient   12/11/2012 0312 12/11/2012 2058 Full Code [id number]  Karlyn Agee, MD Inpatient   Advance Care Planning Activity       Prognosis:   Poor prognosis  Discharge Planning:  To Be Determined  Care plan was discussed with patient, wife, RN  Thank you for allowing the Palliative Medicine Team to assist in the care of this patient.   Time In: 1345 Time Out: 1405 Total Time 20 Prolonged Time Billed no      Greater than 50%  of this time was spent counseling and coordinating care related to the above assessment and plan.  Ihor Dow, DNP, FNP-C Palliative Medicine Team  Phone: [phone] Fax: [phone]  Please contact Palliative Medicine Team phone at [phone] for questions and concerns.

## 2019-12-11 NOTE — Progress Notes (Addendum)
NAME:  [name], MRN:  [id number], DOB:  10-11-1951, LOS: 5 ADMISSION DATE:  12/20/2019, CONSULTATION DATE:  12/07/2019 REFERRING MD:  TRH, CHIEF COMPLAINT: Altered mental status  Brief History   68 yo male with extensive PMH as below admitted from [facility] 12/11 in septic shock and multiorgan failure.  Required intubation and CRRT.  Rapid improvement, extubated 12/13 and CRRT stopped 12/14.  Past Medical History  Rheumatoid arthritis, hypertension, coronary artery disease s/p CABG x 4, diabetes mellitus, diastolic and systolic CHF, severe aortic stenosis, complete heart block status post pacer, CKD 3, PAD s/p left above-knee amputation, loculated pleural effusion  Recently started on voriconazole in early December 2020 for Candida tropicalis infection/colonization of lung.   Prior to this admission, being worked up for possible [place] [facility] Events   12/10 > Admitted to [facility] 12/11 > Transferred to [facility] 12/12 > Initiated CVVH, off pressors 12/13 > extubated  12/14 > stopped CRRT 12/16 to start iHD Consults:  Nephrology PCCM Cardiology Palliative  Procedures:  ETT 12/11 >> 12/13 Right femoral CVL 12/11 >> Right IJ HD catheter 12/12 >>  Significant Diagnostic Tests:  12/10 [facility] >>chronic small vessel disease without acute intracranial abnormality  12/10 CT a/p >> 1. Mild gastric distension with air-fluid level, can be seen with gastroenteritis or gastroparesis. No gastric wall thickening or evidence of gastric outlet obstruction. 2. Small volume of ascites in the abdomen and pelvis, new from prior CT. Mild body wall edema. 3. Large stool burden with colonic tortuosity, suggesting constipation. No bowel obstruction. 4. Bilateral pleural effusions. Irregular streaky consolidative airspace opacities in both lower lobes, increased from CT last month. Pneumonia or aspiration considered. 5. Severe aortic and branch atherosclerosis. Aortic Atherosclerosis    Micro Data:  12/10 SARS2 >> neg 12/11 MRSA PCR >> neg 12/11 RVP >> neg 12/10 UC >> neg 12/10 BCx 2 >> 12/11 trach asp >> MRSA and pseudomonas  Antimicrobials:  Ceftriaxone 12/10 >>12/11 Doxycycline 12/10  >> 12/11 Vancomycin 12/11 >> Meropenem 12/12 >> Anidulafungin 1 dose 12/11  Interim history/subjective:  No complaints.  Attempting HD today but likely to require CRRT again.  He wants to pursue all avenues of treatment.  Objective   Blood pressure (Abnormal) 113/36, pulse 91, temperature 98.7 F (37.1 C), temperature source Oral, resp. rate (Abnormal) 26, height 5\' 8"  (1.727 m), weight 78.7 kg, SpO2 98 %.        Intake/Output Summary (Last 24 hours) at 12/11/2019 1111 Last data filed at 12/11/2019 0600 Gross per 24 hour  Intake 77.09 ml  Output 90 ml  Net -12.91 ml   Filed Weights   12/09/19 0600 12/10/19 0500 12/11/19 0300  Weight: 79.1 kg 79.8 kg 78.7 kg   Examination: GEN: Weak, frail elderly man sitting in chair. HEENT: MMM, poor dentition CV: RRR, +murmur, ext warm, pulses intact PULM: Scattered rhonchi, diminished bases.  No distress. EXT: Anasarca NEURO: Contracted upper ext, profoundly weak PSYCH: AOx3  SKIN: scattered bruising  Resolved Hospital Problem list   Cardiogenic shock Septic shock Septic coagulopathy Acute hypoxic respiratory failure  Assessment & Plan:    Multiorgan failure in the setting of septic shock 2/2 pseudomonas and MRSA tracheobronchitis vs pneumonia severe AS: (not a TAVR candidate ->see cards consult note)  AKI (likely a mix of cardiorenal syndrome and ATN)  Severe physical deconditioning  Severe protein calorie malnutrition  Dysphagia  Bronchiectasis w/ C tropicalis infection vs colonization (f/b Dr Carlis Abbott) Shock liver RA: on plaquenil and  chronic pred  Anemia of chronic disease  Thrombocytopenia  DM w/ hyperglycemia   discussion LFTs continue to improve.  Renal indices worsening.  Remains off the vent and on room  air.  Pressors off since 12/12.  He is not a candidate for TAVR per cards.  Coagulopathy has resolved, though he remains anemic, thrombocytopenic.  Need to make sure he can tolerate iHD before we move him out of ICU  Plan Wean steroids (work back to 5mg  pred daily over next few days) Continue vanc (day 6) and mero (day 5) x 10 days Continue CPT and hypertonic nebs Nephrology following, will attempt HD today.  He is not likely to tolerate HD, especially not on an outpatient basis, and will likely require restarting CRRT.  Will increase ssi to resistant scale   Follow up goals of care discussion.  Per palliative note, he and his wife wish to continue to pursue all avenues of treatment.  Need to ensure they have a good understanding of realistic quality of life possibilities.     Best practice:  Diet: Dysphagia 3 Pain/Anxiety/Delirium protocol (if indicated): chronic methadone VAP protocol (if indicated): n/a DVT prophylaxis: Resume heparin GI prophylaxis: Yes Glucose control: SSI Mobility: PT/ OT Code Status: Full Family Communication: will update later today Disposition: Keep in ICU pending blood pressure tolerance of diuretics plus/minus resumption of CRRT   Erick Colace ACNP-BC [facility] Pager # [phone] OR # [phone] if no answer

## 2019-12-11 NOTE — Progress Notes (Signed)
Received call from HD that [name]'s session is being moved to tomorrow per nephrology.

## 2019-12-12 ENCOUNTER — Ambulatory Visit: Payer: Medicare HMO | Admitting: Internal Medicine

## 2019-12-12 DIAGNOSIS — I953 Hypotension of hemodialysis: Secondary | ICD-10-CM

## 2019-12-12 LAB — CBC
HCT: 24.8 % — ABNORMAL LOW (ref 39.0–52.0)
Hemoglobin: 7.8 g/dL — ABNORMAL LOW (ref 13.0–17.0)
MCH: 30.2 pg (ref 26.0–34.0)
MCHC: 31.5 g/dL (ref 30.0–36.0)
MCV: 96.1 fL (ref 80.0–100.0)
Platelets: 62 10*3/uL — ABNORMAL LOW (ref 150–400)
RBC: 2.58 MIL/uL — ABNORMAL LOW (ref 4.22–5.81)
RDW: 19.7 % — ABNORMAL HIGH (ref 11.5–15.5)
WBC: 14.5 10*3/uL — ABNORMAL HIGH (ref 4.0–10.5)
nRBC: 0.7 % — ABNORMAL HIGH (ref 0.0–0.2)

## 2019-12-12 LAB — APTT: aPTT: 32 seconds (ref 24–36)

## 2019-12-12 LAB — COMPREHENSIVE METABOLIC PANEL
ALT: 93 U/L — ABNORMAL HIGH (ref 0–44)
AST: 42 U/L — ABNORMAL HIGH (ref 15–41)
Albumin: 3.2 g/dL — ABNORMAL LOW (ref 3.5–5.0)
Alkaline Phosphatase: 107 U/L (ref 38–126)
Anion gap: 11 (ref 5–15)
BUN: 31 mg/dL — ABNORMAL HIGH (ref 8–23)
CO2: 27 mmol/L (ref 22–32)
Calcium: 7.9 mg/dL — ABNORMAL LOW (ref 8.9–10.3)
Chloride: 95 mmol/L — ABNORMAL LOW (ref 98–111)
Creatinine, Ser: 1.14 mg/dL (ref 0.61–1.24)
GFR calc Af Amer: >60 mL/min (ref >60)
GFR calc non Af Amer: >60 mL/min (ref >60)
Glucose, Bld: 129 mg/dL — ABNORMAL HIGH (ref 70–99)
Potassium: 3.4 mmol/L — ABNORMAL LOW (ref 3.5–5.1)
Sodium: 133 mmol/L — ABNORMAL LOW (ref 135–145)
Total Bilirubin: 2.9 mg/dL — ABNORMAL HIGH (ref 0.3–1.2)
Total Protein: 5.9 g/dL — ABNORMAL LOW (ref 6.5–8.1)

## 2019-12-12 LAB — GLUCOSE, CAPILLARY
Glucose-Capillary: 122 mg/dL — ABNORMAL HIGH (ref 70–99)
Glucose-Capillary: 137 mg/dL — ABNORMAL HIGH (ref 70–99)
Glucose-Capillary: 141 mg/dL — ABNORMAL HIGH (ref 70–99)
Glucose-Capillary: 155 mg/dL — ABNORMAL HIGH (ref 70–99)
Glucose-Capillary: 165 mg/dL — ABNORMAL HIGH (ref 70–99)
Glucose-Capillary: 167 mg/dL — ABNORMAL HIGH (ref 70–99)
Glucose-Capillary: 178 mg/dL — ABNORMAL HIGH (ref 70–99)
Glucose-Capillary: 198 mg/dL — ABNORMAL HIGH (ref 70–99)

## 2019-12-12 LAB — POTASSIUM: Potassium: 4.4 mmol/L (ref 3.5–5.1)

## 2019-12-12 LAB — MAGNESIUM: Magnesium: 2.4 mg/dL (ref 1.7–2.4)

## 2019-12-12 LAB — PHOSPHORUS: Phosphorus: 1.8 mg/dL — ABNORMAL LOW (ref 2.5–4.6)

## 2019-12-12 LAB — VANCOMYCIN, RANDOM: Vancomycin Rm: 18

## 2019-12-12 MED ORDER — CHLORHEXIDINE GLUCONATE CLOTH 2 % EX PADS
6.0000 | MEDICATED_PAD | Freq: Every day | CUTANEOUS | Status: DC
  Administered 2019-12-12 – 2019-12-17 (×6): 6 via TOPICAL

## 2019-12-12 MED ORDER — ALBUMIN HUMAN 25 % IV SOLN
INTRAVENOUS | Status: AC
  Administered 2019-12-12: 25 g
  Filled 2019-12-12: qty 100

## 2019-12-12 MED ORDER — HEPARIN SODIUM (PORCINE) 1000 UNIT/ML IJ SOLN
INTRAMUSCULAR | Status: AC
  Administered 2019-12-12: 1000 [IU]
  Filled 2019-12-12: qty 4

## 2019-12-12 MED ORDER — NOREPINEPHRINE 4 MG/250ML-% IV SOLN
INTRAVENOUS | Status: AC
  Filled 2019-12-12: qty 250

## 2019-12-12 MED ORDER — NOREPINEPHRINE 4 MG/250ML-% IV SOLN
0.0000 ug/min | INTRAVENOUS | Status: DC
  Administered 2019-12-12: 6 ug/min via INTRAVENOUS
  Administered 2019-12-12: 07:00:00 3 ug/min via INTRAVENOUS
  Administered 2019-12-13 – 2019-12-15 (×2): 2 ug/min via INTRAVENOUS
  Administered 2019-12-15: 6 ug/min via INTRAVENOUS
  Filled 2019-12-12 (×2): qty 250

## 2019-12-12 NOTE — Progress Notes (Signed)
Contacted phlebotomy to draw blood for a Vanc level, tech was unable to obtain a sample and indicated that she would have another tech attempt a draw. Put in a consult to IV team for a PIV since [facility] has one access line running Levophed.

## 2019-12-12 NOTE — Progress Notes (Addendum)
NAME:  [name], MRN:  [id number], DOB:  01-24-1951, LOS: 6 ADMISSION DATE:  12/16/2019, CONSULTATION DATE:  12/07/2019 REFERRING MD:  TRH, CHIEF COMPLAINT: Altered mental status  Brief History   68 yo male with extensive PMH as below admitted from [facility] on 12/11 in septic shock and multiorgan failure.  Required intubation and CRRT.  Rapid improvement, extubated 12/13 and CRRT stopped 12/14.  Past Medical History  Rheumatoid arthritis, hypertension, coronary artery disease s/p CABG x 4, diabetes mellitus, diastolic and systolic CHF, severe aortic stenosis, complete heart block status post pacer, CKD 3, PAD s/p left above-knee amputation, loculated pleural effusion  Recently started on voriconazole in early December 2020 for Candida tropicalis infection/colonization of lung.   Prior to this admission, being worked up for possible [facility] Events   12/10 > Admitted to [facility] 12/11 > Transferred to [facility] 12/12 > Initiated CVVH, off pressors 12/13 > extubated  12/14 > stopped CRRT 12/16 > to start iHD,did not tolerate from a hemodynamics standpoint.   Consults:  Nephrology PCCM Cardiology Palliative  Procedures:  ETT 12/11 >> 12/13 Right femoral CVL 12/11 >> Right IJ HD catheter 12/12 >>  Significant Diagnostic Tests:  12/10 [facility] >>chronic small vessel disease without acute intracranial abnormality 12/10 CT a/p >>  Mild gastric distension with air-fluid level, can be seen with gastroenteritis or gastroparesis. No gastric wall thickening or evidence of gastric outlet obstruction. Small volume of ascitesin the abdomen and pelvis, new from prior CT. Mild body wall edema. Large stool burden with colonic tortuosity, suggesting constipation. No bowel obstruction. Bilateral pleural effusions. Irregular streaky consolidative airspace opacities in both lower lobes, increased from CT last month. Pneumonia or aspiration considered. Severe aortic and branch  atherosclerosis. Aortic Atherosclerosis   Micro Data:  12/10 SARS2 >> neg 12/11 MRSA PCR >> neg 12/11 RVP >> neg 12/10 UC >> neg 12/10 BCx 2 >>  12/11 trach asp >> MRSA and pseudomonas  Antimicrobials:  Ceftriaxone 12/10 >>12/11 Doxycycline 12/10  >> 12/11 Vancomycin 12/11 >> Meropenem 12/12 >> Anidulafungin 1 dose 12/11  Interim history/subjective:  Did not tolerate HD from a hemodynamic standpoint. Required pressors again overnight.  Objective   Blood pressure (!) 84/50, pulse (!) 57, temperature 98.8 F (37.1 C), temperature source Oral, resp. rate 15, height 5\' 8"  (1.727 m), weight 78.2 kg, SpO2 100 %.        Intake/Output Summary (Last 24 hours) at 12/12/2019 0903 Last data filed at 12/12/2019 0500 Gross per 24 hour  Intake 873.22 ml  Output 500 ml  Net 373.22 ml   Filed Weights   12/11/19 0300 12/12/19 0034 12/12/19 0314  Weight: 78.7 kg 78.7 kg 78.2 kg   Examination:  GEN: Frail elderly male resting in bed HEENT: [place]/AT, PERRL, no JVD CV: RRR, 4/6 SM PULM: Scattered rhonchi no distress EXT: Improved edema. Scrotal edema persists. L BKA NEURO: Alert, follows commands.  PSYCH: AOx3  SKIN: Scattered bruising. Weeping puncture site from old femoral access line.   Resolved Hospital Problem list   Cardiogenic shock Septic shock Septic coagulopathy Acute hypoxic respiratory failure   Assessment & Plan:   Multiorgan failure in the setting of septic shock 2/2 pseudomonas and MRSA tracheobronchitis vs pneumonia - back on Norepi overnight. MAP goal 65 - Tele monitoring - Continue vanc (day 7) and mero (day 6) x 10 days  severe AS: (not a TAVR candidate pre cardiology see cards consult note)  - Supportive care: will likely complicate all  other aspects of his care.   AKI (likely a mix of cardiorenal syndrome and ATN)  - nephrology following - Hopeful for iHD, but did not tolerate 12/16.  - Continue midorine  Severe physical deconditioning  Severe protein  calorie malnutrition  Dysphagia  - PMT involved - Diet pending SLP eval today  Bronchiectasis w/ C tropicalis infection vs colonization (f/b Dr Carlis Abbott) - Continue CPT and hypertonic nebs  RA: on plaquenil and chronic pred - Wean steroids (work back to 5mg  pred daily over next few days) - Continue plaquenil and methadone   Anemia of chronic disease  Thrombocytopenia  - no indication for transfusion today.   DM w/ hyperglycemia  - SSI resistant  Goals of care: - PMT following, Long term prognosis rather poor, but family and patient are wishing for aggressive scope of treatment. D/w wife today. She is aware he did not tolerate HD. She is hopeful that as he completes his course of ABX, the infectious component of shock and AKI can improve to the point he no longer needs HD or can tolerate it. She also understands if he does not improve despite finishing his course of ABX, he may simply not be a candidate for HD and may need to pursue a different goal to his care.    Best practice:  Diet: Dysphagia 3 Pain/Anxiety/Delirium protocol (if indicated): chronic methadone VAP protocol (if indicated): n/a DVT prophylaxis: Resume heparin GI prophylaxis: Yes Glucose control: SSI Mobility: PT/ OT Code Status: Full Family Communication: updated 12/17 as above Disposition: ICU  Critical care time 45 mins  Georgann Housekeeper, AGACNP-BC [place] for personal pager PCCM on call pager [phone]  12/12/2019 9:46 AM

## 2019-12-12 NOTE — Progress Notes (Signed)
Pharmacy Antibiotic Note  [name] is a 68 y.o. male admitted on 12/20/2019 with sepsis. Pt started on meropenem and vancomycin which was de-escalated to cefepime and vancomycin 12/14 with Pseudomonas and MRSA growing within tracheal aspirates. Pt previously on CRRT for acute on chronic renal failure which was stopped 12/14. Furosemide challenge failed, iHD aborted after 1hr due to hemodynamics on 12/17 am. Vancomycin level today is 18 mcg/ml, will not redose for now.   Plan: -Continue cefepime 1g IV q24h  -Hold further vancomycin pending RRT plans -Planning 10 total days of treatment (through 12/21)  Height: 5\' 8"  (172.7 cm) Weight: 172 lb 6.4 oz (78.2 kg) IBW/kg (Calculated) : 68.4  Temp (24hrs), Avg:98.4 F (36.9 C), Min:97.6 F (36.4 C), Max:99.2 F (37.3 C)  Recent Labs  Lab 11/28/2019 2018 12/06/19 0127 12/06/19 0449 12/06/19 1327 12/06/19 1336 12/06/19 1644 12/08/19 0431 12/08/19 1530 12/09/19 0451 12/10/19 0318 12/10/19 0930 12/10/19 0950 12/11/19 0221 12/12/19 0338 12/12/19 1007  WBC  --   --  11.2*   < >  --   --  9.7  --  10.1  --   --  11.6* 12.2* 14.5*  --   CREATININE  --   --  4.10*  --   --   --  2.46* 1.58* 1.16 2.08*  --   --  2.54* 1.14  --   LATICACIDVEN 2.3* 2.8* 3.0*  --  4.5* 3.8*  --   --   --   --   --   --   --   --   --   VANCORANDOM  --   --   --   --   --   --   --   --   --   --  18  --   --   --  18   < > = values in this interval not displayed.    Estimated Creatinine Clearance: 58.3 mL/min (by C-G formula based on SCr of 1.14 mg/dL).    Allergies  Allergen Reactions  . Sulfa Antibiotics Rash   Microbiology: 12/10 COVID - neg 12/10 BCx - ngtd 12/10 UCx - ngF 12/11 MRSA - neg 12/11 resp PCR - neg 12/11 TA: Pseudomonas (panS) + MRSA  Antimicrobials: CTX 12/10 >> 12/11 Doxy 12/10 >> 12/11 Vancomycin 12/12 >> Meropenem 12/12 >>12/14 Eraxis 12/12 x1 Cefepime 12/15 >>   Arrie Senate, PharmD, BCPS Clinical  Pharmacist [phone] Please check AMION for all [place] numbers 12/12/2019

## 2019-12-12 NOTE — Progress Notes (Signed)
[facility] NEPHROLOGY PROGRESS NOTE  Assessment/ Plan: Pt is a 68 y.o. yo male with history of RA, bronchiectasis, severe AS admitted from [facility] with shock, multiorgan failure and AKI.  #AKI, anuric: Suspect ATN from sepsis concomitant with acute cardiorenal syndrome/severe AS.  Discontinued CRRT on 12/14. Did not respond with IV diuretics. Tried intermittent hemodialysis on 12/16, did not tolerate with tachycardia and hypotension.  Only 500 cc UF. He is now on room air and electrolytes are okay.  Plan to hold off dialysis today.  He will need CRRT when he needs dialysis. I am not sure if patient can tolerate outpatient HD given his severe AS and deconditioning.  #Acute shock likely cardiogenic and sepsis: Currently off pressors.  On antibiotics per primary team.  Blood pressure still low.  #Acute respiratory failure, history of bronchiectasis: Extubated.  #Severe AS: Follow-up cardiologist plan.  Not a candidate for TAVR.  Palliative care following  #Hyponatremia, hypervolemic: Sodium level 130 today.  Monitor lab.  #Anemia of critical illness: Monitor hemoglobin level.  Check iron level.  Discussed with ICU nurse.  Subjective: Received only around 2 hours of dialysis yesterday and then had to stop because of hypotension and tachycardia.  He is on room air.  Denies shortness of breath, chest pain, nausea vomiting. Objective Vital signs in last 24 hours: Vitals:   12/12/19 0640 12/12/19 0655 12/12/19 0700 12/12/19 0705  BP: (!) 82/22 (!) 82/51 (!) 76/41 (!) 84/50  Pulse: (!) 113 64 (!) 56 (!) 57  Resp: 16 19 15 15   Temp:      TempSrc:      SpO2: 100% 100% 100% 100%  Weight:      Height:       Weight change: 0 kg  Intake/Output Summary (Last 24 hours) at 12/12/2019 0729 Last data filed at 12/12/2019 0500 Gross per 24 hour  Intake 1073.22 ml  Output 500 ml  Net 573.22 ml       Labs: Basic Metabolic Panel: Recent Labs  Lab 12/10/19 0318 12/11/19 0221  12/12/19 0338  NA 133* 128* 133*  K 4.9 5.5* 3.4*  CL 99 95* 95*  CO2 26 24 27   GLUCOSE 153* 287* 129*  BUN 43* 68* 31*  CREATININE 2.08* 2.54* 1.14  CALCIUM 8.3* 8.0* 7.9*  PHOS 3.6 4.9* 1.8*   Liver Function Tests: Recent Labs  Lab 12/10/19 0318 12/11/19 0221 12/12/19 0338  AST 78* 61* 42*  ALT 172* 129* 93*  ALKPHOS 121 116 107  BILITOT 2.3* 2.3* 2.9*  PROT 5.6* 5.2* 5.9*  ALBUMIN 2.5* 2.3* 3.2*   No results for input(s): LIPASE, AMYLASE in the last 168 hours. Recent Labs  Lab 11/29/2019 2018  AMMONIA 30   CBC: Recent Labs  Lab 12/14/2019 1922 12/07/19 0451 12/08/19 0431 12/09/19 0451 12/10/19 0950 12/11/19 0221 12/12/19 0338  WBC 7.9 10.6* 9.7 10.1 11.6* 12.2* 14.5*  NEUTROABS 7.1 9.9*  --   --   --   --   --   HGB 9.1* 9.5* 9.6* 9.5* 9.3* 8.4* 7.8*  HCT 29.8* 30.1* 30.0* 30.1* 29.7* 27.1* 24.8*  MCV 99.0 95.6 95.5 96.8 99.7 99.3 96.1  PLT 108* 122* 95* 69* 72* 65* 62*   Cardiac Enzymes: No results for input(s): CKTOTAL, CKMB, CKMBINDEX, TROPONINI in the last 168 hours. CBG: Recent Labs  Lab 12/11/19 1117 12/11/19 1516 12/11/19 2117 12/11/19 2342 12/12/19 0318  GLUCAP 271* 240* 251* 216* 122*    Iron Studies: No results for input(s): IRON, TIBC,  TRANSFERRIN, FERRITIN in the last 72 hours. Studies/Results: No results found.  Medications: Infusions: . sodium chloride 10 mL/hr at 12/12/19 0500  . ceFEPime (MAXIPIME) IV Stopped (12/11/19 7408)  . heparin    . lactated ringers Stopped (12/10/19 0738)  . norepinephrine    . norepinephrine (LEVOPHED) Adult infusion 3 mcg/min (12/12/19 0723)    Scheduled Medications: . chlorhexidine  15 mL Mouth Rinse BID  . Chlorhexidine Gluconate Cloth  6 each Topical Q2200  . feeding supplement (ENSURE ENLIVE)  237 mL Oral BID BM  . heparin injection (subcutaneous)  5,000 Units Subcutaneous Q8H  . hydrocortisone sod succinate (SOLU-CORTEF) inj  50 mg Intravenous Daily  . hydroxychloroquine  200 mg Oral Daily   . insulin aspart  0-5 Units Subcutaneous QHS  . insulin aspart  0-9 Units Subcutaneous TID WC  . mouth rinse  15 mL Mouth Rinse q12n4p  . methadone  10 mg Oral Q6H  . midodrine  10 mg Oral TID WC  . multivitamin with minerals  1 tablet Oral Daily  . sodium chloride flush  10-40 mL Intracatheter Q12H  . sodium chloride flush  10-40 mL Intracatheter Q12H  . sodium chloride HYPERTONIC  4 mL Nebulization BID  . vancomycin variable dose per unstable renal function (pharmacist dosing)   Does not apply See admin instructions    have reviewed scheduled and prn medications.  Physical Exam: General: Lying on bed comfortable, not in distress Heart:RRR, s1s2 nl Lungs: Clear anteriorly, no wheezing Abdomen:soft, Non-tender, non-distended Extremities: Trace LE edema Dialysis Access: Right IJ temporary catheter placed on 12/12.  Javar Eshbach Prasad Trennon Torbeck 12/12/2019,7:29 AM  LOS: 6 days  Pager: [phone]

## 2019-12-12 NOTE — Progress Notes (Signed)
  Speech Language Pathology Treatment: Dysphagia  Patient Details Name: [name] MRN: [id number] DOB: 02-Sep-1951 Today's Date: 12/12/2019 Time: 7096-2836 SLP Time Calculation (min) (ACUTE ONLY): 21 min  Assessment / Plan / Recommendation Clinical Impression  Asked by RN to see pt today - last night pt was having more difficulty swallowing per report.  Upon arrival today, pt alert and communicative, talking about his past.  He accepted boluses of applesauce and drank 3 oz of water from a straw without any difficulty and presented with no s/s of aspiration.  RN was present and meds were crushed, delivered with applesauce with no observed difficulty.   Performance today was improved from session documented on 12/15, during which pt required more verbal cueing.  Pt's swallowing function appears to wax and wane depending on overall MS.  Recommend continuing current diet; crush meds; feed pt at his pace; HOLD tray when MS impacts safe eating.  D/w RN.   HPI HPI: [name] is a 68 y.o. male with medical history significant for rheumatoid arthritis, hypertension, coronary artery disease, diabetes mellitus, diastolic and systolic CHF, severe aortic stenosis, CKD 3, left above-knee amputation.  Admitted from [facility] with septic shock, multiorgan failure.  Pt has baseline dysphonia.  Wife reports they have seen a throat doctor (presume ENT) who told them muscles for vocal cords are weak. Pt has chronic cough at baseline as well per hx obtained by evaluating SLP at [facility].      SLP Plan  Continue with current plan of care       Recommendations  Diet recommendations: Dysphagia 3 (mechanical soft);Thin liquid Liquids provided via: Cup;Straw Medication Administration: Crushed with puree Supervision: Trained caregiver to feed patient Compensations: Minimize environmental distractions Postural Changes and/or Swallow Maneuvers: Seated upright 90 degrees                Oral Care Recommendations: Oral  care BID Follow up Recommendations: 24 hour supervision/assistance;Skilled Nursing facility SLP Visit Diagnosis: Dysphagia, unspecified (R13.10) Plan: Continue with current plan of care       GO                Juan Quam Laurice 12/12/2019, 10:12 AM  Estill Bamberg L. Tivis Ringer, [place] Office number [phone] Pager [phone]

## 2019-12-12 NOTE — Progress Notes (Addendum)
Pt dose not tolerated hemodialysis because pt HR increased to 130 at last 1 hour.Pt treatment terminated and  blood returned. HR 110.  Report given to [facility]. Lucina Mellow.  Dr. Posey Pronto notified.

## 2019-12-13 DIAGNOSIS — R570 Cardiogenic shock: Secondary | ICD-10-CM

## 2019-12-13 LAB — FERRITIN: Ferritin: 229 ng/mL (ref 24–336)

## 2019-12-13 LAB — CBC
HCT: 19.8 % — ABNORMAL LOW (ref 39.0–52.0)
HCT: 21.1 % — ABNORMAL LOW (ref 39.0–52.0)
Hemoglobin: 6.1 g/dL — CL (ref 13.0–17.0)
Hemoglobin: 6.7 g/dL — CL (ref 13.0–17.0)
MCH: 31.4 pg (ref 26.0–34.0)
MCH: 30.7 pg (ref 26.0–34.0)
MCHC: 30.8 g/dL (ref 30.0–36.0)
MCHC: 31.8 g/dL (ref 30.0–36.0)
MCV: 102.1 fL — ABNORMAL HIGH (ref 80.0–100.0)
MCV: 96.8 fL (ref 80.0–100.0)
Platelets: 79 10*3/uL — ABNORMAL LOW (ref 150–400)
Platelets: 83 10*3/uL — ABNORMAL LOW (ref 150–400)
RBC: 1.94 MIL/uL — ABNORMAL LOW (ref 4.22–5.81)
RBC: 2.18 MIL/uL — ABNORMAL LOW (ref 4.22–5.81)
RDW: 21.8 % — ABNORMAL HIGH (ref 11.5–15.5)
RDW: 23.5 % — ABNORMAL HIGH (ref 11.5–15.5)
WBC: 19.2 10*3/uL — ABNORMAL HIGH (ref 4.0–10.5)
WBC: 16.9 10*3/uL — ABNORMAL HIGH (ref 4.0–10.5)
nRBC: 3.2 % — ABNORMAL HIGH (ref 0.0–0.2)
nRBC: 4.3 % — ABNORMAL HIGH (ref 0.0–0.2)

## 2019-12-13 LAB — POCT I-STAT EG7
Acid-base deficit: 3 mmol/L — ABNORMAL HIGH (ref 0.0–2.0)
Bicarbonate: 21.5 mmol/L (ref 20.0–28.0)
Calcium, Ion: 1.08 mmol/L — ABNORMAL LOW (ref 1.15–1.40)
HCT: 21 % — ABNORMAL LOW (ref 39.0–52.0)
Hemoglobin: 7.1 g/dL — ABNORMAL LOW (ref 13.0–17.0)
O2 Saturation: 54 %
Patient temperature: 96.2
Potassium: 5.1 mmol/L (ref 3.5–5.1)
Sodium: 130 mmol/L — ABNORMAL LOW (ref 135–145)
TCO2: 23 mmol/L (ref 22–32)
pCO2, Ven: 35.2 mmHg — ABNORMAL LOW (ref 44.0–60.0)
pH, Ven: 7.389 (ref 7.250–7.430)
pO2, Ven: 27 mmHg — CL (ref 32.0–45.0)

## 2019-12-13 LAB — POCT ACTIVATED CLOTTING TIME
Activated Clotting Time: 136 seconds
Activated Clotting Time: 125 s
Activated Clotting Time: 120 seconds
Activated Clotting Time: 120 s
Activated Clotting Time: 120 s

## 2019-12-13 LAB — RENAL FUNCTION PANEL
Albumin: 2.3 g/dL — ABNORMAL LOW (ref 3.5–5.0)
Albumin: 2.1 g/dL — ABNORMAL LOW (ref 3.5–5.0)
Anion gap: 14 (ref 5–15)
Anion gap: 10 (ref 5–15)
BUN: 90 mg/dL — ABNORMAL HIGH (ref 8–23)
BUN: 83 mg/dL — ABNORMAL HIGH (ref 8–23)
CO2: 20 mmol/L — ABNORMAL LOW (ref 22–32)
CO2: 22 mmol/L (ref 22–32)
Calcium: 8 mg/dL — ABNORMAL LOW (ref 8.9–10.3)
Calcium: 7.6 mg/dL — ABNORMAL LOW (ref 8.9–10.3)
Chloride: 96 mmol/L — ABNORMAL LOW (ref 98–111)
Chloride: 98 mmol/L (ref 98–111)
Creatinine, Ser: 2.62 mg/dL — ABNORMAL HIGH (ref 0.61–1.24)
Creatinine, Ser: 2.3 mg/dL — ABNORMAL HIGH (ref 0.61–1.24)
GFR calc Af Amer: 27 mL/min — ABNORMAL LOW (ref >60)
GFR calc Af Amer: 32 mL/min — ABNORMAL LOW (ref >60)
GFR calc non Af Amer: 24 mL/min — ABNORMAL LOW (ref >60)
GFR calc non Af Amer: 28 mL/min — ABNORMAL LOW (ref >60)
Glucose, Bld: 164 mg/dL — ABNORMAL HIGH (ref 70–99)
Glucose, Bld: 301 mg/dL — ABNORMAL HIGH (ref 70–99)
Phosphorus: 4.9 mg/dL — ABNORMAL HIGH (ref 2.5–4.6)
Phosphorus: 5.1 mg/dL — ABNORMAL HIGH (ref 2.5–4.6)
Potassium: 5.3 mmol/L — ABNORMAL HIGH (ref 3.5–5.1)
Potassium: 4.9 mmol/L (ref 3.5–5.1)
Sodium: 130 mmol/L — ABNORMAL LOW (ref 135–145)
Sodium: 130 mmol/L — ABNORMAL LOW (ref 135–145)

## 2019-12-13 LAB — MAGNESIUM: Magnesium: 2.9 mg/dL — ABNORMAL HIGH (ref 1.7–2.4)

## 2019-12-13 LAB — GLUCOSE, CAPILLARY
Glucose-Capillary: 144 mg/dL — ABNORMAL HIGH (ref 70–99)
Glucose-Capillary: 150 mg/dL — ABNORMAL HIGH (ref 70–99)
Glucose-Capillary: 158 mg/dL — ABNORMAL HIGH (ref 70–99)
Glucose-Capillary: 161 mg/dL — ABNORMAL HIGH (ref 70–99)
Glucose-Capillary: 163 mg/dL — ABNORMAL HIGH (ref 70–99)
Glucose-Capillary: 232 mg/dL — ABNORMAL HIGH (ref 70–99)
Glucose-Capillary: 273 mg/dL — ABNORMAL HIGH (ref 70–99)
Glucose-Capillary: 274 mg/dL — ABNORMAL HIGH (ref 70–99)
Glucose-Capillary: 283 mg/dL — ABNORMAL HIGH (ref 70–99)

## 2019-12-13 LAB — CULTURE, RESPIRATORY W GRAM STAIN

## 2019-12-13 LAB — ABO/RH: ABO/RH(D): O POS

## 2019-12-13 LAB — PREPARE RBC (CROSSMATCH)

## 2019-12-13 LAB — APTT: aPTT: 35 seconds (ref 24–36)

## 2019-12-13 LAB — HEMOGLOBIN AND HEMATOCRIT, BLOOD
HCT: 19.8 % — ABNORMAL LOW (ref 39.0–52.0)
Hemoglobin: 6.2 g/dL — CL (ref 13.0–17.0)

## 2019-12-13 LAB — IRON AND TIBC
Iron: 39 ug/dL — ABNORMAL LOW (ref 45–182)
Saturation Ratios: 31 % (ref 17.9–39.5)
TIBC: 125 ug/dL — ABNORMAL LOW (ref 250–450)
UIBC: 86 ug/dL

## 2019-12-13 MED ORDER — VANCOMYCIN HCL IN DEXTROSE 1-5 GM/200ML-% IV SOLN
1000.0000 mg | INTRAVENOUS | Status: AC
  Administered 2019-12-13 – 2019-12-16 (×4): 1000 mg via INTRAVENOUS
  Filled 2019-12-13 (×4): qty 200

## 2019-12-13 MED ORDER — PRISMASOL BGK 4/2.5 32-4-2.5 MEQ/L REPLACEMENT SOLN: Status: DC

## 2019-12-13 MED ORDER — INSULIN ASPART 100 UNIT/ML ~~LOC~~ SOLN
0.0000 [IU] | SUBCUTANEOUS | Status: DC
  Administered 2019-12-13: 3 [IU] via SUBCUTANEOUS
  Administered 2019-12-13: 8 [IU] via SUBCUTANEOUS
  Administered 2019-12-14: 5 [IU] via SUBCUTANEOUS
  Administered 2019-12-14 (×2): 3 [IU] via SUBCUTANEOUS
  Administered 2019-12-14: 04:00:00 2 [IU] via SUBCUTANEOUS
  Administered 2019-12-14: 3 [IU] via SUBCUTANEOUS
  Administered 2019-12-15 (×2): 2 [IU] via SUBCUTANEOUS
  Administered 2019-12-15: 11 [IU] via SUBCUTANEOUS
  Administered 2019-12-15: 5 [IU] via SUBCUTANEOUS
  Administered 2019-12-16: 3 [IU] via SUBCUTANEOUS
  Administered 2019-12-16: 5 [IU] via SUBCUTANEOUS

## 2019-12-13 MED ORDER — SODIUM CHLORIDE 0.9% IV SOLUTION: Freq: Once | INTRAVENOUS | Status: AC

## 2019-12-13 MED ORDER — SODIUM CHLORIDE 0.9 % IV SOLN
2.0000 g | Freq: Two times a day (BID) | INTRAVENOUS | Status: AC
  Administered 2019-12-13 – 2019-12-16 (×7): 2 g via INTRAVENOUS
  Filled 2019-12-13 (×7): qty 2

## 2019-12-13 MED ORDER — HEPARIN SODIUM (PORCINE) 1000 UNIT/ML DIALYSIS
1000.0000 [IU] | INTRAMUSCULAR | Status: DC | PRN
  Administered 2019-12-17: 2400 [IU] via INTRAVENOUS_CENTRAL
  Filled 2019-12-13 (×2): qty 6
  Filled 2019-12-13: qty 4
  Filled 2019-12-13: qty 6

## 2019-12-13 MED ORDER — PRISMASOL BGK 4/2.5 32-4-2.5 MEQ/L IV SOLN: INTRAVENOUS | Status: DC

## 2019-12-13 MED ORDER — HEPARIN (PORCINE) 2000 UNITS/L FOR CRRT
INTRAVENOUS_CENTRAL | Status: DC | PRN
  Filled 2019-12-13 (×3): qty 1000

## 2019-12-13 NOTE — Progress Notes (Signed)
NAME:  [name], MRN:  [id number], DOB:  06/17/1951, LOS: 7 ADMISSION DATE:  12/06/2019, CONSULTATION DATE:  12/07/2019 REFERRING MD:  TRH, CHIEF COMPLAINT: Altered mental status  Brief History   68 yo male with extensive PMH as below admitted from [facility] on 12/11 in septic shock and multiorgan failure.  Required intubation and CRRT.  Rapid improvement, extubated 12/13 and CRRT stopped 12/14.   Significant Hospital Events   12/10 > Admitted to [facility] 12/11 > Transferred to [facility] 12/12 > Initiated CVVH, off pressors 12/13 > extubated  12/14 > stopped CRRT 12/16 > to start iHD,did not tolerate from a hemodynamics standpoint.  12/18 > back on CRRT, Levophed  Consults:  Nephrology PCCM Cardiology Palliative  Procedures:  ETT 12/11 >> 12/13 Right femoral CVL 12/11 >> Right IJ HD catheter 12/12 >>  Significant Diagnostic Tests:  12/10 [facility] >>chronic small vessel disease without acute intracranial abnormality 12/10 CT a/p >>  Mild gastric distension with air-fluid level, can be seen with gastroenteritis or gastroparesis. No gastric wall thickening or evidence of gastric outlet obstruction. Small volume of ascitesin the abdomen and pelvis, new from prior CT. Mild body wall edema. Large stool burden with colonic tortuosity, suggesting constipation. No bowel obstruction. Bilateral pleural effusions. Irregular streaky consolidative airspace opacities in both lower lobes, increased from CT last month. Pneumonia or aspiration considered. Severe aortic and branch atherosclerosis. Aortic Atherosclerosis   Micro Data:  12/10 SARS2 >> neg 12/11 MRSA PCR >> neg 12/11 RVP >> neg 12/10 UC >> neg 12/10 BCx 2 >> neg 12/12 trach asp >> MRSA and pseudomonas AFB 12/12 neg Fungal culture 12/12 >>>  Antimicrobials:  Ceftriaxone 12/10 >>12/11 Doxycycline 12/10  >> 12/11 Meropenem 12/12 >>12/14 Anidulafungin 1 dose 12/11 Cefepime 12/15  Vancomycin 12/11   Interim history/subjective:    Restarted on Levophed this AM. Awaiting blood transfusion for hgb 6.1. No complaints.   Objective   Blood pressure (!) 77/38, pulse (!) 55, temperature 97.7 F (36.5 C), temperature source Oral, resp. rate 20, height 5\' 8"  (1.727 m), weight 83 kg, SpO2 99 %.        Intake/Output Summary (Last 24 hours) at 12/13/2019 1002 Last data filed at 12/13/2019 0800 Gross per 24 hour  Intake 841.35 ml  Output 200 ml  Net 641.35 ml   Filed Weights   12/12/19 0034 12/12/19 0314 12/13/19 0500  Weight: 78.7 kg 78.2 kg 83 kg   Examination:  GEN: Frail elderly male in chair, weak.  HEENT: [place]/AT, PERRL, no JVD CV: RRR, 4/6 SM PULM: Clear, no disterss EXT: Scrotal edema, L BKA, otherwise intact NEURO: Alert, follows commands. Oriented to person place SKIN: Scattered bruising. Weeping R fem line site. No warmth or erythema.    Resolved Hospital Problem list   Cardiogenic shock Septic shock Septic coagulopathy Acute hypoxic respiratory failure   Assessment & Plan:   Multiorgan failure in the setting of septic shock 2/2 pseudomonas and MRSA tracheobronchitis vs pneumonia - Norepi to keep MAP goal > 65 - Tele monitoring - Continue vanc Cefepime x 10 days (through 12/21)  severe AS: (not a TAVR candidate pre cardiology see cards consult note)  - Supportive care  AKI (likely a mix of cardiorenal syndrome and ATN). If not able to tolerate iHD, or be liberated altogether once infection/sepsis treated, will require palliative discussion.  - nephrology following, restarting CRRT today.  - Continue midorine  Severe physical deconditioning  Severe protein calorie malnutrition  Dysphagia  - PMT  involved - Diet  Bronchiectasis w/ C tropicalis infection vs colonization (f/b Dr Carlis Abbott) - Continue CPT  RA: on plaquenil and chronic pred - Keep steroid dose for now while in shock.  - Wean steroids (work back to 5mg  pred daily over next few days) - Continue plaquenil and methadone   Anemia of  chronic disease  Thrombocytopenia  - Transfuse one unit PRBC today before HD.   DM w/ hyperglycemia  - SSI resistant  Goals of care: - PMT following, Long term prognosis rather poor, but family and patient are wishing for aggressive scope of treatment. D/w wife today 12/18. She is aware he did not tolerate HD. She is hopeful that as he completes his course of ABX, the infectious component of shock and AKI can improve to the point he no longer needs HD or can tolerate it. She also understands if he does not improve despite finishing his course of ABX, he may simply not be a candidate for HD and may need to pursue a different goal to his care. She would like to get him home ultimately.    Best practice:  Diet: Dysphagia 3 Pain/Anxiety/Delirium protocol (if indicated): chronic methadone VAP protocol (if indicated): n/a DVT prophylaxis: SQH GI prophylaxis: Yes Glucose control: SSI Mobility: PT/ OT Code Status: Full Family Communication: updated 12/18 as above Disposition: ICU  Critical care time 40 mins  Georgann Housekeeper, AGACNP-BC [place] for personal pager PCCM on call pager [phone]  12/13/2019 10:02 AM

## 2019-12-13 NOTE — Progress Notes (Signed)
Pharmacy Antibiotic Note  [name] is a 68 y.o. male admitted on 12/01/2019 with sepsis. Pt started on meropenem and vancomycin which was de-escalated to cefepime and vancomycin 12/14 with Pseudomonas and MRSA growing within tracheal aspirates. Pt previously on CRRT for acute on chronic renal failure which was stopped 12/14. Furosemide challenge failed, iHD aborted after 1hr due to hemodynamics on 12/17 am. Vancomycin level today is 18 mcg/ml 12/17 and not redosed CRRT restarted 12/18 will adjust ABX doses Previously planned stop 12/21 will remain    Plan: -increase  cefepime 2g IV q12h  Vnacomycin 1gm IV q24 (10mg /kg) q24hr restart today -Planning 10 total days of treatment (through 12/21)  Height: 5\' 8"  (172.7 cm) Weight: 182 lb 15.7 oz (83 kg) IBW/kg (Calculated) : 68.4  Temp (24hrs), Avg:97.1 F (36.2 C), Min:94.6 F (34.8 C), Max:98.1 F (36.7 C)  Recent Labs  Lab 12/06/19 1336 12/06/19 1644 12/09/19 0451 12/10/19 0318 12/10/19 0930 12/10/19 0950 12/11/19 0221 12/12/19 0338 12/12/19 1007 12/13/19 0455  WBC  --   --  10.1  --   --  11.6* 12.2* 14.5*  --  19.2*  CREATININE  --   --  1.16 2.08*  --   --  2.54* 1.14  --  2.62*  LATICACIDVEN 4.5* 3.8*  --   --   --   --   --   --   --   --   VANCORANDOM  --   --   --   --  18  --   --   --  18  --     Estimated Creatinine Clearance: 27.5 mL/min (A) (by C-G formula based on SCr of 2.62 mg/dL (H)).    Allergies  Allergen Reactions  . Sulfa Antibiotics Rash   Microbiology: 12/10 COVID - neg 12/10 BCx - ngtd 12/10 UCx - ngF 12/11 MRSA - neg 12/11 resp PCR - neg 12/11 TA: Pseudomonas (panS) + MRSA  Antimicrobials: CTX 12/10 >> 12/11 Doxy 12/10 >> 12/11 Vancomycin 12/12 >> Meropenem 12/12 >>12/14 Eraxis 12/12 x1 Cefepime 12/15 >>   Bonnita Nasuti Pharm.D. CPP, BCPS Clinical Pharmacist [phone] 12/13/2019 1:13 PM

## 2019-12-13 NOTE — Progress Notes (Signed)
[facility] NEPHROLOGY PROGRESS NOTE  Assessment/ Plan: Pt is a 68 y.o. yo male with history of RA, bronchiectasis, severe AS admitted from [facility] with shock, multiorgan failure and AKI.  #AKI, oliguric: Suspect ATN from sepsis concomitant with acute cardiorenal syndrome/severe AS.  Refractory to IV diuretics. CRRT DC'd on 12/14 and tried intermittent HD on 12/16.  Patient did not tolerate IHD.  Still remains oliguric with no sign of renal recovery.  Blood pressure is low.  He has uremia.  Plan to restart CRRT. I am not sure if patient can tolerate outpatient HD given his severe AS and deconditioning.  #Acute shock likely cardiogenic and sepsis: Currently off pressors, blood pressure is low.  On antibiotics per primary team.    #Acute respiratory failure, history of bronchiectasis: Extubated.  #Severe AS: Follow-up cardiologist plan.  Not a candidate for TAVR.  Palliative care following  #Hyponatremia, hypervolemic: Sodium level 130 today.  Monitor lab.  #Anemia of critical illness: Monitor hemoglobin level.  Iron sat 31%.  Receiving blood transfusion today.  Discussed with ICU nurse.  Subjective: Remains hypotensive.  Urine output only 200 cc.  Denies nausea vomiting chest pain.  Review of system limited.  Somewhat lethargic. Objective Vital signs in last 24 hours: Vitals:   12/13/19 0400 12/13/19 0500 12/13/19 0600 12/13/19 0700  BP: (!) 95/52 (!) 96/54 (!) 95/59 (!) 96/41  Pulse: (!) 38 (!) 110 (!) 110 72  Resp: 18 17 16 14   Temp:    97.7 F (36.5 C)  TempSrc:    Oral  SpO2: 100% 100% 100% 100%  Weight:  83 kg    Height:       Weight change: 4.3 kg  Intake/Output Summary (Last 24 hours) at 12/13/2019 0838 Last data filed at 12/13/2019 0500 Gross per 24 hour  Intake 1181.48 ml  Output 200 ml  Net 981.48 ml       Labs: Basic Metabolic Panel: Recent Labs  Lab 12/11/19 0221 12/12/19 0338 12/12/19 1007 12/13/19 0455  NA 128* 133*  --  130*  K 5.5*  3.4* 4.4 5.3*  CL 95* 95*  --  96*  CO2 24 27  --  20*  GLUCOSE 287* 129*  --  164*  BUN 68* 31*  --  90*  CREATININE 2.54* 1.14  --  2.62*  CALCIUM 8.0* 7.9*  --  8.0*  PHOS 4.9* 1.8*  --  4.9*   Liver Function Tests: Recent Labs  Lab 12/10/19 0318 12/11/19 0221 12/12/19 0338 12/13/19 0455  AST 78* 61* 42*  --   ALT 172* 129* 93*  --   ALKPHOS 121 116 107  --   BILITOT 2.3* 2.3* 2.9*  --   PROT 5.6* 5.2* 5.9*  --   ALBUMIN 2.5* 2.3* 3.2* 2.3*   No results for input(s): LIPASE, AMYLASE in the last 168 hours. No results for input(s): AMMONIA in the last 168 hours. CBC: Recent Labs  Lab 12/07/19 0451 12/07/19 0554 12/09/19 0451 12/10/19 0950 12/11/19 0221 12/12/19 0338 12/13/19 0455  WBC 10.6*   < > 10.1 11.6* 12.2* 14.5* 19.2*  NEUTROABS 9.9*  --   --   --   --   --   --   HGB 9.5*  --  9.5* 9.3* 8.4* 7.8* 6.1*  HCT 30.1*  --  30.1* 29.7* 27.1* 24.8* 19.8*  MCV 95.6   < > 96.8 99.7 99.3 96.1 102.1*  PLT 122*   < > 69* 72* 65* 62* 79*   < > =  values in this interval not displayed.   Cardiac Enzymes: No results for input(s): CKTOTAL, CKMB, CKMBINDEX, TROPONINI in the last 168 hours. CBG: Recent Labs  Lab 12/12/19 1959 12/12/19 2159 12/12/19 2351 12/13/19 0140 12/13/19 0339  GLUCAP 198* 178* 155* 158* 144*    Iron Studies:  Recent Labs    12/13/19 0455  IRON 39*  TIBC 125*  FERRITIN 229   Studies/Results: No results found.  Medications: Infusions: .  prismasol BGK 4/2.5    .  prismasol BGK 4/2.5    . sodium chloride Stopped (12/13/19 0459)  . ceFEPime (MAXIPIME) IV Stopped (12/12/19 1040)  . heparin    . lactated ringers Stopped (12/10/19 0738)  . norepinephrine (LEVOPHED) Adult infusion Stopped (12/12/19 1511)  . prismasol BGK 4/2.5      Scheduled Medications: . sodium chloride   Intravenous Once  . chlorhexidine  15 mL Mouth Rinse BID  . Chlorhexidine Gluconate Cloth  6 each Topical Q2200  . feeding supplement (ENSURE ENLIVE)  237 mL  Oral BID BM  . heparin injection (subcutaneous)  5,000 Units Subcutaneous Q8H  . hydrocortisone sod succinate (SOLU-CORTEF) inj  50 mg Intravenous Daily  . hydroxychloroquine  200 mg Oral Daily  . insulin aspart  0-5 Units Subcutaneous QHS  . insulin aspart  0-9 Units Subcutaneous TID WC  . mouth rinse  15 mL Mouth Rinse q12n4p  . methadone  10 mg Oral Q6H  . midodrine  10 mg Oral TID WC  . multivitamin with minerals  1 tablet Oral Daily  . sodium chloride flush  10-40 mL Intracatheter Q12H  . sodium chloride flush  10-40 mL Intracatheter Q12H  . vancomycin variable dose per unstable renal function (pharmacist dosing)   Does not apply See admin instructions    have reviewed scheduled and prn medications.  Physical Exam: General: Lethargic, weak, not in distress Heart:RRR, s1s2 nl Lungs: Clear anteriorly, no wheezing Abdomen:soft, Non-tender, non-distended Extremities: Trace LE edema Dialysis Access: Right IJ temporary catheter placed on 12/12.  Teran Knittle Prasad Ayaan Ringle 12/13/2019,8:38 AM  LOS: 7 days  Pager: [phone]

## 2019-12-13 NOTE — Progress Notes (Signed)
eLink Physician-Brief Progress Note Patient Name: [name] DOB: [birth year] MRN: [id number]   Date of Service  12/13/2019  HPI/Events of Note  Anemia - Hgb 6.7.  eICU Interventions  Will order: 1. Transfuse 1 unit PRBC.  2. H/H at 12:30 AM.        Lysle Dingwall 12/13/2019, 9:23 PM

## 2019-12-13 NOTE — Progress Notes (Signed)
CRITICAL VALUE ALERT  Critical Value:  Hemoglobin   Date & Time Notied:  12/13/19 700  Provider Notified: Elsworth Soho   Orders Received/Actions taken: Repeat [place] RN

## 2019-12-13 NOTE — Progress Notes (Signed)
eLink Physician-Brief Progress Note Patient Name: [name] DOB: Oct 10, 1951 MRN: [id number]   Date of Service  12/13/2019  HPI/Events of Note  Hyperglycemia - Blood glucose = 232 --> 274 --> 273. On PO diet, however, not eating meals consistently.   eICU Interventions  Will order: 1. Advance to Q 4 hour moderate Novolog SSI.     Intervention Category Major Interventions: Hyperglycemia - active titration of insulin therapy  Lysle Dingwall 12/13/2019, 8:37 PM

## 2019-12-13 NOTE — Progress Notes (Signed)
RN attempted to I & O twice with minimal UOP after bladder scan of 574 mL.  There was some sediment noted in the catheter. MD aware. Plans to restart CRRT. Hold on foley insertion for now. Recheck bladder scan qshift.  RN will continue to monitor patient.

## 2019-12-13 NOTE — Progress Notes (Signed)
Came to room x 3 this morning for CPT - pt unavailable.  Unable to perform CPT currently d/t pt sitting up in chair.

## 2019-12-13 NOTE — Progress Notes (Signed)
  Speech Language Pathology Treatment: Dysphagia  Patient Details Name: [name] MRN: [id number] DOB: [birth year] Today's Date: 12/13/2019 Time: 6222-9798 SLP Time Calculation (min) (ACUTE ONLY): 9 min  Assessment / Plan / Recommendation Clinical Impression  Patient seen for f/u diet tolerance assessment. RN in room administering meds. Patient with eyes closed throughout session but responsive. Required moderate verbal cueing for awareness of bolus, consistent labial seal, and for movement of bolus due to oral holding. Despite this however, patient without overt indication of aspiration with pharyngeal swallow seemingly intact. SLP offered 3-4 more sips of thin liquids via straw which patient consumed without overt indication of aspiration before stating "no more." Oral care complete to maximize safety. Will continue to f/u.     HPI HPI: [name] is a 68 y.o. male with medical history significant for rheumatoid arthritis, hypertension, coronary artery disease, diabetes mellitus, diastolic and systolic CHF, severe aortic stenosis, CKD 3, left above-knee amputation.  Admitted from [facility] with septic shock, multiorgan failure.  Pt has baseline dysphonia.  Wife reports they have seen a throat doctor (presume ENT) who told them muscles for vocal cords are weak. Pt has chronic cough at baseline as well per hx obtained by evaluating SLP at [facility]      SLP Plan  Continue with current plan of care       Recommendations  Diet recommendations: Dysphagia 3 (mechanical soft);Thin liquid Liquids provided via: Cup;Straw Medication Administration: Crushed with puree Supervision: Trained caregiver to feed patient Compensations: Minimize environmental distractions Postural Changes and/or Swallow Maneuvers: Seated upright 90 degrees                Oral Care Recommendations: Oral care BID Follow up Recommendations: 24 hour supervision/assistance;Skilled Nursing facility SLP Visit Diagnosis:  Dysphagia, unspecified (R13.10) Plan: Continue with current plan of care                   [facility] MA, CCC-SLP     Ronnesha Mester Meryl 12/13/2019, 9:48 AM

## 2019-12-13 NOTE — Progress Notes (Signed)
Physical Therapy Treatment Patient Details Name: [name] MRN: [id number] DOB: 1951-01-18 Today's Date: 12/13/2019    History of Present Illness 68 y.o. male with medical history significant for rheumatoid arthritis, hypertension, coronary artery disease, diabetes mellitus, diastolic and systolic CHF, severe aortic stenosis, CKD 3, left above-knee amputation. Pt presenting with increasing confusion, severe aortic stenosis, shock, CHF.    PT Comments    Pt limited by pain and AMS during session, lethargic and confused. Pt continues to require significant physical assistance to perform all functional mobility, with increased needs for assistance to maintain sitting balance and transfer. Pt remains confused and requires increased time and cueing to follow commands, maintaining eyes closed for most of session but intermittently opening on command. Pt will benefit from continued acute PT services to reduce caregiver burden and improve activity tolerance.   Follow Up Recommendations  SNF;Supervision/Assistance - 24 hour     Equipment Recommendations  Hospital bed;Other (comment)(mechanical lift)    Recommendations for Other Services       Precautions / Restrictions Precautions Precautions: Fall Restrictions Weight Bearing Restrictions: No RLE Weight Bearing: Weight bearing as tolerated Other Position/Activity Restrictions: L BKA    Mobility  Bed Mobility Overal bed mobility: Needs Assistance Bed Mobility: Supine to Sit     Supine to sit: Total assist;HOB elevated        Transfers Overall transfer level: Needs assistance Equipment used: (bed pad) Transfers: Lateral/Scoot Transfers          Lateral/Scoot Transfers: From elevated surface    Ambulation/Gait                 Stairs             Wheelchair Mobility    Modified Rankin (Stroke Patients Only)       Balance Overall balance assessment: Needs assistance Sitting-balance support: Feet  unsupported;No upper extremity supported Sitting balance-Leahy Scale: Zero Sitting balance - Comments: pt requiring totalA to maintain sitting balance, leaning in all directions with limited effort to maintain sitting balance. Pt also with limited head control noted during session, with head leaning toward R side                                    Cognition Arousal/Alertness: Lethargic Behavior During Therapy: Restless Overall Cognitive Status: Impaired/Different from baseline                                 General Comments: pt with confused and garbled speech during session, low speech volume limiting PTs understanding. When given choice of school or hospital pt states he is at a school. Pt following one step commands with increased time and reinforcement      Exercises      General Comments General comments (skin integrity, edema, etc.): Pt hypotensive with BP of 94/45 (60) prior to mobilizing, improving with sitting at edge of bed, and back near baseline after transfer. RN increasing pressors while PT in room. SpO2 with unreliable read and poor wave form during mobility at times, pt demonstrating no signs of respiratory distress.      Pertinent Vitals/Pain Pain Assessment: Faces Faces Pain Scale: Hurts even more Pain Location: buttocks and back Pain Descriptors / Indicators: Crying;Grimacing Pain Intervention(s): Limited activity within patient's tolerance    Home Living  Prior Function            PT Goals (current goals can now be found in the care plan section) Acute Rehab PT Goals Patient Stated Goal: to get out of bed Progress towards PT goals: Not progressing toward goals - comment(limited by pain and AMS)    Frequency    Min 2X/week      PT Plan Current plan remains appropriate    Co-evaluation              AM-PAC PT "6 Clicks" Mobility   Outcome Measure  Help needed turning from your back  to your side while in a flat bed without using bedrails?: Total Help needed moving from lying on your back to sitting on the side of a flat bed without using bedrails?: Total Help needed moving to and from a bed to a chair (including a wheelchair)?: Total Help needed standing up from a chair using your arms (e.g., wheelchair or bedside chair)?: Total Help needed to walk in hospital room?: Total Help needed climbing 3-5 steps with a railing? : Total 6 Click Score: 6    End of Session Equipment Utilized During Treatment: (none) Activity Tolerance: Patient limited by fatigue;Patient limited by pain Patient left: in chair;with call bell/phone within reach;with nursing/sitter in room Nurse Communication: Mobility status;Need for lift equipment PT Visit Diagnosis: Muscle weakness (generalized) (M62.81);Other abnormalities of gait and mobility (R26.89)     Time: 3790-2409 PT Time Calculation (min) (ACUTE ONLY): 20 min  Charges:  $Therapeutic Activity: 8-22 mins                     Zenaida Niece, PT, DPT Acute Rehabilitation Pager: [phone]    Zenaida Niece 12/13/2019, 10:09 AM

## 2019-12-14 DIAGNOSIS — M05711 Rheumatoid arthritis with rheumatoid factor of right shoulder without organ or systems involvement: Secondary | ICD-10-CM

## 2019-12-14 DIAGNOSIS — M05712 Rheumatoid arthritis with rheumatoid factor of left shoulder without organ or systems involvement: Secondary | ICD-10-CM

## 2019-12-14 LAB — RENAL FUNCTION PANEL
Albumin: 2.4 g/dL — ABNORMAL LOW (ref 3.5–5.0)
Albumin: 2.1 g/dL — ABNORMAL LOW (ref 3.5–5.0)
Anion gap: 8 (ref 5–15)
Anion gap: 5 (ref 5–15)
BUN: 53 mg/dL — ABNORMAL HIGH (ref 8–23)
BUN: 39 mg/dL — ABNORMAL HIGH (ref 8–23)
CO2: 25 mmol/L (ref 22–32)
CO2: 27 mmol/L (ref 22–32)
Calcium: 7.7 mg/dL — ABNORMAL LOW (ref 8.9–10.3)
Calcium: 7.5 mg/dL — ABNORMAL LOW (ref 8.9–10.3)
Chloride: 101 mmol/L (ref 98–111)
Chloride: 100 mmol/L (ref 98–111)
Creatinine, Ser: 1.57 mg/dL — ABNORMAL HIGH (ref 0.61–1.24)
Creatinine, Ser: 1.17 mg/dL (ref 0.61–1.24)
GFR calc Af Amer: 51 mL/min — ABNORMAL LOW (ref >60)
GFR calc Af Amer: >60 mL/min (ref >60)
GFR calc non Af Amer: 44 mL/min — ABNORMAL LOW (ref >60)
GFR calc non Af Amer: >60 mL/min (ref >60)
Glucose, Bld: 110 mg/dL — ABNORMAL HIGH (ref 70–99)
Glucose, Bld: 271 mg/dL — ABNORMAL HIGH (ref 70–99)
Phosphorus: 3.2 mg/dL (ref 2.5–4.6)
Phosphorus: 2.6 mg/dL (ref 2.5–4.6)
Potassium: 4.7 mmol/L (ref 3.5–5.1)
Potassium: 4.6 mmol/L (ref 3.5–5.1)
Sodium: 134 mmol/L — ABNORMAL LOW (ref 135–145)
Sodium: 132 mmol/L — ABNORMAL LOW (ref 135–145)

## 2019-12-14 LAB — APTT: aPTT: 39 seconds — ABNORMAL HIGH (ref 24–36)

## 2019-12-14 LAB — GLUCOSE, CAPILLARY
Glucose-Capillary: 129 mg/dL — ABNORMAL HIGH (ref 70–99)
Glucose-Capillary: 176 mg/dL — ABNORMAL HIGH (ref 70–99)
Glucose-Capillary: 176 mg/dL — ABNORMAL HIGH (ref 70–99)
Glucose-Capillary: 192 mg/dL — ABNORMAL HIGH (ref 70–99)
Glucose-Capillary: 218 mg/dL — ABNORMAL HIGH (ref 70–99)
Glucose-Capillary: 69 mg/dL — ABNORMAL LOW (ref 70–99)
Glucose-Capillary: 88 mg/dL (ref 70–99)

## 2019-12-14 LAB — CBC
HCT: 27.2 % — ABNORMAL LOW (ref 39.0–52.0)
Hemoglobin: 8.8 g/dL — ABNORMAL LOW (ref 13.0–17.0)
MCH: 30.8 pg (ref 26.0–34.0)
MCHC: 32.4 g/dL (ref 30.0–36.0)
MCV: 95.1 fL (ref 80.0–100.0)
Platelets: 77 10*3/uL — ABNORMAL LOW (ref 150–400)
RBC: 2.86 MIL/uL — ABNORMAL LOW (ref 4.22–5.81)
RDW: 22.4 % — ABNORMAL HIGH (ref 11.5–15.5)
WBC: 20.3 10*3/uL — ABNORMAL HIGH (ref 4.0–10.5)
nRBC: 5.5 % — ABNORMAL HIGH (ref 0.0–0.2)

## 2019-12-14 LAB — HEMOGLOBIN AND HEMATOCRIT, BLOOD
HCT: 27 % — ABNORMAL LOW (ref 39.0–52.0)
Hemoglobin: 8.9 g/dL — ABNORMAL LOW (ref 13.0–17.0)

## 2019-12-14 LAB — MAGNESIUM: Magnesium: 2.8 mg/dL — ABNORMAL HIGH (ref 1.7–2.4)

## 2019-12-14 LAB — POCT ACTIVATED CLOTTING TIME: Activated Clotting Time: 191 seconds

## 2019-12-14 MED ORDER — SODIUM CHLORIDE 0.9 % IV SOLN
250.0000 [IU]/h | INTRAVENOUS | Status: DC
  Administered 2019-12-14 (×2): 1000 [IU]/h via INTRAVENOUS_CENTRAL
  Administered 2019-12-14: 450 [IU]/h via INTRAVENOUS_CENTRAL
  Administered 2019-12-14: 650 [IU]/h via INTRAVENOUS_CENTRAL
  Administered 2019-12-14: 1000 [IU]/h via INTRAVENOUS_CENTRAL
  Administered 2019-12-14: 850 [IU]/h via INTRAVENOUS_CENTRAL
  Administered 2019-12-14: 1050 [IU]/h via INTRAVENOUS_CENTRAL
  Administered 2019-12-14: 250 [IU]/h via INTRAVENOUS_CENTRAL
  Administered 2019-12-14: 950 [IU]/h via INTRAVENOUS_CENTRAL
  Administered 2019-12-14: 1000 [IU]/h via INTRAVENOUS_CENTRAL
  Administered 2019-12-14: 550 [IU]/h via INTRAVENOUS_CENTRAL
  Administered 2019-12-14: 750 [IU]/h via INTRAVENOUS_CENTRAL
  Administered 2019-12-15 (×2): 1050 [IU]/h via INTRAVENOUS_CENTRAL
  Administered 2019-12-15: 1000 [IU]/h via INTRAVENOUS_CENTRAL
  Administered 2019-12-15: 1050 [IU]/h via INTRAVENOUS_CENTRAL
  Administered 2019-12-15: 900 [IU]/h via INTRAVENOUS_CENTRAL
  Administered 2019-12-15: 1050 [IU]/h via INTRAVENOUS_CENTRAL
  Administered 2019-12-15: 1000 [IU]/h via INTRAVENOUS_CENTRAL
  Administered 2019-12-15: 1050 [IU]/h via INTRAVENOUS_CENTRAL
  Filled 2019-12-14 (×4): qty 2

## 2019-12-14 MED ORDER — HEPARIN BOLUS VIA INFUSION (CRRT)
1000.0000 [IU] | INTRAVENOUS | Status: DC | PRN
  Administered 2019-12-14: 1000 [IU] via INTRAVENOUS_CENTRAL
  Filled 2019-12-14: qty 1000

## 2019-12-14 NOTE — Progress Notes (Signed)
[facility] NEPHROLOGY PROGRESS NOTE  Assessment/ Plan: Pt is a 68 y.o. yo male with history of RA, bronchiectasis, severe AS admitted from [facility] with shock, multiorgan failure and AKI.  #AKI, oliguric: Suspect ATN from sepsis concomitant with acute cardiorenal syndrome/severe AS.  Refractory to IV diuretics. CRRT DC'd on 12/14 and tried intermittent HD on 12/16.  Patient did not tolerate IHD.  Resumed CRRT on 12/18.  I will add heparin in the circuit as filter clotted overnight.  No systemic heparin because of severe anemia.   Still remains oliguric with no sign of renal recovery.  Blood pressure is low.  I am not sure if patient can tolerate outpatient HD given his severe AS and deconditioning.  #Acute shock likely cardiogenic and sepsis: Currently off pressors, blood pressure is low.  On antibiotics per primary team.    #Acute respiratory failure, history of bronchiectasis: Extubated.  #Severe AS: Follow-up cardiologist plan.  Not a candidate for TAVR.  Palliative care following  #Hyponatremia, hypervolemic: Now on CRRT.  #Anemia of critical illness: Receiving blood transfusion.  Iron sat 31%.   Discussed with ICU nurse.  Subjective: Filter clotted overnight.  Total UF around 1055 cc.  BP soft.  No new event. Objective Vital signs in last 24 hours: Vitals:   12/14/19 0630 12/14/19 0645 12/14/19 0700 12/14/19 0800  BP: (!) 112/55 (!) 104/58 (!) 104/50 (!) 104/46  Pulse: 70 68 67 66  Resp: 11 (!) 9 10 10   Temp:      TempSrc:      SpO2: 100% 100% 100% 100%  Weight:      Height:       Weight change: -2.2 kg  Intake/Output Summary (Last 24 hours) at 12/14/2019 0813 Last data filed at 12/14/2019 0800 Gross per 24 hour  Intake 1604.95 ml  Output 1241 ml  Net 363.95 ml       Labs: Basic Metabolic Panel: Recent Labs  Lab 12/13/19 0455 12/13/19 1415 12/13/19 1533 12/14/19 0516  NA 130* 130* 130* 134*  K 5.3* 5.1 4.9 4.7  CL 96*  --  98 101  CO2 20*   --  22 25  GLUCOSE 164*  --  301* 110*  BUN 90*  --  83* 53*  CREATININE 2.62*  --  2.30* 1.57*  CALCIUM 8.0*  --  7.6* 7.7*  PHOS 4.9*  --  5.1* 3.2   Liver Function Tests: Recent Labs  Lab 12/10/19 0318 12/11/19 0221 12/12/19 0338 12/13/19 0455 12/13/19 1533 12/14/19 0516  AST 78* 61* 42*  --   --   --   ALT 172* 129* 93*  --   --   --   ALKPHOS 121 116 107  --   --   --   BILITOT 2.3* 2.3* 2.9*  --   --   --   PROT 5.6* 5.2* 5.9*  --   --   --   ALBUMIN 2.5* 2.3* 3.2* 2.3* 2.1* 2.4*   No results for input(s): LIPASE, AMYLASE in the last 168 hours. No results for input(s): AMMONIA in the last 168 hours. CBC: Recent Labs  Lab 12/11/19 0221 12/12/19 0338 12/13/19 0455 12/13/19 1533 12/14/19 0110 12/14/19 0516  WBC 12.2* 14.5* 19.2* 16.9*  --  20.3*  HGB 8.4* 7.8* 6.1* 6.7* 8.9* 8.8*  HCT 27.1* 24.8* 19.8* 21.1* 27.0* 27.2*  MCV 99.3 96.1 102.1* 96.8  --  95.1  PLT 65* 62* 79* 83*  --  77*   Cardiac Enzymes:  No results for input(s): CKTOTAL, CKMB, CKMBINDEX, TROPONINI in the last 168 hours. CBG: Recent Labs  Lab 12/13/19 1727 12/13/19 1926 12/13/19 2045 12/13/19 2349 12/14/19 0337  GLUCAP 274* 273* 283* 163* 129*    Iron Studies:  Recent Labs    12/13/19 0455  IRON 39*  TIBC 125*  FERRITIN 229   Studies/Results: No results found.  Medications: Infusions: .  prismasol BGK 4/2.5 500 mL/hr at 12/13/19 2345  .  prismasol BGK 4/2.5 300 mL/hr at 12/13/19 1400  . sodium chloride Stopped (12/13/19 1101)  . ceFEPime (MAXIPIME) IV Stopped (12/13/19 2139)  . heparin    . lactated ringers Stopped (12/10/19 0738)  . norepinephrine (LEVOPHED) Adult infusion Stopped (12/13/19 2343)  . prismasol BGK 4/2.5 1,500 mL/hr at 12/13/19 2057  . vancomycin Stopped (12/13/19 1524)    Scheduled Medications: . chlorhexidine  15 mL Mouth Rinse BID  . Chlorhexidine Gluconate Cloth  6 each Topical Q2200  . feeding supplement (ENSURE ENLIVE)  237 mL Oral BID BM  .  heparin injection (subcutaneous)  5,000 Units Subcutaneous Q8H  . hydrocortisone sod succinate (SOLU-CORTEF) inj  50 mg Intravenous Daily  . hydroxychloroquine  200 mg Oral Daily  . insulin aspart  0-15 Units Subcutaneous Q4H  . mouth rinse  15 mL Mouth Rinse q12n4p  . methadone  10 mg Oral Q6H  . midodrine  10 mg Oral TID WC  . multivitamin with minerals  1 tablet Oral Daily  . sodium chloride flush  10-40 mL Intracatheter Q12H    have reviewed scheduled and prn medications.  Physical Exam: General: Lethargic, weak, not in distress Heart:RRR, s1s2 nl Lungs: Clear anteriorly, no wheezing Abdomen:soft, Non-tender, non-distended Extremities: Trace LE edema Dialysis Access: Right IJ temporary catheter placed on 12/12.  [name] [name] 12/14/2019,8:13 AM  LOS: 8 days  Pager: [phone]

## 2019-12-14 NOTE — Progress Notes (Signed)
NAME:  [name], MRN:  [id number], DOB:  Dec 30, 1950, LOS: 8 ADMISSION DATE:  12/22/2019, CONSULTATION DATE:  12/07/2019 REFERRING MD:  TRH, CHIEF COMPLAINT: Altered mental status  Brief History   68 yo male with extensive PMH as below admitted from [facility] on 12/11 in septic shock and multiorgan failure.  Required intubation and CRRT.  Rapid improvement, extubated 12/13 and CRRT stopped 12/14.   Significant Hospital Events   12/10 > Admitted to [facility] 12/11 > Transferred to [facility] 12/12 > Initiated CVVH, off pressors 12/13 > extubated  12/14 > stopped CRRT 12/16 > to start iHD,did not tolerate from a hemodynamics standpoint.  12/18 > back on CRRT, Levophed Restarted on Levophed this AM. Awaiting blood transfusion for hgb 6.1. No complaints.  Consults:  Nephrology PCCM Cardiology Palliative  Procedures:  ETT 12/11 >> 12/13 Right femoral CVL 12/11 >> Right IJ HD catheter 12/12 >>  Significant Diagnostic Tests:  12/10 [facility] >>chronic small vessel disease without acute intracranial abnormality 12/10 CT a/p >>  Mild gastric distension with air-fluid level, can be seen with gastroenteritis or gastroparesis. No gastric wall thickening or evidence of gastric outlet obstruction. Small volume of ascitesin the abdomen and pelvis, new from prior CT. Mild body wall edema. Large stool burden with colonic tortuosity, suggesting constipation. No bowel obstruction. Bilateral pleural effusions. Irregular streaky consolidative airspace opacities in both lower lobes, increased from CT last month. Pneumonia or aspiration considered. Severe aortic and branch atherosclerosis. Aortic Atherosclerosis   Micro Data:  12/10 SARS2 >> neg 12/11 MRSA PCR >> neg 12/11 RVP >> neg 12/10 UC >> neg 12/10 BCx 2 >> neg 12/12 trach asp >> MRSA and pseudomonas AFB 12/12 neg Fungal culture 12/12 >>>  Antimicrobials:  Ceftriaxone 12/10 >>12/11 Doxycycline 12/10  >> 12/11 Meropenem 12/12  >>12/14 Anidulafungin 1 dose 12/11 Cefepime 12/15  Vancomycin 12/11   Interim history/subjective:     12/19  - pall care cnsult - full code + /sp 1 unit prbc overnight. Back on CRRT . Remains extubaed. Wife giving him water. Denied any questions. Not on pressors  Objective   Blood pressure 129/69, pulse 72, temperature (!) 96.2 F (35.7 C), temperature source Axillary, resp. rate 18, height 5\' 8"  (1.727 m), weight 80.8 kg, SpO2 100 %.        Intake/Output Summary (Last 24 hours) at 12/14/2019 1239 Last data filed at 12/14/2019 1200 Gross per 24 hour  Intake 1772.4 ml  Output 1763 ml  Net 9.4 ml   Filed Weights   12/12/19 0314 12/13/19 0500 12/14/19 0500  Weight: 78.2 kg 83 kg 80.8 kg      General Appearance:  Looks frail and cahectic Head:  Normocephalic, without obvious abnormality, atraumatic Eyes:  PERRL - yes, conjunctiva/corneas - muddy     Ears:  Normal external ear canals, both ears Nose:  G tube - no Throat:  ETT TUBE - no , OG tube - no Neck:  Supple,  No enlargement/tenderness/nodules Lungs: Clear to auscultation bilaterally,  Heart:  S1 and S2 normal, murmur +, CVP - no.  Pressors - no Abdomen:  Soft, no masses, no organomegaly Genitalia / Rectal:  Not done Extremities:  Extremities- intact exepo L BKA Skin:  ntact in exposed areas . Sacral area - not examind Neurologic:  Sedation - none -> RASS - +1 . Moves all 4s - no. CAM-ICU - neg . Orientation - partial only. Ver weak      Resolved Hospital Problem list  Cardiogenic shock Septic shock Septic coagulopathy Acute hypoxic respiratory failure   Assessment & Plan:   Multiorgan failure in the setting of septic shock 2/2 pseudomonas and MRSA tracheobronchitis vs pneumonia  12/14/2019 - off pressors  PLAN - MAP goal > 65 - Tele monitoring - Continue vanc Cefepime x 10 days (through 12/21)  severe AS: (not a TAVR candidate pre cardiology see cards consult note)  - Supportive care  AKI  (likely a mix of cardiorenal syndrome and ATN)CRRT DC'd on 12/14 and tried intermittent HD on 12/16.  Patient did not tolerate IHD.  Resumed CRRT on 12/18. If not able to tolerate iHD, or be liberated altogether once infection/sepsis treated, will require palliative discussion.   12/14/2019 - CRRT +  PLAN - nephrology following, restarting CRRT today.  - Continue midorine  Severe physical deconditioning  Severe protein calorie malnutrition  Dysphagia  - PMT involved - Diet  Bronchiectasis w/ C tropicalis infection vs colonization (f/b Dr Carlis Abbott) - Continue CPT  RA: on plaquenil and chronic pred - - Wean steroids (work back to 5mg  pred daily over next few days) - Continue plaquenil and methadone   Anemia of chronic disease  Thrombocytopenia   12/14/2019 - s/p 1 unit probc overnight  plan - - PRBC for hgb </= 6.9gm%    - exceptions are   -  if ACS susepcted/confirmed then transfuse for hgb </= 8.0gm%,  or    -  active bleeding with hemodynamic instability, then transfuse regardless of hemoglobin value   At at all times try to transfuse 1 unit prbc as possible with exception of active hemorrhage     DM w/ hyperglycemia  - SSI resistant  Goals of care: - PMT following, Long term prognosis rather poor, but family and patient are wishing for aggressive scope of treatment. D/w wife today 12/18. She is aware he did not tolerate HD. She is hopeful that as he completes his course of ABX, the infectious component of shock and AKI can improve to the point he no longer needs HD or can tolerate it. She also understands if he does not improve despite finishing his course of ABX, he may simply not be a candidate for HD and may need to pursue a different goal to his care. She would like to get him home ultimately.   12/14/2019 - pall care meeting - full code   Best practice:  Diet: Dysphagia 3 Pain/Anxiety/Delirium protocol (if indicated): chronic methadone VAP protocol (if indicated):  n/a DVT prophylaxis: SQH GI prophylaxis: Yes Glucose control: SSI Mobility: PT/ OT Code Status: Full Family Communication: updated 12/18 as above. At bedside 12/19 wife denied questions Disposition: ICU   ATTESTATION & SIGNATURE     Dr. Brand Males, M.D., [facility].C.P Pulmonary and Critical Care Medicine Staff Physician [place] [facility] Pager: [phone], If no answer or between  15:00h - 7:00h: call [phone]  12/14/2019 12:43 PM     LABS    PULMONARY Recent Labs  Lab 12/08/19 0521 12/13/19 1415  HCO3 25.7 21.5  TCO2 27 23  O2SAT 59.0 54.0    CBC Recent Labs  Lab 12/13/19 0455 12/13/19 1533 12/14/19 0110 12/14/19 0516  HGB 6.1* 6.7* 8.9* 8.8*  HCT 19.8* 21.1* 27.0* 27.2*  WBC 19.2* 16.9*  --  20.3*  PLT 79* 83*  --  77*    COAGULATION Recent Labs  Lab 12/09/19 1027  INR 1.6*    CARDIAC  No results for input(s): TROPONINI in the last 168 hours. No results for input(s): PROBNP in the last 168 hours.   CHEMISTRY Recent Labs  Lab 12/10/19 0318 12/11/19 0221 12/12/19 0338 12/13/19 0455 12/13/19 1415 12/13/19 1533 12/14/19 0516  NA 133* 128* 133* 130* 130* 130* 134*  K 4.9 5.5* 3.4* 5.3* 5.1 4.9 4.7  CL 99 95* 95* 96*  --  98 101  CO2 26 24 27  20*  --  22 25  GLUCOSE 153* 287* 129* 164*  --  301* 110*  BUN 43* 68* 31* 90*  --  83* 53*  CREATININE 2.08* 2.54* 1.14 2.62*  --  2.30* 1.57*  CALCIUM 8.3* 8.0* 7.9* 8.0*  --  7.6* 7.7*  MG 3.0* 3.0* 2.4 2.9*  --   --  2.8*  PHOS 3.6 4.9* 1.8* 4.9*  --  5.1* 3.2   Estimated Creatinine Clearance: 42.4 mL/min (A) (by C-G formula based on SCr of 1.57 mg/dL (H)).   LIVER Recent Labs  Lab 12/08/19 0431 12/09/19 0451 12/09/19 1027 12/10/19 0318 12/11/19 0221 12/12/19 0338 12/13/19 0455 12/13/19 1533 12/14/19 0516  AST 159* 103*  --  78* 61* 42*  --   --   --   ALT 258* 208*  --  172* 129* 93*  --   --   --   ALKPHOS 134* 121  --  121 116  107  --   --   --   BILITOT 2.8* 2.7*  --  2.3* 2.3* 2.9*  --   --   --   PROT 5.5* 5.4*  --  5.6* 5.2* 5.9*  --   --   --   ALBUMIN 2.5* 2.4*  --  2.5* 2.3* 3.2* 2.3* 2.1* 2.4*  INR  --   --  1.6*  --   --   --   --   --   --      INFECTIOUS No results for input(s): LATICACIDVEN, PROCALCITON in the last 168 hours.   ENDOCRINE CBG (last 3)  Recent Labs    12/14/19 0836 12/14/19 1001 12/14/19 1207  GLUCAP 69* 88 176*         IMAGING x48h  - image(s) personally visualized  -   highlighted in bold No results found.

## 2019-12-14 NOTE — Progress Notes (Signed)
Pharmacist made aware of doctor wanting to add Heparin syringe with CRRT due to filter clotting over night. No issues with filter yet on my shift but will continue to monitor.

## 2019-12-14 NOTE — Progress Notes (Signed)
Daily Progress Note   Patient Name: [name]       Date: 12/14/2019 DOB: 12-30-1950  Age: 68 y.o. MRN#: [id number] Attending Physician: Candee Furbish, MD Primary Care Physician: [name], MD Admit Date: 12/04/2019  Reason for Consultation/Follow-up: Establishing goals of care  Subjective/GOC: Patient somewhat lethargic and weak. Will arouse to verbal stimuli. Able to appropriately identify self. Complains of pain all over. He is currently undergoing CRRT with [facility] in use.   Wife is at the bedside. States he always complains of pain due to his RA. She reports patient seems much more awake and alert over the past few days which she is pleased about.   I again attempted to discuss concerns regarding patient's renal failure, hypotension, and inability to possibly tolerate HD. She verbalized understanding of current state expressing her goals to continue with full scope treatment and watchfully wait to see how he does. Mrs. Schroeter reports she feels he is doing somewhat better and is tolerating "this" dialysis (CRRT) better than he did the IHD. She again expresses she is remaining hopeful and this is her focus at this point.    Chart Reviewed and Updates received and given to RN.   Length of Stay: 8  Current Medications: Scheduled Meds:   chlorhexidine  15 mL Mouth Rinse BID   Chlorhexidine Gluconate Cloth  6 each Topical Q2200   feeding supplement (ENSURE ENLIVE)  237 mL Oral BID BM   heparin injection (subcutaneous)  5,000 Units Subcutaneous Q8H   hydrocortisone sod succinate (SOLU-CORTEF) inj  50 mg Intravenous Daily   hydroxychloroquine  200 mg Oral Daily   insulin aspart  0-15 Units Subcutaneous Q4H   mouth rinse  15 mL Mouth Rinse q12n4p   methadone  10 mg  Oral Q6H   midodrine  10 mg Oral TID WC   multivitamin with minerals  1 tablet Oral Daily   sodium chloride flush  10-40 mL Intracatheter Q12H    Continuous Infusions:   prismasol BGK 4/2.5 500 mL/hr at 12/13/19 2345    prismasol BGK 4/2.5 300 mL/hr at 12/13/19 1400   sodium chloride Stopped (12/13/19 1101)   ceFEPime (MAXIPIME) IV Stopped (12/14/19 0949)   heparin 10,000 units/ 20 mL infusion syringe 250 Units/hr (12/14/19 1044)   heparin  lactated ringers Stopped (12/10/19 7846)   norepinephrine (LEVOPHED) Adult infusion Stopped (12/13/19 2343)   prismasol BGK 4/2.5 1,500 mL/hr at 12/13/19 2057   vancomycin Stopped (12/13/19 1524)    PRN Meds: sodium chloride, acetaminophen **OR** acetaminophen, Gerhardt's butt cream, heparin, heparin, heparin, heparin, methadone, naLOXone (NARCAN)  injection, polyethylene glycol, polyvinyl alcohol, sodium chloride flush  Physical Exam Vitals and nursing note reviewed.  Constitutional:      Appearance: He is ill-appearing.  Cardiovascular:     Rate and Rhythm: Regular rhythm.  Pulmonary:     Effort: No tachypnea or accessory muscle usage.  Skin:    General: Skin is warm and dry.  Neurological:     Mental Status: He is easily aroused.     Comments: Lethargic            Vital Signs: BP 129/70    Pulse 73    Temp (!) 96.2 F (35.7 C) (Axillary)    Resp 20    Ht 5\' 8"  (1.727 m)    Wt 80.8 kg    SpO2 100%    BMI 27.08 kg/m  SpO2: SpO2: 100 % O2 Device: O2 Device: Room Air O2 Flow Rate: O2 Flow Rate (L/min): 1 L/min  Intake/output summary:   Intake/Output Summary (Last 24 hours) at 12/14/2019 1214 Last data filed at 12/14/2019 1100 Gross per 24 hour  Intake 1772.4 ml  Output 1536 ml  Net 236.4 ml   LBM: Last BM Date: 12/14/19 Baseline Weight: Weight: 82 kg Most recent weight: Weight: 80.8 kg       Palliative Assessment/Data: PPS 30-40%    Flowsheet Rows     Most Recent Value  Intake Tab  Referral Department   Critical care  Unit at Time of Referral  ICU  Palliative Care Primary Diagnosis  Cardiac  Palliative Care Type  New Palliative care  Reason for referral  Clarify Goals of Care  Date first seen by Palliative Care  12/10/19  Clinical Assessment  Palliative Performance Scale Score  40%  Psychosocial & Spiritual Assessment  Palliative Care Outcomes  Patient/Family meeting held?  Yes  Who was at the meeting?  patient and wife  Palliative Care Outcomes  Clarified goals of care, Provided psychosocial or spiritual support, ACP counseling assistance      Patient Active Problem List   Diagnosis Date Noted   Shock ([place]) 12/10/2019   Palliative care by specialist    Acute respiratory failure ([place])    Goals of care, counseling/discussion    AKI (acute kidney injury) ([place]) 12/11/2019   Thrombocytopenia ([place]) 11/25/2019   Bronchiectasis ([place]) 11/25/2019   DJD (degenerative joint disease) 11/25/2019   Elevated IgE level 11/25/2019   Type 2 diabetes mellitus with peripheral vascular disease ([place]) 09/11/2019   Chronic diastolic heart failure ([place]) 09/04/2019   Pressure injury of skin 09/04/2019   Pacemaker    Aortic valve stenosis    Heart block AV third degree ([place])    Opioid dependence on maintenance agonist therapy, no symptoms ([place]) 07/01/2019   Elevated troponin 07/01/2019   LBBB (left bundle branch block) 07/01/2019   Current chronic use of systemic steroids 07/01/2019   Loculated pleural effusion 09/09/2016   S/P BKA (below knee amputation) unilateral, left ([place]) 08/09/2016   Status post amputation of toe of left foot ([place]) 96/29/5284   Systolic heart failure ([place]) 07/09/2015   Prolonged QT interval 08/14/2014   Gait disorder 08/14/2014   Seizure ([place]) 08/13/2014   Normocytic anemia 08/13/2014   Altered  mental status 06/18/2014   Closed dislocation of metatarsophalangeal (joint) 03/26/2013   DM (diabetes mellitus) type II controlled with renal  manifestation ([place]) 12/11/2012   Hyperlipidemia 12/11/2012   Coronary artery disease    Rheumatoid arthritis [facility])    Hypertension     Palliative Care Assessment & Plan   Patient Profile: 68 y.o. 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 Extubated 12/13, CRRT stopped 12/14. Being treated with antibiotics for pseudomonas and MRSA tracheobronchitis vs. Pneumonia. Shock respiratory failure/liver improved. Patient's kidney function worsening on 12/15. Nephrology following. Plan for Lasix 80mg  IV today and concern that he may need intermittent hemodialysis. Patient with severe aortic stenosis. Cardiology following. TAVR team evaluated and given patient's multiple co-morbidities, he is not felt to be a candidate for TAVR. Recommending palliative medicine consultation for goals of care.   Assessment: Septic shock Pseudomonas and MRSA tracheobronchitis vs pneumonia Severe aortic stenosis (not a candidate for TAVR) AKI (likely multifactorial from ATN and cardiorenal) Severe protein calorie malnutrition Dysphagia Shock liver RA Anemia of chronic disease Thrombocytopenia DM with hyperglycemia Hx of CAD, CABG Hx of left BKA  Recommendations/Plan:  Full Code/Full Scope-per wife  Wife reports patient is showing some signs of improvement/tolerance with CRRT despite awareness and education regarding hypotension and concerns for ability to tolerate long-term HD. She wishes to focus on him improving again confirming goal for all available medical treatment and watchful waiting.   As previously noted by my colleague Jinny Blossom, NP: Patient with multiple chronic conditions for many years and has also been critically ill multiple times in the past. Farmer has continued to  survive and surprise providers. This is why he and wife continue to desire FULL code/FULL scope treatment.   PMT will continue to support and follow. Ongoing discussions warranted.   Goals of Care and Additional Recommendations:  Limitations on Scope of Treatment: Full Scope Treatment  Code Status: FULL    Code Status Orders  (From admission, onward)         Start     Ordered   12/06/19 0311  Full code  Continuous     12/06/19 0310        Code Status History    Date Active Date Inactive Code Status Order ID Comments User Context   10/27/2019 1812 10/28/2019 2044 Full Code [id number]  Rudean Curt, MD Inpatient   09/11/2019 2350 09/15/2019 2027 Full Code [id number]  Rise Patience, MD ED   08/29/2019 1617 09/04/2019 2130 Full Code [id number]  Abigail Butts., PA-C ED   07/01/2019 1943 07/03/2019 1917 Full Code [id number]  Etta Quill, DO ED   10/15/2016 0744 10/23/2016 0312 Full Code [id number]  Oswald Hillock, MD Inpatient   09/02/2016 1918 09/05/2016 1503 Full Code [id number]  Vianne Bulls, MD ED   08/26/2016 1210 08/30/2016 1853 Full Code [id number]  Orvan Falconer, MD Inpatient   08/13/2014 0336 08/14/2014 1744 Full Code [id number]  Truett Mainland, DO Inpatient   12/11/2012 0312 12/11/2012 2058 Full Code [id number]  Karlyn Agee, MD Inpatient   Advance Care Planning Activity      Prognosis:  Poor Prognosis   Discharge Planning:  To Be Determined  Care plan was discussed with bedside RN, patient, and his wife.   Thank you for allowing the Palliative  Medicine Team to assist in the care of this patient.  Total Time: 20 min.   Greater than 50%  of this time was spent counseling and coordinating care related to the above assessment and plan.  Alda Lea, AGPCNP-BC Palliative Medicine Team  Phone: [phone]  Please contact Palliative Medicine Team phone at [phone] for questions and concerns.

## 2019-12-15 ENCOUNTER — Inpatient Hospital Stay (HOSPITAL_COMMUNITY): Payer: Medicare HMO

## 2019-12-15 LAB — POCT I-STAT 7, (LYTES, BLD GAS, ICA,H+H)
Acid-base deficit: 1 mmol/L (ref 0.0–2.0)
Bicarbonate: 22.6 mmol/L (ref 20.0–28.0)
Calcium, Ion: 1.11 mmol/L — ABNORMAL LOW (ref 1.15–1.40)
HCT: 19 % — ABNORMAL LOW (ref 39.0–52.0)
Hemoglobin: 6.5 g/dL — CL (ref 13.0–17.0)
O2 Saturation: 100 %
Patient temperature: 97.1
Potassium: 5.9 mmol/L — ABNORMAL HIGH (ref 3.5–5.1)
Sodium: 131 mmol/L — ABNORMAL LOW (ref 135–145)
TCO2: 24 mmol/L (ref 22–32)
pCO2 arterial: 29.9 mmHg — ABNORMAL LOW (ref 32.0–48.0)
pH, Arterial: 7.484 — ABNORMAL HIGH (ref 7.350–7.450)
pO2, Arterial: 513 mmHg — ABNORMAL HIGH (ref 83.0–108.0)

## 2019-12-15 LAB — CBC
HCT: 24 % — ABNORMAL LOW (ref 39.0–52.0)
HCT: 19.9 % — ABNORMAL LOW (ref 39.0–52.0)
HCT: 20.8 % — ABNORMAL LOW (ref 39.0–52.0)
Hemoglobin: 7.8 g/dL — ABNORMAL LOW (ref 13.0–17.0)
Hemoglobin: 5.9 g/dL — CL (ref 13.0–17.0)
Hemoglobin: 6.3 g/dL — CL (ref 13.0–17.0)
MCH: 31.1 pg (ref 26.0–34.0)
MCH: 31.2 pg (ref 26.0–34.0)
MCH: 30.6 pg (ref 26.0–34.0)
MCHC: 32.5 g/dL (ref 30.0–36.0)
MCHC: 29.6 g/dL — ABNORMAL LOW (ref 30.0–36.0)
MCHC: 30.3 g/dL (ref 30.0–36.0)
MCV: 95.6 fL (ref 80.0–100.0)
MCV: 105.3 fL — ABNORMAL HIGH (ref 80.0–100.0)
MCV: 101 fL — ABNORMAL HIGH (ref 80.0–100.0)
Platelets: 77 10*3/uL — ABNORMAL LOW (ref 150–400)
Platelets: 62 10*3/uL — ABNORMAL LOW (ref 150–400)
Platelets: 71 10*3/uL — ABNORMAL LOW (ref 150–400)
RBC: 2.51 MIL/uL — ABNORMAL LOW (ref 4.22–5.81)
RBC: 1.89 MIL/uL — ABNORMAL LOW (ref 4.22–5.81)
RBC: 2.06 MIL/uL — ABNORMAL LOW (ref 4.22–5.81)
RDW: 23.4 % — ABNORMAL HIGH (ref 11.5–15.5)
RDW: 24.4 % — ABNORMAL HIGH (ref 11.5–15.5)
RDW: 24.6 % — ABNORMAL HIGH (ref 11.5–15.5)
WBC: 19.8 10*3/uL — ABNORMAL HIGH (ref 4.0–10.5)
WBC: 23.4 10*3/uL — ABNORMAL HIGH (ref 4.0–10.5)
WBC: 23 10*3/uL — ABNORMAL HIGH (ref 4.0–10.5)
nRBC: 2.8 % — ABNORMAL HIGH (ref 0.0–0.2)
nRBC: 7.1 % — ABNORMAL HIGH (ref 0.0–0.2)
nRBC: 5.1 % — ABNORMAL HIGH (ref 0.0–0.2)

## 2019-12-15 LAB — COMPREHENSIVE METABOLIC PANEL
ALT: 41 U/L (ref 0–44)
AST: 42 U/L — ABNORMAL HIGH (ref 15–41)
Albumin: 1.5 g/dL — ABNORMAL LOW (ref 3.5–5.0)
Alkaline Phosphatase: 75 U/L (ref 38–126)
Anion gap: 11 (ref 5–15)
BUN: 34 mg/dL — ABNORMAL HIGH (ref 8–23)
CO2: 20 mmol/L — ABNORMAL LOW (ref 22–32)
Calcium: 8.3 mg/dL — ABNORMAL LOW (ref 8.9–10.3)
Chloride: 100 mmol/L (ref 98–111)
Creatinine, Ser: 1.03 mg/dL (ref 0.61–1.24)
GFR calc Af Amer: >60 mL/min (ref >60)
GFR calc non Af Amer: >60 mL/min (ref >60)
Glucose, Bld: 392 mg/dL — ABNORMAL HIGH (ref 70–99)
Potassium: 6.1 mmol/L — ABNORMAL HIGH (ref 3.5–5.1)
Sodium: 131 mmol/L — ABNORMAL LOW (ref 135–145)
Total Bilirubin: 2.3 mg/dL — ABNORMAL HIGH (ref 0.3–1.2)
Total Protein: 3.5 g/dL — ABNORMAL LOW (ref 6.5–8.1)

## 2019-12-15 LAB — POCT ACTIVATED CLOTTING TIME
Activated Clotting Time: 114 seconds
Activated Clotting Time: 164 seconds
Activated Clotting Time: 164 seconds
Activated Clotting Time: 169 seconds
Activated Clotting Time: 175 seconds
Activated Clotting Time: 180 seconds
Activated Clotting Time: 186 seconds
Activated Clotting Time: 197 seconds
Activated Clotting Time: 197 seconds
Activated Clotting Time: 202 seconds
Activated Clotting Time: 202 seconds
Activated Clotting Time: 208 seconds
Activated Clotting Time: 213 seconds
Activated Clotting Time: 219 seconds
Activated Clotting Time: 219 seconds

## 2019-12-15 LAB — PREPARE RBC (CROSSMATCH)

## 2019-12-15 LAB — GLUCOSE, CAPILLARY
Glucose-Capillary: 136 mg/dL — ABNORMAL HIGH (ref 70–99)
Glucose-Capillary: 112 mg/dL — ABNORMAL HIGH (ref 70–99)
Glucose-Capillary: 128 mg/dL — ABNORMAL HIGH (ref 70–99)
Glucose-Capillary: 253 mg/dL — ABNORMAL HIGH (ref 70–99)
Glucose-Capillary: 345 mg/dL — ABNORMAL HIGH (ref 70–99)

## 2019-12-15 LAB — RENAL FUNCTION PANEL
Albumin: 2 g/dL — ABNORMAL LOW (ref 3.5–5.0)
Albumin: 1.6 g/dL — ABNORMAL LOW (ref 3.5–5.0)
Anion gap: 7 (ref 5–15)
Anion gap: 9 (ref 5–15)
BUN: 32 mg/dL — ABNORMAL HIGH (ref 8–23)
BUN: 36 mg/dL — ABNORMAL HIGH (ref 8–23)
CO2: 27 mmol/L (ref 22–32)
CO2: 21 mmol/L — ABNORMAL LOW (ref 22–32)
Calcium: 7.3 mg/dL — ABNORMAL LOW (ref 8.9–10.3)
Calcium: 7.3 mg/dL — ABNORMAL LOW (ref 8.9–10.3)
Chloride: 101 mmol/L (ref 98–111)
Chloride: 103 mmol/L (ref 98–111)
Creatinine, Ser: 0.93 mg/dL (ref 0.61–1.24)
Creatinine, Ser: 0.91 mg/dL (ref 0.61–1.24)
GFR calc Af Amer: >60 mL/min (ref >60)
GFR calc Af Amer: >60 mL/min (ref >60)
GFR calc non Af Amer: >60 mL/min (ref >60)
GFR calc non Af Amer: >60 mL/min (ref >60)
Glucose, Bld: 156 mg/dL — ABNORMAL HIGH (ref 70–99)
Glucose, Bld: 257 mg/dL — ABNORMAL HIGH (ref 70–99)
Phosphorus: 1.9 mg/dL — ABNORMAL LOW (ref 2.5–4.6)
Phosphorus: 3.6 mg/dL (ref 2.5–4.6)
Potassium: 4.7 mmol/L (ref 3.5–5.1)
Potassium: 6.3 mmol/L (ref 3.5–5.1)
Sodium: 135 mmol/L (ref 135–145)
Sodium: 133 mmol/L — ABNORMAL LOW (ref 135–145)

## 2019-12-15 LAB — TYPE AND SCREEN
ABO/RH(D): O POS
Antibody Screen: NEGATIVE
Unit division: 0
Unit division: 0

## 2019-12-15 LAB — APTT
aPTT: >200 seconds (ref 24–36)
aPTT: >200 seconds (ref 24–36)

## 2019-12-15 LAB — TROPONIN I (HIGH SENSITIVITY): Troponin I (High Sensitivity): 205 ng/L (ref <18)

## 2019-12-15 LAB — BPAM RBC
Blood Product Expiration Date: 202101152359
Blood Product Expiration Date: 202101162359
ISSUE DATE / TIME: 202012181057
ISSUE DATE / TIME: 202012182152
Unit Type and Rh: 5100
Unit Type and Rh: 5100

## 2019-12-15 LAB — HEPATIC FUNCTION PANEL
ALT: 44 U/L (ref 0–44)
AST: 32 U/L (ref 15–41)
Albumin: 1.9 g/dL — ABNORMAL LOW (ref 3.5–5.0)
Alkaline Phosphatase: 82 U/L (ref 38–126)
Bilirubin, Direct: 1.5 mg/dL — ABNORMAL HIGH (ref 0.0–0.2)
Indirect Bilirubin: 1.1 mg/dL — ABNORMAL HIGH (ref 0.3–0.9)
Total Bilirubin: 2.6 mg/dL — ABNORMAL HIGH (ref 0.3–1.2)
Total Protein: 4.3 g/dL — ABNORMAL LOW (ref 6.5–8.1)

## 2019-12-15 LAB — MAGNESIUM
Magnesium: 2.6 mg/dL — ABNORMAL HIGH (ref 1.7–2.4)
Magnesium: 2.5 mg/dL — ABNORMAL HIGH (ref 1.7–2.4)

## 2019-12-15 LAB — HEPARIN LEVEL (UNFRACTIONATED)
Heparin Unfractionated: 0.68 IU/mL (ref 0.30–0.70)
Heparin Unfractionated: 0.86 IU/mL — ABNORMAL HIGH (ref 0.30–0.70)

## 2019-12-15 LAB — LACTIC ACID, PLASMA: Lactic Acid, Venous: 10.5 mmol/L (ref 0.5–1.9)

## 2019-12-15 LAB — PHOSPHORUS: Phosphorus: 4.8 mg/dL — ABNORMAL HIGH (ref 2.5–4.6)

## 2019-12-15 MED ORDER — HYDROCORTISONE NA SUCCINATE PF 100 MG IJ SOLR
50.0000 mg | Freq: Four times a day (QID) | INTRAMUSCULAR | Status: DC
  Administered 2019-12-15 – 2019-12-17 (×7): 50 mg via INTRAVENOUS
  Filled 2019-12-15 (×7): qty 2

## 2019-12-15 MED ORDER — ACETAMINOPHEN 650 MG RE SUPP: 650.0000 mg | Freq: Four times a day (QID) | RECTAL | Status: DC | PRN

## 2019-12-15 MED ORDER — PRISMASOL BGK 0/2.5 32-2.5 MEQ/L IV SOLN
INTRAVENOUS | Status: DC
  Filled 2019-12-15 (×6): qty 5000

## 2019-12-15 MED ORDER — VASOPRESSIN 20 UNIT/ML IV SOLN
0.0300 [IU]/min | INTRAVENOUS | Status: DC
  Administered 2019-12-15 – 2019-12-17 (×2): 0.03 [IU]/min via INTRAVENOUS
  Filled 2019-12-15 (×2): qty 2

## 2019-12-15 MED ORDER — EPINEPHRINE HCL 5 MG/250ML IV SOLN IN NS
0.5000 ug/min | INTRAVENOUS | Status: DC
  Administered 2019-12-15: 17 ug/min via INTRAVENOUS
  Administered 2019-12-17: 09:00:00 16 ug/min via INTRAVENOUS
  Administered 2019-12-17: 14 ug/min via INTRAVENOUS
  Administered 2019-12-17: 6 ug/min via INTRAVENOUS
  Filled 2019-12-15 (×4): qty 250

## 2019-12-15 MED ORDER — ACETAMINOPHEN 160 MG/5ML PO SOLN
650.0000 mg | Freq: Four times a day (QID) | ORAL | Status: DC | PRN
  Administered 2019-12-15: 650 mg via ORAL
  Filled 2019-12-15: qty 20.3

## 2019-12-15 MED ORDER — POTASSIUM PHOSPHATES 15 MMOLE/5ML IV SOLN
30.0000 mmol | Freq: Once | INTRAVENOUS | Status: AC
  Administered 2019-12-15: 30 mmol via INTRAVENOUS
  Filled 2019-12-15: qty 10

## 2019-12-15 MED ORDER — PRISMASOL BGK 0/2.5 32-2.5 MEQ/L IV SOLN
INTRAVENOUS | Status: DC
  Filled 2019-12-15 (×19): qty 5000

## 2019-12-15 MED ORDER — NOREPINEPHRINE 16 MG/250ML-% IV SOLN
0.0000 ug/min | INTRAVENOUS | Status: DC
  Administered 2019-12-15: 35 ug/min via INTRAVENOUS
  Administered 2019-12-16: 50 ug/min via INTRAVENOUS
  Administered 2019-12-16: 42 ug/min via INTRAVENOUS
  Administered 2019-12-16: 50 ug/min via INTRAVENOUS
  Administered 2019-12-17: 70 ug/min via INTRAVENOUS
  Administered 2019-12-17: 01:00:00 58 ug/min via INTRAVENOUS
  Administered 2019-12-17 (×2): 70 ug/min via INTRAVENOUS
  Filled 2019-12-15 (×9): qty 250

## 2019-12-15 MED ORDER — SODIUM CHLORIDE 0.9% IV SOLUTION: Freq: Once | INTRAVENOUS | Status: AC

## 2019-12-15 MED ORDER — FENTANYL 2500MCG IN NS 250ML (10MCG/ML) PREMIX INFUSION
0.0000 ug/h | INTRAVENOUS | Status: DC
  Administered 2019-12-15: 50 ug/h via INTRAVENOUS
  Administered 2019-12-15 – 2019-12-16 (×2): 75 ug/h via INTRAVENOUS
  Filled 2019-12-15 (×2): qty 250

## 2019-12-15 MED ORDER — PRISMASOL BGK 0/2.5 32-2.5 MEQ/L IV SOLN
INTRAVENOUS | Status: DC
  Filled 2019-12-15 (×5): qty 5000

## 2019-12-15 NOTE — Progress Notes (Signed)
eLink Physician-Brief Progress Note Patient Name: EDILBERTO ROOSEVELT DOB: January 05, 1951 MRN: [id number]   Date of Service  12/15/2019  HPI/Events of Note  Notified that patient now on 3 pressors. No central line.  eICU Interventions  Discussed with wife who is a bedside. Explained to her that prognosis is very poor given multiorgan failure and if he was to survive this chances of meaningful recovery is small. She would like to have an MD speak with their children as well who are all out in the waiting area. Bedside RN to see if hospitalist available to talk to family.     Intervention Category Major Interventions: Hypotension - evaluation and management Intermediate Interventions: Communication with other healthcare providers and/or family  Judd Lien 12/15/2019, 10:15 PM

## 2019-12-15 NOTE — Progress Notes (Signed)
Called to assess pt due to congestion. Upon auscultation, pt clear on left and rhonchi/crackles on right. I tried to arouse pt to cough, pt would not and attempted to suction back of throat to see if pt would cough and he did not. RN made aware.

## 2019-12-15 NOTE — Progress Notes (Signed)
Critical APTT >200. Labs drawn from purple lumen on HD catheter. Order re entered for phlebotomy to come and stick patient to obtain APTT.   RN will continue to monitor pt closely.

## 2019-12-15 NOTE — Progress Notes (Signed)
eLink Physician-Brief Progress Note Patient Name: DREYDON CARDENAS DOB: 03-29-1951 MRN: [id number]   Date of Service  12/15/2019  HPI/Events of Note  [place] at [facility] - Request for portable CXR.   eICU Interventions  Will order portable CXR.      Intervention Category Major Interventions: Other:  Draken Farrior Cornelia [place] 12/15/2019, 3:24 AM

## 2019-12-15 NOTE — Progress Notes (Addendum)
[facility] NEPHROLOGY PROGRESS NOTE  Assessment/ Plan: Pt is a 68 y.o. yo male with history of RA, bronchiectasis, severe AS admitted from [facility] with shock, multiorgan failure and AKI.  #AKI, oliguric: Suspect ATN from sepsis concomitant with acute cardiorenal syndrome/severe AS.  Refractory to IV diuretics. CRRT DC'd on 12/14 and tried intermittent HD on 12/16.  Patient did not tolerate IHD.  Resumed CRRT on 12/18.  Tolerating CRRT well however UF is limited because of hypotension.  Total UF 2119 cc.  Plan to continue CRRT today to optimize volume. I am not sure if patient can tolerate outpatient HD given his severe AS and deconditioning.  I recommend palliative care consult.  #Acute shock likely cardiogenic and sepsis: Currently off pressors, blood pressure is soft.  On antibiotics per primary team.    #Acute respiratory failure, history of bronchiectasis: Extubated.  #Severe AS: Follow-up cardiologist plan.  Not a candidate for TAVR.  Palliative care following  #Hyponatremia, hypervolemic: Now on CRRT.  #Anemia of critical illness: Receiving blood transfusion.  Iron sat 31%.   #Hypophosphatemia: Replete K-Phos.  Discussed with ICU nurse.  Subjective: No new event.  Tolerating CRRT well so far.  No problem with filter.  On heparin in the circuit.  Objective Vital signs in last 24 hours: Vitals:   12/15/19 0630 12/15/19 0700 12/15/19 0739 12/15/19 0800  BP: 113/62 (!) 117/44  110/74  Pulse: 85 86  82  Resp: (!) 22 17  (!) 23  Temp:   (!) 96.5 F (35.8 C)   TempSrc:   Axillary   SpO2: 100% 100%  100%  Weight:      Height:       Weight change: -2 kg  Intake/Output Summary (Last 24 hours) at 12/15/2019 0849 Last data filed at 12/15/2019 0800 Gross per 24 hour  Intake 940.49 ml  Output 2136 ml  Net -1195.51 ml       Labs: Basic Metabolic Panel: Recent Labs  Lab 12/14/19 0516 12/14/19 1512 12/15/19 0323  NA 134* 132* 135  K 4.7 4.6 4.7  CL 101  100 101  CO2 25 27 27   GLUCOSE 110* 271* 156*  BUN 53* 39* 32*  CREATININE 1.57* 1.17 0.93  CALCIUM 7.7* 7.5* 7.3*  PHOS 3.2 2.6 1.9*   Liver Function Tests: Recent Labs  Lab 12/11/19 0221 12/12/19 0338 12/14/19 1512 12/15/19 0322 12/15/19 0323  AST 61* 42*  --  32  --   ALT 129* 93*  --  44  --   ALKPHOS 116 107  --  82  --   BILITOT 2.3* 2.9*  --  2.6*  --   PROT 5.2* 5.9*  --  4.3*  --   ALBUMIN 2.3* 3.2* 2.1* 1.9* 2.0*   No results for input(s): LIPASE, AMYLASE in the last 168 hours. No results for input(s): AMMONIA in the last 168 hours. CBC: Recent Labs  Lab 12/12/19 0338 12/13/19 0455 12/13/19 1533 12/14/19 0110 12/14/19 0516 12/15/19 0322  WBC 14.5* 19.2* 16.9*  --  20.3* 19.8*  HGB 7.8* 6.1* 6.7* 8.9* 8.8* 7.8*  HCT 24.8* 19.8* 21.1* 27.0* 27.2* 24.0*  MCV 96.1 102.1* 96.8  --  95.1 95.6  PLT 62* 79* 83*  --  77* 77*   Cardiac Enzymes: No results for input(s): CKTOTAL, CKMB, CKMBINDEX, TROPONINI in the last 168 hours. CBG: Recent Labs  Lab 12/14/19 1526 12/14/19 1954 12/14/19 2331 12/15/19 0352 12/15/19 0737  GLUCAP 192* 218* 176* 136* 112*  Iron Studies:  Recent Labs    12/13/19 0455  IRON 39*  TIBC 125*  FERRITIN 229   Studies/Results: DG CHEST PORT 1 VIEW  Result Date: 12/15/2019 CLINICAL DATA:  Rhonchi at the right lung base. EXAM: PORTABLE CHEST 1 VIEW COMPARISON:  December 09, 2019 FINDINGS: The heart size remains mildly enlarged. The patient is status post prior median sternotomy. There is a right-sided central venous catheter in place with no evidence for pneumothorax. The tip projects over the SVC. There are persistent low lung volumes. There is a right basilar airspace opacity favored to represent atelectasis with an infiltrate not excluded. There are small bilateral pleural effusions, right greater than left. IMPRESSION: 1. Lines and tubes as above. 2. No significant interval change. Persistent bibasilar airspace opacities, right  worse than left with small bilateral pleural effusions. Electronically Signed   By: Constance Holster M.D.   On: 12/15/2019 03:42    Medications: Infusions: .  prismasol BGK 4/2.5 500 mL/hr at 12/14/19 2355  .  prismasol BGK 4/2.5 300 mL/hr at 12/14/19 2145  . sodium chloride Stopped (12/13/19 1101)  . ceFEPime (MAXIPIME) IV 2 g (12/14/19 2144)  . heparin 10,000 units/ 20 mL infusion syringe 1,050 Units/hr (12/15/19 0832)  . heparin    . lactated ringers Stopped (12/10/19 0738)  . norepinephrine (LEVOPHED) Adult infusion 4 mcg/min (12/15/19 0800)  . prismasol BGK 4/2.5 1,500 mL/hr at 12/15/19 0028  . vancomycin 1,000 mg (12/14/19 1415)    Scheduled Medications: . chlorhexidine  15 mL Mouth Rinse BID  . Chlorhexidine Gluconate Cloth  6 each Topical Q2200  . feeding supplement (ENSURE ENLIVE)  237 mL Oral BID BM  . heparin injection (subcutaneous)  5,000 Units Subcutaneous Q8H  . hydrocortisone sod succinate (SOLU-CORTEF) inj  50 mg Intravenous Daily  . hydroxychloroquine  200 mg Oral Daily  . insulin aspart  0-15 Units Subcutaneous Q4H  . mouth rinse  15 mL Mouth Rinse q12n4p  . methadone  10 mg Oral Q6H  . midodrine  10 mg Oral TID WC  . multivitamin with minerals  1 tablet Oral Daily  . sodium chloride flush  10-40 mL Intracatheter Q12H    have reviewed scheduled and prn medications.  Physical Exam: General: Alert awake, not in distress Heart:RRR, s1s2 nl, no rubs Lungs: Clear anteriorly, no wheezing Abdomen:soft, Non-tender, non-distended Extremities: LE edema + Dialysis Access: Right IJ temporary catheter placed on 12/12.  Belynda Pagaduan Prasad Coralie Stanke 12/15/2019,8:49 AM  LOS: 9 days  Pager: [phone]

## 2019-12-15 NOTE — Progress Notes (Addendum)
Dr Carolin Sicks (Nephrology) aware of APTT lab of >200. MD understands that result influenced by CRRT Heparin and utilizing axillary port (purple port) on HD cath for blood draw. ACT being drawn q4 hours for CRRT, MD aware phebotomist unable to draw blood because Pt is very edematous

## 2019-12-15 NOTE — Progress Notes (Signed)
eLink Physician-Brief Progress Note Patient Name: [name] DOB: 1951-04-15 MRN: [id number]   Date of Service  12/15/2019  HPI/Events of Note  Called to bedside for increasing pressor requirement and shallow breathing. On camera assessment patient was agonal and was pulseless on bedside exam. CPR ensued. K 6.3  eICU Interventions  Epi x 2, calcium chloride 1 g and bicarb 1 amp given with ROSC. Now intubated. CCM team now at bedside     Intervention Category Major Interventions: Code management / supervision  Judd Lien 12/15/2019, 7:48 PM

## 2019-12-15 NOTE — Progress Notes (Signed)
Dr Clover Mealy Paged via [place] regarding potassium of 6.7

## 2019-12-15 NOTE — Progress Notes (Addendum)
Alerted that patient had cardiac arrest - PEA.  Upon review of EMR, he has been having increasing Levophed requirements since around 2-3PM. CRRT fluid removal was decreased.   BP 100/82   Pulse 95   Temp 97.7 F (36.5 C) (Oral)   Resp 19   Ht 5\' 8"  (1.727 m)   Wt 78.8 kg   SpO2 100%   BMI 26.41 kg/m   General Apperance: Sedated HEENT: Normocephalic, atraumatic, anicteric sclera Neck: Supple, trachea midline Lungs: Few rhonchi Heart: Regular rate and rhythm Abdomen: Soft, nontender, nondistended, no rebound/guarding Extremities: L BKA Skin: No rashes or lesions Neurologic: Opens eyes spontaneously and moves all extremities, sedated  67 year old man with RA, bronchiectasis, severe AS admitted [facility] 12/11 in septic shock and multiorgan failure.  Required intubation and CRRT.  Rapid improvement, extubated 12/13 and CRRT stopped 12/14.  Multiorgan failure in the setting of septic shock 2/2 pseudomonas and MRSA pneumonia, cardiac arrest 12/15/2019 - back on levophed with CRRT. Acute circulatory shock +   PLAN - Levo gtt, vaso gtt and epi gtt for MAP goal > 65. Will wean epi gtt first as able. - No new fever. Continue Vanc Cefepime  - Check lactic acid, blood gas.  - Stress dose steroids - hydrocortisone 50mg  q 6 hours - Obtain TTE - Check trop - Continue vent support. fent gtt for RASS goal of 0. SAT/SBT in AM  AKI (likely a mix of cardiorenal syndrome and ATN)  PLAN - nephrology following - Continue CRRT - Recheck labs, was having hyperkalemia earlier  Bronchiectasis w/ C tropicalis infection vs colonization (f/b Dr Carlis Abbott) - Continue CPT  RA: on plaquenil and chronic pred -  Back on stress dose steroids - Continue plaquenil and methadone   Anemia of chronic disease  Thrombocytopenia   - PRBC for hgb </= 6.9gm%               - exceptions are                         -  if ACS susepcted/confirmed then transfuse for hgb </= 8.0gm%,  or    DM w/  hyperglycemia  - SSI resistant  Goals of care: - Discussed what happened tonight with wife who was at bedside. She was distraught and said to do everything we can. Will need ongoing goals of care discussion. - ADDENDUM: Discussed current status of patient with his wife and children. Expressed concern that he is on 3 pressors and prognosis is poor. They have opted to change him to Do Not Resuscitate (DNR) and continue supportive care for now with the understand that he is at high risk for passing away tonight   The patient is critically ill with multiple organ systems failure and requires high complexity decision making for assessment and support, frequent evaluation and titration of therapies, application of advanced monitoring technologies and extensive interpretation of multiple databases.   Critical Care Time devoted to patient care services described in this note is 30 Minutes. This time reflects time of care of this signee. This critical care time does not reflect procedure time, or teaching time or supervisory time of PA/NP/Med student/Med Resident etc but could involve care discussion time.  Jacques Earthly, M.D. [facility] Pulmonary/Critical Care Medicine After hours pager: [phone].

## 2019-12-15 NOTE — Progress Notes (Signed)
NAME:  [name], MRN:  [id number], DOB:  [birth year], LOS: 9 ADMISSION DATE:  12/24/2019, CONSULTATION DATE:  12/07/2019 REFERRING MD:  TRH, CHIEF COMPLAINT: Altered mental status  Brief History   68 yo male with extensive PMH as below admitted from [facility]/11 in septic shock and multiorgan failure.  Required intubation and CRRT.  Rapid improvement, extubated 12/13 and CRRT stopped 12/14.   Significant Hospital Events   12/10 > Admitted to [facility] 12/11 > Transferred to [facility] 12/12 > Initiated CVVH, off pressors 12/13 > extubated  12/14 > stopped CRRT 12/16 > to start iHD,did not tolerate from a hemodynamics standpoint.  12/18 > back on CRRT, Levophed Restarted on Levophed this AM. Awaiting blood transfusion for hgb 6.1. No complaints. 12/19  - pall care cnsult - full code + /sp 1 unit prbc overnight. Back on CRRT . Remains extubaed. Wife giving him water. Denied any questions.   Consults:  Nephrology PCCM Cardiology Palliative  Procedures:  ETT 12/11 >> 12/13 Right femoral CVL 12/11 >> Right IJ HD catheter 12/12 >>  Significant Diagnostic Tests:  12/10 [facility] >>chronic small vessel disease without acute intracranial abnormality 12/10 CT a/p >>  Mild gastric distension with air-fluid level, can be seen with gastroenteritis or gastroparesis. No gastric wall thickening or evidence of gastric outlet obstruction. Small volume of ascitesin the abdomen and pelvis, new from prior CT. Mild body wall edema. Large stool burden with colonic tortuosity, suggesting constipation. No bowel obstruction. Bilateral pleural effusions. Irregular streaky consolidative airspace opacities in both lower lobes, increased from CT last month. Pneumonia or aspiration considered. Severe aortic and branch atherosclerosis. Aortic Atherosclerosis   Micro Data:  12/10 SARS2 >> neg 12/11 MRSA PCR >> neg 12/11 RVP >> neg 12/10 UC >> neg 12/10 BCx 2 >> neg 12/12 trach asp >> MRSA and pseudomonas AFB  12/12 neg Fungal culture 12/12 >>>  Antimicrobials:  Ceftriaxone 12/10 >>12/11 Doxycycline 12/10  >> 12/11 Meropenem 12/12 >>12/14 Anidulafungin 1 dose 12/11 Cefepime 12/15  Vancomycin 12/11   Interim history/subjective:     12/20 - on crrt. On levophed 27mcg. Not on vent. On Retail banker. Wife reports she gave him breakfast and he is "one tough cookie".   Objective   Blood pressure (!) 95/42, pulse 79, temperature (!) 96.5 F (35.8 C), temperature source Axillary, resp. rate 14, height 5\' 8"  (1.727 m), weight 78.8 kg, SpO2 100 %.        Intake/Output Summary (Last 24 hours) at 12/15/2019 1224 Last data filed at 12/15/2019 1207 Gross per 24 hour  Intake 1286.98 ml  Output 2271 ml  Net -984.02 ml   Filed Weights   12/13/19 0500 12/14/19 0500 12/15/19 0500  Weight: 83 kg 80.8 kg 78.8 kg    .General Appearance:  Looks frail, deconditioned Head:  Normocephalic, without obvious abnormality, atraumatic Eyes:  PERRL - yes, conjunctiva/corneas - muddy     Ears:  Normal external ear canals, both ears Nose:  G tube - no Throat:  ETT TUBE - no , OG tube - no Neck:  Supple,  No enlargement/tenderness/nodules Lungs: Clear to auscultation bilaterally,  Heart:  S1 and S2 normal, no murmur, CVP - x.  Pressors - levophed Abdomen:  Soft, no masses, no organomegaly Genitalia / Rectal:  Not done Extremities:  Extremities- Left BKA Skin:  ntact in exposed areas . Sacral area - not examined Neurologic:  Sedation - none -> RASS - -1 . Moves all 4s - yes. CAM-ICU -  moans . Orientation - moans but followed command for wife        Resolved Hospital Problem list   Cardiogenic shock Septic shock Septic coagulopathy Acute hypoxic respiratory failure   Assessment & Plan:   Multiorgan failure in the setting of septic shock 2/2 pseudomonas and MRSA tracheobronchitis vs pneumonia  12/15/2019 - back on levophed with CRRT. Acute circulatory shock +   PLAN - MAP goal > 65 - Tele  monitoring - Continue vanc Cefepime x 10 days (through 12/21)  severe AS: (not a TAVR candidate pre cardiology see cards consult note)  - Supportive care  AKI (likely a mix of cardiorenal syndrome and ATN)CRRT DC'd on 12/14 and tried intermittent HD on 12/16.  Patient did not tolerate IHD.  Resumed CRRT on 12/18. If not able to tolerate iHD, or be liberated altogether once infection/sepsis treated, will require palliative discussion.   12/15/2019 - CRRT +  PLAN - nephrology following,  - Continue midorine  Severe physical deconditioning  Severe protein calorie malnutrition  Dysphagia  - PMT involved - Diet  Bronchiectasis w/ C tropicalis infection vs colonization (f/b Dr Carlis Abbott) - Continue CPT  RA: on plaquenil and chronic pred - - Wean steroids (work back to 5mg  pred daily over next few days) - Continue plaquenil and methadone   Anemia of chronic disease  Thrombocytopenia   12/15/2019 -no active bleeeding. Plat 77K  plan - - PRBC for hgb </= 6.9gm%    - exceptions are   -  if ACS susepcted/confirmed then transfuse for hgb </= 8.0gm%,  or    -  active bleeding with hemodynamic instability, then transfuse regardless of hemoglobin value   At at all times try to transfuse 1 unit prbc as possible with exception of active hemorrhage     DM w/ hyperglycemia  - SSI resistant  Goals of care: - PMT following, Long term prognosis rather poor, but family and patient are wishing for aggressive scope of treatment. D/w wife today 12/18. She is aware he did not tolerate HD. She is hopeful that as he completes his course of ABX, the infectious component of shock and AKI can improve to the point he no longer needs HD or can tolerate it. She also understands if he does not improve despite finishing his course of ABX, he may simply not be a candidate for HD and may need to pursue a different goal to his care. She would like to get him home ultimately.   12/19 /2020 - pall care meeting -  full code   Best practice:  Diet: Dysphagia 3 Pain/Anxiety/Delirium protocol (if indicated): chronic methadone VAP protocol (if indicated): n/a DVT prophylaxis: SQH GI prophylaxis: Yes Glucose control: SSI Mobility: PT/ OT Code Status: Full Family Communication: updated 12/18 as above. At bedside 12/19 wife denied questions. Updated wife 12/15/2019 Disposition: ICU   ATTESTATION & SIGNATURE     Dr. Brand Males, M.D., [facility].C.P Pulmonary and Critical Care Medicine Staff Physician [place] [facility] Pager: [phone], If no answer or between  15:00h - 7:00h: call [phone]  12/15/2019 12:24 PM     LABS    PULMONARY Recent Labs  Lab 12/13/19 1415  HCO3 21.5  TCO2 23  O2SAT 54.0    CBC Recent Labs  Lab 12/13/19 1533 12/14/19 0110 12/14/19 0516 12/15/19 0322  HGB 6.7* 8.9* 8.8* 7.8*  HCT 21.1* 27.0* 27.2* 24.0*  WBC 16.9*  --  20.3* 19.8*  PLT 83*  --  77* 77*    COAGULATION Recent Labs  Lab 12/09/19 1027  INR 1.6*    CARDIAC  No results for input(s): TROPONINI in the last 168 hours. No results for input(s): PROBNP in the last 168 hours.   CHEMISTRY Recent Labs  Lab 12/11/19 0221 12/12/19 0338 12/13/19 0455 12/13/19 1415 12/13/19 1533 12/14/19 0516 12/14/19 1512 12/15/19 0322 12/15/19 0323  NA 128* 133* 130* 130* 130* 134* 132*  --  135  K 5.5* 3.4* 5.3* 5.1 4.9 4.7 4.6  --  4.7  CL 95* 95* 96*  --  98 101 100  --  101  CO2 24 27 20*  --  22 25 27   --  27  GLUCOSE 287* 129* 164*  --  301* 110* 271*  --  156*  BUN 68* 31* 90*  --  83* 53* 39*  --  32*  CREATININE 2.54* 1.14 2.62*  --  2.30* 1.57* 1.17  --  0.93  CALCIUM 8.0* 7.9* 8.0*  --  7.6* 7.7* 7.5*  --  7.3*  MG 3.0* 2.4 2.9*  --   --  2.8*  --  2.6*  --   PHOS 4.9* 1.8* 4.9*  --  5.1* 3.2 2.6  --  1.9*   Estimated Creatinine Clearance: 71.5 mL/min (by C-G formula based on SCr of 0.93 mg/dL).   LIVER Recent Labs  Lab  12/09/19 0451 12/09/19 1027 12/10/19 0318 12/11/19 0221 12/12/19 0338 12/13/19 1533 12/14/19 0516 12/14/19 1512 12/15/19 0322 12/15/19 0323  AST 103*  --  78* 61* 42*  --   --   --  32  --   ALT 208*  --  172* 129* 93*  --   --   --  44  --   ALKPHOS 121  --  121 116 107  --   --   --  82  --   BILITOT 2.7*  --  2.3* 2.3* 2.9*  --   --   --  2.6*  --   PROT 5.4*  --  5.6* 5.2* 5.9*  --   --   --  4.3*  --   ALBUMIN 2.4*  --  2.5* 2.3* 3.2* 2.1* 2.4* 2.1* 1.9* 2.0*  INR  --  1.6*  --   --   --   --   --   --   --   --      INFECTIOUS No results for input(s): LATICACIDVEN, PROCALCITON in the last 168 hours.   ENDOCRINE CBG (last 3)  Recent Labs    12/15/19 0352 12/15/19 0737 12/15/19 1108  GLUCAP 136* 112* 128*         IMAGING x48h  - image(s) personally visualized  -   highlighted in bold DG CHEST PORT 1 VIEW  Result Date: 12/15/2019 CLINICAL DATA:  Rhonchi at the right lung base. EXAM: PORTABLE CHEST 1 VIEW COMPARISON:  December 09, 2019 FINDINGS: The heart size remains mildly enlarged. The patient is status post prior median sternotomy. There is a right-sided central venous catheter in place with no evidence for pneumothorax. The tip projects over the SVC. There are persistent low lung volumes. There is a right basilar airspace opacity favored to represent atelectasis with an infiltrate not excluded. There are small bilateral pleural effusions, right greater than left. IMPRESSION: 1. Lines and tubes as above. 2. No significant interval change. Persistent bibasilar airspace opacities, right worse than left with small bilateral pleural effusions. Electronically  Signed   By: Constance Holster M.D.   On: 12/15/2019 03:42

## 2019-12-15 NOTE — Progress Notes (Signed)
   12/15/19 2023  Clinical Encounter Type  Visited With Patient and family together  Visit Type Initial;Code  Referral From Nurse  Consult/Referral To Chaplain  Spiritual Encounters  Spiritual Needs Emotional;Grief support;Prayer  Stress Factors  Family Stress Factors Health changes  This chaplain responded with the medical team to Pt. Code Blue.  The chaplain was pastorally present with the Pt. wife-Martha, bed side during the Code. The Pt. wife was visibly distraught by the possiblity of losing her husband of 92yrs. The chaplain witnessed  [name] relax and be comforted by the health care  team sharing they were doing everything they could for the Pt.  [name] accepted the chaplain's invitation for prayer. The chaplain introduced herself to the Pt. son and daughter and offered continued spiritual care for the family. The family is in the waiting room at this time.

## 2019-12-15 NOTE — Procedures (Signed)
Arterial Catheter Insertion Procedure Note HUIE GHUMAN [id number] [birth year]  Procedure: Insertion of Arterial Catheter  Indications: Blood pressure monitoring  Procedure Details Consent: Risks of procedure as well as the alternatives and risks of each were explained to the (patient/caregiver).  Consent for procedure obtained. Time Out: Verified patient identification, verified procedure, site/side was marked, verified correct patient position, special equipment/implants available, medications/allergies/relevent history reviewed, required imaging and test results available.  Performed  Maximum sterile technique was used including antiseptics, gloves, gown, hand hygiene, mask and sheet. Skin prep: Chlorhexidine; local anesthetic administered 20 gauge catheter was inserted into right radial artery using the Seldinger technique. ULTRASOUND GUIDANCE USED: YES Evaluation Blood flow good; BP tracing good. Complications: No apparent complications.   Otilio Carpen Florence Antonelli 12/15/2019

## 2019-12-16 ENCOUNTER — Inpatient Hospital Stay (HOSPITAL_COMMUNITY): Payer: Medicare HMO

## 2019-12-16 DIAGNOSIS — I351 Nonrheumatic aortic (valve) insufficiency: Secondary | ICD-10-CM

## 2019-12-16 DIAGNOSIS — R4182 Altered mental status, unspecified: Secondary | ICD-10-CM

## 2019-12-16 DIAGNOSIS — Z452 Encounter for adjustment and management of vascular access device: Secondary | ICD-10-CM

## 2019-12-16 DIAGNOSIS — I350 Nonrheumatic aortic (valve) stenosis: Secondary | ICD-10-CM

## 2019-12-16 DIAGNOSIS — Z978 Presence of other specified devices: Secondary | ICD-10-CM

## 2019-12-16 DIAGNOSIS — R0902 Hypoxemia: Secondary | ICD-10-CM

## 2019-12-16 LAB — CBC
HCT: 26.8 % — ABNORMAL LOW (ref 39.0–52.0)
Hemoglobin: 8.5 g/dL — ABNORMAL LOW (ref 13.0–17.0)
MCH: 30.1 pg (ref 26.0–34.0)
MCHC: 31.7 g/dL (ref 30.0–36.0)
MCV: 95 fL (ref 80.0–100.0)
Platelets: 62 10*3/uL — ABNORMAL LOW (ref 150–400)
RBC: 2.82 MIL/uL — ABNORMAL LOW (ref 4.22–5.81)
RDW: 22.5 % — ABNORMAL HIGH (ref 11.5–15.5)
WBC: 20 10*3/uL — ABNORMAL HIGH (ref 4.0–10.5)
nRBC: 15 % — ABNORMAL HIGH (ref 0.0–0.2)

## 2019-12-16 LAB — ECHOCARDIOGRAM COMPLETE
Height: 68 in
Weight: 2892.44 oz

## 2019-12-16 LAB — TYPE AND SCREEN
ABO/RH(D): O POS
Antibody Screen: NEGATIVE
Unit division: 0

## 2019-12-16 LAB — RENAL FUNCTION PANEL
Albumin: 1.8 g/dL — ABNORMAL LOW (ref 3.5–5.0)
Albumin: 1.8 g/dL — ABNORMAL LOW (ref 3.5–5.0)
Anion gap: 10 (ref 5–15)
Anion gap: 9 (ref 5–15)
BUN: 40 mg/dL — ABNORMAL HIGH (ref 8–23)
BUN: 30 mg/dL — ABNORMAL HIGH (ref 8–23)
CO2: 21 mmol/L — ABNORMAL LOW (ref 22–32)
CO2: 22 mmol/L (ref 22–32)
Calcium: 7.4 mg/dL — ABNORMAL LOW (ref 8.9–10.3)
Calcium: 7.5 mg/dL — ABNORMAL LOW (ref 8.9–10.3)
Chloride: 103 mmol/L (ref 98–111)
Chloride: 103 mmol/L (ref 98–111)
Creatinine, Ser: 1.16 mg/dL (ref 0.61–1.24)
Creatinine, Ser: 0.97 mg/dL (ref 0.61–1.24)
GFR calc Af Amer: >60 mL/min (ref >60)
GFR calc Af Amer: >60 mL/min (ref >60)
GFR calc non Af Amer: >60 mL/min (ref >60)
GFR calc non Af Amer: >60 mL/min (ref >60)
Glucose, Bld: 198 mg/dL — ABNORMAL HIGH (ref 70–99)
Glucose, Bld: 143 mg/dL — ABNORMAL HIGH (ref 70–99)
Phosphorus: 4.2 mg/dL (ref 2.5–4.6)
Phosphorus: 3 mg/dL (ref 2.5–4.6)
Potassium: 5.6 mmol/L — ABNORMAL HIGH (ref 3.5–5.1)
Potassium: 4.1 mmol/L (ref 3.5–5.1)
Sodium: 134 mmol/L — ABNORMAL LOW (ref 135–145)
Sodium: 134 mmol/L — ABNORMAL LOW (ref 135–145)

## 2019-12-16 LAB — GLUCOSE, CAPILLARY
Glucose-Capillary: 235 mg/dL — ABNORMAL HIGH (ref 70–99)
Glucose-Capillary: 189 mg/dL — ABNORMAL HIGH (ref 70–99)
Glucose-Capillary: 63 mg/dL — ABNORMAL LOW (ref 70–99)
Glucose-Capillary: 64 mg/dL — ABNORMAL LOW (ref 70–99)
Glucose-Capillary: 181 mg/dL — ABNORMAL HIGH (ref 70–99)
Glucose-Capillary: 84 mg/dL (ref 70–99)
Glucose-Capillary: 36 mg/dL — CL (ref 70–99)
Glucose-Capillary: 168 mg/dL — ABNORMAL HIGH (ref 70–99)
Glucose-Capillary: 127 mg/dL — ABNORMAL HIGH (ref 70–99)

## 2019-12-16 LAB — BPAM RBC
Blood Product Expiration Date: 202101162359
ISSUE DATE / TIME: 202012202359
Unit Type and Rh: 5100

## 2019-12-16 LAB — APTT: aPTT: 43 seconds — ABNORMAL HIGH (ref 24–36)

## 2019-12-16 LAB — POCT ACTIVATED CLOTTING TIME: Activated Clotting Time: 235 seconds

## 2019-12-16 LAB — LACTIC ACID, PLASMA
Lactic Acid, Venous: 10.2 mmol/L (ref 0.5–1.9)
Lactic Acid, Venous: 3.4 mmol/L (ref 0.5–1.9)

## 2019-12-16 LAB — TROPONIN I (HIGH SENSITIVITY): Troponin I (High Sensitivity): 367 ng/L (ref <18)

## 2019-12-16 LAB — PATHOLOGIST SMEAR REVIEW

## 2019-12-16 LAB — HEPARIN LEVEL (UNFRACTIONATED): Heparin Unfractionated: <0.1 IU/mL — ABNORMAL LOW (ref 0.30–0.70)

## 2019-12-16 LAB — MAGNESIUM: Magnesium: 2.5 mg/dL — ABNORMAL HIGH (ref 1.7–2.4)

## 2019-12-16 MED ORDER — DEXTROSE 50 % IV SOLN
INTRAVENOUS | Status: AC
  Administered 2019-12-16: 50 mL
  Filled 2019-12-16: qty 50

## 2019-12-16 MED ORDER — DEXTROSE 50 % IV SOLN
INTRAVENOUS | Status: AC
  Administered 2019-12-16: 25 mL
  Filled 2019-12-16: qty 50

## 2019-12-16 MED ORDER — PRO-STAT SUGAR FREE PO LIQD
30.0000 mL | Freq: Four times a day (QID) | ORAL | Status: DC
  Administered 2019-12-16 – 2019-12-17 (×4): 30 mL
  Filled 2019-12-16 (×3): qty 30

## 2019-12-16 MED ORDER — FENTANYL CITRATE (PF) 100 MCG/2ML IJ SOLN
INTRAMUSCULAR | Status: AC
  Filled 2019-12-16: qty 2

## 2019-12-16 MED ORDER — CHLORHEXIDINE GLUCONATE 0.12% ORAL RINSE (MEDLINE KIT)
15.0000 mL | Freq: Two times a day (BID) | OROMUCOSAL | Status: DC
  Administered 2019-12-16 – 2019-12-17 (×3): 15 mL via OROMUCOSAL

## 2019-12-16 MED ORDER — ORAL CARE MOUTH RINSE
15.0000 mL | OROMUCOSAL | Status: DC
  Administered 2019-12-16 – 2019-12-17 (×14): 15 mL via OROMUCOSAL

## 2019-12-16 MED ORDER — VITAL AF 1.2 CAL PO LIQD
1000.0000 mL | ORAL | Status: DC
  Administered 2019-12-16: 1000 mL

## 2019-12-16 MED ORDER — PERFLUTREN LIPID MICROSPHERE
INTRAVENOUS | Status: AC
  Administered 2019-12-16: 2 mL
  Filled 2019-12-16: qty 10

## 2019-12-16 MED ORDER — INSULIN ASPART 100 UNIT/ML ~~LOC~~ SOLN
0.0000 [IU] | SUBCUTANEOUS | Status: DC
  Administered 2019-12-16: 21:00:00 1 [IU] via SUBCUTANEOUS

## 2019-12-16 MED ORDER — PANTOPRAZOLE SODIUM 40 MG IV SOLR
40.0000 mg | Freq: Two times a day (BID) | INTRAVENOUS | Status: DC
  Administered 2019-12-16 – 2019-12-17 (×3): 40 mg via INTRAVENOUS
  Filled 2019-12-16 (×3): qty 40

## 2019-12-16 MED FILL — Medication: Qty: 1 | Status: AC

## 2019-12-16 NOTE — Progress Notes (Signed)
SLP Cancellation Note  Patient Details Name: [name] MRN: [id number] DOB: 1951-06-19   Pt now intubated. Our service will sign off.          Ripley Lovecchio L. Tivis Ringer, [place] CCC/SLP Acute Rehabilitation Services Office number [phone] Pager [phone]  Juan Quam Laurice 12/16/2019, 9:50 AM

## 2019-12-16 NOTE — Procedures (Signed)
Central Venous Catheter Insertion Procedure Note [name] [id number] 11/16/1951  Procedure: Insertion of Central Venous Catheter Indications: Frequent blood sampling  Procedure Details Consent: Risks of procedure as well as the alternatives and risks of each were explained to the (patient/caregiver).  Consent for procedure obtained. Time Out: Verified patient identification, verified procedure, site/side was marked, verified correct patient position, special equipment/implants available, medications/allergies/relevent history reviewed, required imaging and test results available.  Performed  Maximum sterile technique was used including antiseptics, cap, gloves, gown, hand hygiene, mask and sheet. Skin prep: Chlorhexidine; local anesthetic administered A antimicrobial bonded/coated triple lumen catheter was placed in the left internal jugular vein using the Seldinger technique and verified with ultrasound.  Evaluation Blood flow good Complications: No apparent complications Patient did tolerate procedure well. Chest X-ray ordered to verify placement.  CXR: pending.  [name] 12/16/2019, 1:16 AM

## 2019-12-16 NOTE — Progress Notes (Signed)
OT Cancellation Note  Patient Details Name: [name] MRN: [id number] DOB: May 28, 1951   Cancelled Treatment:    Reason Eval/Treat Not Completed: Patient not medically ready, pt coded last night and now intubated.  Will hold OT today, and follow to see as medically appropriate.   Jolaine Artist, OT Acute Rehabilitation Services Pager [phone] Office [phone]   [name] 12/16/2019, 7:54 AM

## 2019-12-16 NOTE — Progress Notes (Signed)
NAME:  [name], MRN:  [id number], DOB:  January 09, 1951, LOS: 71 ADMISSION DATE:  12/20/2019, CONSULTATION DATE:  12/07/2019 REFERRING MD:  TRH, CHIEF COMPLAINT: Altered mental status  Brief History   68 yo male with extensive PMH as below admitted from [facility] on 12/11 in septic shock and multiorgan failure.  Required intubation and CRRT.  Rapid improvement, extubated 12/13 and CRRT stopped 12/14.   Significant Hospital Events   12/10 > Admitted to [facility] 12/11 > Transferred to [facility] 12/12 > Initiated CVVH, off pressors 12/13 > extubated  12/14 > stopped CRRT 12/16 > to start iHD,did not tolerate from a hemodynamics standpoint.  12/18 > back on CRRT, Levophed Restarted on Levophed this AM. Awaiting blood transfusion for hgb 6.1. No complaints. 12/19  - pall care cnsult - full code + /sp 1 unit prbc overnight. Back on CRRT . Remains extubaed. Wife giving him water. Denied any questions.   Consults:  Nephrology PCCM Cardiology Palliative  Procedures:  ETT 12/11 >> 12/13 Right femoral CVL 12/11 >> Right IJ HD catheter 12/12 >>  Significant Diagnostic Tests:  12/10 [facility] >>chronic small vessel disease without acute intracranial abnormality 12/10 CT a/p >>  Mild gastric distension with air-fluid level, can be seen with gastroenteritis or gastroparesis. No gastric wall thickening or evidence of gastric outlet obstruction. Small volume of ascitesin the abdomen and pelvis, new from prior CT. Mild body wall edema. Large stool burden with colonic tortuosity, suggesting constipation. No bowel obstruction. Bilateral pleural effusions. Irregular streaky consolidative airspace opacities in both lower lobes, increased from CT last month. Pneumonia or aspiration considered. Severe aortic and branch atherosclerosis. Aortic Atherosclerosis   Micro Data:  12/10 SARS2 >> neg 12/11 MRSA PCR >> neg 12/11 RVP >> neg 12/10 UC >> neg 12/10 BCx 2 >> neg 12/12 trach asp >> MRSA and pseudomonas AFB  12/12 neg Fungal culture 12/12 >>>  Antimicrobials:  Ceftriaxone 12/10 >>12/11 Doxycycline 12/10  >> 12/11 Meropenem 12/12 >>12/14 Anidulafungin 1 dose 12/11 Cefepime 12/15  Vancomycin 12/11   Interim history/subjective:    PEA arrest overnight Intubated CVC and arterial line placed Shock, on Epi NE, Vaso  Objective   Blood pressure (!) 84/68, pulse 77, temperature (!) 97.1 F (36.2 C), resp. rate (!) 26, height 5\' 8"  (1.727 m), weight 82 kg, SpO2 100 %.    Vent Mode: PRVC FiO2 (%):  [30 %-100 %] 30 % Set Rate:  [16 bmp-18 bmp] 16 bmp Vt Set:  [540 mL] 540 mL PEEP:  [5 cmH20] 5 cmH20 Pressure Support:  [10 cmH20] 10 cmH20 Plateau Pressure:  [17 cmH20-22 cmH20] 20 cmH20   Intake/Output Summary (Last 24 hours) at 12/16/2019 1138 Last data filed at 12/16/2019 1100 Gross per 24 hour  Intake 4229.1 ml  Output 3154 ml  Net 1075.1 ml   Filed Weights   12/14/19 0500 12/15/19 0500 12/16/19 0411  Weight: 80.8 kg 78.8 kg 82 kg    .General Appearance:  Frail, deconditioned, critically ill appearing older adult M.  HEENT: NCAT. Pink mmm. Anicteric sclera. ETT secure  Lungs: Few scattered rhonchi. Symmetrical chest expansion.  Heart: s1s2 no rgm rrr  Abdomen:  Soft round ndnt. + bowel sounds  Extremities:  L BKA. No obvious joint deformity BUE RLE. No cyanosis or clubbing  Skin: clean, dry, without rash  Neurologic:  Sedated. Weakly following commands. PERRL Resolved Hospital Problem list   Cardiogenic shock Septic shock Septic coagulopathy Acute hypoxic respiratory failure   Assessment & Plan:  Multiorgan failure in the setting of septic shock 2/2 pseudomonas and MRSA pneumonia, cardiac arrest 12/15/2019-back on levophed with CRRT. Acute circulatory shock + Plan - On NE, Vaso. Weaned off Epi. MAP goal > 65  - No new fever or new leukocytosis; Continue Vanc Cefepime  - Stress dose steroids - hydrocortisone 50mg  q 6 hours  Acute respiratory failure requiring  intubation -intubated in setting of PEA arrest overnight Hx bronchiectasis with C tropicalis infection vs colonization  -followed by Dr. Carlis Abbott PSA, MRSA PNA as above  P - Continue vent support - WUA/SBT qD - PAD, VAP  - RASS goal 0  -abx as above   AKI (likely a mix of cardiorenal syndrome and ATN) P - nephrology following - Continue CRRT  RA: on plaquenil and chronic pred -  Back on stress dose steroids - Continue plaquenil and methadone   Anemia of chronic disease  Thrombocytopenia P -trend CBC, transfuse per unit protocol   DM w/ hyperglycemia  - Sensitive SSI   Goals of care: -Following PEA arrest and persistent shock 12/20, conversations between family and PCCM MD and PA. Code status updated to DNR. Continue current scope of medical interventions    Best practice:  Diet: EN  Pain/Anxiety/Delirium protocol (if indicated): chronic methadone, fentanyl with PAD  VAP protocol (if indicated): Yes  DVT prophylaxis: SQH GI prophylaxis: Yes Glucose control: SSI Mobility: PT/ OT Code Status: Full Family Communication: Pending 12/21 Disposition: ICU     LABS    PULMONARY Recent Labs  Lab 12/13/19 1415 12/15/19 2056  PHART  --  7.484*  PCO2ART  --  29.9*  PO2ART  --  513.0*  HCO3 21.5 22.6  TCO2 23 24  O2SAT 54.0 100.0    CBC Recent Labs  Lab 12/15/19 1930 12/15/19 2054 12/15/19 2056 12/16/19 0405  HGB 5.9* 6.3* 6.5* 8.5*  HCT 19.9* 20.8* 19.0* 26.8*  WBC 23.4* 23.0*  --  20.0*  PLT 62* 71*  --  62*    COAGULATION No results for input(s): INR in the last 168 hours.  CARDIAC  No results for input(s): TROPONINI in the last 168 hours. No results for input(s): PROBNP in the last 168 hours.   CHEMISTRY Recent Labs  Lab 12/13/19 0455 12/13/19 1415 12/14/19 0516 12/14/19 1512 12/15/19 0322 12/15/19 0323 12/15/19 1554 12/15/19 1930 12/15/19 2056 12/16/19 0405  NA 130*  --  134* 132*  --  135 133* 131* 131* 134*  K 5.3*  --  4.7  4.6  --  4.7 6.3* 6.1* 5.9* 5.6*  CL 96*   < > 101 100  --  101 103 100  --  103  CO2 20*   < > 25 27  --  27 21* 20*  --  21*  GLUCOSE 164*   < > 110* 271*  --  156* 257* 392*  --  198*  BUN 90*   < > 53* 39*  --  32* 36* 34*  --  40*  CREATININE 2.62*   < > 1.57* 1.17  --  0.93 0.91 1.03  --  1.16  CALCIUM 8.0*   < > 7.7* 7.5*  --  7.3* 7.3* 8.3*  --  7.4*  MG 2.9*  --  2.8*  --  2.6*  --   --  2.5*  --  2.5*  PHOS 4.9*   < > 3.2 2.6  --  1.9* 3.6 4.8*  --  4.2   < > = values in this  interval not displayed.   Estimated Creatinine Clearance: 57.3 mL/min (by C-G formula based on SCr of 1.16 mg/dL).   LIVER Recent Labs  Lab 12/10/19 0318 12/11/19 0221 12/12/19 0338 12/15/19 0322 12/15/19 0323 12/15/19 1554 12/15/19 1930 12/16/19 0405  AST 78* 61* 42* 32  --   --  42*  --   ALT 172* 129* 93* 44  --   --  41  --   ALKPHOS 121 116 107 82  --   --  75  --   BILITOT 2.3* 2.3* 2.9* 2.6*  --   --  2.3*  --   PROT 5.6* 5.2* 5.9* 4.3*  --   --  3.5*  --   ALBUMIN 2.5* 2.3* 3.2* 1.9* 2.0* 1.6* 1.5* 1.8*     INFECTIOUS Recent Labs  Lab 12/15/19 1930 12/15/19 2341 12/16/19 0526  LATICACIDVEN 10.5* 10.2* 3.4*     ENDOCRINE CBG (last 3)  Recent Labs    12/15/19 2129 12/16/19 0114 12/16/19 0409  GLUCAP 345* 235* 189*    CRITICAL CARE Performed by: Cristal Generous   Total critical care time: 35 minutes   Critical care time was exclusive of separately billable procedures and treating other patients.  Critical care was necessary to treat or prevent imminent or life-threatening deterioration.  Critical care was time spent personally by me on the following activities: development of treatment plan with patient and/or surrogate as well as nursing, discussions with consultants, evaluation of patient's response to treatment, examination of patient, obtaining history from patient or surrogate, ordering and performing treatments and interventions, ordering and review of  laboratory studies, ordering and review of radiographic studies, pulse oximetry and re-evaluation of patient's condition.  Eliseo Gum MSN, AGACNP-BC [place] [phone] If no answer, [phone] 12/16/2019, 11:38 AM

## 2019-12-16 NOTE — Progress Notes (Signed)
CRITICAL VALUE ALERT  Critical Value:  Hgb 6.3  Date & Time Notied:  12/15/19 2100  Provider Notified: Gleason, [name] and Elink  Orders Received/Actions taken: Transfuse 2 Unit PRBC

## 2019-12-16 NOTE — Progress Notes (Signed)
  Echocardiogram 2D Echocardiogram has been performed.  [name] A Ernestyne Caldwell 12/16/2019, 9:45 AM

## 2019-12-16 NOTE — Progress Notes (Signed)
Daily Progress Note   Patient Name: [name]       Date: 12/16/2019 DOB: 04-May-1951  Age: 68 y.o. MRN#: [id number] Attending Physician: Audria Nine, DO Primary Care Physician: [name], MD Admit Date: 12/26/2019  Reason for Consultation/Follow-up: Establishing goals of care  Subjective/GOC: PEA arrest last night with ROSC. Patient now intubated, on fentanyl infusion, requiring two pressors and CRRT. Fentanyl basal rate recently increased by RN for facial grimacing and reports of pain per wife at bedside.   Wife, [name] at bedside. This NP familiar with patient and wife from conversations last week. [name] is appropriately tearful. Her and Yaviel have been together their entire lives. Never apart. [name] remains hopeful but also seems to understand how critically ill Alezander is. "Hoping and praying." We did discuss nephrology input and possible discontinuation of CRRT in 24-48 hours if no clinical improvement. Wife understands and spoke with Dr. Carolin Sicks this morning.   [name] is saddened by the fact that their 4 grandchildren cannot visit [place]. Their family is very close. Briefly explained visitation policy (4 individuals) with shift to comfort focused care plan.   Emotional/spiritual support provided. Reassured of ongoing palliative support this admission. Wife has PMT contact information.   Length of Stay: 10  Current Medications: Scheduled Meds:  . chlorhexidine  15 mL Mouth Rinse BID  . chlorhexidine gluconate (MEDLINE KIT)  15 mL Mouth Rinse BID  . Chlorhexidine Gluconate Cloth  6 each Topical Q2200  . feeding supplement (ENSURE ENLIVE)  237 mL Oral BID BM  . heparin injection (subcutaneous)  5,000 Units Subcutaneous Q8H  . hydrocortisone sod succinate (SOLU-CORTEF) inj   50 mg Intravenous Q6H  . hydroxychloroquine  200 mg Oral Daily  . insulin aspart  0-9 Units Subcutaneous Q4H  . mouth rinse  15 mL Mouth Rinse 10 times per day  . methadone  10 mg Oral Q6H  . midodrine  10 mg Oral TID WC  . multivitamin with minerals  1 tablet Oral Daily  . pantoprazole (PROTONIX) IV  40 mg Intravenous Q12H  . sodium chloride flush  10-40 mL Intracatheter Q12H    Continuous Infusions: . sodium chloride 10 mL/hr at 12/15/19 1000  . ceFEPime (MAXIPIME) IV Stopped (12/16/19 0943)  . epinephrine Stopped (12/16/19 0258)  . fentaNYL infusion INTRAVENOUS 75 mcg/hr (12/16/19  1500)  . heparin    . lactated ringers Stopped (12/10/19 0738)  . norepinephrine (LEVOPHED) Adult infusion 30 mcg/min (12/16/19 1500)  . prismasol BGK 2/2.5 dialysis solution 1,500 mL/hr at 12/16/19 1356  . prismasol BGK 2/2.5 replacement solution 300 mL/hr at 12/16/19 0254  . prismasol BGK 2/2.5 replacement solution 400 mL/hr at 12/16/19 0249  . vasopressin (PITRESSIN) infusion - *FOR SHOCK* 0.02 Units/min (12/16/19 1500)    PRN Meds: sodium chloride, acetaminophen (TYLENOL) oral liquid 160 mg/5 mL **OR** acetaminophen, Gerhardt's butt cream, heparin, heparin, heparin, heparin, methadone, naLOXone (NARCAN)  injection, polyethylene glycol, polyvinyl alcohol, sodium chloride flush  Physical Exam Vitals and nursing note reviewed.  Constitutional:      Appearance: He is cachectic. He is ill-appearing.     Interventions: He is sedated and intubated.  HENT:     Head: Normocephalic and atraumatic.  Cardiovascular:     Rate and Rhythm: Regular rhythm.  Pulmonary:     Effort: No tachypnea, accessory muscle usage or respiratory distress. He is intubated.  Skin:    General: Skin is warm and dry.     Coloration: Skin is pale.  Neurological:     Comments: Intubated on fentanyl gtt            Vital Signs: BP (!) 94/42   Pulse 79   Temp (!) 96.1 F (35.6 C) (Axillary) Comment: given warm  blanketsblankets   Resp 10   Ht _0  (1.727 m)   Wt 82 kg   SpO2 100%   BMI 27.49 kg/m  SpO2: SpO2: 100 % O2 Device: O2 Device: Ventilator O2 Flow Rate: O2 Flow Rate (L/min): 1 L/min  Intake/output summary:   Intake/Output Summary (Last 24 hours) at 12/16/2019 1544 Last data filed at 12/16/2019 1500 Gross per 24 hour  Intake 3364.98 ml  Output 2591 ml  Net 773.98 ml   LBM: Last BM Date: 12/14/19 Baseline Weight: Weight: 82 kg Most recent weight: Weight: 82 kg       Palliative Assessment/Data: PPS 20%    Flowsheet Rows     Most Recent Value  Intake Tab  Referral Department  Critical care  Unit at Time of Referral  ICU  Palliative Care Primary Diagnosis  Cardiac  Palliative Care Type  New Palliative care  Reason for referral  Clarify Goals of Care  Date first seen by Palliative Care  12/10/19  Clinical Assessment  Palliative Performance Scale Score  20%  Psychosocial & Spiritual Assessment  Palliative Care Outcomes  Patient/Family meeting held?  Yes  Who was at the meeting?  patient and wife  Palliative Care Outcomes  Clarified goals of care, Provided psychosocial or spiritual support, ACP counseling assistance      Patient Active Problem List   Diagnosis Date Noted  . Shock ([place]) 12/10/2019  . Palliative care by specialist   . Acute respiratory failure ([place])   . Encounter for central line placement   . AKI (acute kidney injury) ([place]) 12/21/2019  . Thrombocytopenia ([place]) 11/25/2019  . Bronchiectasis ([place]) 11/25/2019  . DJD (degenerative joint disease) 11/25/2019  . Elevated IgE level 11/25/2019  . Type 2 diabetes mellitus with peripheral vascular disease ([place]) 09/11/2019  . Chronic diastolic heart failure ([place]) 09/04/2019  . Pressure injury of skin 09/04/2019  . Pacemaker   . Aortic valve stenosis   . Heart block AV third degree ([place])   . Opioid dependence on maintenance agonist therapy, no symptoms ([place]) 07/01/2019  . Elevated troponin 07/01/2019  .  LBBB (left bundle branch block) 07/01/2019  . Current chronic use of systemic steroids 07/01/2019  . Loculated pleural effusion 09/09/2016  . S/P BKA (below knee amputation) unilateral, left ([place]) 08/09/2016  . Status post amputation of toe of left foot ([place]) 04/27/2016  . Systolic heart failure ([place]) 07/09/2015  . Prolonged QT interval 08/14/2014  . Gait disorder 08/14/2014  . Seizure ([place]) 08/13/2014  . Normocytic anemia 08/13/2014  . Altered mental status 06/18/2014  . Closed dislocation of metatarsophalangeal (joint) 03/26/2013  . DM (diabetes mellitus) type II controlled with renal manifestation ([place]) 12/11/2012  . Hyperlipidemia 12/11/2012  . Coronary artery disease   . Rheumatoid arthritis ([place])   . Hypertension     Palliative Care Assessment & Plan   Patient Profile: 68 y.o. 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 Extubated 12/13, CRRT stopped 12/14. Being treated with antibiotics for pseudomonas and MRSA tracheobronchitis vs. Pneumonia. Shock respiratory failure/liver improved. Patient's kidney function worsening on 12/15. Nephrology following. Plan for Lasix 61m IV today and concern that he may need intermittent hemodialysis. Patient with severe aortic stenosis. Cardiology following. TAVR team evaluated and given patient's multiple co-morbidities, he is not felt to be a candidate for TAVR. Recommending palliative medicine consultation for goals of care. PEA arrest overnight 12/21. Patient intubated/sedated requiring pressors and CRRT. Poor prognosis  Assessment: Septic shock Pseudomonas and MRSA tracheobronchitis vs pneumonia Severe aortic stenosis (not a candidate for TAVR) AKI (likely multifactorial from ATN and cardiorenal) Acute  respiratory failure requiring mechanical ventilation Severe protein calorie malnutrition Dysphagia Shock liver RA Anemia of chronic disease Thrombocytopenia DM with hyperglycemia Hx of CAD, CABG Hx of left BKA  Recommendations/Plan:  Family decision for DNR following code blue/PEA arrest.   Continue current plan of care and medical management. Wife remains hopeful and wishes to continue current plan of care. She is appropriately tearful and does seem to understand how critically ill JDerrilis. Ongoing palliative discussions.  Per nephrology, possible discontinuation of CRRT after 24-48 hours if no clinical improvement. Wife aware of this. PMT will follow.  Code Status: DNR    Code Status Orders  (From admission, onward)         Start     Ordered   12/06/19 0311  Full code  Continuous     12/06/19 0310        Code Status History    Date Active Date Inactive Code Status Order ID Comments User Context   10/27/2019 1812 10/28/2019 2044 Full Code [id number] MRudean Curt MD Inpatient   09/11/2019 2350 09/15/2019 2027 Full Code [id number] KRise Patience MD ED   08/29/2019 1617 09/04/2019 2130 Full Code [id number] KAbigail Butts, PA-C ED   07/01/2019 1943 07/03/2019 1917 Full Code [id number] GEtta Quill DO ED   10/15/2016 0744 10/23/2016 0312 Full Code [id number] LOswald Hillock MD Inpatient   09/02/2016 1918 09/05/2016 1503 Full Code [id number] OVianne Bulls MD ED   08/26/2016 1210 08/30/2016 1853 Full Code [id number] LOrvan Falconer MD Inpatient   08/13/2014 0336 08/14/2014 1744 Full Code [id number] STruett Mainland DO Inpatient   12/11/2012 0312 12/11/2012 2058 Full Code [id number] CKarlyn Agee MD Inpatient   Advance Care Planning Activity  Prognosis:   Poor prognosis  Discharge Planning:  To Be Determined  Care plan was discussed with wife, RN  Thank you for allowing the Palliative Medicine Team to assist in the care of this patient.   Time In:  1500- Time Out: 1520 Total Time 20 Prolonged Time Billed no      Greater than 50%  of this time was spent counseling and coordinating care related to the above assessment and plan.  Ihor Dow, DNP, FNP-C Palliative Medicine Team  Phone: [phone] Fax: [phone]  Please contact Palliative Medicine Team phone at [phone] for questions and concerns.

## 2019-12-16 NOTE — Progress Notes (Signed)
While obtaining shift report at 1915 pt was very hypotensive with SBP's in the 70's and MAP's in the 14's with levophed maxed at 40 mcg. Green Elink button pushed for MD notification. Just Minutes later pt became unresponsive and was pulseless. CPR was iniated - (See code sheet documentation)- Rosc was achieved. Pt following commands afterwards, and still hypotensive. Pt started on Epi, and Vaso along with levophed for blood pressure support.

## 2019-12-16 NOTE — Progress Notes (Signed)
Nephrology notified [name]) of pt to be restarted on CRRT. Continue with current CRRT orders but discontinue heparin through CRRT machine due to APTT >200.   RN will continue to monitor pt closely.

## 2019-12-16 NOTE — Progress Notes (Signed)
[facility] NEPHROLOGY PROGRESS NOTE  Assessment/ Plan: Pt is a 68 y.o. yo male with history of RA, bronchiectasis, severe AS admitted from [facility] with shock, multiorgan failure and AKI.  #AKI, oliguric: Suspect ATN from sepsis concomitant with acute cardiorenal syndrome/severe AS.  Refractory to IV diuretics. CRRT DC'd on 12/14 and tried intermittent HD on 12/16.  Patient did not tolerate IHD.  Resumed CRRT on 12/18.  Intermittent hypotension with CRRT.  Not much UF.  BP was acceptable today.  Changed to 2K bath.  Monitor potassium and electrolytes.   I am not sure if patient can tolerate outpatient HD given his severe AS and deconditioning.  He is now DNR, cardiac arrest seems like a poor prognosis.  May discontinue CRRT after 24 to 48 hours if no clinical improvement.  #Acute shock likely cardiogenic and sepsis: Currently off pressors, blood pressure is soft.  On antibiotics per primary team.    #Acute respiratory failure, history of bronchiectasis: Reintubated.  #Severe AS, cardiac arrest: He had a cardiac arrest on 12/20.  #Hyponatremia, hypervolemic: Now on CRRT.  #Anemia of critical illness: Receiving blood transfusion.  Iron sat 31%.   #Hypophosphatemia: Replete K-Phos.  Improved  Discussed with ICU nurse.  Subjective: Event noted.  Patient had a cardiac arrest and had to stop CRRT for some time.  The bath changed to 2K.  Intubated and on pressors.  Discussed with ICU nurse Objective Vital signs in last 24 hours: Vitals:   12/16/19 0815 12/16/19 0830 12/16/19 0845 12/16/19 0900  BP:    (!) 119/44  Pulse: 79 80 77 75  Resp: (!) _0 Temp:      TempSrc:      SpO2: 100% 100% 100% 100%  Weight:      Height:       Weight change: 3.2 kg  Intake/Output Summary (Last 24 hours) at 12/16/2019 0916 Last data filed at 12/16/2019 0900 Gross per 24 hour  Intake 4482.36 ml  Output 3157 ml  Net 1325.36 ml       Labs: Basic Metabolic Panel: Recent Labs   Lab 12/15/19 1554 12/15/19 1930 12/15/19 2056 12/16/19 0405  NA 133* 131* 131* 134*  K 6.3* 6.1* 5.9* 5.6*  CL 103 100  --  103  CO2 21* 20*  --  21*  GLUCOSE 257* 392*  --  198*  BUN 36* 34*  --  40*  CREATININE 0.91 1.03  --  1.16  CALCIUM 7.3* 8.3*  --  7.4*  PHOS 3.6 4.8*  --  4.2   Liver Function Tests: Recent Labs  Lab 12/12/19 0338 12/15/19 0322 12/15/19 1554 12/15/19 1930 12/16/19 0405  AST 42* 32  --  42*  --   ALT 93* 44  --  41  --   ALKPHOS 107 82  --  75  --   BILITOT 2.9* 2.6*  --  2.3*  --   PROT 5.9* 4.3*  --  3.5*  --   ALBUMIN 3.2* 1.9* 1.6* 1.5* 1.8*   No results for input(s): LIPASE, AMYLASE in the last 168 hours. No results for input(s): AMMONIA in the last 168 hours. CBC: Recent Labs  Lab 12/14/19 0516 12/15/19 0322 12/15/19 1930 12/15/19 2054 12/15/19 2056 12/16/19 0405  WBC 20.3* 19.8* 23.4* 23.0*  --  20.0*  HGB 8.8* 7.8* 5.9* 6.3* 6.5* 8.5*  HCT 27.2* 24.0* 19.9* 20.8* 19.0* 26.8*  MCV 95.1 95.6 105.3* 101.0*  --  95.0  PLT 77*  77* 62* 71*  --  62*   Cardiac Enzymes: No results for input(s): CKTOTAL, CKMB, CKMBINDEX, TROPONINI in the last 168 hours. CBG: Recent Labs  Lab 12/15/19 1108 12/15/19 1509 12/15/19 2129 12/16/19 0114 12/16/19 0409  GLUCAP 128* 253* 345* 235* 189*    Iron Studies:  No results for input(s): IRON, TIBC, TRANSFERRIN, FERRITIN in the last 72 hours. Studies/Results: DG CHEST PORT 1 VIEW  Result Date: 12/16/2019 CLINICAL DATA:  Central line placement EXAM: PORTABLE CHEST 1 VIEW COMPARISON:  December 15, 2019 FINDINGS: The endotracheal tube terminates above the carina. The right-sided central venous catheter is well position with tip terminating near the SVC. The new left-sided central venous catheter tip projects over the proximal SVC. There is no left-sided pneumothorax. There is a multi lead left-sided pacemaker in place. The lung volumes are low. Bibasilar airspace opacities are again noted, right  worse than left. There is elevation of the right hemidiaphragm. There is no right-sided pneumothorax. IMPRESSION: 1. Lines and tubes as above.  No pneumothorax. 2. Otherwise, no significant interval change. Electronically Signed   By: Constance Holster M.D.   On: 12/16/2019 01:29   DG CHEST PORT 1 VIEW  Result Date: 12/15/2019 CLINICAL DATA:  Evaluate ETT placement EXAM: PORTABLE CHEST 1 VIEW COMPARISON:  December 15, 2019 FINDINGS: The ETT is in good position. Pacemaker is identified. A right central line terminates in the central SVC. No pneumothorax. No nodules or masses. Mild atelectasis in the right base is stable. No other interval changes. IMPRESSION: 1. Support apparatus as above. 2. Mild right basilar atelectasis.  No other change. Electronically Signed   By: Dorise Bullion III M.D   On: 12/15/2019 20:16   DG CHEST PORT 1 VIEW  Result Date: 12/15/2019 CLINICAL DATA:  Rhonchi at the right lung base. EXAM: PORTABLE CHEST 1 VIEW COMPARISON:  December 09, 2019 FINDINGS: The heart size remains mildly enlarged. The patient is status post prior median sternotomy. There is a right-sided central venous catheter in place with no evidence for pneumothorax. The tip projects over the SVC. There are persistent low lung volumes. There is a right basilar airspace opacity favored to represent atelectasis with an infiltrate not excluded. There are small bilateral pleural effusions, right greater than left. IMPRESSION: 1. Lines and tubes as above. 2. No significant interval change. Persistent bibasilar airspace opacities, right worse than left with small bilateral pleural effusions. Electronically Signed   By: Constance Holster M.D.   On: 12/15/2019 03:42   DG Abd Portable 1V  Result Date: 12/15/2019 CLINICAL DATA:  NG tube placement. EXAM: PORTABLE ABDOMEN - 1 VIEW COMPARISON:  Chest radiograph earlier this day. FINDINGS: Enteric tube in place with tip and side-port below the diaphragm in the stomach.  Endotracheal tube, central line, and pacemaker partially included. Air in stool throughout colon in the upper abdomen in a nonobstructive pattern. IMPRESSION: Enteric tube in place with tip and side-port below the diaphragm in the stomach. Electronically Signed   By: Keith Rake M.D.   On: 12/15/2019 23:23    Medications: Infusions: . sodium chloride 10 mL/hr at 12/15/19 1000  . ceFEPime (MAXIPIME) IV 2 g (12/16/19 0909)  . epinephrine Stopped (12/16/19 0258)  . fentaNYL infusion INTRAVENOUS 75 mcg/hr (12/16/19 0900)  . heparin    . lactated ringers Stopped (12/10/19 0738)  . norepinephrine (LEVOPHED) Adult infusion 42 mcg/min (12/16/19 0910)  . prismasol BGK 2/2.5 dialysis solution 1,500 mL/hr at 12/16/19 0644  . prismasol BGK 2/2.5 replacement solution  300 mL/hr at 12/16/19 0254  . prismasol BGK 2/2.5 replacement solution 400 mL/hr at 12/16/19 0249  . vancomycin Stopped (12/15/19 1439)  . vasopressin (PITRESSIN) infusion - *FOR SHOCK* 0.03 Units/min (12/16/19 0900)    Scheduled Medications: . chlorhexidine  15 mL Mouth Rinse BID  . chlorhexidine gluconate (MEDLINE KIT)  15 mL Mouth Rinse BID  . Chlorhexidine Gluconate Cloth  6 each Topical Q2200  . feeding supplement (ENSURE ENLIVE)  237 mL Oral BID BM  . heparin injection (subcutaneous)  5,000 Units Subcutaneous Q8H  . hydrocortisone sod succinate (SOLU-CORTEF) inj  50 mg Intravenous Q6H  . hydroxychloroquine  200 mg Oral Daily  . insulin aspart  0-15 Units Subcutaneous Q4H  . mouth rinse  15 mL Mouth Rinse 10 times per day  . methadone  10 mg Oral Q6H  . midodrine  10 mg Oral TID WC  . multivitamin with minerals  1 tablet Oral Daily  . sodium chloride flush  10-40 mL Intracatheter Q12H    have reviewed scheduled and prn medications.  Physical Exam: General: Intubated, sedated Heart:RRR, s1s2 nl, no rubs Lungs: Coarse breath sound bilateral Abdomen:soft, Non-tender, non-distended Extremities: LE edema + Dialysis  Access: Right IJ temporary catheter placed on 12/12.  Cynthie Garmon Prasad Alizae Bechtel 12/16/2019,9:16 AM  LOS: 10 days  Pager: [phone]

## 2019-12-16 NOTE — Progress Notes (Signed)
Nutrition Follow up / Consult  DOCUMENTATION CODES:   Not applicable  INTERVENTION:  Vital AF 1.2 at 50 ml/h (1200 ml per day)  Pro-stat 30 ml QID  Provides 1840 kcal, 150 gm protein, 973 ml free water daily  Do not recommend bolus feeding at this time.   NUTRITION DIAGNOSIS:   Inadequate oral intake related to acute illness as evidenced by NPO status.  Ongoing   GOAL:   Patient will meet greater than or equal to 90% of their needs   Progressing with TF initiation  MONITOR:   Vent status, Labs, Weight trends, TF tolerance, Skin  REASON FOR ASSESSMENT:   Consult Enteral/tube feeding initiation and management  ASSESSMENT:   69 yo male admitted with shock, likely combination of septic and cardiogenic, anuric AKI with hyperkalemic and metabolic acidosis. PMH includes CKD III, CAd/CABG, PVD s/p L. BKA, DM, HTN, CHF   12/13- extubated  12/18- CRRT resumed 12/20- cardiac arrest, re-intubated  Palliative Care team following. Patient is DNR. Plans to continue CRRT, but will stop if no improvement within the next 24-48 hours.   Patient is currently intubated on ventilator support, requiring multiple pressors. MV: 8.8 L/min Temp (24hrs), Avg:97.3 F (36.3 C), Min:95.8 F (35.4 C), Max:98.5 F (36.9 C)  Labs reviewed. Sodium 134 (L), potassium 5.6 (H), magnesium 2.5 (H) CBG's: [phone]  Medications reviewed and include epinephrine, vasopressin, levophed, solu-cortef, novolog, MVI.  Admission weight: 82 kg  Current weight: 82 kg   I/O: +4.6 L since admit UOP: 0 ml x 24 hrs   Diet Order:   Diet Order    None      EDUCATION NEEDS:   Not appropriate for education at this time  Skin:  Skin Assessment: Skin Integrity Issues: Skin Integrity Issues:: Stage II, Stage I Stage I: toe Stage II: sacrum  Last BM:  12/21 type 4  Height:   Ht Readings from Last 1 Encounters:  12/08/19 5\' 8"  (1.727 m)    Weight:   Wt Readings from Last 1 Encounters:   12/16/19 82 kg    Ideal Body Weight:  65.9 kg (left BKA)  BMI:  Body mass index is 27.49 kg/m.  Estimated Nutritional Needs:   Kcal:  1740  Protein:  150-165 gm  Fluid:  >/= 1.7 L/day   Molli Barrows, RD, LDN, [place] Pager [phone] After Hours Pager [phone]

## 2019-12-16 NOTE — Progress Notes (Signed)
Follow-up referral from weekend Chaplain with patient who had a Code Blue.  Met patient's wife, [name] who was present during Code Blue.  Briefly established a relationship of care and concern for patient and wife.  She had obviously been affected by the Code Blue.  She hopes her husband can get his strength back and overcome this set back as he had handled getting well enough to get off dialysis in the past few weeks.   Prayed with wife at husband's bedside.  Invited wife to call on Chaplain at any time she needs extra support.  De Burrs Chaplain Resident

## 2019-12-16 NOTE — Progress Notes (Signed)
PCCM progress note:  Family meeting held with myself, Dr. Randell Patient, patient's wife and two children overnight after code blue and subsequent ROSC.  He is requiring three pressors to maintain map of 65, CRRT held until BP stabilized.  Had coffee ground emesis with drop in Hgb requiring 1 unit PRBC's.  Poor prognosis discussed with family, they are understanding of his very critical state and decided to change code status to DNR.   For right now desire continued pressors, transfusion and CRRT as blood pressure will allow. Code status changed.

## 2019-12-17 ENCOUNTER — Inpatient Hospital Stay (HOSPITAL_COMMUNITY): Payer: Medicare HMO

## 2019-12-17 DIAGNOSIS — F411 Generalized anxiety disorder: Secondary | ICD-10-CM

## 2019-12-17 DIAGNOSIS — G8929 Other chronic pain: Secondary | ICD-10-CM

## 2019-12-17 LAB — GLUCOSE, CAPILLARY
Glucose-Capillary: 108 mg/dL — ABNORMAL HIGH (ref 70–99)
Glucose-Capillary: 143 mg/dL — ABNORMAL HIGH (ref 70–99)
Glucose-Capillary: 194 mg/dL — ABNORMAL HIGH (ref 70–99)
Glucose-Capillary: 54 mg/dL — ABNORMAL LOW (ref 70–99)
Glucose-Capillary: 61 mg/dL — ABNORMAL LOW (ref 70–99)
Glucose-Capillary: 81 mg/dL (ref 70–99)

## 2019-12-17 LAB — HEPARIN LEVEL (UNFRACTIONATED): Heparin Unfractionated: <0.1 IU/mL — ABNORMAL LOW (ref 0.30–0.70)

## 2019-12-17 LAB — RENAL FUNCTION PANEL
Albumin: 1.8 g/dL — ABNORMAL LOW (ref 3.5–5.0)
Anion gap: 13 (ref 5–15)
BUN: 24 mg/dL — ABNORMAL HIGH (ref 8–23)
CO2: 16 mmol/L — ABNORMAL LOW (ref 22–32)
Calcium: 7.5 mg/dL — ABNORMAL LOW (ref 8.9–10.3)
Chloride: 105 mmol/L (ref 98–111)
Creatinine, Ser: 0.84 mg/dL (ref 0.61–1.24)
GFR calc Af Amer: >60 mL/min (ref >60)
GFR calc non Af Amer: >60 mL/min (ref >60)
Glucose, Bld: 83 mg/dL (ref 70–99)
Phosphorus: 3.5 mg/dL (ref 2.5–4.6)
Potassium: 4 mmol/L (ref 3.5–5.1)
Sodium: 134 mmol/L — ABNORMAL LOW (ref 135–145)

## 2019-12-17 LAB — CBC
HCT: 27 % — ABNORMAL LOW (ref 39.0–52.0)
Hemoglobin: 8.6 g/dL — ABNORMAL LOW (ref 13.0–17.0)
MCH: 30.8 pg (ref 26.0–34.0)
MCHC: 31.9 g/dL (ref 30.0–36.0)
MCV: 96.8 fL (ref 80.0–100.0)
Platelets: 30 10*3/uL — ABNORMAL LOW (ref 150–400)
RBC: 2.79 MIL/uL — ABNORMAL LOW (ref 4.22–5.81)
RDW: 24.1 % — ABNORMAL HIGH (ref 11.5–15.5)
WBC: 28.3 10*3/uL — ABNORMAL HIGH (ref 4.0–10.5)
nRBC: 5.8 % — ABNORMAL HIGH (ref 0.0–0.2)

## 2019-12-17 LAB — MAGNESIUM: Magnesium: 2.5 mg/dL — ABNORMAL HIGH (ref 1.7–2.4)

## 2019-12-17 LAB — APTT: aPTT: 40 seconds — ABNORMAL HIGH (ref 24–36)

## 2019-12-17 MED ORDER — LORAZEPAM 2 MG/ML IJ SOLN
2.0000 mg | Freq: Once | INTRAMUSCULAR | Status: AC
  Administered 2019-12-17: 2 mg via INTRAVENOUS
  Filled 2019-12-17: qty 1

## 2019-12-17 MED ORDER — HYDROMORPHONE BOLUS VIA INFUSION
1.0000 mg | INTRAVENOUS | Status: DC | PRN
  Administered 2019-12-17 (×4): 2 mg via INTRAVENOUS
  Filled 2019-12-17: qty 2

## 2019-12-17 MED ORDER — DEXTROSE 50 % IV SOLN
25.0000 g | INTRAVENOUS | Status: AC
  Administered 2019-12-17: 25 g via INTRAVENOUS

## 2019-12-17 MED ORDER — LORAZEPAM 2 MG/ML IJ SOLN
1.0000 mg | INTRAMUSCULAR | Status: DC | PRN
  Administered 2019-12-17: 2 mg via INTRAVENOUS
  Filled 2019-12-17: qty 1

## 2019-12-17 MED ORDER — LORAZEPAM 2 MG/ML IJ SOLN
1.0000 mg | INTRAMUSCULAR | Status: DC
  Administered 2019-12-17: 1 mg via INTRAVENOUS
  Filled 2019-12-17: qty 1

## 2019-12-17 MED ORDER — GLYCOPYRROLATE 0.2 MG/ML IJ SOLN
0.3000 mg | Freq: Once | INTRAMUSCULAR | Status: AC
  Administered 2019-12-17: 0.3 mg via INTRAVENOUS
  Filled 2019-12-17: qty 2

## 2019-12-17 MED ORDER — CHLORHEXIDINE GLUCONATE CLOTH 2 % EX PADS
6.0000 | MEDICATED_PAD | Freq: Every day | CUTANEOUS | Status: DC
  Administered 2019-12-17: 6 via TOPICAL

## 2019-12-17 MED ORDER — SODIUM CHLORIDE 0.9 % IV SOLN
0.5000 mg/h | INTRAVENOUS | Status: DC
  Administered 2019-12-17: 1 mg/h via INTRAVENOUS
  Administered 2019-12-17: 2 mg/h via INTRAVENOUS
  Filled 2019-12-17 (×2): qty 2.5

## 2019-12-17 MED ORDER — GLYCOPYRROLATE 0.2 MG/ML IJ SOLN: 0.2000 mg | INTRAMUSCULAR | Status: DC | PRN

## 2019-12-17 MED ORDER — DEXTROSE 5 % IV SOLN: INTRAVENOUS | Status: DC

## 2019-12-23 ENCOUNTER — Telehealth: Payer: Self-pay

## 2019-12-23 LAB — POCT ACTIVATED CLOTTING TIME: Activated Clotting Time: 158 seconds

## 2019-12-23 NOTE — Telephone Encounter (Signed)
Received dc from [facility] (original)   Dc is for burial and a patient of Doctor Ruthann Cancer. DC will be taken to [facility] 3[facility] for signature.  On 12/25/2019 Received dc back from Doctor Mannam.  I called the funeral home to let them know that the dc will be mailed to [facility] per their request.

## 2019-12-27 NOTE — Progress Notes (Addendum)
Daily Progress Note   Patient Name: [name]       Date: 12/30/19 DOB: 1951-12-16  Age: 69 y.o. MRN#: [id number] Attending Physician: Audria Nine, DO Primary Care Physician: [name], MD Admit Date: 12/14/2019  Reason for Consultation/Follow-up: Establishing goals of care  Subjective: Patient intermittently opens eyes to voice. Nods head 'yes' that he is in pain. Remains critically ill on levo and epi. CRRT discontinued this morning. Intubated/mechanically ventilated.  GOC: [name] with patient's wife and two children Santiago Glad and Olivia Mackie) if family conference room. Reviewed course of hospitalization including diagnoses, interventions, plan of care, and unfortunate poor prognosis. Discussed multiple chronic conditions that patient has endured for many years, including chronic pain. Discussed in detail multiorgan failure. Family understands CRRT was discontinued this morning because he is not tolerating. Explained that vasopressor support and breathing machine are keeping him alive, and providers are surprised that he survived resuscitation two nights ago. Family understands critical condition and poor prognosis.   Educated on medical recommendation for transition to comfort measures only, in order to allow peace, comfort, and dignity at the EOL. Explained compassionated extubation and use of symptom management medications to ensure comfort and that he does not suffer at the EOL. Prepared family that he will likely pass quickly once vasopressor medications weaned off and extubated. Discussed minutes-hours, unlikely days prognosis.   Family consensus that they do not wish to see [name] "suffer." He has suffered for many years with chronic conditions and pain. Wife and children  appropriately tearful and supportive of each other. Daughter sharing that he will be reunited in heaven with deceased family members and pain free.   Wife initially asking to keep [name] on the ventilator another two days. Explained that care team is not here to rush this decision but concern that we are prolonging pain and suffering, and again requiring two continuous infusions to keep blood pressure stable. Medically recommended they consider compassionate extubation today. Wife requesting more time and wishes to go back in the room with [name]. Emotional/spiritual support provided.   Wife and children agreeable to transition to dilaudid gtt since [name] continues to nod head 'yes' to pain and with facial grimacing. Orders for dilaudid gtt and discussed with RN.    **F/u this afternoon. When asked [name] will nod his head 'yes' to  pain. Dilaudid gtt increased. Family agreeable to try prn ativan for anxiety. Reassured of ongoing palliative follow-up.  ADDENDUM 1600: F/u with family at bedside. Family appropriately tearful but ready to remove the ventilator. Daughter states "he is tired." Educated on compassionate extubation to comfort measures, emphasizing focus on comfort, peace, dignity at EOL. Educated on symptom management medications. Prepared family for minutes-hours-days prognosis. Emotional/spiritual support provided.    Length of Stay: 11  Current Medications: Scheduled Meds:  . chlorhexidine  15 mL Mouth Rinse BID  . chlorhexidine gluconate (MEDLINE KIT)  15 mL Mouth Rinse BID  . Chlorhexidine Gluconate Cloth  6 each Topical Q2200  . feeding supplement (PRO-STAT SUGAR FREE 64)  30 mL Per Tube QID  . hydrocortisone sod succinate (SOLU-CORTEF) inj  50 mg Intravenous Q6H  . hydroxychloroquine  200 mg Oral Daily  . insulin aspart  0-9 Units Subcutaneous Q4H  . mouth rinse  15 mL Mouth Rinse 10 times per day  . methadone  10 mg Oral Q6H  . midodrine  10 mg Oral TID WC  . multivitamin with  minerals  1 tablet Oral Daily  . pantoprazole (PROTONIX) IV  40 mg Intravenous Q12H  . sodium chloride flush  10-40 mL Intracatheter Q12H    Continuous Infusions: . sodium chloride 10 mL/hr at 12/15/19 1000  . dextrose 50 mL/hr at 31-Dec-2019 1015  . epinephrine 12 mcg/min (2019/12/31 1028)  . feeding supplement (VITAL AF 1.2 CAL) 1,000 mL (12/16/19 1632)  . HYDROmorphone    . lactated ringers Stopped (12/10/19 0738)  . norepinephrine (LEVOPHED) Adult infusion 70 mcg/min (12-31-19 1020)  . vasopressin (PITRESSIN) infusion - *FOR SHOCK* 0.03 Units/min (31-Dec-2019 1000)    PRN Meds: sodium chloride, acetaminophen (TYLENOL) oral liquid 160 mg/5 mL **OR** acetaminophen, Gerhardt's butt cream, heparin, heparin, methadone, naLOXone (NARCAN)  injection, polyethylene glycol, polyvinyl alcohol, sodium chloride flush  Physical Exam Vitals and nursing note reviewed.  Constitutional:      Appearance: He is cachectic. He is ill-appearing.     Interventions: He is intubated.  HENT:     Head: Normocephalic and atraumatic.  Cardiovascular:     Rate and Rhythm: Regular rhythm.     Heart sounds: Murmur present.  Pulmonary:     Effort: No tachypnea, accessory muscle usage or respiratory distress. He is intubated.  Skin:    General: Skin is warm and dry.     Coloration: Skin is pale.  Neurological:     Mental Status: He is easily aroused.     Comments: Intermittently will open eyes to voice.            Vital Signs: BP (!) 135/37   Pulse 84   Temp 98 F (36.7 C) (Axillary)   Resp 16   Ht _0  (1.727 m)   Wt 79.9 kg   SpO2 100%   BMI 26.78 kg/m  SpO2: SpO2: 100 % O2 Device: O2 Device: Ventilator O2 Flow Rate: O2 Flow Rate (L/min): 1 L/min  Intake/output summary:   Intake/Output Summary (Last 24 hours) at 12/31/2019 1045 Last data filed at 12/31/19 1000 Gross per 24 hour  Intake 3316.6 ml  Output 2860 ml  Net 456.6 ml   LBM: Last BM Date: 12/14/19 Baseline Weight: Weight: 82  kg Most recent weight: Weight: 79.9 kg       Palliative Assessment/Data: PPS 20%    Flowsheet Rows     Most Recent Value  Intake Tab  Referral Department  Critical care  Unit at  Time of Referral  ICU  Palliative Care Primary Diagnosis  Cardiac  Palliative Care Type  New Palliative care  Reason for referral  Clarify Goals of Care  Date first seen by Palliative Care  12/10/19  Clinical Assessment  Palliative Performance Scale Score  20%  Psychosocial & Spiritual Assessment  Palliative Care Outcomes  Patient/Family meeting held?  Yes  Who was at the meeting?  patient and wife  Palliative Care Outcomes  Clarified goals of care, Provided psychosocial or spiritual support, ACP counseling assistance      Patient Active Problem List   Diagnosis Date Noted  . Endotracheally intubated   . Shock ([place]) 12/10/2019  . Palliative care by specialist   . Acute respiratory failure ([place])   . Goals of care, counseling/discussion   . AKI (acute kidney injury) ([place]) 11/26/2019  . Thrombocytopenia ([place]) 11/25/2019  . Bronchiectasis ([place]) 11/25/2019  . DJD (degenerative joint disease) 11/25/2019  . Elevated IgE level 11/25/2019  . Type 2 diabetes mellitus with peripheral vascular disease ([place]) 09/11/2019  . Chronic diastolic heart failure ([place]) 09/04/2019  . Pressure injury of skin 09/04/2019  . Pacemaker   . Aortic valve stenosis   . Heart block AV third degree ([place])   . Opioid dependence on maintenance agonist therapy, no symptoms ([place]) 07/01/2019  . Elevated troponin 07/01/2019  . LBBB (left bundle branch block) 07/01/2019  . Current chronic use of systemic steroids 07/01/2019  . Loculated pleural effusion 09/09/2016  . S/P BKA (below knee amputation) unilateral, left ([place]) 08/09/2016  . Status post amputation of toe of left foot ([place]) 04/27/2016  . Systolic heart failure ([place]) 07/09/2015  . Prolonged QT interval 08/14/2014  . Gait disorder 08/14/2014  . Seizure ([place]) 08/13/2014  .  Normocytic anemia 08/13/2014  . Altered mental status 06/18/2014  . Closed dislocation of metatarsophalangeal (joint) 03/26/2013  . DM (diabetes mellitus) type II controlled with renal manifestation ([place]) 12/11/2012  . Hyperlipidemia 12/11/2012  . Coronary artery disease   . Rheumatoid arthritis ([place])   . Hypertension     Palliative Care Assessment & Plan   Patient Profile: 69 y.o. 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 Extubated 12/13, CRRT stopped 12/14. Being treated with antibiotics for pseudomonas and MRSA tracheobronchitis vs. Pneumonia. Shock respiratory failure/liver improved. Patient's kidney function worsening on 12/15. Nephrology following. Plan for Lasix 66m IV today and concern that he may need intermittent hemodialysis. Patient with severe aortic stenosis. Cardiology following. TAVR team evaluated and given patient's multiple co-morbidities, he is not felt to be a candidate for TAVR. Recommending palliative medicine consultation for goals of care. PEA arrest overnight 12/21. Patient intubated/sedated requiring pressors and CRRT. Poor prognosis  Assessment: Septic shock Pseudomonas and MRSA tracheobronchitis vs pneumonia Severe aortic stenosis (not a candidate for TAVR) AKI (likely multifactorial from ATN and cardiorenal) Acute respiratory failure requiring mechanical ventilation Severe protein calorie malnutrition Dysphagia Shock liver RA Anemia of chronic disease Thrombocytopenia DM with hyperglycemia Hx of CAD, CABG Hx of left BKA  Recommendations/Plan:  After discussions with multiple providers, wife and children understand diagnoses and poor prognosis.  Educated on compassionate extubation and transition to comfort measures  only, understanding poor prognosis and anticipation of hospital death.   Family consensus they do not wish to see him suffer. Discontinue Fentanyl gtt.  Start Dilaudid infusion with prn dilaudid bolus via infusion.   PMT provider will allow family more time before compassionate extubation. Medically recommended today in order to not prolong his pain and suffering.    Spiritual care consult for EOL care.    ADDENDUM: Compassionate extubation to comfort measures only, anticipating hospital death. Continue dilaudid infusion. RN may bolus dilaudid via infusion 1-981m q3681m prn pain/dyspnea/air hunger/tachypnea. Ativan 81m19mV q4h scheduled. Continue prn ativan and robinul. Appreciate spiritual care support.   Code Status: DNR    Code Status Orders  (From admission, onward)         Start     Ordered   12/06/19 0311  Full code  Continuous     12/06/19 0310        Code Status History    Date Active Date Inactive Code Status Order ID Comments User Context   10/27/2019 1812 10/28/2019 2044 Full Code [id number] CurtD Inpatient   09/11/2019 2350 09/15/2019 2027 Full Code [id number] PatienceD ED   08/29/2019 1617 09/04/2019 2130 Full Code [id number] ButtsPA-C ED   07/01/2019 1943 07/03/2019 1917 Full Code [id number] QuillO ED   10/15/2016 0744 10/23/2016 0312 Full Code [id number] HillockD Inpatient   09/02/2016 1918 09/05/2016 1503 Full Code [id number] BullsD ED   08/26/2016 1210 08/30/2016 1853 Full Code [id number],Orvan FalconerD Inpatient   08/13/2014 0336 08/14/2014 1744 Full Code [id number] MainlandO Inpatient   12/11/2012 0312 12/11/2012 2058 Full Code [id number] AgeeD Inpatient   Advance Care Planning Activity       Prognosis:   Poor prognosis likely hours once life-prolonging interventions are discontinued.   Discharge Planning:  Anticipated Hospital Death once extubated to  comfort  Care plan was discussed with wife, son/daughter, RN, Dr. MarJena Gauss  Thank you for allowing the Palliative Medicine Team to assist in the care of this patient.   Time In: 1000 1200- 1440- 1600- Time Out: [id number] [id number] Time 120 Prolonged Time Billed yes      Greater than 50%  of this time was spent counseling and coordinating care related to the above assessment and plan.  MegIhor DowNP, FNP-C Palliative Medicine Team  Phone: [phone]: [phone] contact Palliative Medicine Team phone at [phone] questions and concerns.

## 2019-12-27 NOTE — Progress Notes (Signed)
Called [facility] and notified them pt's BP has been trending down.   A line - currently reads 99/37 (55)  Levo is maxed out at 70 mcg   Restarted vasopressin at .03 units  Also had to restart Epi at 1 mcg  Will call nephro and update them.

## 2019-12-27 NOTE — Progress Notes (Signed)
Patient compassionately extubated to comfort measures. Comfortable on dilaudid infusion. Asystole shortly after extubation. Pronounced with bedside RN at 1715.   NO CHARGE  Ihor Dow, [place], FNP-C Palliative Medicine Team  Phone: [phone] Fax: [phone]

## 2019-12-27 NOTE — Progress Notes (Signed)
Chaplain met with Corney and family. Chaplain provided a prayer shawl and  Prayer rocks. Chaplain prayed with the family and left them to spend time with Barnabas Lister. Nurse will page when family is prepared to transition to comfort care. Chaplains remain available for support as needs arise.   Chaplain Resident, Evelene Croon, [place] [phone] on-call [phone] personal

## 2019-12-27 NOTE — Progress Notes (Signed)
At shift change while getting report, filter began to clot  At 2015, went ahead and changed filter set... Restarted CRRT around 2045.

## 2019-12-27 NOTE — Progress Notes (Signed)
Chaplain engaged in initial visit with [name] and his family at his bedside.  Chaplain provided support with nurse to family as they surrounded [name].  Chaplain was able to learn more about [name] through Mrs. Guard, and walk family out once they were ready.

## 2019-12-27 NOTE — Progress Notes (Signed)
[facility] NEPHROLOGY PROGRESS NOTE  Assessment/ Plan: Pt is a 69 y.o. yo male with history of RA, bronchiectasis, severe AS admitted from [facility] with shock, multiorgan failure and AKI.  #AKI, oliguric: Suspect ATN from sepsis concomitant with acute cardiorenal syndrome/severe AS.  Refractory to IV diuretics. CRRT DC'd on 12/14 and tried intermittent HD on 12/16.  Patient did not tolerate IHD.  Resumed CRRT on 12/18.  Patient had cardiac arrest and persistent hypotension.  He requires multiple pressors and really not tolerating CRRT.   Since we can't take off any fluid and lab parameters are acceptable, I will discontinue CRRT today.  He is DNR and has has poor prognosis.  I have discussed with his wife.  #Acute shock likely cardiogenic and sepsis: On multiple pressors.  Blood pressure still low.  #Acute respiratory failure, history of bronchiectasis: Reintubated.  #Severe AS, cardiac arrest: He had a cardiac arrest on 12/20.  #Hyponatremia, hypervolemic: Improved with CRRT.  #Anemia of critical illness: Receiving blood transfusion.  Iron sat 31%.   #Hypophosphatemia: Repleted K-Phos.  Improved  Discussed with ICU nurse.  Subjective: Event noted.  Filter clotted overnight.  Blood pressure low on pressors.  Running CRRT even.  Patient's wife at bedside. Objective Vital signs in last 24 hours: Vitals:   01-03-20 0700 01/03/2020 0800 03-Jan-2020 0815 2020-01-03 0900  BP: (!) 84/53 (!) 69/39  (!) 124/48  Pulse: 78 79  82  Resp: 18 (!) 23  15  Temp:    (!) 97.4 F (36.3 C)  TempSrc:    Axillary  SpO2: 100% 100% 100% 100%  Weight:      Height:       Weight change: -2.1 kg  Intake/Output Summary (Last 24 hours) at 01-03-2020 0952 Last data filed at 03-Jan-2020 0900 Gross per 24 hour  Intake 3082.71 ml  Output 3018 ml  Net 64.71 ml       Labs: Basic Metabolic Panel: Recent Labs  Lab 12/16/19 0405 12/16/19 1800 Jan 03, 2020 0418  NA 134* 134* 134*  K 5.6* 4.1 4.0   CL 103 103 105  CO2 21* 22 16*  GLUCOSE 198* 143* 83  BUN 40* 30* 24*  CREATININE 1.16 0.97 0.84  CALCIUM 7.4* 7.5* 7.5*  PHOS 4.2 3.0 3.5   Liver Function Tests: Recent Labs  Lab 12/12/19 0338 12/15/19 0322 12/15/19 1930 12/16/19 0405 12/16/19 1800 01-03-20 0418  AST 42* 32 42*  --   --   --   ALT 93* 44 41  --   --   --   ALKPHOS 107 82 75  --   --   --   BILITOT 2.9* 2.6* 2.3*  --   --   --   PROT 5.9* 4.3* 3.5*  --   --   --   ALBUMIN 3.2* 1.9* 1.5* 1.8* 1.8* 1.8*   No results for input(s): LIPASE, AMYLASE in the last 168 hours. No results for input(s): AMMONIA in the last 168 hours. CBC: Recent Labs  Lab 12/15/19 0322 12/15/19 1930 12/15/19 2054 12/15/19 2056 12/16/19 0405 01/03/2020 0418  WBC 19.8* 23.4* 23.0*  --  20.0* 28.3*  HGB 7.8* 5.9* 6.3* 6.5* 8.5* 8.6*  HCT 24.0* 19.9* 20.8* 19.0* 26.8* 27.0*  MCV 95.6 105.3* 101.0*  --  95.0 96.8  PLT 77* 62* 71*  --  62* 30*   Cardiac Enzymes: No results for input(s): CKTOTAL, CKMB, CKMBINDEX, TROPONINI in the last 168 hours. CBG: Recent Labs  Lab 12/16/19 1639 12/16/19 2033  12/29/19 0022 29-Dec-2019 0422 12-29-2019 0757  GLUCAP 168* 127* 108* 81 54*    Iron Studies:  No results for input(s): IRON, TIBC, TRANSFERRIN, FERRITIN in the last 72 hours. Studies/Results: DG CHEST PORT 1 VIEW  Result Date: December 29, 2019 CLINICAL DATA:  Hypoxia EXAM: PORTABLE CHEST 1 VIEW COMPARISON:  December 16, 2019. FINDINGS: Endotracheal tube tip is 3.5 cm above the carina. Nasogastric tube tip and side port are below the diaphragm. Central catheter tip is in the superior vena cava. Pacemaker leads are attached the right atrium and right ventricle. No pneumothorax. There is atelectatic change in the right base. The lungs elsewhere are clear. Heart size and pulmonary vascularity are normal. Patient is status post coronary artery bypass grafting. No adenopathy. There is advanced arthropathy in each shoulder, stable. There is  calcification in each carotid artery. IMPRESSION: Tube and catheter positions as described without pneumothorax. Right base atelectasis. Lungs elsewhere clear. Stable cardiac silhouette. Advanced arthropathy in each shoulder. Bilateral carotid artery calcification noted. Electronically Signed   By: Lowella Grip III M.D.   On: 12/29/19 07:38   DG CHEST PORT 1 VIEW  Result Date: 12/16/2019 CLINICAL DATA:  Central line placement EXAM: PORTABLE CHEST 1 VIEW COMPARISON:  December 15, 2019 FINDINGS: The endotracheal tube terminates above the carina. The right-sided central venous catheter is well position with tip terminating near the SVC. The new left-sided central venous catheter tip projects over the proximal SVC. There is no left-sided pneumothorax. There is a multi lead left-sided pacemaker in place. The lung volumes are low. Bibasilar airspace opacities are again noted, right worse than left. There is elevation of the right hemidiaphragm. There is no right-sided pneumothorax. IMPRESSION: 1. Lines and tubes as above.  No pneumothorax. 2. Otherwise, no significant interval change. Electronically Signed   By: Constance Holster M.D.   On: 12/16/2019 01:29   DG CHEST PORT 1 VIEW  Result Date: 12/15/2019 CLINICAL DATA:  Evaluate ETT placement EXAM: PORTABLE CHEST 1 VIEW COMPARISON:  December 15, 2019 FINDINGS: The ETT is in good position. Pacemaker is identified. A right central line terminates in the central SVC. No pneumothorax. No nodules or masses. Mild atelectasis in the right base is stable. No other interval changes. IMPRESSION: 1. Support apparatus as above. 2. Mild right basilar atelectasis.  No other change. Electronically Signed   By: Dorise Bullion III M.D   On: 12/15/2019 20:16   DG Abd Portable 1V  Result Date: 12/15/2019 CLINICAL DATA:  NG tube placement. EXAM: PORTABLE ABDOMEN - 1 VIEW COMPARISON:  Chest radiograph earlier this day. FINDINGS: Enteric tube in place with tip and  side-port below the diaphragm in the stomach. Endotracheal tube, central line, and pacemaker partially included. Air in stool throughout colon in the upper abdomen in a nonobstructive pattern. IMPRESSION: Enteric tube in place with tip and side-port below the diaphragm in the stomach. Electronically Signed   By: Keith Rake M.D.   On: 12/15/2019 23:23   ECHOCARDIOGRAM COMPLETE  Result Date: 12/16/2019   ECHOCARDIOGRAM REPORT   Patient Name:   DIRK VANAMAN [place] Date of Exam: 12/16/2019 Medical Rec #:  [id number]    Height:       68.0 in Accession #:    [id number]   Weight:       180.8 lb Date of Birth:  1951/09/01    BSA:          1.96 m Patient Age:    70 years     BP:  119/44 mmHg Patient Gender: M            HR:           75 bpm. Exam Location:  Inpatient Procedure: 2D Echo and Intracardiac Opacification Agent Indications:    Aortic Stenosis 424.1 / 135.0  History:        Patient has prior history of Echocardiogram examinations, most                 recent 12/09/2019. CAD, Pacemaker, Arrythmias:LBBB; Risk                 Factors:Dyslipidemia and Diabetes. Shock                 Acute respiratory failure                 Heart block AV third degree.  Sonographer:    Vikki Ports Turrentine Referring Phys: [id number] Francesca Jewett  Sonographer Comments: Suboptimal subcostal window. IMPRESSIONS  1. Left ventricular ejection fraction, by visual estimation, is 25 to 30%. The left ventricle has severely decreased function. There is no left ventricular hypertrophy.  2. Definity contrast agent was given IV to delineate the left ventricular endocardial borders.  3. Left ventricular diastolic parameters are consistent with Grade I diastolic dysfunction (impaired relaxation).  4. The left ventricle demonstrates global hypokinesis.  5. Global right ventricle has normal systolic function.The right ventricular size is normal. No increase in right ventricular wall thickness.  6. Left atrial size was normal.  7. Right atrial  size was normal.  8. Moderate mitral annular calcification.  9. The mitral valve is normal in structure. Mild mitral valve regurgitation. No evidence of mitral stenosis. 10. The tricuspid valve is normal in structure. Tricuspid valve regurgitation is not demonstrated. 11. Aortic valve regurgitation is severe. 12. The aortic valve is normal in structure. Aortic valve regurgitation is severe. Moderate to severe aortic valve stenosis. 13. There is severe calcifcation of the aortic valve. 14. There is severe thickening of the aortic valve. 15. Peak aortic valve velocity: 3.1ms, mean gradient 353mg (likely decreased in the setting of decreased EF, underestimating severity of AS). 16. The pulmonic valve was normal in structure. Pulmonic valve regurgitation is not visualized. 17. The inferior vena cava is normal in size with greater than 50% respiratory variability, suggesting right atrial pressure of 3 mmHg. In comparison to the previous echocardiogram(s): When compared to prior, transaortic velocity is decreased (4.55m27m likely as a result of decreased EF. FINDINGS  Left Ventricle: Left ventricular ejection fraction, by visual estimation, is 25 to 30%. The left ventricle has severely decreased function. Definity contrast agent was given IV to delineate the left ventricular endocardial borders. The left ventricle demonstrates global hypokinesis. There is no left ventricular hypertrophy. Left ventricular diastolic parameters are consistent with Grade I diastolic dysfunction (impaired relaxation). Normal left atrial pressure. Right Ventricle: The right ventricular size is normal. No increase in right ventricular wall thickness. Global RV systolic function is has normal systolic function. Left Atrium: Left atrial size was normal in size. Right Atrium: Right atrial size was normal in size Pericardium: There is no evidence of pericardial effusion. Mitral Valve: The mitral valve is normal in structure. Moderate mitral  annular calcification. Mild mitral valve regurgitation. No evidence of mitral valve stenosis by observation. Tricuspid Valve: The tricuspid valve is normal in structure. Tricuspid valve regurgitation is not demonstrated. Aortic Valve: The aortic valve is normal in structure.. There is severe thickening and severe calcifcation of  the aortic valve. Aortic valve regurgitation is severe. Aortic regurgitation PHT measures 413 msec. Moderate to severe aortic stenosis is present. There is severe thickening of the aortic valve. There is severe calcifcation of the aortic valve. Aortic valve mean gradient measures 29.2 mmHg. Aortic valve peak gradient measures 49.1 mmHg. Aortic valve area, by VTI measures 0.56 cm. Peak aortic valve velocity: 3.1ms, mean gradient 347mg (likely decreased in the setting of decreased EF, underestimating severity of AS). Pulmonic Valve: The pulmonic valve was normal in structure. Pulmonic valve regurgitation is not visualized. Pulmonic regurgitation is not visualized. Aorta: The aortic root, ascending aorta and aortic arch are all structurally normal, with no evidence of dilitation or obstruction. Venous: The inferior vena cava is normal in size with greater than 50% respiratory variability, suggesting right atrial pressure of 3 mmHg. IAS/Shunts: No atrial level shunt detected by color flow Doppler. There is no evidence of a patent foramen ovale. No ventricular septal defect is seen or detected. There is no evidence of an atrial septal defect.  LEFT VENTRICLE PLAX 2D LVIDd:         5.26 cm LVIDs:         4.20 cm LV PW:         0.94 cm LV IVS:        0.84 cm LVOT diam:     1.80 cm LV SV:         54 ml LV SV Index:   27.20 LVOT Area:     2.54 cm  LEFT ATRIUM             Index LA diam:        4.70 cm 2.40 cm/m LA Vol (A2C):   70.3 ml 35.91 ml/m LA Vol (A4C):   41.2 ml 21.04 ml/m LA Biplane Vol: 54.2 ml 27.68 ml/m  AORTIC VALVE AV Area (Vmax):    0.42 cm AV Area (Vmean):   0.39 cm AV Area  (VTI):     0.56 cm AV Vmax:           350.20 cm/s AV Vmean:          258.200 cm/s AV VTI:            0.581 m AV Peak Grad:      49.1 mmHg AV Mean Grad:      29.2 mmHg LVOT Vmax:         58.10 cm/s LVOT Vmean:        40.000 cm/s LVOT VTI:          0.128 m LVOT/AV VTI ratio: 0.22 AI PHT:            413 msec  AORTA Ao Root diam: 3.00 cm MITRAL VALVE MV Area (PHT): 3.91 cm             SHUNTS MV PHT:        56.26 msec           Systemic VTI:  0.13 m MV Decel Time: 194 msec             Systemic Diam: 1.80 cm MV E velocity: 55.30 cm/s 103 cm/s MV A velocity: 81.80 cm/s 70.3 cm/s MV E/A ratio:  0.68       1.5  MaCandee FurbishD Electronically signed by MaCandee FurbishD Signature Date/Time: 12/16/2019/11:23:13 AM    Final     Medications: Infusions: . sodium chloride 10 mL/hr at 12/15/19 1000  . dextrose    . epinephrine  16 mcg/min (2020/01/03 0900)  . feeding supplement (VITAL AF 1.2 CAL) 1,000 mL (12/16/19 1632)  . fentaNYL infusion INTRAVENOUS 50 mcg/hr (01/03/2020 0900)  . lactated ringers Stopped (12/10/19 0738)  . norepinephrine (LEVOPHED) Adult infusion 70 mcg/min (01/03/2020 0900)  . vasopressin (PITRESSIN) infusion - *FOR SHOCK* 0.03 Units/min (03-Jan-2020 0900)    Scheduled Medications: . chlorhexidine  15 mL Mouth Rinse BID  . chlorhexidine gluconate (MEDLINE KIT)  15 mL Mouth Rinse BID  . Chlorhexidine Gluconate Cloth  6 each Topical Q2200  . feeding supplement (PRO-STAT SUGAR FREE 64)  30 mL Per Tube QID  . hydrocortisone sod succinate (SOLU-CORTEF) inj  50 mg Intravenous Q6H  . hydroxychloroquine  200 mg Oral Daily  . insulin aspart  0-9 Units Subcutaneous Q4H  . mouth rinse  15 mL Mouth Rinse 10 times per day  . methadone  10 mg Oral Q6H  . midodrine  10 mg Oral TID WC  . multivitamin with minerals  1 tablet Oral Daily  . pantoprazole (PROTONIX) IV  40 mg Intravenous Q12H  . sodium chloride flush  10-40 mL Intracatheter Q12H    have reviewed scheduled and prn medications.  Physical Exam:  Unchanged General: Intubated, sedated Heart:RRR, s1s2 nl, no rubs Lungs: Coarse breath sound bilateral Abdomen:soft, Non-tender, non-distended Extremities: LE edema + Dialysis Access: Right IJ temporary catheter placed on 12/12.  Thersea Manfredonia Prasad Metzli Pollick 03-Jan-2020,9:52 AM  LOS: 11 days  Pager: [phone]

## 2019-12-27 NOTE — Progress Notes (Signed)
RT NOTE: RT extubated patient to comfort per MD order and family request. RN, NP and family at bedside.

## 2019-12-27 NOTE — Death Summary Note (Signed)
DEATH SUMMARY   Patient Details  Name: [name] MRN: [id number] DOB: [birth year]  Admission/Discharge Information   Admit Date:  12/14/2019  Date of Death: Date of Death: 12-26-2019  Time of Death: Time of Death: Mar 26, 7114  Length of Stay: 2023/03/15  Referring Physician: [name], MD   Reason(s) for Hospitalization  acute resp failure  Diagnoses  Preliminary cause of death: Shock [facility]) Secondary Diagnoses (including complications and co-morbidities):  Active Problems:   Coronary artery disease   Rheumatoid arthritis (HCC)   DM (diabetes mellitus) type II controlled with renal manifestation (HCC)   Hyperlipidemia   S/P BKA (below knee amputation) unilateral, left (HCC)   Altered mental status   LBBB (left bundle branch block)   Pacemaker   Aortic valve stenosis   Chronic diastolic heart failure (HCC)   Type 2 diabetes mellitus with peripheral vascular disease ([place])   Acute renal failure ([place])   Terminal care   Shock ([place])   Palliative care by specialist   Acute respiratory failure ([place])   Endotracheally intubated   Other chronic pain   Anxiety state   Brief Hospital Course (including significant findings, care, treatment, and services provided and events leading to death)  [name] is a 69 y.o. year old male who has hx of bronchiectasis with recent lower lobe PNA secondary to Candida tropicalis vs colonization.  Patient was doing well but became progressively confused over the last 48 hours, brought to AP septic, now intubated and on pressors.  Has a history of severe AS with EF 40%, prior CKD, Cr 11/2 1.21 ( CR now 4.1) and RA.  He is now anuric and mildly hyponatremic.  Potassium is 5.4. 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  Patient has elevated liver function tests lactic acid of 3.8, platelet count of 121, INR  3.1.  On 12/12 pt decompensated req intubation pressors and renal consult CRRT was initiated. He initially improved allowing crrt to stop and be exutbated. however  He developed worsening sepsis on broad spectrum antibiotics initated on 12/17 and progressed req escalating pressors. On the vening of 12/20 pt had cardiac arrest and was reintubated. Pressors escalated to epi, vaso levo. He was started on stress steroids and cont on broad abx.   Thankfully palliative care had been following alongwith pt and his family and on 12/26/2023 due to pt's cont deterioration decision was made to compassionately extubate pt. He expired at 1715 with family at his side.    Pertinent Labs and Studies  Significant Diagnostic Studies CT ABDOMEN PELVIS WO CONTRAST  Result Date: 2019/12/14 CLINICAL DATA:  Nausea and vomiting. EXAM: CT ABDOMEN AND PELVIS WITHOUT CONTRAST TECHNIQUE: Multidetector CT imaging of the abdomen and pelvis was performed following the standard protocol without IV contrast. COMPARISON:  Abdominal CTA 10/28/2019 FINDINGS: Lower chest: Multi chamber cardiomegaly. Dense coronary artery calcifications. Small pleural effusions with irregular consolidative and ground-glass opacities in both lower lobes. Hepatobiliary: Small volume of perihepatic ascites is new from prior CT. No gross focal hepatic lesion allowing for lack contrast and streak artifact from arms down positioning. Layering hyperdensity in the gallbladder likely combination of stones and sludge. Motion limits detailed assessment for pericholecystic stranding. No biliary dilatation. Pancreas: Diffuse fatty atrophy. No ductal dilatation or inflammation. Spleen: Normal in size without focal abnormality. Adrenals/Urinary Tract: Normal adrenal glands. No hydronephrosis. Mild thinning of bilateral  renal parenchyma with lobulated renal contours. 15 mm lesion from the interpolar left kidney unchanged from prior exam. Advanced renal vascular calcifications.  Urinary bladder is partially distended. Left aspect of the bladder is obscured by streak artifact from left hip arthroplasty. Stomach/Bowel: Air-fluid level in the stomach which is mildly distended. No gastric or duodenal wall thickening. No small bowel dilatation or obstruction. Appendix is tentatively visualized and normal. No evidence of appendicitis. Large stool burden in the ascending transverse, and proximal descending colon. More distal descending colon is air-filled distal colon is decompressed. There is diffuse colonic tortuosity. No evidence of colonic wall thickening or inflammation. Vascular/Lymphatic: Advanced vascular disease. Advanced aortic and branch atherosclerosis. No aortic aneurysm. No bulky abdominopelvic adenopathy. Reproductive: No acute findings. Borderline enlarged prostate gland. Other: Small volume of ascites in the pelvis, both pericolic gutters, and right upper quadrant. No free air. No loculated intra-abdominal fluid collection. Mild body wall edema, confluent in the flanks. Small fat containing umbilical hernia. Fat in the left greater than right inguinal canal. Musculoskeletal: Multilevel degenerative change in the spine. Similar appearance of degenerative disc disease at L3-L4. There are no acute or suspicious osseous abnormalities. Left hip arthroplasty. IMPRESSION: 1. Mild gastric distension with air-fluid level, can be seen with gastroenteritis or gastroparesis. No gastric wall thickening or evidence of gastric outlet obstruction. 2. Small volume of ascites in the abdomen and pelvis, new from prior CT. Mild body wall edema. 3. Large stool burden with colonic tortuosity, suggesting constipation. No bowel obstruction. 4. Bilateral pleural effusions. Irregular streaky consolidative airspace opacities in both lower lobes, increased from CT last month. Pneumonia or aspiration considered. 5. Severe aortic and branch atherosclerosis. Aortic Atherosclerosis (ICD10-I70.0). Electronically  Signed   By: Keith Rake M.D.   On: 12/10/2019 23:58   CT Head Wo Contrast  Result Date: 11/29/2019 CLINICAL DATA:  Encephalopathy EXAM: CT HEAD WITHOUT CONTRAST TECHNIQUE: Contiguous axial images were obtained from the base of the skull through the vertex without intravenous contrast. COMPARISON:  None. FINDINGS: Brain: There is no mass, hemorrhage or extra-axial collection. The size and configuration of the ventricles and extra-axial CSF spaces are normal. There is hypoattenuation of the white matter, most commonly indicating chronic small vessel disease. Vascular: Atherosclerotic calcification of the vertebral and internal carotid arteries at the skull base. No abnormal hyperdensity of the major intracranial arteries or dural venous sinuses. Skull: The visualized skull base, calvarium and extracranial soft tissues are normal. Sinuses/Orbits: No fluid levels or advanced mucosal thickening of the visualized paranasal sinuses. No mastoid or middle ear effusion. The orbits are normal. IMPRESSION: Findings of chronic small vessel disease without acute intracranial abnormality. Electronically Signed   By: Ulyses Jarred M.D.   On: 11/28/2019 19:28   DG CHEST PORT 1 VIEW  Result Date: 12/22/19 CLINICAL DATA:  Hypoxia EXAM: PORTABLE CHEST 1 VIEW COMPARISON:  December 16, 2019. FINDINGS: Endotracheal tube tip is 3.5 cm above the carina. Nasogastric tube tip and side port are below the diaphragm. Central catheter tip is in the superior vena cava. Pacemaker leads are attached the right atrium and right ventricle. No pneumothorax. There is atelectatic change in the right base. The lungs elsewhere are clear. Heart size and pulmonary vascularity are normal. Patient is status post coronary artery bypass grafting. No adenopathy. There is advanced arthropathy in each shoulder, stable. There is calcification in each carotid artery. IMPRESSION: Tube and catheter positions as described without pneumothorax. Right  base atelectasis. Lungs elsewhere clear. Stable cardiac silhouette. Advanced arthropathy in  each shoulder. Bilateral carotid artery calcification noted. Electronically Signed   By: Lowella Grip III M.D.   On: 22-Dec-2019 07:38   DG CHEST PORT 1 VIEW  Result Date: 12/16/2019 CLINICAL DATA:  Central line placement EXAM: PORTABLE CHEST 1 VIEW COMPARISON:  December 15, 2019 FINDINGS: The endotracheal tube terminates above the carina. The right-sided central venous catheter is well position with tip terminating near the SVC. The new left-sided central venous catheter tip projects over the proximal SVC. There is no left-sided pneumothorax. There is a multi lead left-sided pacemaker in place. The lung volumes are low. Bibasilar airspace opacities are again noted, right worse than left. There is elevation of the right hemidiaphragm. There is no right-sided pneumothorax. IMPRESSION: 1. Lines and tubes as above.  No pneumothorax. 2. Otherwise, no significant interval change. Electronically Signed   By: Constance Holster M.D.   On: 12/16/2019 01:29   DG CHEST PORT 1 VIEW  Result Date: 12/15/2019 CLINICAL DATA:  Evaluate ETT placement EXAM: PORTABLE CHEST 1 VIEW COMPARISON:  December 15, 2019 FINDINGS: The ETT is in good position. Pacemaker is identified. A right central line terminates in the central SVC. No pneumothorax. No nodules or masses. Mild atelectasis in the right base is stable. No other interval changes. IMPRESSION: 1. Support apparatus as above. 2. Mild right basilar atelectasis.  No other change. Electronically Signed   By: Dorise Bullion III M.D   On: 12/15/2019 20:16   DG CHEST PORT 1 VIEW  Result Date: 12/15/2019 CLINICAL DATA:  Rhonchi at the right lung base. EXAM: PORTABLE CHEST 1 VIEW COMPARISON:  December 09, 2019 FINDINGS: The heart size remains mildly enlarged. The patient is status post prior median sternotomy. There is a right-sided central venous catheter in place with no evidence  for pneumothorax. The tip projects over the SVC. There are persistent low lung volumes. There is a right basilar airspace opacity favored to represent atelectasis with an infiltrate not excluded. There are small bilateral pleural effusions, right greater than left. IMPRESSION: 1. Lines and tubes as above. 2. No significant interval change. Persistent bibasilar airspace opacities, right worse than left with small bilateral pleural effusions. Electronically Signed   By: Constance Holster M.D.   On: 12/15/2019 03:42   DG Chest Port 1 View  Result Date: 12/09/2019 CLINICAL DATA:  69 year old male with weakness and respiratory failure negative for COVID-19. EXAM: PORTABLE CHEST 1 VIEW COMPARISON:  12/08/2019 and earlier. FINDINGS: Portable AP semi upright view at 0543 hours. Extubated. Enteric tube removed. Stable right IJ central line. Stable left chest cardiac pacemaker. Stable lung volumes and mediastinal contours. Mildly improved bibasilar ventilation with residual patchy opacity. No pneumothorax, pulmonary edema or pleural effusion. Negative visible bowel gas pattern. IMPRESSION: 1. Extubated and enteric tube removed. 2. Stable lung volumes with improved ventilation, decreased atelectasis. Electronically Signed   By: Genevie Ann M.D.   On: 12/09/2019 08:11   DG CHEST PORT 1 VIEW  Result Date: 12/08/2019 CLINICAL DATA:  Follow-up for respiratory failure. EXAM: PORTABLE CHEST 1 VIEW COMPARISON:  12/07/2019 and older studies. FINDINGS: Right basilar atelectasis.  Remainder of the lungs is clear. No mediastinal widening. No pneumothorax or convincing pleural effusion. Endotracheal tube, right internal jugular central venous line and nasal/orogastric tube as well as the left anterior chest wall pacemaker are stable. IMPRESSION: 1. No change from the previous day's study. 2. Persistent mild right lung base atelectasis. No new lung abnormalities. 3. Stable support apparatus. Electronically Signed   By: Shanon Brow  Ormond  M.D.   On: 12/08/2019 10:57   DG CHEST PORT 1 VIEW  Result Date: 12/07/2019 CLINICAL DATA:  Status post insertion of new catheter. EXAM: PORTABLE CHEST 1 VIEW COMPARISON:  December 06, 2019. FINDINGS: Stable cardiomegaly. Endotracheal and nasogastric tubes are unchanged in position. Interval placement of right internal jugular catheter with distal tip in expected position of SVC. Left-sided pacemaker is unchanged in position. No pneumothorax pleural effusion is noted. Mild right basilar atelectasis or infiltrate is noted. Bony thorax is unremarkable. IMPRESSION: Interval placement of right internal jugular catheter with distal tip in expected position of SVC. No pneumothorax is noted. Mild right basilar atelectasis or infiltrate is noted. Electronically Signed   By: Marijo Conception M.D.   On: 12/07/2019 14:03   DG Chest Port 1 View  Result Date: 12/06/2019 CLINICAL DATA:  Hypoxia. EXAM: PORTABLE CHEST 1 VIEW COMPARISON:  Chest x-ray and abdominal CT scan dated 11/27/2019 FINDINGS: There overall heart size and pulmonary vascularity are normal. The left lung is now clear. Persistent atelectasis at the right lung base. Increased density posterior medially at the right base is consistent with the infiltrate seen on the prior CT scan. Slight gaseous distention of the stomach, unchanged. No acute bone abnormality. Prominent arthritic changes at the shoulders, right greater than left. IMPRESSION: 1. Increased density posterior medially at the right lung base consistent with the infiltrate seen on the prior CT scan. 2. Persistent atelectasis at the right lung base. 3. Clearing of the left lung. Electronically Signed   By: Lorriane Shire M.D.   On: 12/06/2019 13:26   DG Chest Port 1 View  Result Date: 11/29/2019 CLINICAL DATA:  Chronic cough and shortness of breath EXAM: PORTABLE CHEST 1 VIEW COMPARISON:  September 11, 2019 FINDINGS: Patchy opacities are identified in the left suprahilar region, right lung  base. Elevated right hemidiaphragm is identified, chronic. There is no pleural effusion. Cardiac pacemaker is unchanged. The heart size is enlarged. IMPRESSION: Patchy opacity identified in the left perihilar region and right lung base, pneumonia not excluded. Electronically Signed   By: Abelardo Diesel M.D.   On: 12/11/2019 19:58   DG Chest Port 1V same Day  Result Date: 12/06/2019 CLINICAL DATA:  Status post intubation. EXAM: PORTABLE CHEST 1 VIEW COMPARISON:  Same day. FINDINGS: Stable cardiomegaly. Endotracheal and nasogastric tubes appear to be in grossly good position. Left-sided pacemaker is unchanged. No pneumothorax pleural effusion is noted. Stable right basilar atelectasis or infiltrate is noted. Bony thorax is unremarkable. IMPRESSION: Stable right basilar atelectasis or infiltrate. Support apparatus in grossly good position. Electronically Signed   By: Marijo Conception M.D.   On: 12/06/2019 15:38   DG Abd Portable 1V  Result Date: 12/15/2019 CLINICAL DATA:  NG tube placement. EXAM: PORTABLE ABDOMEN - 1 VIEW COMPARISON:  Chest radiograph earlier this day. FINDINGS: Enteric tube in place with tip and side-port below the diaphragm in the stomach. Endotracheal tube, central line, and pacemaker partially included. Air in stool throughout colon in the upper abdomen in a nonobstructive pattern. IMPRESSION: Enteric tube in place with tip and side-port below the diaphragm in the stomach. Electronically Signed   By: Keith Rake M.D.   On: 12/15/2019 23:23   ECHOCARDIOGRAM COMPLETE  Result Date: 12/16/2019   ECHOCARDIOGRAM REPORT   Patient Name:   [name] Date of Exam: 12/16/2019 Medical Rec #:  [id number]    Height:       68.0 in Accession #:    [id number]  Weight:       180.8 lb Date of Birth:  10/28/1951    BSA:          1.96 m Patient Age:    29 years     BP:           119/44 mmHg Patient Gender: M            HR:           75 bpm. Exam Location:  Inpatient Procedure: 2D Echo and  Intracardiac Opacification Agent Indications:    Aortic Stenosis 424.1 / 135.0  History:        Patient has prior history of Echocardiogram examinations, most                 recent 12/09/2019. CAD, Pacemaker, Arrythmias:LBBB; Risk                 Factors:Dyslipidemia and Diabetes. Shock                 Acute respiratory failure                 Heart block AV third degree.  Sonographer:    Vikki Ports Turrentine Referring Phys: [id number] Francesca Jewett  Sonographer Comments: Suboptimal subcostal window. IMPRESSIONS  1. Left ventricular ejection fraction, by visual estimation, is 25 to 30%. The left ventricle has severely decreased function. There is no left ventricular hypertrophy.  2. Definity contrast agent was given IV to delineate the left ventricular endocardial borders.  3. Left ventricular diastolic parameters are consistent with Grade I diastolic dysfunction (impaired relaxation).  4. The left ventricle demonstrates global hypokinesis.  5. Global right ventricle has normal systolic function.The right ventricular size is normal. No increase in right ventricular wall thickness.  6. Left atrial size was normal.  7. Right atrial size was normal.  8. Moderate mitral annular calcification.  9. The mitral valve is normal in structure. Mild mitral valve regurgitation. No evidence of mitral stenosis. 10. The tricuspid valve is normal in structure. Tricuspid valve regurgitation is not demonstrated. 11. Aortic valve regurgitation is severe. 12. The aortic valve is normal in structure. Aortic valve regurgitation is severe. Moderate to severe aortic valve stenosis. 13. There is severe calcifcation of the aortic valve. 14. There is severe thickening of the aortic valve. 15. Peak aortic valve velocity: 3.68m/s, mean gradient 68mmHg (likely decreased in the setting of decreased EF, underestimating severity of AS). 16. The pulmonic valve was normal in structure. Pulmonic valve regurgitation is not visualized. 17. The inferior  vena cava is normal in size with greater than 50% respiratory variability, suggesting right atrial pressure of 3 mmHg. In comparison to the previous echocardiogram(s): When compared to prior, transaortic velocity is decreased (4.44m/s) likely as a result of decreased EF. FINDINGS  Left Ventricle: Left ventricular ejection fraction, by visual estimation, is 25 to 30%. The left ventricle has severely decreased function. Definity contrast agent was given IV to delineate the left ventricular endocardial borders. The left ventricle demonstrates global hypokinesis. There is no left ventricular hypertrophy. Left ventricular diastolic parameters are consistent with Grade I diastolic dysfunction (impaired relaxation). Normal left atrial pressure. Right Ventricle: The right ventricular size is normal. No increase in right ventricular wall thickness. Global RV systolic function is has normal systolic function. Left Atrium: Left atrial size was normal in size. Right Atrium: Right atrial size was normal in size Pericardium: There is no evidence of pericardial effusion. Mitral Valve: The mitral valve is  normal in structure. Moderate mitral annular calcification. Mild mitral valve regurgitation. No evidence of mitral valve stenosis by observation. Tricuspid Valve: The tricuspid valve is normal in structure. Tricuspid valve regurgitation is not demonstrated. Aortic Valve: The aortic valve is normal in structure.. There is severe thickening and severe calcifcation of the aortic valve. Aortic valve regurgitation is severe. Aortic regurgitation PHT measures 413 msec. Moderate to severe aortic stenosis is present. There is severe thickening of the aortic valve. There is severe calcifcation of the aortic valve. Aortic valve mean gradient measures 29.2 mmHg. Aortic valve peak gradient measures 49.1 mmHg. Aortic valve area, by VTI measures 0.56 cm. Peak aortic valve velocity: 3.34m/s, mean gradient 55mmHg (likely decreased in the setting  of decreased EF, underestimating severity of AS). Pulmonic Valve: The pulmonic valve was normal in structure. Pulmonic valve regurgitation is not visualized. Pulmonic regurgitation is not visualized. Aorta: The aortic root, ascending aorta and aortic arch are all structurally normal, with no evidence of dilitation or obstruction. Venous: The inferior vena cava is normal in size with greater than 50% respiratory variability, suggesting right atrial pressure of 3 mmHg. IAS/Shunts: No atrial level shunt detected by color flow Doppler. There is no evidence of a patent foramen ovale. No ventricular septal defect is seen or detected. There is no evidence of an atrial septal defect.  LEFT VENTRICLE PLAX 2D LVIDd:         5.26 cm LVIDs:         4.20 cm LV PW:         0.94 cm LV IVS:        0.84 cm LVOT diam:     1.80 cm LV SV:         54 ml LV SV Index:   27.20 LVOT Area:     2.54 cm  LEFT ATRIUM             Index LA diam:        4.70 cm 2.40 cm/m LA Vol (A2C):   70.3 ml 35.91 ml/m LA Vol (A4C):   41.2 ml 21.04 ml/m LA Biplane Vol: 54.2 ml 27.68 ml/m  AORTIC VALVE AV Area (Vmax):    0.42 cm AV Area (Vmean):   0.39 cm AV Area (VTI):     0.56 cm AV Vmax:           350.20 cm/s AV Vmean:          258.200 cm/s AV VTI:            0.581 m AV Peak Grad:      49.1 mmHg AV Mean Grad:      29.2 mmHg LVOT Vmax:         58.10 cm/s LVOT Vmean:        40.000 cm/s LVOT VTI:          0.128 m LVOT/AV VTI ratio: 0.22 AI PHT:            413 msec  AORTA Ao Root diam: 3.00 cm MITRAL VALVE MV Area (PHT): 3.91 cm             SHUNTS MV PHT:        56.26 msec           Systemic VTI:  0.13 m MV Decel Time: 194 msec             Systemic Diam: 1.80 cm MV E velocity: 55.30 cm/s 103 cm/s MV A velocity: 81.80 cm/s 70.3 cm/s MV  E/A ratio:  0.68       1.5  Candee Furbish MD Electronically signed by Candee Furbish MD Signature Date/Time: 12/16/2019/11:23:13 AM    Final    ECHOCARDIOGRAM COMPLETE  Result Date: 12/09/2019   ECHOCARDIOGRAM REPORT    Patient Name:   DAVAUGHN HILLYARD Meyn Date of Exam: 12/09/2019 Medical Rec #:  [id number]    Height:       68.0 in Accession #:    [id number]   Weight:       174.4 lb Date of Birth:  September 17, 1951    BSA:          1.93 m Patient Age:    69 years     BP:           83/56 mmHg Patient Gender: M            HR:           86 bpm. Exam Location:  Inpatient Procedure: 2D Echo Indications:    Aortic Stenosis  History:        Patient has prior history of Echocardiogram examinations, most                 recent 11/08/2019. CAD; Risk Factors:Hypertension and Diabetes.  Sonographer:    Mikki Santee RDCS (AE) Referring Phys: [id number] [place]  1. Left ventricular ejection fraction, by visual estimation, is 25 to 30%. The left ventricle has severely decreased function. There is no left ventricular hypertrophy.  2. Left ventricular diastolic parameters are consistent with Grade II diastolic dysfunction (pseudonormalization).  3. Moderately dilated left ventricular internal cavity size.  4. The left ventricle demonstrates global hypokinesis.  5. Compared to echo from 11/08/19 LVEF is worse.  6. Global right ventricle has severely reduced systolic function.The right ventricular size is severely enlarged. No increase in right ventricular wall thickness.  7. Left atrial size was mildly dilated.  8. Right atrial size was normal.  9. The mitral valve is normal in structure. Moderate mitral valve regurgitation. 10. The tricuspid valve is normal in structure. Tricuspid valve regurgitation is mild. 11. The aortic valve is abnormal. Aortic valve regurgitation is not visualized. Severe aortic valve stenosis. 12. Poor acoustic windows limit study AV is calcified with restricted motion that appears to be severe. 13. The pulmonic valve was not well visualized. Pulmonic valve regurgitation is not visualized. FINDINGS  Left Ventricle: Left ventricular ejection fraction, by visual estimation, is 25 to 30%. The left ventricle has severely  decreased function. The left ventricle demonstrates global hypokinesis. The left ventricular internal cavity size was moderately dilated left ventricle. There is no left ventricular hypertrophy. Left ventricular diastolic parameters are consistent with Grade II diastolic dysfunction (pseudonormalization). Compared to echo from 11/08/19 LVEF is worse. Right Ventricle: The right ventricular size is severely enlarged. No increase in right ventricular wall thickness. Global RV systolic function is has severely reduced systolic function. Left Atrium: Left atrial size was mildly dilated. Right Atrium: Right atrial size was normal in size Pericardium: There is no evidence of pericardial effusion. Mitral Valve: The mitral valve is normal in structure. Moderate mitral valve regurgitation. Tricuspid Valve: The tricuspid valve is normal in structure. Tricuspid valve regurgitation is mild. Aortic Valve: The aortic valve is abnormal. Aortic valve regurgitation is not visualized. Aortic regurgitation PHT measures 231 msec. Severe aortic stenosis is present. Aortic valve mean gradient measures 34.0 mmHg. Aortic valve peak gradient measures 57.8 mmHg. Aortic valve area, by VTI measures 0.74 cm. Poor acoustic windows  limit study AV is calcified with restricted motion that appears to be severe. Pulmonic Valve: The pulmonic valve was not well visualized. Pulmonic valve regurgitation is not visualized. Pulmonic regurgitation is not visualized. Aorta: The aortic root is normal in size and structure. IAS/Shunts: No atrial level shunt detected by color flow Doppler.  LEFT VENTRICLE PLAX 2D LVIDd:         5.70 cm  Diastology LVIDs:         4.90 cm  LV e' lateral:   5.76 cm/s LV PW:         0.90 cm  LV E/e' lateral: 21.0 LV IVS:        0.70 cm  LV e' medial:    3.89 cm/s LVOT diam:     2.00 cm  LV E/e' medial:  31.1 LV SV:         47 ml LV SV Index:   24.07 LVOT Area:     3.14 cm  RIGHT VENTRICLE RV S prime:     5.27 cm/s TAPSE (M-mode):  0.6 cm LEFT ATRIUM              Index       RIGHT ATRIUM           Index LA diam:        4.30 cm  2.23 cm/m  RA Area:     19.30 cm LA Vol (A2C):   121.0 ml 62.76 ml/m RA Volume:   52.90 ml  27.44 ml/m LA Vol (A4C):   73.0 ml  37.86 ml/m LA Biplane Vol: 94.2 ml  48.86 ml/m  AORTIC VALVE AV Area (Vmax):    0.73 cm AV Area (Vmean):   0.75 cm AV Area (VTI):     0.74 cm AV Vmax:           380.00 cm/s AV Vmean:          276.500 cm/s AV VTI:            0.800 m AV Peak Grad:      57.8 mmHg AV Mean Grad:      34.0 mmHg LVOT Vmax:         88.40 cm/s LVOT Vmean:        65.700 cm/s LVOT VTI:          0.189 m LVOT/AV VTI ratio: 0.24 AI PHT:            231 msec  AORTA Ao Root diam: 3.00 cm MITRAL VALVE                         TRICUSPID VALVE MV Area (PHT): 5.13 cm              TR Peak grad:   30.7 mmHg MV PHT:        42.92 msec            TR Vmax:        299.00 cm/s MV Decel Time: 148 msec MR Peak grad: 114.9 mmHg             SHUNTS MR Mean grad: 69.0 mmHg              Systemic VTI:  0.19 m MR Vmax:      536.00 cm/s            Systemic Diam: 2.00 cm MR Vmean:     385.0 cm/s MV E velocity: 121.00 cm/s 103 cm/s  MV A velocity: 79.30 cm/s  70.3 cm/s MV E/A ratio:  1.53        1.5  Dorris Carnes MD Electronically signed by Dorris Carnes MD Signature Date/Time: 12/09/2019/3:49:31 PM    Final     Microbiology No results found for this or any previous visit (from the past 240 hour(s)).  Lab Basic Metabolic Panel: Recent Labs  Lab 12/14/19 0516 12/15/19 0322 12/15/19 1554 12/15/19 1930 12/15/19 2056 12/16/19 0405 12/16/19 1800 2020-01-15 0418  NA 134*  --  133* 131* 131* 134* 134* 134*  K 4.7  --  6.3* 6.1* 5.9* 5.6* 4.1 4.0  CL 101  --  103 100  --  103 103 105  CO2 25  --  21* 20*  --  21* 22 16*  GLUCOSE 110*  --  257* 392*  --  198* 143* 83  BUN 53*  --  36* 34*  --  40* 30* 24*  CREATININE 1.57*  --  0.91 1.03  --  1.16 0.97 0.84  CALCIUM 7.7*  --  7.3* 8.3*  --  7.4* 7.5* 7.5*  MG 2.8* 2.6*  --  2.5*  --   2.5*  --  2.5*  PHOS 3.2  --  3.6 4.8*  --  4.2 3.0 3.5   Liver Function Tests: Recent Labs  Lab 12/11/19 0221 12/12/19 0338 12/15/19 0322 12/15/19 1554 12/15/19 1930 12/16/19 0405 12/16/19 1800 01/15/2020 0418  AST 61* 42* 32  --  42*  --   --   --   ALT 129* 93* 44  --  41  --   --   --   ALKPHOS 116 107 82  --  75  --   --   --   BILITOT 2.3* 2.9* 2.6*  --  2.3*  --   --   --   PROT 5.2* 5.9* 4.3*  --  3.5*  --   --   --   ALBUMIN 2.3* 3.2* 1.9* 1.6* 1.5* 1.8* 1.8* 1.8*   No results for input(s): LIPASE, AMYLASE in the last 168 hours. No results for input(s): AMMONIA in the last 168 hours. CBC: Recent Labs  Lab 12/15/19 0322 12/15/19 1930 12/15/19 2054 12/15/19 2056 12/16/19 0405 01-15-20 0418  WBC 19.8* 23.4* 23.0*  --  20.0* 28.3*  HGB 7.8* 5.9* 6.3* 6.5* 8.5* 8.6*  HCT 24.0* 19.9* 20.8* 19.0* 26.8* 27.0*  MCV 95.6 105.3* 101.0*  --  95.0 96.8  PLT 77* 62* 71*  --  62* 30*   Cardiac Enzymes: No results for input(s): CKTOTAL, CKMB, CKMBINDEX, TROPONINI in the last 168 hours. Sepsis Labs: Recent Labs  Lab 12/15/19 1930 12/15/19 2054 12/15/19 2341 12/16/19 0405 12/16/19 0526 Jan 15, 2020 0418  WBC 23.4* 23.0*  --  20.0*  --  28.3*  LATICACIDVEN 10.5*  --  10.2*  --  3.4*  --     Procedures/Operations     Audria Nine 2020-01-15, 6:14 PM

## 2019-12-27 NOTE — Progress Notes (Addendum)
Pt passed away at 1715 with family at bedside.

## 2019-12-27 NOTE — Progress Notes (Addendum)
Paged nephro to give them an update.   02-Jan-2020 2:55 AM - paged nephro again   2020-01-02 2:57 AM - nephro called... updated them.... Instructions were to keep pt even.

## 2019-12-27 NOTE — Progress Notes (Signed)
eLink Physician-Brief Progress Note Patient Name: [name] DOB: May 24, 1951 MRN: [id number]   Date of Service  12-18-19  HPI/Events of Note  Coffee ground emesis - Patient on Heparin subcutaneous.   eICU Interventions  Will order: 1. Stop Heparin subcutaneous. 2. Place and maintain SCD's.     Intervention Category Major Interventions: Other:  Elmer Boutelle Cornelia Copa 2019-12-18, 1:12 AM

## 2019-12-27 NOTE — Progress Notes (Signed)
PT Cancellation Note  Patient Details Name: [name] MRN: [id number] DOB: 07-27-1951   Cancelled Treatment:    Reason Eval/Treat Not Completed: Medical issues which prohibited therapy;Patient not medically ready Pt with cardiac arrest 12/20 and now on 3 pressors with MAP in 50s. PT will continue to follow acutely.   Earney Navy, PTA Acute Rehabilitation Services Pager: [phone] Office: [phone]   December 19, 2019, 1:12 PM

## 2019-12-27 NOTE — Progress Notes (Signed)
Called wife and updated her.... Sts she will be coming in soon.

## 2019-12-27 NOTE — Progress Notes (Signed)
NAME:  [name], MRN:  [id number], DOB:  March 06, 1951, LOS: 12 ADMISSION DATE:  12/19/2019, CONSULTATION DATE:  12/07/2019 REFERRING MD:  TRH, CHIEF COMPLAINT: Altered mental status  Brief History   69 yo male with extensive PMH as below admitted from [facility] on 12/11 in septic shock and multiorgan failure.  Required intubation and CRRT.  Rapid improvement, extubated 12/13 and CRRT stopped 12/14.   Significant Hospital Events   12/10 > Admitted to [facility] 12/11 > Transferred to [facility] 12/12 > Initiated CVVH, off pressors 12/13 > extubated  12/14 > stopped CRRT 12/16 > to start iHD,did not tolerate from a hemodynamics standpoint.  12/18 > back on CRRT, Levophed Restarted on Levophed this AM. Awaiting blood transfusion for hgb 6.1. No complaints. 12/19  - pall care cnsult - full code + /sp 1 unit prbc overnight. Back on CRRT . Remains extubaed. Wife giving him water. Denied any questions.  December 26, 2019 now a DNR.  On epinephrine drip.  Family coming in to visit.  Consults:  Nephrology PCCM Cardiology Palliative  Procedures:  ETT 12/11 >> 12/13 Right femoral CVL 12/11 >> Right IJ HD catheter 12/12 >>  Significant Diagnostic Tests:  12/10 [facility] >>chronic small vessel disease without acute intracranial abnormality 12/10 CT a/p >>  Mild gastric distension with air-fluid level, can be seen with gastroenteritis or gastroparesis. No gastric wall thickening or evidence of gastric outlet obstruction. Small volume of ascitesin the abdomen and pelvis, new from prior CT. Mild body wall edema. Large stool burden with colonic tortuosity, suggesting constipation. No bowel obstruction. Bilateral pleural effusions. Irregular streaky consolidative airspace opacities in both lower lobes, increased from CT last month. Pneumonia or aspiration considered. Severe aortic and branch atherosclerosis. Aortic Atherosclerosis   Micro Data:  12/10 SARS2 >> neg 12/11 MRSA PCR >> neg 12/11 RVP >> neg 12/10 UC  >> neg 12/10 BCx 2 >> neg 12/12 trach asp >> MRSA and pseudomonas AFB 12/12 neg Fungal culture 12/12 >>>  Antimicrobials:  Ceftriaxone 12/10 >>12/11 Doxycycline 12/10  >> 12/11 Meropenem 12/12 >>12/14 Anidulafungin 1 dose 12/11 Cefepime 12/15  Vancomycin 12/11   Interim history/subjective:  Now a DNR\ Epinephrine drip 63 O'clock family to visit due to poor prognosis Objective   Blood pressure (!) 69/39, pulse 79, temperature (!) 96.5 F (35.8 C), temperature source Axillary, resp. rate (!) 23, height 5\' 8"  (1.727 m), weight 79.9 kg, SpO2 100 %.    Vent Mode: PRVC FiO2 (%):  [30 %] 30 % Set Rate:  [16 bmp] 16 bmp Vt Set:  [540 mL] 540 mL PEEP:  [5 cmH20] 5 cmH20 Plateau Pressure:  [17 cmH20-24 cmH20] 19 cmH20   Intake/Output Summary (Last 24 hours) at December 26, 2019 0902 Last data filed at December 26, 2019 0700 Gross per 24 hour  Intake 2858.95 ml  Output 3018 ml  Net -159.05 ml   Filed Weights   12/15/19 0500 12/16/19 0411 December 26, 2019 0500  Weight: 78.8 kg 82 kg 79.9 kg    .General Appearance:  Frail, deconditioned, critically ill appearing older adult M.  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 Carlis Abbott PSA, MRSA PNA as above  P Continue vent support To unstable for spontaneous breathing trials Sedate for comfort  AKI (likely a mix of cardiorenal syndrome and ATN) P CRRT per nephrology  RA: on plaquenil and chronic pred Stress dose steroids Continue narcotics  Anemia of chronic disease  Recent Labs    12/16/19 0405 January 14, 2020 0418  HGB 8.5* 8.6*    Thrombocytopenia P Monitor and treat as needed  DM w/ hyperglycemia  Scale insulin protocol D5W for low glucose  Goals of care: Continuing current interventions Now a DNR Family coming to bedside   Best practice:  Diet: EN  Pain/Anxiety/Delirium protocol (if indicated): chronic methadone, fentanyl with PAD  VAP protocol (if indicated): Yes  DVT prophylaxis: SQH GI prophylaxis: Yes Glucose control: SSI Mobility: PT/ OT Code Status: Full Family Communication: 2020/01/14 wife updated at bedside.  She is requesting family commended of his acute status and poor survivability.  This has been granted. Disposition: ICU     LABS    PULMONARY Recent Labs  Lab 12/13/19 1415 12/15/19 2056  PHART  --  7.484*  PCO2ART  --  29.9*  PO2ART  --  513.0*  HCO3 21.5 22.6  TCO2 23 24  O2SAT 54.0 100.0    CBC Recent Labs  Lab 12/15/19 2054 12/15/19 2056 12/16/19 0405 01-14-20 0418  HGB 6.3* 6.5* 8.5* 8.6*  HCT 20.8* 19.0* 26.8* 27.0*  WBC 23.0*  --  20.0* 28.3*  PLT 71*  --  62* 30*    COAGULATION No results for input(s): INR in the last 168 hours.  CARDIAC  No results for input(s): TROPONINI in the last 168 hours. No results for input(s): PROBNP in the last 168 hours.   CHEMISTRY Recent Labs  Lab 12/14/19 0516 12/15/19 0322 12/15/19 1554 12/15/19 1930 12/15/19 2056 12/16/19 0405 12/16/19 1800 01-14-20 0418  NA 134*  --  133* 131* 131* 134* 134* 134*  K 4.7  --  6.3* 6.1* 5.9* 5.6* 4.1  4.0  CL 101  --  103 100  --  103 103 105  CO2 25  --  21* 20*  --  21* 22 16*  GLUCOSE 110*  --  257* 392*  --  198* 143* 83  BUN 53*  --  36* 34*  --  40* 30* 24*  CREATININE 1.57*  --  0.91 1.03  --  1.16 0.97 0.84  CALCIUM 7.7*  --  7.3* 8.3*  --  7.4* 7.5* 7.5*  MG 2.8* 2.6*  --  2.5*  --  2.5*  --  2.5*  PHOS 3.2  --  3.6 4.8*  --  4.2 3.0 3.5   Estimated Creatinine Clearance: 79.2 mL/min (by C-G formula based on SCr of 0.84 mg/dL).   LIVER Recent Labs  Lab 12/11/19 0221 12/12/19 0338 12/15/19 0322 12/15/19 1554 12/15/19 1930 12/16/19 0405 12/16/19 1800 2020/01/14 0418  AST 61* 42* 32  --  42*  --   --   --   ALT 129* 93* 44  --  41  --   --   --  ALKPHOS 116 107 82  --  75  --   --   --   BILITOT 2.3* 2.9* 2.6*  --  2.3*  --   --   --   PROT 5.2* 5.9* 4.3*  --  3.5*  --   --   --   ALBUMIN 2.3* 3.2* 1.9* 1.6* 1.5* 1.8* 1.8* 1.8*     INFECTIOUS Recent Labs  Lab 12/15/19 1930 12/15/19 2341 12/16/19 0526  LATICACIDVEN 10.5* 10.2* 3.4*     ENDOCRINE CBG (last 3)  Recent Labs    2019/12/25 0022 25-Dec-2019 0422 12/25/19 0757  GLUCAP 108* 81 14*    App cct 30 min   Richardson Landry Davon Folta ACNP Acute Care Nurse Practitioner [place] Please consult Amion Dec 25, 2019, 9:02 AM

## 2019-12-27 NOTE — Progress Notes (Signed)
Wasted 139ml Fentanyl in sink with Bo Merino, RN

## 2019-12-27 DEATH — deceased

## 2020-01-07 LAB — FUNGAL ORGANISM REFLEX

## 2020-01-07 LAB — FUNGUS CULTURE RESULT

## 2020-01-07 LAB — FUNGUS CULTURE WITH STAIN

## 2020-01-21 LAB — ACID FAST CULTURE WITH REFLEXED SENSITIVITIES (MYCOBACTERIA): Acid Fast Culture: NEGATIVE

## 2020-01-27 ENCOUNTER — Encounter: Payer: Medicare HMO | Admitting: Internal Medicine

## 2021-07-30 LAB — ACID FAST SMEAR (AFB, MYCOBACTERIA): Acid Fast Smear: NEGATIVE

## 2021-09-24 IMAGING — CT CT CTA ABD/PEL W/CM AND/OR W/O CM
2 of 7 series · 16 of 36 positions shown · IV contrast (APPLIED)
Comparison: 10/10/2019 chest CT.  10/15/2016 CT abdomen/pelvis.

CLINICAL DATA: Inpatient. Severe symptomatic aortic stenosis.
Pre-TAVR evaluation.

EXAM:
CT ANGIOGRAPHY CHEST, ABDOMEN AND PELVIS
TECHNIQUE: Multidetector CT imaging through the chest, abdomen and pelvis was
performed using the standard protocol during bolus administration of
intravenous contrast. Multiplanar reconstructed images and MIPs were
obtained and reviewed to evaluate the vascular anatomy.
CONTRAST:  95mL OMNIPAQUE IOHEXOL 350 MG/ML SOLN

[Series 6: ax thins · axial · 0.59mm/px · z∈[+638,+1252]mm · 15 of 692 slices shown]
[im 39/692  lung]
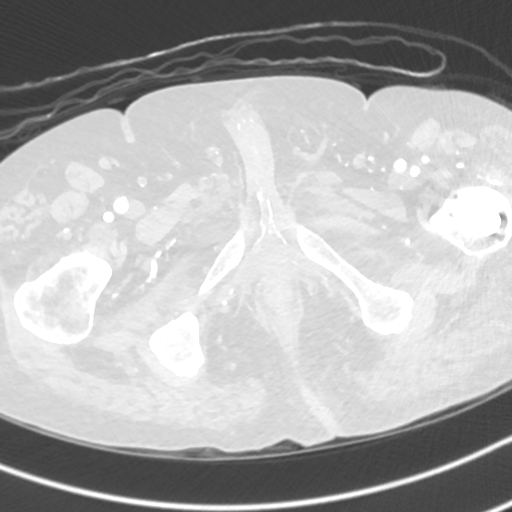
[im 77/692  mediastinal]
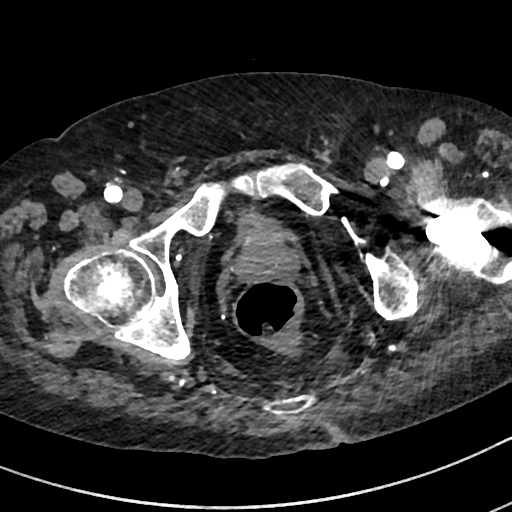
[im 116/692  lung]
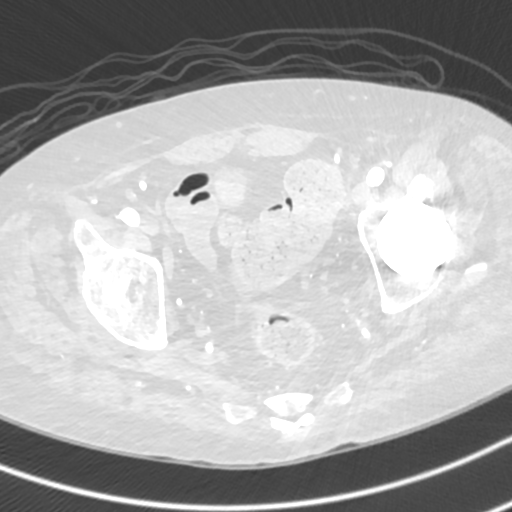
[im 154/692  mediastinal]
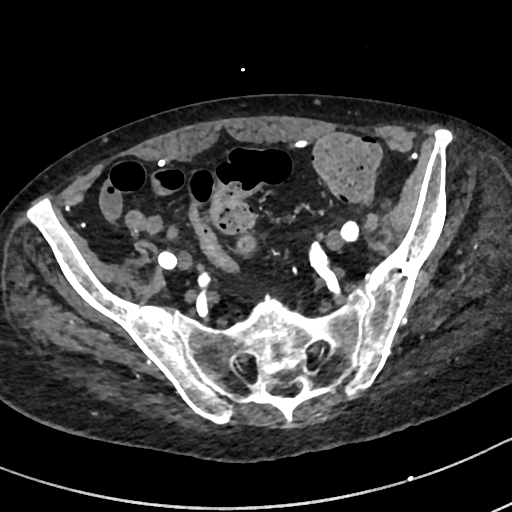
[im 231/692  lung]
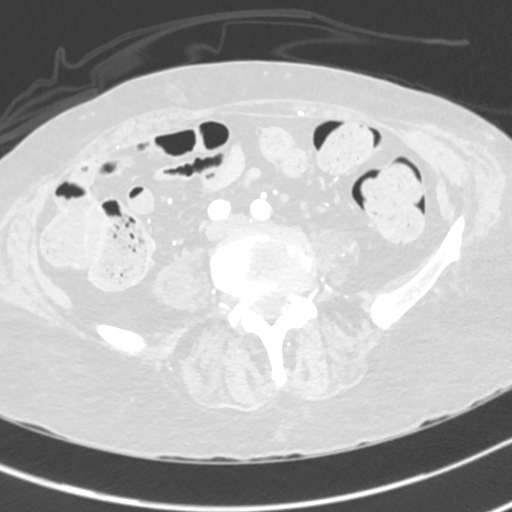
[im 269/692  mediastinal]
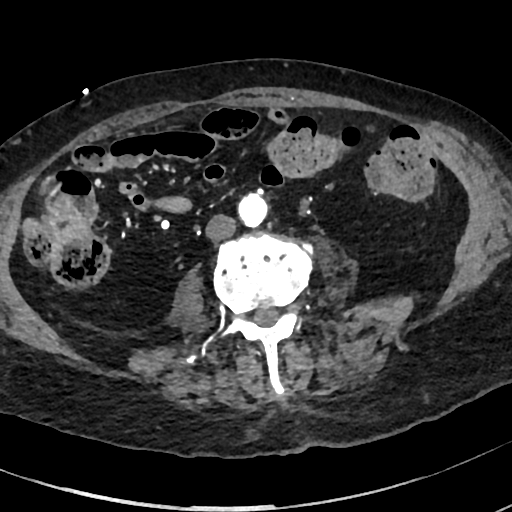
[im 308/692  lung]
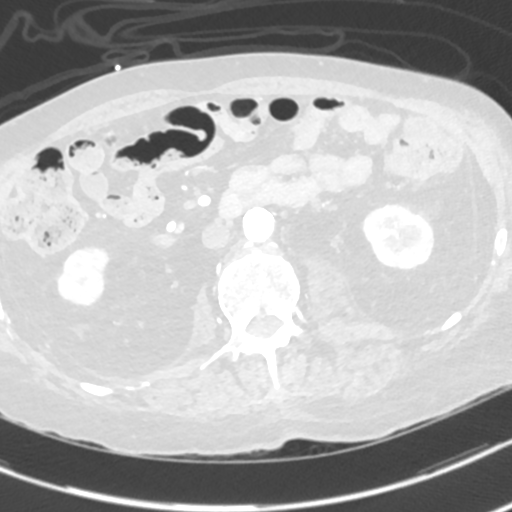
[im 346/692  mediastinal]
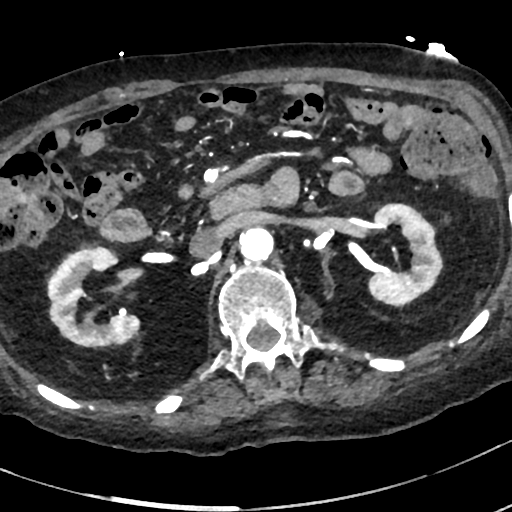
[im 384/692  lung]
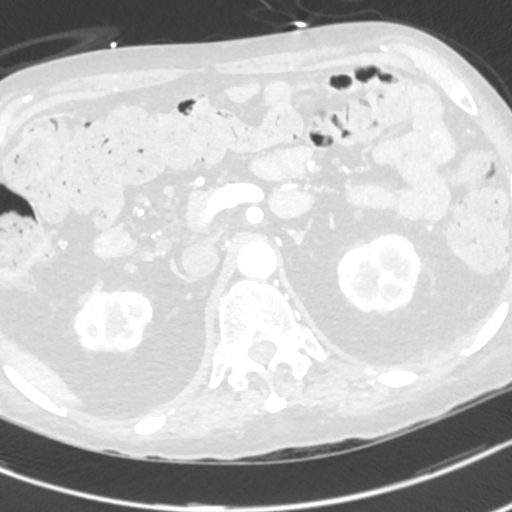
[im 423/692  mediastinal]
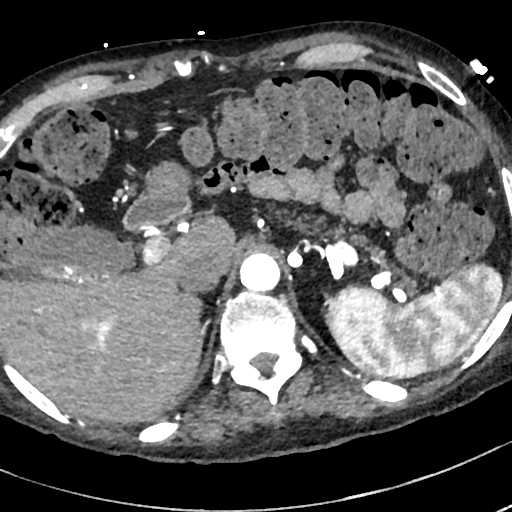
[im 461/692  lung]
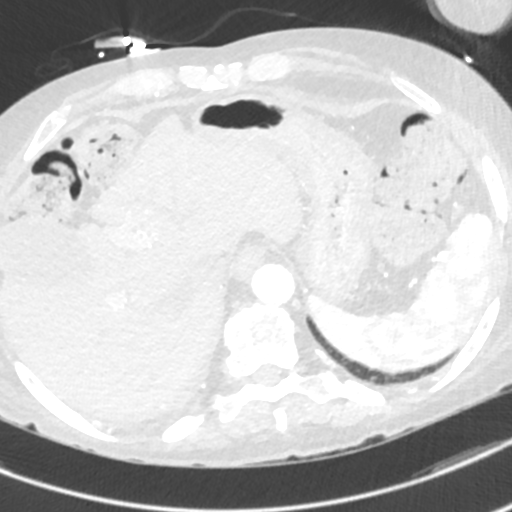
[im 538/692  mediastinal]
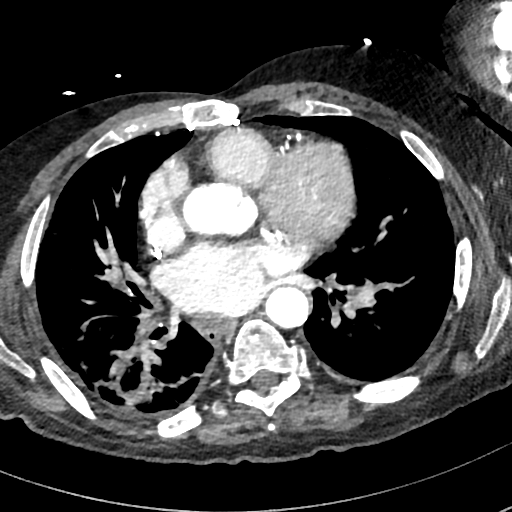
[im 576/692  lung]
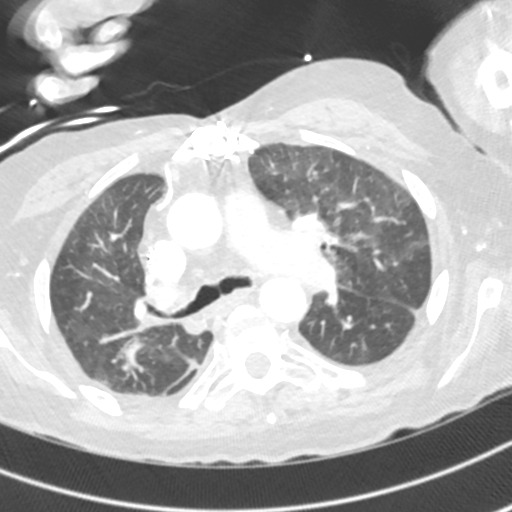
[im 615/692  mediastinal]
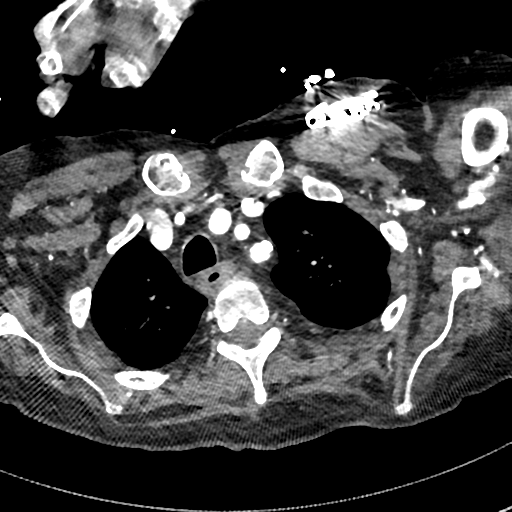
[im 653/692  lung]
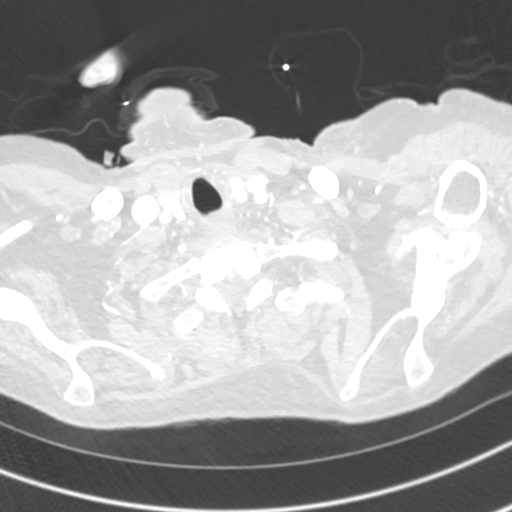

[Series 9: cor · coronal · 0.82mm/px · 1 of 121 slices shown]
[im 61/121  mediastinal]
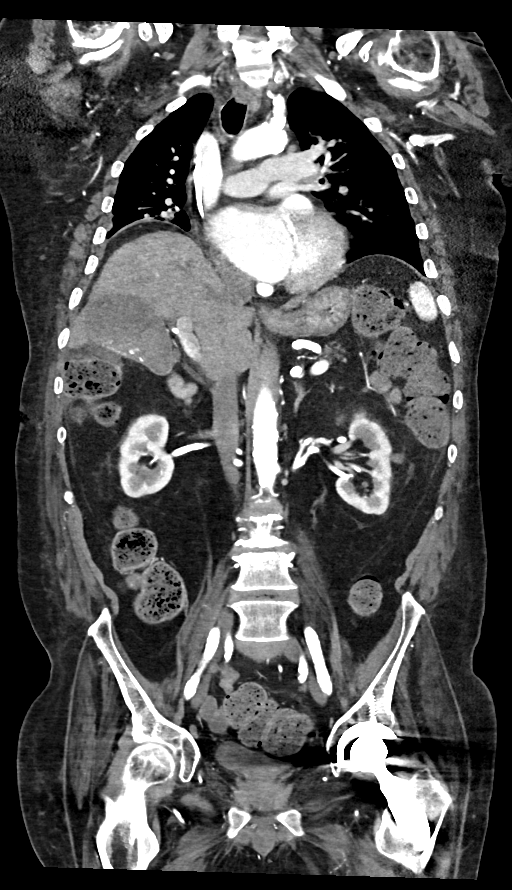

[16 of 36 positions shown; findings below may reference images not displayed]

FINDINGS: CTA CHEST FINDINGS

Cardiovascular: Mild cardiomegaly. No significant pericardial
effusion/thickening. Marked thickening and calcification of the
aortic valve. 2 lead left subclavian pacemaker with lead tips in
right atrium and right ventricle. Three-vessel coronary
atherosclerosis status post CABG. Atherosclerotic nonaneurysmal
thoracic aorta. Normal caliber pulmonary arteries. No central
pulmonary emboli.

Mediastinum/Nodes: No discrete thyroid nodules. Unremarkable
esophagus. No pathologically enlarged axillary, mediastinal or hilar
lymph nodes.

Lungs/Pleura: No pneumothorax. No left pleural effusion. Trace
dependent right pleural effusion with smooth mild right pleural
thickening and scattered right pleural calcification, unchanged. Low
lung volumes. Stable mild elevation of the right hemidiaphragm.
Thick platelike consolidation with associated volume loss in the
dependent basilar right lower lobe, not appreciably changed back to
10/16/2016 chest CT, compatible with nonspecific
postinfectious/postinflammatory scarring. No acute consolidative
airspace disease, lung masses or significant pulmonary nodules. Mild
mosaic attenuation throughout both lungs.

Musculoskeletal: No aggressive appearing focal osseous lesions.
Intact sternotomy wires. Severe bilateral glenohumeral
osteoarthritis. Mild thoracic spondylosis.

CTA ABDOMEN AND PELVIS FINDINGS

Hepatobiliary: Normal liver with no liver mass. Cholelithiasis. No
biliary ductal dilatation.

Pancreas: Normal, with no mass or duct dilation.

Spleen: Normal size. No mass.

Adrenals/Urinary Tract: Normal adrenals. No hydronephrosis.
Exophytic hypodense 1.5 cm renal cortical lesion in the interpolar
left kidney (series 7/ image 118), stable in size since 10/15/2016,
presumably a Bosniak category 2 renal cyst. No additional contour
deforming renal masses. Bladder obscured by streak artifact from
left hip hardware. No significant bladder distention. Chronic
diffuse bladder wall thickening is unchanged.

Stomach/Bowel: Normal non-distended stomach. Normal caliber small
bowel with no small bowel wall thickening. Normal appendix. Normal
large bowel with no diverticulosis, large bowel wall thickening or
pericolonic fat stranding.

Vascular/Lymphatic: Atherosclerotic nonaneurysmal abdominal aorta.
Patent renal and splenic veins. No pathologically enlarged lymph
nodes in the abdomen or pelvis.

Reproductive: Top-normal size prostate.

Other: No pneumoperitoneum, ascites or focal fluid collection. Small
fat containing periumbilical hernia, unchanged.

Musculoskeletal: No aggressive appearing focal osseous lesions. Left
total hip arthroplasty. Marked lumbar spondylosis, most prominent at
L3-4.

VASCULAR MEASUREMENTS PERTINENT TO TAVR:

AORTA:

Minimal Aortic 2iameter-VY.M x 11.9 mm

Severity of Aortic Calcification-moderate to severe

RIGHT PELVIS:

Right Common Iliac Artery -

Minimal Fiameter-3.H x 8.0 mm

Tortuosity-mild

Calcification-moderate

Right External Iliac Artery -

Minimal Miameter-C.C x 7.1 mm

Tortuosity-mild-to-moderate

Calcification-mild

Right Common Femoral Artery -

Minimal Oiameter-R.P x 5.7 mm

Tortuosity-mild

Calcification-moderate

LEFT PELVIS:

Left Common Iliac Artery -

Minimal Qiameter-8.S x 7.3 mm

Tortuosity-mild

Calcification-moderate to severe

Left External Iliac Artery -

Minimal Giameter-I.C x 6.6 mm

Tortuosity-mild-to-moderate

Calcification-mild

Left Common Femoral Artery -

Minimal 9iameter-0.Y x 5.8 mm

Tortuosity-mild

Calcification-moderate

Review of the MIP images confirms the above findings.
IMPRESSION: 1. Vascular findings and measurements pertinent to potential TAVR
procedure, as detailed.
2. Marked thickening and calcification of the aortic valve,
compatible with the reported history of severe symptomatic aortic
stenosis.
3. Cardiomegaly.
4. Chronic trace loculated dependent right pleural effusion with
smooth right pleural thickening and platelike
postinfectious/postinflammatory scarring at the dependent right lung
base.
5. Cholelithiasis.
6. Chronic diffuse bladder wall thickening, nonspecific. No
hydronephrosis.
7. Aortic Atherosclerosis ([id number]).

## 2021-12-28 NOTE — Telephone Encounter (Signed)
Entered in error
